# Patient Record
Sex: Male | Born: 1945 | Race: Black or African American | Hispanic: No | Marital: Married | Smoking: Former smoker
Health system: Southern US, Community
[De-identification: ages and names within clinical notes are randomized; demographics above are authoritative.]

## PROBLEM LIST (undated history)

## (undated) DIAGNOSIS — M1990 Unspecified osteoarthritis, unspecified site: Secondary | ICD-10-CM

## (undated) DIAGNOSIS — Z992 Dependence on renal dialysis: Secondary | ICD-10-CM

## (undated) DIAGNOSIS — G629 Polyneuropathy, unspecified: Secondary | ICD-10-CM

## (undated) DIAGNOSIS — H9190 Unspecified hearing loss, unspecified ear: Secondary | ICD-10-CM

## (undated) DIAGNOSIS — E119 Type 2 diabetes mellitus without complications: Secondary | ICD-10-CM

## (undated) DIAGNOSIS — D649 Anemia, unspecified: Secondary | ICD-10-CM

## (undated) DIAGNOSIS — I10 Essential (primary) hypertension: Secondary | ICD-10-CM

## (undated) DIAGNOSIS — K589 Irritable bowel syndrome without diarrhea: Secondary | ICD-10-CM

## (undated) DIAGNOSIS — N189 Chronic kidney disease, unspecified: Secondary | ICD-10-CM

## (undated) DIAGNOSIS — R0600 Dyspnea, unspecified: Secondary | ICD-10-CM

## (undated) DIAGNOSIS — I639 Cerebral infarction, unspecified: Secondary | ICD-10-CM

## (undated) DIAGNOSIS — C679 Malignant neoplasm of bladder, unspecified: Secondary | ICD-10-CM

## (undated) DIAGNOSIS — N186 End stage renal disease: Secondary | ICD-10-CM

## (undated) DIAGNOSIS — K219 Gastro-esophageal reflux disease without esophagitis: Secondary | ICD-10-CM

## (undated) DIAGNOSIS — C8590 Non-Hodgkin lymphoma, unspecified, unspecified site: Secondary | ICD-10-CM

## (undated) HISTORY — DX: Non-Hodgkin lymphoma, unspecified, unspecified site: C85.90

## (undated) HISTORY — DX: Malignant neoplasm of bladder, unspecified: C67.9

## (undated) HISTORY — DX: Chronic kidney disease, unspecified: N18.9

## (undated) HISTORY — DX: Unspecified osteoarthritis, unspecified site: M19.90

## (undated) HISTORY — PX: CIRCUMCISION: SUR203

## (undated) HISTORY — DX: Type 2 diabetes mellitus without complications: E11.9

## (undated) HISTORY — DX: Essential (primary) hypertension: I10

---

## 1997-10-09 ENCOUNTER — Ambulatory Visit (HOSPITAL_COMMUNITY): Admission: RE | Admit: 1997-10-09 | Discharge: 1997-10-09 | Payer: Self-pay | Admitting: Neurosurgery

## 1999-04-04 DIAGNOSIS — I10 Essential (primary) hypertension: Secondary | ICD-10-CM | POA: Insufficient documentation

## 1999-04-04 DIAGNOSIS — E1129 Type 2 diabetes mellitus with other diabetic kidney complication: Secondary | ICD-10-CM | POA: Insufficient documentation

## 1999-04-04 DIAGNOSIS — E1165 Type 2 diabetes mellitus with hyperglycemia: Secondary | ICD-10-CM

## 1999-10-26 DIAGNOSIS — E11319 Type 2 diabetes mellitus with unspecified diabetic retinopathy without macular edema: Secondary | ICD-10-CM

## 2005-03-08 ENCOUNTER — Ambulatory Visit: Payer: Self-pay | Admitting: Unknown Physician Specialty

## 2005-04-03 HISTORY — PX: BACK SURGERY: SHX140

## 2005-07-10 ENCOUNTER — Other Ambulatory Visit: Payer: Self-pay

## 2005-07-10 ENCOUNTER — Inpatient Hospital Stay: Payer: Self-pay | Admitting: Internal Medicine

## 2005-08-14 ENCOUNTER — Ambulatory Visit: Payer: Self-pay | Admitting: Unknown Physician Specialty

## 2005-08-22 ENCOUNTER — Inpatient Hospital Stay: Payer: Self-pay | Admitting: Unknown Physician Specialty

## 2011-12-25 DIAGNOSIS — E785 Hyperlipidemia, unspecified: Secondary | ICD-10-CM | POA: Insufficient documentation

## 2012-01-01 DIAGNOSIS — R809 Proteinuria, unspecified: Secondary | ICD-10-CM | POA: Insufficient documentation

## 2012-05-30 ENCOUNTER — Ambulatory Visit: Payer: Self-pay | Admitting: Vascular Surgery

## 2012-05-30 DIAGNOSIS — I10 Essential (primary) hypertension: Secondary | ICD-10-CM

## 2012-05-30 LAB — BASIC METABOLIC PANEL
Anion Gap: 7 (ref 7–16)
Co2: 22 mmol/L (ref 21–32)
Creatinine: 5.41 mg/dL — ABNORMAL HIGH (ref 0.60–1.30)
EGFR (Non-African Amer.): 10 — ABNORMAL LOW
Glucose: 118 mg/dL — ABNORMAL HIGH (ref 65–99)
Osmolality: 293 (ref 275–301)
Sodium: 138 mmol/L (ref 136–145)

## 2012-05-30 LAB — CBC
HGB: 10.5 g/dL — ABNORMAL LOW (ref 13.0–18.0)
MCH: 32.4 pg (ref 26.0–34.0)
MCHC: 33.4 g/dL (ref 32.0–36.0)
MCV: 97 fL (ref 80–100)
Platelet: 143 10*3/uL — ABNORMAL LOW (ref 150–440)
RBC: 3.25 10*6/uL — ABNORMAL LOW (ref 4.40–5.90)
WBC: 7 10*3/uL (ref 3.8–10.6)

## 2012-06-05 ENCOUNTER — Ambulatory Visit: Payer: Self-pay | Admitting: Vascular Surgery

## 2012-07-18 DIAGNOSIS — D649 Anemia, unspecified: Secondary | ICD-10-CM | POA: Insufficient documentation

## 2012-07-18 DIAGNOSIS — E213 Hyperparathyroidism, unspecified: Secondary | ICD-10-CM | POA: Insufficient documentation

## 2012-07-18 DIAGNOSIS — E872 Acidosis: Secondary | ICD-10-CM | POA: Insufficient documentation

## 2012-12-23 DIAGNOSIS — N186 End stage renal disease: Secondary | ICD-10-CM | POA: Insufficient documentation

## 2012-12-23 DIAGNOSIS — Z992 Dependence on renal dialysis: Secondary | ICD-10-CM

## 2013-01-01 ENCOUNTER — Ambulatory Visit: Payer: Self-pay

## 2013-01-20 ENCOUNTER — Ambulatory Visit: Payer: Self-pay

## 2013-01-20 ENCOUNTER — Ambulatory Visit: Payer: Self-pay | Admitting: Podiatry

## 2013-01-22 ENCOUNTER — Ambulatory Visit: Payer: Self-pay | Admitting: Internal Medicine

## 2013-01-30 ENCOUNTER — Ambulatory Visit: Payer: Self-pay | Admitting: Internal Medicine

## 2013-01-31 LAB — CBC CANCER CENTER
Eosinophil: 3 %
HCT: 34.6 % — ABNORMAL LOW (ref 40.0–52.0)
MCH: 33.1 pg (ref 26.0–34.0)
MCV: 101 fL — ABNORMAL HIGH (ref 80–100)
Platelet: 119 x10 3/mm — ABNORMAL LOW (ref 150–440)
RBC: 3.44 10*6/uL — ABNORMAL LOW (ref 4.40–5.90)
RDW: 15.7 % — ABNORMAL HIGH (ref 11.5–14.5)
Segmented Neutrophils: 38 %
WBC: 6.7 x10 3/mm (ref 3.8–10.6)

## 2013-01-31 LAB — LACTATE DEHYDROGENASE: LDH: 146 U/L (ref 85–241)

## 2013-01-31 LAB — IRON AND TIBC
Iron Bind.Cap.(Total): 265 ug/dL (ref 250–450)
Iron Saturation: 29 %
Unbound Iron-Bind.Cap.: 187 ug/dL

## 2013-01-31 LAB — PROTIME-INR
INR: 0.9
Prothrombin Time: 12.8 secs (ref 11.5–14.7)

## 2013-01-31 LAB — APTT: Activated PTT: 30.5 secs (ref 23.6–35.9)

## 2013-01-31 LAB — RETICULOCYTES
Absolute Retic Count: 0.1212 10*6/uL (ref 0.019–0.186)
Reticulocyte: 3.52 % — ABNORMAL HIGH (ref 0.4–3.1)

## 2013-01-31 LAB — FOLATE: Folic Acid: 9.7 ng/mL (ref 3.1–100.0)

## 2013-02-01 ENCOUNTER — Ambulatory Visit: Payer: Self-pay | Admitting: Internal Medicine

## 2013-02-05 ENCOUNTER — Encounter: Payer: Self-pay | Admitting: Podiatry

## 2013-02-05 ENCOUNTER — Ambulatory Visit (INDEPENDENT_AMBULATORY_CARE_PROVIDER_SITE_OTHER): Payer: Medicare PPO | Admitting: Podiatry

## 2013-02-05 VITALS — BP 137/67 | HR 77 | Resp 16 | Ht 71.0 in | Wt 190.0 lb

## 2013-02-05 DIAGNOSIS — L97509 Non-pressure chronic ulcer of other part of unspecified foot with unspecified severity: Secondary | ICD-10-CM

## 2013-02-05 DIAGNOSIS — L97511 Non-pressure chronic ulcer of other part of right foot limited to breakdown of skin: Secondary | ICD-10-CM

## 2013-02-05 DIAGNOSIS — E1169 Type 2 diabetes mellitus with other specified complication: Secondary | ICD-10-CM

## 2013-02-05 NOTE — Progress Notes (Signed)
Mark [name] presents today for followup of the ulceration is chronic in nature sub-fifth metatarsal head of the left foot. He is also concerned about skin breakdown to the hallux distally on the right foot. He also like a suture of his nails. He has a history of diabetes as well as vascular disease and dropfoot.  Objective: Vital signs are stable he is alert and oriented x3 pulses are palpable bilateral. Superficial ulceration proximally a centimeter in diameter distal aspect of the hallux right. It appears to be a little serrated to the dermis there is mild drainage but appears to be healing quite nicely. There is no signs of infection in this site. Contralateral foot does demonstrate superficial ulceration with reactive hyperkeratosis to the plantar aspect of the substance metatarsal head of the left foot. Upon debridement of this is noted to he did have some drainage some malodor but did not probe deep. I resected all necrotic tissue the bleeding. We also debrided his nails which are thick yellow dystrophic and clinically mycotic.  Assessment: Diabetic with renal failure. Peripheral vascular disease and neuropathy. Ulceration distal aspect hallux right grade 1. Grade 2 ulceration sub-fifth metatarsal head left foot.  Plan: We discussed etiology pathology conservative versus surgical therapies. He would like [place] to refer him to the wound care clinic. We debrided his nails 1 through 5 bilateral is cover service secondary to pain. We debrided all ulcerations to bleeding. He currently he will continue conservative therapies and wound dressing to the fifth metatarsal left foot but should going to heal quite nicely. He will cover the hallux right daily with Bactroban ointment and a dry sterile dressing. I will followup with him in 2 months.

## 2013-02-10 ENCOUNTER — Encounter: Payer: Self-pay | Admitting: Cardiothoracic Surgery

## 2013-02-15 LAB — WOUND AEROBIC CULTURE

## 2013-02-17 ENCOUNTER — Ambulatory Visit: Payer: Self-pay | Admitting: Gastroenterology

## 2013-02-17 ENCOUNTER — Ambulatory Visit: Payer: Self-pay

## 2013-02-17 LAB — BASIC METABOLIC PANEL
BUN: 33 mg/dL — ABNORMAL HIGH (ref 7–18)
Calcium, Total: 9 mg/dL (ref 8.5–10.1)
Co2: 31 mmol/L (ref 21–32)
Creatinine: 5.96 mg/dL — ABNORMAL HIGH (ref 0.60–1.30)
EGFR (Non-African Amer.): 9 — ABNORMAL LOW
Glucose: 156 mg/dL — ABNORMAL HIGH (ref 65–99)
Osmolality: 286 (ref 275–301)
Potassium: 4.3 mmol/L (ref 3.5–5.1)
Sodium: 138 mmol/L (ref 136–145)

## 2013-02-21 LAB — WOUND AEROBIC CULTURE

## 2013-03-03 ENCOUNTER — Ambulatory Visit: Payer: Self-pay | Admitting: Internal Medicine

## 2013-03-03 ENCOUNTER — Encounter: Payer: Self-pay | Admitting: Cardiothoracic Surgery

## 2013-03-05 LAB — CBC CANCER CENTER
HCT: 32 % — ABNORMAL LOW (ref 40.0–52.0)
Lymphocytes: 46 %
MCH: 33.9 pg (ref 26.0–34.0)
MCV: 101 fL — ABNORMAL HIGH (ref 80–100)
RBC: 3.16 10*6/uL — ABNORMAL LOW (ref 4.40–5.90)
RDW: 14.2 % (ref 11.5–14.5)
Segmented Neutrophils: 50 %
WBC: 4.5 x10 3/mm (ref 3.8–10.6)

## 2013-03-12 LAB — CBC CANCER CENTER
Basophil %: 0.6 %
Eosinophil #: 0.1 x10 3/mm (ref 0.0–0.7)
HCT: 32.1 % — ABNORMAL LOW (ref 40.0–52.0)
HGB: 10.6 g/dL — ABNORMAL LOW (ref 13.0–18.0)
Lymphocyte #: 2.8 x10 3/mm (ref 1.0–3.6)
Lymphocyte %: 59.6 %
MCH: 33.1 pg (ref 26.0–34.0)
MCV: 101 fL — ABNORMAL HIGH (ref 80–100)
Monocyte #: 0.1 x10 3/mm — ABNORMAL LOW (ref 0.2–1.0)
Monocyte %: 2.3 %
Platelet: 91 x10 3/mm — ABNORMAL LOW (ref 150–440)
RDW: 13.9 % (ref 11.5–14.5)
WBC: 4.7 x10 3/mm (ref 3.8–10.6)

## 2013-03-24 LAB — CBC CANCER CENTER
Basophil %: 0.3 %
Eosinophil %: 1.4 %
HCT: 31.7 % — ABNORMAL LOW (ref 40.0–52.0)
HGB: 10.4 g/dL — ABNORMAL LOW (ref 13.0–18.0)
Lymphocyte #: 0.7 x10 3/mm — ABNORMAL LOW (ref 1.0–3.6)
MCH: 32.7 pg (ref 26.0–34.0)
MCHC: 32.9 g/dL (ref 32.0–36.0)
MCV: 99 fL (ref 80–100)
Monocyte #: 0.1 x10 3/mm — ABNORMAL LOW (ref 0.2–1.0)
Monocyte %: 1.3 %
Neutrophil #: 7.2 x10 3/mm — ABNORMAL HIGH (ref 1.4–6.5)
RBC: 3.19 10*6/uL — ABNORMAL LOW (ref 4.40–5.90)
RDW: 13.6 % (ref 11.5–14.5)

## 2013-03-31 LAB — CBC CANCER CENTER
Basophil %: 1.2 %
Eosinophil #: 0.1 x10 3/mm (ref 0.0–0.7)
HGB: 9.1 g/dL — ABNORMAL LOW (ref 13.0–18.0)
Lymphocyte %: 20.2 %
MCH: 33 pg (ref 26.0–34.0)
MCV: 100 fL (ref 80–100)
Monocyte #: 0.1 x10 3/mm — ABNORMAL LOW (ref 0.2–1.0)
Neutrophil #: 2.7 x10 3/mm (ref 1.4–6.5)
Platelet: 63 x10 3/mm — ABNORMAL LOW (ref 150–440)
RDW: 13.7 % (ref 11.5–14.5)
WBC: 3.6 x10 3/mm — ABNORMAL LOW (ref 3.8–10.6)

## 2013-04-03 ENCOUNTER — Ambulatory Visit: Payer: Self-pay | Admitting: Internal Medicine

## 2013-04-08 LAB — CBC CANCER CENTER
Basophil #: 0 x10 3/mm (ref 0.0–0.1)
Basophil %: 0.7 %
EOS ABS: 0.2 x10 3/mm (ref 0.0–0.7)
Eosinophil %: 5 %
HCT: 25.7 % — ABNORMAL LOW (ref 40.0–52.0)
HGB: 8.6 g/dL — AB (ref 13.0–18.0)
LYMPHS ABS: 0.8 x10 3/mm — AB (ref 1.0–3.6)
LYMPHS PCT: 20.3 %
MCH: 32.9 pg (ref 26.0–34.0)
MCHC: 33.5 g/dL (ref 32.0–36.0)
MCV: 98 fL (ref 80–100)
MONO ABS: 0.1 x10 3/mm — AB (ref 0.2–1.0)
Monocyte %: 3.3 %
NEUTROS PCT: 70.7 %
Neutrophil #: 2.8 x10 3/mm (ref 1.4–6.5)
PLATELETS: 195 x10 3/mm (ref 150–440)
RBC: 2.62 10*6/uL — ABNORMAL LOW (ref 4.40–5.90)
RDW: 13.5 % (ref 11.5–14.5)
WBC: 3.9 x10 3/mm (ref 3.8–10.6)

## 2013-04-08 LAB — URIC ACID: Uric Acid: 5.8 mg/dL (ref 3.5–7.2)

## 2013-04-08 LAB — HEPATIC FUNCTION PANEL A (ARMC)
ALBUMIN: 3.3 g/dL — AB (ref 3.4–5.0)
ALK PHOS: 74 U/L
ALT: 15 U/L (ref 12–78)
AST: 14 U/L — AB (ref 15–37)
BILIRUBIN DIRECT: 0.1 mg/dL (ref 0.00–0.20)
Bilirubin,Total: 0.3 mg/dL (ref 0.2–1.0)
TOTAL PROTEIN: 6.6 g/dL (ref 6.4–8.2)

## 2013-04-08 LAB — POTASSIUM: POTASSIUM: 4.4 mmol/L (ref 3.5–5.1)

## 2013-04-14 LAB — CBC CANCER CENTER
Basophil #: 0.1 x10 3/mm (ref 0.0–0.1)
Basophil %: 1.8 %
Eosinophil #: 0.2 x10 3/mm (ref 0.0–0.7)
Eosinophil %: 4.2 %
HCT: 25.7 % — ABNORMAL LOW (ref 40.0–52.0)
HGB: 8.7 g/dL — ABNORMAL LOW (ref 13.0–18.0)
Lymphocyte #: 1 x10 3/mm (ref 1.0–3.6)
Lymphocyte %: 26.1 %
MCH: 33 pg (ref 26.0–34.0)
MCHC: 33.8 g/dL (ref 32.0–36.0)
MCV: 97 fL (ref 80–100)
MONOS PCT: 8.5 %
Monocyte #: 0.3 x10 3/mm (ref 0.2–1.0)
Neutrophil #: 2.3 x10 3/mm (ref 1.4–6.5)
Neutrophil %: 59.4 %
PLATELETS: 198 x10 3/mm (ref 150–440)
RBC: 2.63 10*6/uL — ABNORMAL LOW (ref 4.40–5.90)
RDW: 13.8 % (ref 11.5–14.5)
WBC: 3.9 x10 3/mm (ref 3.8–10.6)

## 2013-04-21 ENCOUNTER — Ambulatory Visit: Payer: Medicare PPO | Admitting: Podiatry

## 2013-04-28 LAB — CBC CANCER CENTER
BASOS ABS: 0 x10 3/mm (ref 0.0–0.1)
BASOS PCT: 0.9 %
Eosinophil #: 0.3 x10 3/mm (ref 0.0–0.7)
Eosinophil %: 5.5 %
HCT: 28.4 % — ABNORMAL LOW (ref 40.0–52.0)
HGB: 9.5 g/dL — AB (ref 13.0–18.0)
LYMPHS ABS: 1.4 x10 3/mm (ref 1.0–3.6)
LYMPHS PCT: 30.9 %
MCH: 32.2 pg (ref 26.0–34.0)
MCHC: 33.5 g/dL (ref 32.0–36.0)
MCV: 96 fL (ref 80–100)
Monocyte #: 0.4 x10 3/mm (ref 0.2–1.0)
Monocyte %: 9.4 %
Neutrophil #: 2.5 x10 3/mm (ref 1.4–6.5)
Neutrophil %: 53.3 %
Platelet: 102 x10 3/mm — ABNORMAL LOW (ref 150–440)
RBC: 2.95 10*6/uL — ABNORMAL LOW (ref 4.40–5.90)
RDW: 13.9 % (ref 11.5–14.5)
WBC: 4.6 x10 3/mm (ref 3.8–10.6)

## 2013-05-04 ENCOUNTER — Ambulatory Visit: Payer: Self-pay | Admitting: Internal Medicine

## 2013-05-06 ENCOUNTER — Inpatient Hospital Stay: Payer: Self-pay | Admitting: Internal Medicine

## 2013-05-06 LAB — COMPREHENSIVE METABOLIC PANEL
AST: 36 U/L (ref 15–37)
Albumin: 3.6 g/dL (ref 3.4–5.0)
Alkaline Phosphatase: 198 U/L — ABNORMAL HIGH
Anion Gap: 6 — ABNORMAL LOW (ref 7–16)
BILIRUBIN TOTAL: 0.9 mg/dL (ref 0.2–1.0)
BUN: 19 mg/dL — ABNORMAL HIGH (ref 7–18)
CHLORIDE: 97 mmol/L — AB (ref 98–107)
Calcium, Total: 9.2 mg/dL (ref 8.5–10.1)
Co2: 34 mmol/L — ABNORMAL HIGH (ref 21–32)
Creatinine: 3.14 mg/dL — ABNORMAL HIGH (ref 0.60–1.30)
EGFR (Non-African Amer.): 19 — ABNORMAL LOW
GFR CALC AF AMER: 22 — AB
GLUCOSE: 146 mg/dL — AB (ref 65–99)
Osmolality: 279 (ref 275–301)
Potassium: 4 mmol/L (ref 3.5–5.1)
SGPT (ALT): 27 U/L (ref 12–78)
SODIUM: 137 mmol/L (ref 136–145)
TOTAL PROTEIN: 7.1 g/dL (ref 6.4–8.2)

## 2013-05-06 LAB — CBC
HCT: 28.1 % — ABNORMAL LOW (ref 40.0–52.0)
HGB: 9.2 g/dL — ABNORMAL LOW (ref 13.0–18.0)
MCH: 31.7 pg (ref 26.0–34.0)
MCHC: 32.9 g/dL (ref 32.0–36.0)
MCV: 96 fL (ref 80–100)
PLATELETS: 148 10*3/uL — AB (ref 150–440)
RBC: 2.91 10*6/uL — ABNORMAL LOW (ref 4.40–5.90)
RDW: 14 % (ref 11.5–14.5)
WBC: 28.5 10*3/uL — AB (ref 3.8–10.6)

## 2013-05-06 LAB — TROPONIN I: Troponin-I: 0.04 ng/mL

## 2013-05-07 LAB — BASIC METABOLIC PANEL
Anion Gap: 6 — ABNORMAL LOW (ref 7–16)
BUN: 28 mg/dL — ABNORMAL HIGH (ref 7–18)
CHLORIDE: 97 mmol/L — AB (ref 98–107)
CREATININE: 4.48 mg/dL — AB (ref 0.60–1.30)
Calcium, Total: 8.3 mg/dL — ABNORMAL LOW (ref 8.5–10.1)
Co2: 34 mmol/L — ABNORMAL HIGH (ref 21–32)
GFR CALC AF AMER: 15 — AB
GFR CALC NON AF AMER: 13 — AB
GLUCOSE: 234 mg/dL — AB (ref 65–99)
Osmolality: 287 (ref 275–301)
Potassium: 4.2 mmol/L (ref 3.5–5.1)
Sodium: 137 mmol/L (ref 136–145)

## 2013-05-07 LAB — PHOSPHORUS: PHOSPHORUS: 2.6 mg/dL (ref 2.5–4.9)

## 2013-05-07 LAB — CBC WITH DIFFERENTIAL/PLATELET
BASOS PCT: 0.2 %
Basophil #: 0.1 10*3/uL (ref 0.0–0.1)
EOS ABS: 0.2 10*3/uL (ref 0.0–0.7)
EOS PCT: 0.9 %
HCT: 24.1 % — ABNORMAL LOW (ref 40.0–52.0)
HGB: 7.8 g/dL — ABNORMAL LOW (ref 13.0–18.0)
Lymphocyte #: 0.8 10*3/uL — ABNORMAL LOW (ref 1.0–3.6)
Lymphocyte %: 3.6 %
MCH: 31.4 pg (ref 26.0–34.0)
MCHC: 32.3 g/dL (ref 32.0–36.0)
MCV: 97 fL (ref 80–100)
Monocyte #: 1.7 x10 3/mm — ABNORMAL HIGH (ref 0.2–1.0)
Monocyte %: 7.7 %
NEUTROS ABS: 19.6 10*3/uL — AB (ref 1.4–6.5)
Neutrophil %: 87.6 %
Platelet: 117 10*3/uL — ABNORMAL LOW (ref 150–440)
RBC: 2.48 10*6/uL — ABNORMAL LOW (ref 4.40–5.90)
RDW: 14.2 % (ref 11.5–14.5)
WBC: 22.4 10*3/uL — ABNORMAL HIGH (ref 3.8–10.6)

## 2013-05-08 LAB — CBC WITH DIFFERENTIAL/PLATELET
Basophil #: 0.1 10*3/uL (ref 0.0–0.1)
Basophil %: 0.4 %
Eosinophil #: 0.3 10*3/uL (ref 0.0–0.7)
Eosinophil %: 2.3 %
HCT: 20.4 % — ABNORMAL LOW (ref 40.0–52.0)
HGB: 7 g/dL — ABNORMAL LOW (ref 13.0–18.0)
Lymphocyte #: 0.8 10*3/uL — ABNORMAL LOW (ref 1.0–3.6)
Lymphocyte %: 6.3 %
MCH: 33.3 pg (ref 26.0–34.0)
MCHC: 34.4 g/dL (ref 32.0–36.0)
MCV: 97 fL (ref 80–100)
Monocyte #: 1.2 x10 3/mm — ABNORMAL HIGH (ref 0.2–1.0)
Monocyte %: 9.6 %
Neutrophil #: 10.6 10*3/uL — ABNORMAL HIGH (ref 1.4–6.5)
Neutrophil %: 81.4 %
Platelet: 97 10*3/uL — ABNORMAL LOW (ref 150–440)
RBC: 2.11 10*6/uL — ABNORMAL LOW (ref 4.40–5.90)
RDW: 14.1 % (ref 11.5–14.5)
WBC: 13 10*3/uL — ABNORMAL HIGH (ref 3.8–10.6)

## 2013-05-08 LAB — PHOSPHORUS: Phosphorus: 2.6 mg/dL (ref 2.5–4.9)

## 2013-05-09 LAB — CBC WITH DIFFERENTIAL/PLATELET
Basophil #: 0.1 10*3/uL (ref 0.0–0.1)
Basophil %: 0.5 %
Eosinophil #: 0.4 10*3/uL (ref 0.0–0.7)
Eosinophil %: 2.7 %
HCT: 27.5 % — AB (ref 40.0–52.0)
HGB: 9.6 g/dL — ABNORMAL LOW (ref 13.0–18.0)
Lymphocyte #: 0.6 10*3/uL — ABNORMAL LOW (ref 1.0–3.6)
Lymphocyte %: 4.3 %
MCH: 32.6 pg (ref 26.0–34.0)
MCHC: 34.8 g/dL (ref 32.0–36.0)
MCV: 94 fL (ref 80–100)
MONO ABS: 0.9 x10 3/mm (ref 0.2–1.0)
MONOS PCT: 6.3 %
NEUTROS ABS: 11.8 10*3/uL — AB (ref 1.4–6.5)
Neutrophil %: 86.2 %
PLATELETS: 90 10*3/uL — AB (ref 150–440)
RBC: 2.93 10*6/uL — ABNORMAL LOW (ref 4.40–5.90)
RDW: 15 % — ABNORMAL HIGH (ref 11.5–14.5)
WBC: 13.7 10*3/uL — AB (ref 3.8–10.6)

## 2013-05-11 LAB — CULTURE, BLOOD (SINGLE)

## 2013-05-13 LAB — CBC CANCER CENTER
BASOS PCT: 0.5 %
Basophil #: 0.1 x10 3/mm (ref 0.0–0.1)
EOS PCT: 2.2 %
Eosinophil #: 0.3 x10 3/mm (ref 0.0–0.7)
HCT: 30.5 % — ABNORMAL LOW (ref 40.0–52.0)
HGB: 10.1 g/dL — AB (ref 13.0–18.0)
Lymphocyte #: 0.8 x10 3/mm — ABNORMAL LOW (ref 1.0–3.6)
Lymphocyte %: 6.5 %
MCH: 31.2 pg (ref 26.0–34.0)
MCHC: 33.2 g/dL (ref 32.0–36.0)
MCV: 94 fL (ref 80–100)
MONO ABS: 0.6 x10 3/mm (ref 0.2–1.0)
Monocyte %: 4.6 %
NEUTROS ABS: 10.3 x10 3/mm — AB (ref 1.4–6.5)
Neutrophil %: 86.2 %
Platelet: 159 x10 3/mm (ref 150–440)
RBC: 3.25 10*6/uL — ABNORMAL LOW (ref 4.40–5.90)
RDW: 14.6 % — ABNORMAL HIGH (ref 11.5–14.5)
WBC: 12 x10 3/mm — ABNORMAL HIGH (ref 3.8–10.6)

## 2013-05-21 ENCOUNTER — Ambulatory Visit: Payer: Medicare PPO | Admitting: Podiatry

## 2013-05-23 LAB — HEPATIC FUNCTION PANEL A (ARMC)
ALBUMIN: 3.9 g/dL (ref 3.4–5.0)
Alkaline Phosphatase: 84 U/L
BILIRUBIN DIRECT: 0.1 mg/dL (ref 0.00–0.20)
BILIRUBIN TOTAL: 0.3 mg/dL (ref 0.2–1.0)
SGOT(AST): 25 U/L (ref 15–37)
SGPT (ALT): 43 U/L (ref 12–78)
Total Protein: 6.9 g/dL (ref 6.4–8.2)

## 2013-05-23 LAB — CBC CANCER CENTER
BASOS ABS: 0.1 x10 3/mm (ref 0.0–0.1)
BASOS PCT: 1.1 %
Eosinophil #: 0.4 x10 3/mm (ref 0.0–0.7)
Eosinophil %: 7.3 %
HCT: 35.8 % — ABNORMAL LOW (ref 40.0–52.0)
HGB: 11.7 g/dL — ABNORMAL LOW (ref 13.0–18.0)
Lymphocyte #: 0.9 x10 3/mm — ABNORMAL LOW (ref 1.0–3.6)
Lymphocyte %: 16.7 %
MCH: 30.7 pg (ref 26.0–34.0)
MCHC: 32.7 g/dL (ref 32.0–36.0)
MCV: 94 fL (ref 80–100)
Monocyte #: 0.7 x10 3/mm (ref 0.2–1.0)
Monocyte %: 12.6 %
NEUTROS ABS: 3.6 x10 3/mm (ref 1.4–6.5)
NEUTROS PCT: 62.3 %
Platelet: 135 x10 3/mm — ABNORMAL LOW (ref 150–440)
RBC: 3.81 10*6/uL — ABNORMAL LOW (ref 4.40–5.90)
RDW: 14.5 % (ref 11.5–14.5)
WBC: 5.7 x10 3/mm (ref 3.8–10.6)

## 2013-05-23 LAB — URIC ACID: Uric Acid: 4.7 mg/dL (ref 3.5–7.2)

## 2013-05-23 LAB — POTASSIUM: Potassium: 5 mmol/L (ref 3.5–5.1)

## 2013-05-26 LAB — CBC CANCER CENTER
Basophil #: 0 x10 3/mm (ref 0.0–0.1)
Basophil %: 0.6 %
EOS ABS: 0.5 x10 3/mm (ref 0.0–0.7)
EOS PCT: 10.2 %
HCT: 32.9 % — ABNORMAL LOW (ref 40.0–52.0)
HGB: 10.9 g/dL — ABNORMAL LOW (ref 13.0–18.0)
Lymphocyte #: 0.8 x10 3/mm — ABNORMAL LOW (ref 1.0–3.6)
Lymphocyte %: 15 %
MCH: 30.9 pg (ref 26.0–34.0)
MCHC: 33.1 g/dL (ref 32.0–36.0)
MCV: 93 fL (ref 80–100)
Monocyte #: 0.6 x10 3/mm (ref 0.2–1.0)
Monocyte %: 11.9 %
NEUTROS ABS: 3.3 x10 3/mm (ref 1.4–6.5)
Neutrophil %: 62.3 %
Platelet: 107 x10 3/mm — ABNORMAL LOW (ref 150–440)
RBC: 3.53 10*6/uL — ABNORMAL LOW (ref 4.40–5.90)
RDW: 14.3 % (ref 11.5–14.5)
WBC: 5.3 x10 3/mm (ref 3.8–10.6)

## 2013-06-01 ENCOUNTER — Ambulatory Visit: Payer: Self-pay | Admitting: Internal Medicine

## 2013-06-24 LAB — CBC CANCER CENTER
Basophil #: 0 x10 3/mm (ref 0.0–0.1)
Basophil %: 0.7 %
EOS PCT: 5.8 %
Eosinophil #: 0.2 x10 3/mm (ref 0.0–0.7)
HCT: 31.3 % — ABNORMAL LOW (ref 40.0–52.0)
HGB: 10.5 g/dL — AB (ref 13.0–18.0)
Lymphocyte #: 1.1 x10 3/mm (ref 1.0–3.6)
Lymphocyte %: 32.3 %
MCH: 31.3 pg (ref 26.0–34.0)
MCHC: 33.6 g/dL (ref 32.0–36.0)
MCV: 93 fL (ref 80–100)
MONO ABS: 0.4 x10 3/mm (ref 0.2–1.0)
Monocyte %: 12.5 %
NEUTROS ABS: 1.6 x10 3/mm (ref 1.4–6.5)
Neutrophil %: 48.7 %
Platelet: 118 x10 3/mm — ABNORMAL LOW (ref 150–440)
RBC: 3.36 10*6/uL — AB (ref 4.40–5.90)
RDW: 15.1 % — ABNORMAL HIGH (ref 11.5–14.5)
WBC: 3.3 x10 3/mm — ABNORMAL LOW (ref 3.8–10.6)

## 2013-07-02 ENCOUNTER — Ambulatory Visit: Payer: Self-pay | Admitting: Internal Medicine

## 2013-07-02 ENCOUNTER — Encounter: Payer: Self-pay | Admitting: Podiatry

## 2013-07-02 ENCOUNTER — Ambulatory Visit (INDEPENDENT_AMBULATORY_CARE_PROVIDER_SITE_OTHER): Payer: Medicare PPO | Admitting: Podiatry

## 2013-07-02 VITALS — BP 118/64 | HR 60 | Resp 18

## 2013-07-02 DIAGNOSIS — E1169 Type 2 diabetes mellitus with other specified complication: Secondary | ICD-10-CM

## 2013-07-02 DIAGNOSIS — B351 Tinea unguium: Secondary | ICD-10-CM

## 2013-07-02 NOTE — Progress Notes (Signed)
He presents today with a chief complaint of painful elongated toenails. He denies fever chills nausea vomiting muscle aches and pains.  Objective: Pulses are strongly palpable bilateral. Nails are thick yellow dystrophic with mycotic and painful palpation.  Assessment: Pain in limb secondary to onychomycosis 1 through 5 bilateral.  Plan: Debridement of nails 1 through 5 bilateral covered service secondary to pain.

## 2013-07-21 LAB — CBC CANCER CENTER
BASOS ABS: 0 x10 3/mm (ref 0.0–0.1)
Basophil %: 0.7 %
EOS PCT: 5.4 %
Eosinophil #: 0.3 x10 3/mm (ref 0.0–0.7)
HCT: 31.3 % — ABNORMAL LOW (ref 40.0–52.0)
HGB: 10.5 g/dL — AB (ref 13.0–18.0)
LYMPHS ABS: 1.3 x10 3/mm (ref 1.0–3.6)
LYMPHS PCT: 24.8 %
MCH: 31.8 pg (ref 26.0–34.0)
MCHC: 33.7 g/dL (ref 32.0–36.0)
MCV: 94 fL (ref 80–100)
MONO ABS: 0.6 x10 3/mm (ref 0.2–1.0)
Monocyte %: 12 %
Neutrophil #: 3 x10 3/mm (ref 1.4–6.5)
Neutrophil %: 57.1 %
PLATELETS: 131 x10 3/mm — AB (ref 150–440)
RBC: 3.32 10*6/uL — AB (ref 4.40–5.90)
RDW: 15.6 % — AB (ref 11.5–14.5)
WBC: 5.2 x10 3/mm (ref 3.8–10.6)

## 2013-08-01 ENCOUNTER — Ambulatory Visit: Payer: Self-pay | Admitting: Internal Medicine

## 2013-08-18 DIAGNOSIS — C9590 Leukemia, unspecified not having achieved remission: Secondary | ICD-10-CM | POA: Insufficient documentation

## 2013-08-18 DIAGNOSIS — H359 Unspecified retinal disorder: Secondary | ICD-10-CM | POA: Insufficient documentation

## 2013-08-18 DIAGNOSIS — Z992 Dependence on renal dialysis: Secondary | ICD-10-CM | POA: Insufficient documentation

## 2013-08-18 DIAGNOSIS — R031 Nonspecific low blood-pressure reading: Secondary | ICD-10-CM | POA: Insufficient documentation

## 2013-08-18 DIAGNOSIS — R0602 Shortness of breath: Secondary | ICD-10-CM | POA: Insufficient documentation

## 2013-08-18 LAB — CBC CANCER CENTER
BASOS ABS: 0 x10 3/mm (ref 0.0–0.1)
Basophil %: 0.7 %
EOS ABS: 0.2 x10 3/mm (ref 0.0–0.7)
Eosinophil %: 5.5 %
HCT: 28.8 % — ABNORMAL LOW (ref 40.0–52.0)
HGB: 10.3 g/dL — ABNORMAL LOW (ref 13.0–18.0)
LYMPHS ABS: 1.1 x10 3/mm (ref 1.0–3.6)
LYMPHS PCT: 30.7 %
MCH: 33.1 pg (ref 26.0–34.0)
MCHC: 35.8 g/dL (ref 32.0–36.0)
MCV: 92 fL (ref 80–100)
MONO ABS: 0.5 x10 3/mm (ref 0.2–1.0)
MONOS PCT: 13.1 %
NEUTROS ABS: 1.8 x10 3/mm (ref 1.4–6.5)
NEUTROS PCT: 50 %
PLATELETS: 145 x10 3/mm — AB (ref 150–440)
RBC: 3.11 10*6/uL — ABNORMAL LOW (ref 4.40–5.90)
RDW: 15.3 % — AB (ref 11.5–14.5)
WBC: 3.6 x10 3/mm — ABNORMAL LOW (ref 3.8–10.6)

## 2013-08-20 ENCOUNTER — Ambulatory Visit: Payer: Self-pay | Admitting: Urology

## 2013-08-20 LAB — CBC
HCT: 29.5 % — ABNORMAL LOW (ref 40.0–52.0)
HGB: 10.4 g/dL — AB (ref 13.0–18.0)
MCH: 33.8 pg (ref 26.0–34.0)
MCHC: 35.2 g/dL (ref 32.0–36.0)
MCV: 96 fL (ref 80–100)
Platelet: 162 10*3/uL (ref 150–440)
RBC: 3.08 10*6/uL — ABNORMAL LOW (ref 4.40–5.90)
RDW: 15 % — ABNORMAL HIGH (ref 11.5–14.5)
WBC: 4.5 10*3/uL (ref 3.8–10.6)

## 2013-08-20 LAB — BASIC METABOLIC PANEL
ANION GAP: 9 (ref 7–16)
BUN: 53 mg/dL — ABNORMAL HIGH (ref 7–18)
CALCIUM: 9.5 mg/dL (ref 8.5–10.1)
CO2: 33 mmol/L — AB (ref 21–32)
Chloride: 94 mmol/L — ABNORMAL LOW (ref 98–107)
Creatinine: 5.5 mg/dL — ABNORMAL HIGH (ref 0.60–1.30)
EGFR (African American): 11 — ABNORMAL LOW
GFR CALC NON AF AMER: 10 — AB
GLUCOSE: 321 mg/dL — AB (ref 65–99)
OSMOLALITY: 299 (ref 275–301)
POTASSIUM: 4.4 mmol/L (ref 3.5–5.1)
SODIUM: 136 mmol/L (ref 136–145)

## 2013-08-29 ENCOUNTER — Ambulatory Visit: Payer: Self-pay | Admitting: Urology

## 2013-09-01 ENCOUNTER — Ambulatory Visit: Payer: Self-pay | Admitting: Internal Medicine

## 2013-09-02 LAB — PATHOLOGY REPORT

## 2013-09-16 LAB — CBC CANCER CENTER
BASOS ABS: 0.1 x10 3/mm (ref 0.0–0.1)
Basophil %: 1.1 %
EOS ABS: 0.2 x10 3/mm (ref 0.0–0.7)
Eosinophil %: 4.1 %
HCT: 30.3 % — AB (ref 40.0–52.0)
HGB: 10.6 g/dL — AB (ref 13.0–18.0)
Lymphocyte #: 1.5 x10 3/mm (ref 1.0–3.6)
Lymphocyte %: 28.7 %
MCH: 33.8 pg (ref 26.0–34.0)
MCHC: 35 g/dL (ref 32.0–36.0)
MCV: 97 fL (ref 80–100)
MONOS PCT: 8.8 %
Monocyte #: 0.5 x10 3/mm (ref 0.2–1.0)
NEUTROS ABS: 3 x10 3/mm (ref 1.4–6.5)
Neutrophil %: 57.3 %
Platelet: 147 x10 3/mm — ABNORMAL LOW (ref 150–440)
RBC: 3.14 10*6/uL — ABNORMAL LOW (ref 4.40–5.90)
RDW: 14.4 % (ref 11.5–14.5)
WBC: 5.3 x10 3/mm (ref 3.8–10.6)

## 2013-09-16 LAB — LACTATE DEHYDROGENASE: LDH: 197 U/L (ref 85–241)

## 2013-09-16 LAB — HEPATIC FUNCTION PANEL A (ARMC)
ALBUMIN: 4.1 g/dL (ref 3.4–5.0)
ALT: 44 U/L (ref 12–78)
Alkaline Phosphatase: 98 U/L
BILIRUBIN DIRECT: 0.1 mg/dL (ref 0.00–0.20)
Bilirubin,Total: 0.3 mg/dL (ref 0.2–1.0)
SGOT(AST): 12 U/L — ABNORMAL LOW (ref 15–37)
TOTAL PROTEIN: 7 g/dL (ref 6.4–8.2)

## 2013-09-16 LAB — CREATININE, SERUM
CREATININE: 5.61 mg/dL — AB (ref 0.60–1.30)
EGFR (African American): 11 — ABNORMAL LOW
EGFR (Non-African Amer.): 10 — ABNORMAL LOW

## 2013-10-01 ENCOUNTER — Ambulatory Visit (INDEPENDENT_AMBULATORY_CARE_PROVIDER_SITE_OTHER): Payer: Medicare PPO | Admitting: Podiatry

## 2013-10-01 ENCOUNTER — Ambulatory Visit: Payer: Self-pay | Admitting: Internal Medicine

## 2013-10-01 VITALS — BP 129/62 | HR 59 | Resp 16

## 2013-10-01 DIAGNOSIS — M79676 Pain in unspecified toe(s): Secondary | ICD-10-CM

## 2013-10-01 DIAGNOSIS — L97509 Non-pressure chronic ulcer of other part of unspecified foot with unspecified severity: Secondary | ICD-10-CM

## 2013-10-01 DIAGNOSIS — M79609 Pain in unspecified limb: Secondary | ICD-10-CM

## 2013-10-01 DIAGNOSIS — B351 Tinea unguium: Secondary | ICD-10-CM

## 2013-10-01 DIAGNOSIS — E1169 Type 2 diabetes mellitus with other specified complication: Secondary | ICD-10-CM

## 2013-10-01 MED ORDER — MUPIROCIN 2 % EX OINT: TOPICAL_OINTMENT | CUTANEOUS | Status: DC

## 2013-10-02 NOTE — Progress Notes (Signed)
He presents today for a followup of his ulceration plantar aspect of his fifth metatarsal head of his left foot. Also to have his nails debridement or exquisitely painful he says.  Objective: Vital signs are stable he is alert and oriented x3. His nails are thick yellow dystrophic with mycotic. Ulceration sub-fifth has healed over with hyperkeratosis however was debrided does reveal a superficial ulceration the does not appear to be clinically infected at this point in time.  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 Pain in limb secondary to onychomycosis and ulceration bilateral.  Plan: Debridement of his ulceration to bleeding today and place a dry sterile compressive dressing. He will once again continue his conservative therapies as soaking regimen for his left foot. Nails were debridement 1 through 5 bilateral is cover service I will followup with him in 3 months. Should his foot become more painful or red or swollen he is to notify us immediately.

## 2013-10-08 ENCOUNTER — Telehealth: Payer: Self-pay | Admitting: *Deleted

## 2013-10-08 NOTE — Telephone Encounter (Signed)
[facility] CALLED WANTING LAST MEDICAL RECORD FOR PT. SENT OVER TO [phone]

## 2013-10-15 ENCOUNTER — Inpatient Hospital Stay: Payer: Self-pay | Admitting: Internal Medicine

## 2013-10-15 LAB — BASIC METABOLIC PANEL
Anion Gap: 7 (ref 7–16)
BUN: 15 mg/dL (ref 7–18)
CALCIUM: 9.2 mg/dL (ref 8.5–10.1)
CHLORIDE: 97 mmol/L — AB (ref 98–107)
Co2: 33 mmol/L — ABNORMAL HIGH (ref 21–32)
Creatinine: 3.09 mg/dL — ABNORMAL HIGH (ref 0.60–1.30)
EGFR (Non-African Amer.): 20 — ABNORMAL LOW
GFR CALC AF AMER: 23 — AB
Glucose: 135 mg/dL — ABNORMAL HIGH (ref 65–99)
OSMOLALITY: 277 (ref 275–301)
Potassium: 3.5 mmol/L (ref 3.5–5.1)
SODIUM: 137 mmol/L (ref 136–145)

## 2013-10-15 LAB — CBC WITH DIFFERENTIAL/PLATELET
Basophil #: 0 10*3/uL (ref 0.0–0.1)
Basophil %: 0.9 %
Eosinophil #: 0.3 10*3/uL (ref 0.0–0.7)
Eosinophil %: 5.8 %
HCT: 31.1 % — ABNORMAL LOW (ref 40.0–52.0)
HGB: 10.8 g/dL — ABNORMAL LOW (ref 13.0–18.0)
Lymphocyte #: 1.1 10*3/uL (ref 1.0–3.6)
Lymphocyte %: 25.3 %
MCH: 33.7 pg (ref 26.0–34.0)
MCHC: 34.7 g/dL (ref 32.0–36.0)
MCV: 97 fL (ref 80–100)
Monocyte #: 0.6 x10 3/mm (ref 0.2–1.0)
Monocyte %: 12.9 %
Neutrophil #: 2.5 10*3/uL (ref 1.4–6.5)
Neutrophil %: 55.1 %
Platelet: 188 10*3/uL (ref 150–440)
RBC: 3.21 10*6/uL — AB (ref 4.40–5.90)
RDW: 13.1 % (ref 11.5–14.5)
WBC: 4.5 10*3/uL (ref 3.8–10.6)

## 2013-10-15 LAB — MAGNESIUM: MAGNESIUM: 2.1 mg/dL

## 2013-10-15 LAB — TROPONIN I: Troponin-I: <0.02 ng/mL

## 2013-10-16 ENCOUNTER — Ambulatory Visit: Payer: Self-pay | Admitting: Neurology

## 2013-10-16 DIAGNOSIS — I517 Cardiomegaly: Secondary | ICD-10-CM

## 2013-10-16 LAB — CBC WITH DIFFERENTIAL/PLATELET
BASOS ABS: 0 10*3/uL (ref 0.0–0.1)
Basophil %: 0.5 %
EOS ABS: 0.2 10*3/uL (ref 0.0–0.7)
EOS PCT: 4.6 %
HCT: 27.9 % — ABNORMAL LOW (ref 40.0–52.0)
HGB: 9.5 g/dL — ABNORMAL LOW (ref 13.0–18.0)
Lymphocyte #: 1.1 10*3/uL (ref 1.0–3.6)
Lymphocyte %: 23 %
MCH: 33.7 pg (ref 26.0–34.0)
MCHC: 34.3 g/dL (ref 32.0–36.0)
MCV: 98 fL (ref 80–100)
MONO ABS: 0.7 x10 3/mm (ref 0.2–1.0)
Monocyte %: 14.3 %
Neutrophil #: 2.7 10*3/uL (ref 1.4–6.5)
Neutrophil %: 57.6 %
Platelet: 165 10*3/uL (ref 150–440)
RBC: 2.83 10*6/uL — AB (ref 4.40–5.90)
RDW: 13 % (ref 11.5–14.5)
WBC: 4.7 10*3/uL (ref 3.8–10.6)

## 2013-10-16 LAB — BASIC METABOLIC PANEL
ANION GAP: 6 — AB (ref 7–16)
BUN: 27 mg/dL — AB (ref 7–18)
CREATININE: 4.01 mg/dL — AB (ref 0.60–1.30)
Calcium, Total: 8.5 mg/dL (ref 8.5–10.1)
Chloride: 98 mmol/L (ref 98–107)
Co2: 34 mmol/L — ABNORMAL HIGH (ref 21–32)
GFR CALC AF AMER: 17 — AB
GFR CALC NON AF AMER: 14 — AB
Glucose: 219 mg/dL — ABNORMAL HIGH (ref 65–99)
OSMOLALITY: 287 (ref 275–301)
Potassium: 3.9 mmol/L (ref 3.5–5.1)
Sodium: 138 mmol/L (ref 136–145)

## 2013-10-16 LAB — LIPID PANEL
CHOLESTEROL: 162 mg/dL (ref 0–200)
HDL: 28 mg/dL — AB (ref 40–60)
LDL CHOLESTEROL, CALC: 77 mg/dL (ref 0–100)
TRIGLYCERIDES: 286 mg/dL — AB (ref 0–200)
VLDL Cholesterol, Calc: 57 mg/dL — ABNORMAL HIGH (ref 5–40)

## 2013-10-16 LAB — HEMOGLOBIN A1C: Hemoglobin A1C: 9.3 % — ABNORMAL HIGH (ref 4.2–6.3)

## 2013-10-20 ENCOUNTER — Emergency Department: Payer: Self-pay | Admitting: Emergency Medicine

## 2013-10-20 LAB — URINALYSIS, COMPLETE
BILIRUBIN, UR: NEGATIVE
Blood: NEGATIVE
Ketone: NEGATIVE
Nitrite: NEGATIVE
Ph: 6 (ref 4.5–8.0)
RBC,UR: 3 /HPF (ref 0–5)
Specific Gravity: 1.014 (ref 1.003–1.030)
Squamous Epithelial: 4

## 2013-10-20 LAB — TROPONIN I: Troponin-I: <0.02 ng/mL

## 2013-10-20 LAB — BASIC METABOLIC PANEL
Anion Gap: 9 (ref 7–16)
BUN: 22 mg/dL — ABNORMAL HIGH (ref 7–18)
CALCIUM: 8.8 mg/dL (ref 8.5–10.1)
CO2: 30 mmol/L (ref 21–32)
CREATININE: 3.52 mg/dL — AB (ref 0.60–1.30)
Chloride: 97 mmol/L — ABNORMAL LOW (ref 98–107)
GFR CALC AF AMER: 19 — AB
GFR CALC NON AF AMER: 17 — AB
Glucose: 114 mg/dL — ABNORMAL HIGH (ref 65–99)
Osmolality: 276 (ref 275–301)
Potassium: 4.3 mmol/L (ref 3.5–5.1)
Sodium: 136 mmol/L (ref 136–145)

## 2013-10-20 LAB — CBC WITH DIFFERENTIAL/PLATELET
BASOS ABS: 0 10*3/uL (ref 0.0–0.1)
BASOS PCT: 0.7 %
EOS ABS: 0.3 10*3/uL (ref 0.0–0.7)
EOS PCT: 5.8 %
HCT: 24.8 % — AB (ref 40.0–52.0)
HGB: 8 g/dL — AB (ref 13.0–18.0)
LYMPHS ABS: 1 10*3/uL (ref 1.0–3.6)
LYMPHS PCT: 22.5 %
MCH: 32.5 pg (ref 26.0–34.0)
MCHC: 32.3 g/dL (ref 32.0–36.0)
MCV: 101 fL — ABNORMAL HIGH (ref 80–100)
Monocyte #: 0.6 x10 3/mm (ref 0.2–1.0)
Monocyte %: 13 %
NEUTROS PCT: 58 %
Neutrophil #: 2.5 10*3/uL (ref 1.4–6.5)
Platelet: 145 10*3/uL — ABNORMAL LOW (ref 150–440)
RBC: 2.47 10*6/uL — ABNORMAL LOW (ref 4.40–5.90)
RDW: 12.6 % (ref 11.5–14.5)
WBC: 4.4 10*3/uL (ref 3.8–10.6)

## 2013-10-20 LAB — HEMOGLOBIN: HGB: 9.5 g/dL — ABNORMAL LOW (ref 13.0–18.0)

## 2013-10-22 LAB — URINE CULTURE

## 2013-11-18 ENCOUNTER — Ambulatory Visit: Payer: Self-pay | Admitting: Internal Medicine

## 2013-11-18 LAB — COMPREHENSIVE METABOLIC PANEL
ALK PHOS: 105 U/L
Albumin: 3.7 g/dL (ref 3.4–5.0)
Anion Gap: 10 (ref 7–16)
BUN: 30 mg/dL — ABNORMAL HIGH (ref 7–18)
Bilirubin,Total: 0.3 mg/dL (ref 0.2–1.0)
CALCIUM: 8.6 mg/dL (ref 8.5–10.1)
Chloride: 97 mmol/L — ABNORMAL LOW (ref 98–107)
Co2: 33 mmol/L — ABNORMAL HIGH (ref 21–32)
Creatinine: 5.05 mg/dL — ABNORMAL HIGH (ref 0.60–1.30)
EGFR (African American): 13 — ABNORMAL LOW
GFR CALC NON AF AMER: 11 — AB
Glucose: 265 mg/dL — ABNORMAL HIGH (ref 65–99)
Osmolality: 295 (ref 275–301)
POTASSIUM: 4 mmol/L (ref 3.5–5.1)
SGOT(AST): 15 U/L (ref 15–37)
SGPT (ALT): 25 U/L
Sodium: 140 mmol/L (ref 136–145)
TOTAL PROTEIN: 7 g/dL (ref 6.4–8.2)

## 2013-11-18 LAB — CBC CANCER CENTER
BASOS ABS: 0 x10 3/mm (ref 0.0–0.1)
Basophil %: 0.7 %
EOS ABS: 0.3 x10 3/mm (ref 0.0–0.7)
Eosinophil %: 5.7 %
HCT: 29.3 % — AB (ref 40.0–52.0)
HGB: 9.9 g/dL — AB (ref 13.0–18.0)
Lymphocyte #: 1.2 x10 3/mm (ref 1.0–3.6)
Lymphocyte %: 22.1 %
MCH: 33 pg (ref 26.0–34.0)
MCHC: 33.8 g/dL (ref 32.0–36.0)
MCV: 98 fL (ref 80–100)
Monocyte #: 0.6 x10 3/mm (ref 0.2–1.0)
Monocyte %: 11.4 %
NEUTROS ABS: 3.3 x10 3/mm (ref 1.4–6.5)
Neutrophil %: 60.1 %
Platelet: 190 x10 3/mm (ref 150–440)
RBC: 2.99 10*6/uL — ABNORMAL LOW (ref 4.40–5.90)
RDW: 13.5 % (ref 11.5–14.5)
WBC: 5.5 x10 3/mm (ref 3.8–10.6)

## 2013-11-18 LAB — LACTATE DEHYDROGENASE: LDH: 184 U/L (ref 85–241)

## 2013-12-02 ENCOUNTER — Ambulatory Visit: Payer: Self-pay | Admitting: Internal Medicine

## 2014-01-12 ENCOUNTER — Ambulatory Visit (INDEPENDENT_AMBULATORY_CARE_PROVIDER_SITE_OTHER): Payer: Medicare PPO | Admitting: Podiatry

## 2014-01-12 DIAGNOSIS — B351 Tinea unguium: Secondary | ICD-10-CM

## 2014-01-12 DIAGNOSIS — L97511 Non-pressure chronic ulcer of other part of right foot limited to breakdown of skin: Secondary | ICD-10-CM

## 2014-01-12 NOTE — Progress Notes (Signed)
He presents today with a chief complaint of painful elongated toenails and superficial ulceration sub-fifth metatarsal head of the left foot. He states her in a the ulcers coming back because of been more active on it and it has started to bother me. He denies fever chills nausea vomiting muscle aches and pains. Has recently had his first flush of chemotherapy for his bladder. And he is still fighting leukemia.  Objective: Vital signs are stable he is alert and oriented x3 pulses are palpable to the bilateral foot. Nails are thick yellow dystrophic and mycotic and painful palpation. Ulceration sub-fifth metatarsal head of the left foot appears to be clean reactive hyperkeratosis is present was debrided demonstrates bleeding with no deep probing to bone accessible.  Assessment: Diabetes kidney failure ulceration sub-fifth met head left foot pain in limb secondary to onychomycosis bilateral.  Plan: Debridement of nails and ulceration bilateral. Continue to treat ulceration conservatively should he develops fever chills nausea vomiting or cellulitis of the left foot he'll notify us immediately.

## 2014-03-16 ENCOUNTER — Ambulatory Visit (INDEPENDENT_AMBULATORY_CARE_PROVIDER_SITE_OTHER): Payer: Medicare PPO | Admitting: Podiatry

## 2014-03-16 VITALS — BP 122/53 | HR 71 | Resp 16

## 2014-03-16 DIAGNOSIS — L97511 Non-pressure chronic ulcer of other part of right foot limited to breakdown of skin: Secondary | ICD-10-CM

## 2014-03-16 MED ORDER — AMOXICILLIN-POT CLAVULANATE 875-125 MG PO TABS: 1.0000 | ORAL_TABLET | Freq: Two times a day (BID) | ORAL | Status: DC

## 2014-03-16 NOTE — Progress Notes (Signed)
He presents today for follow-up of ulceration sub-fifth metatarsal head of the left foot. States that he had an more active since his wife has been EL and he has been having to take care of her more frequently.  Objective: Pulses remain palpable left foot. Solitary lesion plantar aspect sub-fifth met head left foot was debrided today measures approximately 2-1/2 cm in total diameter. Does not probe to bone but does demonstrate granulation tissue with all lines of epithelialization. This does smell infectious though it does not probe deep.  Assessment: Diabetic ulceration with bacterial colonization sub-fifth metatarsal head left foot.  Plan: Debrided all necrotic and reactive hyperkeratosis today discussed pathology conservative therapies. I also wrote a prescription for antibiotics as well as dressing changes. I will follow-up with him in 2 weeks.

## 2014-03-30 ENCOUNTER — Ambulatory Visit (INDEPENDENT_AMBULATORY_CARE_PROVIDER_SITE_OTHER): Payer: Medicare PPO | Admitting: Podiatry

## 2014-03-30 VITALS — BP 120/60 | HR 70 | Resp 16

## 2014-03-30 DIAGNOSIS — B351 Tinea unguium: Secondary | ICD-10-CM

## 2014-03-30 DIAGNOSIS — L97511 Non-pressure chronic ulcer of other part of right foot limited to breakdown of skin: Secondary | ICD-10-CM

## 2014-03-30 DIAGNOSIS — M79676 Pain in unspecified toe(s): Secondary | ICD-10-CM

## 2014-03-31 NOTE — Progress Notes (Signed)
He presents today for follow-up of ulceration plantar aspect of his left foot. He is also complaining of painful elongated toenails.  Objective: Vital signs are stable he is alert and oriented 3. Ulcerations of fifth metatarsal head demonstrates much decrease in size and appears to be healing very nicely is no malodor I debrided reactive hyperkeratosis as well as granulation tissue which bled freely. No signs of infection. Toenails are thick yellow dystrophic onychomycotic and painful palpation.  Assessment: Pain in limb secondary to onychomycosis diabetic ulceration sub-fifth metatarsal head left foot healing slowly.  Plan: Continue all conservative therapies for the wound. He will take one more round of antibiotics and continue dressing daily. We debrided nails 1 through 5 bilateral is a covered service secondary to pain.

## 2014-04-13 ENCOUNTER — Ambulatory Visit (INDEPENDENT_AMBULATORY_CARE_PROVIDER_SITE_OTHER): Payer: Medicare PPO | Admitting: Podiatry

## 2014-04-13 VITALS — BP 119/56 | HR 64 | Resp 16

## 2014-04-13 DIAGNOSIS — L97509 Non-pressure chronic ulcer of other part of unspecified foot with unspecified severity: Secondary | ICD-10-CM

## 2014-04-13 DIAGNOSIS — L97521 Non-pressure chronic ulcer of other part of left foot limited to breakdown of skin: Secondary | ICD-10-CM

## 2014-04-13 NOTE — Progress Notes (Signed)
He presents today for follow-up of ulceration sub-fifth met head left foot. He states that my wife says it is doing much better.  Objective: Vital signs are stable he is alert and oriented 3 pulses are palpable left lower extremity. Ulceration sub-fifth met head left foot appears to be healing very nicely. Reactive hyperkeratotic rim is present once debrided today to bleeding does not demonstrate any type of purulence.  Assessment: Well-healed surgical foot left.  Plan: Debridement of the wound today and redressed with a dry sterile compressive dressing encouraged range of motion exercises and dressing changes daily after soaking in warm water and Epsom salts.

## 2014-04-20 ENCOUNTER — Ambulatory Visit: Payer: Medicare PPO | Admitting: Podiatry

## 2014-04-20 ENCOUNTER — Ambulatory Visit: Payer: Medicare PPO

## 2014-05-11 ENCOUNTER — Ambulatory Visit (INDEPENDENT_AMBULATORY_CARE_PROVIDER_SITE_OTHER): Payer: Medicare PPO | Admitting: Podiatry

## 2014-05-11 DIAGNOSIS — L89891 Pressure ulcer of other site, stage 1: Secondary | ICD-10-CM

## 2014-05-11 DIAGNOSIS — L97521 Non-pressure chronic ulcer of other part of left foot limited to breakdown of skin: Secondary | ICD-10-CM

## 2014-05-11 NOTE — Progress Notes (Signed)
He presents today for follow-up of ulceration sub-fifth metatarsal head of the left foot. He denies fever chills nausea vomiting muscle aches and pains and states that he continues to dress this on a regular basis.  Objective: Vital signs are stable he's alert and oriented 3. Pulses remain palpable to the left foot. Reactive hyperkeratosis surrounding an area of central necrosis to the plantar aspect of the fifth metatarsal head of the left foot. This reactive hyperkeratotic tissue was sharply debrided and resected. This left approximately a 5 mm superficial ulceration with granulation tissue and epithelialization sub-fifth metatarsal head left foot. This does not demonstrate any type of infection at this point.  Assessment: Diabetic ulceration left foot.  Plan: Debrided reactive hyperkeratotic tissue today continue conservative therapies and will follow up with him in a proximally 1 month. He will watch for signs and symptoms of worsening infection and will notify me should any of these occur.

## 2014-05-12 ENCOUNTER — Ambulatory Visit: Payer: Self-pay | Admitting: Internal Medicine

## 2014-05-21 ENCOUNTER — Ambulatory Visit: Payer: Self-pay | Admitting: Vascular Surgery

## 2014-06-01 ENCOUNTER — Ambulatory Visit: Payer: Medicare PPO

## 2014-06-01 ENCOUNTER — Ambulatory Visit: Payer: Medicare PPO | Admitting: Podiatry

## 2014-06-02 ENCOUNTER — Ambulatory Visit
Admit: 2014-06-02 | Disposition: A | Discharge status: Home / Self Care | Payer: Self-pay | Attending: Internal Medicine | Admitting: Internal Medicine

## 2014-07-08 ENCOUNTER — Ambulatory Visit (INDEPENDENT_AMBULATORY_CARE_PROVIDER_SITE_OTHER): Payer: Medicare PPO | Admitting: Podiatry

## 2014-07-08 ENCOUNTER — Encounter: Payer: Self-pay | Admitting: Podiatry

## 2014-07-08 VITALS — BP 164/65 | HR 69 | Resp 16

## 2014-07-08 DIAGNOSIS — L97521 Non-pressure chronic ulcer of other part of left foot limited to breakdown of skin: Secondary | ICD-10-CM

## 2014-07-08 DIAGNOSIS — B351 Tinea unguium: Secondary | ICD-10-CM

## 2014-07-08 DIAGNOSIS — L89891 Pressure ulcer of other site, stage 1: Secondary | ICD-10-CM | POA: Diagnosis not present

## 2014-07-08 DIAGNOSIS — M79676 Pain in unspecified toe(s): Secondary | ICD-10-CM | POA: Diagnosis not present

## 2014-07-08 MED ORDER — AMOXICILLIN-POT CLAVULANATE 875-125 MG PO TABS: 1.0000 | ORAL_TABLET | Freq: Two times a day (BID) | ORAL | Status: DC

## 2014-07-08 NOTE — Progress Notes (Signed)
He presents today for follow-up of his ulceration left sub-fifth metatarsal. He denies fever chills nausea vomiting muscle aches or pains or any increase in his blood sugars. He continues hemodialysis.  Objective: Vital signs are stable he is alert and oriented 3 pulses are palpable left foot. Reactive ulcerative lesion plantar aspect left metatarsal head. I debrided all reactive hyperkeratosis to bleeding today there was no probing to bone though there was some surrounding cellulitis and malodor present.  Assessment: Diabetic ulceration sub-fifth metatarsal head of the left foot.  Plan: Debridement of ulcer today redressed with a dry sterile compressive dressing wrote a prescription for Augmentin 875 mg 1 by mouth twice a day and I will follow-up with him in 3 weeks

## 2014-07-24 NOTE — Op Note (Signed)
PATIENT NAME:  Mark [name], Mark [name] MR#:  [id number] DATE OF BIRTH:  07-25-1945  DATE OF OPERATION:  06/05/2012  PREOPERATIVE DIAGNOSES: 1. Chronic kidney disease, nearing dialysis dependence.  2. Diabetes mellitus.  3. Hypertension.   POSTOPERATIVE DIAGNOSES: 1. Chronic kidney disease, nearing dialysis dependence.  2. Diabetes mellitus.  3. Hypertension.   PROCEDURE: Left radiocephalic AV fistula creation.   SURGEON: Dew.   ANESTHESIA: General.   BLOOD LOSS: Approximately 25 mL.   INDICATION FOR PROCEDURE: This is a 68 year old male with chronic kidney disease, nearing dialysis dependence. A fistula was created for permanent dialysis access with the knowledge that this will take at least 6 to 8 weeks for maturation. This was placed preemptively, knowing that not all fistulas will mature and that time is necessary and we are attempting to avoid catheter dialysis if possible. Risks and benefits were discussed. Informed consent was obtained.   DESCRIPTION OF PROCEDURE: The patient was brought to the operative suite, and after an adequate level of general anesthesia was obtained, the left upper extremity was sterilely prepped and draped and a sterile surgical field was created. An incision was created between the visible cephalic vein and the palpable radial artery in the distal forearm. I dissected out the cephalic vein initially and ligated 2 vein branches between silk ties and divided these. I then marked the vein for orientation. I then dissected out the radial artery. This was encircled with vessel loops proximally and distally, and one small lateral radial artery branch was ligated and divided between silk ties. The patient was systemically heparinized with 3000 units of intravenous heparin for systemic anticoagulation. The vein was ligated distally. It was then cut and beveled to an appropriate length to match an anterior wall arteriotomy. It was created with an 11-blade and extended with  Potts scissors. An anastomosis was created with a running 6-0 Prolene suture in the usual fashion. The vessel was flushed and de-aired prior to releasing control. On release, two 6-0 Prolene patch sutures were used for hemostasis. There was a nice, soft thrill within the AV fistula, and the wound was then irrigated. Surgicel was placed. The wound was then closed with a running 3-0 Vicryl and a 4-0 Monocryl. Dermabond was placed as a dressing. The patient was awakened from anesthesia and taken to the recovery room in stable condition, having tolerated the procedure well.     ____________________________ Algernon Huxley, MD jsd:dm D: 06/05/2012 12:50:52 ET T: 06/05/2012 13:17:09 ET JOB#: [id number]  cc: Algernon Huxley, MD, <Dictator> Murlean Iba, MD Munsoor Lilian Kapur, MD Mamie Levers, MD  Algernon Huxley MD ELECTRONICALLY SIGNED 06/06/2012 9:46

## 2014-07-25 NOTE — Consult Note (Signed)
PATIENT NAME:  Mark [name], Mark [name] MR#:  [id number] DATE OF BIRTH:  [birth year]  DATE OF CONSULTATION:  05/07/2013  REFERRING PHYSICIAN:  Dr. Bridgett Larsson CONSULTING PHYSICIAN:  Sandeep R. Ma Hillock, MD  REASON FOR CONSULTATION:  Leukocytosis and anemia.   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  The patient recently received cycle two of chemotherapy on December 26 and 28, then got Neulasta injection on December 29th for prevention of febrile neutropenia.  He also has multifactorial anemia.  The patient has been admitted to the hospital with progressive shortness of breath, possible pneumonia.  He states that he is beginning to feel better today, but is still weak and continues on oxygen.  No fever or chills currently.  Has cough with scanty sputum.  Denies hemoptysis.  No other bleeding issues.  White blood count is high today at 22,400, hemoglobin 7.8, platelets 117, neutrophils 87.6%.   PAST MEDICAL HISTORY AND PAST SURGICAL HISTORY: 1.  Lymphoma as described above.  2.  Diabetes mellitus.  3.  Hypertension.  4.  Chronic kidney disease stage 4, on dialysis.  5.  History of left foot chronic wound infection which is improved now.  6.  Arthritis.   FAMILY HISTORY:  Noncontributory.   SOCIAL HISTORY:  Ex-smoker, quit in 2013.  Occasional alcohol intake.  Denies recreational drug usage.   ALLERGIES:  DAYPRO.   HOME MEDICATIONS: 1.  Reglan 5 mg daily.  2.  Lovastatin 20 mg daily.  3.  Metoprolol 100 mg twice daily.  4.  Lasix 80 mg once a week.  5.  Glimepiride 2 mg daily.  6.  Calcium acetate 3 capsules 3 times daily.   REVIEW OF SYSTEMS: CONSTITUTIONAL:  Weak and tired.  No fevers.  No night sweats.  HEENT:  Currently denies headaches or  dizziness at rest.  No epistaxis, ear or jaw pain.  CARDIAC:  No angina, palpitation, or orthopnea.  LUNGS:  As in HPI.  GASTROINTESTINAL:  Denies nausea, vomiting, or diarrhea.  No blood in stools.  GENITOURINARY:  No dysuria or hematuria.  MUSCULOSKELETAL:  Has chronic arthritis, denies new bone pains.  SKIN:  No new rashes or major bruising.  NEUROLOGIC:  No new focal weakness, seizures or loss of consciousness.  ENDOCRINE:  No polyuria or polydipsia.  Appetite is fair.   PHYSICAL EXAMINATION: GENERAL:  The patient is weak and tired-looking, resting in bed on nasal cannula oxygen, otherwise alert and oriented and converses appropriately.  No icterus.  Pallor present.  VITAL SIGNS:  100.8, 72, 128/63, 20, 95% on mask.  HEENT:  Normocephalic, atraumatic.  Extraocular movements intact.  Sclerae anicteric.  NECK:  Negative for JVD.  No progressive lymphadenopathy in left lower neck, appears smaller on chemotherapy.  CARDIOVASCULAR:  S1, S2, regular.  LUNGS:  Bilateral diminished breath sounds, especially at bases, no rhonchi.  ABDOMEN:  Soft, nontender.  No hepatosplenomegaly clinically.  EXTREMITIES:  Mild edema.  NEUROLOGIC:  Limited examination, cranial nerves seem intact.  Moves all extremities spontaneously.   LABORATORY, DIAGNOSTIC, AND RADIOLOGICAL DATA:  WBC 22,400, hemoglobin 7.8, platelets 117, 87% neutrophils, 3.6% lymphocytes, 7.7% monocytes, creatinine 4.48, calcium 8.3.  LFT unremarkable except alkaline phosphatase of 198.  Chest x-ray February 3rd reports subtle densities in the left mid and lower lung region, ? atypical  infectious process, asymmetric edema or atelectasis.   IMPRESSION AND RECOMMENDATIONS:  A 69 year old gentleman with known history of stage IV lymphoma who received cycle two chemotherapy with Treanda and Rituxan from January 26th to 28th, then received Neulasta injection on January 29th.  He is currently admitted with progressive shortness of breath, however  possibly pneumonia versus other etiology.  Agree with ongoing supportive treatment and broad-spectrum antibiotic coverage, he is on azithromycin and Rocephin, along with Lasix 80 mg daily.  The patient also is on hemodialysis.  CBC shows neutrophilic leukocytosis which could be secondary to recent Neulasta injection effect along with respiratory tract infection.  Anemia is multifactorial including anemia of end-stage renal disease, lymphoma and recent chemotherapy side effect.  We will also request stool Hemoccult to make sure he is not having gastrointestinal blood loss.  Platelet count slightly low, no obvious bleeding symptoms.  Would recommend considering packed red blood cell transfusion if hemoglobin drops to less than 7 or if he develops progressive fatigue, dyspnea or hypoxia.  If the patient is discharged soon, he was advised to keep follow-up appointments at [facility] same as previously scheduled.  We will continue to follow during hospitalization as indicated.   Thank you for the referral, please feel free to contact me for additional questions.    ____________________________ Rhett Bannister Ma Hillock, MD srp:ea D: 05/07/2013 23:39:13 ET T: 05/08/2013 00:21:28 ET JOB#: [id number]  cc: Trevor Iha R. Ma Hillock, MD, <Dictator> Alveta Heimlich MD ELECTRONICALLY SIGNED 05/08/2013 8:41

## 2014-07-25 NOTE — Consult Note (Signed)
PATIENT NAME:  Mark [name], Mark [name] MR#:  [id number] DATE OF BIRTH:  27-Dec-1945  DATE OF CONSULTATION:  10/16/2013  CONSULTING PHYSICIAN:  Leotis Pain, MD  REASON FOR CONSULTATION:  Stroke on imaging.  REASON FOR CONSULTATION:  The patient is a 69 year old gentleman with past medical of end-stage renal disease, diabetes, hypertension, on dialysis Monday, Wednesday, Friday, presents after dialysis with generalized weakness. The patient is status post CAT scan evaluation in the Emergency Department, found to have what appears to be subacute right occipital infarct. Currently, the patient is much improved, states he is close to baseline and is asking to be discharged.   REVIEW OF SYSTEMS:  Currently no fatigue, no fever, slight generalized weakness but much improved, chronic history of blurry vision like diabetic retinopathy. No difficulty in swallowing. No cough. No wheeze, no chest pain. No nausea. No vomiting. No dysuria. No hematuria no polyuria or polydipsia. No anemia or easy bruising. No agitation or depression.   PAST MEDICAL HISTORY: CLL, bladder cancer, diabetes, hypertension, end-stage renal disease, foot drop, osteoarthritis, history of alcohol abuse, and hyperlipidemia.   ALLERGIES: INCLUDE DAYPRO.   PAST SURGICAL HISTORY: Lumbar laminectomy and gum surgery and fistula placement for the dialysis.   FAMILY HISTORY: Positive for diabetes and hypertension.   SOCIAL HISTORY: Recently quit smoking. Currently no EtOH or drug use.   LABORATORY DATA: Work-up has been reviewed.   IMAGING: CAT scan is showing right subacute infarct.   PHYSICAL EXAMINATION:  VITAL SIGNS: Include a temperature of 97.9, pulse 60, respirations 19, blood pressure 154/72. NEUROLOGIC: The patient is alert, awake, oriented to time, location, knows the reason why he is in the hospital. Speech appears to be fluent. No signs of dysarthria or aphasia have been noted. Extraocular movements intact. Pupils 4/2, reactive  bilaterally. Facial sensation intact. Facial motor is intact. Tongue is midline. Uvula elevates symmetrically. Shoulder shrug intact. Motor strength appears to be 5/5 bilateral upper extremities and 4+/5 bilateral lower extremities. Reflexes severely diminished and absent in the lower extremities. Sensation intact to light touch and temperature.   IMPRESSION: A 69 year old male, with hypertension and diabetes, end-stage renal disease on hemodialysis, CLL with newly diagnosed bladder carcinoma presents with generalized weakness and an incidental finding on CAT scan of right occipital subacute stroke. The patient is currently back to baseline.   PLAN: Agree with starting the patient on aspirin daily as he was not on it at home. The patient is likely hypercoagulable due to history of cancer. Currently he has no visual field deficits on examination. Agree with discharge planning and followup with neurology as needed as an outpatient. This case was discussed with the primary team. Thank you. It was a pleasure seeing this patient.    ____________________________ Leotis Pain, MD yz:lt D: 10/16/2013 14:18:28 ET T: 10/16/2013 14:43:32 ET JOB#: [id number]  cc: Leotis Pain, MD, <Dictator> Leotis Pain MD ELECTRONICALLY SIGNED 11/04/2013 21:14

## 2014-07-25 NOTE — Discharge Summary (Signed)
PATIENT NAME:  Mark [name], CIMO MR#:  [id number] DATE OF BIRTH:  [birth year]  DATE OF ADMISSION:  10/15/2013 DATE OF DISCHARGE:  10/16/2013  ADMISSION DIAGNOSIS: Acute cerebrovascular accident.   DISCHARGE DIAGNOSES:  1.  Acute right occipital lobe cerebrovascular accident.  2.  Hypertension.  3.  Diabetes.   CONSULTATIONS: Neurology.   IMAGING: 1.  The patient had a CT of the head which was positive for an acute right-sided occipital lobe infarct.  2.  Echocardiogram showed an EF of 60% to 65% with moderate concentric LVH.  3.  Carotid ultrasound showed no hemodynamically significant stenosis.  LABORATORY DATA:   LDL was 77, VLDL 57, HDL 28, cholesterol 162, triglycerides 286. White blood cells 4.7, hemoglobin 9.5, hematocrit 28, platelets are 165,000. Hemoglobin A1c is 9.3.   HOSPITAL COURSE: This is a 69 year old male who presented with weakness. Incidentally, he had a CT scan which was positive for an acute stroke. For further details, please refer to H and P.  1.  Acute right occipital lobe stroke.This was more then likley an incidental finding as he did not have any neurological issues or new vision changes and as per NEURO consult likely this was subacute/chronic. In nay case he underwent a stroke workup with carotid Dopplers and echocardiogram without evidence of CVA etiology.  He was started on aspirin and continued on a statin. . PT and neurology were consulted. He did not need an MRI, as the CT scan was positive for stroke.  2.  End-stage renal disease on hemodialysis. The patient did receive dialysis, and he will resume his Monday, Wednesday, Friday.  3.  Diabetes. His A1c is very elevated. He is on glimepiride but will need close followup and adjustments to his medications and may need insulin as outpatient. We will refer him to outpatient diabetes.  4.  Hyperlipidemia. LDL is 77, which is nearly at goal.   DISCHARGE MEDICATIONS:  1.  Glimepiride 2 mg Monday, Wednesday and  Friday and b.i.d. Sunday, Tuesday, Thursday, Saturday.  2.  Nystatin triamcinolone b.i.d. p.r.n.  3.  Lovastatin 20 mg at bedtime.  4.  Reglan 5 mg Monday, Wednesday, Friday and once a day on Sunday, Tuesday, Thursday, Saturday. 5.   Metoprolol 100 mg Sunday, Tuesday, Thursday and one in the evening on Monday, Wednesday, Friday; twice a day on Saturday. 6.  Calcium acetate 667, 3 tablets t.i.d. and one with snacks.  7.  Hectorol 1 mcg, 2 tablets t.i.d. with dialysis.  8.  Calcitriol 0.5 mg t.i.d. with dialysis.  9.  Norco 5/325 q. 8 hours p.r.n. pain.  10.  Norvasc 10 mg Monday, Wednesday, Friday.  11.  Hydroxyzine 25 mg q. 12 hours p.r.n.  12.  Aspirin 325 daily.  DISPOSITION: Discharge home with home health with a nurse.  DISCHARGE OXYGEN: None.    DISCHARGE DIET: Low sodium, ADA diet.   DISCHARGE ACTIVITY: As tolerated.  DISCHARGE REFERRAL: Home health nurse and diabetes education.  DISCHARGE FOLLOWUP: The patient will need a followup with his primary care physician, Dr. Brynda Greathouse, in 1 week.   TIME SPENT: 35 minutes.  CONDITION: The patient was stable for discharge.    ____________________________ Arabella Revelle P. Benjie Karvonen, MD spm:sk D: 10/16/2013 12:58:02 ET T: 10/16/2013 13:45:59 ET JOB#: [id number]  cc: Snyder Colavito P. Benjie Karvonen, MD, <Dictator> Mikeal Hawthorne. Brynda Greathouse, MD  Donell Beers Jenavee Laguardia MD ELECTRONICALLY SIGNED 10/16/2013 19:23

## 2014-07-25 NOTE — Discharge Summary (Signed)
PATIENT NAME:  Mark [name], Mark [name] MR#:  [id number] DATE OF BIRTH:  October 17, 1945  DATE OF ADMISSION:  05/06/2013 DATE OF DISCHARGE:  05/09/2013  PRIMARY CARE PHYSICIAN: Dr. Brynda Greathouse.  DISCHARGE DIAGNOSES: 1.  Acute respiratory failure due to likely pneumonia.  2.  Anemia.  3.  Chronic lymphocytic leukemia.   4.  End-stage renal disease.   CONDITION: Stable.   CODE STATUS: Full code.   HOME MEDICATIONS: Please refer to the [facility] physician discharge instruction medication reconciliation list. The patient will be given Levaquin 500 mg every 48 hours for 10 days.   DIET: Low-sodium, low-fat, ADA, renal diet.   ACTIVITY: As tolerated.   FOLLOWUP CARE: Follow up with PCP within 1 to 2 weeks. Follow up with Dr. Ma Hillock, Dr. Holley Raring.   REASON FOR ADMISSION: Shortness of breath.   CONSULTATIONS: Hematology, Dr. Ma Hillock. Nephrology, Dr. Holley Raring.  HOSPITAL COURSE: The patient is a 69 year old Caucasian male with a history of CLL, ESRD, hypertension, diabetes, presented to the ED with shortness of breath and noted fever to 100.4. Chest x-ray showed infiltrate suggestive of possible pneumonia. For detailed history and physical examination, please refer to the admission note dictated by Dr. Dustin Flock. Laboratory data on admission date showed glucose 146, BUN 19, creatinine 3.14. WBC 28.5, hemoglobin 9.2. Chest x-ray showed substernal density in the left mid and lower lung regions.  1.  Acute respiratory failure, likely due to pneumonia: The patient was noted to be hypoxic and has been treated with oxygen by nasal cannula. Oxygen level was increased to 4 to 5 liters. In addition, the patient has been treated with nebulizer. The patient's symptoms have much improved after antibiotic treatment with Zithromax and Rocephin. Since the patient initially had respiratory failure with hypoxia, the patient got a chest CT angio, which did not show PE. We will change to p.o. Levaquin after discharge.  2.  ESRD: The  patient has been treated with hemodialysis during the hospitalization.  3.  For diabetes, the patient has been treated with sliding scale.  4.  Anemia with a history of CLL: The patient's hemoglobin decreased to 7.0 from 9.1. We requested hematology consult from Dr. Ma Hillock. He suggested PRBC transfusion if hemoglobin is less than 7. The patient got 2 units of PRBC transfusion. Hemoglobin increased to 9.6.   The patient has no complaints today. Vital signs are stable. He is clinically stable and will be discharged to home today. The patient's white count decreased from 25 to 13.7. The patient needs to follow up with PCP and check CBC.   I discussed the patient's discharge plan with the patient.   TIME SPENT: About 35 minutes.   ____________________________ Demetrios Loll, MD qc:jcm D: 05/09/2013 16:07:31 ET T: 05/09/2013 19:57:28 ET JOB#: [id number]  cc: Demetrios Loll, MD, <Dictator> Demetrios Loll MD ELECTRONICALLY SIGNED 05/13/2013 10:21

## 2014-07-25 NOTE — H&P (Signed)
PATIENT NAME:  Mark [name], Mark [name] MR#:  [id number] DATE OF BIRTH:  December 27, 1945  DATE OF ADMISSION:  05/06/2013  PRIMARY CARE PROVIDER:  Dr. Brynda Greathouse.    REFERRING PHYSICIAN:  Dr. Thomasene Lot.  CHIEF COMPLAINT: Shortness of breath since last night.    HISTORY OF PRESENT ILLNESS: The patient is a 69 year old white male with history of CLL, history of end-stage renal disease, diabetes, hypertension, arthritis, who states that he was doing okay up until yesterday when last night he started becoming short of breath. He has not had any fevers, but he went to dialysis today and was noted to have a temperature of 100.4. He is not having any wheezing. In the ED, he was noted to have an infiltrate suggestive of possible pneumonia. Therefore, we were asked to admit the patient. He reports that he has some pain on the right side of his chest. He also has been having bilateral leg cramping. The patient also has a history of right footdrop and wears a brace. He otherwise denies any abdominal pain, nausea, vomiting or diarrhea.   PAST MEDICAL HISTORY: Significant for:  1.  Small lymphocytic lymphoma/chronic lymphocytic leukemia.    2.  Diabetes.  3.  Hypertension.  4.  Chronic kidney disease on hemodialysis Tuesday, Thursday, Saturday.  5.  History of left foot intermittent wound infection.  6.  History of left footdrop.  7.  Arthritis.  8.  History of alcohol abuse in the past.  9.  Hyperlipidemia.   ALLERGIES: DAYPRO.   PAST SURGICAL HISTORY: Status post lumbar laminectomy and decompression, gum surgery, toenail removal.     FAMILY HISTORY: Positive for diabetes, hypertension. Brother with prostate cancer. Mother had melanoma to the bone. Cousin with kidney disease.   SOCIAL HISTORY: Used to smoke, quit in 2013, 50 pack-year history. Used to drink, but not anymore.   CURRENT MEDICATIONS: He is on glimepiride 2 mg daily, Lasix 80 mg 1 tab p.o. Sunday, Monday, Wednesday and Friday, lovastatin 20 q.h.s.,  Reglan 5 mg daily, metoprolol tartrate 100 mg 1 tab p.o. b.i.d., nystatin/triamcinolone topically, apply to affected area b.i.d. as needed for irritation.   REVIEW OF SYSTEMS:  CONSTITUTIONAL: Complains of fever, fatigue, weakness, chronic back pain. No weight loss. No weight gain.  EYES: No blurred or double vision. No pain. No redness. No inflammation. History of cataracts and retinopathy.  ENT: No tinnitus. No ear pain. No hearing loss. No seasonal or year-round allergies. No epistaxis. No difficulty swallowing.  RESPIRATORY:  Denies any cough, wheezing, hemoptysis. Complains of dyspnea. No painful respirations.  CARDIOVASCULAR: Denies any chest pain, orthopnea, edema or arrhythmia.  GASTROINTESTINAL: No nausea, vomiting, diarrhea. No abdominal pain. No hematemesis. No melena. No ulcer. No GERD. No IBS.  GENITOURINARY: Denies any dysuria, hematuria, renal calculus or frequency.  ENDOCRINE: Denies any polyuria, nocturia.  HEMATOLOGIC: Has CLL, followed by oncology.  SKIN: No acne. No rash.  MUSCULOSKELETAL: Has chronic back pain.  NEUROLOGIC:  No numbness, CVA, TIA.  Has history of chronic footdrop.   PHYSICAL EXAMINATION: VITAL SIGNS: Temperature 99.8, pulse 74, respirations 20, blood pressure 125/61, O2 85% on room air.  GENERAL: The patient is a well-developed male in no acute distress.  HEENT: Head atraumatic, normocephalic. Pupils equally round, reactive to light and accommodation. There is no conjunctival pallor. No scleral icterus. Nasal exam shows no drainage or ulceration. Oropharynx is clear without any exudate.  NECK: Supple without any JVD.  CARDIOVASCULAR: Regular rate and rhythm. No murmurs, rubs, clicks or gallops.  LUNGS: Clear to auscultation bilaterally without any rales, rhonchi, wheezing.  ABDOMEN: Soft, nontender, nondistended. Positive bowel sounds x 4.  EXTREMITIES: No clubbing, cyanosis or edema. He has a brace on in the left extremity.  SKIN: No rash.  LYMPHATICS:  No lymph nodes palpable.  VASCULAR: Good DP, PT pulses.  PSYCHIATRIC: Not anxious or depressed.  NEUROLOGIC: Awake, alert, oriented x 3. No focal deficits.   LABORATORY DATA: Glucose 146, BUN 19, creatinine 3.14, sodium 137, potassium 4.0, chloride 97. CO2 is 34. Calcium is 9.2. LFTs showed alk phos of 198. Troponin 0.04. WBC 28.5, hemoglobin 9.2, platelet count 148. Chest x-ray shows some substernal density in the left mid and lower lung regions.   ASSESSMENT AND PLAN: The patient is a 69 year old African American male with history of chronic lymphocytic leukemia, end-stage renal disease on hemodialysis, presents with shortness of breath since yesterday, noted to be hypoxic.  1. Acute respiratory failure, likely due to pneumonia. At this time, we will treat him with IV ceftriaxone and IV azithromycin. We will try to obtain sputum cultures, follow his WBC count. The patient also has a history of CLL that causes him to be hypercoagulable. I am going to get a CT of the chest to rule out PE as well.  2.  Diabetes. We are going to place him on sliding scale insulin, continue glipizide.  3.  Hyperlipidemia. Continue lovastatin.  4.  Chronic lymphocytic leukemia. Outpatient followup with oncology. The patient's WBC count last checked was normal, now is 28, likely due to infection. Monitor this closely. If continues to be elevated, we will ask oncology to see.  5.  End-stage renal disease. We will ask nephrology consult for dialysis to be resumed.  6.  Miscellaneous: The patient will be on heparin for DVT prophylaxis.   NOTE: 55 minutes spent.      ____________________________ Lafonda Mosses. Posey Pronto, MD shp:dmm D: 05/06/2013 20:32:22 ET T: 05/06/2013 21:04:43 ET JOB#: [id number]  cc: Daizee Firmin H. Posey Pronto, MD, <Dictator> Alric Seton MD ELECTRONICALLY SIGNED 05/18/2013 13:12

## 2014-07-25 NOTE — H&P (Signed)
PATIENT NAME:  Mark [name], Mark [name] MR#:  [id number] DATE OF BIRTH:  1945-09-03  DATE OF ADMISSION:  10/15/2013  REASON FOR ADMISSION: Generalized weakness.   PRIMARY CARE PHYSICIAN: Mikeal Hawthorne. Brynda Greathouse, MD  REFERRING PHYSICIAN: Verdia Kuba. Paduchowski, MD  CHIEF COMPLAINT:  Weakness.    HISTORY OF PRESENT ILLNESS: This is a very nice 69 year old gentleman with history of end-stage renal disease, diabetes, hypertension, comes today with chief complaint of not feeling very well, feeling really weak all over. The patient has hemodialysis on Monday, Wednesday, Friday now and after dialysis he was just feeling really drained without energy. He was waiting in the waiting room of the dialysis center for his family to pick him up and could not get up of the chair. The patient was brought into the Emergency Department. Upon examination, the patient did not have any significant neurologic deficits but a CT scan of the head was done and it showed a new acute cerebrovascular accident located on the right occipital lobe. The patient was admitted for evaluation and treatment of this condition. Overall, the patient is stable and he is feeling that he is getting a little bit better as time goes by.   REVIEW OF SYSTEMS: A 12-system review of systems is done.  CONSTITUTIONAL: No fever, fatigue. Positive for generalized weakness.  EYES: No blurry vision, double vision.  EARS, NOSE, THROAT: No difficulty swallowing or tinnitus. Actually, the patient is eating a hamburger at the time that I examined.  RESPIRATORY: No cough, wheezing or chronic obstructive pulmonary disease.  CARDIOVASCULAR: No chest pain, orthopnea. No syncope.  GASTROINTESTINAL: No nausea, vomiting, abdominal pain, constipation or diarrhea.  GENITOURINARY: No dysuria or hematuria. The patient makes some urine but not daily.   ENDOCRINE: No polyuria, polydipsia or polyphagia.  HEMATOLOGIC AND LYMPHATIC: No anemia or easy bruising.  SKIN: No rashes or  petechiae.  MUSCULOSKELETAL: No significant neck pain or back pain.  NEUROLOGIC: No numbness or tingling. Positive generalized weakness. No headaches, no vertigo. No previous cerebrovascular accidents  or transient ischemic attacks.  PSYCHIATRIC: No agitation or depression.   PAST MEDICAL HISTORY: 1.  Chronic lymphocytic leukemia.  2.  Newly-diagnosed bladder cancer.  3.  Smoldering lymphocytic lymphoma.  4.  Diabetes.  5.  Hypertension.  6.  End-stage renal disease on hemodialysis Monday, Wednesday, Friday.  7.  Foot drop after back surgery on the right side.  8.  Osteoarthritis.   9.  History of alcohol abuse.  10.  Hyperlipidemia.   ALLERGIES: DAYPRO.   PAST SURGICAL HISTORY:   1.  Lumbar laminectomy.  2.  Gum surgery.  3.  Fistula placement.   FAMILY HISTORY: Positive for diabetes, hypertension. His son had a heart attack. His cousin have heart attacks as well. His mother had melanoma. No cerebrovascular accidents in the family.   SOCIAL HISTORY: The patient used to smoke, quit in 2013. He used to smoke over a pack and a half day. He used to drink heavily and he has not drank in the last 8 months. He is retired and lives with his wife.   CURRENT MEDICATIONS: Include glimepiride 2 mg daily, Lasix 80 mg on nondialysis days, lovastatin 20 mg every night, Reglan 5 mg daily, metoprolol 100 mg twice daily. Hold metoprolol the night before dialysis.   PHYSICAL EXAMINATION: VITAL SIGNS: Blood pressure 145/72, pulse 64, respirations 14, temperature 98.6, oxygen saturation 100% on room air.  GENERAL: The patient is alert, oriented x 3, in no acute distress. No respiratory distress.  Hemodynamically stable.  HEENT: Pupils are equal and reactive. Extraocular movements are intact. Mucosa are moist. Anicteric sclerae. Pink conjunctivae. No oral lesions. No oropharyngeal exudates.  NECK: Supple. No JVD. No thyromegaly. No adenopathy. No carotid bruits.  CARDIOVASCULAR: Regular rate and rhythm.  No murmurs, rubs or gallops are appreciated. No displacement of PMI.  LUNGS: Clear without any wheezing or crepitus. No use of accessory muscles.  ABDOMEN: Soft, nontender, nondistended. No hepatosplenomegaly. No masses. Bowel sounds are positive.  EXTREMITIES: No edema, cyanosis or clubbing. Pulses +2.  NEUROLOGIC: Cranial nerves II through XII intact. Strength is equal all 4 extremities, 5 out of 5.  The patient does not have any significant deviation of the tongue. No significant deviation of the uvula. No facial droop. Face is symmetric. The patient is able to raise both shoulders without problem against opposition. No significant pronator drip. Sensation is equal in all 4 extremities. The patient has a foot drop at the level actually of the right lower extremity. He wears a brace. He has no significant changes in his regular strength.  PSYCHIATRIC: No agitation, depression.  LYMPHATIC: Negative for lymphadenopathy in neck or supraclavicular areas.   LABORATORY, DIAGNOSTIC AND RADIOLOGICAL DATA:  Glucose is 135. Creatinine  3.09, potassium 3.5, chloride is 97, CO2 is 33. Troponin is negative. White blood count is 4.5, hemoglobin is 10, platelet count 188. CT as mentioned above. Chest x-ray no acute abnormalities. EKG no ST depression or elevation, normal sinus rhythm.   ASSESSMENT AND PLAN:  A 69 year old gentleman with history of end-stage renal disease, hypertension, diabetes, comes with a new stroke.  1.  Acute stroke. The patient has an acute right occipital lobe stroke. The patient does not have any significant neurological deficits other than generalized weakness. At this moment, he is stable. He is going to be admitted for evaluation of the cause of the stroke. The patient is going to have an ultrasound of carotid arteries and an echocardiogram. He was started on aspirin 325 mg daily. He was not taking aspirin prior to the stroke. He was taking lovastatin prior. We are going to check his lipid  function tests and upgrade to atorvastatin. Neurology consult to be obtained in the morning. Continue telemetry, neuro checks. Control of diabetes. The patient apparently has had this problem going on since yesterday. He is not a candidate for thrombolysis. At this moment, we are going to allow permissive hypertension. His blood pressure is in the 140s. Restart blood pressure medications after 48 hours or depending on his blood pressure. If it is above 210 or above [id number] diastolic, we can use IV p.r.n. medications, short-acting. Overall, the patient is stable and he is a full code.  2.  Hypertension. As mentioned above, permissive hypertension.  3.  End-stage renal disease. Continue dialysis on Monday, Wednesday, Friday.  4.  Diabetes check a hemoglobin A1c. Continue glimepiride, add insulin sliding scale.  5.  Hyperlipidemia. Change lovastatin for atorvastatin. Check lipid profile.  6.  Gastrointestinal prophylaxis with Pepcid once daily.  7.  Deep vein thrombosis prophylaxis with heparin.   TIME SPENT: I spent about 50 minutes with this patient.     ____________________________ [place] Sink, MD rsg:cs D: 10/15/2013 20:25:23 ET T: 10/15/2013 20:51:26 ET JOB#: [id number]  cc: Tracyton Sink, MD, <Dictator> Maryuri Warnke America Brown MD ELECTRONICALLY SIGNED 10/16/2013 23:14

## 2014-07-25 NOTE — Op Note (Signed)
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  PROCEDURE:  Cystoscopy, bladder biopsy with fulguration of small carcinoma in situ.   SURGEON:  Dr. Collier Flowers.   ANESTHESIA:  General.   PROCEDURE IN DETAIL:  The patient was sterilely prepped and draped, good relaxation from an intubated general anesthesia, the procedure begins.  He has appropriate timeout before I can begin the procedure.  The cystoscopy reveals some large caliber strictures in the proximal urethra.  Otherwise, no problem with traversing urethra.  The prostate is small, almost nonexistent.  The bladder reveals an area of the carcinoma in situ.  They are biopsied with a cold cutting biopsy and one area is sent to pathology for diagnosis.  At the base of this is a area of bleeding that is fulgarated.  There is no bleeding.  He is sent to recovery with an empty bladder and 30 mL of 0.5% Marcaine in the bladder with a B and O suppository.  Rectal exam was negative for any carcinoma in the prostate or rectum.     ____________________________ Janice Coffin. Elnoria Howard, DO rdh:ea D: 08/29/2013 14:56:01 ET T: 08/30/2013 01:18:33 ET JOB#: [id number]  cc: Janice Coffin. Elnoria Howard, DO, <Dictator> RICHARD D HART DO ELECTRONICALLY SIGNED 09/05/2013 13:24

## 2014-07-29 ENCOUNTER — Ambulatory Visit (INDEPENDENT_AMBULATORY_CARE_PROVIDER_SITE_OTHER): Payer: Medicare PPO | Admitting: Podiatry

## 2014-07-29 DIAGNOSIS — L89891 Pressure ulcer of other site, stage 1: Secondary | ICD-10-CM

## 2014-07-29 DIAGNOSIS — L97521 Non-pressure chronic ulcer of other part of left foot limited to breakdown of skin: Secondary | ICD-10-CM

## 2014-07-29 NOTE — Progress Notes (Signed)
He presents today for follow-up of his ulceration fifth metatarsal head left foot plantarly. He denies fever chills nausea vomiting muscle aches and pains. He states that he is going to have the other prescription of antibiotics field. He continues conservative therapies of daily soaking and antibiotic ointment regimen to his fifth metatarsal plantar aspect left foot.  Objective: Vital signs are stable he is alert and oriented 3 pulses are palpable. He has reactive hyperkeratosis surrounding a central 5 mm ulcerative lesion sub-fifth metatarsal head of the left foot with granulation tissue within the ulceration. The ulceration does not probe to bone. I see no signs of current infection.  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  Plan: I encouraged him to finish his antibiotics and continue daily dressing changes. I will follow-up with him in 3-4 weeks

## 2014-08-02 NOTE — Op Note (Signed)
PATIENT NAME:  Mark [name], Mark [name] MR#:  [id number] DATE OF BIRTH:  09/21/1945  DATE OF PROCEDURE:  05/21/2014  PREOPERATIVE DIAGNOSES:  1.  End-stage renal disease.  2.  Poorly functioning left arm arteriovenous fistula.  3.  Hypertension.  POSTOPERATIVE DIAGNOSES: 1.  End-stage renal disease.  2.  Poorly functioning left arm arteriovenous fistula.  3.  Hypertension.  PROCEDURE: 1.  Ultrasound guidance for vascular access to left radiocephalic AV fistula at the antecubital fossa in the retrograde fashion.  2.  Left upper extremity fistulogram and central venogram.  3.  Percutaneous transluminal angioplasty of paraanastomotic stenosis with 6 mm diameter Lutonix drug-coated angioplasty balloon.  4.  Percutaneous transluminal angioplasty of mid forearm cephalic vein for separate distinct stenosis with 8 and 9 mm diameter angioplasty balloons.   SURGEON: Algernon Huxley, M.D.   ANESTHESIA: Local with moderate conscious sedation.   ESTIMATED BLOOD LOSS: 25 mL.  FLUOROSCOPY TIME: Approximately 4 minutes.  CONTRAST USED: 40 mL of contrast were used.   INDICATION FOR PROCEDURE: This is a 69 year old gentleman with end-stage renal disease. His dialysis access has not been running well. They have been pulling clots and having difficulties with access. He is brought in for a fistulogram and a noninvasive study suggested significant stenosis. Risks and benefits were discussed. Informed consent was obtained.   DESCRIPTION OF PROCEDURE: The patient is brought to the vascular suite. The left upper extremity was sterilely prepped and draped and a sterile surgical field was created. The fistula was accessed just below the antecubital fossa in a retrograde fashion in the cephalic vein, under direct ultrasound guidance and a permanent image was recorded. This was done with a micropuncture needle, and micropuncture wire and sheath were then placed. We upsized to a 6 [place] sheath. The patient was given a small  dose of intravenous heparin. Imaging performed through the sheath showed dual outflow in the upper arm and patent central venous circulation. A Kumpe catheter was placed across the arterial and OV anastomosis into the radial artery and imaging was performed. This showed about an 80% stenosis at and just beyond the anastomosis from intimal hyperplasia between the 2 access sites in a separate distinct lesion was about an 85%-90% stenosis of the cephalic vein. At this point I re-placed the Magic torque wire and crossed both lesions. I treated the paraanastomotic stenosis with a 6 mm diameter Lutonix drug-coated angioplasty balloon inflated for 1 minute. The balloon was taken just into the radial artery across the anastomosis and a few centimeters back into the cephalic vein. Completion angiogram following this showed markedly improved flow with about a 15%-20% residual stenosis was remaining. I then ballooned the cephalic vein in the mid upper arm with an 8 mm diameter high-pressure angioplasty balloon and a 9 mm diameter conventional angioplasty balloon. This resulted in significantly improved flow. There was about a 20% residual stenosis which was not flow limiting and I elected to terminate the procedure. The sheath was removed, 4-0 Monocryl pursestring suture was placed. Pressure was held. Sterile dressing was placed. The patient tolerated the procedure well and was taken to the recovery room in stable condition.   ____________________________ Algernon Huxley, MD jsd:mc D: 05/21/2014 10:32:51 ET T: 05/21/2014 10:56:43 ET JOB#: [id number]  cc: Algernon Huxley, MD, <Dictator> Algernon Huxley MD ELECTRONICALLY SIGNED 05/27/2014 16:39

## 2014-08-17 ENCOUNTER — Encounter: Payer: Self-pay | Admitting: Podiatry

## 2014-08-17 ENCOUNTER — Ambulatory Visit (INDEPENDENT_AMBULATORY_CARE_PROVIDER_SITE_OTHER): Payer: Medicare PPO | Admitting: Podiatry

## 2014-08-17 VITALS — BP 147/56 | HR 64 | Resp 16

## 2014-08-17 DIAGNOSIS — L97521 Non-pressure chronic ulcer of other part of left foot limited to breakdown of skin: Secondary | ICD-10-CM

## 2014-08-17 DIAGNOSIS — L89891 Pressure ulcer of other site, stage 1: Secondary | ICD-10-CM | POA: Diagnosis not present

## 2014-08-17 NOTE — Progress Notes (Signed)
He presents today for follow-up ulceration sub-fifth metatarsal head left foot. He states this doing okay I guess. He stopped the use of the antibiotic ointment approximate 1 week ago.  Objective: Vital signs are stable alert and oriented 3. Pulses are palpable left foot. Superficial ulceration sub-fifth met head of the left foot appears to be just that superficial with no granulation tissue. No purulence and no malodor.  Assessment: Chronic diabetic ulceration sub-fifth met head left foot.  Plan: Continue all conservative therapies all follow up with him in 1 month. I debrided all reactive hyperkeratosis and granulation tissue today to bleeding. No signs of infection.

## 2014-09-07 ENCOUNTER — Other Ambulatory Visit: Payer: Self-pay

## 2014-09-07 DIAGNOSIS — C8300 Small cell B-cell lymphoma, unspecified site: Secondary | ICD-10-CM

## 2014-09-07 DIAGNOSIS — C9110 Chronic lymphocytic leukemia of B-cell type not having achieved remission: Secondary | ICD-10-CM

## 2014-09-08 ENCOUNTER — Other Ambulatory Visit: Payer: Medicare PPO

## 2014-09-08 ENCOUNTER — Inpatient Hospital Stay: Payer: Medicare PPO | Attending: Internal Medicine

## 2014-09-08 ENCOUNTER — Inpatient Hospital Stay (HOSPITAL_BASED_OUTPATIENT_CLINIC_OR_DEPARTMENT_OTHER): Payer: Medicare PPO | Admitting: Internal Medicine

## 2014-09-08 ENCOUNTER — Ambulatory Visit: Payer: Medicare PPO | Admitting: Internal Medicine

## 2014-09-08 VITALS — BP 165/74 | HR 53 | Temp 96.2°F | Resp 18 | Ht 71.0 in | Wt 210.3 lb

## 2014-09-08 DIAGNOSIS — M129 Arthropathy, unspecified: Secondary | ICD-10-CM | POA: Insufficient documentation

## 2014-09-08 DIAGNOSIS — Z87891 Personal history of nicotine dependence: Secondary | ICD-10-CM

## 2014-09-08 DIAGNOSIS — N184 Chronic kidney disease, stage 4 (severe): Secondary | ICD-10-CM

## 2014-09-08 DIAGNOSIS — Z79899 Other long term (current) drug therapy: Secondary | ICD-10-CM | POA: Insufficient documentation

## 2014-09-08 DIAGNOSIS — E119 Type 2 diabetes mellitus without complications: Secondary | ICD-10-CM

## 2014-09-08 DIAGNOSIS — Z9221 Personal history of antineoplastic chemotherapy: Secondary | ICD-10-CM | POA: Diagnosis not present

## 2014-09-08 DIAGNOSIS — C9110 Chronic lymphocytic leukemia of B-cell type not having achieved remission: Secondary | ICD-10-CM

## 2014-09-08 DIAGNOSIS — I120 Hypertensive chronic kidney disease with stage 5 chronic kidney disease or end stage renal disease: Secondary | ICD-10-CM | POA: Insufficient documentation

## 2014-09-08 DIAGNOSIS — Z794 Long term (current) use of insulin: Secondary | ICD-10-CM | POA: Diagnosis not present

## 2014-09-08 DIAGNOSIS — D696 Thrombocytopenia, unspecified: Secondary | ICD-10-CM | POA: Diagnosis not present

## 2014-09-08 DIAGNOSIS — I10 Essential (primary) hypertension: Secondary | ICD-10-CM | POA: Insufficient documentation

## 2014-09-08 DIAGNOSIS — C8300 Small cell B-cell lymphoma, unspecified site: Secondary | ICD-10-CM

## 2014-09-08 DIAGNOSIS — D649 Anemia, unspecified: Secondary | ICD-10-CM | POA: Insufficient documentation

## 2014-09-08 DIAGNOSIS — Z992 Dependence on renal dialysis: Secondary | ICD-10-CM | POA: Diagnosis not present

## 2014-09-08 DIAGNOSIS — C8590 Non-Hodgkin lymphoma, unspecified, unspecified site: Secondary | ICD-10-CM

## 2014-09-08 LAB — HEPATIC FUNCTION PANEL
ALBUMIN: 4.5 g/dL (ref 3.5–5.0)
ALT: 27 U/L (ref 17–63)
AST: 22 U/L (ref 15–41)
Alkaline Phosphatase: 87 U/L (ref 38–126)
Bilirubin, Direct: <0.1 mg/dL — ABNORMAL LOW (ref 0.1–0.5)
TOTAL PROTEIN: 6.9 g/dL (ref 6.5–8.1)
Total Bilirubin: 0.4 mg/dL (ref 0.3–1.2)

## 2014-09-08 LAB — CBC WITH DIFFERENTIAL/PLATELET
BASOS PCT: 1 %
Basophils Absolute: 0 10*3/uL (ref 0–0.1)
EOS PCT: 5 %
Eosinophils Absolute: 0.2 10*3/uL (ref 0–0.7)
HCT: 31.4 % — ABNORMAL LOW (ref 40.0–52.0)
Hemoglobin: 10.5 g/dL — ABNORMAL LOW (ref 13.0–18.0)
LYMPHS PCT: 28 %
Lymphs Abs: 1.4 10*3/uL (ref 1.0–3.6)
MCH: 30.8 pg (ref 26.0–34.0)
MCHC: 33.4 g/dL (ref 32.0–36.0)
MCV: 92.4 fL (ref 80.0–100.0)
Monocytes Absolute: 0.5 10*3/uL (ref 0.2–1.0)
Monocytes Relative: 10 %
Neutro Abs: 2.8 10*3/uL (ref 1.4–6.5)
Neutrophils Relative %: 56 %
Platelets: 160 10*3/uL (ref 150–440)
RBC: 3.4 MIL/uL — ABNORMAL LOW (ref 4.40–5.90)
RDW: 14.5 % (ref 11.5–14.5)
WBC: 4.9 10*3/uL (ref 3.8–10.6)

## 2014-09-08 LAB — LACTATE DEHYDROGENASE: LDH: 179 U/L (ref 98–192)

## 2014-09-09 ENCOUNTER — Telehealth: Payer: Self-pay | Admitting: Urology

## 2014-09-09 NOTE — Telephone Encounter (Signed)
I called and spoke w/ Mark [name] to confirm that they received the cardiac clearance that was faxed over Monday, June 6th to stop his ASA 325MG  prior to circumcision. She put in a message to [place] (Dr. Saralyn Pilar nurse) to call me back.

## 2014-09-14 ENCOUNTER — Ambulatory Visit (INDEPENDENT_AMBULATORY_CARE_PROVIDER_SITE_OTHER): Payer: Medicare PPO | Admitting: Podiatry

## 2014-09-14 ENCOUNTER — Encounter: Payer: Self-pay | Admitting: Podiatry

## 2014-09-14 VITALS — BP 143/50 | HR 60 | Resp 16

## 2014-09-14 DIAGNOSIS — L97521 Non-pressure chronic ulcer of other part of left foot limited to breakdown of skin: Secondary | ICD-10-CM

## 2014-09-14 DIAGNOSIS — L89891 Pressure ulcer of other site, stage 1: Secondary | ICD-10-CM

## 2014-09-14 NOTE — Progress Notes (Signed)
Presents today for follow-up of ulceration sub-fifth metatarsal head of his left foot. He denies fever chills nausea vomiting muscle pains. Continues to dress the foot daily. He's has stated that he has stopped soaking the foot because it makes it look worse.  Objective: Pulses are palpable left. Ulceration demonstrates reactive hyperkeratosis surrounding the wound once debrided demonstrate an area of complete skin breakdown of only about 5 mm in diameter. There is no purulence no malodor.  Assessment: Chronic diabetic ulceration sub-fifth metatarsal head of the left foot.  Plan: Debridement of all reactive hyperkeratosis today we'll follow-up with him in 1 month. Continue conservative therapies.

## 2014-09-27 ENCOUNTER — Encounter: Payer: Self-pay | Admitting: Internal Medicine

## 2014-09-27 DIAGNOSIS — C8590 Non-Hodgkin lymphoma, unspecified, unspecified site: Secondary | ICD-10-CM

## 2014-09-27 HISTORY — DX: Non-Hodgkin lymphoma, unspecified, unspecified site: C85.90

## 2014-09-27 NOTE — Progress Notes (Signed)
[facility]  [phone] [phone]     ID: Mark [name]. 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 Bone marrow biopsy on 03/05/13  with mildly hypercellular marrow for age 69-50% with interstitial predominantly small B-lymphocytic infiltrate estimated about 20-30% of marrow cells, mild nonspecific dyserythropoiesis, storage iron present this.  Flow cytometry showed 29% CD5+ clonal B-cell population which is CD45+, CD5+, CD19+, CD20+, CD22+, CD23+, CD38+, HLA-DR+, Slg kappa+, and is CD10-, CD11b-, FMC7-.  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 PET scan on 03/03/13 with extensive hypermetabolic  lymphadenopathy.  (presented with progressive Thrombocytopenia with persistent Anemia. CBC on 01/16/13 showed hemoglobin 10.0, MCV 98, platelets 80, WBC 4890 with 40% neutrophils, 54% lymphocytes, 1.4% monocytes. On Epogen and Venofer at dialysis treatments.  Further workup showed small M-spike of 0.2).    Got Treanda/Rituxan x2 cycles in Dec 2014/Jan 2015. Then on single agent Rituxan q 8 weeks (got #3 dose on 09/16/13).  HISTORY OF PRESENT ILLNESS:  Patient returns for continued oncology followup, he was last seen 6 months ago. He had been on maintenance Rituxan treatment but then was diagnosed with superficial bladder cancer and has been undergoing BCG therapy, therefore Rituxan treatment was held. States that he is doing fairly steady and eating well. Denies new complaints. He denies any blood in the urine, states that he is due for followup cystoscopy in the near future and has been told that he will need more BCG treatments if he still has residual bladder cancer. He last received Rituxan treatment on September 16, 2013. Denies feeling any recurrent lymph node  masses in the neck or other areas on self exam. Denies new bone pains. No fevers or major night sweats. Continues on dialysis.   REVIEW OF SYSTEMS:   ROS As in HPI above. In addition, no fever, chills or sweats. No new headaches or focal weakness.  No new sore throat, cough, shortness of breath, sputum, hemoptysis or chest pain. No dizziness or palpitation. No abdominal pain, constipation, diarrhea, dysuria or hematuria. No new skin rash or bleeding symptoms. No new paresthesias in extremities.   PAST MEDICAL HISTORY: Reviewed. Past Medical History  Diagnosis Date  . Hypertension   . Diabetes mellitus without complication   . Arthritis   . Chronic kidney disease   . 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 As above  FAMILY HISTORY: Reviewed. Diabetes, hypertension.  Brother with prostate cancer.  Mother had melanoma to bone.  Cousin with kidney cancer.  Denies hematological disorders.  ADVANCED DIRECTIVES:  Yes  SOCIAL HISTORY: Reviewed. History  Substance Use Topics  . Smoking status: Former Research scientist (life sciences)  . Smokeless tobacco: Never Used     Comment: quit  . Alcohol Use: No  Ex-smoker, quit 2013, 50-pack-year history.  Occasional alcohol intake.  Denies recreational drug usage.  Limited ambulation due to to medical problems.  Allergies  Allergen Reactions  . Daypro [Oxaprozin] Swelling and Rash    Current Outpatient Prescriptions  Medication Sig Dispense Refill  . amLODipine (NORVASC) 10 MG tablet Take 10 mg by mouth daily.    [name] amoxicillin-clavulanate (AUGMENTIN) 875-125 MG per tablet Take 1 tablet  by mouth 2 (two) times daily. 20 tablet 1  . CALCITRIOL PO Take by mouth.    [name] CALCIUM ACETATE, PHOS BINDER, PO Take by mouth.    . cefUROXime (CEFTIN) 250 MG tablet Take 250 mg by mouth 2 (two) times daily with a meal.    . Doxercalciferol  (HECTOROL PO) Take by mouth.    . furosemide (LASIX) 80 MG tablet Take 80 mg by mouth.    [name] glimepiride (AMARYL) 2 MG tablet Take 2 mg by mouth daily with breakfast.    . hydrOXYzine (VISTARIL) 25 MG capsule Take 25 mg by mouth 3 (three) times daily as needed.    . Insulin Detemir (LEVEMIR [place]) Inject into the skin daily.    [name] lovastatin (MEVACOR) 20 MG tablet Take 20 mg by mouth at bedtime.    . metoCLOPramide (REGLAN) 5 MG tablet Take 5 mg by mouth 4 (four) times daily.    . metoprolol (LOPRESSOR) 100 MG tablet Take 100 mg by mouth 2 (two) times daily.    . mupirocin ointment (BACTROBAN) 2 % Apply to affected area twice daily. 22 g 11  . nystatin-triamcinolone (MYCOLOG II) cream Apply 1 application topically 2 (two) times daily.    . RiTUXimab (RITUXAN IV) Inject into the vein.    . saxagliptin HCl (ONGLYZA) 5 MG TABS tablet Take by mouth daily.     No current facility-administered medications for this visit.    PHYSICAL EXAM: Filed Vitals:   09/08/14 1551  BP:   Pulse:   Temp:   Resp: 18     Body mass index is 29.35 kg/(m^2).    ECOG FS:2 - Symptomatic, <50% confined to bed  GENERAL: Patient is alert and oriented and in no acute distress. There is no icterus. HEENT: EOMs intact. Oral exam negative for thrush or lesions. No cervical lymphadenopathy. CVS: S1S2, regular LUNGS: Bilaterally clear to auscultation, no rhonchi. ABDOMEN: Soft, nontender. No hepatosplenomegaly clinically. EXTREMITIES: No pedal edema. LYMPHATICS: No palpable adenopathy in axillary or inguinal areas.   LAB RESULTS:    Component Value Date/Time   NA 140 11/18/2013 1030   K 4.0 11/18/2013 1030   CL 97* 11/18/2013 1030   CO2 33* 11/18/2013 1030   GLUCOSE 265* 11/18/2013 1030   BUN 30* 11/18/2013 1030   CREATININE 5.05* 11/18/2013 1030   CALCIUM 8.6 11/18/2013 1030   PROT 6.9 09/08/2014 1504   PROT 7.0 11/18/2013 1030   ALBUMIN 4.5 09/08/2014 1504   ALBUMIN 3.7 11/18/2013 1030   AST 22 09/08/2014  1504   AST 15 11/18/2013 1030   ALT 27 09/08/2014 1504   ALT 25 11/18/2013 1030   ALKPHOS 87 09/08/2014 1504   ALKPHOS 105 11/18/2013 1030   BILITOT 0.4 09/08/2014 1504   GFRNONAA 11* 11/18/2013 1030   GFRAA 13* 11/18/2013 1030   Lab Results  Component Value Date   WBC 4.9 09/08/2014   NEUTROABS 2.8 09/08/2014   HGB 10.5* 09/08/2014   HCT 31.4* 09/08/2014   MCV 92.4 09/08/2014   PLT 160 09/08/2014     STUDIES: 05/19/14 - CT scan of the chest/abdomen/pelvis. IMPRESSION: 1. Resolution of mediastinal lymphadenopathy. 2. Mild reticulation at lung bases with  resolution pleural fluid. AbdomenPelvis Impression: 1. No lymphadenopathy. 2. Normal volume spleen. 3. Chronic perinephric stranding involving the kidneys. 4. Stable indeterminate density right renal lesions.  05/19/14 - CT scan of the neck. IMPRESSION:  Reduction in the level 3 lymphadenopathy compared to CT 05/23/2013. No lymphadenopathy remaining.  STAGING: Lymphoma  Staging form: Lymphoid Neoplasms, AJCC 6th Edition     Clinical stage from 09/27/2014: Stage IV - Signed by Leia Alf, MD on 09/27/2014   ASSESSMENT / PLAN:   1. 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  Bone marrow biopsy on 03/05/13 with interstitial predominantly small B-lymphocytic infiltrate estimated about 20-30% of marrow cells. Flow cytometry showed 29% CD5+ clonal B-cell population which is CD45+, CD5+, CD19+, CD20+, CD22+, CD23+, CD38+, HLA-DR+, Slg kappa+, and is CD10-, CD11b-, FMC7-.  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  PET scan on 03/03/13 with extensive hypermetabolic  lymphadenopathy. Patient completed 2 cycles of Treanda/Rituxan on January 28, received Neulasta on January 29 but still got hospitalized with significant illness and pneumonia requiring oxygen. Given his compromised functional status, that he is on hemodialysis and has other medical  problems including diabetes, he was then continued on current regimen with maintenance Rituxan given once every 8 weeks (planned for about 2 years as maintenance treatment) -  have reviewed labs and d/w patient. Last CT scan of the neck, chest, abdomen, and pelvis reported that the lymphadenopathy has resolved and there is no evidence of recurrent lymphoma at this time. 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 Wiill schedule for CBC, LDH, LFT and repeat CT scan on Oct 10, and will see him back on Oct 11. Patient explained that this kind of lymphoma has a high risk of relapse in the future and he may need to resume treatment sometime in the future.  2. Anemia - likely multifactorial given that he has end-stage renal disease on hemodialysis and from chemotherapy side effect. Will continue to monitor and consider transfusion support as indicated. 3. End-stage renal disease - He is on hemodialysis. 4. Thrombocytopenia - Mild. No bleeding issues. Continue to monitor. No intervention needed at this time.  In between visits, patient advised to call or come to ER in case of any acute sickness or new symptoms. He is agreeable to this plan.       Leia Alf, MD   09/27/2014 12:07 PM

## 2014-10-08 ENCOUNTER — Ambulatory Visit (INDEPENDENT_AMBULATORY_CARE_PROVIDER_SITE_OTHER): Payer: Medicare PPO | Admitting: Urology

## 2014-10-08 ENCOUNTER — Encounter: Payer: Self-pay | Admitting: Urology

## 2014-10-08 VITALS — BP 174/75 | HR 56 | Ht 71.0 in | Wt 206.0 lb

## 2014-10-08 DIAGNOSIS — C679 Malignant neoplasm of bladder, unspecified: Secondary | ICD-10-CM

## 2014-10-08 DIAGNOSIS — IMO0001 Reserved for inherently not codable concepts without codable children: Secondary | ICD-10-CM

## 2014-10-08 LAB — URINALYSIS, COMPLETE
BILIRUBIN UA: NEGATIVE
Glucose, UA: NEGATIVE
Ketones, UA: NEGATIVE
LEUKOCYTES UA: NEGATIVE
NITRITE UA: NEGATIVE
RBC UA: NEGATIVE
Specific Gravity, UA: 1.02 (ref 1.005–1.030)
Urobilinogen, Ur: 0.2 mg/dL (ref 0.2–1.0)
pH, UA: 8.5 — ABNORMAL HIGH (ref 5.0–7.5)

## 2014-10-08 LAB — MICROSCOPIC EXAMINATION: Bacteria, UA: NONE SEEN

## 2014-10-08 MED ORDER — LIDOCAINE HCL 2 % EX GEL: 1.0000 "application " | Freq: Once | CUTANEOUS | Status: DC

## 2014-10-08 MED ORDER — CIPROFLOXACIN HCL 500 MG PO TABS: 500.0000 mg | ORAL_TABLET | Freq: Once | ORAL | Status: DC

## 2014-10-08 NOTE — Progress Notes (Signed)
Preoperative diagnosis: Phimosis Postoperative diagnosis: Same  Procedure: Circumcision Surgeon: Collier Flowers Do Anesthesia: Gen. with penile block Specimen: foreskin Complications: none [id number]  Indications: The patient was recently evaluated for phimosis. All risks and benefits of circumcision discussed. Full informed consent obtained. The patient now presents for definitive procedure.  Technique and findings: The patient was brought to the operating room. Successful induction of general anesthesia.  The patient was then prepped and draped in usual manner. Appropriate surgical timeout was performed. A penile block was then performed with10 mL of quarter percent plain Marcaine. Proximal and distal incision sites were marked with a pen, and appropriate circumferential incisions were created. The sleeve of redundant skin was removed with the bovie. Hemostatis was achieved using electrocautery.Quadrant sutures were placed with interrupted 4-0 chromic, with the phrenular suture being a "U" stitch. In between, the same 4-0 chromic was used to re approximate the skin edges with a running simple stitch.  The incision was wrapped with Xeroform gauze, a plain guaze wrap and Coban dressing. The patient was brought to recovery room in stable condition having had no obvious complications or problems. Sponge and needle counts were correct.

## 2014-10-08 NOTE — Progress Notes (Signed)
10/08/2014 4:33 PM   Mark J Bullard Jr. [birth year] [id number]  Referring provider: Franciso Bend, MD [place], [place] [address]  Chief Complaint  Patient presents with  . Other    procedure cystoscopy bladder cancer,  Circumsion    HPI: Patient has had previous bladder tumor seen in treated with EGD. Cystoscopy today reveals no recurrence. Prostate is small and nonobstructing. Cyanotic redundant foreskin is removed without difficulty. See operative report.     PMH: Past Medical History  Diagnosis Date  . Hypertension   . Diabetes mellitus without complication   . Arthritis   . Chronic kidney disease   . Lymphoma 09/27/2014    Surgical History: No past surgical history on file.  Home Medications:    Medication List       This list is accurate as of: 10/08/14  4:33 PM.  Always use your most recent med list.               ALCOHOL PREP PADS EX     amLODipine 10 MG tablet  Commonly known as:  NORVASC     amoxicillin-clavulanate 875-125 MG per tablet  Commonly known as:  AUGMENTIN  Take 1 tablet by mouth 2 (two) times daily.     azithromycin 250 MG tablet  Commonly known as:  ZITHROMAX     calcitRIOL 0.25 MCG capsule  Commonly known as:  ROCALTROL  Take by mouth.     CALCIUM ACETATE (PHOS BINDER) PO  Take by mouth.     Calcium-Vitamin D 600-200 MG-UNIT per tablet  Take by mouth.     cefUROXime 250 MG tablet  Commonly known as:  CEFTIN  Take 250 mg by mouth 2 (two) times daily with a meal.     ENSURE PLUS Liqd  Take by mouth.     furosemide 80 MG tablet  Commonly known as:  LASIX  Take 80 mg by mouth.     Ginseng 100 MG Caps  Take 100 mg by mouth.     glimepiride 4 MG tablet  Commonly known as:  AMARYL     HCA TRIPLE ANTIBIOTIC OINTMENT EX     HECTOROL PO  Take by mouth.     HECTOROL 4 MCG/2ML injection  Generic drug:  doxercalciferol  Inject into the vein.     HYDROcodone-acetaminophen 5-325 MG per tablet  Commonly  known as:  NORCO/VICODIN     hydrOXYzine 25 MG tablet  Commonly known as:  ATARAX/VISTARIL  Take by mouth.     LEVEMIR FLEXTOUCH 100 UNIT/ML Pen  Generic drug:  Insulin Detemir     lovastatin 20 MG tablet  Commonly known as:  MEVACOR  Take 20 mg by mouth at bedtime.     metoCLOPramide 5 MG tablet  Commonly known as:  REGLAN  Take 5 mg by mouth 4 (four) times daily.     metoprolol 100 MG tablet  Commonly known as:  LOPRESSOR  Qam day not on dialysis, Qpm day of dialysis     mupirocin ointment 2 %  Commonly known as:  BACTROBAN  Apply to affected area twice daily.     nystatin 100000 UNITS vaginal tablet  Place vaginally.     nystatin-triamcinolone cream  Commonly known as:  MYCOLOG II  Apply 1 application topically 2 (two) times daily.     nystatin-triamcinolone cream  Commonly known as:  MYCOLOG II  Apply topically.     ONGLYZA 5 MG Tabs tablet  Generic drug:  saxagliptin HCl  Take by mouth daily.     oxybutynin 5 MG tablet  Commonly known as:  DITROPAN  Take 5 mg by mouth.     Pill Box 7 Day Misc     riTUXimab [id number] 10 MG/ML injection  Commonly known as:  RITUXAN  Inject into the vein.     sodium bicarbonate 650 MG tablet  Take 650 mg by mouth.     TRIPLE ANTIBIOTIC PLUS 1 % Oint  Generic drug:  Neomy-Bacit-Polymyx-Pramoxine     ULTICARE SHORT PEN NEEDLES 31G X 8 MM Misc  Generic drug:  Insulin Pen Needle        Allergies:  Allergies  Allergen Reactions  . Daypro [Oxaprozin] Swelling and Rash    Family History: No family history on file.  Social History:  reports that he has quit smoking. He has never used smokeless tobacco. He reports that he does not drink alcohol or use illicit drugs.  ROS: Urological Symptom Review  Patient is experiencing the following symptoms: Trouble starting stream Have to strain to urinate Weak stream   Review of Systems  Gastrointestinal (upper)  : Negative for upper GI symptoms  Gastrointestinal  (lower) : Negative for lower GI symptoms  Constitutional : Fatigue  Skin: Negative for skin symptoms  Eyes: Negative for eye symptoms  Ear/Nose/Throat : Negative for Ear/Nose/Throat symptoms  Hematologic/Lymphatic: Negative for Hematologic/Lymphatic symptoms  Cardiovascular : Negative for cardiovascular symptoms  Respiratory : Negative for respiratory symptoms  Endocrine: Negative for endocrine symptoms  Musculoskeletal: Back pain Joint pain  Neurological: Negative for neurological symptoms  Psychologic: Negative for psychiatric symptoms   Physical Exam: BP 174/75 mmHg  Pulse 56  Ht 5\' 11"  (1.803 m)  Wt 206 lb (93.441 kg)  BMI 28.74 kg/m2  Constitutional:  Alert and oriented, No acute distress. HEENT: [place] AT, moist mucus membranes.  Trachea midline, no masses. Cardiovascular: No clubbing, cyanosis, or edema. Respiratory: Normal respiratory effort, no increased work of breathing. GI: Abdomen is soft, nontender, nondistended, no abdominal masses GU: No CVA tenderness. Uncircumcised penis. Small nonobstructive prostate testicles normal Skin: No rashes, bruises or suspicious lesions. Lymph: No cervical or inguinal adenopathy. Neurologic: Grossly intact, no focal deficits, moving all 4 extremities. Psychiatric: Normal mood and affect.  Laboratory Data: Lab Results  Component Value Date   WBC 4.9 09/08/2014   HGB 10.5* 09/08/2014   HCT 31.4* 09/08/2014   MCV 92.4 09/08/2014   PLT 160 09/08/2014    Lab Results  Component Value Date   CREATININE 5.05* 11/18/2013    No results found for: PSA  No results found for: TESTOSTERONE  No results found for: HGBA1C  Urinalysis No results found for: COLORURINE, APPEARANCEUR, LABSPEC, PHURINE, GLUCOSEU, HGBUR, BILIRUBINUR, KETONESUR, PROTEINUR, UROBILINOGEN, NITRITE, LEUKOCYTESUR  Pertinent Imaging: none Assessment & Plan:  No recurrent bladder tumor. Phimosis and circumcision done for same. Follow-up in 3  months .  1. Malignant neoplasm of urinary bladder, unspecified site  - Urinalysis, Complete   No Follow-up on file.  Collier Flowers, [facility] [address] [place], [place] [address] [phone]

## 2014-10-08 NOTE — Progress Notes (Signed)
    Cystoscopy Procedure Note  Patient identification was confirmed, informed consent was obtained, and patient was prepped using Betadine solution.  Lidocaine jelly was administered per urethral meatus.    Preoperative abx where received prior to procedure.     Pre-Procedure: - Inspection reveals a normal caliber ureteral meatus.  Procedure: The flexible cystoscope was introduced without difficulty - No urethral strictures/lesions are present. - Enlarged prostate - Normal bladder neck - Bilateral ureteral orifices identified - Bladder mucosa  reveals no ulcers, tumors, or lesions - No bladder stones - No trabeculation  Retroflexion shows no recurrent bladder tumors   Post-Procedure: - Patient tolerated the procedure well

## 2014-10-08 NOTE — Addendum Note (Signed)
Addended by: Wilson Singer on: 10/08/2014 05:32 PM   Modules accepted: Orders

## 2014-10-09 ENCOUNTER — Ambulatory Visit: Payer: Self-pay | Admitting: Urology

## 2014-10-12 ENCOUNTER — Encounter: Payer: Self-pay | Admitting: *Deleted

## 2014-10-12 ENCOUNTER — Telehealth: Payer: Self-pay | Admitting: Urology

## 2014-10-12 ENCOUNTER — Ambulatory Visit (INDEPENDENT_AMBULATORY_CARE_PROVIDER_SITE_OTHER): Payer: Medicare HMO | Admitting: Urology

## 2014-10-12 VITALS — BP 159/72 | HR 63 | Ht 71.0 in

## 2014-10-12 DIAGNOSIS — N4889 Other specified disorders of penis: Secondary | ICD-10-CM

## 2014-10-12 DIAGNOSIS — N501 Vascular disorders of male genital organs: Secondary | ICD-10-CM

## 2014-10-12 NOTE — Telephone Encounter (Signed)
Spoke with patient's daughter and she states that patient is still experiencing some pain and bleeding post circumcision last Thursday. I explained that some discomfort and bleeding is normal but his bleeding should be getting less as time goes on. Patient and daughter stated that he is still bleeding as much as the day of, they state is is soaking the front of his depends with blood and he is changing them twice daily. We asked for the patient to come to the office for [place] to check the incision site. Patient is going to dialysis now but will be done this afternoon and could make it to the office between 3:30-4pm. Spoke with Dr. Jeffie Pollock in regards to this patient and he states he will be glad to examine him to check the site.

## 2014-10-12 NOTE — Progress Notes (Signed)
Patient ID: Mark [name]., male   DOB: 04-Jun-1945, 69 y.o.   MRN: [id number]   Mark [name] returns today with bleeding from his circumcision site.   On exam he still has some dried xeroform in place and there is evidence of recent bleeding from the right lateral incision but significant active bleeding was not noted.  He doesn't appear to have a hematoma.  I changed the dressing with fresh xeroform and cling.    I encouraged him to leave the dressing in place for 48hrs and then remove it.   If the bleeding doesn't resolve he will need to return to the OR for evaluation but I don't think he needs that now.

## 2014-10-19 ENCOUNTER — Ambulatory Visit: Payer: Medicare PPO | Admitting: Podiatry

## 2014-10-22 ENCOUNTER — Ambulatory Visit: Payer: Medicare PPO | Admitting: Podiatry

## 2014-10-22 ENCOUNTER — Ambulatory Visit (INDEPENDENT_AMBULATORY_CARE_PROVIDER_SITE_OTHER): Payer: Medicare PPO | Admitting: Podiatry

## 2014-10-22 ENCOUNTER — Ambulatory Visit (INDEPENDENT_AMBULATORY_CARE_PROVIDER_SITE_OTHER): Payer: Medicare PPO

## 2014-10-22 DIAGNOSIS — M79662 Pain in left lower leg: Secondary | ICD-10-CM

## 2014-10-22 DIAGNOSIS — L97421 Non-pressure chronic ulcer of left heel and midfoot limited to breakdown of skin: Secondary | ICD-10-CM | POA: Diagnosis not present

## 2014-10-22 DIAGNOSIS — L89891 Pressure ulcer of other site, stage 1: Secondary | ICD-10-CM | POA: Diagnosis not present

## 2014-10-22 DIAGNOSIS — L97521 Non-pressure chronic ulcer of other part of left foot limited to breakdown of skin: Secondary | ICD-10-CM

## 2014-10-22 NOTE — Progress Notes (Signed)
Patient ID: Mark [name]., male   DOB: 1945/04/15, 69 y.o.   MRN: [id number]  Subjective: 69 year old male presents the office today with his daughter for concerns of left foot wound. He states that he is certainly having more pain to the wound of the last couple of days and from the wound going up his leg. He also states he has a knot in his calf. He states his calf is painful. His daughter is concerned that he may have a blood clot. Denies any swelling or redness of the calf. Denies any swelling or redness from the wound denies any drainage. No other complaints at this time. Denies any systemic complaints as fevers, chills, nausea, vomiting.  Objective: AAO 3, NAD DP/PT pulses palpable, CRT less than 3 seconds Neurological status decreased Left foot has an ulceration sub-metatarsal 5 with a hyperkeratotic periwound. Upon debridement ulceration measures 0.6 x 0.5 cm. There is no probe to bone, undermining, tunneling. There is no surrounding erythema, ascending cellulitis, fluctuation, crepitus, malodor. There is no increase in warmth to the foot. On the posterior aspect of the calf there are 2 knots present. There is mild pain with calf compression. There is no edema or increased warmth. No other areas of tenderness to bilateral lower extremities. No other open lesions or pre-ulcer lesions.  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 The wound was debrided to healthy, bleeding, granular tissue. I does or was applied followed by dry sterile dressing. Continue daily dressing changes. Monitor for any clinical signs or symptoms of infection and directed to call the office immediately should any occur or go to the ER. -Discussed with the patient he does have a venous duplex today to rule out a DVT. We try  contacting the vascular office however we were unsuccessful. The patient as he is existing patient went to the office himself. His daughter states that if they are unable to get the venous duplex in the office today they'll go to the emergency room. -Follow-up 2 weeks or sooner if any problems arise. In the meantime, encouraged to call the office with any questions, concerns, change in symptoms.   Celesta Gentile, DPM

## 2014-10-22 NOTE — Patient Instructions (Signed)
Continue daily dressing changes as discussed.

## 2014-10-28 ENCOUNTER — Ambulatory Visit: Payer: Medicare PPO | Admitting: Podiatry

## 2014-11-03 ENCOUNTER — Ambulatory Visit (INDEPENDENT_AMBULATORY_CARE_PROVIDER_SITE_OTHER): Payer: Medicare PPO | Admitting: Podiatry

## 2014-11-03 ENCOUNTER — Ambulatory Visit (INDEPENDENT_AMBULATORY_CARE_PROVIDER_SITE_OTHER): Payer: Medicare PPO

## 2014-11-03 ENCOUNTER — Telehealth: Payer: Self-pay | Admitting: *Deleted

## 2014-11-03 VITALS — Temp 98.5°F

## 2014-11-03 DIAGNOSIS — M792 Neuralgia and neuritis, unspecified: Secondary | ICD-10-CM

## 2014-11-03 DIAGNOSIS — M25572 Pain in left ankle and joints of left foot: Secondary | ICD-10-CM

## 2014-11-03 DIAGNOSIS — L97521 Non-pressure chronic ulcer of other part of left foot limited to breakdown of skin: Secondary | ICD-10-CM | POA: Diagnosis not present

## 2014-11-03 MED ORDER — GABAPENTIN 100 MG PO CAPS: 100.0000 mg | ORAL_CAPSULE | Freq: Every day | ORAL | Status: DC

## 2014-11-03 MED ORDER — CEPHALEXIN 500 MG PO CAPS: 500.0000 mg | ORAL_CAPSULE | Freq: Three times a day (TID) | ORAL | Status: DC

## 2014-11-03 MED ORDER — GABAPENTIN 100 MG PO CAPS: 100.0000 mg | ORAL_CAPSULE | Freq: Three times a day (TID) | ORAL | Status: DC

## 2014-11-03 NOTE — Telephone Encounter (Signed)
After investigating chart, it was found that patient had ulcer debridement by podiatry last week and that he had the knot and pain at that time. I called her back and asked if she had contacted Annice Needy in podiatry and she said she had not. I asked that she start by contacting their office she said she understood and will call us back if she gets no resolve there

## 2014-11-03 NOTE — Telephone Encounter (Signed)
Discussed with Dr Grayland Ormond and he is in agreement with this plan

## 2014-11-03 NOTE — Telephone Encounter (Signed)
Reports that he had an [place] last week which was negative for DVT and was told to watch for lymph nodes. He called daughter this morning and told her he did not sleep last night due to pain form his foot to his thigh. He has developed 2 knot in the mid portion of his leg and continues to have severe pain. (He gets dialysis M-W-F)

## 2014-11-03 NOTE — Progress Notes (Signed)
Patient ID: Regenia Skeeter., male   DOB: [birth year], 69 y.o.   MRN: [id number]  Subjective: 69 year old male presents the office today with his daughter for concerns of left foot wound and for sharp pains to his feet that go up to his thigh. Since last appointment he went to the emergency room had a venous duplex which was apparently negative for blood clot. He states that the pain has intensified since last appointment he is having more sharp shooting pain up his leg. He states at times it does is coming from the wound although to the ankles well going off to the side. He feels that he gets electric shock-type symptoms. He's a daily dressing changes with mupirocin ointment to the wound daily. Denies any drainage or any redness from around the area. No other complaints at this time. Denies any systemic complaints as fevers, chills, nausea, vomiting.  Objective: AAO 3, NAD DP/PT pulses palpable, CRT less than 3 seconds Neurological status decreased Left foot has an ulceration sub-metatarsal 5 with a hyperkeratotic periwound. Upon debridement ulceration measures 0.5 x 0.5 cm. There is no probe to bone, undermining, tunneling. There is no surrounding erythema, ascending cellulitis, fluctuation, crepitus, malodor. There is no increase in warmth to the foot. There is trace edema overlying the left foot or forefoot. At this appointment, there is no pain with calf compression, swelling, warmth, erythema. No other areas of tenderness to bilateral lower extremities. No other open lesions or pre-ulcer lesions.  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  -Treatment options discussed including all alternatives, risks, and complications -Wound was sharply debrided without complications. The wound was debrided to healthy, bleeding, granular tissue. Iodosorb was applied followed  by dry sterile dressing. Continue daily dressing changes. Monitor for any clinical signs or symptoms of infection and directed to call the office immediately should any occur or go to the ER. -Rx Keflex in case of infection, although clinically he does not have any signs other than the mild swelling. -For what appears to be nerve pain, prescribed gabapentin 100mg  at night. Discussed with him and his daughter how to slowly increase it to 300 mg at nighttime as tolerated. He is unable to tolerate the medication to call the office.  -I modified his insert to help offload the wound more.  -Follow-up 2 weeks or sooner if any problems arise. In the meantime, encouraged to call the office with any questions, concerns, change in symptoms.   Celesta Gentile, DPM

## 2014-11-05 ENCOUNTER — Ambulatory Visit: Payer: Medicare PPO | Admitting: Podiatry

## 2014-11-10 ENCOUNTER — Ambulatory Visit (INDEPENDENT_AMBULATORY_CARE_PROVIDER_SITE_OTHER): Payer: Medicare HMO | Admitting: Urology

## 2014-11-10 ENCOUNTER — Encounter: Payer: Self-pay | Admitting: Urology

## 2014-11-10 VITALS — BP 141/69 | HR 52 | Ht 71.0 in | Wt 209.5 lb

## 2014-11-10 DIAGNOSIS — Z8551 Personal history of malignant neoplasm of bladder: Secondary | ICD-10-CM | POA: Diagnosis not present

## 2014-11-10 NOTE — Progress Notes (Signed)
11/10/2014 4:37 PM   Mark J Eberlein Jr. Apr 10, 1945 [id number]  Referring provider: Franciso Bend, MD [place], [place] [address]  Chief Complaint  Patient presents with  . Routine Post Op    circumcision     HPI: patient postop 3 weeks circumcision. Very pleased with the result. He hasn't any trouble voiding now and can find his glans. Does not void very much course because of renal failure on dialysis. He's had a previous history of bladder cancer free of bladder cancer for the last 2 cystoscopies. We will recheck him again in 2 months. HPI   PMH: Past Medical History  Diagnosis Date  . Hypertension   . Diabetes mellitus without complication   . Arthritis   . Chronic kidney disease   . Lymphoma 09/27/2014    Surgical History: Past Surgical History  Procedure Laterality Date  . Circumcision      Home Medications:    Medication List       This list is accurate as of: 11/10/14  4:37 PM.  Always use your most recent med list.               albuterol 108 (90 BASE) MCG/ACT inhaler  Commonly known as:  PROVENTIL HFA;VENTOLIN HFA  Inhale into the lungs.     ALCOHOL PREP PADS EX     amLODipine 10 MG tablet  Commonly known as:  NORVASC     calcitRIOL 0.25 MCG capsule  Commonly known as:  ROCALTROL  Take by mouth.     CALCIUM ACETATE (PHOS BINDER) PO  Take by mouth.     CALTRATE 600 PLUS-VIT D PO  Take by mouth.     cefUROXime 250 MG tablet  Commonly known as:  CEFTIN  Take 250 mg by mouth 2 (two) times daily with a meal.     cephALEXin 500 MG capsule  Commonly known as:  KEFLEX  Take 1 capsule (500 mg total) by mouth 3 (three) times daily.     ENSURE PLUS Liqd  Take by mouth.     furosemide 80 MG tablet  Commonly known as:  LASIX  Take 80 mg by mouth.     gabapentin 100 MG capsule  Commonly known as:  NEURONTIN  Take 1 capsule (100 mg total) by mouth at bedtime.     Ginseng 100 MG Caps  Take 100 mg by mouth.     glimepiride 4  MG tablet  Commonly known as:  AMARYL     HCA TRIPLE ANTIBIOTIC OINTMENT EX     HECTOROL PO  Take by mouth.     HECTOROL 4 MCG/2ML injection  Generic drug:  doxercalciferol  Inject into the vein.     HYDROcodone-acetaminophen 5-325 MG per tablet  Commonly known as:  NORCO/VICODIN     hydrOXYzine 25 MG tablet  Commonly known as:  ATARAX/VISTARIL  Take by mouth.     LEVEMIR FLEXTOUCH 100 UNIT/ML Pen  Generic drug:  Insulin Detemir     lovastatin 20 MG tablet  Commonly known as:  MEVACOR  Take 20 mg by mouth at bedtime.     metoCLOPramide 5 MG tablet  Commonly known as:  REGLAN  Take 5 mg by mouth 4 (four) times daily.     metoprolol 100 MG tablet  Commonly known as:  LOPRESSOR  Qam day not on dialysis, Qpm day of dialysis     mupirocin ointment 2 %  Commonly known as:  [employer]  to affected area twice daily.     nystatin 100000 UNITS vaginal tablet  Place vaginally.     nystatin-triamcinolone cream  Commonly known as:  MYCOLOG II  Apply 1 application topically 2 (two) times daily.     ONGLYZA 5 MG Tabs tablet  Generic drug:  saxagliptin HCl  Take by mouth daily.     oxybutynin 5 MG tablet  Commonly known as:  DITROPAN  Take 5 mg by mouth.     Pill Box 7 Day Misc     riTUXimab [id number] 10 MG/ML injection  Commonly known as:  RITUXAN  Inject into the vein.     sodium bicarbonate 650 MG tablet  Take 650 mg by mouth.     TRIPLE ANTIBIOTIC PLUS 1 % Oint  Generic drug:  Neomy-Bacit-Polymyx-Pramoxine     ULTICARE SHORT PEN NEEDLES 31G X 8 MM Misc  Generic drug:  Insulin Pen Needle        Allergies:  Allergies  Allergen Reactions  . Daypro [Oxaprozin] Swelling and Rash    Family History: Family History  Problem Relation Age of Onset  . Kidney cancer Neg Hx   . Prostate cancer Neg Hx   . Kidney failure Neg Hx   . Bladder Cancer Neg Hx     Social History:  reports that he has quit smoking. He has never used smokeless tobacco. He reports  that he does not drink alcohol or use illicit drugs.  ROS: UROLOGY Frequent Urination?: No Hard to postpone urination?: No Burning/pain with urination?: No Get up at night to urinate?: No Leakage of urine?: Yes Urine stream starts and stops?: No Trouble starting stream?: No Do you have to strain to urinate?: No Blood in urine?: No Urinary tract infection?: No Sexually transmitted disease?: No Injury to kidneys or bladder?: No Painful intercourse?: No Weak stream?: No Erection problems?: No Penile pain?: No  Gastrointestinal Nausea?: No Vomiting?: No Indigestion/heartburn?: Yes Diarrhea?: No Constipation?: No  Constitutional Fever: No Night sweats?: No Weight loss?: No Fatigue?: Yes  Skin Skin rash/lesions?: No Itching?: Yes  Eyes Blurred vision?: Yes Double vision?: No  Ears/Nose/Throat Sore throat?: No Sinus problems?: No  Hematologic/Lymphatic Swollen glands?: Yes Easy bruising?: Yes  Cardiovascular Leg swelling?: Yes Chest pain?: No  Respiratory Cough?: Yes Shortness of breath?: Yes  Endocrine Excessive thirst?: No  Musculoskeletal Back pain?: No Joint pain?: Yes  Neurological Headaches?: No Dizziness?: Yes  Psychologic Depression?: No Anxiety?: Yes  Physical Exam: BP 141/69 mmHg  Pulse 52  Ht 5\' 11"  (1.803 m)  Wt 209 lb 8 oz (95.029 kg)  BMI 29.23 kg/m2  Constitutional:  Alert and oriented, No acute distress. HEENT: [place] AT, moist mucus membranes.  Trachea midline, no masses. Cardiovascular: No clubbing, cyanosis, or edema. Respiratory: Normal respiratory effort, no increased work of breathing. GI: Abdomen is soft, nontender, nondistended, no abdominal masses GU: No CVA tenderness. Well-healed circumcision Skin: No rashes, bruises or suspicious lesions. Lymph: No cervical or inguinal adenopathy. Neurologic: Grossly intact, no focal deficits, moving all 4 extremities. Psychiatric: Normal mood and affect.  Laboratory Data: Lab  Results  Component Value Date   WBC 4.9 09/08/2014   HGB 10.5* 09/08/2014   HCT 31.4* 09/08/2014   MCV 92.4 09/08/2014   PLT 160 09/08/2014    Lab Results  Component Value Date   CREATININE 5.05* 11/18/2013    No results found for: PSA  No results found for: TESTOSTERONE  No results found for: HGBA1C  Urinalysis    Component  Value Date/Time   COLORURINE Yellow 10/20/2013 1835   APPEARANCEUR Cloudy 10/20/2013 1835   LABSPEC 1.014 10/20/2013 1835   PHURINE 6.0 10/20/2013 1835   GLUCOSEU Negative 10/08/2014 1614   GLUCOSEU 150 mg/dL 10/20/2013 1835   HGBUR Negative 10/20/2013 1835   BILIRUBINUR Negative 10/08/2014 1614   BILIRUBINUR Negative 10/20/2013 1835   KETONESUR Negative 10/20/2013 1835   PROTEINUR 100 mg/dL 10/20/2013 1835   NITRITE Negative 10/08/2014 1614   NITRITE Negative 10/20/2013 1835   LEUKOCYTESUR Negative 10/08/2014 1614   LEUKOCYTESUR 1+ 10/20/2013 1835    Pertinent Imaging: none  Assessment and Plan: follow-up cystoscopy in 2 months  history of bladder cancer     Problem List Items Addressed This Visit    None      No Follow-up on file.  Collier Flowers, [facility] [address], [place], [place] [address] [phone]

## 2014-11-17 ENCOUNTER — Ambulatory Visit (INDEPENDENT_AMBULATORY_CARE_PROVIDER_SITE_OTHER): Payer: Medicare PPO | Admitting: Podiatry

## 2014-11-17 ENCOUNTER — Encounter: Payer: Self-pay | Admitting: Podiatry

## 2014-11-17 VITALS — BP 136/64 | HR 55 | Resp 18

## 2014-11-17 DIAGNOSIS — E1149 Type 2 diabetes mellitus with other diabetic neurological complication: Secondary | ICD-10-CM

## 2014-11-17 DIAGNOSIS — M79676 Pain in unspecified toe(s): Secondary | ICD-10-CM | POA: Diagnosis not present

## 2014-11-17 DIAGNOSIS — B351 Tinea unguium: Secondary | ICD-10-CM

## 2014-11-17 DIAGNOSIS — L97521 Non-pressure chronic ulcer of other part of left foot limited to breakdown of skin: Secondary | ICD-10-CM

## 2014-11-17 NOTE — Progress Notes (Signed)
Patient ID: Mark [name]., male   DOB: [birth year], 69 y.o.   MRN: [id number]  Subjective: 69 year old male presents the office today with his daughter For followup evaluation of a wound to his left foot. He states that he continued in Betadine on the area daily. She states the pain to his feet is greatly improved at this time the gabapentin. He is tolerating 100mg  but it doe make him tired and he does not want to increase the dose. He denies any pus in the wound followed he does get some occasional bloody drainage. Denies any drainage or any redness from around the area. Also states his nails are elongated and caused irritation particularly in shoe gear. No other complaints at this time. Denies any systemic complaints as fevers, chills, nausea, vomiting.  Objective: AAO 3, NAD DP/PT pulses palpable, CRT less than 3 seconds Neurological status decreased Left foot has an ulceration sub-metatarsal 5 with a hyperkeratotic periwound. Upon debridement ulceration measures 0.6 x 0.4 cm.the wound appears to be more superficial. There is no probe to bone, undermining, tunneling. There is no surrounding erythema, ascending cellulitis, fluctuation, crepitus, malodor. There is no increase in warmth to the foot. There is no edema bilaterally.  Nails hypertrophic, dystrophic, brittle, discolored, elongated x10. There is no surrounding erythema or drainage. There is tenderness in nails 1-5 bilaterally. There is no pain with calf compression, swelling, warmth, erythema. No other areas of tenderness to bilateral lower extremities. No other open lesions or pre-ulcer lesions.  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 The wound was debrided to healthy, bleeding, granular tissue. Iodosorb was applied  followed by dry sterile dressing. Continue daily dressing changes. Rx silvadene.  -Continue gabapentin.  -Nails sharply debrided [id number] without complication/bleeding. -Follow-up 2-3 weeks or sooner if any problems arise. In the meantime, encouraged to call the office with any questions, concerns, change in symptoms.   Celesta Gentile, DPM

## 2014-11-18 MED ORDER — SILVER SULFADIAZINE 1 % EX CREA: 1.0000 "application " | TOPICAL_CREAM | Freq: Every day | CUTANEOUS | Status: DC

## 2014-11-18 NOTE — Addendum Note (Signed)
Addended by: Clovis Riley E on: 11/18/2014 01:41 PM   Modules accepted: Orders

## 2014-12-01 ENCOUNTER — Ambulatory Visit: Payer: Medicare PPO | Admitting: Podiatry

## 2014-12-15 ENCOUNTER — Ambulatory Visit (INDEPENDENT_AMBULATORY_CARE_PROVIDER_SITE_OTHER): Payer: Medicare PPO | Admitting: Podiatry

## 2014-12-15 ENCOUNTER — Encounter: Payer: Self-pay | Admitting: Podiatry

## 2014-12-15 VITALS — BP 151/53 | HR 56

## 2014-12-15 DIAGNOSIS — L97521 Non-pressure chronic ulcer of other part of left foot limited to breakdown of skin: Secondary | ICD-10-CM

## 2014-12-15 DIAGNOSIS — L89891 Pressure ulcer of other site, stage 1: Secondary | ICD-10-CM

## 2014-12-15 NOTE — Progress Notes (Signed)
Patient ID: Regenia Skeeter., male   DOB: 08-16-1945, 69 y.o.   MRN: [id number]  Subjective: 69 year old male presents the office today with his daughter for followup evaluation of a wound to his left foot. He states that he continued with Silvadene dressing changes daily. He does get a small amount of bloody drainage intermittently but denies any pus. He denies any pain to the area to the denies any sharp shooting pains. He states the symptoms have resolved. He denies any surrounding redness or red streaks. He has continued on gabapentin at nighttime. No other complaints at this time. Denies any systemic complaints as fevers, chills, nausea, vomiting.  Objective: AAO 3, NAD DP/PT pulses palpable, CRT less than 3 seconds Neurological status decreased Left foot has an ulceration sub-metatarsal 5 with a hyperkeratotic periwound. Upon debridement ulceration measures 0.4 x 0.2 cm.  There is no probe to bone, undermining, tunneling. There is no surrounding erythema, ascending cellulitis, fluctuation, crepitus, malodor. There is no increase in warmth to the foot. There is no edema bilaterally.  There is no pain with calf compression, swelling, warmth, erythema. No other areas of tenderness to bilateral lower extremities.  No other open lesions or pre-ulcer lesions.  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 The wound was debrided to healthy, bleeding, granular tissue. Iodosorb was applied followed by dry sterile dressing. Continue daily dressing changes. Continue with silvadene dressings.  -Monitor for any clinical signs or symptoms of infection and directed to call the office immediately should any occur or go to the ER. -Follow-up in 3 weeks or sooner if any problems arise. In the meantime, encouraged to call the office  with any questions, concerns, change in symptoms.   Celesta Gentile, DPM

## 2014-12-23 ENCOUNTER — Telehealth: Payer: Self-pay | Admitting: *Deleted

## 2014-12-23 NOTE — Telephone Encounter (Signed)
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 Given the location of headache in the right temporal area, please notify PMD to consider neurology evaluation and possibility of temporal arteritis kind of headache. Thanks.

## 2014-12-23 NOTE — Telephone Encounter (Signed)
Spoke with [name] and informed that PMD needs to refer to Neurologist to rule out arteritis She said Dr Brynda Greathouse had mentioned sending him to neurologist and will call his office

## 2014-12-23 NOTE — Telephone Encounter (Signed)
Saw PMD who said to contact Oncology regarding sharp intermittent pains in right temporal area for past 2 weeks. He is scheduled for CT 10/11 of CAP and is inquiring if his head can be scanned too. His PMD had tried treating him for sinusitis, but it did not help the pain

## 2015-01-05 ENCOUNTER — Encounter: Payer: Self-pay | Admitting: Urology

## 2015-01-05 ENCOUNTER — Ambulatory Visit (INDEPENDENT_AMBULATORY_CARE_PROVIDER_SITE_OTHER): Payer: Medicare HMO | Admitting: Urology

## 2015-01-05 VITALS — BP 153/69 | HR 62 | Resp 16 | Ht 71.0 in | Wt 206.2 lb

## 2015-01-05 DIAGNOSIS — Z8551 Personal history of malignant neoplasm of bladder: Secondary | ICD-10-CM

## 2015-01-05 DIAGNOSIS — N521 Erectile dysfunction due to diseases classified elsewhere: Secondary | ICD-10-CM

## 2015-01-05 LAB — MICROSCOPIC EXAMINATION
Bacteria, UA: NONE SEEN
RBC, UA: NONE SEEN /hpf (ref 0–?)

## 2015-01-05 LAB — URINALYSIS, COMPLETE
BILIRUBIN UA: POSITIVE — AB
Glucose, UA: NEGATIVE
Leukocytes, UA: NEGATIVE
Nitrite, UA: NEGATIVE
PH UA: 5.5 (ref 5.0–7.5)
Specific Gravity, UA: 1.015 (ref 1.005–1.030)
UUROB: 0.2 mg/dL (ref 0.2–1.0)

## 2015-01-05 MED ORDER — LIDOCAINE HCL 2 % EX GEL
1.0000 "application " | Freq: Once | CUTANEOUS | Status: AC
  Administered 2015-01-05: 1 via URETHRAL

## 2015-01-05 MED ORDER — CIPROFLOXACIN HCL 500 MG PO TABS
500.0000 mg | ORAL_TABLET | Freq: Once | ORAL | Status: AC
  Administered 2015-01-05: 500 mg via ORAL

## 2015-01-05 NOTE — Progress Notes (Signed)
    Cystoscopy Procedure Note  Patient identification was confirmed, informed consent was obtained, and patient was prepped using Betadine solution.  Lidocaine jelly was administered per urethral meatus.    Preoperative abx where received prior to procedure.     Pre-Procedure: - Inspection reveals a normal caliber ureteral meatus.  Procedure: The flexible cystoscope was introduced without difficulty - No urethral strictures/lesions are present. -  prostate small not obstructive -  bladder neck - Bilateral ureteral orifices identified - Bladder mucosa  reveals no ulcers, tumors, or lesions - No bladder stones - No trabeculation  Retroflexion shows no tumors   Post-Procedure: - Patient tolerated the procedure well

## 2015-01-07 ENCOUNTER — Ambulatory Visit (INDEPENDENT_AMBULATORY_CARE_PROVIDER_SITE_OTHER): Payer: Medicare HMO | Admitting: Podiatry

## 2015-01-07 ENCOUNTER — Encounter: Payer: Self-pay | Admitting: Podiatry

## 2015-01-07 VITALS — BP 149/60 | HR 61 | Resp 18

## 2015-01-07 DIAGNOSIS — M79676 Pain in unspecified toe(s): Secondary | ICD-10-CM

## 2015-01-07 DIAGNOSIS — L97521 Non-pressure chronic ulcer of other part of left foot limited to breakdown of skin: Secondary | ICD-10-CM | POA: Diagnosis not present

## 2015-01-07 DIAGNOSIS — B351 Tinea unguium: Secondary | ICD-10-CM | POA: Diagnosis not present

## 2015-01-07 DIAGNOSIS — E1149 Type 2 diabetes mellitus with other diabetic neurological complication: Secondary | ICD-10-CM

## 2015-01-07 MED ORDER — DOXYCYCLINE HYCLATE 100 MG PO TABS: 100.0000 mg | ORAL_TABLET | Freq: Two times a day (BID) | ORAL | Status: DC

## 2015-01-07 NOTE — Progress Notes (Signed)
Patient ID: Mark [name]., male   DOB: 07/12/1945, 69 y.o.   MRN: [id number]  Subjective: 69 year old male presents the office today with his daughter for followup evaluation of a wound to his left foot. He does that he notices some bloody drainage coming from the wound but denies any pus. He denies any redness or any warmth. He does have that he has had some increase in pain to the wound over the last week. He denies any systemic complaints as fevers, chills, nausea, vomiting. No calf pain, chest pain, shortness of breath.  He has a states his nails are painful, elongated on Ecotrin himself. Denies any swelling redness or drainage.  Objective: AAO 3, NAD DP/PT pulses palpable, CRT less than 3 seconds Neurological status decreased Left foot has an ulceration sub-metatarsal 5 with a hyperkeratotic periwound. Upon debridement ulceration measures 1.2 x 1 cm, which has increased in size since last appointment.  There is no probe to bone, undermining, tunneling. There is a faint amount of erythema around the wound there is an increase in warmth dressing around the wound. There is no ascending cellulitis, fluctuance, crepitus, malodor. There is no drainage or purulence expressed. After debridement the wound base is granular. No other open lesions or pre-ulcerative lesions. There is no pain with calf compression, swelling, warmth, erythema. Nails are hypertrophic, dystrophic, brittle, discolored, elongated 10. There is no surrounding erythema or drainage. There is tenderness to palpation to nails 1-5 bilaterally.  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 The wound was debrided to healthy, bleeding, granular tissue. Iodosorb was applied followed by dry sterile  dressing. Continue daily dressing changes. Continue with silvadene dressings. Prescribed Keflex. Monitor for any clinical signs or symptoms of infection and directed to call the office immediately should any occur or go to the ER. Offloading pads. -Nail sharply debrided 10 without complications/bleeding. -Follow-up in 32weeks or sooner if any problems arise. In the meantime, encouraged to call the office with any questions, concerns, change in symptoms.   Celesta Gentile, DPM

## 2015-01-07 NOTE — Patient Instructions (Signed)
Please have dialysis nurse look at the wound next week due to the mild redness to make sure it is not worsening.  Monitor for any signs/symptoms of infection. Call the office immediately if any occur or go directly to the emergency room. Call with any questions/concerns.

## 2015-01-12 ENCOUNTER — Ambulatory Visit
Admission: RE | Admit: 2015-01-12 | Discharge: 2015-01-12 | Disposition: A | Discharge status: Home / Self Care | Payer: Medicare HMO | Source: Ambulatory Visit | Attending: Internal Medicine | Admitting: Internal Medicine

## 2015-01-12 DIAGNOSIS — I700 Atherosclerosis of aorta: Secondary | ICD-10-CM | POA: Insufficient documentation

## 2015-01-12 DIAGNOSIS — C8590 Non-Hodgkin lymphoma, unspecified, unspecified site: Secondary | ICD-10-CM

## 2015-01-12 DIAGNOSIS — Z8572 Personal history of non-Hodgkin lymphomas: Secondary | ICD-10-CM | POA: Diagnosis present

## 2015-01-12 DIAGNOSIS — N200 Calculus of kidney: Secondary | ICD-10-CM | POA: Diagnosis not present

## 2015-01-12 DIAGNOSIS — Z9221 Personal history of antineoplastic chemotherapy: Secondary | ICD-10-CM | POA: Diagnosis not present

## 2015-01-12 DIAGNOSIS — Z08 Encounter for follow-up examination after completed treatment for malignant neoplasm: Secondary | ICD-10-CM | POA: Insufficient documentation

## 2015-01-12 DIAGNOSIS — R599 Enlarged lymph nodes, unspecified: Secondary | ICD-10-CM | POA: Insufficient documentation

## 2015-01-15 ENCOUNTER — Ambulatory Visit: Payer: Medicare PPO | Admitting: Internal Medicine

## 2015-01-15 ENCOUNTER — Other Ambulatory Visit: Payer: Medicare PPO

## 2015-01-19 ENCOUNTER — Ambulatory Visit (INDEPENDENT_AMBULATORY_CARE_PROVIDER_SITE_OTHER): Payer: Medicare HMO | Admitting: Podiatry

## 2015-01-19 ENCOUNTER — Inpatient Hospital Stay: Payer: Medicare HMO | Attending: Family Medicine

## 2015-01-19 ENCOUNTER — Inpatient Hospital Stay (HOSPITAL_BASED_OUTPATIENT_CLINIC_OR_DEPARTMENT_OTHER): Payer: Medicare HMO | Admitting: Family Medicine

## 2015-01-19 ENCOUNTER — Encounter: Payer: Self-pay | Admitting: Podiatry

## 2015-01-19 VITALS — BP 135/73 | HR 53 | Temp 98.5°F | Wt 207.7 lb

## 2015-01-19 VITALS — BP 140/64 | HR 58 | Resp 18

## 2015-01-19 DIAGNOSIS — N189 Chronic kidney disease, unspecified: Secondary | ICD-10-CM | POA: Insufficient documentation

## 2015-01-19 DIAGNOSIS — C9110 Chronic lymphocytic leukemia of B-cell type not having achieved remission: Secondary | ICD-10-CM | POA: Diagnosis not present

## 2015-01-19 DIAGNOSIS — Z87891 Personal history of nicotine dependence: Secondary | ICD-10-CM | POA: Diagnosis not present

## 2015-01-19 DIAGNOSIS — Z992 Dependence on renal dialysis: Secondary | ICD-10-CM

## 2015-01-19 DIAGNOSIS — Z794 Long term (current) use of insulin: Secondary | ICD-10-CM | POA: Diagnosis not present

## 2015-01-19 DIAGNOSIS — I129 Hypertensive chronic kidney disease with stage 1 through stage 4 chronic kidney disease, or unspecified chronic kidney disease: Secondary | ICD-10-CM | POA: Diagnosis not present

## 2015-01-19 DIAGNOSIS — Z7982 Long term (current) use of aspirin: Secondary | ICD-10-CM | POA: Diagnosis not present

## 2015-01-19 DIAGNOSIS — L97521 Non-pressure chronic ulcer of other part of left foot limited to breakdown of skin: Secondary | ICD-10-CM

## 2015-01-19 DIAGNOSIS — Z79899 Other long term (current) drug therapy: Secondary | ICD-10-CM | POA: Insufficient documentation

## 2015-01-19 DIAGNOSIS — D649 Anemia, unspecified: Secondary | ICD-10-CM | POA: Diagnosis not present

## 2015-01-19 DIAGNOSIS — C8590 Non-Hodgkin lymphoma, unspecified, unspecified site: Secondary | ICD-10-CM

## 2015-01-19 DIAGNOSIS — C679 Malignant neoplasm of bladder, unspecified: Secondary | ICD-10-CM | POA: Diagnosis not present

## 2015-01-19 DIAGNOSIS — E119 Type 2 diabetes mellitus without complications: Secondary | ICD-10-CM | POA: Insufficient documentation

## 2015-01-19 DIAGNOSIS — C9510 Chronic leukemia of unspecified cell type not having achieved remission: Secondary | ICD-10-CM

## 2015-01-19 LAB — CBC WITH DIFFERENTIAL/PLATELET
BASOS PCT: 1 %
Basophils Absolute: 0 10*3/uL (ref 0–0.1)
EOS ABS: 0.3 10*3/uL (ref 0–0.7)
EOS PCT: 5 %
HCT: 37.5 % — ABNORMAL LOW (ref 40.0–52.0)
HEMOGLOBIN: 12.7 g/dL — AB (ref 13.0–18.0)
LYMPHS ABS: 1.5 10*3/uL (ref 1.0–3.6)
Lymphocytes Relative: 26 %
MCH: 31.9 pg (ref 26.0–34.0)
MCHC: 33.8 g/dL (ref 32.0–36.0)
MCV: 94.6 fL (ref 80.0–100.0)
MONO ABS: 0.7 10*3/uL (ref 0.2–1.0)
MONOS PCT: 12 %
NEUTROS PCT: 56 %
Neutro Abs: 3.2 10*3/uL (ref 1.4–6.5)
Platelets: 148 10*3/uL — ABNORMAL LOW (ref 150–440)
RBC: 3.97 MIL/uL — ABNORMAL LOW (ref 4.40–5.90)
RDW: 14.4 % (ref 11.5–14.5)
WBC: 5.6 10*3/uL (ref 3.8–10.6)

## 2015-01-19 LAB — HEPATIC FUNCTION PANEL
ALT: 27 U/L (ref 17–63)
AST: 21 U/L (ref 15–41)
Albumin: 4.8 g/dL (ref 3.5–5.0)
Alkaline Phosphatase: 96 U/L (ref 38–126)
Total Bilirubin: 0.4 mg/dL (ref 0.3–1.2)
Total Protein: 7.9 g/dL (ref 6.5–8.1)

## 2015-01-19 LAB — LACTATE DEHYDROGENASE: LDH: 164 U/L (ref 98–192)

## 2015-01-19 MED ORDER — GABAPENTIN 100 MG PO CAPS: 100.0000 mg | ORAL_CAPSULE | Freq: Every day | ORAL | Status: DC

## 2015-01-19 NOTE — Progress Notes (Signed)
Mark [name]  [phone]  Fax:(336) [place] DOB: 10/02/1945  MR#: [id number]  YTK#:[id number]  Patient Care Team: Franciso Bend, MD as PCP - General (Nephrology)  CHIEF COMPLAINT:  Chief Complaint  Patient presents with  . 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 Bone marrow biopsy on 03/05/13 with mildly hypercellular marrow for age 69-50% with interstitial predominantly small B-lymphocytic infiltrate estimated about 20-30% of marrow cells, mild nonspecific dyserythropoiesis, storage iron present this. Flow cytometry showed 29% CD5+ clonal B-cell population which is CD45+, CD5+, CD19+, CD20+, CD22+, CD23+, CD38+, HLA-DR+, Slg kappa+, and is CD10-, CD11b-, FMC7-. 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 PET scan on 03/03/13 with extensive hypermetabolic lymphadenopathy.  (presented with progressive Thrombocytopenia with persistent Anemia. CBC on 01/16/13 showed hemoglobin 10.0, MCV 98, platelets 80, WBC 4890 with 40% neutrophils, 54% lymphocytes, 1.4% monocytes. On Epogen and Venofer at dialysis treatments. Further workup showed small M-spike of 0.2).   Got Treanda/Rituxan x2 cycles in Dec 2014/Jan 2015. Then on single agent Rituxan q 8 weeks (got #3 dose on 09/16/13).  INTERVAL HISTORY:  Patient is here for continued evaluation Re: stage IV small lymphocytic lymphoma/chronic lymphocytic leukemia. Patient overall feels very well. He denies any fever , night sweats, chills. Patient remains on hemodialysis 3 times per week. Patient was also noted to have a superficial bladder cancer for which he underwent BCG therapy. Patient denies any hematuria. He reports having a CT scan for reevaluation of disease.  REVIEW OF SYSTEMS:   Review of Systems  Constitutional: Negative for fever, chills, weight loss, malaise/fatigue and diaphoresis.  HENT: Negative for congestion, ear  discharge, ear pain, hearing loss, nosebleeds, sore throat and tinnitus.   Eyes: Negative for blurred vision, double vision, photophobia, pain, discharge and redness.  Respiratory: Negative for cough, hemoptysis, sputum production, shortness of breath, wheezing and stridor.   Cardiovascular: Negative for chest pain, palpitations, orthopnea, claudication, leg swelling and PND.  Gastrointestinal: Negative for heartburn, nausea, vomiting, abdominal pain, diarrhea, constipation, blood in stool and melena.  Genitourinary: Negative.   Musculoskeletal: Negative.   Skin: Negative.   Neurological: Negative for dizziness, tingling, focal weakness, seizures, weakness and headaches.  Endo/Heme/Allergies: Does not bruise/bleed easily.  Psychiatric/Behavioral: Negative for depression. The patient is not nervous/anxious and does not have insomnia.     As per HPI. Otherwise, a complete review of systems is negatve.  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 Bone marrow biopsy on 03/05/13 with mildly hypercellular marrow for age 69-50% with interstitial predominantly small B-lymphocytic infiltrate estimated about 20-30% of marrow cells, mild nonspecific dyserythropoiesis, storage iron present this. Flow cytometry showed 29% CD5+ clonal B-cell population which is CD45+, CD5+, CD19+, CD20+, CD22+, CD23+, CD38+, HLA-DR+, Slg kappa+, and is CD10-, CD11b-, FMC7-. 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 PET scan on 03/03/13 with extensive hypermetabolic lymphadenopathy.  (presented with progressive Thrombocytopenia with persistent Anemia. CBC on 01/16/13 showed hemoglobin 10.0, MCV 98, platelets 80, WBC 4890 with 40% neutrophils, 54% lymphocytes, 1.4% monocytes. On Epogen and Venofer at dialysis treatments. Further workup showed small M-spike of 0.2).   Got Treanda/Rituxan x2 cycles in Dec 2014/Jan 2015. Then on  single agent Rituxan q 8 weeks (got #3 dose on 09/16/13).     Lymphoma ([place])   09/27/2014 Initial Diagnosis Lymphoma ([place])    PAST MEDICAL HISTORY: Past Medical History  Diagnosis Date  . Hypertension   . Diabetes mellitus without complication ([place])   . Arthritis   . Chronic kidney disease   . Lymphoma ([place]) 09/27/2014    PAST SURGICAL HISTORY: Past Surgical History  Procedure Laterality Date  . Circumcision      FAMILY HISTORY Family History  Problem Relation Age of Onset  . Kidney cancer Neg Hx   . Prostate cancer Neg Hx   . Kidney failure Neg Hx   . Bladder Cancer Neg Hx     GYNECOLOGIC HISTORY:  No LMP for male patient.     ADVANCED DIRECTIVES:    HEALTH MAINTENANCE: Social History  Substance Use Topics  . Smoking status: Former Research scientist (life sciences)  . Smokeless tobacco: Never Used     Comment: quit  . Alcohol Use: No     Colonoscopy:  PAP:  Bone density:  Lipid panel:  Allergies  Allergen Reactions  . Daypro [Oxaprozin] Swelling and Rash    Current Outpatient Prescriptions  Medication Sig Dispense Refill  . Acetaminophen 500 MG coapsule     . albuterol (PROVENTIL HFA;VENTOLIN HFA) 108 (90 BASE) MCG/ACT inhaler Inhale into the lungs.    [name] amLODipine (NORVASC) 10 MG tablet     . aspirin 325 MG tablet Take 325 mg by mouth daily.    . calcitRIOL (ROCALTROL) 0.25 MCG capsule Take by mouth.    [name] CALCIUM ACETATE, PHOS BINDER, PO Take by mouth.    . Calcium-Vitamin D (CALTRATE 600 PLUS-VIT D PO) Take by mouth.    . doxycycline (VIBRA-TABS) 100 MG tablet Take 1 tablet (100 mg total) by mouth 2 (two) times daily. 20 tablet 0  . ENSURE PLUS (ENSURE PLUS) LIQD Take by mouth.    . fluticasone (FLONASE) 50 MCG/ACT nasal spray     . furosemide (LASIX) 80 MG tablet Take 80 mg by mouth.    . gabapentin (NEURONTIN) 100 MG capsule Take 1 capsule (100 mg total) by mouth at bedtime. 90 capsule 3  . glimepiride (AMARYL) 4 MG tablet     . hydrOXYzine (ATARAX/VISTARIL) 25 MG  tablet Take by mouth.    . Isopropyl Alcohol (ALCOHOL PREP PADS EX)     . LEVEMIR FLEXTOUCH 100 UNIT/ML Pen     . Loratadine 10 MG CAPS     . lovastatin (MEVACOR) 20 MG tablet Take 20 mg by mouth at bedtime.    . metoCLOPramide (REGLAN) 5 MG tablet Take 5 mg by mouth 4 (four) times daily.    . metoprolol (LOPRESSOR) 100 MG tablet Qam day not on dialysis, Qpm day of dialysis    . multivitamin (RENA-VIT) TABS tablet Take 1 tablet by mouth daily.    [name] Neomycin-Bacitracin-Polymyxin (HCA TRIPLE ANTIBIOTIC OINTMENT EX)     . nystatin 100000 UNITS vaginal tablet Place vaginally.    . nystatin-triamcinolone (MYCOLOG II) cream Apply 1 application topically 2 (two) times daily.    . riTUXimab L8921 (RITUXAN) 10 MG/ML injection Inject into the vein.    . silver sulfADIAZINE (SILVADENE) 1 % cream Apply 1 application topically daily. 50 g 1  . ULTICARE SHORT PEN NEEDLES 31G X 8 MM MISC      No current facility-administered medications for this visit.    OBJECTIVE: BP 135/73 mmHg  Pulse 53  Temp(Src) 98.5 F (36.9 C) (Oral)  Wt 207 lb 10.8 oz (94.2 kg)   Body mass index is 28.98 kg/(m^2).    ECOG FS:1 - Symptomatic but completely ambulatory  General: Well-developed, well-nourished, no  acute distress. Eyes: Pink conjunctiva, anicteric sclera. HEENT: Normocephalic, moist mucous membranes, clear oropharnyx. Lungs: Clear to auscultation bilaterally. Heart: Regular rate and rhythm. No rubs, murmurs, or gallops. Abdomen: Soft, nontender, nondistended. No organomegaly noted, normoactive bowel sounds. Musculoskeletal: No edema, cyanosis, or clubbing. Neuro: Alert, answering all questions appropriately. Cranial nerves grossly intact. Skin: No rashes or petechiae noted. Psych: Normal affect.     LAB RESULTS:  Appointment on 01/19/2015  Component Date Value Ref Range Status  . WBC 01/19/2015 5.6  3.8 - 10.6 K/uL Final  . RBC 01/19/2015 3.97* 4.40 - 5.90 MIL/uL Final  . Hemoglobin 01/19/2015 12.7*  13.0 - 18.0 g/dL Final  . HCT 01/19/2015 37.5* 40.0 - 52.0 % Final  . MCV 01/19/2015 94.6  80.0 - 100.0 fL Final  . MCH 01/19/2015 31.9  26.0 - 34.0 pg Final  . MCHC 01/19/2015 33.8  32.0 - 36.0 g/dL Final  . RDW 01/19/2015 14.4  11.5 - 14.5 % Final  . Platelets 01/19/2015 148* 150 - 440 K/uL Final  . Neutrophils Relative % 01/19/2015 56   Final  . Neutro Abs 01/19/2015 3.2  1.4 - 6.5 K/uL Final  . Lymphocytes Relative 01/19/2015 26   Final  . Lymphs Abs 01/19/2015 1.5  1.0 - 3.6 K/uL Final  . Monocytes Relative 01/19/2015 12   Final  . Monocytes Absolute 01/19/2015 0.7  0.2 - 1.0 K/uL Final  . Eosinophils Relative 01/19/2015 5   Final  . Eosinophils Absolute 01/19/2015 0.3  0 - 0.7 K/uL Final  . Basophils Relative 01/19/2015 1   Final  . Basophils Absolute 01/19/2015 0.0  0 - 0.1 K/uL Final  . LDH 01/19/2015 164  98 - 192 U/L Final  . Total Protein 01/19/2015 7.9  6.5 - 8.1 g/dL Final  . Albumin 01/19/2015 4.8  3.5 - 5.0 g/dL Final  . AST 01/19/2015 21  15 - 41 U/L Final  . ALT 01/19/2015 27  17 - 63 U/L Final  . Alkaline Phosphatase 01/19/2015 96  38 - 126 U/L Final  . Total Bilirubin 01/19/2015 0.4  0.3 - 1.2 mg/dL Final  . Bilirubin, Direct 01/19/2015 <0.1* 0.1 - 0.5 mg/dL Final  . Indirect Bilirubin 01/19/2015 NOT CALCULATED  0.3 - 0.9 mg/dL Final    STUDIES: No results found.  ASSESSMENT:  Stage IV SLL/CLL.  PLAN:    1.  SLL/CLL. 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 Bone marrow biopsy on 03/05/13 with interstitial predominantly small B-lymphocytic infiltrate estimated about 20-30% of marrow cells. Flow cytometry showed 29% CD5+ clonal B-cell population which is CD45+, CD5+, CD19+, CD20+, CD22+, CD23+, CD38+, HLA-DR+, Slg kappa+, and is CD10-, CD11b-, FMC7-. 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 PET scan on 03/03/13 with extensive hypermetabolic lymphadenopathy.  Patient completed 2 cycles of Treanda/Rituxan on January 28, received Neulasta on January 29 but still got hospitalized with significant illness and pneumonia requiring oxygen. Given his compromised functional status, that he is on hemodialysis and has other medical problems including diabetes, he was then continued on current regimen with maintenance Rituxan given once every 8 weeks (planned for about 2 years as maintenance treatment) last Rituxan therapy in June 2015. Rituxan was discontinued due to superficial bladder cancer diagnosis and treatment with BCG.    Most recent CT scan was performed on 01/12/2015 and reported minimal enlargement of borderline enlarged low left cervical, left supraclavicular , and mediastinal lymph nodes. Findings reported as nonspecific but as  patient is asymptomatic and WBC count remained stable we will continue to monitor. Results were reviewed with Dr. Grayland Ormond and he is in agreement with scheduling a follow-up CT scan of chest abdomen and pelvis in approximately 4 months for further evaluation.  We'll schedule next follow-up appointment following  CT scan.  Patient expressed understanding and was in agreement with this plan. He also understands that He can call clinic at any time with any questions, concerns, or complaints.   Dr. Oliva Bustard was available for consultation and review of plan of care for this patient.  Lymphoma [facility])   Staging form: Lymphoid Neoplasms, AJCC 6th Edition     Clinical stage from 09/27/2014: Stage IV - Signed by Leia Alf, MD on 09/27/2014   Evlyn Kanner, NP   01/19/2015 5:06 PM

## 2015-01-23 ENCOUNTER — Encounter: Payer: Self-pay | Admitting: Podiatry

## 2015-01-23 NOTE — Progress Notes (Signed)
Patient ID: Regenia Skeeter., male   DOB: 08-14-1945, 69 y.o.   MRN: [id number]  Subjective: 69 year old male presents the office today with his daughter for followup evaluation of a wound to his left foot. He states that he is doing "so-so" he occasionally gets some numbness and tingling/burning pain to his foot for which he's been taking gabapentin. He states that his nephrologist states that he can take up to 300 mg a day. He takes between 200 and 300 mg a day. He finished his course of antibiotics. He denies any drainage or pus coming from the wound and denies any surrounding erythema or red streaks. He denies any systemic complaints as fevers, chills, nausea, vomiting. No calf pain, chest pain, shortness of breath.  Objective: AAO 3, NAD DP/PT pulses palpable, CRT less than 3 seconds Neurological status decreased Left foot has an ulceration sub-metatarsal 5 with a hyperkeratotic periwound. Upon debridement ulceration measures 0.9 x 0.8 cm.  There is no probe to bone, undermining, tunneling. There is no surrounding erythema, ascending cellulitis, fluctuance, crepitus, malodor, drainage/purulence. The wound base is granular after debridement. No other open lesions or pre-ulcerative lesions. There is no pain with calf compression, swelling, warmth, erythema. Nails are hypertrophic, dystrophic, brittle, discolored, elongated 10. There is no surrounding erythema or drainage. There is tenderness to palpation to nails 1-5 bilaterally.  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  Plan:  -Treatment options discussed including all alternatives, risks, and complications -Wound was sharply debrided without complications. The wound was debrided to healthy, bleeding, granular tissue. Iodosorb was applied followed by dry sterile dressing. Continue daily dressing changes. Continue with silvadene dressings for now. I will order  collagen with silver from [facility]. Monitor for any clinical signs or symptoms of infection and directed to call the office immediately should any occur or go to the ER. Offloading pads. -Follow-up in 3 weeks or sooner if any problems arise. In the meantime, encouraged to call the office with any questions, concerns, change in symptoms.   Celesta Gentile, DPM

## 2015-02-04 ENCOUNTER — Ambulatory Visit (INDEPENDENT_AMBULATORY_CARE_PROVIDER_SITE_OTHER): Payer: Medicare HMO | Admitting: Podiatry

## 2015-02-04 ENCOUNTER — Encounter: Payer: Self-pay | Admitting: Podiatry

## 2015-02-04 VITALS — BP 150/50 | HR 66 | Resp 18

## 2015-02-04 DIAGNOSIS — L89891 Pressure ulcer of other site, stage 1: Secondary | ICD-10-CM | POA: Diagnosis not present

## 2015-02-04 DIAGNOSIS — L97521 Non-pressure chronic ulcer of other part of left foot limited to breakdown of skin: Secondary | ICD-10-CM

## 2015-02-04 MED ORDER — HYDROCODONE-ACETAMINOPHEN 5-325 MG PO TABS: 1.0000 | ORAL_TABLET | Freq: Four times a day (QID) | ORAL | Status: DC | PRN

## 2015-02-04 MED ORDER — GABAPENTIN 100 MG PO CAPS: 100.0000 mg | ORAL_CAPSULE | Freq: Three times a day (TID) | ORAL | Status: DC

## 2015-02-04 NOTE — Progress Notes (Signed)
Patient ID: Regenia Skeeter., male   DOB: [birth year], 69 y.o.   MRN: [id number]  Subjective: 69 year old male presents the office today with his daughter for followup evaluation of a wound to his left foot. He states he is doing well on the patient's daughter believe that the areas healing. He has started the collagen and  Silver dressings. There are asking for refill of the gabapentin. He denies any Sunday redness or red streaks. Denies any posture drainage coming from the wound. No other complaints at this time. He denies any systemic complaints as fevers, chills, nausea, vomiting. No calf pain, chest pain, shortness of breath.  Objective: AAO 3, NAD DP/PT pulses palpable, CRT less than 3 seconds Neurological status decreased Left foot has an ulceration sub-metatarsal 5 with a hyperkeratotic periwound. Upon debridement ulceration measures 0.8 x 0.5 cm and is superficial with a granular wound base. There is no probe to bone, undermining, tunneling. There is no surrounding erythema, ascending cellulitis, fluctuance, crepitus, malodor, drainage/purulence.  No other open lesions.  There is no pain with calf compression, swelling, warmth, erythema..  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  Plan:  -Treatment options discussed including all alternatives, risks, and complications -Wound was sharply debrided without complications. The wound was debrided to healthy, bleeding, granular tissue. Iodosorb was applied followed by dry sterile dressing. Continue daily dressing changes.   - He gets intermittent pain to the wound. Prescribed Vicodin. He has had this previously without any problems.Continue with collagen/silver dressing. I also  Border dressing supplies today. -Refilled gabapentin. Max 300 mg daily per his  Nephrologist. - Monitor for any clinical signs or symptoms of infection and directed to call the office  immediately should any occur or go to the ER. Offloading pads. -Follow-up in 3 weeks or sooner if any problems arise. In the meantime, encouraged to call the office with any questions, concerns, change in symptoms.   Celesta Gentile, DPM

## 2015-02-23 ENCOUNTER — Ambulatory Visit (INDEPENDENT_AMBULATORY_CARE_PROVIDER_SITE_OTHER): Payer: Medicare HMO | Admitting: Podiatry

## 2015-02-23 ENCOUNTER — Encounter: Payer: Self-pay | Admitting: Podiatry

## 2015-02-23 VITALS — BP 152/75 | HR 57 | Resp 18

## 2015-02-23 DIAGNOSIS — L89891 Pressure ulcer of other site, stage 1: Secondary | ICD-10-CM

## 2015-02-23 DIAGNOSIS — L97521 Non-pressure chronic ulcer of other part of left foot limited to breakdown of skin: Secondary | ICD-10-CM

## 2015-03-01 ENCOUNTER — Encounter: Payer: Self-pay | Admitting: Podiatry

## 2015-03-01 NOTE — Progress Notes (Signed)
Patient ID: Mark [name]., male   DOB: 1945-10-10, 69 y.o.   MRN: [id number]  Subjective: 69 year old male presents the office today with his daughter for followup evaluation of a wound to his left foot. He states he is continues to do well and he believes the wound is healing. He has continued with the collagen and  Silver dressings. His pain is much improved.He denies any surrounding redness or red streaks. Denies any pus or drainage coming from the wound. No other complaints at this time. He denies any systemic complaints as fevers, chills, nausea, vomiting. No calf pain, chest pain, shortness of breath.  Objective: AAO 3, NAD DP/PT pulses palpable, CRT less than 3 seconds Neurological status decreased Left foot has an ulceration sub-metatarsal 5 with a hyperkeratotic periwound. Upon debridement ulceration measures 0.5 x 0.3 cm and is superficial with a granular wound base. There is no probe to bone, undermining, tunneling. There is no surrounding erythema, ascending cellulitis, fluctuance, crepitus, malodor, drainage/purulence.  No other open lesions.  There is no pain with calf compression, swelling, warmth, erythema..  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  Plan:  -Treatment options discussed including all alternatives, risks, and complications -Wound was sharply debrided without complications. The wound was debrided to healthy, bleeding, granular tissue. Iodosorb was applied followed by dry sterile dressing. Continue daily dressing changes. Continue offloading pads.  -Gabapentin as needed - Monitor for any clinical signs or symptoms of infection and directed to call the office immediately should any occur or go to the ER. Offloading pads. -Follow-up in 3 weeks or sooner if any problems arise. In the meantime, encouraged to call the office with any questions, concerns, change in symptoms.   Celesta Gentile, DPM

## 2015-03-18 ENCOUNTER — Ambulatory Visit (INDEPENDENT_AMBULATORY_CARE_PROVIDER_SITE_OTHER): Payer: Medicare HMO | Admitting: Podiatry

## 2015-03-18 ENCOUNTER — Encounter: Payer: Self-pay | Admitting: Podiatry

## 2015-03-18 VITALS — BP 163/70 | HR 72 | Resp 18

## 2015-03-18 DIAGNOSIS — L89891 Pressure ulcer of other site, stage 1: Secondary | ICD-10-CM

## 2015-03-18 DIAGNOSIS — L97521 Non-pressure chronic ulcer of other part of left foot limited to breakdown of skin: Secondary | ICD-10-CM

## 2015-03-21 ENCOUNTER — Encounter: Payer: Self-pay | Admitting: Podiatry

## 2015-03-21 DIAGNOSIS — L97509 Non-pressure chronic ulcer of other part of unspecified foot with unspecified severity: Secondary | ICD-10-CM | POA: Insufficient documentation

## 2015-03-21 NOTE — Progress Notes (Signed)
Patient ID: Mark Skeeter., male   DOB: Jun 04, 1945, 69 y.o.   MRN: [id number]  Subjective: 69 year old male presents the office today  for followup evaluation of a wound to his left foot.  He definitive the last couple days he has had some smell to the area. He denies any pus, from the wound but does state that he does get some clear bloody drainage at times on the bandage. He has tried to stay off as much as possible but he does continue to walk in a regular shoe.  He has been continuous Silver dressing changes daily.No other complaints at this time. He denies any systemic complaints as fevers, chills, nausea, vomiting. No calf pain, chest pain, shortness of breath.  Objective: AAO 3, NAD DP/PT pulses palpable, CRT less than 3 seconds Neurological status decreased Left foot has an ulceration sub-metatarsal 5 with a hyperkeratotic periwound. Upon debridement ulceration measures 0.6 x 0.5 x 0.2 cm  Which is increased somewhat compared to last appointment. There is also more hyperkeratotic tissue on the periwound. There was some serosanguineous drainage expressed and there is no pus. There is no probe to bone, undermining, tunneling. There is no surrounding erythema, ascending cellulitis, fluctuance, crepitus, malodor.   No edema to the foot. No other open lesions.  There is no pain with calf compression, swelling, warmth, erythema..  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 The wound was debrided to healthy, bleeding, granular tissue. Iodosorb was applied followed by dry sterile dressing. Continue daily dressing changes with silver dressing.  -surgical shoe was dispensed today and offloading pads were made to help take more pressure off the wound. -given the drainage as well as the history malodor. We'll restart  antibiotics in case of early infection. Prescribed doxycycline. -Gabapentin as needed,  He has not had much pain recently. - Monitor for any clinical signs or symptoms of infection and directed to call the office immediately should any occur or go to the ER. Offloading pads. -Follow-up in 2 weeks or sooner if any problems arise. In the meantime, encouraged to call the office with any questions, concerns, change in symptoms.   Celesta Gentile, DPM

## 2015-03-25 ENCOUNTER — Telehealth: Payer: Self-pay | Admitting: *Deleted

## 2015-03-29 NOTE — Telephone Encounter (Signed)
Entered in error

## 2015-04-01 ENCOUNTER — Ambulatory Visit (INDEPENDENT_AMBULATORY_CARE_PROVIDER_SITE_OTHER): Payer: Medicare HMO | Admitting: Podiatry

## 2015-04-01 ENCOUNTER — Encounter: Payer: Self-pay | Admitting: Podiatry

## 2015-04-01 VITALS — BP 151/65 | HR 65 | Resp 18

## 2015-04-01 DIAGNOSIS — E1149 Type 2 diabetes mellitus with other diabetic neurological complication: Secondary | ICD-10-CM

## 2015-04-01 DIAGNOSIS — L97521 Non-pressure chronic ulcer of other part of left foot limited to breakdown of skin: Secondary | ICD-10-CM

## 2015-04-01 DIAGNOSIS — B351 Tinea unguium: Secondary | ICD-10-CM

## 2015-04-01 DIAGNOSIS — L89891 Pressure ulcer of other site, stage 1: Secondary | ICD-10-CM | POA: Diagnosis not present

## 2015-04-01 DIAGNOSIS — M79676 Pain in unspecified toe(s): Secondary | ICD-10-CM

## 2015-04-05 ENCOUNTER — Encounter: Payer: Self-pay | Admitting: Podiatry

## 2015-04-05 NOTE — Progress Notes (Signed)
Patient ID: Mark [name]., male   DOB: 1945-06-15, 70 y.o.   MRN: [id number]  Subjective: 70 year old male presents the office today  for followup evaluation of a wound to his left foot. He states he believes the area is healing nicely there is bit more healing compared to previous appointment. Denies any redness or drainage. He is having no pain to the area. He has a states his nails are elongated causing discomfort and irritation shoe gear and he asked them to be trimmed as he cannot himself. He does continue with collagen silver dressing changes daily to the wound. No other complaints at this time. He denies any systemic complaints as fevers, chills, nausea, vomiting. No calf pain, chest pain, shortness of breath.  Objective: AAO 3, NAD DP/PT pulses palpable, CRT less than 3 seconds Neurological status decreased Left foot has an ulceration sub-metatarsal 5 with a hyperkeratotic periwound. Upon debridement ulceration measures 0.5 x 0.2 x 0.1 cm. Debridement almost has more hyperkeratotic tissue overlying the wound does appear to have improved compared to last appointment. There is no drainage or purulence. No swelling erythema, edema, ascending cellulitis, fluctuance, crepitus, malodor. No other open lesions or pre-ulcerative lesions identified bilaterally. Nails are hypertrophic, dystrophic, brittle, discolored, elongated 10. No surrounding erythema or drainage. Tenderness to nails 1-5 bilaterally.  There is no pain with calf compression, swelling, warmth, erythema..  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 The wound was debrided to healthy, bleeding, granular tissue. Iodosorb was applied followed by dry sterile dressing. Continue daily dressing changes with silver dressing.   -Continue a surgical shoe and offloading pads. -Hold off on further antibiotics at this point. -Gabapentin for neuropathy  -Nails are braided 10 without complication/bleeding - Monitor for any clinical signs or symptoms of infection and directed to call the office immediately should any occur or go to the ER. Offloading pads. -Follow-up in 3 weeks or sooner if any problems arise. In the meantime, encouraged to call the office with any questions, concerns, change in symptoms.   Celesta Gentile, DPM

## 2015-04-22 ENCOUNTER — Ambulatory Visit (INDEPENDENT_AMBULATORY_CARE_PROVIDER_SITE_OTHER): Payer: Medicare HMO | Admitting: Podiatry

## 2015-04-22 ENCOUNTER — Encounter: Payer: Self-pay | Admitting: Podiatry

## 2015-04-22 VITALS — BP 146/67 | HR 61 | Resp 18

## 2015-04-22 DIAGNOSIS — L97521 Non-pressure chronic ulcer of other part of left foot limited to breakdown of skin: Secondary | ICD-10-CM | POA: Diagnosis not present

## 2015-04-22 DIAGNOSIS — L84 Corns and callosities: Secondary | ICD-10-CM

## 2015-04-22 NOTE — Progress Notes (Signed)
Patient ID: Mark [name]., male   DOB: [birth year], 70 y.o.   MRN: [id number]  Subjective: 70 year old male presents the office today  for followup evaluation of a wound to his left foot.he does continue to pad the area daily. He has not noticed any drainage coming from the area and he feels that the wound is healing well. Denies any pain to the wound since last appointment.no other complaints at this time. He denies any systemic complaints as fevers, chills, nausea, vomiting. No calf pain, chest pain, shortness of breath.  Objective: AAO 3, NAD DP/PT pulses palpable, CRT less than 3 seconds Neurological status decreased Left foot has an ulceration sub-metatarsal 5 with a hyperkeratotic periwound. Upon debridementthere is currently no underlying ulceration of the wound appears to be healed. There is no edema, erythema, increase in warmth. No other open lesions or pre-ulcerative lesions are identified bilaterally. There is no pain with calf compression, swelling, warmth, erythema..  Assessment:  Healed ulceration left submetatarsal 5  Plan:  -Treatment options discussed including all alternatives, risks, and complications -hyperkeratotic lesionwas sharply debrided without complications. No underlying ulceration was present at today's appointment. Recommended continue offloading for now. Awaiting diabetic shoe approval. I will have him back in 2 weeks to see [facility] for measurments.  - Monitor for any clinical signs or symptoms of infection and directed to call the office immediately should any occur or go to the ER. Offloading pads. -Follow-up in 4 weeks or sooner if any problems arise. In the meantime, encouraged to call the office with any questions, concerns, change in symptoms.   Celesta Gentile, DPM

## 2015-05-05 ENCOUNTER — Ambulatory Visit (INDEPENDENT_AMBULATORY_CARE_PROVIDER_SITE_OTHER): Payer: Medicare HMO | Admitting: *Deleted

## 2015-05-05 DIAGNOSIS — E1149 Type 2 diabetes mellitus with other diabetic neurological complication: Secondary | ICD-10-CM

## 2015-05-05 DIAGNOSIS — L97521 Non-pressure chronic ulcer of other part of left foot limited to breakdown of skin: Secondary | ICD-10-CM

## 2015-05-07 NOTE — Progress Notes (Signed)
Measured for diabetic shoes and insoles. Patients PCP has not signed off on documentation. Will hold measurements until received.

## 2015-05-13 ENCOUNTER — Ambulatory Visit
Admission: RE | Admit: 2015-05-13 | Discharge: 2015-05-13 | Disposition: A | Discharge status: Home / Self Care | Payer: Medicare HMO | Source: Ambulatory Visit | Attending: Family Medicine | Admitting: Family Medicine

## 2015-05-13 DIAGNOSIS — I700 Atherosclerosis of aorta: Secondary | ICD-10-CM | POA: Diagnosis not present

## 2015-05-13 DIAGNOSIS — R918 Other nonspecific abnormal finding of lung field: Secondary | ICD-10-CM | POA: Insufficient documentation

## 2015-05-13 DIAGNOSIS — C9510 Chronic leukemia of unspecified cell type not having achieved remission: Secondary | ICD-10-CM | POA: Insufficient documentation

## 2015-05-19 ENCOUNTER — Encounter: Payer: Self-pay | Admitting: *Deleted

## 2015-05-20 ENCOUNTER — Encounter: Payer: Self-pay | Admitting: Internal Medicine

## 2015-05-20 ENCOUNTER — Inpatient Hospital Stay: Payer: Medicare HMO | Attending: Internal Medicine | Admitting: Internal Medicine

## 2015-05-20 ENCOUNTER — Inpatient Hospital Stay: Payer: Medicare HMO

## 2015-05-20 VITALS — BP 165/72 | HR 66 | Temp 96.0°F | Resp 22 | Wt 211.0 lb

## 2015-05-20 DIAGNOSIS — Z992 Dependence on renal dialysis: Secondary | ICD-10-CM | POA: Diagnosis not present

## 2015-05-20 DIAGNOSIS — Z8551 Personal history of malignant neoplasm of bladder: Secondary | ICD-10-CM

## 2015-05-20 DIAGNOSIS — C679 Malignant neoplasm of bladder, unspecified: Secondary | ICD-10-CM | POA: Diagnosis not present

## 2015-05-20 DIAGNOSIS — C9110 Chronic lymphocytic leukemia of B-cell type not having achieved remission: Secondary | ICD-10-CM

## 2015-05-20 DIAGNOSIS — Z7982 Long term (current) use of aspirin: Secondary | ICD-10-CM | POA: Insufficient documentation

## 2015-05-20 DIAGNOSIS — I129 Hypertensive chronic kidney disease with stage 1 through stage 4 chronic kidney disease, or unspecified chronic kidney disease: Secondary | ICD-10-CM | POA: Insufficient documentation

## 2015-05-20 DIAGNOSIS — M1990 Unspecified osteoarthritis, unspecified site: Secondary | ICD-10-CM | POA: Diagnosis not present

## 2015-05-20 DIAGNOSIS — N186 End stage renal disease: Secondary | ICD-10-CM | POA: Diagnosis not present

## 2015-05-20 DIAGNOSIS — Z79899 Other long term (current) drug therapy: Secondary | ICD-10-CM

## 2015-05-20 DIAGNOSIS — R918 Other nonspecific abnormal finding of lung field: Secondary | ICD-10-CM

## 2015-05-20 DIAGNOSIS — Z87891 Personal history of nicotine dependence: Secondary | ICD-10-CM | POA: Diagnosis not present

## 2015-05-20 DIAGNOSIS — N189 Chronic kidney disease, unspecified: Secondary | ICD-10-CM | POA: Insufficient documentation

## 2015-05-20 DIAGNOSIS — M542 Cervicalgia: Secondary | ICD-10-CM | POA: Diagnosis not present

## 2015-05-20 LAB — CBC WITH DIFFERENTIAL/PLATELET
Basophils Absolute: 0.1 10*3/uL (ref 0–0.1)
Basophils Relative: 1 %
EOS PCT: 4 %
Eosinophils Absolute: 0.2 10*3/uL (ref 0–0.7)
HEMATOCRIT: 37.1 % — AB (ref 40.0–52.0)
HEMOGLOBIN: 13.1 g/dL (ref 13.0–18.0)
LYMPHS PCT: 33 %
Lymphs Abs: 1.8 10*3/uL (ref 1.0–3.6)
MCH: 32.9 pg (ref 26.0–34.0)
MCHC: 35.4 g/dL (ref 32.0–36.0)
MCV: 93.1 fL (ref 80.0–100.0)
MONO ABS: 0.4 10*3/uL (ref 0.2–1.0)
MONOS PCT: 8 %
NEUTROS ABS: 3.1 10*3/uL (ref 1.4–6.5)
Neutrophils Relative %: 54 %
Platelets: 148 10*3/uL — ABNORMAL LOW (ref 150–440)
RBC: 3.99 MIL/uL — ABNORMAL LOW (ref 4.40–5.90)
RDW: 13 % (ref 11.5–14.5)
WBC: 5.6 10*3/uL (ref 3.8–10.6)

## 2015-05-20 LAB — COMPREHENSIVE METABOLIC PANEL
ALBUMIN: 4.8 g/dL (ref 3.5–5.0)
ALK PHOS: 83 U/L (ref 38–126)
ALT: 21 U/L (ref 17–63)
ANION GAP: 11 (ref 5–15)
AST: 19 U/L (ref 15–41)
BUN: 36 mg/dL — ABNORMAL HIGH (ref 6–20)
CALCIUM: 9.1 mg/dL (ref 8.9–10.3)
CO2: 28 mmol/L (ref 22–32)
Chloride: 95 mmol/L — ABNORMAL LOW (ref 101–111)
Creatinine, Ser: 5.26 mg/dL — ABNORMAL HIGH (ref 0.61–1.24)
GFR calc Af Amer: 12 mL/min — ABNORMAL LOW (ref >60)
GFR calc non Af Amer: 10 mL/min — ABNORMAL LOW (ref >60)
GLUCOSE: 155 mg/dL — AB (ref 65–99)
Potassium: 4 mmol/L (ref 3.5–5.1)
SODIUM: 134 mmol/L — AB (ref 135–145)
Total Bilirubin: 0.6 mg/dL (ref 0.3–1.2)
Total Protein: 7.4 g/dL (ref 6.5–8.1)

## 2015-05-20 LAB — LACTATE DEHYDROGENASE: LDH: 154 U/L (ref 98–192)

## 2015-05-20 NOTE — Progress Notes (Signed)
Patient here for follow up complaining of stiff neck at time seems to be dependent on sleep position.

## 2015-05-20 NOTE — Progress Notes (Signed)
Mark [name] OFFICE PROGRESS NOTE  Patient Care Team: Franciso Bend, MD as PCP - General (Nephrology)   SUMMARY OF ONCOLOGIC HISTORY:  # DEC 2014- SMALL CELL LYMPHOMA/CLL- STAGE IV [BMBx- 03/2013- 20-30% B cell infiltrate; CD 5+/CD 20+]; FISH- Trisomy 12;DEC 2014-Treanda-Rituxan x2 [held sec to prolonged hospital/pneumoani/HD]; Rituxa q8W [last June 2015; Held sec to Superficial Bladder ca]; CT- FEB 2017- STABLE Mediastinal LN/supraclav LN;   # ESRD on HD [Dr.Voora]; DM; Non-healing ulcers in feet/Hx R foot drop- walker ambulation  INTERVAL HISTORY:  This is my first interaction with the patient since I joined the practice September 2016. I reviewed the patient's prior charts/pertinent labs/imaging in detail; findings are summarized above.   A very pleasant 70 year old African-American [name] patient with above history of SLL/CLL currently on surveillance is here for follow-up. Patient denies any unusual weight loss or night sweats or new lumps or bumps. He has mild right neck pain which is not getting any worse.  Given history of diabetes- patient has history of ulcers of his left foot/ is currently in a bandage. 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   REVIEW OF SYSTEMS:  A complete 10 point review of system is done which is negative except mentioned above/history of present illness.   PAST MEDICAL HISTORY :  Past Medical History  Diagnosis Date  . Hypertension   . Diabetes mellitus without complication ([place])   . Arthritis   . Chronic kidney disease   . Lymphoma ([place]) 09/27/2014  . Bladder cancer ([place])     PAST SURGICAL HISTORY :   Past Surgical History  Procedure Laterality Date  . Circumcision    . Back surgery  2007    FAMILY HISTORY :   Family History  Problem Relation Age of Onset  . Kidney cancer Neg Hx   . Prostate cancer Neg Hx   . Kidney failure Neg Hx   . Bladder Cancer Neg Hx     SOCIAL HISTORY:   Social History  Substance Use Topics  .  Smoking status: Former Smoker    Types: Cigarettes    Quit date: 05/19/2013  . Smokeless tobacco: Never Used     Comment: quit  . Alcohol Use: No    ALLERGIES:  is allergic to daypro.  MEDICATIONS:  Current Outpatient Prescriptions  Medication Sig Dispense Refill  . Acetaminophen 500 MG coapsule     . albuterol (PROVENTIL HFA;VENTOLIN HFA) 108 (90 BASE) MCG/ACT inhaler Inhale into the lungs.    [name] amLODipine (NORVASC) 10 MG tablet     . aspirin 325 MG tablet Take 325 mg by mouth daily.    . calcitRIOL (ROCALTROL) 0.25 MCG capsule Take by mouth.    [name] CALCIUM ACETATE, PHOS BINDER, PO Take by mouth.    . Calcium-Vitamin D (CALTRATE 600 PLUS-VIT D PO) Take by mouth.    . ENSURE PLUS (ENSURE PLUS) LIQD Take by mouth.    . fluticasone (FLONASE) 50 MCG/ACT nasal spray     . furosemide (LASIX) 80 MG tablet Take 80 mg by mouth.    . gabapentin (NEURONTIN) 100 MG capsule Take 1 capsule (100 mg total) by mouth at bedtime. 90 capsule 3  . gabapentin (NEURONTIN) 100 MG capsule Take 1 capsule (100 mg total) by mouth 3 (three) times daily. 90 capsule 3  . glimepiride (AMARYL) 4 MG tablet     . Isopropyl Alcohol (ALCOHOL PREP PADS EX)     . LEVEMIR FLEXTOUCH 100 UNIT/ML Pen     .  Loratadine 10 MG CAPS     . lovastatin (MEVACOR) 20 MG tablet Take 20 mg by mouth at bedtime.    . metoCLOPramide (REGLAN) 5 MG tablet Take 5 mg by mouth 4 (four) times daily.    . metoprolol (LOPRESSOR) 100 MG tablet Qam day not on dialysis, Qpm day of dialysis    . multivitamin (RENA-VIT) TABS tablet Take 1 tablet by mouth daily.    [name] Neomycin-Bacitracin-Polymyxin (HCA TRIPLE ANTIBIOTIC OINTMENT EX)     . nystatin 100000 UNITS vaginal tablet Place vaginally.    . riTUXimab [id number] (RITUXAN) 10 MG/ML injection Inject into the vein.    . silver sulfADIAZINE (SILVADENE) 1 % cream Apply 1 application topically daily. 50 g 1  . ULTICARE SHORT PEN NEEDLES 31G X 8 MM MISC     . nystatin-triamcinolone (MYCOLOG II) cream Apply 1  application topically 2 (two) times daily.     No current facility-administered medications for this visit.    PHYSICAL EXAMINATION: ECOG PERFORMANCE STATUS: 3  BP 165/72 mmHg  Pulse 66  Temp(Src) 96 F (35.6 C) (Tympanic)  Resp 22  Wt 210 lb 15.7 oz (95.7 kg)  Filed Weights   05/20/15 1117  Weight: 210 lb 15.7 oz (95.7 kg)    GENERAL: Well-nourished well-developed; Alert, no distress and comfortable.  Accompanied by daughter/son; pt is ina wheel chair. EYES: no pallor or icterus OROPHARYNX: no thrush or ulceration NECK: supple, no masses felt LYMPH:  no palpable lymphadenopathy in the cervical, axillary or inguinal regions LUNGS: clear to auscultation and  No wheeze or crackles HEART/CVS: regular rate & rhythm and no murmurs; No lower extremity edema; L LE- bandaged; R-LE in brace.  ABDOMEN:abdomen soft, non-tender and normal bowel sounds Musculoskeletal:no cyanosis of digits and no clubbing  PSYCH: alert & oriented x 3 with fluent speech NEURO: no focal motor/sensory deficits SKIN:  no rashes or significant lesions  LABORATORY DATA:  I have reviewed the data as listed    Component Value Date/Time   NA 140 11/18/2013 1030   K 4.0 11/18/2013 1030   CL 97* 11/18/2013 1030   CO2 33* 11/18/2013 1030   GLUCOSE 265* 11/18/2013 1030   BUN 30* 11/18/2013 1030   CREATININE 5.05* 11/18/2013 1030   CALCIUM 8.6 11/18/2013 1030   PROT 7.9 01/19/2015 1130   PROT 7.0 11/18/2013 1030   ALBUMIN 4.8 01/19/2015 1130   ALBUMIN 3.7 11/18/2013 1030   AST 21 01/19/2015 1130   AST 15 11/18/2013 1030   ALT 27 01/19/2015 1130   ALT 25 11/18/2013 1030   ALKPHOS 96 01/19/2015 1130   ALKPHOS 105 11/18/2013 1030   BILITOT 0.4 01/19/2015 1130   BILITOT 0.3 11/18/2013 1030   GFRNONAA 11* 11/18/2013 1030   GFRAA 13* 11/18/2013 1030    No results found for: SPEP, UPEP  Lab Results  Component Value Date   WBC 5.6 01/19/2015   NEUTROABS 3.2 01/19/2015   HGB 12.7* 01/19/2015   HCT  37.5* 01/19/2015   MCV 94.6 01/19/2015   PLT 148* 01/19/2015      Chemistry      Component Value Date/Time   NA 140 11/18/2013 1030   K 4.0 11/18/2013 1030   CL 97* 11/18/2013 1030   CO2 33* 11/18/2013 1030   BUN 30* 11/18/2013 1030   CREATININE 5.05* 11/18/2013 1030      Component Value Date/Time   CALCIUM 8.6 11/18/2013 1030   ALKPHOS 96 01/19/2015 1130   ALKPHOS 105 11/18/2013 1030  AST 21 01/19/2015 1130   AST 15 11/18/2013 1030   ALT 27 01/19/2015 1130   ALT 25 11/18/2013 1030   BILITOT 0.4 01/19/2015 1130   BILITOT 0.3 11/18/2013 1030     On: 05/13/2015 11:53  IMPRESSION: 1. No significant change and adenopathy within the chest. 2. Right iliac nodes are slightly increased in size from previous exam. 3. Aortic atherosclerosis. 4. New ground-glass attenuating nodule is identified within the right middle lobe. Initial follow-up by chest CT without contrast is recommended in 3 months to confirm persistence.   ASSESSMENT & PLAN:   # SLL/CLL- stage IV- currently on surveillance given multiple comorbidities. Reviewed the most recent CT scan in February 2017- no evidence of any gross progression; one of the right pelvic lymph node has slightly gone up from 1 cm to 1.3 cm. I reviewed the images myself reviewed with the patient and family. Patient is asymptomatic from his CLL/SLL at this time. I would recommend continued surveillance every 4 months or so. Recommend CBC CMP and LDH today; again in follow-up.  # right middle lobe 7 mm groundglass opacity- recommend follow-up CT scan in 4 months.  # superficial bladder cancer/ plan for repeat cystoscopy this week.  # 25 minutes face-to-face with the patient discussing the above plan of care; more than 50% of time spent on prognosis/ natural history; counseling and coordination.     Orders Placed This Encounter  Procedures  . CT Abdomen Pelvis Wo Contrast    Standing Status: Future     Number of Occurrences:       Standing Expiration Date: 08/18/2016    Order Specific Question:  Reason for Exam (SYMPTOM  OR DIAGNOSIS REQUIRED)    Answer:  SLL/CLL    Order Specific Question:  Preferred imaging location?    Answer:  [facility]  . CT Chest Wo Contrast    Standing Status: Future     Number of Occurrences:      Standing Expiration Date: 07/19/2016    Order Specific Question:  Reason for Exam (SYMPTOM  OR DIAGNOSIS REQUIRED)    Answer:  lymphoma/CLL    Order Specific Question:  Preferred imaging location?    Answer:  [facility]  . CBC with Differential    Standing Status: Future     Number of Occurrences:      Standing Expiration Date: 05/19/2016  . Lactate dehydrogenase    Standing Status: Future     Number of Occurrences:      Standing Expiration Date: 05/19/2016  . Comprehensive metabolic panel    Standing Status: Future     Number of Occurrences:      Standing Expiration Date: 05/19/2016    Order Specific Question:  Has the patient fasted?    Answer:  No       Cammie Sickle, MD 05/20/2015 11:42 AM

## 2015-05-27 ENCOUNTER — Ambulatory Visit (INDEPENDENT_AMBULATORY_CARE_PROVIDER_SITE_OTHER): Payer: Medicare HMO | Admitting: Podiatry

## 2015-05-27 ENCOUNTER — Encounter: Payer: Self-pay | Admitting: Podiatry

## 2015-05-27 VITALS — BP 178/88 | HR 65 | Resp 18

## 2015-05-27 DIAGNOSIS — E1149 Type 2 diabetes mellitus with other diabetic neurological complication: Secondary | ICD-10-CM | POA: Diagnosis not present

## 2015-05-27 DIAGNOSIS — L84 Corns and callosities: Secondary | ICD-10-CM

## 2015-05-27 DIAGNOSIS — B351 Tinea unguium: Secondary | ICD-10-CM | POA: Diagnosis not present

## 2015-05-27 DIAGNOSIS — M79676 Pain in unspecified toe(s): Secondary | ICD-10-CM | POA: Diagnosis not present

## 2015-05-28 ENCOUNTER — Encounter: Payer: Self-pay | Admitting: Podiatry

## 2015-05-28 NOTE — Progress Notes (Signed)
Patient ID: Mark [name]., male   DOB: 07/06/1945, 70 y.o.   MRN: [id number]  Subjective: 70 year old male presents the office today  for followup evaluation of a wound to his left foot. 2 that he feels that the area continues to be healed. He is understanding drainage or pus or any swelling or redness. He is having no pain. His nails are also thick, discolored and elongated and cannot trim himself nerve causing irritation in shoes. He discontinued the surgical shoe in the left side and he is awaiting diabetic shoes. He denies any systemic complaints as fevers, chills, nausea, vomiting. No calf pain, chest pain, shortness of breath.  Objective: AAO 3, NAD DP/PT pulses palpable, CRT less than 3 seconds Neurological status decreased Left foot submetatarsal 5 hyperkeratotic lesion. Upon debridement there is no underlying ulceration, drainage or other signs of infection. There is no swelling erythema, ascending synovitis, fluctuance, crepitus [place] Kidney appears to be healed without any signs of infection. There is no swelling to the feet. The nails are hypertrophic, dystrophic, brittle, discolored, elongated 10. No swelling erythema drainage has tenderness along the nails 10. No other open lesions or pre-ulcer lesions identified bilaterally.  There is no pain with calf compression, swelling, warmth, erythema..  Assessment:  Healed ulceration left submetatarsal 5; symptomatic onychomycosis    Plan:  -Treatment options discussed including all alternatives, risks, and complications -Hyperkeratotic lesion debrided 1 without competitions of bleeding., No signs of ulceration. Continue surgical shoe with offloading pad until we get diabetic shoe. -Nails 10 debris without complications or bleeding - Monitor for any clinical signs or symptoms of infection and directed to call the office immediately should any occur or go to the ER. Offloading pads. -Follow-up to PUDS or sooner if any problems arise. In  the meantime, encouraged to call the office with any questions, concerns, change in symptoms.   Celesta Gentile, DPM

## 2015-07-06 ENCOUNTER — Ambulatory Visit (INDEPENDENT_AMBULATORY_CARE_PROVIDER_SITE_OTHER): Payer: Medicare HMO | Admitting: Urology

## 2015-07-06 ENCOUNTER — Encounter: Payer: Self-pay | Admitting: Urology

## 2015-07-06 VITALS — BP 165/78 | HR 57 | Ht 70.5 in | Wt 211.8 lb

## 2015-07-06 DIAGNOSIS — Z8551 Personal history of malignant neoplasm of bladder: Secondary | ICD-10-CM | POA: Diagnosis not present

## 2015-07-06 DIAGNOSIS — Z992 Dependence on renal dialysis: Secondary | ICD-10-CM | POA: Diagnosis not present

## 2015-07-06 DIAGNOSIS — N186 End stage renal disease: Secondary | ICD-10-CM | POA: Diagnosis not present

## 2015-07-06 DIAGNOSIS — Q6100 Congenital renal cyst, unspecified: Secondary | ICD-10-CM | POA: Diagnosis not present

## 2015-07-06 DIAGNOSIS — N281 Cyst of kidney, acquired: Secondary | ICD-10-CM

## 2015-07-06 MED ORDER — CIPROFLOXACIN HCL 500 MG PO TABS
500.0000 mg | ORAL_TABLET | Freq: Once | ORAL | Status: AC
  Administered 2015-07-06: 500 mg via ORAL

## 2015-07-06 MED ORDER — LIDOCAINE HCL 2 % EX GEL
1.0000 "application " | Freq: Once | CUTANEOUS | Status: AC
  Administered 2015-07-06: 1 via URETHRAL

## 2015-07-06 NOTE — Progress Notes (Signed)
07/06/2015 2:40 PM   Mark J Dress Jr. Aug 19, 1945 [id number]  Referring provider: Franciso Bend, MD [address] CB# [id number] [place], [place] [address]  Chief Complaint  Patient presents with  . Cysto    bladder cancer    HPI:  1 - Bladder Cancer -  2015 - high-grade superficial bladder cancer s/p TURBT and induction BCG, compliant with cysto surveillance:  Recent sumarized course: 01/2015 - cysto NED 07/2015 - cysto NED  2 - End Stage Renal Disease - on IHD via left arm AVF MWF for ESRD. Still makes some urine voids abtou once per day.  3 - Right Non-Complex Renal Cyst - incidental Rt cyst by CT 2017, no complex features or mass effect.  Today "Mark [name]" is seen for cysto surveillance of bladder cancer No interval gross hematuria or dialysis issues.     PMH: Past Medical History  Diagnosis Date  . Hypertension   . Diabetes mellitus without complication ([place])   . Arthritis   . Chronic kidney disease   . Lymphoma ([place]) 09/27/2014  . Bladder cancer [facility])     Surgical History: Past Surgical History  Procedure Laterality Date  . Circumcision    . Back surgery  2007    Home Medications:    Medication List       This list is accurate as of: 07/06/15  2:40 PM.  Always use your most recent med list.               Acetaminophen 500 MG coapsule     albuterol 108 (90 Base) MCG/ACT inhaler  Commonly known as:  PROVENTIL HFA;VENTOLIN HFA  Inhale into the lungs.     ALCOHOL PREP PADS EX     amLODipine 10 MG tablet  Commonly known as:  NORVASC     aspirin 325 MG tablet  Take 325 mg by mouth daily.     calcitRIOL 0.25 MCG capsule  Commonly known as:  ROCALTROL  Take by mouth.     CALCIUM ACETATE (PHOS BINDER) PO  Take by mouth.     CALTRATE 600 PLUS-VIT D PO  Take by mouth.     ENSURE PLUS Liqd  Take by mouth.     fluticasone 50 MCG/ACT nasal spray  Commonly known as:  FLONASE     furosemide 80 MG tablet  Commonly  known as:  LASIX  Take 80 mg by mouth.     gabapentin 100 MG capsule  Commonly known as:  NEURONTIN  Take 1 capsule (100 mg total) by mouth at bedtime.     gabapentin 100 MG capsule  Commonly known as:  NEURONTIN  Take 1 capsule (100 mg total) by mouth 3 (three) times daily.     glimepiride 4 MG tablet  Commonly known as:  AMARYL     HCA TRIPLE ANTIBIOTIC OINTMENT EX     LEVEMIR FLEXTOUCH 100 UNIT/ML Pen  Generic drug:  Insulin Detemir     Loratadine 10 MG Caps     lovastatin 20 MG tablet  Commonly known as:  MEVACOR  Take 20 mg by mouth at bedtime.     metoCLOPramide 5 MG tablet  Commonly known as:  REGLAN  Take 5 mg by mouth 4 (four) times daily.     metoprolol 100 MG tablet  Commonly known as:  LOPRESSOR  Qam day not on dialysis, Qpm day of dialysis     multivitamin Tabs tablet  Take 1 tablet by mouth  daily.     nystatin 100000 units vaginal tablet  Place vaginally.     nystatin-triamcinolone cream  Commonly known as:  MYCOLOG II  Apply 1 application topically 2 (two) times daily.     riTUXimab [id number] 10 MG/ML injection  Commonly known as:  RITUXAN  Inject into the vein.     silver sulfADIAZINE 1 % cream  Commonly known as:  SILVADENE  Apply 1 application topically daily.     ULTICARE SHORT PEN NEEDLES 31G X 8 MM Misc  Generic drug:  Insulin Pen Needle        Allergies:  Allergies  Allergen Reactions  . Daypro [Oxaprozin] Swelling and Rash    Family History: Family History  Problem Relation Age of Onset  . Kidney cancer Neg Hx   . Prostate cancer Neg Hx   . Kidney failure Neg Hx   . Bladder Cancer Neg Hx     Social History:  reports that he quit smoking about 2 years ago. His smoking use included Cigarettes. He has never used smokeless tobacco. He reports that he does not drink alcohol or use illicit drugs.  ROS: UROLOGY Frequent Urination?: No Hard to postpone urination?: No Burning/pain with urination?: No Get up at night to urinate?:  No Leakage of urine?: No Urine stream starts and stops?: No Trouble starting stream?: No Do you have to strain to urinate?: No Blood in urine?: No Urinary tract infection?: No Sexually transmitted disease?: No Injury to kidneys or bladder?: No Painful intercourse?: No Weak stream?: No Erection problems?: No Penile pain?: No  Gastrointestinal Nausea?: No Vomiting?: No Indigestion/heartburn?: No Diarrhea?: No Constipation?: No  Constitutional Fever: No Night sweats?: No Weight loss?: No Fatigue?: No  Skin Skin rash/lesions?: No Itching?: No  Eyes Blurred vision?: Yes Double vision?: No  Ears/Nose/Throat Sore throat?: No Sinus problems?: No  Hematologic/Lymphatic Swollen glands?: No Easy bruising?: No  Cardiovascular Leg swelling?: No Chest pain?: No  Respiratory Cough?: No Shortness of breath?: No  Endocrine Excessive thirst?: No  Musculoskeletal Back pain?: No Joint pain?: No  Neurological Headaches?: No Dizziness?: No  Psychologic Depression?: No Anxiety?: No  Physical Exam: BP 165/78 mmHg  Pulse 57  Ht 5' 10.5" (1.791 m)  Wt 211 lb 12.8 oz (96.072 kg)  BMI 29.95 kg/m2  Constitutional:  Alert and oriented, No acute distress. HEENT: [place] AT, moist mucus membranes.  Trachea midline, no masses. Cardiovascular: No clubbing, cyanosis, or edema. Respiratory: Normal respiratory effort, no increased work of breathing. GI: Abdomen is soft, nontender, nondistended, no abdominal masses GU: No CVA tenderness. Phallus straight, no palpable testes asses.  Skin: No rashes, bruises or suspicious lesions. Lymph: No cervical or inguinal adenopathy. Neurologic: Grossly intact, no focal deficits, moving all 4 extremities. Psychiatric: Normal mood and affect. Left forearm AVF with palpable thrill  Laboratory Data: Lab Results  Component Value Date   WBC 5.6 05/20/2015   HGB 13.1 05/20/2015   HCT 37.1* 05/20/2015   MCV 93.1 05/20/2015   PLT 148*  05/20/2015    Lab Results  Component Value Date   CREATININE 5.26* 05/20/2015    No results found for: PSA  No results found for: TESTOSTERONE  Lab Results  Component Value Date   HGBA1C 9.3* 10/16/2013    Urinalysis    Component Value Date/Time   COLORURINE Yellow 10/20/2013 1835   APPEARANCEUR Clear 01/05/2015 1419   APPEARANCEUR Cloudy 10/20/2013 1835   LABSPEC 1.014 10/20/2013 1835   PHURINE 6.0 10/20/2013 1835   GLUCOSEU  Negative 01/05/2015 1419   GLUCOSEU 150 mg/dL 10/20/2013 1835   HGBUR Negative 10/20/2013 1835   BILIRUBINUR Positive* 01/05/2015 1419   BILIRUBINUR Negative 10/20/2013 1835   KETONESUR Negative 10/20/2013 1835   PROTEINUR 2+* 01/05/2015 1419   PROTEINUR 100 mg/dL 10/20/2013 1835   NITRITE Negative 01/05/2015 1419   NITRITE Negative 10/20/2013 1835   LEUKOCYTESUR Negative 01/05/2015 1419   LEUKOCYTESUR 1+ 10/20/2013 1835       Cystoscopy Procedure Note  Patient identification was confirmed, informed consent was obtained, and patient was prepped using Betadine solution.  Lidocaine jelly was administered per urethral meatus.    Preoperative abx where received prior to procedure.     Pre-Procedure: - Inspection reveals a normal caliber ureteral meatus.  Procedure: The flexible cystoscope was introduced without difficulty - No urethral strictures/lesions are present. - Enlarged prostate  - Normal bladder neck - Bilateral ureteral orifices identified - Bladder mucosa  reveals no ulcers, tumors, or lesions - No bladder stones - No trabeculation  Retroflexion shows no additional findings   Post-Procedure: - Patient tolerated the procedure well   Pertinent Imaging: As per HPI  Assessment & Plan:    1. History of bladder cancer - no recurrence by cysto today. continue Q85mo surveillance until 2019, then q yearly.   2. End-stage renal disease on hemodialysis (HCC) - severe medical renal disease, doing well on IHD.   3. Renal  cyst, right, on-complex - no indicaiton for further evaluation or surveillance, observe.    Return in about 6 months (around 01/05/2016).  Alexis Frock, [facility] [address], [place], [place] [address] [phone]

## 2015-07-07 LAB — URINALYSIS, COMPLETE
Bilirubin, UA: NEGATIVE
Glucose, UA: NEGATIVE
Nitrite, UA: NEGATIVE
PH UA: 5.5 (ref 5.0–7.5)
RBC, UA: NEGATIVE
Specific Gravity, UA: 1.02 (ref 1.005–1.030)
Urobilinogen, Ur: 0.2 mg/dL (ref 0.2–1.0)

## 2015-07-07 LAB — MICROSCOPIC EXAMINATION: BACTERIA UA: NONE SEEN

## 2015-08-04 ENCOUNTER — Ambulatory Visit (INDEPENDENT_AMBULATORY_CARE_PROVIDER_SITE_OTHER): Payer: Medicare HMO | Admitting: Podiatry

## 2015-08-04 DIAGNOSIS — Z87898 Personal history of other specified conditions: Secondary | ICD-10-CM

## 2015-08-04 DIAGNOSIS — E1149 Type 2 diabetes mellitus with other diabetic neurological complication: Secondary | ICD-10-CM | POA: Diagnosis not present

## 2015-08-04 DIAGNOSIS — L84 Corns and callosities: Secondary | ICD-10-CM | POA: Diagnosis not present

## 2015-08-06 ENCOUNTER — Other Ambulatory Visit: Payer: Self-pay | Admitting: Vascular Surgery

## 2015-08-10 NOTE — Progress Notes (Signed)
Dispensed diabetic shoes and 3 pairs of insoles. Instructions were reviewed and a copy was given to the patient. Patient to reappointment for regularly scheduled diabetic foot care visits or if she experiences any trouble with his diabetic shoes. Encouraged to call the office with any questions, concerns, change in symptoms.

## 2015-08-19 ENCOUNTER — Encounter: Payer: Self-pay | Admitting: *Deleted

## 2015-08-19 ENCOUNTER — Ambulatory Visit
Admission: RE | Admit: 2015-08-19 | Discharge: 2015-08-19 | Disposition: A | Discharge status: Home / Self Care | Payer: Medicare HMO | Source: Ambulatory Visit | Attending: Vascular Surgery | Admitting: Vascular Surgery

## 2015-08-19 ENCOUNTER — Encounter
Admission: RE | Disposition: A | Discharge status: Home / Self Care | Payer: Self-pay | Source: Ambulatory Visit | Attending: Vascular Surgery

## 2015-08-19 DIAGNOSIS — L97529 Non-pressure chronic ulcer of other part of left foot with unspecified severity: Secondary | ICD-10-CM | POA: Insufficient documentation

## 2015-08-19 DIAGNOSIS — Z87891 Personal history of nicotine dependence: Secondary | ICD-10-CM | POA: Diagnosis not present

## 2015-08-19 DIAGNOSIS — E1122 Type 2 diabetes mellitus with diabetic chronic kidney disease: Secondary | ICD-10-CM | POA: Diagnosis not present

## 2015-08-19 DIAGNOSIS — Z79899 Other long term (current) drug therapy: Secondary | ICD-10-CM | POA: Insufficient documentation

## 2015-08-19 DIAGNOSIS — C9590 Leukemia, unspecified not having achieved remission: Secondary | ICD-10-CM | POA: Insufficient documentation

## 2015-08-19 DIAGNOSIS — N186 End stage renal disease: Secondary | ICD-10-CM | POA: Insufficient documentation

## 2015-08-19 DIAGNOSIS — I120 Hypertensive chronic kidney disease with stage 5 chronic kidney disease or end stage renal disease: Secondary | ICD-10-CM | POA: Diagnosis not present

## 2015-08-19 DIAGNOSIS — Z8673 Personal history of transient ischemic attack (TIA), and cerebral infarction without residual deficits: Secondary | ICD-10-CM | POA: Insufficient documentation

## 2015-08-19 DIAGNOSIS — Z992 Dependence on renal dialysis: Secondary | ICD-10-CM | POA: Insufficient documentation

## 2015-08-19 DIAGNOSIS — E785 Hyperlipidemia, unspecified: Secondary | ICD-10-CM | POA: Diagnosis not present

## 2015-08-19 DIAGNOSIS — C679 Malignant neoplasm of bladder, unspecified: Secondary | ICD-10-CM | POA: Insufficient documentation

## 2015-08-19 DIAGNOSIS — Z7982 Long term (current) use of aspirin: Secondary | ICD-10-CM | POA: Insufficient documentation

## 2015-08-19 DIAGNOSIS — T82858A Stenosis of vascular prosthetic devices, implants and grafts, initial encounter: Secondary | ICD-10-CM | POA: Insufficient documentation

## 2015-08-19 DIAGNOSIS — Y832 Surgical operation with anastomosis, bypass or graft as the cause of abnormal reaction of the patient, or of later complication, without mention of misadventure at the time of the procedure: Secondary | ICD-10-CM | POA: Diagnosis not present

## 2015-08-19 HISTORY — PX: PERIPHERAL VASCULAR CATHETERIZATION: SHX172C

## 2015-08-19 LAB — POTASSIUM (ARMC VASCULAR LAB ONLY): POTASSIUM (ARMC VASCULAR LAB): 3.7 (ref 3.5–5.1)

## 2015-08-19 SURGERY — A/V SHUNTOGRAM/FISTULAGRAM
Anesthesia: Moderate Sedation | Site: Arm Upper

## 2015-08-19 MED ORDER — LIDOCAINE HCL (PF) 1 % IJ SOLN
INTRAMUSCULAR | Status: AC
  Filled 2015-08-19: qty 5

## 2015-08-19 MED ORDER — HEPARIN SODIUM (PORCINE) 1000 UNIT/ML IJ SOLN
INTRAMUSCULAR | Status: DC | PRN
  Administered 2015-08-19: 3000 [IU] via INTRAVENOUS

## 2015-08-19 MED ORDER — MIDAZOLAM HCL 2 MG/2ML IJ SOLN
INTRAMUSCULAR | Status: DC | PRN
  Administered 2015-08-19: 2 mg via INTRAVENOUS

## 2015-08-19 MED ORDER — DEXTROSE 5 % IV SOLN
INTRAVENOUS | Status: AC
  Filled 2015-08-19 (×12): qty 1.5

## 2015-08-19 MED ORDER — HYDRALAZINE HCL 20 MG/ML IJ SOLN: 5.0000 mg | INTRAMUSCULAR | Status: DC | PRN

## 2015-08-19 MED ORDER — HEPARIN SODIUM (PORCINE) 1000 UNIT/ML IJ SOLN
INTRAMUSCULAR | Status: AC
  Filled 2015-08-19: qty 1

## 2015-08-19 MED ORDER — MIDAZOLAM HCL 5 MG/5ML IJ SOLN
INTRAMUSCULAR | Status: AC
  Filled 2015-08-19: qty 5

## 2015-08-19 MED ORDER — SODIUM CHLORIDE 0.9 % IV SOLN
INTRAVENOUS | Status: DC
  Administered 2015-08-19 (×2): via INTRAVENOUS

## 2015-08-19 MED ORDER — MORPHINE SULFATE (PF) 4 MG/ML IV SOLN: 2.0000 mg | INTRAVENOUS | Status: DC | PRN

## 2015-08-19 MED ORDER — ACETAMINOPHEN 325 MG RE SUPP: 325.0000 mg | RECTAL | Status: DC | PRN

## 2015-08-19 MED ORDER — OXYCODONE-ACETAMINOPHEN 5-325 MG PO TABS: 1.0000 | ORAL_TABLET | ORAL | Status: DC | PRN

## 2015-08-19 MED ORDER — HYDROMORPHONE HCL 1 MG/ML IJ SOLN: 1.0000 mg | Freq: Once | INTRAMUSCULAR | Status: DC

## 2015-08-19 MED ORDER — IOPAMIDOL (ISOVUE-300) INJECTION 61%
INTRAVENOUS | Status: DC | PRN
  Administered 2015-08-19: 35 mL via INTRA_ARTERIAL

## 2015-08-19 MED ORDER — PHENOL 1.4 % MT LIQD: 1.0000 | OROMUCOSAL | Status: DC | PRN

## 2015-08-19 MED ORDER — ACETAMINOPHEN 325 MG PO TABS: 325.0000 mg | ORAL_TABLET | ORAL | Status: DC | PRN

## 2015-08-19 MED ORDER — METOPROLOL TARTRATE 5 MG/5ML IV SOLN: 2.0000 mg | INTRAVENOUS | Status: DC | PRN

## 2015-08-19 MED ORDER — GUAIFENESIN-DM 100-10 MG/5ML PO SYRP: 15.0000 mL | ORAL_SOLUTION | ORAL | Status: DC | PRN

## 2015-08-19 MED ORDER — FENTANYL CITRATE (PF) 100 MCG/2ML IJ SOLN
INTRAMUSCULAR | Status: DC | PRN
  Administered 2015-08-19: 50 ug via INTRAVENOUS

## 2015-08-19 MED ORDER — ONDANSETRON HCL 4 MG/2ML IJ SOLN: 4.0000 mg | Freq: Four times a day (QID) | INTRAMUSCULAR | Status: DC | PRN

## 2015-08-19 MED ORDER — FAMOTIDINE 20 MG PO TABS: 40.0000 mg | ORAL_TABLET | ORAL | Status: DC | PRN

## 2015-08-19 MED ORDER — METHYLPREDNISOLONE SODIUM SUCC 125 MG IJ SOLR: 125.0000 mg | INTRAMUSCULAR | Status: DC | PRN

## 2015-08-19 MED ORDER — LIDOCAINE-EPINEPHRINE (PF) 1 %-1:200000 IJ SOLN
INTRAMUSCULAR | Status: DC | PRN
  Administered 2015-08-19: 10 mL via INTRADERMAL

## 2015-08-19 MED ORDER — LABETALOL HCL 5 MG/ML IV SOLN: 10.0000 mg | INTRAVENOUS | Status: DC | PRN

## 2015-08-19 MED ORDER — DEXTROSE 5 % IV SOLN
1.5000 g | INTRAVENOUS | Status: AC
  Administered 2015-08-19: 1.5 g via INTRAVENOUS

## 2015-08-19 MED ORDER — FENTANYL CITRATE (PF) 100 MCG/2ML IJ SOLN
INTRAMUSCULAR | Status: AC
  Filled 2015-08-19: qty 2

## 2015-08-19 MED ORDER — ALUM & MAG HYDROXIDE-SIMETH 200-200-20 MG/5ML PO SUSP: 15.0000 mL | ORAL | Status: DC | PRN

## 2015-08-19 SURGICAL SUPPLY — 10 items
BALLN DORADO 7X40X80 (BALLOONS) ×4
BALLOON DORADO 7X40X80 (BALLOONS) ×2 IMPLANT
CANNULA 5F STIFF (CANNULA) ×4 IMPLANT
CATH 5F KA2 (CATHETERS) ×4 IMPLANT
DEVICE PRESTO INFLATION (MISCELLANEOUS) ×4 IMPLANT
DRAPE BRACHIAL (DRAPES) ×4 IMPLANT
PACK ANGIOGRAPHY (CUSTOM PROCEDURE TRAY) ×4 IMPLANT
SHEATH BRITE TIP 6FRX5.5 (SHEATH) ×4 IMPLANT
TOWEL OR 17X26 4PK STRL BLUE (TOWEL DISPOSABLE) ×4 IMPLANT
WIRE MAGIC TOR.035 180C (WIRE) ×4 IMPLANT

## 2015-08-19 NOTE — Discharge Instructions (Signed)
Fistulogram, Care After °Refer to this sheet in the next few weeks. These instructions provide you with information on caring for yourself after your procedure. Your health care provider may also give you more specific instructions. Your treatment has been planned according to current medical practices, but problems sometimes occur. Call your health care provider if you have any problems or questions after your procedure. °WHAT TO EXPECT AFTER THE PROCEDURE °After your procedure, it is typical to have the following: °· A small amount of discomfort in the area where the catheters were placed. °· A small amount of bruising around the fistula. °· Sleepiness and fatigue. °HOME CARE INSTRUCTIONS °· Rest at home for the day following your procedure. °· Do not drive or operate heavy machinery while taking pain medicine. °· Take medicines only as directed by your health care provider. °· Do not take baths, swim, or use a hot tub until your health care provider approves. You may shower 24 hours after the procedure or as directed by your health care provider. °· There are many different ways to close and cover an incision, including stitches, skin glue, and adhesive strips. Follow your health care provider's instructions on: °¨ Incision care. °¨ Bandage (dressing) changes and removal. °¨ Incision closure removal. °· Monitor your dialysis fistula carefully. °SEEK MEDICAL CARE IF: °· You have drainage, redness, swelling, or pain at your catheter site. °· You have a fever. °· You have chills. °SEEK IMMEDIATE MEDICAL CARE IF: °· You feel weak. °· You have trouble balancing. °· You have trouble moving your arms or legs. °· You have problems with your speech or vision. °· You can no longer feel a vibration or buzz when you put your fingers over your dialysis fistula. °· The limb that was used for the procedure: °¨ Swells. °¨ Is painful. °¨ Is cold. °¨ Is discolored, such as blue or pale white. °  °This information is not intended  to replace advice given to you by your health care provider. Make sure you discuss any questions you have with your health care provider. °  °Document Released: 08/04/2013 Document Reviewed: 08/04/2013 °Elsevier Interactive Patient Education ©2016 [employer] ° °

## 2015-08-19 NOTE — H&P (Signed)
  [facility] History & Physical Update  The patient was interviewed and re-examined.  The patient's previous History and Physical has been reviewed and is unchanged.  There is no change in the plan of care. We plan to proceed with the scheduled procedure.  Zayan Delvecchio, MD  08/19/2015, 8:04 AM

## 2015-08-19 NOTE — Op Note (Signed)
[facility]    OPERATIVE NOTE   PROCEDURE: 1.   Left radiocephalic arteriovenous fistula cannulation under ultrasound guidance 2.   Left arm fistulagram including central venogram 3.   Percutaneous transluminal angioplasty of mid and distal forearm cephalic vein with 7 mm diameter high pressure angioplasty balloon  PRE-OPERATIVE DIAGNOSIS: 1. ESRD 2. Poorly functional left radiocephalic AVF  POST-OPERATIVE DIAGNOSIS: same as above   SURGEON: Leotis Pain, MD  ANESTHESIA: local with MCS  ESTIMATED BLOOD LOSS: Minimal   FINDING(S): 1. Moderate stenosis in the 60% range on either side of an aneurysmal access site in the mid to distal forearm cephalic vein  SPECIMEN(S):  None  CONTRAST: 35 cc  FLUORO TIME: 1.2 minutes  MODERATE CONSCIOUS SEDATION TIME: Approximately 20 minutes with 2 mg of Versed and 50 mcg of Fentanyl   INDICATIONS: Roczen J [name]. is a 70 y.o. male who presents with malfunctioning  left radiocephalic arteriovenous fistula.  The patient is scheduled for  left arm fistulagram.  The patient is aware the risks include but are not limited to: bleeding, infection, thrombosis of the cannulated access, and possible anaphylactic reaction to the contrast.  The patient is aware of the risks of the procedure and elects to proceed forward.  DESCRIPTION: After full informed written consent was obtained, the patient was brought back to the angiography suite and placed supine upon the angiography table.  The patient was connected to monitoring equipment. Moderate conscious sedation was administered with a face to face encounter with the patient throughout the procedure with my supervision of the RN administering medicines and monitoring the patient's vital signs and mental status throughout from the start of the procedure until the patient was taken to the recovery room. The  left arm was prepped and draped in the standard fashion for a percutaneous  access intervention.  Under ultrasound guidance, the  left radiocephalic arteriovenous fistula was cannulated in a retrograde fashion near the antecubital fossa with a micropuncture needle under direct ultrasound guidance and a permanent image was performed.  The microwire was advanced into the fistula and the needle was exchanged for the a microsheath.  I then upsized to a 6 Fr Sheath and imaging was performed.  Hand injections were completed to image the access including the central venous system. Imaging of the arterial anastomosis and initial portions of the fistula were performed with a Kumpe catheter placed through the retrograde sheath. This demonstrated moderate stenosis in the 60% range on either side of an aneurysmal access site in the mid to distal forearm cephalic vein.  Based on the images, this patient will need intervention to these areas. I then gave the patient 3000 units of intravenous heparin.  I then crossed the stenosis with a Magic Tourqe wire.  Based on the imaging, a 7 mm x 6 cm  high pressure angioplasty balloon was selected.  The balloon was centered around the mid and distal forearm cephalic vein stenosis and inflated to 16 ATM for 1 minute(s).  2 inflations were required to treat both areas and the second inflation was also to 16 atm. On completion imaging, a 25-30 % residual stenosis was present in both locations.     Based on the completion imaging, no further intervention is necessary.  The wire and balloon were removed from the sheath.  A 4-0 Monocryl purse-string suture was sewn around the sheath.  The sheath was removed while tying down the suture.  A sterile bandage was applied to  the puncture site.  COMPLICATIONS: None  CONDITION: Stable   Denorris Reust  08/19/2015 9:00 AM

## 2015-08-19 NOTE — Progress Notes (Signed)
Pt clinically stable post procedure, awake, alert and oriented, wife present, denies complaints, Dr Lucky Cowboy out to speak with patient with  Questions answered, vss.

## 2015-08-20 ENCOUNTER — Encounter: Payer: Self-pay | Admitting: Vascular Surgery

## 2015-08-26 ENCOUNTER — Encounter: Payer: Self-pay | Admitting: Podiatry

## 2015-08-26 ENCOUNTER — Ambulatory Visit (INDEPENDENT_AMBULATORY_CARE_PROVIDER_SITE_OTHER): Payer: Medicare HMO | Admitting: Podiatry

## 2015-08-26 DIAGNOSIS — B351 Tinea unguium: Secondary | ICD-10-CM | POA: Diagnosis not present

## 2015-08-26 DIAGNOSIS — M79676 Pain in unspecified toe(s): Secondary | ICD-10-CM

## 2015-08-26 DIAGNOSIS — E1149 Type 2 diabetes mellitus with other diabetic neurological complication: Secondary | ICD-10-CM | POA: Diagnosis not present

## 2015-08-26 DIAGNOSIS — L84 Corns and callosities: Secondary | ICD-10-CM

## 2015-09-02 NOTE — Progress Notes (Signed)
Patient ID: Mark [name]., male   DOB: 1945/09/09, 70 y.o.   MRN: [id number]  Subjective: 70 y.o. returns the office today for painful, elongated, thickened toenails which hecannot trim himself. Denies any redness or drainage around the nails. Denies any acute changes since last appointment and no new complaints today. Denies any systemic complaints such as fevers, chills, nausea, vomiting.   Objective: AAO 3, NAD DP/PT pulses palpable, CRT less than 3 seconds Protective sensation decreased with Simms Weinstein monofilament Nails hypertrophic, dystrophic, elongated, brittle, discolored 10. There is tenderness overlying the nails 1-5 bilaterally. There is no surrounding erythema or drainage along the nail sites. Left foot second metatarsal 5 hyperkeratotic lesions. Upon debridement no underlying ulceration, drainage or other signs of infection. No open lesions or pre-ulcerative lesions are identified. No other areas of tenderness bilateral lower extremities. No overlying edema, erythema, increased warmth. No pain with calf compression, swelling, warmth, erythema.  Assessment: Patient presents with symptomatic onychomycosis; pre-ulcerative callus  Plan: -Treatment options including alternatives, risks, complications were discussed -Hyperkeratotic lesion was divided 1 without complications or bleeding. -Nails sharply debrided 10 without complication/bleeding. -A modified his insert to take more pressure off the fifth metatarsal head area. -Discussed daily foot inspection. If there are any changes, to call the office immediately.  -Follow-up in 3 months or sooner if any problems are to arise. In the meantime, encouraged to call the office with any questions, concerns, changes symptoms.  Celesta Gentile, DPM

## 2015-09-07 ENCOUNTER — Ambulatory Visit
Admission: RE | Admit: 2015-09-07 | Discharge: 2015-09-07 | Disposition: A | Discharge status: Home / Self Care | Payer: Medicare HMO | Source: Ambulatory Visit | Attending: Internal Medicine | Admitting: Internal Medicine

## 2015-09-07 DIAGNOSIS — M5186 Other intervertebral disc disorders, lumbar region: Secondary | ICD-10-CM | POA: Insufficient documentation

## 2015-09-07 DIAGNOSIS — C9110 Chronic lymphocytic leukemia of B-cell type not having achieved remission: Secondary | ICD-10-CM | POA: Diagnosis present

## 2015-09-07 DIAGNOSIS — Z8551 Personal history of malignant neoplasm of bladder: Secondary | ICD-10-CM | POA: Diagnosis not present

## 2015-09-07 DIAGNOSIS — I700 Atherosclerosis of aorta: Secondary | ICD-10-CM | POA: Insufficient documentation

## 2015-09-07 DIAGNOSIS — N200 Calculus of kidney: Secondary | ICD-10-CM | POA: Diagnosis not present

## 2015-09-07 DIAGNOSIS — R599 Enlarged lymph nodes, unspecified: Secondary | ICD-10-CM | POA: Diagnosis not present

## 2015-09-08 ENCOUNTER — Ambulatory Visit: Payer: Medicare HMO

## 2015-09-09 ENCOUNTER — Inpatient Hospital Stay (HOSPITAL_BASED_OUTPATIENT_CLINIC_OR_DEPARTMENT_OTHER): Payer: Medicare HMO | Admitting: Internal Medicine

## 2015-09-09 ENCOUNTER — Other Ambulatory Visit: Payer: Self-pay

## 2015-09-09 ENCOUNTER — Inpatient Hospital Stay: Payer: Medicare HMO | Attending: Internal Medicine

## 2015-09-09 VITALS — BP 163/70 | HR 55 | Temp 97.0°F | Resp 19 | Wt 207.7 lb

## 2015-09-09 DIAGNOSIS — C9510 Chronic leukemia of unspecified cell type not having achieved remission: Secondary | ICD-10-CM

## 2015-09-09 DIAGNOSIS — I129 Hypertensive chronic kidney disease with stage 1 through stage 4 chronic kidney disease, or unspecified chronic kidney disease: Secondary | ICD-10-CM | POA: Diagnosis not present

## 2015-09-09 DIAGNOSIS — N189 Chronic kidney disease, unspecified: Secondary | ICD-10-CM | POA: Diagnosis not present

## 2015-09-09 DIAGNOSIS — Z7982 Long term (current) use of aspirin: Secondary | ICD-10-CM | POA: Diagnosis not present

## 2015-09-09 DIAGNOSIS — E1122 Type 2 diabetes mellitus with diabetic chronic kidney disease: Secondary | ICD-10-CM | POA: Insufficient documentation

## 2015-09-09 DIAGNOSIS — Z992 Dependence on renal dialysis: Secondary | ICD-10-CM | POA: Insufficient documentation

## 2015-09-09 DIAGNOSIS — Z79899 Other long term (current) drug therapy: Secondary | ICD-10-CM | POA: Diagnosis not present

## 2015-09-09 DIAGNOSIS — C9110 Chronic lymphocytic leukemia of B-cell type not having achieved remission: Secondary | ICD-10-CM

## 2015-09-09 DIAGNOSIS — Z87891 Personal history of nicotine dependence: Secondary | ICD-10-CM | POA: Insufficient documentation

## 2015-09-09 DIAGNOSIS — Z794 Long term (current) use of insulin: Secondary | ICD-10-CM | POA: Diagnosis not present

## 2015-09-09 DIAGNOSIS — M1990 Unspecified osteoarthritis, unspecified site: Secondary | ICD-10-CM | POA: Insufficient documentation

## 2015-09-09 DIAGNOSIS — D631 Anemia in chronic kidney disease: Secondary | ICD-10-CM

## 2015-09-09 DIAGNOSIS — M21371 Foot drop, right foot: Secondary | ICD-10-CM | POA: Insufficient documentation

## 2015-09-09 DIAGNOSIS — Z8551 Personal history of malignant neoplasm of bladder: Secondary | ICD-10-CM | POA: Insufficient documentation

## 2015-09-09 DIAGNOSIS — D696 Thrombocytopenia, unspecified: Secondary | ICD-10-CM | POA: Insufficient documentation

## 2015-09-09 DIAGNOSIS — R911 Solitary pulmonary nodule: Secondary | ICD-10-CM | POA: Diagnosis not present

## 2015-09-09 LAB — COMPREHENSIVE METABOLIC PANEL
ALBUMIN: 4.7 g/dL (ref 3.5–5.0)
ALK PHOS: 70 U/L (ref 38–126)
ALT: 23 U/L (ref 17–63)
ANION GAP: 11 (ref 5–15)
AST: 24 U/L (ref 15–41)
BILIRUBIN TOTAL: 0.6 mg/dL (ref 0.3–1.2)
BUN: 43 mg/dL — AB (ref 6–20)
CHLORIDE: 101 mmol/L (ref 101–111)
CO2: 26 mmol/L (ref 22–32)
CREATININE: 5.99 mg/dL — AB (ref 0.61–1.24)
Calcium: 8.8 mg/dL — ABNORMAL LOW (ref 8.9–10.3)
GFR calc Af Amer: 10 mL/min — ABNORMAL LOW (ref >60)
GFR, EST NON AFRICAN AMERICAN: 9 mL/min — AB (ref >60)
Glucose, Bld: 121 mg/dL — ABNORMAL HIGH (ref 65–99)
Potassium: 3.8 mmol/L (ref 3.5–5.1)
Sodium: 138 mmol/L (ref 135–145)
Total Protein: 7.2 g/dL (ref 6.5–8.1)

## 2015-09-09 LAB — CBC WITH DIFFERENTIAL/PLATELET
BASOS ABS: 0 10*3/uL (ref 0–0.1)
Basophils Relative: 1 %
EOS PCT: 8 %
Eosinophils Absolute: 0.3 10*3/uL (ref 0–0.7)
HEMATOCRIT: 35.7 % — AB (ref 40.0–52.0)
Hemoglobin: 12.3 g/dL — ABNORMAL LOW (ref 13.0–18.0)
LYMPHS ABS: 1.5 10*3/uL (ref 1.0–3.6)
LYMPHS PCT: 34 %
MCH: 32.3 pg (ref 26.0–34.0)
MCHC: 34.5 g/dL (ref 32.0–36.0)
MCV: 93.8 fL (ref 80.0–100.0)
MONO ABS: 0.4 10*3/uL (ref 0.2–1.0)
MONOS PCT: 9 %
NEUTROS ABS: 2.1 10*3/uL (ref 1.4–6.5)
Neutrophils Relative %: 48 %
Platelets: 139 10*3/uL — ABNORMAL LOW (ref 150–440)
RBC: 3.81 MIL/uL — ABNORMAL LOW (ref 4.40–5.90)
RDW: 13.8 % (ref 11.5–14.5)
WBC: 4.3 10*3/uL (ref 3.8–10.6)

## 2015-09-09 LAB — LACTATE DEHYDROGENASE: LDH: 159 U/L (ref 98–192)

## 2015-09-09 NOTE — Progress Notes (Signed)
Follow up visit today for CLL. Denies fever or night sweats. No swollen lymph nodes that he is aware of. Patient is in a wheelchair with foot drop d/t a back surgery and degenerative disc disease. Appetite is good. Stools are frequent and soft. Denies random pain. Does have chronic back pain.

## 2015-09-09 NOTE — Progress Notes (Signed)
[facility] OFFICE PROGRESS NOTE  Patient Care Team: Marden Noble, MD as PCP - General (Internal Medicine)   SUMMARY OF ONCOLOGIC HISTORY:  # DEC 2014- SMALL CELL LYMPHOMA/CLL- STAGE IV [BMBx- 03/2013- 20-30% B cell infiltrate; CD 5+/CD 20+]; FISH- Trisomy 12;DEC 2014-[facility]-Rituxan x2 [held sec to prolonged hospital/pneumoani/HD]; Rituxa q8W [last June 2015; Held sec to Superficial Bladder ca]; CT- JUNE 2017- STABLE Mediastinal LN/supraclav LN/pelvic LN;   # 22mm RLL lung nodule- F/u in 6 M [Hx of smoking quit 2015]  # ESRD on HD [Dr.Voora]; DM; Non-healing ulcers in feet/Hx R foot drop- walker ambulation  INTERVAL HISTORY:  A very pleasant 70 year old African-American male patient with above history of SLL/CLL currently on surveillance is here for follow-up. Denies any new lumps or bumps.  Since the last visit he has been more mobile. He still continues to use walker as needed; wheelchair as needed.  Patient denies any unusual weight loss or night sweats or new lumps or bumps.   His foot ulcers have improved; healed up. He continues to have chronic foot drop on the right side for which he wears a brace.  Denies any shortness of breath or cough.  REVIEW OF SYSTEMS:  A complete 10 point review of system is done which is negative except mentioned above/history of present illness.   PAST MEDICAL HISTORY :  Past Medical History  Diagnosis Date  . Hypertension   . Diabetes mellitus without complication ([place])   . Arthritis   . Chronic kidney disease   . Lymphoma ([place]) 09/27/2014  . Bladder cancer ([place])     PAST SURGICAL HISTORY :   Past Surgical History  Procedure Laterality Date  . Circumcision    . Back surgery  2007  . Peripheral vascular catheterization Left 08/19/2015    Procedure: A/V Shuntogram/Fistulagram;  Surgeon: Algernon Huxley, MD;  Location: [facility];  Service: Cardiovascular;  Laterality: Left;  . Peripheral vascular catheterization N/A  08/19/2015    Procedure: A/V Shunt Intervention;  Surgeon: Algernon Huxley, MD;  Location: [facility];  Service: Cardiovascular;  Laterality: N/A;    FAMILY HISTORY :   Family History  Problem Relation Age of Onset  . Kidney cancer Neg Hx   . Prostate cancer Neg Hx   . Kidney failure Neg Hx   . Bladder Cancer Neg Hx     SOCIAL HISTORY:   Social History  Substance Use Topics  . Smoking status: Former Smoker    Types: Cigarettes    Quit date: 05/19/2013  . Smokeless tobacco: Never Used     Comment: quit  . Alcohol Use: No    ALLERGIES:  is allergic to daypro.  MEDICATIONS:  Current Outpatient Prescriptions  Medication Sig Dispense Refill  . Acetaminophen 500 MG coapsule     . albuterol (PROVENTIL HFA;VENTOLIN HFA) 108 (90 BASE) MCG/ACT inhaler Inhale 2 puffs into the lungs every 4 (four) hours as needed.     [name] amLODipine (NORVASC) 10 MG tablet 10 mg daily.     [name] aspirin 325 MG tablet Take 325 mg by mouth daily.    . calcitRIOL (ROCALTROL) 0.25 MCG capsule Take by mouth daily.     [name] CALCIUM ACETATE, PHOS BINDER, PO Take by mouth daily.     . Calcium-Vitamin D (CALTRATE 600 PLUS-VIT D PO) Take by mouth.    . ENSURE PLUS (ENSURE PLUS) LIQD Take by mouth.    . fluticasone (FLONASE) 50 MCG/ACT nasal spray  Place 1 spray into both nostrils daily.     . furosemide (LASIX) 80 MG tablet Take 80 mg by mouth. 2 tablets in am 1 tablet in PM    . gabapentin (NEURONTIN) 100 MG capsule Take 1 capsule (100 mg total) by mouth 3 (three) times daily. 90 capsule 3  . glimepiride (AMARYL) 4 MG tablet 4 mg. 2 tablets at bedtime    . Isopropyl Alcohol (ALCOHOL PREP PADS EX)     . LEVEMIR FLEXTOUCH 100 UNIT/ML Pen Inject 30 Units into the skin daily at 10 pm.     . lovastatin (MEVACOR) 20 MG tablet Take 20 mg by mouth at bedtime.    . metoCLOPramide (REGLAN) 5 MG tablet Take 5 mg by mouth 2 (two) times daily at 10 AM and 5 PM.     . metoprolol (LOPRESSOR) 100 MG tablet Qam day not on  dialysis, Qpm day of dialysis    . multivitamin (RENA-VIT) TABS tablet Take 1 tablet by mouth daily.    [name] Neomycin-Bacitracin-Polymyxin (HCA TRIPLE ANTIBIOTIC OINTMENT EX) Use as needed    . nystatin-triamcinolone (MYCOLOG II) cream Apply 1 application topically 2 (two) times daily.    [name] ULTICARE SHORT PEN NEEDLES 31G X 8 MM MISC     . riTUXimab [id number] (RITUXAN) 10 MG/ML injection Inject into the vein.    . silver sulfADIAZINE (SILVADENE) 1 % cream Apply 1 application topically daily. (Patient not taking: Reported on 09/09/2015) 50 g 1   No current facility-administered medications for this visit.    PHYSICAL EXAMINATION: ECOG PERFORMANCE STATUS: 3  BP 163/70 mmHg  Pulse 55  Temp(Src) 97 F (36.1 C) (Tympanic)  Resp 19  Wt 207 lb 10.8 oz (94.2 kg)  SpO2 97%  Filed Weights   09/09/15 1112  Weight: 207 lb 10.8 oz (94.2 kg)    GENERAL: Well-nourished well-developed; Alert, no distress and comfortable.  Accompanied by son; pt is ina wheel chair. EYES: no pallor or icterus OROPHARYNX: no thrush or ulceration NECK: supple, no masses felt LYMPH:  no palpable lymphadenopathy in the cervical, axillary or inguinal regions LUNGS: clear to auscultation and  No wheeze or crackles HEART/CVS: regular rate & rhythm and no murmurs; No lower extremity edema;  R-LE in brace.  ABDOMEN:abdomen soft, non-tender and normal bowel sounds Musculoskeletal:no cyanosis of digits and no clubbing  PSYCH: alert & oriented x 3 with fluent speech NEURO: no focal motor/sensory deficits SKIN:  no rashes or significant lesions  LABORATORY DATA:  I have reviewed the data as listed    Component Value Date/Time   NA 138 09/09/2015 1015   NA 140 11/18/2013 1030   K 3.8 09/09/2015 1015   K 4.0 11/18/2013 1030   CL 101 09/09/2015 1015   CL 97* 11/18/2013 1030   CO2 26 09/09/2015 1015   CO2 33* 11/18/2013 1030   GLUCOSE 121* 09/09/2015 1015   GLUCOSE 265* 11/18/2013 1030   BUN 43* 09/09/2015 1015   BUN 30*  11/18/2013 1030   CREATININE 5.99* 09/09/2015 1015   CREATININE 5.05* 11/18/2013 1030   CALCIUM 8.8* 09/09/2015 1015   CALCIUM 8.6 11/18/2013 1030   PROT 7.2 09/09/2015 1015   PROT 7.0 11/18/2013 1030   ALBUMIN 4.7 09/09/2015 1015   ALBUMIN 3.7 11/18/2013 1030   AST 24 09/09/2015 1015   AST 15 11/18/2013 1030   ALT 23 09/09/2015 1015   ALT 25 11/18/2013 1030   ALKPHOS 70 09/09/2015 1015   ALKPHOS 105 11/18/2013 1030  BILITOT 0.6 09/09/2015 1015   BILITOT 0.3 11/18/2013 1030   GFRNONAA 9* 09/09/2015 1015   GFRNONAA 11* 11/18/2013 1030   GFRAA 10* 09/09/2015 1015   GFRAA 13* 11/18/2013 1030    No results found for: SPEP, UPEP  Lab Results  Component Value Date   WBC 4.3 09/09/2015   NEUTROABS 2.1 09/09/2015   HGB 12.3* 09/09/2015   HCT 35.7* 09/09/2015   MCV 93.8 09/09/2015   PLT 139* 09/09/2015      Chemistry      Component Value Date/Time   NA 138 09/09/2015 1015   NA 140 11/18/2013 1030   K 3.8 09/09/2015 1015   K 4.0 11/18/2013 1030   CL 101 09/09/2015 1015   CL 97* 11/18/2013 1030   CO2 26 09/09/2015 1015   CO2 33* 11/18/2013 1030   BUN 43* 09/09/2015 1015   BUN 30* 11/18/2013 1030   CREATININE 5.99* 09/09/2015 1015   CREATININE 5.05* 11/18/2013 1030      Component Value Date/Time   CALCIUM 8.8* 09/09/2015 1015   CALCIUM 8.6 11/18/2013 1030   ALKPHOS 70 09/09/2015 1015   ALKPHOS 105 11/18/2013 1030   AST 24 09/09/2015 1015   AST 15 11/18/2013 1030   ALT 23 09/09/2015 1015   ALT 25 11/18/2013 1030   BILITOT 0.6 09/09/2015 1015   BILITOT 0.3 11/18/2013 1030     ASSESSMENT & PLAN:   # SLL/CLL- stage IV- currently on surveillance given multiple comorbidities. Reviewed the most recent CT scan in June 2017 no evidence of any gross progression; one of the right pelvic lymph node has stable around 1.5-2 cm. I reviewed the images myself reviewed with the patient and family.   Patient is asymptomatic from his CLL/SLL at this time. CBC shows mild anemia  [likely secondary to CKD]; mild thrombocytopenia platelets 129 [C discussion below]. For now recommend surveillance.  # Mild thrombocytopenia platelets 129- reviewed at length with the patient's son that this could be a manifestation of CLL; however this does not merit any treatment at this time. Patient states that his labs checked on a monthly basis at dialysis. I've asked him to monitor the counts closely and his platelets started trending down less than 100 let us know. He agrees.  # right middle lobe 7 mm groundglass opacity- recommend follow-up CT scan in 6 months.  # superficial bladder cancer/ follow-up with urology.   # Follow-up with [place] with CT scan chest noncontrast.   # 25 minutes face-to-face with the patient discussing the above plan of care; more than 50% of time spent on prognosis/ natural history; counseling and coordination.     Orders Placed This Encounter  Procedures  . CT Chest Wo Contrast    Standing Status: Future     Number of Occurrences:      Standing Expiration Date: 11/08/2016    Order Specific Question:  Reason for Exam (SYMPTOM  OR DIAGNOSIS REQUIRED)    Answer:  lung nodule    Order Specific Question:  Preferred imaging location?    Answer:  [facility]  . CBC with Differential/Platelet    Standing Status: Future     Number of Occurrences:      Standing Expiration Date: 10/13/2016  . Comprehensive metabolic panel    Standing Status: Future     Number of Occurrences:      Standing Expiration Date: 10/13/2016  . Lactate dehydrogenase    Standing Status: Future     Number of Occurrences:  Standing Expiration Date: 10/13/2016       Cammie Sickle, MD 09/09/2015 11:34 AM

## 2015-09-10 ENCOUNTER — Other Ambulatory Visit: Payer: Medicare HMO

## 2015-09-10 ENCOUNTER — Ambulatory Visit: Payer: Medicare HMO | Admitting: Internal Medicine

## 2015-11-30 ENCOUNTER — Other Ambulatory Visit: Payer: Self-pay | Admitting: Nephrology

## 2015-11-30 DIAGNOSIS — R109 Unspecified abdominal pain: Secondary | ICD-10-CM

## 2015-12-02 ENCOUNTER — Ambulatory Visit (INDEPENDENT_AMBULATORY_CARE_PROVIDER_SITE_OTHER): Payer: Medicare HMO | Admitting: Podiatry

## 2015-12-02 ENCOUNTER — Encounter: Payer: Self-pay | Admitting: Podiatry

## 2015-12-02 ENCOUNTER — Ambulatory Visit: Payer: Medicare HMO

## 2015-12-02 DIAGNOSIS — L989 Disorder of the skin and subcutaneous tissue, unspecified: Secondary | ICD-10-CM | POA: Diagnosis not present

## 2015-12-02 DIAGNOSIS — E1149 Type 2 diabetes mellitus with other diabetic neurological complication: Secondary | ICD-10-CM

## 2015-12-02 DIAGNOSIS — B351 Tinea unguium: Secondary | ICD-10-CM | POA: Diagnosis not present

## 2015-12-02 DIAGNOSIS — L97511 Non-pressure chronic ulcer of other part of right foot limited to breakdown of skin: Secondary | ICD-10-CM | POA: Diagnosis not present

## 2015-12-02 DIAGNOSIS — L97521 Non-pressure chronic ulcer of other part of left foot limited to breakdown of skin: Secondary | ICD-10-CM

## 2015-12-02 DIAGNOSIS — M79676 Pain in unspecified toe(s): Secondary | ICD-10-CM | POA: Diagnosis not present

## 2015-12-06 NOTE — Progress Notes (Signed)
Patient ID: Mark [name]., male   DOB: 07/26/1945, 70 y.o.   MRN: [id number]  Subjective: 70 y.o. returns the office today for painful, elongated, thickened toenails which hecannot trim himself. Denies any redness or drainage around the nails. Denies any acute changes since last appointment and no new complaints today. Denies any systemic complaints such as fevers, chills, nausea, vomiting.   Objective: AAO 3, NAD DP/PT pulses palpable, CRT less than 3 seconds Protective sensation decreased with Simms Weinstein monofilament Nails hypertrophic, dystrophic, elongated, brittle, discolored 10. There is tenderness overlying the nails 1-5 bilaterally. There is no surrounding erythema or drainage along the nail sites. Left foot sub-metatarsal 5 hyperkeratotic lesions. Upon debridement no underlying ulceration, drainage or other signs of infection. No open lesions or pre-ulcerative lesions are identified. On the plantar aspect of the right hallux is what appears to be a mole however subjective he states his been somewhat bigger. Upon debridement the top layer skin did come off and there is a small superficial underlying wound appear to be also pinpoint type opening a small amount of bloody drainage was expressed. There is no draining or pus otherwise. There is no swelling erythema, edema, ascending synovitis other signs of infection. The scrapings that were removed were sent for biopsy. No other areas of tenderness bilateral lower extremities. No overlying edema, erythema, increased warmth. No pain with calf compression, swelling, warmth, erythema.  Assessment: Patient presents with symptomatic onychomycosis; pre-ulcerative callus; lesion right hallux  Plan: -Treatment options including alternatives, risks, complications were discussed -Hyperkeratotic lesion was divided 1 without complications or bleeding. -Nails sharply debrided 10 without complication/bleeding. -Lesion right hallux was  debrided in the skin was sent for biopsy. Continue antibiotic ointment dressing changes daily. Follow-up as scheduled in the next couple weeks to check this wound or sooner if needed. -Discussed daily foot inspection. If there are any changes, to call the office immediately.  -Follow-up in 3 months for routine care or sooner if any problems are to arise. In the meantime, encouraged to call the office with any questions, concerns, changes symptoms.  Celesta Gentile, DPM

## 2015-12-08 NOTE — Addendum Note (Signed)
Addended by: Cranford Mon R on: 12/08/2015 07:42 AM   Modules accepted: Orders

## 2015-12-09 ENCOUNTER — Ambulatory Visit
Admission: RE | Admit: 2015-12-09 | Discharge: 2015-12-09 | Disposition: A | Discharge status: Home / Self Care | Payer: Medicare HMO | Source: Ambulatory Visit | Attending: Nephrology | Admitting: Nephrology

## 2015-12-09 DIAGNOSIS — R93421 Abnormal radiologic findings on diagnostic imaging of right kidney: Secondary | ICD-10-CM | POA: Diagnosis not present

## 2015-12-09 DIAGNOSIS — N281 Cyst of kidney, acquired: Secondary | ICD-10-CM | POA: Insufficient documentation

## 2015-12-09 DIAGNOSIS — R109 Unspecified abdominal pain: Secondary | ICD-10-CM

## 2015-12-09 DIAGNOSIS — R93422 Abnormal radiologic findings on diagnostic imaging of left kidney: Secondary | ICD-10-CM | POA: Diagnosis not present

## 2015-12-23 ENCOUNTER — Ambulatory Visit (INDEPENDENT_AMBULATORY_CARE_PROVIDER_SITE_OTHER): Payer: Medicare HMO | Admitting: Podiatry

## 2015-12-23 ENCOUNTER — Encounter: Payer: Self-pay | Admitting: Podiatry

## 2015-12-23 DIAGNOSIS — L97511 Non-pressure chronic ulcer of other part of right foot limited to breakdown of skin: Secondary | ICD-10-CM | POA: Diagnosis not present

## 2015-12-23 DIAGNOSIS — E1149 Type 2 diabetes mellitus with other diabetic neurological complication: Secondary | ICD-10-CM | POA: Diagnosis not present

## 2015-12-27 NOTE — Progress Notes (Signed)
Subjective: 70 year old male presents to the also discussed biopsy results. He states that the area on the right hallux has healed. Denies any redness or drainage increased swelling to his feet. His right fifth toe he did scrape the toe to this area has been healing. Denies any systemic complaints such as fevers, chills, nausea, vomiting. No acute changes since last appointment, and no other complaints at this time.   Objective: AAO x3, NAD DP/PT pulses palpable bilaterally, CRT less than 3 seconds Protective sensation decreased with Simms Weinstein monofilament Lesion on the right hallux has healed. There is no edema, erythema. Small superficial branch of the right fifth toe which is healing. No surrounding edema, erythema, drainage or pus or signs of infection. There is no other open lesions or pre-ulcerative lesions identified today.  No pain with calf compression, swelling, warmth, erythema  Assessment: Right foot benign acral melanosis  Plan: -All treatment options discussed with the patient including all alternatives, risks, complications.  -Biopsy results were discussed with the patient. -Recommend antibiotic ointment and a dressing of the right fifth toe daily. If not healed in 2-3 weeks to call the office or sooner if needed. Monitor for infection. -Follow-up as scheduled for routine care or sooner if needed. -Patient encouraged to call the office with any questions, concerns, change in symptoms.   Celesta Gentile, DPM

## 2016-01-06 ENCOUNTER — Ambulatory Visit (INDEPENDENT_AMBULATORY_CARE_PROVIDER_SITE_OTHER): Payer: Medicare HMO | Admitting: Urology

## 2016-01-06 ENCOUNTER — Encounter: Payer: Self-pay | Admitting: Urology

## 2016-01-06 VITALS — BP 162/67 | HR 61 | Ht 70.0 in | Wt 208.7 lb

## 2016-01-06 DIAGNOSIS — N186 End stage renal disease: Secondary | ICD-10-CM

## 2016-01-06 DIAGNOSIS — Z8551 Personal history of malignant neoplasm of bladder: Secondary | ICD-10-CM | POA: Diagnosis not present

## 2016-01-06 DIAGNOSIS — N281 Cyst of kidney, acquired: Secondary | ICD-10-CM | POA: Diagnosis not present

## 2016-01-06 LAB — URINALYSIS, COMPLETE
Bilirubin, UA: NEGATIVE
GLUCOSE, UA: NEGATIVE
Ketones, UA: NEGATIVE
Leukocytes, UA: NEGATIVE
NITRITE UA: NEGATIVE
PH UA: 7 (ref 5.0–7.5)
Specific Gravity, UA: 1.02 (ref 1.005–1.030)
UUROB: 0.2 mg/dL (ref 0.2–1.0)

## 2016-01-06 LAB — MICROSCOPIC EXAMINATION: BACTERIA UA: NONE SEEN

## 2016-01-06 MED ORDER — LIDOCAINE HCL 2 % EX GEL
1.0000 "application " | Freq: Once | CUTANEOUS | Status: AC
  Administered 2016-01-06: 1 via URETHRAL

## 2016-01-06 MED ORDER — CIPROFLOXACIN HCL 500 MG PO TABS
500.0000 mg | ORAL_TABLET | Freq: Once | ORAL | Status: AC
  Administered 2016-01-06: 500 mg via ORAL

## 2016-01-06 NOTE — Progress Notes (Signed)
01/06/2016 3:05 PM   Mark J Surace Jr. 1945-12-06 [id number]  Referring provider: Franciso Bend, MD [facility] [address] [address] [place], [place] [address]  Chief Complaint  Patient presents with  . Cysto    HPI: 1 - Bladder Cancer -  2015 - high-grade superficial bladder cancer s/p TURBT and induction BCG, compliant with cysto surveillance:  Recent sumarized course: 01/2015 - cysto NED 07/2015 - cysto NED  2 - End Stage Renal Disease - on IHD via left arm AVF MWF for ESRD. Still makes some urine voids abtou once per day.  3 - Right Non-Complex Renal Cyst - incidental Rt cyst by CT 2017, no complex features or mass effect.  Today "Mark [name]" is seen for cysto surveillance of bladder cancer No interval gross hematuria or dialysis issues.      PMH: Past Medical History:  Diagnosis Date  . Arthritis   . Bladder cancer ([facility])   . Chronic kidney disease   . Diabetes mellitus without complication ([facility])   . Hypertension   . Lymphoma ([facility]) 09/27/2014    Surgical History: Past Surgical History:  Procedure Laterality Date  . BACK SURGERY  2007  . CIRCUMCISION    . PERIPHERAL VASCULAR CATHETERIZATION Left 08/19/2015   Procedure: A/V Shuntogram/Fistulagram;  Surgeon: Algernon Huxley, MD;  Location: [facility];  Service: Cardiovascular;  Laterality: Left;  . PERIPHERAL VASCULAR CATHETERIZATION N/A 08/19/2015   Procedure: A/V Shunt Intervention;  Surgeon: Algernon Huxley, MD;  Location: [facility];  Service: Cardiovascular;  Laterality: N/A;    Home Medications:    Medication List       Accurate as of 01/06/16  3:05 PM. Always use your most recent med list.          Acetaminophen 500 MG coapsule   albuterol 108 (90 Base) MCG/ACT inhaler Commonly known as:  PROVENTIL HFA;VENTOLIN HFA Inhale 2 puffs into the lungs every 4 (four) hours as needed.   ALCOHOL PREP PADS EX   amLODipine 10 MG tablet Commonly known as:  NORVASC 10 mg  daily.   aspirin 325 MG tablet Take 325 mg by mouth daily.   calcitRIOL 0.25 MCG capsule Commonly known as:  ROCALTROL Take by mouth daily.   CALCIUM ACETATE (PHOS BINDER) PO Take by mouth daily.   CALTRATE 600 PLUS-VIT D PO Take by mouth.   ENSURE PLUS Liqd Take by mouth.   fluticasone 50 MCG/ACT nasal spray Commonly known as:  FLONASE Place 1 spray into both nostrils daily.   furosemide 80 MG tablet Commonly known as:  LASIX Take 80 mg by mouth. 2 tablets in am 1 tablet in PM   gabapentin 100 MG capsule Commonly known as:  NEURONTIN Take 1 capsule (100 mg total) by mouth 3 (three) times daily.   glimepiride 4 MG tablet Commonly known as:  AMARYL 4 mg. 2 tablets at bedtime   HCA TRIPLE ANTIBIOTIC OINTMENT EX Use as needed   LEVEMIR FLEXTOUCH 100 UNIT/ML Pen Generic drug:  Insulin Detemir Inject 30 Units into the skin daily at 10 pm.   lovastatin 20 MG tablet Commonly known as:  MEVACOR Take 20 mg by mouth at bedtime.   metoCLOPramide 5 MG tablet Commonly known as:  REGLAN Take 5 mg by mouth 2 (two) times daily at 10 AM and 5 PM.   metoprolol 100 MG tablet Commonly known as:  LOPRESSOR Qam day not on dialysis, Qpm day of dialysis  multivitamin Tabs tablet Take 1 tablet by mouth daily.   nystatin-triamcinolone cream Commonly known as:  MYCOLOG II Apply 1 application topically 2 (two) times daily.   riTUXimab [id number] 10 MG/ML injection Commonly known as:  RITUXAN Inject into the vein.   silver sulfADIAZINE 1 % cream Commonly known as:  SILVADENE Apply 1 application topically daily.   ULTICARE SHORT PEN NEEDLES 31G X 8 MM Misc Generic drug:  Insulin Pen Needle       Allergies:  Allergies  Allergen Reactions  . Daypro [Oxaprozin] Swelling and Rash    Other reaction(s): Other (See Comments)    Family History: Family History  Problem Relation Age of Onset  . Kidney cancer Neg Hx   . Prostate cancer Neg Hx   . Kidney failure Neg Hx   .  Bladder Cancer Neg Hx     Social History:  reports that he quit smoking about 2 years ago. His smoking use included Cigarettes. He has never used smokeless tobacco. He reports that he does not drink alcohol or use drugs.  ROS:                                        Physical Exam: BP (!) 162/67   Pulse 61   Ht 5\' 10"  (1.778 m)   Wt 208 lb 11.2 oz (94.7 kg)   BMI 29.95 kg/m   Constitutional:  Alert and oriented, No acute distress. HEENT: [place] AT, moist mucus membranes.  Trachea midline, no masses. Cardiovascular: No clubbing, cyanosis, or edema. Respiratory: Normal respiratory effort, no increased work of breathing. GI: Abdomen is soft, nontender, nondistended, no abdominal masses GU: No CVA tenderness.  Skin: No rashes, bruises or suspicious lesions. Lymph: No cervical or inguinal adenopathy. Neurologic: Grossly intact, no focal deficits, moving all 4 extremities. Psychiatric: Normal mood and affect.  Laboratory Data: Lab Results  Component Value Date   WBC 4.3 09/09/2015   HGB 12.3 (L) 09/09/2015   HCT 35.7 (L) 09/09/2015   MCV 93.8 09/09/2015   PLT 139 (L) 09/09/2015    Lab Results  Component Value Date   CREATININE 5.99 (H) 09/09/2015    No results found for: PSA  No results found for: TESTOSTERONE  Lab Results  Component Value Date   HGBA1C 9.3 (H) 10/16/2013    Urinalysis    Component Value Date/Time   COLORURINE Yellow 10/20/2013 1835   APPEARANCEUR Clear 07/06/2015 1407   LABSPEC 1.014 10/20/2013 1835   PHURINE 6.0 10/20/2013 1835   GLUCOSEU Negative 07/06/2015 1407   GLUCOSEU 150 mg/dL 10/20/2013 1835   HGBUR Negative 10/20/2013 1835   BILIRUBINUR Negative 07/06/2015 1407   BILIRUBINUR Negative 10/20/2013 1835   KETONESUR Negative 10/20/2013 1835   PROTEINUR 3+ (A) 07/06/2015 1407   PROTEINUR 100 mg/dL 10/20/2013 1835   NITRITE Negative 07/06/2015 1407   NITRITE Negative 10/20/2013 1835   LEUKOCYTESUR Trace (A) 07/06/2015  1407   LEUKOCYTESUR 1+ 10/20/2013 1835     Cystoscopy Procedure Note  Patient identification was confirmed, informed consent was obtained, and patient was prepped using Betadine solution.  Lidocaine jelly was administered per urethral meatus.    Preoperative abx where received prior to procedure.     Pre-Procedure: - Inspection reveals a normal caliber ureteral meatus.  Procedure: The flexible cystoscope was introduced without difficulty - No urethral strictures/lesions are present. - Enlarged prostate  - Normal bladder neck -  Bilateral ureteral orifices identified - Bladder mucosa  reveals no ulcers, tumors, or lesions - No bladder stones - No trabeculation  Retroflexion shows no intravesical lobe   Post-Procedure: - Patient tolerated the procedure well    Assessment & Plan:    1. History of bladder cancer - no recurrence by cysto today. continue Q58mo surveillance until 2019, then q yearly.   2. End-stage renal disease on hemodialysis (HCC) - severe medical renal disease, doing well on IHD.   3. Renal cyst, right, on-complex - no indicaiton for further evaluation or surveillance, observe.   Return for cystoscopy.  Nickie Retort, MD  [facility] [address], [place], [place] [address] [phone]

## 2016-02-01 ENCOUNTER — Ambulatory Visit (INDEPENDENT_AMBULATORY_CARE_PROVIDER_SITE_OTHER): Payer: Self-pay | Admitting: Vascular Surgery

## 2016-02-01 ENCOUNTER — Encounter (INDEPENDENT_AMBULATORY_CARE_PROVIDER_SITE_OTHER): Payer: Self-pay

## 2016-02-18 ENCOUNTER — Telehealth: Payer: Self-pay | Admitting: *Deleted

## 2016-02-18 MED ORDER — GABAPENTIN 100 MG PO CAPS: 100.0000 mg | ORAL_CAPSULE | Freq: Three times a day (TID) | ORAL | 0 refills | Status: DC

## 2016-02-18 NOTE — Telephone Encounter (Signed)
Received refill request for Gabapentin 100mg  one capsule tid. Dr. Berton Lan refill, pt needs appt prior to future refills. Return fax with note to refill once and pt needs a follow up appt.

## 2016-03-02 ENCOUNTER — Ambulatory Visit (INDEPENDENT_AMBULATORY_CARE_PROVIDER_SITE_OTHER): Payer: Medicare HMO | Admitting: Podiatry

## 2016-03-02 ENCOUNTER — Ambulatory Visit: Payer: Medicare HMO | Admitting: Podiatry

## 2016-03-02 ENCOUNTER — Encounter: Payer: Self-pay | Admitting: Podiatry

## 2016-03-02 DIAGNOSIS — M79609 Pain in unspecified limb: Secondary | ICD-10-CM | POA: Diagnosis not present

## 2016-03-02 DIAGNOSIS — L608 Other nail disorders: Secondary | ICD-10-CM

## 2016-03-02 DIAGNOSIS — B351 Tinea unguium: Secondary | ICD-10-CM

## 2016-03-02 DIAGNOSIS — L603 Nail dystrophy: Secondary | ICD-10-CM

## 2016-03-02 DIAGNOSIS — E0843 Diabetes mellitus due to underlying condition with diabetic autonomic (poly)neuropathy: Secondary | ICD-10-CM

## 2016-03-02 MED ORDER — GABAPENTIN 100 MG PO CAPS: 100.0000 mg | ORAL_CAPSULE | Freq: Three times a day (TID) | ORAL | 0 refills | Status: AC

## 2016-03-03 ENCOUNTER — Ambulatory Visit: Payer: Medicare HMO | Admitting: Podiatry

## 2016-03-05 NOTE — Progress Notes (Signed)
SUBJECTIVE Patient with a history of diabetes mellitus presents to office today complaining of elongated, thickened nails. Pain while ambulating in shoes. Patient is unable to trim their own nails.   Allergies  Allergen Reactions  . Daypro [Oxaprozin] Swelling and Rash    Other reaction(s): Other (See Comments)    OBJECTIVE General Patient is awake, alert, and oriented x 3 and in no acute distress. Derm Skin is dry and supple bilateral. Negative open lesions or macerations. Remaining integument unremarkable. Nails are tender, long, thickened and dystrophic with subungual debris, consistent with onychomycosis, 1-5 bilateral. No signs of infection noted. Vasc  DP and PT pedal pulses palpable bilaterally. Temperature gradient within normal limits.  Neuro Epicritic and protective threshold sensation diminished bilaterally.  Musculoskeletal Exam No symptomatic pedal deformities noted bilateral. Muscular strength within normal limits.  ASSESSMENT 1. Diabetes Mellitus w/ peripheral neuropathy 2. Onychomycosis of nail due to dermatophyte bilateral 3. Pain in foot bilateral  PLAN OF CARE 1. Patient evaluated today. 2. Instructed to maintain good pedal hygiene and foot care. Stressed importance of controlling blood sugar.  3. Mechanical debridement of nails 1-5 bilaterally performed using a nail nipper. Filed with dremel without incident.  4. Return to clinic in 3 mos.    Edrick Kins, DPM

## 2016-03-06 ENCOUNTER — Ambulatory Visit: Payer: Medicare HMO

## 2016-03-07 ENCOUNTER — Ambulatory Visit
Admission: RE | Admit: 2016-03-07 | Discharge: 2016-03-07 | Disposition: A | Discharge status: Home / Self Care | Payer: Medicare HMO | Source: Ambulatory Visit | Attending: Internal Medicine | Admitting: Internal Medicine

## 2016-03-07 DIAGNOSIS — R590 Localized enlarged lymph nodes: Secondary | ICD-10-CM | POA: Insufficient documentation

## 2016-03-07 DIAGNOSIS — N200 Calculus of kidney: Secondary | ICD-10-CM | POA: Diagnosis not present

## 2016-03-07 DIAGNOSIS — I2510 Atherosclerotic heart disease of native coronary artery without angina pectoris: Secondary | ICD-10-CM | POA: Insufficient documentation

## 2016-03-07 DIAGNOSIS — R911 Solitary pulmonary nodule: Secondary | ICD-10-CM | POA: Diagnosis present

## 2016-03-07 DIAGNOSIS — C9110 Chronic lymphocytic leukemia of B-cell type not having achieved remission: Secondary | ICD-10-CM | POA: Diagnosis present

## 2016-03-09 ENCOUNTER — Inpatient Hospital Stay: Payer: Medicare HMO | Attending: Internal Medicine | Admitting: Internal Medicine

## 2016-03-09 ENCOUNTER — Other Ambulatory Visit: Payer: Self-pay

## 2016-03-09 ENCOUNTER — Inpatient Hospital Stay (HOSPITAL_BASED_OUTPATIENT_CLINIC_OR_DEPARTMENT_OTHER): Payer: Medicare HMO

## 2016-03-09 DIAGNOSIS — D631 Anemia in chronic kidney disease: Secondary | ICD-10-CM | POA: Diagnosis not present

## 2016-03-09 DIAGNOSIS — D696 Thrombocytopenia, unspecified: Secondary | ICD-10-CM | POA: Insufficient documentation

## 2016-03-09 DIAGNOSIS — N189 Chronic kidney disease, unspecified: Secondary | ICD-10-CM | POA: Diagnosis not present

## 2016-03-09 DIAGNOSIS — Z79899 Other long term (current) drug therapy: Secondary | ICD-10-CM | POA: Insufficient documentation

## 2016-03-09 DIAGNOSIS — I120 Hypertensive chronic kidney disease with stage 5 chronic kidney disease or end stage renal disease: Secondary | ICD-10-CM | POA: Insufficient documentation

## 2016-03-09 DIAGNOSIS — C9110 Chronic lymphocytic leukemia of B-cell type not having achieved remission: Secondary | ICD-10-CM | POA: Diagnosis not present

## 2016-03-09 DIAGNOSIS — R911 Solitary pulmonary nodule: Secondary | ICD-10-CM | POA: Diagnosis not present

## 2016-03-09 DIAGNOSIS — M1990 Unspecified osteoarthritis, unspecified site: Secondary | ICD-10-CM | POA: Diagnosis not present

## 2016-03-09 DIAGNOSIS — Z794 Long term (current) use of insulin: Secondary | ICD-10-CM | POA: Insufficient documentation

## 2016-03-09 DIAGNOSIS — C8303 Small cell B-cell lymphoma, intra-abdominal lymph nodes: Secondary | ICD-10-CM

## 2016-03-09 DIAGNOSIS — N200 Calculus of kidney: Secondary | ICD-10-CM | POA: Diagnosis not present

## 2016-03-09 DIAGNOSIS — M21371 Foot drop, right foot: Secondary | ICD-10-CM | POA: Insufficient documentation

## 2016-03-09 DIAGNOSIS — I2510 Atherosclerotic heart disease of native coronary artery without angina pectoris: Secondary | ICD-10-CM | POA: Insufficient documentation

## 2016-03-09 DIAGNOSIS — Z992 Dependence on renal dialysis: Secondary | ICD-10-CM | POA: Insufficient documentation

## 2016-03-09 DIAGNOSIS — Z7982 Long term (current) use of aspirin: Secondary | ICD-10-CM | POA: Insufficient documentation

## 2016-03-09 DIAGNOSIS — E1122 Type 2 diabetes mellitus with diabetic chronic kidney disease: Secondary | ICD-10-CM | POA: Insufficient documentation

## 2016-03-09 DIAGNOSIS — Z87891 Personal history of nicotine dependence: Secondary | ICD-10-CM | POA: Insufficient documentation

## 2016-03-09 LAB — CBC WITH DIFFERENTIAL/PLATELET
Basophils Absolute: 0 K/uL (ref 0–0.1)
Basophils Relative: 1 %
Eosinophils Absolute: 0.2 K/uL (ref 0–0.7)
Eosinophils Relative: 4 %
HCT: 37.3 % — ABNORMAL LOW (ref 40.0–52.0)
Hemoglobin: 12.9 g/dL — ABNORMAL LOW (ref 13.0–18.0)
Lymphocytes Relative: 35 %
Lymphs Abs: 1.6 K/uL (ref 1.0–3.6)
MCH: 32.3 pg (ref 26.0–34.0)
MCHC: 34.7 g/dL (ref 32.0–36.0)
MCV: 93 fL (ref 80.0–100.0)
Monocytes Absolute: 0.3 K/uL (ref 0.2–1.0)
Monocytes Relative: 7 %
Neutro Abs: 2.4 K/uL (ref 1.4–6.5)
Neutrophils Relative %: 53 %
Platelets: 116 K/uL — ABNORMAL LOW (ref 150–440)
RBC: 4.01 MIL/uL — ABNORMAL LOW (ref 4.40–5.90)
RDW: 14.7 % — ABNORMAL HIGH (ref 11.5–14.5)
WBC: 4.6 K/uL (ref 3.8–10.6)

## 2016-03-09 LAB — COMPREHENSIVE METABOLIC PANEL WITH GFR
ALT: 28 U/L (ref 17–63)
AST: 22 U/L (ref 15–41)
Albumin: 4.4 g/dL (ref 3.5–5.0)
Alkaline Phosphatase: 57 U/L (ref 38–126)
Anion gap: 11 (ref 5–15)
BUN: 37 mg/dL — ABNORMAL HIGH (ref 6–20)
CO2: 29 mmol/L (ref 22–32)
Calcium: 8.7 mg/dL — ABNORMAL LOW (ref 8.9–10.3)
Chloride: 98 mmol/L — ABNORMAL LOW (ref 101–111)
Creatinine, Ser: 5.65 mg/dL — ABNORMAL HIGH (ref 0.61–1.24)
GFR calc Af Amer: 11 mL/min — ABNORMAL LOW
GFR calc non Af Amer: 9 mL/min — ABNORMAL LOW
Glucose, Bld: 126 mg/dL — ABNORMAL HIGH (ref 65–99)
Potassium: 4 mmol/L (ref 3.5–5.1)
Sodium: 138 mmol/L (ref 135–145)
Total Bilirubin: 0.5 mg/dL (ref 0.3–1.2)
Total Protein: 6.9 g/dL (ref 6.5–8.1)

## 2016-03-09 LAB — LACTATE DEHYDROGENASE: LDH: 148 U/L (ref 98–192)

## 2016-03-09 NOTE — Assessment & Plan Note (Signed)
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 Reviewed the most recent CT scan in [place] 2017-mild progression in mediastinum;  one of the right pelvic lymph node has stable around 1.5-2 cm. I reviewed the images myself reviewed with the patient and family.   # Patient is asymptomatic from his CLL/SLL at this time. Hb-12; platelets- 116. Continue monitoring for now.   # Mild thrombocytopenia platelets 116- reviewed at length with the patient's son that this could be a manifestation of CLL; however this does not merit any treatment at this time. Patient states that his labs checked on a monthly basis at dialysis. Monitor for now.   # superficial bladder cancer/ follow-up with urology.   # Follow up in 6 months/labs/ no scan.    CC: Dr.Vohra; nephrology.

## 2016-03-09 NOTE — Progress Notes (Signed)
Patient here today for follow up. Patient concerned about his platelet count

## 2016-03-09 NOTE — Progress Notes (Signed)
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 Bone marrow biopsy on 03/05/13 with mildly hypercellular marrow for age 70-50% with interstitial predominantly small B-lymphocytic infiltrate estimated about 20-30% of marrow cells, mild nonspecific dyserythropoiesis, storage iron present this. Flow cytometry showed 29% CD5+ clonal B-cell population which is CD45+, CD5+, CD19+, CD20+, CD22+, CD23+, CD38+, HLA-DR+, Slg kappa+, and is CD10-, CD11b-, FMC7-. 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 PET scan on 03/03/13 with extensive hypermetabolic lymphadenopathy.  (presented with progressive Thrombocytopenia with persistent Anemia. CBC on 01/16/13 showed hemoglobin 10.0, MCV 98, platelets 80, WBC 4890 with 40% neutrophils, 54% lymphocytes, 1.4% monocytes. On Epogen and Venofer at dialysis treatments. Further workup showed small M-spike of 0.2).   Got Treanda/Rituxan x2 cycles in Dec 2014/Jan 2015. Then on single agent Rituxan q 8 weeks (got #3 dose on 09/16/13).  # DEC 2014- SMALL CELL LYMPHOMA/CLL- STAGE IV [BMBx- 03/2013- 20-30% B cell infiltrate; CD 5+/CD 20+]; FISH- Trisomy 12;DEC 2014-Treanda-Rituxan x2 [held sec to prolonged hospital/pneumoani/HD]; Rituxa q8W [last June 2015; Held sec to Superficial Bladder ca]; CT- JUNE 2017- STABLE Mediastinal LN/supraclav LN/pelvic LN; CT DEC 2017- mild progression noted ~1.5-2cm mediastinal/Ax LN. Cont surveillance  # 51m RLL lung nodule- F/u in 6 M [Hx of smoking quit 2015]  # ESRD on HD [Dr.Voora]; DM; Non-healing ulcers in feet/Hx R foot drop- walker ambulation     Lymphoma ([place]   09/27/2014 Initial Diagnosis    Lymphoma (HCC)       Small B-cell lymphoma of intra-abdominal lymph nodes (HCC)     INTERVAL  HISTORY:  A very pleasant 70year old African-American male patient with above history of SLL/CLL  With ESRD on HD currently on surveillance is here for follow-up/to review the results of the CT scan. Denies any new lumps or bumps.  Patient denies any unusual weight loss or night sweats or new lumps or bumps.  Denies any weight loss. Denies any hospitalizations or infections.  Denies any shortness of breath or cough.  REVIEW OF SYSTEMS:  A complete 10 point review of system is done which is negative except mentioned above/history of present illness.   PAST MEDICAL HISTORY :  Past Medical History:  Diagnosis Date  . Arthritis   . Bladder cancer ([facility]   . Chronic kidney disease   . Diabetes mellitus without complication ([facility]   . Hypertension   . Lymphoma ([facility] 09/27/2014    PAST SURGICAL HISTORY :   Past Surgical History:  Procedure Laterality Date  . BACK SURGERY  2007  . CIRCUMCISION    . PERIPHERAL VASCULAR CATHETERIZATION Left 08/19/2015   Procedure: A/V Shuntogram/Fistulagram;  Surgeon: JAlgernon Huxley MD;  [facility];  Service: Cardiovascular;  Laterality: Left;  . PERIPHERAL VASCULAR CATHETERIZATION N/A 08/19/2015   Procedure: A/V Shunt Intervention;  Surgeon: JAlgernon Huxley MD;  [facility];  Service: Cardiovascular;  Laterality: N/A;    FAMILY HISTORY :   Family History  Problem Relation Age of Onset  . Kidney cancer Neg Hx   . Prostate cancer Neg Hx   . Kidney failure Neg Hx   . Bladder Cancer Neg Hx     SOCIAL HISTORY:   Social History  Substance Use Topics  . Smoking status: Former Smoker    Types: Cigarettes  Quit date: 05/19/2013  . Smokeless tobacco: Never Used     Comment: quit  . Alcohol use No    ALLERGIES:  is allergic to daypro [oxaprozin].  MEDICATIONS:  Current Outpatient Prescriptions  Medication Sig Dispense Refill  . Acetaminophen 500 MG coapsule Take 500 mg by mouth every 6 (six) hours as needed.     [name]  amLODipine (NORVASC) 10 MG tablet Take 10 mg by mouth daily.     [name] aspirin 325 MG tablet Take 325 mg by mouth daily.    . calcitRIOL (ROCALTROL) 0.25 MCG capsule Take by mouth daily.     [name] CALCIUM ACETATE, PHOS BINDER, PO Take by mouth daily.     . Calcium-Vitamin D (CALTRATE 600 PLUS-VIT D PO) Take by mouth.    . ENSURE PLUS (ENSURE PLUS) LIQD Take by mouth.    . fluticasone (FLONASE) 50 MCG/ACT nasal spray Place 1 spray into both nostrils daily.     . furosemide (LASIX) 80 MG tablet Take 80 mg by mouth. 2 tablets in am 1 tablet in PM    . gabapentin (NEURONTIN) 100 MG capsule Take 1 capsule (100 mg total) by mouth 3 (three) times daily. 90 capsule 0  . glimepiride (AMARYL) 4 MG tablet 4 mg. 2 tablets at bedtime    . Isopropyl Alcohol (ALCOHOL PREP PADS EX)     . LEVEMIR FLEXTOUCH 100 UNIT/ML Pen Inject 30 Units into the skin daily at 10 pm.     . lovastatin (MEVACOR) 20 MG tablet Take 20 mg by mouth at bedtime.    . metoCLOPramide (REGLAN) 5 MG tablet Take 5 mg by mouth 2 (two) times daily at 10 AM and 5 PM.     . metoprolol (LOPRESSOR) 100 MG tablet Qam day not on dialysis, Qpm day of dialysis    . multivitamin (RENA-VIT) TABS tablet Take 1 tablet by mouth daily.    . riTUXimab U8891 (RITUXAN) 10 MG/ML injection Inject into the vein.    [name] ULTICARE SHORT PEN NEEDLES 31G X 8 MM MISC     . albuterol (PROVENTIL HFA;VENTOLIN HFA) 108 (90 BASE) MCG/ACT inhaler Inhale 2 puffs into the lungs every 4 (four) hours as needed.     . nystatin-triamcinolone (MYCOLOG II) cream Apply 1 application topically 2 (two) times daily.    . silver sulfADIAZINE (SILVADENE) 1 % cream Apply 1 application topically daily. (Patient not taking: Reported on 03/09/2016) 50 g 1   No current facility-administered medications for this visit.     PHYSICAL EXAMINATION: ECOG PERFORMANCE STATUS: 3  BP (!) 163/71 (BP Location: Right Arm, Patient Position: Sitting)   Pulse 66   Temp (!) 95.7 F (35.4 C) (Tympanic)   Wt  208 lb (94.3 kg)   BMI 29.84 kg/m   Filed Weights   03/09/16 1057  Weight: 208 lb (94.3 kg)    GENERAL: Well-nourished well-developed; Alert, no distress and comfortable.  Accompanied by son; pt is ina wheel chair. EYES: no pallor or icterus OROPHARYNX: no thrush or ulceration NECK: supple, no masses felt LYMPH:  no palpable lymphadenopathy in the cervical, axillary or inguinal regions LUNGS: clear to auscultation and  No wheeze or crackles HEART/CVS: regular rate & rhythm and no murmurs; No lower extremity edema;  R-LE in brace.  ABDOMEN:abdomen soft, non-tender and normal bowel sounds Musculoskeletal:no cyanosis of digits and no clubbing  PSYCH: alert & oriented x 3 with fluent speech NEURO: no focal motor/sensory deficits SKIN:  no rashes or significant lesions  LABORATORY DATA:  I have reviewed the data as listed    Component Value Date/Time   NA 138 03/09/2016 1017   NA 140 11/18/2013 1030   K 4.0 03/09/2016 1017   K 4.0 11/18/2013 1030   CL 98 (L) 03/09/2016 1017   CL 97 (L) 11/18/2013 1030   CO2 29 03/09/2016 1017   CO2 33 (H) 11/18/2013 1030   GLUCOSE 126 (H) 03/09/2016 1017   GLUCOSE 265 (H) 11/18/2013 1030   BUN 37 (H) 03/09/2016 1017   BUN 30 (H) 11/18/2013 1030   CREATININE 5.65 (H) 03/09/2016 1017   CREATININE 5.05 (H) 11/18/2013 1030   CALCIUM 8.7 (L) 03/09/2016 1017   CALCIUM 8.6 11/18/2013 1030   PROT 6.9 03/09/2016 1017   PROT 7.0 11/18/2013 1030   ALBUMIN 4.4 03/09/2016 1017   ALBUMIN 3.7 11/18/2013 1030   AST 22 03/09/2016 1017   AST 15 11/18/2013 1030   ALT 28 03/09/2016 1017   ALT 25 11/18/2013 1030   ALKPHOS 57 03/09/2016 1017   ALKPHOS 105 11/18/2013 1030   BILITOT 0.5 03/09/2016 1017   BILITOT 0.3 11/18/2013 1030   GFRNONAA 9 (L) 03/09/2016 1017   GFRNONAA 11 (L) 11/18/2013 1030   GFRAA 11 (L) 03/09/2016 1017   GFRAA 13 (L) 11/18/2013 1030    No results found for: SPEP, UPEP  Lab Results  Component Value Date   WBC 4.6 03/09/2016    NEUTROABS 2.4 03/09/2016   HGB 12.9 (L) 03/09/2016   HCT 37.3 (L) 03/09/2016   MCV 93.0 03/09/2016   PLT 116 (L) 03/09/2016      Chemistry      Component Value Date/Time   NA 138 03/09/2016 1017   NA 140 11/18/2013 1030   K 4.0 03/09/2016 1017   K 4.0 11/18/2013 1030   CL 98 (L) 03/09/2016 1017   CL 97 (L) 11/18/2013 1030   CO2 29 03/09/2016 1017   CO2 33 (H) 11/18/2013 1030   BUN 37 (H) 03/09/2016 1017   BUN 30 (H) 11/18/2013 1030   CREATININE 5.65 (H) 03/09/2016 1017   CREATININE 5.05 (H) 11/18/2013 1030      Component Value Date/Time   CALCIUM 8.7 (L) 03/09/2016 1017   CALCIUM 8.6 11/18/2013 1030   ALKPHOS 57 03/09/2016 1017   ALKPHOS 105 11/18/2013 1030   AST 22 03/09/2016 1017   AST 15 11/18/2013 1030   ALT 28 03/09/2016 1017   ALT 25 11/18/2013 1030   BILITOT 0.5 03/09/2016 1017   BILITOT 0.3 11/18/2013 1030     IMPRESSION: Slight progression of mediastinal lymphadenopathy compared to previous studies dating back to 05/13/2015. Stable mild bilateral axillary lymphadenopathy.  Resolution of right middle lobe ground-glass opacity, consistent with resolving inflammatory or infectious process. No active lung disease.  Incidental findings including aortic and coronary artery atherosclerosis and nonobstructive left renal calculus.  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 Reviewed the most recent CT scan in [place] 2017-mild progression in mediastinum;  one of the right pelvic lymph node has stable around 1.5-2 cm. I reviewed the images myself reviewed with the patient and family.   # Patient is asymptomatic from his CLL/SLL at this time. Hb-12; platelets- 116. Continue monitoring for now.   # Mild thrombocytopenia platelets 116- reviewed at length with the patient's son that this could be a manifestation of CLL; however this does not merit any  treatment at  this time. Patient states that his labs checked on a monthly basis at dialysis. Monitor for now.   # superficial bladder cancer/ follow-up with urology.   # Follow up in 6 months/labs/ no scan.    CC: Dr.Vohra; nephrology.        Orders Placed This Encounter  Procedures  . CBC with Differential    Standing Status:   Future    Standing Expiration Date:   03/09/2017  . Comprehensive metabolic panel    Standing Status:   Future    Standing Expiration Date:   03/09/2017       Cammie Sickle, MD 03/09/2016 1:36 PM

## 2016-03-23 ENCOUNTER — Encounter
Admission: RE | Disposition: A | Discharge status: Home / Self Care | Payer: Self-pay | Source: Ambulatory Visit | Attending: Ophthalmology

## 2016-03-23 ENCOUNTER — Ambulatory Visit
Admission: RE | Admit: 2016-03-23 | Discharge: 2016-03-23 | Disposition: A | Discharge status: Home / Self Care | Payer: Medicare HMO | Source: Ambulatory Visit | Attending: Ophthalmology | Admitting: Ophthalmology

## 2016-03-23 ENCOUNTER — Ambulatory Visit: Payer: Medicare HMO | Admitting: Anesthesiology

## 2016-03-23 ENCOUNTER — Encounter: Payer: Self-pay | Admitting: Anesthesiology

## 2016-03-23 DIAGNOSIS — H5703 Miosis: Secondary | ICD-10-CM | POA: Diagnosis not present

## 2016-03-23 DIAGNOSIS — G709 Myoneural disorder, unspecified: Secondary | ICD-10-CM | POA: Diagnosis not present

## 2016-03-23 DIAGNOSIS — M1990 Unspecified osteoarthritis, unspecified site: Secondary | ICD-10-CM | POA: Insufficient documentation

## 2016-03-23 DIAGNOSIS — I10 Essential (primary) hypertension: Secondary | ICD-10-CM | POA: Diagnosis not present

## 2016-03-23 DIAGNOSIS — Z7984 Long term (current) use of oral hypoglycemic drugs: Secondary | ICD-10-CM | POA: Insufficient documentation

## 2016-03-23 DIAGNOSIS — Z87891 Personal history of nicotine dependence: Secondary | ICD-10-CM | POA: Diagnosis not present

## 2016-03-23 DIAGNOSIS — E1136 Type 2 diabetes mellitus with diabetic cataract: Secondary | ICD-10-CM | POA: Insufficient documentation

## 2016-03-23 DIAGNOSIS — Z79899 Other long term (current) drug therapy: Secondary | ICD-10-CM | POA: Insufficient documentation

## 2016-03-23 DIAGNOSIS — D649 Anemia, unspecified: Secondary | ICD-10-CM | POA: Insufficient documentation

## 2016-03-23 HISTORY — PX: CATARACT EXTRACTION W/PHACO: SHX586

## 2016-03-23 LAB — POCT I-STAT 4, (NA,K, GLUC, HGB,HCT)
Glucose, Bld: 166 mg/dL — ABNORMAL HIGH (ref 65–99)
HCT: 37 % — ABNORMAL LOW (ref 39.0–52.0)
HEMOGLOBIN: 12.6 g/dL — AB (ref 13.0–17.0)
Potassium: 4.4 mmol/L (ref 3.5–5.1)
SODIUM: 139 mmol/L (ref 135–145)

## 2016-03-23 LAB — GLUCOSE, CAPILLARY: Glucose-Capillary: 143 mg/dL — ABNORMAL HIGH (ref 65–99)

## 2016-03-23 SURGERY — PHACOEMULSIFICATION, CATARACT, WITH IOL INSERTION
Anesthesia: Monitor Anesthesia Care | Site: Eye | Laterality: Left | Wound class: Clean

## 2016-03-23 MED ORDER — MIDAZOLAM HCL 2 MG/2ML IJ SOLN
INTRAMUSCULAR | Status: AC
  Filled 2016-03-23: qty 2

## 2016-03-23 MED ORDER — NEOMYCIN-POLYMYXIN-DEXAMETH 0.1 % OP OINT
TOPICAL_OINTMENT | OPHTHALMIC | Status: DC | PRN
  Administered 2016-03-23: 1 via OPHTHALMIC

## 2016-03-23 MED ORDER — FENTANYL CITRATE (PF) 100 MCG/2ML IJ SOLN
INTRAMUSCULAR | Status: AC
  Filled 2016-03-23: qty 2

## 2016-03-23 MED ORDER — MOXIFLOXACIN HCL 0.5 % OP SOLN
OPHTHALMIC | Status: AC
  Administered 2016-03-23: 1 [drp] via OPHTHALMIC
  Filled 2016-03-23: qty 3

## 2016-03-23 MED ORDER — NA HYALUR & NA CHOND-NA HYALUR 0.4-0.35 ML IO KIT
PACK | INTRAOCULAR | Status: DC | PRN
  Administered 2016-03-23: .35 mL via INTRAOCULAR

## 2016-03-23 MED ORDER — SODIUM CHLORIDE 0.9 % IV SOLN
INTRAVENOUS | Status: DC
  Administered 2016-03-23: 09:00:00 via INTRAVENOUS

## 2016-03-23 MED ORDER — LIDOCAINE HCL (PF) 4 % IJ SOLN
INTRAOCULAR | Status: DC | PRN
  Administered 2016-03-23: 4 mL via OPHTHALMIC

## 2016-03-23 MED ORDER — MOXIFLOXACIN HCL 0.5 % OP SOLN: 1.0000 [drp] | OPHTHALMIC | Status: AC

## 2016-03-23 MED ORDER — CARBACHOL 0.01 % IO SOLN
INTRAOCULAR | Status: DC | PRN
  Administered 2016-03-23: 0.5 mL via INTRAOCULAR

## 2016-03-23 MED ORDER — FENTANYL CITRATE (PF) 100 MCG/2ML IJ SOLN
INTRAMUSCULAR | Status: DC | PRN
  Administered 2016-03-23: 25 ug via INTRAVENOUS

## 2016-03-23 MED ORDER — ARMC OPHTHALMIC DILATING DROPS
OPHTHALMIC | Status: AC
  Administered 2016-03-23 (×3)
  Filled 2016-03-23: qty 0.4

## 2016-03-23 MED ORDER — MOXIFLOXACIN HCL 0.5 % OP SOLN
1.0000 [drp] | OPHTHALMIC | Status: AC
  Administered 2016-03-23 (×3): 1 [drp] via OPHTHALMIC

## 2016-03-23 MED ORDER — EPINEPHRINE PF 1 MG/ML IJ SOLN
INTRAMUSCULAR | Status: DC | PRN
  Administered 2016-03-23: 09:00:00 via OPHTHALMIC

## 2016-03-23 MED ORDER — MIDAZOLAM HCL 2 MG/2ML IJ SOLN
INTRAMUSCULAR | Status: DC | PRN
  Administered 2016-03-23: 1 mg via INTRAVENOUS

## 2016-03-23 SURGICAL SUPPLY — 22 items
CANNULA ANT/CHMB 27GA (MISCELLANEOUS) ×3 IMPLANT
CUP MEDICINE 2OZ PLAST GRAD ST (MISCELLANEOUS) ×3 IMPLANT
GLOVE BIO SURGEON STRL SZ8 (GLOVE) ×3 IMPLANT
GLOVE BIOGEL M 6.5 STRL (GLOVE) ×3 IMPLANT
GLOVE SURG LX 7.5 STRW (GLOVE) ×2
GLOVE SURG LX STRL 7.5 STRW (GLOVE) ×1 IMPLANT
GOWN STRL REUS W/ TWL LRG LVL3 (GOWN DISPOSABLE) ×2 IMPLANT
GOWN STRL REUS W/TWL LRG LVL3 (GOWN DISPOSABLE) ×4
LENS IOL TECNIS ITEC 19.5 (Intraocular Lens) ×3 IMPLANT
PACK CATARACT (MISCELLANEOUS) ×3 IMPLANT
PACK CATARACT BRASINGTON LX (MISCELLANEOUS) ×3 IMPLANT
PACK EYE AFTER SURG (MISCELLANEOUS) ×3 IMPLANT
RING MALYGIN (MISCELLANEOUS) ×3 IMPLANT
SOL BSS BAG (MISCELLANEOUS) ×3
SOL PREP PVP 2OZ (MISCELLANEOUS) ×3
SOLUTION BSS BAG (MISCELLANEOUS) ×1 IMPLANT
SOLUTION PREP PVP 2OZ (MISCELLANEOUS) ×1 IMPLANT
SYR 3ML LL SCALE MARK (SYRINGE) ×3 IMPLANT
SYR 5ML LL (SYRINGE) ×3 IMPLANT
SYR TB 1ML 27GX1/2 LL (SYRINGE) ×3 IMPLANT
WATER STERILE IRR 250ML POUR (IV SOLUTION) ×3 IMPLANT
WIPE NON LINTING 3.25X3.25 (MISCELLANEOUS) ×3 IMPLANT

## 2016-03-23 NOTE — Anesthesia Preprocedure Evaluation (Signed)
Anesthesia Evaluation  Patient identified by MRN, date of birth, ID band Patient awake    Reviewed: Allergy & Precautions, NPO status , Patient's Chart, lab work & pertinent test results  Airway Mallampati: III       Dental  (+) Edentulous Lower, Edentulous Upper   Pulmonary former smoker,    Pulmonary exam normal        Cardiovascular hypertension, Pt. on medications Normal cardiovascular exam     Neuro/Psych  Neuromuscular disease negative psych ROS   GI/Hepatic   Endo/Other  diabetes, Well Controlled, Type 2, Oral Hypoglycemic Agents  Renal/GU ESRF and DialysisRenal disease  negative genitourinary   Musculoskeletal  (+) Arthritis , Osteoarthritis,    Abdominal Normal abdominal exam  (+)   Peds negative pediatric ROS (+)  Hematology  (+) anemia ,   Anesthesia Other Findings   Reproductive/Obstetrics                             Anesthesia Physical Anesthesia Plan  ASA: III  Anesthesia Plan: MAC   Post-op Pain Management:    Induction: Intravenous  Airway Management Planned: Nasal Cannula  Additional Equipment:   Intra-op Plan:   Post-operative Plan:   Informed Consent: I have reviewed the patients History and Physical, chart, labs and discussed the procedure including the risks, benefits and alternatives for the proposed anesthesia with the patient or authorized representative who has indicated his/her understanding and acceptance.   Dental advisory given  Plan Discussed with: CRNA and Surgeon  Anesthesia Plan Comments:         Anesthesia Quick Evaluation

## 2016-03-23 NOTE — Anesthesia Procedure Notes (Signed)
Procedure Name: MAC Date/Time: 03/23/2016 9:20 AM Performed by: Johnna Acosta Pre-anesthesia Checklist: Patient identified, Emergency Drugs available, Suction available, Patient being monitored and Timeout performed Patient Re-evaluated:Patient Re-evaluated prior to induction

## 2016-03-23 NOTE — Anesthesia Postprocedure Evaluation (Signed)
Anesthesia Post Note  Patient: Mark [name].  Procedure(s) Performed: Procedure(s) (LRB): CATARACT EXTRACTION PHACO AND INTRAOCULAR LENS PLACEMENT (IOC) (Left)  Patient location during evaluation: Short Stay Anesthesia Type: MAC Level of consciousness: awake Pain management: pain level controlled Vital Signs Assessment: post-procedure vital signs reviewed and stable Respiratory status: spontaneous breathing Cardiovascular status: blood pressure returned to baseline Postop Assessment: no headache Anesthetic complications: no     Last Vitals:  Vitals:   03/23/16 0805 03/23/16 0809  BP:  (!) 139/52  Pulse: (!) 56   Resp: 20   Temp: (!) 35.8 C     Last Pain:  Vitals:   03/23/16 0805  TempSrc: Tympanic  PainSc: 0-No pain                 Buckner [place]

## 2016-03-23 NOTE — H&P (Signed)
The History and Physical notes are on paper, have been signed, and are to be scanned. The patient remains stable and unchanged from the H&P.   Previous H&P reviewed, patient examined, and there are no changes.  Mark [name] 03/23/2016 8:37 AM

## 2016-03-23 NOTE — Transfer of Care (Signed)
Immediate Anesthesia Transfer of Care Note  Patient: Mark [name].  Procedure(s) Performed: Procedure(s) with comments: CATARACT EXTRACTION PHACO AND INTRAOCULAR LENS PLACEMENT (IOC) (Left) - [place] 52.6 AP% 14.7 CDE 7.72 Fluid pack lot # [id number]  Patient Location:  120/52 55 hr 16 resp 97.2 @ 0941  Anesthesia Type:MAC  Level of Consciousness: awake  Airway & Oxygen Therapy: Patient Spontanous Breathing  Post-op Assessment: Report given to RN and Post -op Vital signs reviewed and stable  Post vital signs: Reviewed and stable  Last Vitals:  Vitals:   03/23/16 0805 03/23/16 0809  BP:  (!) 139/52  Pulse: (!) 56   Resp: 20   Temp: (!) 35.8 C     Last Pain:  Vitals:   03/23/16 0805  TempSrc: Tympanic  PainSc: 0-No pain         Complications: No apparent anesthesia complications

## 2016-03-23 NOTE — Discharge Instructions (Signed)
Eye Surgery Discharge Instructions  Expect mild scratchy sensation or mild soreness. DO NOT RUB YOUR EYE!  The day of surgery:  Minimal physical activity, but bed rest is not required  No reading, computer work, or close hand work  No bending, lifting, or straining.  May watch TV  For 24 hours:  No driving, legal decisions, or alcoholic beverages  Safety precautions  Eat anything you prefer: It is better to start with liquids, then soup then solid foods.  _____ Eye patch should be worn until postoperative exam tomorrow.  ____ Solar shield eyeglasses should be worn for comfort in the sunlight/patch while sleeping  Resume all regular medications including aspirin or Coumadin if these were discontinued prior to surgery. You may shower, bathe, shave, or wash your hair. Tylenol may be taken for mild discomfort.  Call your doctor if you experience significant pain, nausea, or vomiting, fever > 101 or other signs of infection. [phone] or [phone] Specific instructions:  Follow-up Information    Leandrew Koyanagi, MD Follow up.   Specialty:  Ophthalmology Why:  03/24/16 at 10:00 Contact information: [place] [address] [phone]          Eye Surgery Discharge Instructions  Expect mild scratchy sensation or mild soreness. DO NOT RUB YOUR EYE!  The day of surgery:  Minimal physical activity, but bed rest is not required  No reading, computer work, or close hand work  No bending, lifting, or straining.  May watch TV  For 24 hours:  No driving, legal decisions, or alcoholic beverages  Safety precautions  Eat anything you prefer: It is better to start with liquids, then soup then solid foods.  _____ Eye patch should be worn until postoperative exam tomorrow.  ____ Solar shield eyeglasses should be worn for comfort in the sunlight/patch while sleeping  Resume all regular medications including aspirin or Coumadin if these  were discontinued prior to surgery. You may shower, bathe, shave, or wash your hair. Tylenol may be taken for mild discomfort.  Call your doctor if you experience significant pain, nausea, or vomiting, fever > 101 or other signs of infection. [phone] or [phone] Specific instructions:  Follow-up Information    Leandrew Koyanagi, MD Follow up.   Specialty:  Ophthalmology Why:  03/24/16 at 10:00 Contact information: [address]   [place] [address] [phone]

## 2016-03-23 NOTE — Op Note (Signed)
OPERATIVE NOTE  Mark [name] [id number] 03/23/2016  PREOPERATIVE DIAGNOSIS:   Nuclear sclerotic cataract left eye with miotic pupil      H25.12   POSTOPERATIVE DIAGNOSIS:   Nuclear sclerotic cataract left eye with miotic pupil.     PROCEDURE:  Phacoemulsification with posterior chamber intraocular lens implantation of the left eye which required pupil stretching with the Malyugin pupil expansion device   LENS:   Implant Name Type Inv. Item Serial No. Manufacturer Lot No. LRB No. Used  LENS IOL DIOP 19.5 - [id number] Intraocular Lens LENS IOL DIOP 19.5 [id number] AMO   Left 1        ULTRASOUND TIME: 15 % of 0 minutes, 53 seconds.  CDE 7.7   SURGEON:  Wyonia Hough, MD   ANESTHESIA: Topical with tetracaine drops and 2% Xylocaine jelly, augmented with 1% preservative-free intracameral lidocaine.   COMPLICATIONS:  None.   DESCRIPTION OF PROCEDURE:  The patient was identified in the holding room and transported to the operating room and placed in the supine position under the operating microscope.  The left eye was identified as the operative eye and it was prepped and draped in the usual sterile ophthalmic fashion.   A 1 millimeter clear-corneal paracentesis was made at the 1:30 position.  The anterior chamber was filled with Viscoat viscoelastic.  0.5 ml of preservative-free 1% lidocaine was injected into the anterior chamber.  A 2.4 millimeter keratome was used to make a near-clear corneal incision at the 10:30 position.  A Malyugin pupil expander was then placed through the main incision and into the anterior chamber of the eye.  The edge of the iris was secured on the lip of the pupil expander and it was released, thereby expanding the pupil to approximately 6 millimeters for completion of the cataract surgery.  Additional Viscoat was placed in the anterior chamber.  A cystotome and capsulorrhexis forceps were used to make a curvilinear capsulorrhexis.   Balanced salt  solution was used to hydrodissect and hydrodelineate the lens nucleus.   Phacoemulsification was used in stop and chop fashion to remove the lens, nucleus and epinucleus.  The remaining cortex was aspirated using the irrigation aspiration handpiece.  Additional Provisc was placed into the eye to distend the capsular bag for lens placement.  A lens was then injected into the capsular bag.  The pupil expanding ring was removed using a Kuglen hook and insertion device. The remaining viscoelastic was aspirated from the capsular bag and the anterior chamber.  The anterior chamber was filled with balanced salt solution to inflate to a physiologic pressure.   Wounds were hydrated with balanced salt solution.  The anterior chamber was inflated to a physiologic pressure with balanced salt solution. Vigamox 0.2 ml of a 1mg  per ml solution was injected into the anterior chamber for a dose of 0.2 mg of intracameral antibiotic at the completion of the case. Miostat was placed into the anterior chamber to constrict the pupil.  No wound leaks were noted.  Topical Maxitrol ointment was applied to the eye.  The patient was taken to the recovery room in stable condition without complications of anesthesia or surgery.  [name] 03/23/2016, 9:40 AM

## 2016-04-14 ENCOUNTER — Telehealth: Payer: Self-pay | Admitting: *Deleted

## 2016-04-14 NOTE — Telephone Encounter (Signed)
-----   Message from Clark sent at 04/14/2016  9:10 AM EST ----- Contact: [phone] Patients daughter [name] called and stated the patient has another knot on his neck and wants him to come in and be seen. (Pt has dialysis Mon, Wed, and Fri). Daughter would like a return call asap [phone]. Thanks!

## 2016-04-14 NOTE — Telephone Encounter (Signed)
Could not do Tuesday afternoon due to another appt so he will come Thursday at 830

## 2016-04-20 ENCOUNTER — Inpatient Hospital Stay: Payer: Medicare HMO | Admitting: Internal Medicine

## 2016-04-25 ENCOUNTER — Ambulatory Visit (INDEPENDENT_AMBULATORY_CARE_PROVIDER_SITE_OTHER): Payer: Self-pay | Admitting: Vascular Surgery

## 2016-04-25 ENCOUNTER — Encounter (INDEPENDENT_AMBULATORY_CARE_PROVIDER_SITE_OTHER): Payer: Self-pay

## 2016-05-04 ENCOUNTER — Ambulatory Visit: Payer: Medicare HMO | Admitting: Podiatry

## 2016-05-04 ENCOUNTER — Inpatient Hospital Stay: Payer: Medicare HMO | Attending: Internal Medicine | Admitting: Internal Medicine

## 2016-05-04 VITALS — BP 173/70 | HR 61 | Temp 95.6°F | Wt 209.0 lb

## 2016-05-04 DIAGNOSIS — M21371 Foot drop, right foot: Secondary | ICD-10-CM | POA: Diagnosis not present

## 2016-05-04 DIAGNOSIS — Z794 Long term (current) use of insulin: Secondary | ICD-10-CM | POA: Diagnosis not present

## 2016-05-04 DIAGNOSIS — N186 End stage renal disease: Secondary | ICD-10-CM | POA: Insufficient documentation

## 2016-05-04 DIAGNOSIS — K589 Irritable bowel syndrome without diarrhea: Secondary | ICD-10-CM | POA: Insufficient documentation

## 2016-05-04 DIAGNOSIS — D696 Thrombocytopenia, unspecified: Secondary | ICD-10-CM | POA: Insufficient documentation

## 2016-05-04 DIAGNOSIS — R911 Solitary pulmonary nodule: Secondary | ICD-10-CM | POA: Insufficient documentation

## 2016-05-04 DIAGNOSIS — N200 Calculus of kidney: Secondary | ICD-10-CM | POA: Insufficient documentation

## 2016-05-04 DIAGNOSIS — R599 Enlarged lymph nodes, unspecified: Secondary | ICD-10-CM | POA: Insufficient documentation

## 2016-05-04 DIAGNOSIS — Z79899 Other long term (current) drug therapy: Secondary | ICD-10-CM | POA: Diagnosis not present

## 2016-05-04 DIAGNOSIS — Z7982 Long term (current) use of aspirin: Secondary | ICD-10-CM | POA: Diagnosis not present

## 2016-05-04 DIAGNOSIS — K219 Gastro-esophageal reflux disease without esophagitis: Secondary | ICD-10-CM | POA: Insufficient documentation

## 2016-05-04 DIAGNOSIS — I2510 Atherosclerotic heart disease of native coronary artery without angina pectoris: Secondary | ICD-10-CM | POA: Diagnosis not present

## 2016-05-04 DIAGNOSIS — Z992 Dependence on renal dialysis: Secondary | ICD-10-CM | POA: Insufficient documentation

## 2016-05-04 DIAGNOSIS — Z8673 Personal history of transient ischemic attack (TIA), and cerebral infarction without residual deficits: Secondary | ICD-10-CM | POA: Insufficient documentation

## 2016-05-04 DIAGNOSIS — Z87891 Personal history of nicotine dependence: Secondary | ICD-10-CM | POA: Diagnosis not present

## 2016-05-04 DIAGNOSIS — C8303 Small cell B-cell lymphoma, intra-abdominal lymph nodes: Secondary | ICD-10-CM | POA: Insufficient documentation

## 2016-05-04 DIAGNOSIS — E1122 Type 2 diabetes mellitus with diabetic chronic kidney disease: Secondary | ICD-10-CM | POA: Diagnosis not present

## 2016-05-04 DIAGNOSIS — I120 Hypertensive chronic kidney disease with stage 5 chronic kidney disease or end stage renal disease: Secondary | ICD-10-CM | POA: Diagnosis not present

## 2016-05-04 NOTE — Assessment & Plan Note (Addendum)
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 Worsening left neck LN; concerns for progression in neck. Also, reviewed the most recent CT scan in [place] 2017-mild progression in mediastinum;  one of the right pelvic lymph node has stable around 1.5-2 cm. given the recent worsening thrombocytopenia platelets dropping to 90 [as per patient during dialysis]- I recommend a PET scan for further evaluation in the next few weeks.  # Again had a long discussion the patient and his son regarding the indications for treatment; and also multiple treatment options available. Otherwise patient continues to be asymptomatic from his CLL/SLL at this time. Again await the PET scan this time.   # superficial bladder cancer/ follow-up with urology.   # Follow-up in 2-3 weeks PET scan prior/ labs CBC BMP.  CC: Dr.Voora; nephrology.

## 2016-05-04 NOTE — Progress Notes (Signed)
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 Bone marrow biopsy on 03/05/13 with mildly hypercellular marrow for age 71-50% with interstitial predominantly small B-lymphocytic infiltrate estimated about 20-30% of marrow cells, mild nonspecific dyserythropoiesis, storage iron present this. Flow cytometry showed 29% CD5+ clonal B-cell population which is CD45+, CD5+, CD19+, CD20+, CD22+, CD23+, CD38+, HLA-DR+, Slg kappa+, and is CD10-, CD11b-, FMC7-. 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 PET scan on 03/03/13 with extensive hypermetabolic lymphadenopathy.  (presented with progressive Thrombocytopenia with persistent Anemia. CBC on 01/16/13 showed hemoglobin 10.0, MCV 98, platelets 80, WBC 4890 with 40% neutrophils, 54% lymphocytes, 1.4% monocytes. On Epogen and Venofer at dialysis treatments. Further workup showed small M-spike of 0.2).   Got Treanda/Rituxan x2 cycles in Dec 2014/Jan 2015. Then on single agent Rituxan q 8 weeks (got #3 dose on 09/16/13).  # DEC 2014- SMALL CELL LYMPHOMA/CLL- STAGE IV [BMBx- 03/2013- 20-30% B cell infiltrate; CD 5+/CD 20+]; FISH- Trisomy 12;DEC 2014-Treanda-Rituxan x2 [held sec to prolonged hospital/pneumoani/HD]; Rituxa q8W [last June 2015; Held sec to Superficial Bladder ca]; CT- JUNE 2017- STABLE Mediastinal LN/supraclav LN/pelvic LN; CT DEC 2017- mild progression noted ~1.5-2cm mediastinal/Ax LN. 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 Patient noted to have lumps left-sided and neck the last 1 month or so. Slowly progressing.  Patient denies any sore throat. Denies any difficulty swallowing. Denies any throat pain. Patient had a small ulcer under his tongue that is resolved. Patient denies any unusual weight loss or night sweats. Denies any new lumps or bumps any where else.  Denies any weight loss. Denies any hospitalizations or infections.  Denies any shortness of breath or cough. States that his recent platelets during dialysis was noted to be around 91. No bleeding.  REVIEW OF SYSTEMS:  A complete 10 point review of system is done which is negative except mentioned above/history of present illness.   PAST MEDICAL HISTORY :  Past Medical History:  Diagnosis Date  . Arthritis   . Bladder cancer ([facility]   . Chronic kidney disease   . Diabetes mellitus without complication ([facility]   . Hypertension   . Lymphoma ([facility] 09/27/2014    PAST SURGICAL HISTORY :   Past Surgical History:  Procedure Laterality Date  . BACK SURGERY  2007  . CATARACT EXTRACTION W/PHACO Left 03/23/2016   Procedure: CATARACT EXTRACTION PHACO AND INTRAOCULAR LENS PLACEMENT (IOC);  Surgeon: CLeandrew Koyanagi MD;  Location: [facility] ORS;  Service: Ophthalmology;  Laterality: Left;  [place]52.6 AP% 14.7 CDE 7.72 Fluid pack lot # [id number]  . CIRCUMCISION    . PERIPHERAL VASCULAR CATHETERIZATION Left 08/19/2015   Procedure: A/V Shuntogram/Fistulagram;  Surgeon: JAlgernon Huxley MD;  [facility];  Service: Cardiovascular;  Laterality: Left;  . PERIPHERAL VASCULAR CATHETERIZATION N/A 08/19/2015   Procedure: A/V Shunt Intervention;  Surgeon: JAlgernon Huxley MD;  [facility];  Service: Cardiovascular;  Laterality: N/A;    FAMILY HISTORY :   Family History  Problem Relation Age of Onset  . Kidney cancer Neg Hx   . Prostate cancer Neg Hx   .  Kidney failure Neg Hx   . Bladder Cancer Neg Hx     SOCIAL HISTORY:   Social History  Substance Use Topics  . Smoking status: Former Smoker    Types: Cigarettes    Quit date: 05/19/2013  . Smokeless tobacco: Never Used     Comment: quit  . Alcohol use No    ALLERGIES:  is allergic to daypro [oxaprozin].  MEDICATIONS:  Current Outpatient Prescriptions  Medication Sig Dispense Refill  . Acetaminophen 500 MG coapsule Take 500 mg by mouth every 6 (six) hours as needed.     [name] amLODipine (NORVASC) 10 MG tablet Take 10 mg by mouth daily.     [name] aspirin 325 MG tablet Take 325 mg by mouth daily.    . calcitRIOL (ROCALTROL) 0.25 MCG capsule Take by mouth daily.     [name] CALCIUM ACETATE, PHOS BINDER, PO Take by mouth daily.     [name] ENSURE PLUS (ENSURE PLUS) LIQD Take by mouth.    . fluticasone (FLONASE) 50 MCG/ACT nasal spray Place 1 spray into both nostrils daily.     . furosemide (LASIX) 80 MG tablet Take 80 mg by mouth. 2 tablets in am 1 tablet in PM    . gabapentin (NEURONTIN) 100 MG capsule Take 1 capsule (100 mg total) by mouth 3 (three) times daily. 90 capsule 0  . glimepiride (AMARYL) 4 MG tablet 4 mg. 2 tablets at bedtime    . Isopropyl Alcohol (ALCOHOL PREP PADS EX)     . LEVEMIR FLEXTOUCH 100 UNIT/ML Pen Inject 30 Units into the skin daily at 10 pm.     . lovastatin (MEVACOR) 20 MG tablet Take 20 mg by mouth at bedtime.    . metoCLOPramide (REGLAN) 5 MG tablet Take 5 mg by mouth 2 (two) times daily at 10 AM and 5 PM.     . metoprolol (LOPRESSOR) 100 MG tablet Qam day not on dialysis, Qpm day of dialysis    . multivitamin (RENA-VIT) TABS tablet Take 1 tablet by mouth daily.    . riTUXimab N9892 (RITUXAN) 10 MG/ML injection Inject into the vein.    [name] albuterol (PROVENTIL HFA;VENTOLIN HFA) 108 (90 BASE) MCG/ACT inhaler Inhale 2 puffs into the lungs every 4 (four) hours as needed.     [name] ULTICARE SHORT PEN NEEDLES 31G X 8 MM MISC      No current facility-administered medications for this  visit.     PHYSICAL EXAMINATION: ECOG PERFORMANCE STATUS: 3  BP (!) 173/70 (BP Location: Right Arm, Patient Position: Sitting)   Pulse 61   Temp (!) 95.6 F (35.3 C) (Tympanic)   Wt 209 lb (94.8 kg)   BMI 29.15 kg/m   Filed Weights   05/04/16 0843  Weight: 209 lb (94.8 kg)    GENERAL: Well-nourished well-developed; Alert, no distress and comfortable.  Accompanied by son; pt is ina wheel chair. EYES: no pallor or icterus OROPHARYNX: no thrush or ulceration; poor dentition.  NECK: supple, no masses felt LYMPH:  no palpable lymphadenopathy in the axillary or inguinal regions. 2-3 cm LN felet in neck.  LUNGS: clear to auscultation and  No wheeze or crackles HEART/CVS: regular rate & rhythm and no murmurs; No lower extremity edema;  R-LE in brace.  ABDOMEN:abdomen soft, non-tender and  normal bowel sounds Musculoskeletal:no cyanosis of digits and no clubbing  PSYCH: alert & oriented x 3 with fluent speech NEURO: no focal motor/sensory deficits SKIN:  no rashes or significant lesions  LABORATORY DATA:  I have reviewed the data as listed    Component Value Date/Time   NA 139 03/23/2016 0838   NA 140 11/18/2013 1030   K 4.4 03/23/2016 0838   K 4.0 11/18/2013 1030   CL 98 (L) 03/09/2016 1017   CL 97 (L) 11/18/2013 1030   CO2 29 03/09/2016 1017   CO2 33 (H) 11/18/2013 1030   GLUCOSE 166 (H) 03/23/2016 0838   GLUCOSE 265 (H) 11/18/2013 1030   BUN 37 (H) 03/09/2016 1017   BUN 30 (H) 11/18/2013 1030   CREATININE 5.65 (H) 03/09/2016 1017   CREATININE 5.05 (H) 11/18/2013 1030   CALCIUM 8.7 (L) 03/09/2016 1017   CALCIUM 8.6 11/18/2013 1030   PROT 6.9 03/09/2016 1017   PROT 7.0 11/18/2013 1030   ALBUMIN 4.4 03/09/2016 1017   ALBUMIN 3.7 11/18/2013 1030   AST 22 03/09/2016 1017   AST 15 11/18/2013 1030   ALT 28 03/09/2016 1017   ALT 25 11/18/2013 1030   ALKPHOS 57 03/09/2016 1017   ALKPHOS 105 11/18/2013 1030   BILITOT 0.5 03/09/2016 1017   BILITOT 0.3 11/18/2013 1030    GFRNONAA 9 (L) 03/09/2016 1017   GFRNONAA 11 (L) 11/18/2013 1030   GFRAA 11 (L) 03/09/2016 1017   GFRAA 13 (L) 11/18/2013 1030    No results found for: SPEP, UPEP  Lab Results  Component Value Date   WBC 4.6 03/09/2016   NEUTROABS 2.4 03/09/2016   HGB 12.6 (L) 03/23/2016   HCT 37.0 (L) 03/23/2016   MCV 93.0 03/09/2016   PLT 116 (L) 03/09/2016      Chemistry      Component Value Date/Time   NA 139 03/23/2016 0838   NA 140 11/18/2013 1030   K 4.4 03/23/2016 0838   K 4.0 11/18/2013 1030   CL 98 (L) 03/09/2016 1017   CL 97 (L) 11/18/2013 1030   CO2 29 03/09/2016 1017   CO2 33 (H) 11/18/2013 1030   BUN 37 (H) 03/09/2016 1017   BUN 30 (H) 11/18/2013 1030   CREATININE 5.65 (H) 03/09/2016 1017   CREATININE 5.05 (H) 11/18/2013 1030      Component Value Date/Time   CALCIUM 8.7 (L) 03/09/2016 1017   CALCIUM 8.6 11/18/2013 1030   ALKPHOS 57 03/09/2016 1017   ALKPHOS 105 11/18/2013 1030   AST 22 03/09/2016 1017   AST 15 11/18/2013 1030   ALT 28 03/09/2016 1017   ALT 25 11/18/2013 1030   BILITOT 0.5 03/09/2016 1017   BILITOT 0.3 11/18/2013 1030     IMPRESSION: Slight progression of mediastinal lymphadenopathy compared to previous studies dating back to 05/13/2015. Stable mild bilateral axillary lymphadenopathy.  Resolution of right middle lobe ground-glass opacity, consistent with resolving inflammatory or infectious process. No active lung disease.  Incidental findings including aortic and coronary artery atherosclerosis and nonobstructive left renal calculus.  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 Worsening left neck LN; concerns for progression in neck. Also, reviewed the most recent CT scan in [place] 2017-mild progression in mediastinum;  one of the right pelvic lymph node has stable around 1.5-2 cm. given the recent worsening thrombocytopenia  platelets dropping to 90 [as per patient during dialysis]- I recommend a  PET scan for further evaluation in the next few weeks.  # Again had a long discussion the patient and his son regarding the indications for treatment; and also multiple treatment options available. Otherwise patient continues to be asymptomatic from his CLL/SLL at this time. Again await the PET scan this time.   # superficial bladder cancer/ follow-up with urology.   # Follow-up in 2-3 weeks PET scan prior/ labs CBC BMP.  CC: Dr.Voora; nephrology.   # 25 minutes face-to-face with the patient discussing the above plan of care; more than 50% of time spent on prognosis/ natural history; counseling and coordination.   Orders Placed This Encounter  Procedures  . NM PET Image Restag (PS) Skull Base To Thigh    Standing Status:   Future    Standing Expiration Date:   07/04/2017    Order Specific Question:   Reason for Exam (SYMPTOM  OR DIAGNOSIS REQUIRED)    Answer:   lymphoma    Order Specific Question:   Preferred imaging location?    Answer:   [facility]  . CBC with Differential    Standing Status:   Future    Standing Expiration Date:   05/04/2017  . Comprehensive metabolic panel    Standing Status:   Future    Standing Expiration Date:   05/04/2017  . Lactate dehydrogenase    Standing Status:   Future    Standing Expiration Date:   05/04/2017       Cammie Sickle, MD 05/04/2016 12:17 PM

## 2016-05-04 NOTE — Progress Notes (Signed)
Patient here today for follow up.  Patient c/o "knots" in neck, tender to touch, noticed about 1 month ago

## 2016-05-08 ENCOUNTER — Encounter: Payer: Self-pay | Admitting: Podiatry

## 2016-05-08 ENCOUNTER — Encounter (INDEPENDENT_AMBULATORY_CARE_PROVIDER_SITE_OTHER): Payer: Medicare HMO | Admitting: Podiatry

## 2016-05-09 ENCOUNTER — Encounter: Payer: Self-pay | Admitting: Podiatry

## 2016-05-09 ENCOUNTER — Ambulatory Visit (INDEPENDENT_AMBULATORY_CARE_PROVIDER_SITE_OTHER): Payer: Medicare HMO | Admitting: Podiatry

## 2016-05-09 DIAGNOSIS — E0843 Diabetes mellitus due to underlying condition with diabetic autonomic (poly)neuropathy: Secondary | ICD-10-CM | POA: Diagnosis not present

## 2016-05-09 DIAGNOSIS — L608 Other nail disorders: Secondary | ICD-10-CM

## 2016-05-09 DIAGNOSIS — Q828 Other specified congenital malformations of skin: Secondary | ICD-10-CM | POA: Diagnosis not present

## 2016-05-09 DIAGNOSIS — M79609 Pain in unspecified limb: Secondary | ICD-10-CM | POA: Diagnosis not present

## 2016-05-09 DIAGNOSIS — B351 Tinea unguium: Secondary | ICD-10-CM | POA: Diagnosis not present

## 2016-05-09 DIAGNOSIS — L603 Nail dystrophy: Secondary | ICD-10-CM | POA: Diagnosis not present

## 2016-05-12 NOTE — Progress Notes (Signed)
This encounter was created in error - please disregard.

## 2016-05-15 ENCOUNTER — Encounter: Payer: Self-pay | Admitting: *Deleted

## 2016-05-15 NOTE — Progress Notes (Signed)
   SUBJECTIVE Patient with a history of diabetes mellitus presents to office today complaining of elongated, thickened nails. Pain while ambulating in shoes. Patient is unable to trim their own nails.   OBJECTIVE General Patient is awake, alert, and oriented x 3 and in no acute distress. Derm painful hyperkeratotic callus lesion also noted to the fifth MPJ left foot. Skin is dry and supple bilateral. Negative open lesions or macerations. Remaining integument unremarkable. Nails are tender, long, thickened and dystrophic with subungual debris, consistent with onychomycosis, 1-5 bilateral. No signs of infection noted. Vasc  DP and PT pedal pulses palpable bilaterally. Temperature gradient within normal limits.  Neuro Epicritic and protective threshold sensation diminished bilaterally.  Musculoskeletal Exam No symptomatic pedal deformities noted bilateral. Muscular strength within normal limits.  ASSESSMENT 1. Diabetes Mellitus w/ peripheral neuropathy 2. Onychomycosis of nail due to dermatophyte bilateral 3. Pain in foot bilateral 4. 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 Patient evaluated today. 2. Instructed to maintain good pedal hygiene and foot care. Stressed importance of controlling blood sugar.  3. Mechanical debridement of nails 1-5 bilaterally performed using a nail nipper. Filed with dremel without incident.  4. Excisional debridement of the callus lesion was performed using a chisel blade without incident. 5. Return to clinic in 3 mos.     Edrick Kins, DPM [facility]  Dr. Edrick Kins, [place]                                        [place], [place] [address]                Office [phone]  Fax [phone]

## 2016-05-16 ENCOUNTER — Ambulatory Visit: Payer: Medicare HMO

## 2016-05-16 ENCOUNTER — Ambulatory Visit
Admission: RE | Admit: 2016-05-16 | Discharge: 2016-05-16 | Disposition: A | Discharge status: Home / Self Care | Payer: Medicare HMO | Source: Ambulatory Visit | Attending: Internal Medicine | Admitting: Internal Medicine

## 2016-05-16 DIAGNOSIS — R590 Localized enlarged lymph nodes: Secondary | ICD-10-CM | POA: Insufficient documentation

## 2016-05-16 DIAGNOSIS — C8303 Small cell B-cell lymphoma, intra-abdominal lymph nodes: Secondary | ICD-10-CM | POA: Insufficient documentation

## 2016-05-16 DIAGNOSIS — C9110 Chronic lymphocytic leukemia of B-cell type not having achieved remission: Secondary | ICD-10-CM | POA: Insufficient documentation

## 2016-05-16 LAB — GLUCOSE, CAPILLARY: GLUCOSE-CAPILLARY: 51 mg/dL — AB (ref 65–99)

## 2016-05-16 MED ORDER — FLUDEOXYGLUCOSE F - 18 (FDG) INJECTION
12.7900 | Freq: Once | INTRAVENOUS | Status: AC | PRN
  Administered 2016-05-16: 12.79 via INTRAVENOUS

## 2016-05-18 ENCOUNTER — Inpatient Hospital Stay (HOSPITAL_BASED_OUTPATIENT_CLINIC_OR_DEPARTMENT_OTHER): Payer: Medicare HMO

## 2016-05-18 ENCOUNTER — Inpatient Hospital Stay (HOSPITAL_BASED_OUTPATIENT_CLINIC_OR_DEPARTMENT_OTHER): Payer: Medicare HMO | Admitting: Internal Medicine

## 2016-05-18 VITALS — BP 151/69 | HR 60 | Temp 95.7°F | Wt 209.4 lb

## 2016-05-18 DIAGNOSIS — R911 Solitary pulmonary nodule: Secondary | ICD-10-CM | POA: Diagnosis not present

## 2016-05-18 DIAGNOSIS — C8303 Small cell B-cell lymphoma, intra-abdominal lymph nodes: Secondary | ICD-10-CM | POA: Diagnosis not present

## 2016-05-18 DIAGNOSIS — Z79899 Other long term (current) drug therapy: Secondary | ICD-10-CM

## 2016-05-18 DIAGNOSIS — D696 Thrombocytopenia, unspecified: Secondary | ICD-10-CM

## 2016-05-18 DIAGNOSIS — R599 Enlarged lymph nodes, unspecified: Secondary | ICD-10-CM | POA: Diagnosis not present

## 2016-05-18 LAB — COMPREHENSIVE METABOLIC PANEL
ALBUMIN: 4.2 g/dL (ref 3.5–5.0)
ALT: 24 U/L (ref 17–63)
AST: 21 U/L (ref 15–41)
Alkaline Phosphatase: 58 U/L (ref 38–126)
Anion gap: 9 (ref 5–15)
BILIRUBIN TOTAL: 0.6 mg/dL (ref 0.3–1.2)
BUN: 35 mg/dL — AB (ref 6–20)
CHLORIDE: 98 mmol/L — AB (ref 101–111)
CO2: 29 mmol/L (ref 22–32)
Calcium: 8.6 mg/dL — ABNORMAL LOW (ref 8.9–10.3)
Creatinine, Ser: 6.2 mg/dL — ABNORMAL HIGH (ref 0.61–1.24)
GFR calc Af Amer: 9 mL/min — ABNORMAL LOW (ref >60)
GFR calc non Af Amer: 8 mL/min — ABNORMAL LOW (ref >60)
GLUCOSE: 133 mg/dL — AB (ref 65–99)
Potassium: 4.2 mmol/L (ref 3.5–5.1)
SODIUM: 136 mmol/L (ref 135–145)
TOTAL PROTEIN: 6.9 g/dL (ref 6.5–8.1)

## 2016-05-18 LAB — CBC WITH DIFFERENTIAL/PLATELET
BASOS ABS: 0.1 10*3/uL (ref 0–0.1)
BASOS PCT: 1 %
Eosinophils Absolute: 0.2 10*3/uL (ref 0–0.7)
Eosinophils Relative: 5 %
HEMATOCRIT: 35 % — AB (ref 40.0–52.0)
Hemoglobin: 12.2 g/dL — ABNORMAL LOW (ref 13.0–18.0)
Lymphocytes Relative: 35 %
Lymphs Abs: 1.6 10*3/uL (ref 1.0–3.6)
MCH: 32.8 pg (ref 26.0–34.0)
MCHC: 34.7 g/dL (ref 32.0–36.0)
MCV: 94.4 fL (ref 80.0–100.0)
Monocytes Absolute: 0.2 10*3/uL (ref 0.2–1.0)
Monocytes Relative: 5 %
NEUTROS ABS: 2.5 10*3/uL (ref 1.4–6.5)
NEUTROS PCT: 54 %
Platelets: 116 10*3/uL — ABNORMAL LOW (ref 150–440)
RBC: 3.71 MIL/uL — ABNORMAL LOW (ref 4.40–5.90)
RDW: 13.9 % (ref 11.5–14.5)
WBC: 4.7 10*3/uL (ref 3.8–10.6)

## 2016-05-18 LAB — LACTATE DEHYDROGENASE: LDH: 143 U/L (ref 98–192)

## 2016-05-18 NOTE — Progress Notes (Signed)
Patient here today for follow up.   

## 2016-05-18 NOTE — Progress Notes (Signed)
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 Bone marrow biopsy on 03/05/13 with mildly hypercellular marrow for age 71-50% with interstitial predominantly small B-lymphocytic infiltrate estimated about 20-30% of marrow cells, mild nonspecific dyserythropoiesis, storage iron present this. Flow cytometry showed 29% CD5+ clonal B-cell population which is CD45+, CD5+, CD19+, CD20+, CD22+, CD23+, CD38+, HLA-DR+, Slg kappa+, and is CD10-, CD11b-, FMC7-. 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 PET scan on 03/03/13 with extensive hypermetabolic lymphadenopathy.  (presented with progressive Thrombocytopenia with persistent Anemia. CBC on 01/16/13 showed hemoglobin 10.0, MCV 98, platelets 80, WBC 4890 with 40% neutrophils, 54% lymphocytes, 1.4% monocytes. On Epogen and Venofer at dialysis treatments. Further workup showed small M-spike of 0.2).   Got Treanda/Rituxan x2 cycles in Dec 2014/Jan 2015. Then on single agent Rituxan q 8 weeks (got #3 dose on 09/16/13).  # DEC 2014- SMALL CELL LYMPHOMA/CLL- STAGE IV [BMBx- 03/2013- 20-30% B cell infiltrate; CD 5+/CD 20+]; FISH- Trisomy 12;DEC 2014-Treanda-Rituxan x2 [held sec to prolonged hospital/pneumoani/HD]; Rituxa q8W [last June 2015; Held sec to Superficial Bladder ca]; CT- JUNE 2017- STABLE Mediastinal LN/supraclav LN/pelvic LN; CT DEC 2017- mild progression noted ~1.5-2cm mediastinal/Ax LN. 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 Patient noted to have lumps left-sided and neck the last 1 month or so- is here to review the results of the PET scan.  Denies any worsening neck lymph nodes since the last visit approximately 10 days ago. Patient denies any unusual weight loss or night sweats. Denies any new lumps or bumps any where else.  Denies any weight loss. Denies any hospitalizations or infections.  Denies any shortness of breath or cough. No bleeding.  REVIEW OF SYSTEMS:  A complete 10 point review of system is done which is negative except mentioned above/history of present illness.   PAST MEDICAL HISTORY :  Past Medical History:  Diagnosis Date  . Anemia   . Arthritis   . Bladder cancer ([facility]   . Chronic kidney disease   . Diabetes mellitus without complication ([facility]   . Dialysis patient ([facility]    M,W,F  . Dyspnea    DOE  . GERD (gastroesophageal reflux disease)   . HOH (hard of hearing)   . Hypertension   . IBS (irritable bowel syndrome)   . Lymphoma ([facility] 09/27/2014  . Neuropathy (HCC)    RIGHT LEG  . Stroke ([facility]    TIA    PAST SURGICAL HISTORY :   Past Surgical History:  Procedure Laterality Date  . BACK SURGERY  2007  . CATARACT EXTRACTION W/PHACO Left 03/23/2016   Procedure: CATARACT EXTRACTION PHACO AND INTRAOCULAR LENS PLACEMENT (IOC);  Surgeon: CLeandrew Koyanagi MD;  Location: [facility] ORS;  Service: Ophthalmology;  Laterality: Left;  [place]52.6 AP% 14.7 CDE 7.72 Fluid pack lot # [id number]  . CIRCUMCISION    . PERIPHERAL VASCULAR CATHETERIZATION Left 08/19/2015   Procedure: A/V Shuntogram/Fistulagram;  Surgeon: Algernon Huxley, MD;  Location: [facility] CV LAB;  Service: Cardiovascular;  Laterality: Left;  . PERIPHERAL VASCULAR CATHETERIZATION N/A 08/19/2015   Procedure: A/V Shunt Intervention;  Surgeon: Algernon Huxley, MD;  Location: [facility] LAB;  Service: Cardiovascular;  Laterality: N/A;    FAMILY  HISTORY :   Family History  Problem Relation Age of Onset  . Kidney cancer Neg Hx   . Prostate cancer Neg Hx   . Kidney failure Neg Hx   . Bladder Cancer Neg Hx     SOCIAL HISTORY:   Social History  Substance Use Topics  . Smoking status: Former Smoker    Types: Cigarettes    Quit date: 05/19/2013  . Smokeless tobacco: Never Used     Comment: quit  . Alcohol use No    ALLERGIES:  is allergic to ibuprofen; multivitamin [centrum]; and daypro [oxaprozin].  MEDICATIONS:  Current Outpatient Prescriptions  Medication Sig Dispense Refill  . amLODipine (NORVASC) 10 MG tablet Take 10 mg by mouth daily.     [name] aspirin 325 MG tablet Take 325 mg by mouth daily.    . calcitRIOL (ROCALTROL) 0.25 MCG capsule Take by mouth daily.     [name] CALCIUM ACETATE, PHOS BINDER, PO Take by mouth daily.     [name] ENSURE PLUS (ENSURE PLUS) LIQD Take by mouth 3 (three) times a week. M,W.F    . fluticasone (FLONASE) 50 MCG/ACT nasal spray Place 1 spray into both nostrils daily.     . furosemide (LASIX) 80 MG tablet Take 80 mg by mouth. 2 tablets in am 1 tablet in PM    . gabapentin (NEURONTIN) 100 MG capsule Take 1 capsule (100 mg total) by mouth 3 (three) times daily. (Patient taking differently: Take 100 mg by mouth at bedtime. 1 OR 2 HS) 90 capsule 0  . glimepiride (AMARYL) 4 MG tablet 4 mg. 2 tablets at bedtime    . Isopropyl Alcohol (ALCOHOL PREP PADS EX)     . LEVEMIR FLEXTOUCH 100 UNIT/ML Pen Inject 30 Units into the skin daily at 10 pm.     . lovastatin (MEVACOR) 20 MG tablet Take 20 mg by mouth at bedtime.    . metoCLOPramide (REGLAN) 5 MG tablet Take 5 mg by mouth daily. 1 TAB M,W,F 1TAB BID T,T,S,S    . metoprolol (LOPRESSOR) 100 MG tablet 1 SUNDAY AM    M,W,F HS ONLY   TUES AM ONLY   NONE THURS   BID SAT    . multivitamin (RENA-VIT) TABS tablet Take 1 tablet by mouth daily.    . riTUXimab H4742 (RITUXAN) 10 MG/ML injection Inject into the vein. EVERY 8 WEEKS AT CANCER CENTER    . ULTICARE SHORT PEN  NEEDLES 31G X 8 MM MISC     . albuterol (PROVENTIL HFA;VENTOLIN HFA) 108 (90 BASE) MCG/ACT inhaler Inhale 2 puffs into the lungs every 4 (four) hours as needed.      No current facility-administered medications for this visit.     PHYSICAL EXAMINATION: ECOG PERFORMANCE STATUS: 3  BP (!) 151/69 (BP Location: Right Arm, Patient Position: Sitting)   Pulse 60   Temp (!) 95.7 F (35.4 C) (Tympanic)   Wt 209 lb 6 oz (95 kg)   BMI 29.20 kg/m   Filed Weights   05/18/16 0935  Weight: 209 lb 6 oz (95 kg)    GENERAL: Well-nourished well-developed; Alert, no distress and comfortable.  Accompanied by son; pt is ina wheel chair. EYES:  no pallor or icterus OROPHARYNX: no thrush or ulceration; poor dentition.  NECK: supple, no masses felt LYMPH:  no palpable lymphadenopathy in the axillary or inguinal regions. 2-3 cm LN felet in neck.  LUNGS: clear to auscultation and  No wheeze or crackles HEART/CVS: regular rate & rhythm and no murmurs; No lower extremity edema;  R-LE in brace.  ABDOMEN:abdomen soft, non-tender and normal bowel sounds Musculoskeletal:no cyanosis of digits and no clubbing  PSYCH: alert & oriented x 3 with fluent speech NEURO: no focal motor/sensory deficits SKIN:  no rashes or significant lesions  LABORATORY DATA:  I have reviewed the data as listed    Component Value Date/Time   NA 136 05/18/2016 0850   NA 140 11/18/2013 1030   K 4.2 05/18/2016 0850   K 4.0 11/18/2013 1030   CL 98 (L) 05/18/2016 0850   CL 97 (L) 11/18/2013 1030   CO2 29 05/18/2016 0850   CO2 33 (H) 11/18/2013 1030   GLUCOSE 133 (H) 05/18/2016 0850   GLUCOSE 265 (H) 11/18/2013 1030   BUN 35 (H) 05/18/2016 0850   BUN 30 (H) 11/18/2013 1030   CREATININE 6.20 (H) 05/18/2016 0850   CREATININE 5.05 (H) 11/18/2013 1030   CALCIUM 8.6 (L) 05/18/2016 0850   CALCIUM 8.6 11/18/2013 1030   PROT 6.9 05/18/2016 0850   PROT 7.0 11/18/2013 1030   ALBUMIN 4.2 05/18/2016 0850   ALBUMIN 3.7 11/18/2013 1030    AST 21 05/18/2016 0850   AST 15 11/18/2013 1030   ALT 24 05/18/2016 0850   ALT 25 11/18/2013 1030   ALKPHOS 58 05/18/2016 0850   ALKPHOS 105 11/18/2013 1030   BILITOT 0.6 05/18/2016 0850   BILITOT 0.3 11/18/2013 1030   GFRNONAA 8 (L) 05/18/2016 0850   GFRNONAA 11 (L) 11/18/2013 1030   GFRAA 9 (L) 05/18/2016 0850   GFRAA 13 (L) 11/18/2013 1030    No results found for: SPEP, UPEP  Lab Results  Component Value Date   WBC 4.7 05/18/2016   NEUTROABS 2.5 05/18/2016   HGB 12.2 (L) 05/18/2016   HCT 35.0 (L) 05/18/2016   MCV 94.4 05/18/2016   PLT 116 (L) 05/18/2016      Chemistry      Component Value Date/Time   NA 136 05/18/2016 0850   NA 140 11/18/2013 1030   K 4.2 05/18/2016 0850   K 4.0 11/18/2013 1030   CL 98 (L) 05/18/2016 0850   CL 97 (L) 11/18/2013 1030   CO2 29 05/18/2016 0850   CO2 33 (H) 11/18/2013 1030   BUN 35 (H) 05/18/2016 0850   BUN 30 (H) 11/18/2013 1030   CREATININE 6.20 (H) 05/18/2016 0850   CREATININE 5.05 (H) 11/18/2013 1030      Component Value Date/Time   CALCIUM 8.6 (L) 05/18/2016 0850   CALCIUM 8.6 11/18/2013 1030   ALKPHOS 58 05/18/2016 0850   ALKPHOS 105 11/18/2013 1030   AST 21 05/18/2016 0850   AST 15 11/18/2013 1030   ALT 24 05/18/2016 0850   ALT 25 11/18/2013 1030   BILITOT 0.6 05/18/2016 0850   BILITOT 0.3 11/18/2013 1030     IIMPRESSION: 1. Mild Progression of chronic lymphocytic leukemia. Enlarged hypermetabolic LEFT level 2, level 3, and LEFT supraclavicular nodes. 2. Mild mediastinal lymphadenopathy is similar to most recent CT scan. 3. Interval enlargement mildly hypermetabolic RIGHT iliac lymph nodes increased in size splenic 09/07/2015. 4. No splenomegaly or bone involvement.   Electronically Signed   By: Helane Gunther.D.  On: 05/16/2016 15:17  ASSESSMENT & PLAN:   Small B-cell lymphoma of intra-abdominal lymph nodes ([place]) # SLL/CLL- stage IV- currently on surveillance given multiple comorbidities.  Worsening left neck LN; concerns for progression in neck. PET scan confirms mild progression in neck; pelvis [increase in right pelvic LN]; stable mediastinal LN. However patient continues to be asymptomatic.  # Again had a long discussion the patient and his son regarding the indications for treatment; and also multiple treatment options available. As specifically discussed regarding use of Gazyva IV infusion; discussed the potential infusion reactions as a side effect; along with red infections. We'll check hepatitis panel at next visit. Also discussed regarding use of Ibrutinib- and again discussed the potential side effects of A. fib and also increased risk of bleeding. As patient continues to be asymptomatic from his CLL/SLL at this time plan is to continue surveillance.   # Platelets today 116 hemoglobin 12 /white count normal. Reviewed with pt/son.   #  patient will follow-up with me in 3 months labs.   # I reviewed the blood work- with the patient in detail; also reviewed the imaging independently [as summarized above]; and with the patient in detail.   CC: Dr.Voora; nephrology.   # 25 minutes face-to-face with the patient discussing the above plan of care; more than 50% of time spent on prognosis/ natural history; counseling and coordination.   Orders Placed This Encounter  Procedures  . CBC with Differential/Platelet    Standing Status:   Future    Standing Expiration Date:   11/15/2016  . Basic metabolic panel    Standing Status:   Future    Standing Expiration Date:   11/15/2016  . Lactate dehydrogenase    Standing Status:   Future    Standing Expiration Date:   11/15/2016  . Hepatic function panel    Standing Status:   Future    Standing Expiration Date:   05/18/2017       Cammie Sickle, MD 05/18/2016 11:07 AM

## 2016-05-18 NOTE — Assessment & Plan Note (Addendum)
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 Worsening left neck LN; concerns for progression in neck. PET scan confirms mild progression in neck; pelvis [increase in right pelvic LN]; stable mediastinal LN. However patient continues to be asymptomatic.  # Again had a long discussion the patient and his son regarding the indications for treatment; and also multiple treatment options available. As specifically discussed regarding use of Gazyva IV infusion; discussed the potential infusion reactions as a side effect; along with red infections. We'll check hepatitis panel at next visit. Also discussed regarding use of Ibrutinib- and again discussed the potential side effects of A. fib and also increased risk of bleeding. As patient continues to be asymptomatic from his CLL/SLL at this time plan is to continue surveillance.   # Platelets today 116 hemoglobin 12 /white count normal. Reviewed with pt/son.   #  patient will follow-up with me in 3 months labs.   # I reviewed the blood work- with the patient in detail; also reviewed the imaging independently [as summarized above]; and with the patient in detail.   CC: Dr.Voora; nephrology.

## 2016-05-30 ENCOUNTER — Encounter
Admission: RE | Disposition: A | Discharge status: Home / Self Care | Payer: Self-pay | Source: Ambulatory Visit | Attending: Ophthalmology

## 2016-05-30 ENCOUNTER — Encounter: Payer: Self-pay | Admitting: Anesthesiology

## 2016-05-30 ENCOUNTER — Encounter (INDEPENDENT_AMBULATORY_CARE_PROVIDER_SITE_OTHER): Payer: Self-pay

## 2016-05-30 ENCOUNTER — Ambulatory Visit (INDEPENDENT_AMBULATORY_CARE_PROVIDER_SITE_OTHER): Payer: Self-pay | Admitting: Vascular Surgery

## 2016-05-30 ENCOUNTER — Ambulatory Visit
Admission: RE | Admit: 2016-05-30 | Discharge: 2016-05-30 | Disposition: A | Discharge status: Home / Self Care | Payer: Medicare HMO | Source: Ambulatory Visit | Attending: Ophthalmology | Admitting: Ophthalmology

## 2016-05-30 ENCOUNTER — Encounter: Payer: Self-pay | Admitting: *Deleted

## 2016-05-30 DIAGNOSIS — H2511 Age-related nuclear cataract, right eye: Secondary | ICD-10-CM | POA: Insufficient documentation

## 2016-05-30 HISTORY — DX: Gastro-esophageal reflux disease without esophagitis: K21.9

## 2016-05-30 HISTORY — PX: CATARACT EXTRACTION W/PHACO: SHX586

## 2016-05-30 HISTORY — DX: Dyspnea, unspecified: R06.00

## 2016-05-30 HISTORY — DX: Anemia, unspecified: D64.9

## 2016-05-30 HISTORY — DX: Irritable bowel syndrome, unspecified: K58.9

## 2016-05-30 HISTORY — DX: Cerebral infarction, unspecified: I63.9

## 2016-05-30 HISTORY — DX: Polyneuropathy, unspecified: G62.9

## 2016-05-30 HISTORY — DX: Unspecified hearing loss, unspecified ear: H91.90

## 2016-05-30 LAB — GLUCOSE, CAPILLARY: GLUCOSE-CAPILLARY: 120 mg/dL — AB (ref 65–99)

## 2016-05-30 LAB — POCT I-STAT 4, (NA,K, GLUC, HGB,HCT)
GLUCOSE: 121 mg/dL — AB (ref 65–99)
HEMATOCRIT: 35 % — AB (ref 39.0–52.0)
Hemoglobin: 11.9 g/dL — ABNORMAL LOW (ref 13.0–17.0)
Potassium: 4.4 mmol/L (ref 3.5–5.1)
SODIUM: 137 mmol/L (ref 135–145)

## 2016-05-30 SURGERY — PHACOEMULSIFICATION, CATARACT, WITH IOL INSERTION
Anesthesia: Topical | Site: Eye | Laterality: Right | Wound class: Clean

## 2016-05-30 MED ORDER — MOXIFLOXACIN HCL 0.5 % OP SOLN
1.0000 [drp] | OPHTHALMIC | Status: AC
  Administered 2016-05-30 (×3): 1 [drp] via OPHTHALMIC

## 2016-05-30 MED ORDER — MOXIFLOXACIN HCL 0.5 % OP SOLN
OPHTHALMIC | Status: AC
  Administered 2016-05-30: 1 [drp] via OPHTHALMIC
  Filled 2016-05-30: qty 3

## 2016-05-30 MED ORDER — EPINEPHRINE PF 1 MG/ML IJ SOLN
INTRAMUSCULAR | Status: AC
  Filled 2016-05-30: qty 2

## 2016-05-30 MED ORDER — CARBACHOL 0.01 % IO SOLN
INTRAOCULAR | Status: DC | PRN
  Administered 2016-05-30: .5 mL via INTRAOCULAR

## 2016-05-30 MED ORDER — SODIUM CHLORIDE 0.9 % IV SOLN: INTRAVENOUS | Status: DC

## 2016-05-30 MED ORDER — NA HYALUR & NA CHOND-NA HYALUR 0.55-0.5 ML IO KIT
PACK | INTRAOCULAR | Status: AC
  Filled 2016-05-30: qty 1.05

## 2016-05-30 MED ORDER — LIDOCAINE HCL (PF) 4 % IJ SOLN
INTRAOCULAR | Status: DC | PRN
  Administered 2016-05-30: 2.25 mL via OPHTHALMIC

## 2016-05-30 MED ORDER — MOXIFLOXACIN HCL 0.5 % OP SOLN
OPHTHALMIC | Status: DC | PRN
  Administered 2016-05-30: .2 mL via OPHTHALMIC

## 2016-05-30 MED ORDER — POVIDONE-IODINE 5 % OP SOLN
OPHTHALMIC | Status: DC | PRN
  Administered 2016-05-30: 1 via OPHTHALMIC

## 2016-05-30 MED ORDER — ARMC OPHTHALMIC DILATING DROPS
OPHTHALMIC | Status: AC
  Administered 2016-05-30: 1 via OPHTHALMIC
  Filled 2016-05-30: qty 0.4

## 2016-05-30 MED ORDER — NEOMYCIN-POLYMYXIN-DEXAMETH 0.1 % OP OINT
TOPICAL_OINTMENT | OPHTHALMIC | Status: DC | PRN
  Administered 2016-05-30: 1 via OPHTHALMIC

## 2016-05-30 MED ORDER — EPINEPHRINE PF 1 MG/ML IJ SOLN
INTRAOCULAR | Status: DC | PRN
  Administered 2016-05-30: 1 mL via OPHTHALMIC

## 2016-05-30 MED ORDER — NA HYALUR & NA CHOND-NA HYALUR 0.4-0.35 ML IO KIT
PACK | INTRAOCULAR | Status: DC | PRN
  Administered 2016-05-30: 1 mL via INTRAOCULAR

## 2016-05-30 MED ORDER — ONDANSETRON HCL 4 MG/2ML IJ SOLN: 4.0000 mg | Freq: Once | INTRAMUSCULAR | Status: DC | PRN

## 2016-05-30 MED ORDER — ARMC OPHTHALMIC DILATING DROPS
1.0000 "application " | OPHTHALMIC | Status: AC
  Administered 2016-05-30 (×3): 1 via OPHTHALMIC

## 2016-05-30 MED ORDER — CEFUROXIME OPHTHALMIC INJECTION 1 MG/0.1 ML
INJECTION | OPHTHALMIC | Status: AC
  Filled 2016-05-30: qty 0.1

## 2016-05-30 MED ORDER — FENTANYL CITRATE (PF) 100 MCG/2ML IJ SOLN: 25.0000 ug | INTRAMUSCULAR | Status: DC | PRN

## 2016-05-30 SURGICAL SUPPLY — 21 items
CANNULA ANT/CHMB 27GA (MISCELLANEOUS) ×3 IMPLANT
CUP MEDICINE 2OZ PLAST GRAD ST (MISCELLANEOUS) ×3 IMPLANT
GLOVE BIO SURGEON STRL SZ8 (GLOVE) ×3 IMPLANT
GLOVE BIOGEL M 6.5 STRL (GLOVE) ×3 IMPLANT
GLOVE SURG LX 7.5 STRW (GLOVE) ×2
GLOVE SURG LX STRL 7.5 STRW (GLOVE) ×1 IMPLANT
GOWN STRL REUS W/ TWL LRG LVL3 (GOWN DISPOSABLE) ×2 IMPLANT
GOWN STRL REUS W/TWL LRG LVL3 (GOWN DISPOSABLE) ×4
LENS IOL TECNIS ITEC 19.0 (Intraocular Lens) ×3 IMPLANT
PACK CATARACT (MISCELLANEOUS) ×3 IMPLANT
PACK CATARACT BRASINGTON LX (MISCELLANEOUS) ×3 IMPLANT
PACK EYE AFTER SURG (MISCELLANEOUS) ×3 IMPLANT
SOL BSS BAG (MISCELLANEOUS) ×3
SOL PREP PVP 2OZ (MISCELLANEOUS) ×3
SOLUTION BSS BAG (MISCELLANEOUS) ×1 IMPLANT
SOLUTION PREP PVP 2OZ (MISCELLANEOUS) ×1 IMPLANT
SYR 3ML LL SCALE MARK (SYRINGE) ×3 IMPLANT
SYR 5ML LL (SYRINGE) ×3 IMPLANT
SYR TB 1ML 27GX1/2 LL (SYRINGE) ×3 IMPLANT
WATER STERILE IRR 250ML POUR (IV SOLUTION) ×3 IMPLANT
WIPE NON LINTING 3.25X3.25 (MISCELLANEOUS) ×3 IMPLANT

## 2016-05-30 NOTE — H&P (Signed)
The History and Physical notes are on paper, have been signed, and are to be scanned. The patient remains stable and unchanged from the H&P.   Previous H&P reviewed, patient examined, and there are no changes.  Mark [name] 05/30/2016 8:36 AM

## 2016-05-30 NOTE — Progress Notes (Signed)
Several attempts to start IV by 3 nurses, 1 nurse anesthesia, 1 anesthesiologist. All unsuccessful. Patient to have extra numbing medication in the eye back in the OR by Dr. Wallace Going.

## 2016-05-30 NOTE — Discharge Instructions (Signed)
Eye Surgery Discharge Instructions  Expect mild scratchy sensation or mild soreness. DO NOT RUB YOUR EYE!  The day of surgery:  Minimal physical activity, but bed rest is not required  No reading, computer work, or close hand work  No bending, lifting, or straining.  May watch TV  For 24 hours:  No driving, legal decisions, or alcoholic beverages  Safety precautions  Eat anything you prefer: It is better to start with liquids, then soup then solid foods.  _____ Eye patch should be worn until postoperative exam tomorrow.  ____ Solar shield eyeglasses should be worn for comfort in the sunlight/patch while sleeping  Resume all regular medications including aspirin or Coumadin if these were discontinued prior to surgery. You may shower, bathe, shave, or wash your hair. Tylenol may be taken for mild discomfort.  Call your doctor if you experience significant pain, nausea, or vomiting, fever > 101 or other signs of infection. [phone] or [phone] Specific instructions:  Follow-up Information    BRASINGTON,CHADWICK, MD Follow up on 05/30/2016.   Specialty:  Ophthalmology Why:  1:55 Contact information: [address]   [place] [address] [phone]

## 2016-05-30 NOTE — Op Note (Signed)
OPERATIVE NOTE  Mark [name] [id number] 05/30/2016   PREOPERATIVE DIAGNOSIS:  Nuclear Sclerotic Cataract Right Eye H25.11   POSTOPERATIVE DIAGNOSIS: Nuclear Sclerotic Cataract Right Eye H25.11          PROCEDURE:  Phacoemusification with posterior chamber intraocular lens placement of the right eye   LENS:   Implant Name Type Inv. Item Serial No. Manufacturer Lot No. LRB No. Used  LENS IOL DIOP 19.0 - [id number] Intraocular Lens LENS IOL DIOP 19.0 [id number] AMO   Right 1       ULTRASOUND TIME: 14 %  of 1 minutes 8 seconds, CDE 9.5  SURGEON:  Wyonia Hough, MD   ANESTHESIA:  Topical with tetracaine drops and 2% Xylocaine jelly, augmented with 1% preservative-free intracameral lidocaine.    COMPLICATIONS:  None.   DESCRIPTION OF PROCEDURE:  The patient was identified in the holding room and transported to the operating room and placed in the supine position under the operating microscope. Theright eye was identified as the operative eye and it was prepped and draped in the usual sterile ophthalmic fashion.   A 1 millimeter clear-corneal paracentesis was made at the 12:00 position.  0.5 ml of preservative-free 1% lidocaine was injected into the anterior chamber. The anterior chamber was filled with Viscoat viscoelastic.  A 2.4 millimeter keratome was used to make a near-clear corneal incision at the 9:00 position. A curvilinear capsulorrhexis was made with a cystotome and capsulorrhexis forceps.  Balanced salt solution was used to hydrodissect and hydrodelineate the nucleus.   Phacoemulsification was then used in stop and chop fashion to remove the lens nucleus and epinucleus.  The remaining cortex was then removed using the irrigation and aspiration handpiece. Provisc was then placed into the capsular bag to distend it for lens placement.  A lens was then injected into the capsular bag.  The remaining viscoelastic was aspirated.  Wounds were hydrated with  balanced salt solution.  The anterior chamber was inflated to a physiologic pressure with balanced salt solution. Vigamox 0.2 ml of a 1mg  per ml solution was injected into the anterior chamber for a dose of 0.2 mg of intracameral antibiotic at the completion of the case. Miostat was placed into the anterior chamber to constrict the pupil.  No wound leaks were noted.  Topical Vigamox drops and Maxitrol ointment were applied to the eye.  The patient was taken to the recovery room in stable condition without complications of anesthesia or surgery.  [name] 05/30/2016, 9:19 AM

## 2016-06-02 ENCOUNTER — Encounter: Payer: Self-pay | Admitting: Ophthalmology

## 2016-06-12 ENCOUNTER — Other Ambulatory Visit (INDEPENDENT_AMBULATORY_CARE_PROVIDER_SITE_OTHER): Payer: Self-pay | Admitting: Vascular Surgery

## 2016-06-12 DIAGNOSIS — N186 End stage renal disease: Secondary | ICD-10-CM

## 2016-06-12 DIAGNOSIS — T829XXD Unspecified complication of cardiac and vascular prosthetic device, implant and graft, subsequent encounter: Secondary | ICD-10-CM

## 2016-06-13 ENCOUNTER — Ambulatory Visit (INDEPENDENT_AMBULATORY_CARE_PROVIDER_SITE_OTHER): Payer: Medicare HMO | Admitting: Vascular Surgery

## 2016-06-13 ENCOUNTER — Encounter (INDEPENDENT_AMBULATORY_CARE_PROVIDER_SITE_OTHER): Payer: Self-pay

## 2016-06-13 ENCOUNTER — Ambulatory Visit (INDEPENDENT_AMBULATORY_CARE_PROVIDER_SITE_OTHER): Payer: Medicare HMO

## 2016-06-13 ENCOUNTER — Encounter (INDEPENDENT_AMBULATORY_CARE_PROVIDER_SITE_OTHER): Payer: Self-pay | Admitting: Vascular Surgery

## 2016-06-13 VITALS — BP 125/60 | HR 53 | Resp 16 | Wt 208.0 lb

## 2016-06-13 DIAGNOSIS — E1122 Type 2 diabetes mellitus with diabetic chronic kidney disease: Secondary | ICD-10-CM | POA: Diagnosis not present

## 2016-06-13 DIAGNOSIS — T829XXD Unspecified complication of cardiac and vascular prosthetic device, implant and graft, subsequent encounter: Secondary | ICD-10-CM

## 2016-06-13 DIAGNOSIS — N186 End stage renal disease: Secondary | ICD-10-CM | POA: Diagnosis not present

## 2016-06-13 DIAGNOSIS — Z992 Dependence on renal dialysis: Secondary | ICD-10-CM | POA: Diagnosis not present

## 2016-06-13 DIAGNOSIS — E785 Hyperlipidemia, unspecified: Secondary | ICD-10-CM | POA: Diagnosis not present

## 2016-06-13 DIAGNOSIS — Z794 Long term (current) use of insulin: Secondary | ICD-10-CM | POA: Diagnosis not present

## 2016-06-13 DIAGNOSIS — I10 Essential (primary) hypertension: Secondary | ICD-10-CM

## 2016-06-13 NOTE — Progress Notes (Signed)
MRN : [id number]  Mark [name]. is a 71 y.o. (Oct 27, 1945) male who presents with chief complaint of  Chief Complaint  Patient presents with  . Follow-up  .  History of Present Illness: Patient returns today in follow up of his dialysis access.  This has been working well. They have been doing a nice job at the dialysis access center of rotating his access sites. 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  Current Outpatient Prescriptions  Medication Sig Dispense Refill  . amLODipine (NORVASC) 10 MG tablet Take 10 mg by mouth daily.     [name] aspirin 325 MG tablet Take 325 mg by mouth daily.    . calcitRIOL (ROCALTROL) 0.25 MCG capsule Take 0.25 mcg by mouth daily.     . calcium acetate (PHOSLO) 667 MG capsule Take 1,334-2,001 mg by mouth See admin instructions. 3 caps with meals and 2 caps with snacks    . ENSURE PLUS (ENSURE PLUS) LIQD Take by mouth 3 (three) times a week. M,W.F    . fluticasone (FLONASE) 50 MCG/ACT nasal spray Place 1 spray into both nostrils daily as needed for allergies.     . furosemide (LASIX) 80 MG tablet Take 80-160 mg by mouth See admin instructions. 2 tablets in am 1 tablet in PM    . gabapentin (NEURONTIN) 100 MG capsule Take 1 capsule (100 mg total) by mouth 3 (three) times daily. (Patient taking differently: Take 100-200 mg by mouth at bedtime. 1 OR 2 HS) 90 capsule 0  . glimepiride (AMARYL) 4 MG tablet Take 8 mg by mouth at bedtime. 2 tablets at bedtime    . Isopropyl Alcohol (ALCOHOL PREP PADS EX)     . LEVEMIR FLEXTOUCH 100 UNIT/ML Pen Inject 30 Units into the skin daily at 10 pm.     . lovastatin (MEVACOR) 20 MG tablet Take 20 mg by mouth at bedtime.    . metoCLOPramide (REGLAN) 5 MG tablet Take 5 mg by  mouth See admin instructions. 1 TAB M,W,F 1TAB BID T,T,S,S    . metoprolol tartrate (LOPRESSOR) 25 MG tablet Take 25 mg by mouth See admin instructions. Pt takes Tues morning, Friday Night, and Saturday morning and evening    . multivitamin (RENA-VIT) TABS tablet Take 1 tablet by mouth daily.    . riTUXimab Z3007 (RITUXAN) 10 MG/ML injection Inject into the vein. EVERY 8 WEEKS AT [facility] CENTER    . ULTICARE SHORT PEN NEEDLES 31G X 8 MM MISC      No current facility-administered medications for this visit.     Past Medical History:  Diagnosis Date  . Anemia   . Arthritis   . Bladder cancer ([place])   . Chronic kidney disease   . Diabetes mellitus without complication ([place])   . Dialysis patient [facility])    M,W,F  . Dyspnea    DOE  . GERD (gastroesophageal reflux disease)   . HOH (hard of hearing)   . Hypertension   . IBS (irritable bowel syndrome)   . Lymphoma ([place]) 09/27/2014  . Neuropathy ([facility])    RIGHT LEG  . Stroke [facility])    TIA    Past Surgical History:  Procedure Laterality Date  . BACK SURGERY  2007  . CATARACT EXTRACTION W/PHACO Left 03/23/2016  Procedure: CATARACT EXTRACTION PHACO AND INTRAOCULAR LENS PLACEMENT (IOC);  Surgeon: Leandrew Koyanagi, MD;  Location: [facility] ORS;  Service: Ophthalmology;  Laterality: Left;  [place] 52.6 AP% 14.7 CDE 7.72 Fluid pack lot # [id number]  . CATARACT EXTRACTION W/PHACO Right 05/30/2016   Procedure: CATARACT EXTRACTION PHACO AND INTRAOCULAR LENS PLACEMENT ([place]);  Surgeon: Leandrew Koyanagi, MD;  Location: [facility] ORS;  Service: Ophthalmology;  Laterality: Right;  [place] 01:08 AP% 13.9 CDE 9.53  note: could not get IV in patient, so procedure done without anesthesia personell present, ok per Dr Wallace Going fluid pack lo t# [id number]  . CIRCUMCISION    . PERIPHERAL VASCULAR CATHETERIZATION Left 08/19/2015   Procedure: A/V Shuntogram/Fistulagram;  Surgeon: Algernon Huxley, MD;  Location: [place] [facility] LAB;  Service: Cardiovascular;  Laterality: Left;    . PERIPHERAL VASCULAR CATHETERIZATION N/A 08/19/2015   Procedure: A/V Shunt Intervention;  Surgeon: Algernon Huxley, MD;  Location: [place] [facility] LAB;  Service: Cardiovascular;  Laterality: N/A;    Social History Social History  Substance Use Topics  . Smoking status: Former Smoker    Types: Cigarettes    Quit date: 05/19/2013  . Smokeless tobacco: Never Used     Comment: quit  . Alcohol use No  No IVDU  Family History Family History  Problem Relation Age of Onset  . Kidney cancer Neg Hx   . Prostate cancer Neg Hx   . Kidney failure Neg Hx   . Bladder Cancer Neg Hx     Allergies  Allergen Reactions  . Ibuprofen     DUE TO DIALYSIS  . Multivitamin [Centrum]     DUE TO DIALYSIS  . Daypro [Oxaprozin] Swelling and Rash    Other reaction(s): Other (See Comments)     REVIEW OF SYSTEMS (Negative unless checked)  Constitutional: '[]' Weight loss  '[]' Fever  '[]' Chills Cardiac: '[]' Chest pain   '[]' Chest pressure   '[]' Palpitations   '[]' Shortness of breath when laying flat   '[]' Shortness of breath at rest   '[]' Shortness of breath with exertion. Vascular:  '[]' Pain in legs with walking   '[]' Pain in legs at rest   '[]' Pain in legs when laying flat   '[]' Claudication   '[]' Pain in feet when walking  '[]' Pain in feet at rest  '[]' Pain in feet when laying flat   '[]' History of DVT   '[]' Phlebitis   '[]' Swelling in legs   '[]' Varicose veins   '[]' Non-healing ulcers Pulmonary:   '[]' Uses home oxygen   '[]' Productive cough   '[]' Hemoptysis   '[]' Wheeze  '[]' COPD   '[]' Asthma Neurologic:  '[]' Dizziness  '[]' Blackouts   '[]' Seizures   '[x]' History of stroke   '[]' History of TIA  '[]' Aphasia   '[]' Temporary blindness   '[]' Dysphagia   '[]' Weakness or numbness in arms   '[x]' Weakness or numbness in legs Musculoskeletal:  '[]' Arthritis   '[]' Joint swelling   '[]' Joint pain   '[]' Low back pain Hematologic:  '[]' Easy bruising  '[]' Easy bleeding   '[]' Hypercoagulable state   '[]' Anemic   Gastrointestinal:  '[]' Blood in stool   '[]' Vomiting blood  '[]' Gastroesophageal reflux/heartburn    '[]' Abdominal pain Genitourinary:  '[x]' Chronic kidney disease   '[]' Difficult urination  '[]' Frequent urination  '[]' Burning with urination   '[]' Hematuria Skin:  '[]' Rashes   '[]' Ulcers   '[]' Wounds Psychological:  '[]' History of anxiety   '[]'  History of major depression.  Physical Examination  BP 125/60   Pulse (!) 53   Resp 16   Wt 208 lb (94.3 kg)   BMI 29.01 kg/m  Gen:  WD/WN, NAD Head: [place]/AT, No  temporalis wasting. Ear/Nose/Throat: Hearing grossly intact, nares w/o erythema or drainage, trachea midline Eyes: Conjunctiva clear. Sclera non-icteric Neck: Supple.  No JVD.  Pulmonary:  Good air movement, no use of accessory muscles.  Cardiac: RRR, normal S1, S2 Vascular: good thrill in left radiocephalic AVF.  Moderately aneurysmal but no skin threat Vessel Right Left  Radial Palpable Palpable                                   Gastrointestinal: soft, non-tender/non-distended. No guarding/reflex.  Musculoskeletal: in a wheelchair Neurologic: Sensation grossly intact in extremities.  Symmetrical.  Speech is fluent.  Psychiatric: Judgment intact, Mood & affect appropriate for pt's clinical situation. Dermatologic: No rashes or ulcers noted.  No cellulitis or open wounds. Lymph : No Cervical, Axillary, or Inguinal lymphadenopathy.      Labs Recent Results (from the past 2160 hour(s))  Glucose, capillary     Status: Abnormal   Collection Time: 03/23/16  8:26 AM  Result Value Ref Range   Glucose-Capillary 143 (H) 65 - 99 mg/dL  I-STAT 4, (NA,K, GLUC, HGB,HCT)     Status: Abnormal   Collection Time: 03/23/16  8:38 AM  Result Value Ref Range   Sodium 139 135 - 145 mmol/L   Potassium 4.4 3.5 - 5.1 mmol/L   Glucose, Bld 166 (H) 65 - 99 mg/dL   HCT 37.0 (L) 39.0 - 52.0 %   Hemoglobin 12.6 (L) 13.0 - 17.0 g/dL  Glucose, capillary     Status: Abnormal   Collection Time: 05/16/16 11:20 AM  Result Value Ref Range   Glucose-Capillary 51 (L) 65 - 99 mg/dL  CBC with Differential     Status:  Abnormal   Collection Time: 05/18/16  8:50 AM  Result Value Ref Range   WBC 4.7 3.8 - 10.6 K/uL   RBC 3.71 (L) 4.40 - 5.90 MIL/uL   Hemoglobin 12.2 (L) 13.0 - 18.0 g/dL   HCT 35.0 (L) 40.0 - 52.0 %   MCV 94.4 80.0 - 100.0 fL   MCH 32.8 26.0 - 34.0 pg   MCHC 34.7 32.0 - 36.0 g/dL   RDW 13.9 11.5 - 14.5 %   Platelets 116 (L) 150 - 440 K/uL   Neutrophils Relative % 54 %   Neutro Abs 2.5 1.4 - 6.5 K/uL   Lymphocytes Relative 35 %   Lymphs Abs 1.6 1.0 - 3.6 K/uL   Monocytes Relative 5 %   Monocytes Absolute 0.2 0.2 - 1.0 K/uL   Eosinophils Relative 5 %   Eosinophils Absolute 0.2 0 - 0.7 K/uL   Basophils Relative 1 %   Basophils Absolute 0.1 0 - 0.1 K/uL  Comprehensive metabolic panel     Status: Abnormal   Collection Time: 05/18/16  8:50 AM  Result Value Ref Range   Sodium 136 135 - 145 mmol/L   Potassium 4.2 3.5 - 5.1 mmol/L   Chloride 98 (L) 101 - 111 mmol/L   CO2 29 22 - 32 mmol/L   Glucose, Bld 133 (H) 65 - 99 mg/dL   BUN 35 (H) 6 - 20 mg/dL   Creatinine, Ser 6.20 (H) 0.61 - 1.24 mg/dL   Calcium 8.6 (L) 8.9 - 10.3 mg/dL   Total Protein 6.9 6.5 - 8.1 g/dL   Albumin 4.2 3.5 - 5.0 g/dL   AST 21 15 - 41 U/L   ALT 24 17 - 63 U/L   Alkaline Phosphatase 58  38 - 126 U/L   Total Bilirubin 0.6 0.3 - 1.2 mg/dL   GFR calc non Af Amer 8 (L) >60 mL/min   GFR calc Af Amer 9 (L) >60 mL/min    Comment: (NOTE) The eGFR has been calculated using the CKD EPI equation. This calculation has not been validated in all clinical situations. eGFR's persistently <60 mL/min signify possible Chronic Kidney Disease.    Anion gap 9 5 - 15  Lactate dehydrogenase     Status: None   Collection Time: 05/18/16  8:50 AM  Result Value Ref Range   LDH 143 98 - 192 U/L  Glucose, capillary     Status: Abnormal   Collection Time: 05/30/16  7:50 AM  Result Value Ref Range   Glucose-Capillary 120 (H) 65 - 99 mg/dL  I-STAT 4, (NA,K, GLUC, HGB,HCT)     Status: Abnormal   Collection Time: 05/30/16  8:08 AM    Result Value Ref Range   Sodium 137 135 - 145 mmol/L   Potassium 4.4 3.5 - 5.1 mmol/L   Glucose, Bld 121 (H) 65 - 99 mg/dL   HCT 35.0 (L) 39.0 - 52.0 %   Hemoglobin 11.9 (L) 13.0 - 17.0 g/dL    Radiology Nm Pet Image Restag (ps) Skull Base To Thigh  Result Date: 05/16/2016 CLINICAL DATA:  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 EXAM: NUCLEAR MEDICINE PET SKULL BASE TO THIGH TECHNIQUE: 12.8 mCi F-18 FDG was injected intravenously. Full-ring PET imaging was performed from the skull base to thigh after the radiotracer. CT data was obtained and used for attenuation correction and anatomic localization. FASTING BLOOD GLUCOSE:  Value: 51 mg/dl COMPARISON:  CT 03/07/2016, PET-CT report 2014 FINDINGS: NECK Enlarged LEFT level 2 lymph node on the LEFT measures 15 mm short axis (image 29, series 4 with SUV max equal 4.5. This increased from neck CT 05/19/2014 LEFT supraclavicular node measures 2.6 cm short axis with SUV max equal 4.6 CHEST Pneumomediastinum, paratracheal and prevascular lymph nodes are similar to comparison CT from 03/07/2016. RIGHT lower paratracheal lymph node measures 19 mm short axis unchanged. No suspicious pulmonary nodule ABDOMEN/PELVIS Liver is normal. Spleen is normal volume of increased metabolic activity. Enlarged bony lymph node in the RIGHT common iliac bifurcation measures 18 mm short axis with SUV max equal 4.6. This is new from 09/07/2015 which time the lesion measured 12 mm. Similar lymph node on image 210 Nonobstructing renal calculi. Benign-appearing cysts. Diverticulosis of colon. Normal prostate and SKELETON No focal hypermetabolic activity to suggest skeletal metastasis. IMPRESSION: 1. Mild Progression of chronic lymphocytic leukemia. Enlarged hypermetabolic LEFT level 2, level 3, and LEFT supraclavicular nodes. 2. Mild mediastinal lymphadenopathy is similar to most recent CT scan. 3. Interval enlargement mildly hypermetabolic RIGHT iliac lymph nodes  increased in size splenic 09/07/2015. 4. No splenomegaly or bone involvement. Electronically Signed   By: Suzy Bouchard M.D.   On: 05/16/2016 15:17      Assessment/Plan  Diabetes mellitus ([place]) blood glucose control important in reducing the progression of atherosclerotic disease. Also, involved in wound healing. On appropriate medications.   HLD (hyperlipidemia) lipid control important in reducing the progression of atherosclerotic disease. Continue statin therapy   Essential (primary) hypertension blood pressure control important in reducing the progression of atherosclerotic disease. On appropriate oral medications.   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  This fistula has required multiple previous interventions and remains moderately aneurysmal. There is no skin threat and no need for current interventions. Continue to use this access but rotating the sites of access as much as possible. Return to clinic in 6 months with duplex or sooner if problems develop in the interim.    Leotis Pain, MD  06/13/2016 3:27 PM    This note was created with Dragon medical transcription system.  Any errors from dictation are purely unintentional

## 2016-06-13 NOTE — Assessment & Plan Note (Signed)
blood pressure control important in reducing the progression of atherosclerotic disease. On appropriate oral medications.  

## 2016-06-13 NOTE — Assessment & Plan Note (Signed)
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  This fistula has required multiple previous interventions and remains moderately aneurysmal. There is no skin threat and no need for current interventions. Continue to use this access but rotating the sites of access as much as possible. Return to clinic in 6 months with duplex or sooner if problems develop in the interim.

## 2016-06-13 NOTE — Assessment & Plan Note (Signed)
lipid control important in reducing the progression of atherosclerotic disease. Continue statin therapy  

## 2016-06-13 NOTE — Assessment & Plan Note (Signed)
blood glucose control important in reducing the progression of atherosclerotic disease. Also, involved in wound healing. On appropriate medications.  

## 2016-07-04 ENCOUNTER — Ambulatory Visit (INDEPENDENT_AMBULATORY_CARE_PROVIDER_SITE_OTHER): Payer: Medicare HMO | Admitting: Podiatry

## 2016-07-04 ENCOUNTER — Encounter: Payer: Self-pay | Admitting: Podiatry

## 2016-07-04 DIAGNOSIS — M79609 Pain in unspecified limb: Secondary | ICD-10-CM

## 2016-07-04 DIAGNOSIS — L603 Nail dystrophy: Secondary | ICD-10-CM

## 2016-07-04 DIAGNOSIS — L608 Other nail disorders: Secondary | ICD-10-CM

## 2016-07-04 DIAGNOSIS — B351 Tinea unguium: Secondary | ICD-10-CM

## 2016-07-04 DIAGNOSIS — M79672 Pain in left foot: Secondary | ICD-10-CM

## 2016-07-04 DIAGNOSIS — M79671 Pain in right foot: Secondary | ICD-10-CM

## 2016-07-04 DIAGNOSIS — E0843 Diabetes mellitus due to underlying condition with diabetic autonomic (poly)neuropathy: Secondary | ICD-10-CM

## 2016-07-04 DIAGNOSIS — L851 Acquired keratosis [keratoderma] palmaris et plantaris: Secondary | ICD-10-CM

## 2016-07-04 DIAGNOSIS — L84 Corns and callosities: Secondary | ICD-10-CM

## 2016-07-04 MED ORDER — SILVER SULFADIAZINE 1 % EX CREA: 1.0000 "application " | TOPICAL_CREAM | Freq: Every day | CUTANEOUS | 2 refills | Status: DC

## 2016-07-06 NOTE — Progress Notes (Signed)
   SUBJECTIVE Patient with a history of diabetes mellitus presents to office today complaining of elongated, thickened nails. Pain while ambulating in shoes. Patient is unable to trim their own nails.   OBJECTIVE General Patient is awake, alert, and oriented x 3 and in no acute distress. Derm painful hyperkeratotic callus lesion also noted to the fifth MPJ left foot. Skin is dry and supple bilateral. Negative open lesions or macerations. Remaining integument unremarkable. Nails are tender, long, thickened and dystrophic with subungual debris, consistent with onychomycosis, 1-5 bilateral. No signs of infection noted. Vasc  DP and PT pedal pulses palpable bilaterally. Temperature gradient within normal limits.  Neuro Epicritic and protective threshold sensation diminished bilaterally.  Musculoskeletal Exam No symptomatic pedal deformities noted bilateral. Muscular strength within normal limits.  ASSESSMENT 1. Diabetes Mellitus w/ peripheral neuropathy 2. Onychomycosis of nail due to dermatophyte bilateral 3. Pain in foot bilateral 4. 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 Patient evaluated today. 2. Instructed to maintain good pedal hygiene and foot care. Stressed importance of controlling blood sugar.  3. Mechanical debridement of nails 1-5 bilaterally performed using a nail nipper. Filed with dremel without incident.  4. Excisional debridement of the callus lesion was performed using a chisel blade without incident. 5. Prescription for silver sulfadiazine cream 1%  6. Return to clinic in 3 mos.     Edrick Kins, DPM [facility]  Dr. Edrick Kins, [address]                                        [place], [place] [address]                Office [phone]  Fax [phone]

## 2016-07-11 ENCOUNTER — Other Ambulatory Visit: Payer: Medicare HMO

## 2016-08-01 ENCOUNTER — Ambulatory Visit: Payer: Medicare HMO | Admitting: Urology

## 2016-08-01 VITALS — BP 148/77 | HR 51 | Ht 70.5 in | Wt 208.0 lb

## 2016-08-01 DIAGNOSIS — Z8551 Personal history of malignant neoplasm of bladder: Secondary | ICD-10-CM | POA: Diagnosis not present

## 2016-08-01 DIAGNOSIS — N281 Cyst of kidney, acquired: Secondary | ICD-10-CM

## 2016-08-01 MED ORDER — LIDOCAINE HCL 2 % EX GEL
1.0000 "application " | Freq: Once | CUTANEOUS | Status: AC
  Administered 2016-08-01: 1 via URETHRAL

## 2016-08-01 MED ORDER — CIPROFLOXACIN HCL 500 MG PO TABS
500.0000 mg | ORAL_TABLET | Freq: Once | ORAL | Status: AC
  Administered 2016-08-01: 500 mg via ORAL

## 2016-08-01 NOTE — Addendum Note (Signed)
Addended by: Wilson Singer on: 08/01/2016 04:27 PM   Modules accepted: Orders

## 2016-08-01 NOTE — Progress Notes (Signed)
08/01/2016 10:19 AM   Mark [name]. June 12, 1945 [id number]  Referring provider: Marden Noble, MD [address] [place], [place] [address]  No chief complaint on file.   HPI: 1 - Bladder Cancer -  2015 - high-grade superficial bladder cancer s/p TURBT and induction BCG, compliant with cysto surveillance:  Recent sumarized course: 01/2015 - cysto NED 07/2015 - cysto NED; 01/2016 cysto NED 07/2016 - cysto NED  2 - End Stage Renal Disease - on IHD via left arm AVF MWF for ESRD. Still makes some urine voids about once per day.  3 - Right Non-Complex Renal Cyst - incidental Rt cyst by CT 2017, no complex features or mass effect.  Today "Mark [name]" is seen for cysto surveillance of bladder cancer No interval gross hematuria or dialysis issues.      PMH: Past Medical History:  Diagnosis Date  . Anemia   . Arthritis   . Bladder cancer ([place])   . Chronic kidney disease   . Diabetes mellitus without complication ([place])   . Dialysis patient [facility])    M,W,F  . Dyspnea    DOE  . GERD (gastroesophageal reflux disease)   . HOH (hard of hearing)   . Hypertension   . IBS (irritable bowel syndrome)   . Lymphoma ([place]) 09/27/2014  . Neuropathy    RIGHT LEG  . Stroke [facility])    TIA    Surgical History: Past Surgical History:  Procedure Laterality Date  . BACK SURGERY  2007  . CATARACT EXTRACTION W/PHACO Left 03/23/2016   Procedure: CATARACT EXTRACTION PHACO AND INTRAOCULAR LENS PLACEMENT (IOC);  Surgeon: Leandrew Koyanagi, MD;  Location: [facility] ORS;  Service: Ophthalmology;  Laterality: Left;  [place] 52.6 AP% 14.7 CDE 7.72 Fluid pack lot # [id number]  . CATARACT EXTRACTION W/PHACO Right 05/30/2016   Procedure: CATARACT EXTRACTION PHACO AND INTRAOCULAR LENS PLACEMENT ([place]);  Surgeon: Leandrew Koyanagi, MD;  Location: [facility] ORS;  Service: Ophthalmology;  Laterality: Right;  [place] 01:08 AP% 13.9 CDE 9.53  note: could not get IV in patient, so procedure done without anesthesia  personell present, ok per Dr Wallace Going fluid pack lo t# [id number]  . CIRCUMCISION    . PERIPHERAL VASCULAR CATHETERIZATION Left 08/19/2015   Procedure: A/V Shuntogram/Fistulagram;  Surgeon: Algernon Huxley, MD;  Location: [place] CV LAB;  Service: Cardiovascular;  Laterality: Left;  . PERIPHERAL VASCULAR CATHETERIZATION N/A 08/19/2015   Procedure: A/V Shunt Intervention;  Surgeon: Algernon Huxley, MD;  Location: [place] CV LAB;  Service: Cardiovascular;  Laterality: N/A;    Home Medications:  Allergies as of 08/01/2016      Reactions   Ibuprofen    DUE TO DIALYSIS   Multivitamin [centrum]    DUE TO DIALYSIS   Daypro [oxaprozin] Swelling, Rash   Other reaction(s): Other (See Comments)      Medication List       Accurate as of 08/01/16 10:19 AM. Always use your most recent med list.          ALCOHOL PREP PADS EX   amLODipine 10 MG tablet Commonly known as:  NORVASC Take 10 mg by mouth daily.   aspirin 325 MG tablet Take 325 mg by mouth daily.   calcitRIOL 0.25 MCG capsule Commonly known as:  ROCALTROL Take 0.25 mcg by mouth daily.   calcium acetate 667 MG capsule Commonly known as:  PHOSLO Take 1,334-2,001 mg by mouth See admin instructions. 3 caps with meals and 2 caps with snacks   ENSURE PLUS  Liqd Take by mouth 3 (three) times a week. M,W.F   fluticasone 50 MCG/ACT nasal spray Commonly known as:  FLONASE Place 1 spray into both nostrils daily as needed for allergies.   furosemide 80 MG tablet Commonly known as:  LASIX Take 80-160 mg by mouth See admin instructions. 2 tablets in am 1 tablet in PM   gabapentin 100 MG capsule Commonly known as:  NEURONTIN Take 1 capsule (100 mg total) by mouth 3 (three) times daily.   glimepiride 4 MG tablet Commonly known as:  AMARYL Take 8 mg by mouth at bedtime. 2 tablets at bedtime   LEVEMIR FLEXTOUCH 100 UNIT/ML Pen Generic drug:  Insulin Detemir Inject 30 Units into the skin daily at 10 pm.   lovastatin 20 MG  tablet Commonly known as:  MEVACOR Take 20 mg by mouth at bedtime.   metoCLOPramide 5 MG tablet Commonly known as:  REGLAN Take 5 mg by mouth See admin instructions. 1 TAB M,W,F 1TAB BID T,T,S,S   metoprolol tartrate 25 MG tablet Commonly known as:  LOPRESSOR Take 25 mg by mouth See admin instructions. Pt takes Tues morning, Friday Night, and Saturday morning and evening   multivitamin Tabs tablet Take 1 tablet by mouth daily.   riTUXimab [id number] 10 MG/ML injection Commonly known as:  RITUXAN Inject into the vein. EVERY 8 WEEKS AT CANCER CENTER   silver sulfADIAZINE 1 % cream Commonly known as:  SILVADENE Apply 1 application topically daily.   ULTICARE SHORT PEN NEEDLES 31G X 8 MM Misc Generic drug:  Insulin Pen Needle       Allergies:  Allergies  Allergen Reactions  . Ibuprofen     DUE TO DIALYSIS  . Multivitamin [Centrum]     DUE TO DIALYSIS  . Daypro [Oxaprozin] Swelling and Rash    Other reaction(s): Other (See Comments)    Family History: Family History  Problem Relation Age of Onset  . Kidney cancer Neg Hx   . Prostate cancer Neg Hx   . Kidney failure Neg Hx   . Bladder Cancer Neg Hx     Social History:  reports that he quit smoking about 3 years ago. His smoking use included Cigarettes. He has never used smokeless tobacco. He reports that he does not drink alcohol or use drugs.   Review of Systems  Gastrointestinal (upper)  : Negative for upper GI symptoms  Gastrointestinal (lower) : Negative for lower GI symptoms  Constitutional : Negative for symptoms  Skin: Negative for skin symptoms  Eyes: Negative for eye symptoms  Ear/Nose/Throat : Negative for Ear/Nose/Throat symptoms  Hematologic/Lymphatic: Negative for Hematologic/Lymphatic symptoms  Cardiovascular : Negative for cardiovascular symptoms  Respiratory : Negative for respiratory symptoms  Endocrine: Negative for endocrine symptoms  Musculoskeletal: Negative for  musculoskeletal symptoms  Neurological: Negative for neurological symptoms  Psychologic: Negative for psychiatric symptoms   Physical Exam: There were no vitals taken for this visit.  Constitutional:  Alert and oriented, No acute distress. HEENT: [place] AT, moist mucus membranes.  Trachea midline, no masses. Cardiovascular: No clubbing, cyanosis, or edema. Respiratory: Normal respiratory effort, no increased work of breathing. GI: Abdomen is soft, nontender, nondistended, no abdominal masses GU: No CVA tenderness.  Skin: No rashes, bruises or suspicious lesions. Lymph: No cervical or inguinal adenopathy. Neurologic: Grossly intact, no focal deficits, moving all 4 extremities. Psychiatric: Normal mood and affect. LUE AVR with thrill. No LE edema.   Laboratory Data: Lab Results  Component Value Date   WBC 4.7 05/18/2016  HGB 11.9 (L) 05/30/2016   HCT 35.0 (L) 05/30/2016   MCV 94.4 05/18/2016   PLT 116 (L) 05/18/2016    Lab Results  Component Value Date   CREATININE 6.20 (H) 05/18/2016    No results found for: PSA  No results found for: TESTOSTERONE  Lab Results  Component Value Date   HGBA1C 9.3 (H) 10/16/2013    Urinalysis    Component Value Date/Time   COLORURINE Yellow 10/20/2013 1835   APPEARANCEUR Clear 01/06/2016 1433   LABSPEC 1.014 10/20/2013 1835   PHURINE 6.0 10/20/2013 1835   GLUCOSEU Negative 01/06/2016 1433   GLUCOSEU 150 mg/dL 10/20/2013 1835   HGBUR Negative 10/20/2013 1835   BILIRUBINUR Negative 01/06/2016 1433   BILIRUBINUR Negative 10/20/2013 1835   KETONESUR Negative 10/20/2013 1835   PROTEINUR 3+ (A) 01/06/2016 1433   PROTEINUR 100 mg/dL 10/20/2013 1835   NITRITE Negative 01/06/2016 1433   NITRITE Negative 10/20/2013 1835   LEUKOCYTESUR Negative 01/06/2016 1433   LEUKOCYTESUR 1+ 10/20/2013 1835     Cystoscopy Procedure Note  Patient identification was confirmed, informed consent was obtained, and patient was prepped using Betadine  solution.  Lidocaine jelly was administered per urethral meatus.    Preoperative abx where received prior to procedure.     Pre-Procedure: - Inspection reveals a normal caliber ureteral meatus.  Procedure: The flexible cystoscope was introduced without difficulty - No urethral strictures/lesions are present. - Enlarged prostate  - Normal bladder neck - Bilateral ureteral orifices identified - Bladder mucosa  reveals no ulcers, tumors, or lesions - No bladder stones - No trabeculation  Retroflexion shows no intravesical lobe   Post-Procedure: - Patient tolerated the procedure well    Assessment & Plan:    1. History of bladder cancer - no recurrence by cysto today. continue Q66mo surveillance until 2019, then q yearly.   2. End-stage renal disease on hemodialysis (HCC) - severe medical renal disease, doing well on IHD.   3. Renal cyst, right, on-complex - no indicaiton for further evaluation or surveillance, observe.   RTC 33mos for suv cysto, then transition to Q yearly.   Alexis Frock, [facility] [address], [place], [place] [address] [phone]

## 2016-08-17 ENCOUNTER — Inpatient Hospital Stay: Payer: Medicare HMO | Attending: Internal Medicine

## 2016-08-17 ENCOUNTER — Inpatient Hospital Stay (HOSPITAL_BASED_OUTPATIENT_CLINIC_OR_DEPARTMENT_OTHER): Payer: Medicare HMO | Admitting: Internal Medicine

## 2016-08-17 VITALS — BP 149/63 | HR 62 | Temp 97.2°F | Resp 18 | Ht 70.5 in | Wt 208.0 lb

## 2016-08-17 DIAGNOSIS — K589 Irritable bowel syndrome without diarrhea: Secondary | ICD-10-CM | POA: Diagnosis not present

## 2016-08-17 DIAGNOSIS — Z87891 Personal history of nicotine dependence: Secondary | ICD-10-CM | POA: Insufficient documentation

## 2016-08-17 DIAGNOSIS — M21371 Foot drop, right foot: Secondary | ICD-10-CM | POA: Insufficient documentation

## 2016-08-17 DIAGNOSIS — E1122 Type 2 diabetes mellitus with diabetic chronic kidney disease: Secondary | ICD-10-CM | POA: Diagnosis not present

## 2016-08-17 DIAGNOSIS — Z8673 Personal history of transient ischemic attack (TIA), and cerebral infarction without residual deficits: Secondary | ICD-10-CM | POA: Diagnosis not present

## 2016-08-17 DIAGNOSIS — K219 Gastro-esophageal reflux disease without esophagitis: Secondary | ICD-10-CM | POA: Insufficient documentation

## 2016-08-17 DIAGNOSIS — I2510 Atherosclerotic heart disease of native coronary artery without angina pectoris: Secondary | ICD-10-CM | POA: Diagnosis not present

## 2016-08-17 DIAGNOSIS — Z79899 Other long term (current) drug therapy: Secondary | ICD-10-CM | POA: Diagnosis not present

## 2016-08-17 DIAGNOSIS — N186 End stage renal disease: Secondary | ICD-10-CM | POA: Insufficient documentation

## 2016-08-17 DIAGNOSIS — R911 Solitary pulmonary nodule: Secondary | ICD-10-CM

## 2016-08-17 DIAGNOSIS — D696 Thrombocytopenia, unspecified: Secondary | ICD-10-CM | POA: Diagnosis not present

## 2016-08-17 DIAGNOSIS — Z992 Dependence on renal dialysis: Secondary | ICD-10-CM | POA: Diagnosis not present

## 2016-08-17 DIAGNOSIS — C8303 Small cell B-cell lymphoma, intra-abdominal lymph nodes: Secondary | ICD-10-CM

## 2016-08-17 DIAGNOSIS — R599 Enlarged lymph nodes, unspecified: Secondary | ICD-10-CM | POA: Insufficient documentation

## 2016-08-17 DIAGNOSIS — Z794 Long term (current) use of insulin: Secondary | ICD-10-CM | POA: Diagnosis not present

## 2016-08-17 DIAGNOSIS — R748 Abnormal levels of other serum enzymes: Secondary | ICD-10-CM | POA: Diagnosis not present

## 2016-08-17 DIAGNOSIS — Z7982 Long term (current) use of aspirin: Secondary | ICD-10-CM | POA: Diagnosis not present

## 2016-08-17 DIAGNOSIS — I120 Hypertensive chronic kidney disease with stage 5 chronic kidney disease or end stage renal disease: Secondary | ICD-10-CM | POA: Diagnosis not present

## 2016-08-17 LAB — HEPATIC FUNCTION PANEL
ALT: 21 U/L (ref 17–63)
AST: 22 U/L (ref 15–41)
Albumin: 4.3 g/dL (ref 3.5–5.0)
Alkaline Phosphatase: 68 U/L (ref 38–126)
Bilirubin, Direct: <0.1 mg/dL — ABNORMAL LOW (ref 0.1–0.5)
TOTAL PROTEIN: 7 g/dL (ref 6.5–8.1)
Total Bilirubin: 0.6 mg/dL (ref 0.3–1.2)

## 2016-08-17 LAB — BASIC METABOLIC PANEL
Anion gap: 9 (ref 5–15)
BUN: 26 mg/dL — ABNORMAL HIGH (ref 6–20)
CHLORIDE: 98 mmol/L — AB (ref 101–111)
CO2: 30 mmol/L (ref 22–32)
CREATININE: 5.98 mg/dL — AB (ref 0.61–1.24)
Calcium: 9.1 mg/dL (ref 8.9–10.3)
GFR calc non Af Amer: 8 mL/min — ABNORMAL LOW (ref >60)
GFR, EST AFRICAN AMERICAN: 10 mL/min — AB (ref >60)
Glucose, Bld: 167 mg/dL — ABNORMAL HIGH (ref 65–99)
POTASSIUM: 4.1 mmol/L (ref 3.5–5.1)
SODIUM: 137 mmol/L (ref 135–145)

## 2016-08-17 LAB — CBC WITH DIFFERENTIAL/PLATELET
BASOS PCT: 1 %
Basophils Absolute: 0 10*3/uL (ref 0–0.1)
EOS ABS: 0.2 10*3/uL (ref 0–0.7)
Eosinophils Relative: 5 %
HCT: 34.5 % — ABNORMAL LOW (ref 40.0–52.0)
HEMOGLOBIN: 12.1 g/dL — AB (ref 13.0–18.0)
LYMPHS ABS: 2 10*3/uL (ref 1.0–3.6)
Lymphocytes Relative: 40 %
MCH: 33.1 pg (ref 26.0–34.0)
MCHC: 35.1 g/dL (ref 32.0–36.0)
MCV: 94.4 fL (ref 80.0–100.0)
Monocytes Absolute: 0.2 10*3/uL (ref 0.2–1.0)
Monocytes Relative: 3 %
NEUTROS ABS: 2.6 10*3/uL (ref 1.4–6.5)
Neutrophils Relative %: 51 %
Platelets: 104 10*3/uL — ABNORMAL LOW (ref 150–440)
RBC: 3.66 MIL/uL — AB (ref 4.40–5.90)
RDW: 14.3 % (ref 11.5–14.5)
WBC: 5 10*3/uL (ref 3.8–10.6)

## 2016-08-17 LAB — LACTATE DEHYDROGENASE: LDH: 139 U/L (ref 98–192)

## 2016-08-17 NOTE — Assessment & Plan Note (Addendum)
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 Worsening left neck LN; concerns for progression in neck. FEB 2018- PET scan confirms mild progression in neck; pelvis [increase in right pelvic LN]; stable mediastinal LN. However patient continues to be asymptomatic-except for mild thrombocytopenia [C discussion below].  #Mild Platelets today 104; hemoglobin 12 /white count normal. Reviewed with pt/daughter. No indications for treatment at this time. Given his multiple comorbidities- Rituxan single agent might be a good option especially if his CLL low platelets is the only reason to treat.   #  patient will follow-up with me in 3 months labs.   CC: Dr.Voora; nephrology.

## 2016-08-17 NOTE — Progress Notes (Signed)
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 Bone marrow biopsy on 03/05/13 with mildly hypercellular marrow for age 71-50% with interstitial predominantly small B-lymphocytic infiltrate estimated about 20-30% of marrow cells, mild nonspecific dyserythropoiesis, storage iron present this. Flow cytometry showed 29% CD5+ clonal B-cell population which is CD45+, CD5+, CD19+, CD20+, CD22+, CD23+, CD38+, HLA-DR+, Slg kappa+, and is CD10-, CD11b-, FMC7-. 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 PET scan on 03/03/13 with extensive hypermetabolic lymphadenopathy.  (presented with progressive Thrombocytopenia with persistent Anemia. CBC on 01/16/13 showed hemoglobin 10.0, MCV 98, platelets 80, WBC 4890 with 40% neutrophils, 54% lymphocytes, 1.4% monocytes. On Epogen and Venofer at dialysis treatments. Further workup showed small M-spike of 0.2).   Got Treanda/Rituxan x2 cycles in Dec 2014/Jan 2015. Then on single agent Rituxan q 8 weeks (got #3 dose on 09/16/13).  # DEC 2014- SMALL CELL LYMPHOMA/CLL- STAGE IV [BMBx- 03/2013- 20-30% B cell infiltrate; CD 5+/CD 20+]; FISH- Trisomy 12;DEC 2014-Treanda-Rituxan x2 [held sec to prolonged hospital/pneumoani/HD]; Rituxa q8W [last June 2015; Held sec to Superficial Bladder ca]; CT- JUNE 2017- STABLE Mediastinal LN/supraclav LN/pelvic LN; CT DEC 2017- mild progression noted ~1.5-2cm mediastinal/Ax LN. 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   Patient denies any complications from his dialysis so far. He is tolerating dialysis fairly well. Denies any bleeding episodes. Patient states his platelets have been about 100 while being checked in dialysis on a weekly basis.  Patient denies any unusual weight loss or night sweats. Denies any new lumps or bumps any where else. .  Denies any shortness of breath or cough. No bleeding.  REVIEW OF SYSTEMS:  A complete 10 point review of system is done which is negative except mentioned above/history of present illness.   PAST MEDICAL HISTORY :  Past Medical History:  Diagnosis Date  . Anemia   . Arthritis   . Bladder cancer ([place]   . Chronic kidney disease   . Diabetes mellitus without complication ([place]   . Dialysis patient ([facility]    M,W,F  . Dyspnea    DOE  . GERD (gastroesophageal reflux disease)   . HOH (hard of hearing)   . Hypertension   . IBS (irritable bowel syndrome)   . Lymphoma ([place] 09/27/2014  . Neuropathy    RIGHT LEG  . Stroke ([facility]    TIA    PAST SURGICAL HISTORY :   Past Surgical History:  Procedure Laterality Date  . BACK SURGERY  2007  . CATARACT EXTRACTION W/PHACO Left 03/23/2016   Procedure: CATARACT EXTRACTION PHACO AND INTRAOCULAR LENS PLACEMENT (IOC);  Surgeon: CLeandrew Koyanagi MD;  Location: [facility] ORS;  Service: Ophthalmology;  Laterality: Left;  [place]52.6 AP% 14.7 CDE 7.72 Fluid pack lot # [id number]  . CATARACT EXTRACTION W/PHACO Right 05/30/2016   Procedure: CATARACT EXTRACTION PHACO AND INTRAOCULAR LENS PLACEMENT ([place];  Surgeon: CLeandrew Koyanagi MD;  Location: [facility] ORS;  Service: Ophthalmology;  Laterality: Right;  [place] 01:08 AP% 13.9 CDE 9.53  note: could not get IV in patient, so procedure done without anesthesia personell present, ok per Dr Wallace Going fluid pack lo t# [id number]  . CIRCUMCISION    . PERIPHERAL VASCULAR CATHETERIZATION Left 08/19/2015    Procedure: A/V Shuntogram/Fistulagram;  Surgeon: Algernon Huxley, MD;  Location: [place] CV LAB;  Service: Cardiovascular;  Laterality: Left;  . PERIPHERAL VASCULAR CATHETERIZATION N/A 08/19/2015   Procedure: A/V Shunt Intervention;  Surgeon: Algernon Huxley, MD;  Location: [place] CV LAB;  Service: Cardiovascular;  Laterality: N/A;    FAMILY HISTORY :   Family History  Problem Relation Age of Onset  . Kidney cancer Neg Hx   . Prostate cancer Neg Hx   . Kidney failure Neg Hx   . Bladder Cancer Neg Hx     SOCIAL HISTORY:   Social History  Substance Use Topics  . Smoking status: Former Smoker    Types: Cigarettes    Quit date: 05/19/2013  . Smokeless tobacco: Never Used     Comment: quit  . Alcohol use No    ALLERGIES:  is allergic to ibuprofen; multivitamin [centrum]; and daypro [oxaprozin].  MEDICATIONS:  Current Outpatient Prescriptions  Medication Sig Dispense Refill  . amLODipine (NORVASC) 10 MG tablet Take 10 mg by mouth daily.     [name] aspirin 325 MG tablet Take 325 mg by mouth daily.    . calcitRIOL (ROCALTROL) 0.25 MCG capsule Take 0.25 mcg by mouth daily.     . calcium acetate (PHOSLO) 667 MG capsule Take 1,334-2,001 mg by mouth See admin instructions. 3 caps with meals and 2 caps with snacks    . ENSURE PLUS (ENSURE PLUS) LIQD Take by mouth 3 (three) times a week. M,W.F    . fluticasone (FLONASE) 50 MCG/ACT nasal spray Place 1 spray into both nostrils daily as needed for allergies.     . furosemide (LASIX) 80 MG tablet Take 80-160 mg by mouth See admin instructions. 2 tablets in am 1 tablet in PM    . gabapentin (NEURONTIN) 100 MG capsule Take 1 capsule (100 mg total) by mouth 3 (three) times daily. (Patient taking differently: Take 100-200 mg by mouth at bedtime. 1 OR 2 HS) 90 capsule 0  . glimepiride (AMARYL) 4 MG tablet Take 8 mg by mouth at bedtime. 2 tablets at bedtime    . Isopropyl Alcohol (ALCOHOL PREP PADS EX)     . LEVEMIR FLEXTOUCH 100 UNIT/ML Pen Inject 30  Units into the skin daily at 10 pm.     . lovastatin (MEVACOR) 20 MG tablet Take 20 mg by mouth at bedtime.    . metoCLOPramide (REGLAN) 5 MG tablet Take 5 mg by mouth See admin instructions. 1 TAB M,W,F 1TAB BID T,T,S,S    . metoprolol tartrate (LOPRESSOR) 25 MG tablet Take 25 mg by mouth See admin instructions. Pt takes Tues morning, Friday Night, and Saturday morning and evening    . multivitamin (RENA-VIT) TABS tablet Take 1 tablet by mouth daily.    [name] neomycin-polymyxin b-dexamethasone (MAXITROL) 3.5-10000-0.1 OINT Place 1 application into the left eye daily.    . silver sulfADIAZINE (SILVADENE) 1 % cream Apply 1 application topically daily. 30 g 2  . ULTICARE SHORT PEN NEEDLES 31G X 8 MM MISC      No current facility-administered medications for this visit.     PHYSICAL EXAMINATION: ECOG PERFORMANCE STATUS: 3  BP (!) 149/63 (BP Location: Right  Arm, Patient Position: Sitting)   Pulse 62   Temp 97.2 F (36.2 C) (Tympanic)   Resp 18   Ht 5' 10.5" (1.791 m)   Wt 208 lb (94.3 kg)   BMI 29.42 kg/m   Filed Weights   08/17/16 1050  Weight: 208 lb (94.3 kg)    GENERAL: Well-nourished well-developed; Alert, no distress and comfortable.  Accompanied by daughter; pt is in a wheel chair. EYES: no pallor or icterus OROPHARYNX: no thrush or ulceration; poor dentition.  NECK: supple, no masses felt LYMPH:  no palpable lymphadenopathy in the axillary or inguinal regions. 2-3 cm LN felet in neck.  LUNGS: clear to auscultation and  No wheeze or crackles HEART/CVS: regular rate & rhythm and no murmurs; No lower extremity edema;  R-LE in brace.  ABDOMEN:abdomen soft, non-tender and normal bowel sounds Musculoskeletal:no cyanosis of digits and no clubbing  PSYCH: alert & oriented x 3 with fluent speech NEURO: no focal motor/sensory deficits SKIN:  no rashes or significant lesions  LABORATORY DATA:  I have reviewed the data as listed    Component Value Date/Time   NA 137 08/17/2016  1015   NA 140 11/18/2013 1030   K 4.1 08/17/2016 1015   K 4.0 11/18/2013 1030   CL 98 (L) 08/17/2016 1015   CL 97 (L) 11/18/2013 1030   CO2 30 08/17/2016 1015   CO2 33 (H) 11/18/2013 1030   GLUCOSE 167 (H) 08/17/2016 1015   GLUCOSE 265 (H) 11/18/2013 1030   BUN 26 (H) 08/17/2016 1015   BUN 30 (H) 11/18/2013 1030   CREATININE 5.98 (H) 08/17/2016 1015   CREATININE 5.05 (H) 11/18/2013 1030   CALCIUM 9.1 08/17/2016 1015   CALCIUM 8.6 11/18/2013 1030   PROT 7.0 08/17/2016 1015   PROT 7.0 11/18/2013 1030   ALBUMIN 4.3 08/17/2016 1015   ALBUMIN 3.7 11/18/2013 1030   AST 22 08/17/2016 1015   AST 15 11/18/2013 1030   ALT 21 08/17/2016 1015   ALT 25 11/18/2013 1030   ALKPHOS 68 08/17/2016 1015   ALKPHOS 105 11/18/2013 1030   BILITOT 0.6 08/17/2016 1015   BILITOT 0.3 11/18/2013 1030   GFRNONAA 8 (L) 08/17/2016 1015   GFRNONAA 11 (L) 11/18/2013 1030   GFRAA 10 (L) 08/17/2016 1015   GFRAA 13 (L) 11/18/2013 1030    No results found for: SPEP, UPEP  Lab Results  Component Value Date   WBC 5.0 08/17/2016   NEUTROABS 2.6 08/17/2016   HGB 12.1 (L) 08/17/2016   HCT 34.5 (L) 08/17/2016   MCV 94.4 08/17/2016   PLT 104 (L) 08/17/2016      Chemistry      Component Value Date/Time   NA 137 08/17/2016 1015   NA 140 11/18/2013 1030   K 4.1 08/17/2016 1015   K 4.0 11/18/2013 1030   CL 98 (L) 08/17/2016 1015   CL 97 (L) 11/18/2013 1030   CO2 30 08/17/2016 1015   CO2 33 (H) 11/18/2013 1030   BUN 26 (H) 08/17/2016 1015   BUN 30 (H) 11/18/2013 1030   CREATININE 5.98 (H) 08/17/2016 1015   CREATININE 5.05 (H) 11/18/2013 1030      Component Value Date/Time   CALCIUM 9.1 08/17/2016 1015   CALCIUM 8.6 11/18/2013 1030   ALKPHOS 68 08/17/2016 1015   ALKPHOS 105 11/18/2013 1030   AST 22 08/17/2016 1015   AST 15 11/18/2013 1030   ALT 21 08/17/2016 1015   ALT 25 11/18/2013 1030   BILITOT 0.6 08/17/2016 1015  BILITOT 0.3 11/18/2013 1030     IIMPRESSION: 1. Mild Progression of  chronic lymphocytic leukemia. Enlarged hypermetabolic LEFT level 2, level 3, and LEFT supraclavicular nodes. 2. Mild mediastinal lymphadenopathy is similar to most recent CT scan. 3. Interval enlargement mildly hypermetabolic RIGHT iliac lymph nodes increased in size splenic 09/07/2015. 4. No splenomegaly or bone involvement.   Electronically Signed   By: Suzy Bouchard M.D.   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 Worsening left neck LN; concerns for progression in neck. FEB 2018- PET scan confirms mild progression in neck; pelvis [increase in right pelvic LN]; stable mediastinal LN. However patient continues to be asymptomatic-except for mild thrombocytopenia [C discussion below].  #Mild Platelets today 116 hemoglobin 12 /white count normal. Reviewed with pt/daughter. No indications for treatment at this time. Given his multiple comorbidities- Rituxan single agent might be a good option especially if his CLL low platelets is the only reason to treat.   #  patient will follow-up with me in 3 months labs.   CC: Dr.Voora; nephrology.    Orders Placed This Encounter  Procedures  . Basic metabolic panel    Standing Status:   Future    Standing Expiration Date:   08/17/2017  . Lactate dehydrogenase    Standing Status:   Future    Standing Expiration Date:   08/17/2017       Cammie Sickle, MD 08/20/2016 6:25 PM

## 2016-08-18 LAB — HEPATITIS B SURFACE ANTIGEN: Hepatitis B Surface Ag: NEGATIVE

## 2016-08-18 LAB — HEPATITIS B CORE ANTIBODY, IGM: Hep B C IgM: NEGATIVE

## 2016-09-05 ENCOUNTER — Ambulatory Visit (INDEPENDENT_AMBULATORY_CARE_PROVIDER_SITE_OTHER): Payer: Medicare HMO | Admitting: Podiatry

## 2016-09-05 ENCOUNTER — Encounter: Payer: Self-pay | Admitting: Podiatry

## 2016-09-05 DIAGNOSIS — L84 Corns and callosities: Secondary | ICD-10-CM

## 2016-09-05 DIAGNOSIS — B351 Tinea unguium: Secondary | ICD-10-CM

## 2016-09-05 DIAGNOSIS — E0842 Diabetes mellitus due to underlying condition with diabetic polyneuropathy: Secondary | ICD-10-CM

## 2016-09-05 DIAGNOSIS — M79676 Pain in unspecified toe(s): Secondary | ICD-10-CM | POA: Diagnosis not present

## 2016-09-06 NOTE — Progress Notes (Signed)
   SUBJECTIVE Patient with a history of diabetes mellitus presents to office today complaining of elongated, thickened nails. Pain while ambulating in shoes. Patient is unable to trim their own nails.   OBJECTIVE General Patient is awake, alert, and oriented x 3 and in no acute distress. Derm painful hyperkeratotic callus lesion also noted to the fifth MPJ left foot. Skin is dry and supple bilateral. Negative open lesions or macerations. Remaining integument unremarkable. Nails are tender, long, thickened and dystrophic with subungual debris, consistent with onychomycosis, 1-5 bilateral. No signs of infection noted. Vasc  DP and PT pedal pulses palpable bilaterally. Temperature gradient within normal limits.  Neuro Epicritic and protective threshold sensation diminished bilaterally.  Musculoskeletal Exam No symptomatic pedal deformities noted bilateral. Muscular strength within normal limits.  ASSESSMENT 1. Diabetes Mellitus w/ peripheral neuropathy 2. Onychomycosis of nail due to dermatophyte bilateral 3. Pain in foot bilateral 4. 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 Patient evaluated today. 2. Instructed to maintain good pedal hygiene and foot care. Stressed importance of controlling blood sugar.  3. Mechanical debridement of nails 1-5 bilaterally performed using a nail nipper. Filed with dremel without incident.  4. Excisional debridement of the callus lesion was performed using a chisel blade without incident. 5. Return to clinic in 3 mos.     Edrick Kins, DPM [facility]  Dr. Edrick Kins, [place]                                        [place], [place] [address]                Office [phone]  Fax [phone]

## 2016-09-07 ENCOUNTER — Ambulatory Visit: Payer: Medicare HMO | Admitting: Internal Medicine

## 2016-09-07 ENCOUNTER — Other Ambulatory Visit: Payer: Medicare HMO

## 2016-10-03 ENCOUNTER — Ambulatory Visit: Payer: Medicare HMO | Admitting: Podiatry

## 2016-10-19 ENCOUNTER — Other Ambulatory Visit: Payer: Medicare HMO

## 2016-10-19 ENCOUNTER — Ambulatory Visit: Payer: Medicare HMO | Admitting: Internal Medicine

## 2016-10-20 ENCOUNTER — Emergency Department: Payer: Medicare HMO

## 2016-10-20 ENCOUNTER — Ambulatory Visit: Payer: Medicare HMO | Admitting: Internal Medicine

## 2016-10-20 ENCOUNTER — Other Ambulatory Visit: Payer: Medicare HMO

## 2016-10-20 ENCOUNTER — Inpatient Hospital Stay
Admission: EM | Admit: 2016-10-20 | Discharge: 2016-10-22 | DRG: 871 | Disposition: A | Discharge status: Home Health | Payer: Medicare HMO | Attending: Specialist | Admitting: Specialist

## 2016-10-20 ENCOUNTER — Encounter: Payer: Self-pay | Admitting: Emergency Medicine

## 2016-10-20 DIAGNOSIS — N390 Urinary tract infection, site not specified: Secondary | ICD-10-CM | POA: Diagnosis present

## 2016-10-20 DIAGNOSIS — N3000 Acute cystitis without hematuria: Secondary | ICD-10-CM | POA: Diagnosis present

## 2016-10-20 DIAGNOSIS — E1122 Type 2 diabetes mellitus with diabetic chronic kidney disease: Secondary | ICD-10-CM | POA: Diagnosis present

## 2016-10-20 DIAGNOSIS — Z888 Allergy status to other drugs, medicaments and biological substances status: Secondary | ICD-10-CM | POA: Diagnosis not present

## 2016-10-20 DIAGNOSIS — E1165 Type 2 diabetes mellitus with hyperglycemia: Secondary | ICD-10-CM | POA: Diagnosis present

## 2016-10-20 DIAGNOSIS — D631 Anemia in chronic kidney disease: Secondary | ICD-10-CM | POA: Diagnosis present

## 2016-10-20 DIAGNOSIS — Z886 Allergy status to analgesic agent status: Secondary | ICD-10-CM

## 2016-10-20 DIAGNOSIS — H9190 Unspecified hearing loss, unspecified ear: Secondary | ICD-10-CM | POA: Diagnosis present

## 2016-10-20 DIAGNOSIS — Z794 Long term (current) use of insulin: Secondary | ICD-10-CM

## 2016-10-20 DIAGNOSIS — A419 Sepsis, unspecified organism: Secondary | ICD-10-CM | POA: Diagnosis present

## 2016-10-20 DIAGNOSIS — N186 End stage renal disease: Secondary | ICD-10-CM | POA: Diagnosis present

## 2016-10-20 DIAGNOSIS — Z87891 Personal history of nicotine dependence: Secondary | ICD-10-CM

## 2016-10-20 DIAGNOSIS — Z79899 Other long term (current) drug therapy: Secondary | ICD-10-CM

## 2016-10-20 DIAGNOSIS — E785 Hyperlipidemia, unspecified: Secondary | ICD-10-CM | POA: Diagnosis present

## 2016-10-20 DIAGNOSIS — R7989 Other specified abnormal findings of blood chemistry: Secondary | ICD-10-CM

## 2016-10-20 DIAGNOSIS — Z992 Dependence on renal dialysis: Secondary | ICD-10-CM

## 2016-10-20 DIAGNOSIS — Z7982 Long term (current) use of aspirin: Secondary | ICD-10-CM | POA: Diagnosis not present

## 2016-10-20 DIAGNOSIS — I120 Hypertensive chronic kidney disease with stage 5 chronic kidney disease or end stage renal disease: Secondary | ICD-10-CM | POA: Diagnosis present

## 2016-10-20 DIAGNOSIS — Z7951 Long term (current) use of inhaled steroids: Secondary | ICD-10-CM

## 2016-10-20 DIAGNOSIS — Z8673 Personal history of transient ischemic attack (TIA), and cerebral infarction without residual deficits: Secondary | ICD-10-CM | POA: Diagnosis not present

## 2016-10-20 DIAGNOSIS — N2581 Secondary hyperparathyroidism of renal origin: Secondary | ICD-10-CM | POA: Diagnosis present

## 2016-10-20 DIAGNOSIS — K589 Irritable bowel syndrome without diarrhea: Secondary | ICD-10-CM | POA: Diagnosis present

## 2016-10-20 DIAGNOSIS — N3090 Cystitis, unspecified without hematuria: Secondary | ICD-10-CM

## 2016-10-20 DIAGNOSIS — E872 Acidosis: Secondary | ICD-10-CM | POA: Diagnosis present

## 2016-10-20 DIAGNOSIS — I10 Essential (primary) hypertension: Secondary | ICD-10-CM | POA: Diagnosis present

## 2016-10-20 DIAGNOSIS — Z8551 Personal history of malignant neoplasm of bladder: Secondary | ICD-10-CM

## 2016-10-20 DIAGNOSIS — Z7952 Long term (current) use of systemic steroids: Secondary | ICD-10-CM

## 2016-10-20 DIAGNOSIS — K219 Gastro-esophageal reflux disease without esophagitis: Secondary | ICD-10-CM | POA: Diagnosis present

## 2016-10-20 DIAGNOSIS — Z8572 Personal history of non-Hodgkin lymphomas: Secondary | ICD-10-CM | POA: Diagnosis not present

## 2016-10-20 DIAGNOSIS — IMO0002 Reserved for concepts with insufficient information to code with codable children: Secondary | ICD-10-CM | POA: Diagnosis present

## 2016-10-20 DIAGNOSIS — Z809 Family history of malignant neoplasm, unspecified: Secondary | ICD-10-CM | POA: Diagnosis not present

## 2016-10-20 DIAGNOSIS — R748 Abnormal levels of other serum enzymes: Secondary | ICD-10-CM | POA: Diagnosis present

## 2016-10-20 DIAGNOSIS — R778 Other specified abnormalities of plasma proteins: Secondary | ICD-10-CM

## 2016-10-20 LAB — CBC WITH DIFFERENTIAL/PLATELET
BASOS PCT: 0 %
Basophils Absolute: 0 10*3/uL (ref 0–0.1)
Eosinophils Absolute: 0 10*3/uL (ref 0–0.7)
Eosinophils Relative: 0 %
HEMATOCRIT: 32.5 % — AB (ref 40.0–52.0)
HEMOGLOBIN: 11.3 g/dL — AB (ref 13.0–18.0)
LYMPHS PCT: 19 %
Lymphs Abs: 1.4 10*3/uL (ref 1.0–3.6)
MCH: 33.9 pg (ref 26.0–34.0)
MCHC: 34.7 g/dL (ref 32.0–36.0)
MCV: 97.5 fL (ref 80.0–100.0)
MONOS PCT: 2 %
Monocytes Absolute: 0.2 10*3/uL (ref 0.2–1.0)
NEUTROS ABS: 5.8 10*3/uL (ref 1.4–6.5)
NEUTROS PCT: 79 %
Platelets: 86 10*3/uL — ABNORMAL LOW (ref 150–440)
RBC: 3.33 MIL/uL — ABNORMAL LOW (ref 4.40–5.90)
RDW: 14.3 % (ref 11.5–14.5)
WBC: 7.4 10*3/uL (ref 3.8–10.6)

## 2016-10-20 LAB — LACTIC ACID, PLASMA
LACTIC ACID, VENOUS: 2.2 mmol/L — AB (ref 0.5–1.9)
LACTIC ACID, VENOUS: 1.3 mmol/L (ref 0.5–1.9)

## 2016-10-20 LAB — COMPREHENSIVE METABOLIC PANEL
ALBUMIN: 3.9 g/dL (ref 3.5–5.0)
ALT: 16 U/L — ABNORMAL LOW (ref 17–63)
ANION GAP: 12 (ref 5–15)
AST: 22 U/L (ref 15–41)
Alkaline Phosphatase: 51 U/L (ref 38–126)
BILIRUBIN TOTAL: 0.6 mg/dL (ref 0.3–1.2)
BUN: 51 mg/dL — ABNORMAL HIGH (ref 6–20)
CO2: 26 mmol/L (ref 22–32)
Calcium: 8.6 mg/dL — ABNORMAL LOW (ref 8.9–10.3)
Chloride: 98 mmol/L — ABNORMAL LOW (ref 101–111)
Creatinine, Ser: 8.18 mg/dL — ABNORMAL HIGH (ref 0.61–1.24)
GFR calc Af Amer: 7 mL/min — ABNORMAL LOW (ref >60)
GFR calc non Af Amer: 6 mL/min — ABNORMAL LOW (ref >60)
GLUCOSE: 260 mg/dL — AB (ref 65–99)
POTASSIUM: 4.7 mmol/L (ref 3.5–5.1)
SODIUM: 136 mmol/L (ref 135–145)
TOTAL PROTEIN: 6.6 g/dL (ref 6.5–8.1)

## 2016-10-20 LAB — TROPONIN I
Troponin I: 0.05 ng/mL (ref <0.03)
Troponin I: 0.05 ng/mL (ref <0.03)
Troponin I: 0.04 ng/mL (ref <0.03)

## 2016-10-20 LAB — URINALYSIS, COMPLETE (UACMP) WITH MICROSCOPIC
BILIRUBIN URINE: NEGATIVE
Glucose, UA: NEGATIVE mg/dL
KETONES UR: NEGATIVE mg/dL
Nitrite: NEGATIVE
Protein, ur: 100 mg/dL — AB
SQUAMOUS EPITHELIAL / LPF: NONE SEEN
Specific Gravity, Urine: 1.012 (ref 1.005–1.030)
pH: 5 (ref 5.0–8.0)

## 2016-10-20 LAB — GLUCOSE, CAPILLARY
Glucose-Capillary: 123 mg/dL — ABNORMAL HIGH (ref 65–99)
Glucose-Capillary: 232 mg/dL — ABNORMAL HIGH (ref 65–99)

## 2016-10-20 LAB — BLOOD GAS, VENOUS
ACID-BASE EXCESS: 6.2 mmol/L — AB (ref 0.0–2.0)
Bicarbonate: 31.2 mmol/L — ABNORMAL HIGH (ref 20.0–28.0)
O2 SAT: 70 %
PCO2 VEN: 46 mmHg (ref 44.0–60.0)
PO2 VEN: 35 mmHg (ref 32.0–45.0)
Patient temperature: 37
pH, Ven: 7.44 — ABNORMAL HIGH (ref 7.250–7.430)

## 2016-10-20 LAB — MRSA PCR SCREENING: MRSA by PCR: NEGATIVE

## 2016-10-20 MED ORDER — NEOMYCIN-POLYMYXIN-DEXAMETH 3.5-10000-0.1 OP OINT
1.0000 "application " | TOPICAL_OINTMENT | Freq: Two times a day (BID) | OPHTHALMIC | Status: DC
  Administered 2016-10-20 – 2016-10-22 (×4): 1 via OPHTHALMIC
  Filled 2016-10-20: qty 3.5

## 2016-10-20 MED ORDER — CALCIUM ACETATE (PHOS BINDER) 667 MG PO CAPS
2001.0000 mg | ORAL_CAPSULE | Freq: Three times a day (TID) | ORAL | Status: DC
  Administered 2016-10-20 – 2016-10-22 (×4): 2001 mg via ORAL
  Filled 2016-10-20 (×7): qty 3

## 2016-10-20 MED ORDER — INSULIN DETEMIR 100 UNIT/ML ~~LOC~~ SOLN
15.0000 [IU] | Freq: Every day | SUBCUTANEOUS | Status: DC
  Administered 2016-10-20 – 2016-10-21 (×2): 15 [IU] via SUBCUTANEOUS
  Filled 2016-10-20 (×3): qty 0.15

## 2016-10-20 MED ORDER — METOPROLOL TARTRATE 25 MG PO TABS: 25.0000 mg | ORAL_TABLET | ORAL | Status: DC

## 2016-10-20 MED ORDER — METOPROLOL TARTRATE 25 MG PO TABS
25.0000 mg | ORAL_TABLET | ORAL | Status: DC
  Administered 2016-10-21: 25 mg via ORAL
  Filled 2016-10-20: qty 1

## 2016-10-20 MED ORDER — INSULIN DETEMIR 100 UNIT/ML FLEXPEN: 15.0000 [IU] | PEN_INJECTOR | Freq: Every day | SUBCUTANEOUS | Status: DC

## 2016-10-20 MED ORDER — PRAVASTATIN SODIUM 20 MG PO TABS
20.0000 mg | ORAL_TABLET | Freq: Every day | ORAL | Status: DC
  Administered 2016-10-20 – 2016-10-21 (×2): 20 mg via ORAL
  Filled 2016-10-20 (×2): qty 1

## 2016-10-20 MED ORDER — CALCITRIOL 0.25 MCG PO CAPS
0.2500 ug | ORAL_CAPSULE | ORAL | Status: DC
  Filled 2016-10-20: qty 1

## 2016-10-20 MED ORDER — CALCIUM ACETATE 667 MG PO CAPS: 1334.0000 mg | ORAL_CAPSULE | ORAL | Status: DC

## 2016-10-20 MED ORDER — INSULIN ASPART 100 UNIT/ML ~~LOC~~ SOLN
0.0000 [IU] | Freq: Three times a day (TID) | SUBCUTANEOUS | Status: DC
  Administered 2016-10-20: 1 [IU] via SUBCUTANEOUS
  Filled 2016-10-20: qty 1

## 2016-10-20 MED ORDER — CALCIUM ACETATE (PHOS BINDER) 667 MG PO CAPS
1334.0000 mg | ORAL_CAPSULE | Freq: Two times a day (BID) | ORAL | Status: DC | PRN
  Filled 2016-10-20: qty 2

## 2016-10-20 MED ORDER — VANCOMYCIN HCL IN DEXTROSE 1-5 GM/200ML-% IV SOLN
1000.0000 mg | INTRAVENOUS | Status: DC | PRN
  Filled 2016-10-20: qty 200

## 2016-10-20 MED ORDER — ACETAMINOPHEN 650 MG RE SUPP: 650.0000 mg | Freq: Four times a day (QID) | RECTAL | Status: DC | PRN

## 2016-10-20 MED ORDER — VANCOMYCIN HCL IN DEXTROSE 750-5 MG/150ML-% IV SOLN
750.0000 mg | Freq: Once | INTRAVENOUS | Status: AC
  Administered 2016-10-20: 750 mg via INTRAVENOUS
  Filled 2016-10-20: qty 150

## 2016-10-20 MED ORDER — ONDANSETRON HCL 4 MG/2ML IJ SOLN: 4.0000 mg | Freq: Four times a day (QID) | INTRAMUSCULAR | Status: DC | PRN

## 2016-10-20 MED ORDER — DIFLUPREDNATE 0.05 % OP EMUL: 1.0000 [drp] | Freq: Four times a day (QID) | OPHTHALMIC | Status: DC

## 2016-10-20 MED ORDER — AMLODIPINE BESYLATE 10 MG PO TABS
10.0000 mg | ORAL_TABLET | Freq: Every day | ORAL | Status: DC
  Administered 2016-10-20 – 2016-10-21 (×2): 10 mg via ORAL
  Filled 2016-10-20 (×3): qty 1

## 2016-10-20 MED ORDER — VANCOMYCIN HCL IN DEXTROSE 1-5 GM/200ML-% IV SOLN
1000.0000 mg | Freq: Once | INTRAVENOUS | Status: AC
  Administered 2016-10-20: 1000 mg via INTRAVENOUS
  Filled 2016-10-20: qty 200

## 2016-10-20 MED ORDER — ASPIRIN 325 MG PO TABS
325.0000 mg | ORAL_TABLET | Freq: Every day | ORAL | Status: DC
  Administered 2016-10-20 – 2016-10-21 (×2): 325 mg via ORAL
  Filled 2016-10-20 (×3): qty 1

## 2016-10-20 MED ORDER — ONDANSETRON HCL 4 MG PO TABS: 4.0000 mg | ORAL_TABLET | Freq: Four times a day (QID) | ORAL | Status: DC | PRN

## 2016-10-20 MED ORDER — GABAPENTIN 100 MG PO CAPS
200.0000 mg | ORAL_CAPSULE | Freq: Every day | ORAL | Status: DC
  Administered 2016-10-20 – 2016-10-21 (×2): 200 mg via ORAL
  Filled 2016-10-20 (×2): qty 2

## 2016-10-20 MED ORDER — INSULIN ASPART 100 UNIT/ML ~~LOC~~ SOLN
0.0000 [IU] | Freq: Every day | SUBCUTANEOUS | Status: DC
Start: 2016-10-20 — End: 2016-10-22
  Administered 2016-10-20: 2 [IU] via SUBCUTANEOUS
  Filled 2016-10-20: qty 1

## 2016-10-20 MED ORDER — ACETAMINOPHEN 325 MG PO TABS
650.0000 mg | ORAL_TABLET | Freq: Four times a day (QID) | ORAL | Status: DC | PRN
  Administered 2016-10-20 – 2016-10-21 (×2): 650 mg via ORAL
  Filled 2016-10-20 (×2): qty 2

## 2016-10-20 MED ORDER — HEPARIN SODIUM (PORCINE) 5000 UNIT/ML IJ SOLN
5000.0000 [IU] | Freq: Three times a day (TID) | INTRAMUSCULAR | Status: DC
  Administered 2016-10-20 – 2016-10-22 (×3): 5000 [IU] via SUBCUTANEOUS
  Filled 2016-10-20 (×3): qty 1

## 2016-10-20 MED ORDER — PIPERACILLIN-TAZOBACTAM 3.375 G IVPB 30 MIN
3.3750 g | Freq: Once | INTRAVENOUS | Status: AC
  Administered 2016-10-20: 3.375 g via INTRAVENOUS

## 2016-10-20 MED ORDER — PIPERACILLIN-TAZOBACTAM 3.375 G IVPB
3.3750 g | Freq: Two times a day (BID) | INTRAVENOUS | Status: DC
  Administered 2016-10-20: 3.375 g via INTRAVENOUS
  Filled 2016-10-20 (×2): qty 50

## 2016-10-20 NOTE — ED Provider Notes (Signed)
[facility] Emergency Department Provider Note       Time seen: ----------------------------------------- 11:32 AM on 10/20/2016 -----------------------------------------     I have reviewed the triage vital signs and the nursing notes.   HISTORY   Chief Complaint Dysuria    HPI Mark [name]. is a 71 y.o. male who presents to the ED for fever. Patient was brought from dialysis with a high temperature. Patient reported he showed up to dialysis not feeling well, MAXIMUM TEMPERATURE was noted to be 102.9. He's had some dysuria since yesterday with a foul smell. He did receive some Tylenol prior to arrival. Recently finished antibiotics for left eye cataract surgery which resulted in some type of infection. Patient reports he feels like this has improved. He has a wound on his left foot which is chronic and nondraining. He denies cough, congestion or sore throat. Denies vomiting or diarrhea.   Past Medical History:  Diagnosis Date  . Anemia   . Arthritis   . Bladder cancer ([place])   . Chronic kidney disease   . Diabetes mellitus without complication ([place])   . Dialysis patient [facility])    M,W,F  . Dyspnea    DOE  . GERD (gastroesophageal reflux disease)   . HOH (hard of hearing)   . Hypertension   . IBS (irritable bowel syndrome)   . Lymphoma ([place]) 09/27/2014  . Neuropathy    RIGHT LEG  . Stroke [facility])    TIA    Patient Active Problem List   Diagnosis Date Noted  . 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 Ulcer of foot, chronic ([place]) 03/21/2015  . History of bladder cancer 11/10/2014  . Penile bleeding 10/12/2014  . Dependence on renal dialysis ([place]) 08/18/2013  . Arterial blood pressure decreased 08/18/2013  . Leukemia ([place]) 08/18/2013  . Retina disorder 08/18/2013  . Breath shortness 08/18/2013  . Diabetes mellitus ([place]) 08/18/2013  . End-stage renal disease on  hemodialysis ([place]) 12/23/2012  . Absolute anemia 07/18/2012  . HPTH (hyperparathyroidism) ([place]) 07/18/2012  . Acidosis, metabolic 50/27/7412  . Hyperparathyroidism ([place]) 07/18/2012  . Abnormal presence of protein in urine 01/01/2012  . HLD (hyperlipidemia) 12/25/2011  . Diabetic retinopathy associated with type 2 diabetes mellitus ([place]) 10/26/1999  . Essential (primary) hypertension 04/04/1999  . Diabetes mellitus type 2, uncontrolled ([place]) 04/04/1999  . Type 2 diabetes mellitus with other diabetic kidney complication ([place]) 87/86/7672    Past Surgical History:  Procedure Laterality Date  . BACK SURGERY  2007  . CATARACT EXTRACTION W/PHACO Left 03/23/2016   Procedure: CATARACT EXTRACTION PHACO AND INTRAOCULAR LENS PLACEMENT (IOC);  Surgeon: Leandrew Koyanagi, MD;  Location: [facility] ORS;  Service: Ophthalmology;  Laterality: Left;  [place] 52.6 AP% 14.7 CDE 7.72 Fluid pack lot # [id number]  . CATARACT EXTRACTION W/PHACO Right 05/30/2016   Procedure: CATARACT EXTRACTION PHACO AND INTRAOCULAR LENS PLACEMENT ([place]);  Surgeon: Leandrew Koyanagi, MD;  Location: [facility] ORS;  Service: Ophthalmology;  Laterality: Right;  [place] 01:08 AP% 13.9 CDE 9.53  note: could not get IV in patient, so procedure done without anesthesia personell present, ok per Dr Wallace Going fluid pack lo t# [id number]  . CIRCUMCISION    . PERIPHERAL VASCULAR CATHETERIZATION Left 08/19/2015   Procedure: A/V Shuntogram/Fistulagram;  Surgeon: Algernon Huxley, MD;  Location: [place] CV LAB;  Service: Cardiovascular;  Laterality: Left;  . PERIPHERAL VASCULAR CATHETERIZATION N/A 08/19/2015   Procedure: A/V Shunt Intervention;  Surgeon:  Algernon Huxley, MD;  Location: [place] CV LAB;  Service: Cardiovascular;  Laterality: N/A;    Allergies Ibuprofen; Multivitamin [centrum]; and Daypro [oxaprozin]  Social History Social History  Substance Use Topics  . Smoking status: Former Smoker    Types: Cigarettes    Quit date: 05/19/2013  .  Smokeless tobacco: Never Used     Comment: quit  . Alcohol use No    Review of Systems Constitutional: Positive for fever Eyes: Negative for vision changes. Positive for clear drainage from the left eye ENT:  Negative for congestion, sore throat Cardiovascular: Negative for chest pain. Respiratory: Negative for shortness of breath. Gastrointestinal: Negative for abdominal pain, vomiting and diarrhea. Genitourinary: Positive for dysuria Musculoskeletal: Negative for back pain. Skin: Negative for rash. Neurological: Negative for headaches, focal weakness or numbness.  All systems negative/normal/unremarkable except as stated in the HPI  ____________________________________________   PHYSICAL EXAM:  VITAL SIGNS: ED Triage Vitals  Enc Vitals Group     BP --      Pulse Rate 10/20/16 1131 83     Resp 10/20/16 1131 19     Temp 10/20/16 1131 (!) 102.8 F (39.3 C)     Temp Source 10/20/16 1131 Oral     SpO2 --      Weight 10/20/16 1126 205 lb (93 kg)     Height 10/20/16 1126 5\' 10"  (1.778 m)     Head Circumference --      Peak Flow --      Pain Score --      Pain Loc --      Pain Edu? --      Excl. in [place]? --     Constitutional: Alert and oriented. Well appearing and in no distress. Eyes: Mild conjunctival injection left eye, clear drainage. Normal extraocular movements. ENT   Head: Normocephalic and atraumatic.   Nose: No congestion/rhinnorhea.   Mouth/Throat: Mucous membranes are moist.   Neck: No stridor. Cardiovascular: Normal rate, regular rhythm. No murmurs, rubs, or gallops. Respiratory: Normal respiratory effort without tachypnea nor retractions. Breath sounds are clear and equal bilaterally. No wheezes/rales/rhonchi. Gastrointestinal: Soft and nontender. Normal bowel sounds Musculoskeletal: Nontender with normal range of motion in extremities. No lower extremity tenderness nor edema. There is a chronic appearing corn on the bottom of the left foot. No  wound drainage or erythema Neurologic:  Normal speech and language. No gross focal neurologic deficits are appreciated.  Skin:  Skin is warm, dry and intact. No rash noted. Psychiatric: Mood and affect are normal. Speech and behavior are normal.  ____________________________________________  EKG: Interpreted by me. Sinus rhythm rate 88 bpm, normal PR interval, normal QRS width, normal QT. Possible septal infarct age indeterminate  ____________________________________________  ED COURSE:  Pertinent labs & imaging results that were available during my care of the patient were reviewed by me and considered in my medical decision making (see chart for details). Patient presents for fever with dysuria and likely UTI on dialysis, we will assess with labs and imaging as indicated. We will initiate code sepsis but not give IV fluids due to end-stage renal disease requiring dialysis and he is normotensive. Currently he is due for dialysis.   Procedures ____________________________________________   LABS (pertinent positives/negatives)  Labs Reviewed  LACTIC ACID, PLASMA - Abnormal; Notable for the following:       Result Value   Lactic Acid, Venous 2.2 (*)    All other components within normal limits  COMPREHENSIVE METABOLIC PANEL - Abnormal;  Notable for the following:    Chloride 98 (*)    Glucose, Bld 260 (*)    BUN 51 (*)    Creatinine, Ser 8.18 (*)    Calcium 8.6 (*)    ALT 16 (*)    GFR calc non Af Amer 6 (*)    GFR calc Af Amer 7 (*)    All other components within normal limits  TROPONIN I - Abnormal; Notable for the following:    Troponin I 0.05 (*)    All other components within normal limits  CBC WITH DIFFERENTIAL/PLATELET - Abnormal; Notable for the following:    RBC 3.33 (*)    Hemoglobin 11.3 (*)    HCT 32.5 (*)    Platelets 86 (*)    All other components within normal limits  BLOOD GAS, VENOUS - Abnormal; Notable for the following:    pH, Ven 7.44 (*)    Bicarbonate  31.2 (*)    Acid-Base Excess 6.2 (*)    All other components within normal limits  CULTURE, BLOOD (ROUTINE X 2)  CULTURE, BLOOD (ROUTINE X 2)  URINE CULTURE  LACTIC ACID, PLASMA  URINALYSIS, COMPLETE (UACMP) WITH MICROSCOPIC   CRITICAL CARE Performed by: Earleen Newport   Total critical care time: 30 minutes  Critical care time was exclusive of separately billable procedures and treating other patients.  Critical care was necessary to treat or prevent imminent or life-threatening deterioration.  Critical care was time spent personally by me on the following activities: development of treatment plan with patient and/or surrogate as well as nursing, discussions with consultants, evaluation of patient's response to treatment, examination of patient, obtaining history from patient or surrogate, ordering and performing treatments and interventions, ordering and review of laboratory studies, ordering and review of radiographic studies, pulse oximetry and re-evaluation of patient's condition.  RADIOLOGY  Chest x-ray  IMPRESSION: 1. No acute cardiopulmonary disease. ____________________________________________  FINAL ASSESSMENT AND PLAN  Fever, acute cystitis, end-stage renal disease  Plan: Patient's labs and imaging were dictated above. Patient had presented for fever and dysuria over the last 24 hours. He is due for dialysis this morning. Labs are reassuring however he would benefit from observation, IV antibiotics and inpatient dialysis. I will discuss with the hospitalist for admission.   Earleen Newport, MD   Note: This note was generated in part or whole with voice recognition software. Voice recognition is usually quite accurate but there are transcription errors that can and very often do occur. I apologize for any typographical errors that were not detected and corrected.     Earleen Newport, MD 10/20/16 1410

## 2016-10-20 NOTE — ED Triage Notes (Signed)
Patient presents to ED via [facility] from dialysis. Patient did not receive any dialysis today. They checked his temperature and got 102.9. Patient c/o dysuria since yesterday. EMS gave 975 of tylenol. Patient recently finished antibiotics for an eye procedure he had. Patient A&O x4.

## 2016-10-20 NOTE — Progress Notes (Signed)
Patient admitted to the unit. Patient alert and oriented x 4. Skin assessment and admission completed. Patient was educated about call light system and phone.

## 2016-10-20 NOTE — H&P (Signed)
[place] [facility] [place] NAME: Mark [name]    MR#:  [id number]  DATE OF BIRTH:  1945/06/08  DATE OF ADMISSION:  10/20/2016  PRIMARY CARE PHYSICIAN: Marden Noble, MD   REQUESTING/REFERRING PHYSICIAN: Jimmye Norman, MD  CHIEF COMPLAINT:   Chief Complaint  Patient presents with  . Code Sepsis    HISTORY OF PRESENT ILLNESS:  Mark [name]  is a 71 y.o. male who presents with Dysuria, some confusion, and generalized weakness. Patient states that symptoms began yesterday. He is a dialysis patient, but he still makes a small amount of urine up to 5 times a day. He went to dialysis today, but did not receive dialysis because he had a fever of 102. He was sent to the ED for evaluation. There is workup is consistent with UTI. He meets sepsis criteria with fever and lactic acidosis. Hospitals were called for admission  PAST MEDICAL HISTORY:   Past Medical History:  Diagnosis Date  . Anemia   . Arthritis   . Bladder cancer ([place])   . Chronic kidney disease   . Diabetes mellitus without complication ([place])   . Dialysis patient [facility])    M,W,F  . Dyspnea    DOE  . GERD (gastroesophageal reflux disease)   . HOH (hard of hearing)   . Hypertension   . IBS (irritable bowel syndrome)   . Lymphoma ([place]) 09/27/2014  . Neuropathy    RIGHT LEG  . Stroke [facility])    TIA    PAST SURGICAL HISTORY:   Past Surgical History:  Procedure Laterality Date  . BACK SURGERY  2007  . CATARACT EXTRACTION W/PHACO Left 03/23/2016   Procedure: CATARACT EXTRACTION PHACO AND INTRAOCULAR LENS PLACEMENT (IOC);  Surgeon: Leandrew Koyanagi, MD;  Location: [facility] ORS;  Service: Ophthalmology;  Laterality: Left;  [place] 52.6 AP% 14.7 CDE 7.72 Fluid pack lot # [id number]  . CATARACT EXTRACTION W/PHACO Right 05/30/2016   Procedure: CATARACT EXTRACTION PHACO AND INTRAOCULAR LENS PLACEMENT ([place]);  Surgeon: Leandrew Koyanagi, MD;  Location: [facility] ORS;  Service: Ophthalmology;   Laterality: Right;  [place] 01:08 AP% 13.9 CDE 9.53  note: could not get IV in patient, so procedure done without anesthesia personell present, ok per Dr Wallace Going fluid pack lo t# [id number]  . CIRCUMCISION    . PERIPHERAL VASCULAR CATHETERIZATION Left 08/19/2015   Procedure: A/V Shuntogram/Fistulagram;  Surgeon: Algernon Huxley, MD;  Location: [place] CV LAB;  Service: Cardiovascular;  Laterality: Left;  . PERIPHERAL VASCULAR CATHETERIZATION N/A 08/19/2015   Procedure: A/V Shunt Intervention;  Surgeon: Algernon Huxley, MD;  Location: [place] [place] [employer] CV LAB;  Service: Cardiovascular;  Laterality: N/A;    SOCIAL HISTORY:   Social History  Substance Use Topics  . Smoking status: Former Smoker    Types: Cigarettes    Quit date: 05/19/2013  . Smokeless tobacco: Never Used     Comment: quit  . Alcohol use No    FAMILY HISTORY:   Family History  Problem Relation Age of Onset  . Cancer Mother   . Kidney cancer Neg Hx   . Prostate cancer Neg Hx   . Kidney failure Neg Hx   . Bladder Cancer Neg Hx     DRUG ALLERGIES:   Allergies  Allergen Reactions  . Ibuprofen     DUE TO DIALYSIS  . Multivitamin [Centrum]     DUE TO DIALYSIS  . Daypro [Oxaprozin] Swelling and Rash    Other reaction(s): Other (See Comments)  MEDICATIONS AT HOME:   Prior to Admission medications   Medication Sig Start Date End Date Taking? Authorizing Provider  amLODipine (NORVASC) 10 MG tablet Take 10 mg by mouth daily.  06/02/13  Yes [provider]  aspirin 325 MG tablet Take 325 mg by mouth daily.   Yes [provider]  calcitRIOL (ROCALTROL) 0.25 MCG capsule Take 0.25 mcg by mouth every Monday, Wednesday, and Friday.    Yes [provider]  calcium acetate (PHOSLO) 667 MG capsule Take 1,334-2,001 mg by mouth See admin instructions. 3 caps with meals and 2 caps with snacks   Yes [provider]  Difluprednate (DUREZOL) 0.05 % EMUL Place 1 drop into the left eye 4 (four) times  daily.   Yes [provider]  fluticasone (FLONASE) 50 MCG/ACT nasal spray Place 1 spray into both nostrils daily as needed for allergies.  12/14/14  Yes [provider]  furosemide (LASIX) 80 MG tablet Take 80-160 mg by mouth See admin instructions. 2 tablets in am 1 tablet in PM   Yes [provider]  gabapentin (NEURONTIN) 100 MG capsule Take 1 capsule (100 mg total) by mouth 3 (three) times daily. Patient taking differently: Take 200-300 mg by mouth at bedtime.  03/02/16  Yes Edrick Kins, DPM  glimepiride (AMARYL) 4 MG tablet Take 8 mg by mouth at bedtime.  10/06/14  Yes [provider]  LEVEMIR FLEXTOUCH 100 UNIT/ML Pen Inject 25-30 Units into the skin daily at 10 pm. Depending on what he eats 08/21/14  Yes [provider]  lovastatin (MEVACOR) 20 MG tablet Take 20 mg by mouth at bedtime.   Yes [provider]  metoCLOPramide (REGLAN) 5 MG tablet Take 5 mg by mouth 2 (two) times daily. 1 TAB M,W,F 1TAB BID T,T,S,S   Yes [provider]  metoprolol tartrate (LOPRESSOR) 25 MG tablet Take 25 mg by mouth See admin instructions. Pt takes Tues morning, Friday Night, and Saturday morning and evening   Yes [provider]  multivitamin (RENA-VIT) TABS tablet Take 1 tablet by mouth at bedtime.    Yes [provider]  neomycin-polymyxin b-dexamethasone (MAXITROL) 3.5-10000-0.1 OINT Place 1 application into the left eye 2 (two) times daily.  08/16/16  Yes [provider]  silver sulfADIAZINE (SILVADENE) 1 % cream Apply 1 application topically daily. 07/04/16  Yes Edrick Kins, DPM  ENSURE PLUS (ENSURE PLUS) LIQD Take by mouth 3 (three) times a week. M,W.F 01/26/14   [provider]  prednisoLONE acetate (PRED FORTE) 1 % ophthalmic suspension Place 1 drop into the left eye 4 (four) times daily. 10/03/16   [provider]    REVIEW OF SYSTEMS:  Review of Systems  Constitutional: Positive for fever.  Negative for chills, malaise/fatigue and weight loss.  HENT: Negative for ear pain, hearing loss and tinnitus.   Eyes: Negative for blurred vision, double vision, pain and redness.  Respiratory: Negative for cough, hemoptysis and shortness of breath.   Cardiovascular: Negative for chest pain, palpitations, orthopnea and leg swelling.  Gastrointestinal: Negative for abdominal pain, constipation, diarrhea, nausea and vomiting.  Genitourinary: Positive for dysuria and urgency. Negative for frequency and hematuria.  Musculoskeletal: Negative for back pain, joint pain and neck pain.  Skin:       No acne, rash, or lesions  Neurological: Positive for weakness. Negative for dizziness, tremors and focal weakness.  Endo/Heme/Allergies: Negative for polydipsia. Does not bruise/bleed easily.  Psychiatric/Behavioral: Negative for depression. The patient is not  nervous/anxious and does not have insomnia.      VITAL SIGNS:   Vitals:   10/20/16 1300 10/20/16 1349 10/20/16 1359 10/20/16 1400  BP: (!) 146/63 (!) 138/54  (!) 142/54  Pulse: 70 80  69  Resp: 14 18  17   Temp:   99.9 F (37.7 C)   TempSrc:   Oral   SpO2: 96% 95%  95%  Weight:      Height:       Wt Readings from Last 3 Encounters:  10/20/16 93 kg (205 lb)  08/17/16 94.3 kg (208 lb)  08/01/16 94.3 kg (208 lb)    PHYSICAL EXAMINATION:  Physical Exam  Vitals reviewed. Constitutional: He is oriented to person, place, and time. He appears well-developed and well-nourished. No distress.  HENT:  Head: Normocephalic and atraumatic.  Mouth/Throat: Oropharynx is clear and moist.  Eyes: Pupils are equal, round, and reactive to light. Conjunctivae and EOM are normal. No scleral icterus.  Neck: Normal range of motion. Neck supple. No JVD present. No thyromegaly present.  Cardiovascular: Normal rate, regular rhythm and intact distal pulses.  Exam reveals no gallop and no friction rub.   No murmur heard. Respiratory: Effort normal and breath  sounds normal. No respiratory distress. He has no wheezes. He has no rales.  GI: Soft. Bowel sounds are normal. He exhibits no distension. There is tenderness.  Musculoskeletal: Normal range of motion. He exhibits no edema.  No arthritis, no gout  Lymphadenopathy:    He has no cervical adenopathy.  Neurological: He is alert and oriented to person, place, and time. No cranial nerve deficit.  No dysarthria, no aphasia  Skin: Skin is warm and dry. No rash noted. No erythema.  Psychiatric: He has a normal mood and affect. His behavior is normal. Judgment and thought content normal.    LABORATORY PANEL:   CBC  Recent Labs Lab 10/20/16 1132  WBC 7.4  HGB 11.3*  HCT 32.5*  PLT 86*   ------------------------------------------------------------------------------------------------------------------  Chemistries   Recent Labs Lab 10/20/16 1132  NA 136  K 4.7  CL 98*  CO2 26  GLUCOSE 260*  BUN 51*  CREATININE 8.18*  CALCIUM 8.6*  AST 22  ALT 16*  ALKPHOS 51  BILITOT 0.6   ------------------------------------------------------------------------------------------------------------------  Cardiac Enzymes  Recent Labs Lab 10/20/16 1132  TROPONINI 0.05*   ------------------------------------------------------------------------------------------------------------------  RADIOLOGY:  Dg Chest Port 1 View  Result Date: 10/20/2016 CLINICAL DATA:  Pt sent from dialysis for fever of 102. Did not receive dialysis today. Hx of bladder cancer, diabetes, HTN, stroke. Former smoker. EXAM: PORTABLE CHEST 1 VIEW COMPARISON:  PET-CT, 05/16/2016.  Chest radiograph, 10/15/2013. FINDINGS: Cardiac silhouette is normal in size and configuration. Thickening of the right paratracheal stripe is consistent with the adenopathy noted on the previous PET-CT. No hilar masses or convincing adenopathy. Clear lungs.  No convincing pleural effusion.  No pneumothorax. Skeletal structures are grossly intact.  IMPRESSION: 1. No acute cardiopulmonary disease. Electronically Signed   By: Lajean Manes M.D.   On: 10/20/2016 12:16    EKG:   Orders placed or performed during the hospital encounter of 10/20/16  . ED EKG 12-Lead  . ED EKG 12-Lead    IMPRESSION AND PLAN:  Principal Problem:   Sepsis ([place]) - IV antibiotics started, lactic acid was mildly elevated at 2.2, we will administer gentle IV fluids and recheck frequently until within normal limits, Blood pressure stable, cultures sent from the ED. Active Problems:   UTI (urinary tract infection) -  IV antibiotics and cultures as above   End-stage renal disease on hemodialysis [facility]) - nephrology consult for dialysis support   Essential (primary) hypertension - stable, continue home meds   Diabetes mellitus type 2, uncontrolled ([place]) - sliding scale insulin with corresponding glucose checks  All the records are reviewed and case discussed with ED provider. Management plans discussed with the patient and/or family.  DVT PROPHYLAXIS: SubQ heparin  GI PROPHYLAXIS: PPI  ADMISSION STATUS: Inpatient  CODE STATUS: Full Code Status History    This patient does not have a recorded code status. Please follow your organizational policy for patients in this situation.      TOTAL TIME TAKING CARE OF THIS PATIENT: 45 minutes.   Tamirah George [place] 10/20/2016, 2:43 PM  [facility]  Office  [phone]  CC: Primary care physician; Marden Noble, MD  Note:  This document was prepared using Dragon voice recognition software and may include unintentional dictation errors.

## 2016-10-20 NOTE — Progress Notes (Signed)
Pharmacy Antibiotic Note  Mark [name]. is a 71 y.o. male admitted on 10/20/2016 with sepsis.  Pharmacy has been consulted for vancomycin and piperacillin/tazobactam dosing.  Patient has ESRD on HD.  Plan: Piperacillin/tazobactam 3.375 g IV q12h EI  Vancomycin 1000 mg dose given in ED. Will order another 750 mg IV to be given now for a 1750 mg loading dose. Order vancomycin 1000 mg IV to be given at the end of HD - follow to determine HD schedule.  Goal VT 15-20 mcg/mL Draw vancomycin level prior to 3rd HD session per protocol.  Height: 5\' 10"  (177.8 cm) Weight: 205 lb (93 kg) IBW/kg (Calculated) : 73  Temp (24hrs), Avg:100.9 F (38.3 C), Min:99.9 F (37.7 C), Max:102.8 F (39.3 C)   Recent Labs Lab 10/20/16 1132  WBC 7.4  CREATININE 8.18*  LATICACIDVEN 2.2*    Estimated Creatinine Clearance: 9.5 mL/min (A) (by C-G formula based on SCr of 8.18 mg/dL (H)).    Allergies  Allergen Reactions  . Ibuprofen     DUE TO DIALYSIS  . Multivitamin [Centrum]     DUE TO DIALYSIS  . Daypro [Oxaprozin] Swelling and Rash    Other reaction(s): Other (See Comments)   Antimicrobials this admission: Piperacillin/tazobactam 7/20 >>  vancomycin 7/20 >>   Dose adjustments this admission:  Microbiology results: 7/20 BCx: Sent 7/20 UCx: Sent   Thank you for allowing pharmacy to be a part of this patient's care.  Lenis Noon, PharmD Clinical Pharmacist 10/20/2016 2:04 PM

## 2016-10-20 NOTE — ED Notes (Signed)
Code sepsis called to [facility] 

## 2016-10-20 NOTE — ED Notes (Signed)
Lab called about missing LA result. Said that it would be a few minutes.

## 2016-10-20 NOTE — ED Notes (Signed)
Dr Jimmye Norman notified face to face of troponin 0.05 and lactic acid 2.2 at 1255. No new orders.

## 2016-10-20 NOTE — ED Notes (Signed)
This RN attempted IV access x2. Unsuccessful. 

## 2016-10-21 LAB — URINE CULTURE: Culture: NO GROWTH

## 2016-10-21 LAB — CBC
HEMATOCRIT: 30.5 % — AB (ref 40.0–52.0)
Hemoglobin: 10.7 g/dL — ABNORMAL LOW (ref 13.0–18.0)
MCH: 34.1 pg — AB (ref 26.0–34.0)
MCHC: 35.2 g/dL (ref 32.0–36.0)
MCV: 96.8 fL (ref 80.0–100.0)
PLATELETS: 80 10*3/uL — AB (ref 150–440)
RBC: 3.15 MIL/uL — AB (ref 4.40–5.90)
RDW: 14.2 % (ref 11.5–14.5)
WBC: 6.7 10*3/uL (ref 3.8–10.6)

## 2016-10-21 LAB — GLUCOSE, CAPILLARY
GLUCOSE-CAPILLARY: 147 mg/dL — AB (ref 65–99)
Glucose-Capillary: 89 mg/dL (ref 65–99)
Glucose-Capillary: 110 mg/dL — ABNORMAL HIGH (ref 65–99)

## 2016-10-21 LAB — PHOSPHORUS: PHOSPHORUS: 4.3 mg/dL (ref 2.5–4.6)

## 2016-10-21 LAB — BASIC METABOLIC PANEL
Anion gap: 13 (ref 5–15)
BUN: 60 mg/dL — ABNORMAL HIGH (ref 6–20)
CHLORIDE: 94 mmol/L — AB (ref 101–111)
CO2: 29 mmol/L (ref 22–32)
CREATININE: 8.96 mg/dL — AB (ref 0.61–1.24)
Calcium: 8.5 mg/dL — ABNORMAL LOW (ref 8.9–10.3)
GFR calc non Af Amer: 5 mL/min — ABNORMAL LOW (ref >60)
GFR, EST AFRICAN AMERICAN: 6 mL/min — AB (ref >60)
Glucose, Bld: 144 mg/dL — ABNORMAL HIGH (ref 65–99)
POTASSIUM: 4.2 mmol/L (ref 3.5–5.1)
Sodium: 136 mmol/L (ref 135–145)

## 2016-10-21 MED ORDER — PENTAFLUOROPROP-TETRAFLUOROETH EX AERO
1.0000 "application " | INHALATION_SPRAY | CUTANEOUS | Status: DC | PRN
  Filled 2016-10-21: qty 30

## 2016-10-21 MED ORDER — LIDOCAINE HCL (PF) 1 % IJ SOLN
5.0000 mL | INTRAMUSCULAR | Status: DC | PRN
  Filled 2016-10-21: qty 5

## 2016-10-21 MED ORDER — SODIUM CHLORIDE 0.9 % IV SOLN: 100.0000 mL | INTRAVENOUS | Status: DC | PRN

## 2016-10-21 MED ORDER — HEPARIN SODIUM (PORCINE) 1000 UNIT/ML DIALYSIS
1000.0000 [IU] | INTRAMUSCULAR | Status: DC | PRN
  Filled 2016-10-21: qty 1

## 2016-10-21 MED ORDER — ALTEPLASE 2 MG IJ SOLR
2.0000 mg | Freq: Once | INTRAMUSCULAR | Status: DC | PRN
  Filled 2016-10-21: qty 2

## 2016-10-21 MED ORDER — DEXTROSE 5 % IV SOLN
1.0000 g | Freq: Every day | INTRAVENOUS | Status: DC
  Administered 2016-10-21 (×2): 1 g via INTRAVENOUS
  Filled 2016-10-21 (×3): qty 10

## 2016-10-21 MED ORDER — DEXTROSE 5 % IV SOLN
1.0000 g | Freq: Every day | INTRAVENOUS | Status: DC
  Filled 2016-10-21: qty 10

## 2016-10-21 MED ORDER — FLUTICASONE PROPIONATE 50 MCG/ACT NA SUSP
1.0000 | Freq: Every day | NASAL | Status: DC
  Filled 2016-10-21: qty 16

## 2016-10-21 MED ORDER — LIDOCAINE-PRILOCAINE 2.5-2.5 % EX CREA
1.0000 "application " | TOPICAL_CREAM | CUTANEOUS | Status: DC | PRN
  Filled 2016-10-21: qty 5

## 2016-10-21 NOTE — Progress Notes (Signed)
HD STARTED  

## 2016-10-21 NOTE — Progress Notes (Signed)
Post dialysis assessment 

## 2016-10-21 NOTE — Consult Note (Signed)
[facility] CONSULT NOTE    Date: 10/21/2016                  Patient Name:  Mark [name].  MRN: [id number]  DOB: January 04, 1946  Age / Sex: 71 y.o., male         PCP: Marden Noble, MD                 Service Requesting Consult: Hospitalist                  Reason for Consult: Evaluation management of end-stage renal disease             History of Present Illness: Patient is a 71 y.o. male with a PMHx of End-stage renal disease on hemodialysis MWF followed by [facility] nephrology, anemia chronic kidney disease, history of bladder cancer, diabetes mellitus type 2, GERD, hypertension, irritable bowel syndrome, history of lymphoma, history of TIA, who was admitted to [facility] on 10/20/2016 for evaluation of  dysuria and fever.  Apparently the patient does still make some urine. Urinalysis was felt to be consistent with urinary tract infection. Therefore he was started on broad-spectrum antibiotics. Blood cultures thus far are negative.  When he presented yesterday to the dialysis center he was found to have fever and was sent here subsequently. Therefore he did not receive dialysis treatment yesterday. He has a functional left upper extremity AV fistula.  Patient also has associated anemia of chronic kidney disease with most recent hemoglobin of 10.7. He is also followed by oncology for history of lymphoma.   Medications: Outpatient medications: Prescriptions Prior to Admission  Medication Sig Dispense Refill Last Dose  . amLODipine (NORVASC) 10 MG tablet Take 10 mg by mouth daily.    10/19/2016 at am  . aspirin 325 MG tablet Take 325 mg by mouth daily.   10/19/2016 at am  . calcitRIOL (ROCALTROL) 0.25 MCG capsule Take 0.25 mcg by mouth every Monday, Wednesday, and Friday.    10/20/2016 at am  . calcium acetate (PHOSLO) 667 MG capsule Take 1,334-2,001 mg by mouth See admin instructions. 3 caps with meals and 2 caps with snacks   10/19/2016 at pm  . Difluprednate (DUREZOL)  0.05 % EMUL Place 1 drop into the left eye 4 (four) times daily.   10/19/2016 at qhs   . fluticasone (FLONASE) 50 MCG/ACT nasal spray Place 1 spray into both nostrils daily as needed for allergies.    prn at prn  . furosemide (LASIX) 80 MG tablet Take 80-160 mg by mouth See admin instructions. 2 tablets in am 1 tablet in PM   10/19/2016 at pm  . gabapentin (NEURONTIN) 100 MG capsule Take 1 capsule (100 mg total) by mouth 3 (three) times daily. (Patient taking differently: Take 200-300 mg by mouth at bedtime. ) 90 capsule 0 10/19/2016 at qhs  . glimepiride (AMARYL) 4 MG tablet Take 8 mg by mouth at bedtime.    10/19/2016 at qhs  . LEVEMIR FLEXTOUCH 100 UNIT/ML Pen Inject 25-30 Units into the skin daily at 10 pm. Depending on what he eats   10/19/2016 at qhs   . lovastatin (MEVACOR) 20 MG tablet Take 20 mg by mouth at bedtime.   10/19/2016 at qhs   . metoCLOPramide (REGLAN) 5 MG tablet Take 5 mg by mouth 2 (two) times daily. 1 TAB M,W,F 1TAB BID T,T,S,S   10/19/2016 at qhs   . metoprolol tartrate (LOPRESSOR) 25 MG tablet Take 25  mg by mouth See admin instructions. Pt takes Tues morning, Friday Night, and Saturday morning and evening   10/17/2016 at am  . multivitamin (RENA-VIT) TABS tablet Take 1 tablet by mouth at bedtime.    10/19/2016 at qhs  . neomycin-polymyxin b-dexamethasone (MAXITROL) 3.5-10000-0.1 OINT Place 1 application into the left eye 2 (two) times daily.    10/19/2016 at pm  . silver sulfADIAZINE (SILVADENE) 1 % cream Apply 1 application topically daily. 30 g 2 prn at prn  . ENSURE PLUS (ENSURE PLUS) LIQD Take by mouth 3 (three) times a week. M,W.F   Taking  . prednisoLONE acetate (PRED FORTE) 1 % ophthalmic suspension Place 1 drop into the left eye 4 (four) times daily.   Not Taking at Unknown time    Current medications: Current Facility-Administered Medications  Medication Dose Route Frequency Provider Last Rate Last Dose  . acetaminophen (TYLENOL) tablet 650 mg  650 mg Oral Q6H PRN  Lance Coon, MD   650 mg at 10/20/16 1550   Or  . acetaminophen (TYLENOL) suppository 650 mg  650 mg Rectal Q6H PRN Lance Coon, MD      . amLODipine (NORVASC) tablet 10 mg  10 mg Oral Daily Lance Coon, MD   10 mg at 10/20/16 1756  . aspirin tablet 325 mg  325 mg Oral Daily Lance Coon, MD   325 mg at 10/20/16 1801  . calcitRIOL (ROCALTROL) capsule 0.25 mcg  0.25 mcg Oral Q M,W,F Lance Coon, MD      . calcium acetate (PHOSLO) capsule 1,334 mg  1,334 mg Oral BID BM PRN Lance Coon, MD      . calcium acetate (PHOSLO) capsule 2,001 mg  2,001 mg Oral TID WC Lance Coon, MD   2,001 mg at 10/21/16 0175  . gabapentin (NEURONTIN) capsule 200-300 mg  200-300 mg Oral Corwin Levins, MD   200 mg at 10/20/16 2203  . heparin injection 5,000 Units  5,000 Units Subcutaneous Driscilla Moats, MD   5,000 Units at 10/20/16 2203  . insulin aspart (novoLOG) injection 0-5 Units  0-5 Units Subcutaneous QHS Lance Coon, MD   2 Units at 10/20/16 2203  . insulin aspart (novoLOG) injection 0-9 Units  0-9 Units Subcutaneous TID WC Lance Coon, MD   1 Units at 10/20/16 1752  . insulin detemir (LEVEMIR) injection 15 Units  15 Units Subcutaneous QHS Lance Coon, MD   15 Units at 10/20/16 2211  . metoprolol tartrate (LOPRESSOR) tablet 25 mg  25 mg Oral Once per day on Tue Sat Lance Coon, MD      . metoprolol tartrate (LOPRESSOR) tablet 25 mg  25 mg Oral Once per day on Fri Sat Lance Coon, MD      . neomycin-polymyxin b-dexamethasone (MAXITROL) ophthalmic ointment 1 application  1 application Left Eye BID Lance Coon, MD   1 application at 01/26/84 [phone]  . ondansetron (ZOFRAN) tablet 4 mg  4 mg Oral Q6H PRN Lance Coon, MD       Or  . ondansetron [facility]) injection 4 mg  4 mg Intravenous Q6H PRN Lance Coon, MD      . piperacillin-tazobactam (ZOSYN) IVPB 3.375 g  3.375 g Intravenous Q12H Lenis Noon, [facility]   Stopped at 10/21/16 0203  . pravastatin (PRAVACHOL) tablet 20 mg  20 mg Oral q1800  Lance Coon, MD   20 mg at 10/20/16 1756  . vancomycin (VANCOCIN) IVPB 1000 mg/200 mL premix  1,000 mg Intravenous Q dialysis Lenis Noon, [facility]  Allergies: Allergies  Allergen Reactions  . Ibuprofen     DUE TO DIALYSIS  . Multivitamin [Centrum]     DUE TO DIALYSIS  . Daypro [Oxaprozin] Swelling and Rash    Other reaction(s): Other (See Comments)  . Tape Rash      Past Medical History: Past Medical History:  Diagnosis Date  . Anemia   . Arthritis   . Bladder cancer ([place])   . Chronic kidney disease   . Diabetes mellitus without complication ([place])   . Dialysis patient [facility])    M,W,F  . Dyspnea    DOE  . GERD (gastroesophageal reflux disease)   . HOH (hard of hearing)   . Hypertension   . IBS (irritable bowel syndrome)   . Lymphoma ([place]) 09/27/2014  . Neuropathy    RIGHT LEG  . Stroke [facility])    TIA     Past Surgical History: Past Surgical History:  Procedure Laterality Date  . BACK SURGERY  2007  . CATARACT EXTRACTION W/PHACO Left 03/23/2016   Procedure: CATARACT EXTRACTION PHACO AND INTRAOCULAR LENS PLACEMENT (IOC);  Surgeon: Leandrew Koyanagi, MD;  Location: [facility] ORS;  Service: Ophthalmology;  Laterality: Left;  [place] 52.6 AP% 14.7 CDE 7.72 Fluid pack lot # [id number]  . CATARACT EXTRACTION W/PHACO Right 05/30/2016   Procedure: CATARACT EXTRACTION PHACO AND INTRAOCULAR LENS PLACEMENT ([place]);  Surgeon: Leandrew Koyanagi, MD;  Location: [facility] ORS;  Service: Ophthalmology;  Laterality: Right;  [place] 01:08 AP% 13.9 CDE 9.53  note: could not get IV in patient, so procedure done without anesthesia personell present, ok per Dr Wallace Going fluid pack lo t# [id number]  . CIRCUMCISION    . PERIPHERAL VASCULAR CATHETERIZATION Left 08/19/2015   Procedure: A/V Shuntogram/Fistulagram;  Surgeon: Algernon Huxley, MD;  Location: [place] CV LAB;  Service: Cardiovascular;  Laterality: Left;  . PERIPHERAL VASCULAR CATHETERIZATION N/A 08/19/2015   Procedure: A/V Shunt  Intervention;  Surgeon: Algernon Huxley, MD;  Location: [place] CV LAB;  Service: Cardiovascular;  Laterality: N/A;     Family History: Family History  Problem Relation Age of Onset  . Cancer Mother   . Kidney cancer Neg Hx   . Prostate cancer Neg Hx   . Kidney failure Neg Hx   . Bladder Cancer Neg Hx      Social History: Social History   Social History  . Marital status: Married    Spouse name: N/A  . Number of children: N/A  . Years of education: N/A   Occupational History  . Not on file.   Social History Main Topics  . Smoking status: Former Smoker    Types: Cigarettes    Quit date: 05/19/2013  . Smokeless tobacco: Never Used     Comment: quit  . Alcohol use No  . Drug use: No  . Sexual activity: Not on file   Other Topics Concern  . Not on file   Social History Narrative  . No narrative on file     Review of Systems: Review of Systems  Constitutional: Positive for chills, fever and malaise/fatigue.  HENT: Negative for ear discharge, ear pain, hearing loss and tinnitus.   Eyes: Negative for blurred vision and double vision.  Respiratory: Negative for cough, hemoptysis and sputum production.   Cardiovascular: Negative for chest pain and palpitations.  Gastrointestinal: Negative for abdominal pain, heartburn, nausea and vomiting.  Genitourinary: Positive for dysuria.  Musculoskeletal: Negative for back pain and myalgias.  Skin: Negative for itching and rash.  Neurological: Negative  for dizziness and focal weakness.  Endo/Heme/Allergies: Negative for polydipsia. Does not bruise/bleed easily.  Psychiatric/Behavioral: Negative for depression. The patient is not nervous/anxious.      Vital Signs: Blood pressure (!) 130/56, pulse 74, temperature 98.2 F (36.8 C), temperature source Oral, resp. rate 16, height 5\' 10"  (1.778 m), weight 93.8 kg (206 lb 12.7 oz), SpO2 98 %.  Weight trends: Filed Weights   10/20/16 1126 10/21/16 0440  Weight: 93 kg (205  lb) 93.8 kg (206 lb 12.7 oz)    Physical Exam: General: NAD, sitting up in bed  Head: Normocephalic, atraumatic.  Eyes: Anicteric, EOMI  Nose: Mucous membranes moist, not inflammed, nonerythematous.  Throat: Oropharynx nonerythematous, no exudate appreciated.   Neck: Lymphadenopathy presen  Lungs:  Normal respiratory effort. Clear to auscultation BL without crackles or wheezes.  Heart: RRR. S1 and S2 normal without gallop, murmur, or rubs.  Abdomen:  BS normoactive. Soft, Nondistended, non-tender.  No masses or organomegaly.  Extremities: No pretibial edema.  Neurologic: A&O X3, Motor strength is 5/5 in the all 4 extremities  Skin: No visible rashes, scars.    Lab results: Basic Metabolic Panel:  Recent Labs Lab 10/20/16 1132 10/21/16 0400  NA 136 136  K 4.7 4.2  CL 98* 94*  CO2 26 29  GLUCOSE 260* 144*  BUN 51* 60*  CREATININE 8.18* 8.96*  CALCIUM 8.6* 8.5*    Liver Function Tests:  Recent Labs Lab 10/20/16 1132  AST 22  ALT 16*  ALKPHOS 51  BILITOT 0.6  PROT 6.6  ALBUMIN 3.9   No results for input(s): LIPASE, AMYLASE in the last 168 hours. No results for input(s): AMMONIA in the last 168 hours.  CBC:  Recent Labs Lab 10/20/16 1132 10/21/16 0400  WBC 7.4 6.7  NEUTROABS 5.8  --   HGB 11.3* 10.7*  HCT 32.5* 30.5*  MCV 97.5 96.8  PLT 86* 80*    Cardiac Enzymes:  Recent Labs Lab 10/20/16 1132 10/20/16 1657 10/20/16 2234  TROPONINI 0.05* 0.05* 0.04*    BNP: Invalid input(s): POCBNP  CBG:  Recent Labs Lab 10/20/16 1648 10/20/16 2103 10/21/16 0734  GLUCAP 123* 232* 56    Microbiology: Results for orders placed or performed during the hospital encounter of 10/20/16  Blood Culture (routine x 2)     Status: None (Preliminary result)   Collection Time: 10/20/16 11:39 AM  Result Value Ref Range Status   Specimen Description BLOOD BLOOD RIGHT HAND  Final   Special Requests   Final    BOTTLES DRAWN AEROBIC AND ANAEROBIC Blood Culture  adequate volume   Culture NO GROWTH < 24 HOURS  Final   Report Status PENDING  Incomplete  Blood Culture (routine x 2)     Status: None (Preliminary result)   Collection Time: 10/20/16 12:06 PM  Result Value Ref Range Status   Specimen Description BLOOD BLOOD LEFT WRIST  Final   Special Requests   Final    BOTTLES DRAWN AEROBIC AND ANAEROBIC Blood Culture adequate volume   Culture NO GROWTH < 24 HOURS  Final   Report Status PENDING  Incomplete  MRSA PCR Screening     Status: None   Collection Time: 10/20/16  6:58 PM  Result Value Ref Range Status   MRSA by PCR NEGATIVE NEGATIVE Final    Comment:        The GeneXpert MRSA Assay (FDA approved for NASAL specimens only), is one component of a comprehensive MRSA colonization surveillance program. It is not intended  to diagnose MRSA infection nor to guide or monitor treatment for MRSA infections.     Coagulation Studies: No results for input(s): LABPROT, INR in the last 72 hours.  Urinalysis:  Recent Labs  10/20/16 1132  COLORURINE YELLOW*  LABSPEC 1.012  PHURINE 5.0  GLUCOSEU NEGATIVE  HGBUR MODERATE*  BILIRUBINUR NEGATIVE  KETONESUR NEGATIVE  PROTEINUR 100*  NITRITE NEGATIVE  LEUKOCYTESUR LARGE*      Imaging: Dg Chest Port 1 View  Result Date: 10/20/2016 CLINICAL DATA:  Pt sent from dialysis for fever of 102. Did not receive dialysis today. Hx of bladder cancer, diabetes, HTN, stroke. Former smoker. EXAM: PORTABLE CHEST 1 VIEW COMPARISON:  PET-CT, 05/16/2016.  Chest radiograph, 10/15/2013. FINDINGS: Cardiac silhouette is normal in size and configuration. Thickening of the right paratracheal stripe is consistent with the adenopathy noted on the previous PET-CT. No hilar masses or convincing adenopathy. Clear lungs.  No convincing pleural effusion.  No pneumothorax. Skeletal structures are grossly intact. IMPRESSION: 1. No acute cardiopulmonary disease. Electronically Signed   By: Lajean Manes M.D.   On: 10/20/2016  12:16      Assessment & Plan: Pt is a 71 y.o. male with a PMHx of End-stage renal disease on hemodialysis MWF followed by [facility] nephrology, anemia chronic kidney disease, history of bladder cancer, diabetes mellitus type 2, GERD, hypertension, irritable bowel syndrome, history of lymphoma, history of TIA, who was admitted to [facility] on 10/20/2016 for evaluation of  dysuria and fever.   1. ESRD on HD MWF. Patient did not receive hemodialysis yesterday. As such we will plan for hemodialysis today. Orders have been prepared. Ultrafiltration target 1.5 kg. We will use his left upper extremity AV fistula as access.  2. Fever. Thought to be secondary to urinary tract infection. Urine culture still pending. Blood cultures thus far negative. In addition the patient has underlying lymphoma which could lead to fever. Continue anabolic therapy as per hospitalist at this point in time. Await further culture data.  3.  Anemia of chronic kidney disease. Hemoglobin currently 10.7. Hold off on Epogen for now.  4. Secondary hyperparathyroidism. Patient currently maintained on calcitriol 0.25 g by mouth MWF. Continue this for now. Follow-up serum phosphorus today.   5. Thanks for consultation.

## 2016-10-21 NOTE — Progress Notes (Signed)
PRE DIALYSIS ASSESSMENT 

## 2016-10-21 NOTE — Progress Notes (Signed)
[facility] [place] NAME: Mark [name]    MR#:  [id number]  [place]:  1945/07/02  SUBJECTIVE:   Patient here due to suspected sepsis from urinary tract infection. Afebrile today. Urine cultures are still pending. Patient does complain of some mild dysuria and frequency. Going for hemodialysis today as he missed his session yesterday.  REVIEW OF SYSTEMS:    Review of Systems  Constitutional: Negative for chills and fever.  HENT: Negative for congestion and tinnitus.   Eyes: Negative for blurred vision and double vision.  Respiratory: Negative for cough, shortness of breath and wheezing.   Cardiovascular: Negative for chest pain, orthopnea and PND.  Gastrointestinal: Negative for abdominal pain, diarrhea, nausea and vomiting.  Genitourinary: Negative for dysuria and hematuria.  Neurological: Negative for dizziness, sensory change and focal weakness.  All other systems reviewed and are negative.   Nutrition: Renal/Carb control Tolerating Diet: Yes Tolerating PT: Await Eval.   DRUG ALLERGIES:   Allergies  Allergen Reactions  . Ibuprofen     DUE TO DIALYSIS  . Multivitamin [Centrum]     DUE TO DIALYSIS  . Daypro [Oxaprozin] Swelling and Rash    Other reaction(s): Other (See Comments)  . Tape Rash    VITALS:  Blood pressure (!) 130/56, pulse 74, temperature 98.2 F (36.8 C), temperature source Oral, resp. rate 16, height '5\' 10"'  (1.778 m), weight 93.8 kg (206 lb 12.7 oz), SpO2 98 %.  PHYSICAL EXAMINATION:   Physical Exam  GENERAL:  71 y.o.-year-old obese patient lying in bed in no acute distress.  EYES: Pupils equal, round, reactive to light and accommodation. No scleral icterus. Extraocular muscles intact.  HEENT: Head atraumatic, normocephalic. Oropharynx and nasopharynx clear.  NECK:  Supple, no jugular venous distention. No thyroid enlargement, no tenderness.  LUNGS: Normal breath sounds bilaterally, no wheezing,  rales, rhonchi. No use of accessory muscles of respiration.  CARDIOVASCULAR: S1, S2 normal. No murmurs, rubs, or gallops.  ABDOMEN: Soft, nontender, nondistended. Bowel sounds present. No organomegaly or mass.  EXTREMITIES: No cyanosis, clubbing or edema b/l.    NEUROLOGIC: Cranial nerves II through XII are intact. No focal Motor or sensory deficits b/l.  Globally weak.  PSYCHIATRIC: The patient is alert and oriented x 3.  SKIN: No obvious rash, lesion, or ulcer.   Left upper ext. AV fistula with good bruit and thrill.    LABORATORY PANEL:   CBC  Recent Labs Lab 10/21/16 0400  WBC 6.7  HGB 10.7*  HCT 30.5*  PLT 80*   ------------------------------------------------------------------------------------------------------------------  Chemistries   Recent Labs Lab 10/20/16 1132 10/21/16 0400  NA 136 136  K 4.7 4.2  CL 98* 94*  CO2 26 29  GLUCOSE 260* 144*  BUN 51* 60*  CREATININE 8.18* 8.96*  CALCIUM 8.6* 8.5*  AST 22  --   ALT 16*  --   ALKPHOS 51  --   BILITOT 0.6  --    ------------------------------------------------------------------------------------------------------------------  Cardiac Enzymes  Recent Labs Lab 10/20/16 2234  TROPONINI 0.04*   ------------------------------------------------------------------------------------------------------------------  RADIOLOGY:  Dg Chest Port 1 View  Result Date: 10/20/2016 CLINICAL DATA:  Pt sent from dialysis for fever of 102. Did not receive dialysis today. Hx of bladder cancer, diabetes, HTN, stroke. Former smoker. EXAM: PORTABLE CHEST 1 VIEW COMPARISON:  PET-CT, 05/16/2016.  Chest radiograph, 10/15/2013. FINDINGS: Cardiac silhouette is normal in size and configuration. Thickening of the right paratracheal stripe is consistent with the adenopathy noted on the previous  PET-CT. No hilar masses or convincing adenopathy. Clear lungs.  No convincing pleural effusion.  No pneumothorax. Skeletal structures are grossly  intact. IMPRESSION: 1. No acute cardiopulmonary disease. Electronically Signed   By: Lajean Manes M.D.   On: 10/20/2016 12:16     ASSESSMENT AND PLAN:   71 year old male with past medical history of end-stage renal disease on hemodialysis, history of CVA, lymphoma, IBS, hypertension, diabetes, history of bladder cancer, osteoarthritis who presented to the hospital due to fever and noted to have sepsis secondary to UTI.  1. Sepsis-patient met criteria given his fever, tachycardia and elevated lactic acid. -The source of the sepsis is secondary to UTI. Initially patient received broad-spectrum IV antibiotics with vancomycin, Zosyn. MRSA PCR is negative we'll DC vancomycin. Change Zosyn to IV ceftriaxone for now. Follow cultures.  2. Urinary tract infection-placed on IV ceftriaxone today and will follow urine cultures.  3. End-stage renal disease on hemodialysis-nephrology has been consulted. Patient missed hemodialysis yesterday. He will get dialysis today.   4. Secondary hyperparathyroidism-continue PhosLo  5. Essential hypertension-continue metoprolol, Norvasc.  6. Diabetes type 2 without compensation-continue Levemir, sliding scale insulin.  7. History of previous CVA-continue aspirin.   All the records are reviewed and case discussed with Care Management/Social Worker. Management plans discussed with the patient, family and they are in agreement.  CODE STATUS: Full  DVT Prophylaxis: Heparin subcutaneous  TOTAL TIME TAKING CARE OF THIS PATIENT: 30 minutes.   POSSIBLE D/C IN 1-2 DAYS, DEPENDING ON CLINICAL CONDITION.   Henreitta Leber M.D on 10/21/2016 at 12:08 PM  Between 7am to 6pm - Pager - [phone]  After 6pm go to [email or web address] - Proofreader  [facility]  Office  [phone]  CC: Primary care physician; Marden Noble, MD

## 2016-10-21 NOTE — Progress Notes (Signed)
Hd completed 

## 2016-10-22 LAB — GLUCOSE, CAPILLARY
Glucose-Capillary: 125 mg/dL — ABNORMAL HIGH (ref 65–99)
Glucose-Capillary: 88 mg/dL (ref 65–99)
Glucose-Capillary: 165 mg/dL — ABNORMAL HIGH (ref 65–99)

## 2016-10-22 MED ORDER — CEFUROXIME AXETIL 250 MG PO TABS: 250.0000 mg | ORAL_TABLET | Freq: Two times a day (BID) | ORAL | 0 refills | Status: AC

## 2016-10-22 MED ORDER — DEXTROSE 50 % IV SOLN: 1.0000 | Freq: Once | INTRAVENOUS | Status: DC

## 2016-10-22 NOTE — Discharge Summary (Signed)
[facility] [place] NAME: Lamario Mani    MR#:  [id number]  DATE OF BIRTH:  1945-08-19  DATE OF ADMISSION:  10/20/2016 ADMITTING PHYSICIAN: Lance Coon, MD  DATE OF DISCHARGE: 10/22/2016  PRIMARY CARE PHYSICIAN: Marden Noble, MD    ADMISSION DIAGNOSIS:  End stage renal disease ([place]) [N18.6] Elevated troponin I level [R74.8] Cystitis [N30.90]  DISCHARGE DIAGNOSIS:  Principal Problem:   Sepsis ([place]) Active Problems:   End-stage renal disease on hemodialysis ([place])   Essential (primary) hypertension   HLD (hyperlipidemia)   Diabetes mellitus type 2, uncontrolled ([place])   UTI (urinary tract infection)   SECONDARY DIAGNOSIS:   Past Medical History:  Diagnosis Date  . Anemia   . Arthritis   . Bladder cancer ([place])   . Chronic kidney disease   . Diabetes mellitus without complication ([place])   . Dialysis patient [facility])    M,W,F  . Dyspnea    DOE  . GERD (gastroesophageal reflux disease)   . HOH (hard of hearing)   . Hypertension   . IBS (irritable bowel syndrome)   . Lymphoma ([place]) 09/27/2014  . Neuropathy    RIGHT LEG  . Stroke [facility])    TIA    HOSPITAL COURSE:   71 year old male with past medical history of end-stage renal disease on hemodialysis, history of CVA, lymphoma, IBS, hypertension, diabetes, history of bladder cancer, osteoarthritis who presented to the hospital due to fever and noted to have sepsis secondary to UTI.  1. Sepsis-patient met criteria given his fever, tachycardia and elevated lactic acid. The patient's source of sepsis was UTI. Although his urine, blood cultures have remained negative. -Initially patient was treated with broad-spectrum IV antibiotics with vancomycin, Zosyn. - MRSA PCR was negative and pt. Taken off Vancomycin.  Zosyn changed on IV Ceftriaxone.  Pt. Is clinically stable and now being discharged on Oral Ceftin.    2. Urinary tract infection- Treated with IV Ceftriaxone and now  being discharged on oral Ceftin.  - Urine cultures are (-).   3. End-stage renal disease on hemodialysis-nephrology was consulted as pt. Missed HD on Friday and received dialysis in the hospital on Saturday. He will resume his Monday Wednesday Friday schedule upon discharge.  4. Secondary hyperparathyroidism- he will continue PhosLo  5. Essential hypertension- he will continue metoprolol, Norvasc.  6. Diabetes type 2 without compensation- he will resume his levemir and Glimeperide upon discharge.  - BS remained stable while in hospital.   7. History of previous CVA- he will continue aspirin.  DISCHARGE CONDITIONS:   Stable  CONSULTS OBTAINED:    DRUG ALLERGIES:   Allergies  Allergen Reactions  . Ibuprofen     DUE TO DIALYSIS  . Multivitamin [Centrum]     DUE TO DIALYSIS  . Daypro [Oxaprozin] Swelling and Rash    Other reaction(s): Other (See Comments)  . Tape Rash    DISCHARGE MEDICATIONS:   Allergies as of 10/22/2016      Reactions   Ibuprofen    DUE TO DIALYSIS   Multivitamin [centrum]    DUE TO DIALYSIS   Daypro [oxaprozin] Swelling, Rash   Other reaction(s): Other (See Comments)   Tape Rash      Medication List    TAKE these medications   amLODipine 10 MG tablet Commonly known as:  NORVASC Take 10 mg by mouth daily.   aspirin 325 MG tablet Take 325 mg by mouth daily.   calcitRIOL 0.25 MCG capsule  Commonly known as:  ROCALTROL Take 0.25 mcg by mouth every Monday, Wednesday, and Friday.   calcium acetate 667 MG capsule Commonly known as:  PHOSLO Take 1,334-2,001 mg by mouth See admin instructions. 3 caps with meals and 2 caps with snacks   cefUROXime 250 MG tablet Commonly known as:  CEFTIN Take 1 tablet (250 mg total) by mouth 2 (two) times daily with a meal.   DUREZOL 0.05 % Emul Generic drug:  Difluprednate Place 1 drop into the left eye 4 (four) times daily.   ENSURE PLUS Liqd Take by mouth 3 (three) times a week. M,W.F    fluticasone 50 MCG/ACT nasal spray Commonly known as:  FLONASE Place 1 spray into both nostrils daily as needed for allergies.   furosemide 80 MG tablet Commonly known as:  LASIX Take 80-160 mg by mouth See admin instructions. 2 tablets in am 1 tablet in PM   gabapentin 100 MG capsule Commonly known as:  NEURONTIN Take 1 capsule (100 mg total) by mouth 3 (three) times daily. What changed:  how much to take  when to take this   glimepiride 4 MG tablet Commonly known as:  AMARYL Take 8 mg by mouth at bedtime.   LEVEMIR FLEXTOUCH 100 UNIT/ML Pen Generic drug:  Insulin Detemir Inject 25-30 Units into the skin daily at 10 pm. Depending on what he eats   lovastatin 20 MG tablet Commonly known as:  MEVACOR Take 20 mg by mouth at bedtime.   metoCLOPramide 5 MG tablet Commonly known as:  REGLAN Take 5 mg by mouth 2 (two) times daily. 1 TAB M,W,F 1TAB BID T,T,S,S   metoprolol tartrate 25 MG tablet Commonly known as:  LOPRESSOR Take 25 mg by mouth See admin instructions. Pt takes Tues morning, Friday Night, and Saturday morning and evening   multivitamin Tabs tablet Take 1 tablet by mouth at bedtime.   neomycin-polymyxin b-dexamethasone 3.5-10000-0.1 Oint Commonly known as:  MAXITROL Place 1 application into the left eye 2 (two) times daily.   prednisoLONE acetate 1 % ophthalmic suspension Commonly known as:  PRED FORTE Place 1 drop into the left eye 4 (four) times daily.   silver sulfADIAZINE 1 % cream Commonly known as:  SILVADENE Apply 1 application topically daily.         DISCHARGE INSTRUCTIONS:   DIET:  Cardiac diet and Renal diet  DISCHARGE CONDITION:  Stable  ACTIVITY:  Activity as tolerated  OXYGEN:  Home Oxygen: No.   Oxygen Delivery: room air  DISCHARGE LOCATION:  Home with Home health PT.    If you experience worsening of your admission symptoms, develop shortness of breath, life threatening emergency, suicidal or homicidal thoughts you  must seek medical attention immediately by calling 911 or calling your MD immediately  if symptoms less severe.  You Must read complete instructions/literature along with all the possible adverse reactions/side effects for all the Medicines you take and that have been prescribed to you. Take any new Medicines after you have completely understood and accpet all the possible adverse reactions/side effects.   Please note  You were cared for by a hospitalist during your hospital stay. If you have any questions about your discharge medications or the care you received while you were in the hospital after you are discharged, you can call the unit and asked to speak with the hospitalist on call if the hospitalist that took care of you is not available. Once you are discharged, your primary care physician will handle any  further medical issues. Please note that NO REFILLS for any discharge medications will be authorized once you are discharged, as it is imperative that you return to your primary care physician (or establish a relationship with a primary care physician if you do not have one) for your aftercare needs so that they can reassess your need for medications and monitor your lab values.     Today   Afebrile.  No acute events overnight. Feels better.  Will d/c home on Oral abx with Home Health services.   VITAL SIGNS:  Blood pressure (!) 118/52, pulse 86, temperature 98 F (36.7 C), temperature source Oral, resp. rate 18, height '5\' 10"'  (1.778 m), weight 93.7 kg (206 lb 8 oz), SpO2 98 %.  I/O:   Intake/Output Summary (Last 24 hours) at 10/22/16 1302 Last data filed at 10/22/16 1000  Gross per 24 hour  Intake              840 ml  Output             1710 ml  Net             -870 ml    PHYSICAL EXAMINATION:   GENERAL:  71 y.o.-year-old obese patient lying in bed in no acute distress.  EYES: Pupils equal, round, reactive to light and accommodation. No scleral icterus. Extraocular muscles  intact.  HEENT: Head atraumatic, normocephalic. Oropharynx and nasopharynx clear.  NECK:  Supple, no jugular venous distention. No thyroid enlargement, no tenderness.  LUNGS: Normal breath sounds bilaterally, no wheezing, rales, rhonchi. No use of accessory muscles of respiration.  CARDIOVASCULAR: S1, S2 normal. No murmurs, rubs, or gallops.  ABDOMEN: Soft, nontender, nondistended. Bowel sounds present. No organomegaly or mass.  EXTREMITIES: No cyanosis, clubbing or edema b/l.    NEUROLOGIC: Cranial nerves II through XII are intact. No focal Motor or sensory deficits b/l.  Globally weak.  PSYCHIATRIC: The patient is alert and oriented x 3.  SKIN: No obvious rash, lesion, or ulcer.   Left upper ext. AV fistula with good bruit and thrill.   DATA REVIEW:   CBC  Recent Labs Lab 10/21/16 0400  WBC 6.7  HGB 10.7*  HCT 30.5*  PLT 80*    Chemistries   Recent Labs Lab 10/20/16 1132 10/21/16 0400  NA 136 136  K 4.7 4.2  CL 98* 94*  CO2 26 29  GLUCOSE 260* 144*  BUN 51* 60*  CREATININE 8.18* 8.96*  CALCIUM 8.6* 8.5*  AST 22  --   ALT 16*  --   ALKPHOS 51  --   BILITOT 0.6  --     Cardiac Enzymes  Recent Labs Lab 10/20/16 2234  TROPONINI 0.04*    Microbiology Results  Results for orders placed or performed during the hospital encounter of 10/20/16  Blood Culture (routine x 2)     Status: None (Preliminary result)   Collection Time: 10/20/16 11:39 AM  Result Value Ref Range Status   Specimen Description BLOOD BLOOD RIGHT HAND  Final   Special Requests   Final    BOTTLES DRAWN AEROBIC AND ANAEROBIC Blood Culture adequate volume   Culture NO GROWTH 2 DAYS  Final   Report Status PENDING  Incomplete  Blood Culture (routine x 2)     Status: None (Preliminary result)   Collection Time: 10/20/16 12:06 PM  Result Value Ref Range Status   Specimen Description BLOOD BLOOD LEFT WRIST  Final   Special Requests   Final  BOTTLES DRAWN AEROBIC AND ANAEROBIC Blood Culture  adequate volume   Culture NO GROWTH 2 DAYS  Final   Report Status PENDING  Incomplete  Urine culture     Status: None   Collection Time: 10/20/16  1:10 PM  Result Value Ref Range Status   Specimen Description URINE, RANDOM  Final   Special Requests NONE  Final   Culture   Final    NO GROWTH Performed at [facility], [place] [address], [place], [place] [address]    Report Status 10/21/2016 FINAL  Final  MRSA PCR Screening     Status: None   Collection Time: 10/20/16  6:58 PM  Result Value Ref Range Status   MRSA by PCR NEGATIVE NEGATIVE Final    Comment:        The GeneXpert MRSA Assay (FDA approved for NASAL specimens only), is one component of a comprehensive MRSA colonization surveillance program. It is not intended to diagnose MRSA infection nor to guide or monitor treatment for MRSA infections.     RADIOLOGY:  No results found.    Management plans discussed with the patient, family and they are in agreement.  CODE STATUS:     Code Status Orders        Start     Ordered   10/20/16 1627  Full code  Continuous     10/20/16 1627    Code Status History    Date Active Date Inactive Code Status Order ID Comments User Context   This patient has a current code status but no historical code status.    Advance Directive Documentation     Most Recent Value  Type of Advance Directive  Living will, Healthcare Power of Attorney  Pre-existing out of facility DNR order (yellow form or pink MOST form)  -  "MOST" Form in Place?  -      TOTAL TIME TAKING CARE OF THIS PATIENT: 40 minutes.    Henreitta Leber M.D on 10/22/2016 at 1:02 PM  Between 7am to 6pm - Pager - [phone]  After 6pm go to [email or web address] - Proofreader  [facility]  Office  [phone]  CC: Primary care physician; Marden Noble, MD

## 2016-10-22 NOTE — Progress Notes (Signed)
[facility]  ROUNDING NOTE   Subjective:  Patient completed hemodialysis yesterday.  he tolerated this quite well. Resting comfortably in bed at the moment.   Objective:  Vital signs in last 24 hours:  Temp:  [98 F (36.7 C)-100.2 F (37.9 C)] 98.6 F (37 C) (07/22 1405) Pulse Rate:  [69-86] 76 (07/22 1405) Resp:  [12-19] 18 (07/22 1405) BP: (118-166)/(52-73) 155/55 (07/22 1405) SpO2:  [98 %-99 %] 99 % (07/22 1405) Weight:  [93.5 kg (206 lb 1.6 oz)-95.7 kg (210 lb 15.7 oz)] 93.7 kg (206 lb 8 oz) (07/22 0455)  Weight change: 2.713 kg (5 lb 15.7 oz) Filed Weights   10/21/16 1625 10/21/16 2024 10/22/16 0455  Weight: 95.7 kg (210 lb 15.7 oz) 93.5 kg (206 lb 1.6 oz) 93.7 kg (206 lb 8 oz)    Intake/Output: I/O last 3 completed shifts: In: 52 [P.O.:720; IV Piggyback:170] Out: 1885 [Urine:385; Other:1500]   Intake/Output this shift:  Total I/O In: 600 [P.O.:600] Out: -   Physical Exam: General: No acute distress  Head: Normocephalic, atraumatic. Moist oral mucosal membranes  Eyes: Anicteric  Neck: Supple, trachea midline  Lungs:  Clear to auscultation, normal effort  Heart: S1S2 no rubs  Abdomen:  Soft, nontender, bowel sounds present  Extremities: Trace peripheral edema.  Neurologic: Awake, alert, following commands  Skin: No lesions  Access: LUE AVF    Basic Metabolic Panel:  Recent Labs Lab 10/20/16 1132 10/21/16 0400 10/21/16 1624  NA 136 136  --   K 4.7 4.2  --   CL 98* 94*  --   CO2 26 29  --   GLUCOSE 260* 144*  --   BUN 51* 60*  --   CREATININE 8.18* 8.96*  --   CALCIUM 8.6* 8.5*  --   PHOS  --   --  4.3    Liver Function Tests:  Recent Labs Lab 10/20/16 1132  AST 22  ALT 16*  ALKPHOS 51  BILITOT 0.6  PROT 6.6  ALBUMIN 3.9   No results for input(s): LIPASE, AMYLASE in the last 168 hours. No results for input(s): AMMONIA in the last 168 hours.  CBC:  Recent Labs Lab 10/20/16 1132 10/21/16 0400  WBC 7.4 6.7   NEUTROABS 5.8  --   HGB 11.3* 10.7*  HCT 32.5* 30.5*  MCV 97.5 96.8  PLT 86* 80*    Cardiac Enzymes:  Recent Labs Lab 10/20/16 1132 10/20/16 1657 10/20/16 2234  TROPONINI 0.05* 0.05* 0.04*    BNP: Invalid input(s): POCBNP  CBG:  Recent Labs Lab 10/21/16 1141 10/21/16 2112 10/22/16 0306 10/22/16 0726 10/22/16 1135  GLUCAP 110* 147* 125* 27 165*    Microbiology: Results for orders placed or performed during the hospital encounter of 10/20/16  Blood Culture (routine x 2)     Status: None (Preliminary result)   Collection Time: 10/20/16 11:39 AM  Result Value Ref Range Status   Specimen Description BLOOD BLOOD RIGHT HAND  Final   Special Requests   Final    BOTTLES DRAWN AEROBIC AND ANAEROBIC Blood Culture adequate volume   Culture NO GROWTH 2 DAYS  Final   Report Status PENDING  Incomplete  Blood Culture (routine x 2)     Status: None (Preliminary result)   Collection Time: 10/20/16 12:06 PM  Result Value Ref Range Status   Specimen Description BLOOD BLOOD LEFT WRIST  Final   Special Requests   Final    BOTTLES DRAWN AEROBIC AND ANAEROBIC Blood Culture adequate volume  Culture NO GROWTH 2 DAYS  Final   Report Status PENDING  Incomplete  Urine culture     Status: None   Collection Time: 10/20/16  1:10 PM  Result Value Ref Range Status   Specimen Description URINE, RANDOM  Final   Special Requests NONE  Final   Culture   Final    NO GROWTH Performed at [facility], [place] [address] [place], [place] [address]    Report Status 10/21/2016 FINAL  Final  MRSA PCR Screening     Status: None   Collection Time: 10/20/16  6:58 PM  Result Value Ref Range Status   MRSA by PCR NEGATIVE NEGATIVE Final    Comment:        The GeneXpert MRSA Assay (FDA approved for NASAL specimens only), is one component of a comprehensive MRSA colonization surveillance program. It is not intended to diagnose MRSA infection nor to guide or monitor treatment for MRSA  infections.     Coagulation Studies: No results for input(s): LABPROT, INR in the last 72 hours.  Urinalysis:  Recent Labs  10/20/16 1132  COLORURINE YELLOW*  LABSPEC 1.012  PHURINE 5.0  GLUCOSEU NEGATIVE  HGBUR MODERATE*  BILIRUBINUR NEGATIVE  KETONESUR NEGATIVE  PROTEINUR 100*  NITRITE NEGATIVE  LEUKOCYTESUR LARGE*      Imaging: No results found.   Medications:   . cefTRIAXone (ROCEPHIN) 1 g IVPB Stopped (10/21/16 1925)   . amLODipine  10 mg Oral Daily  . aspirin  325 mg Oral Daily  . calcitRIOL  0.25 mcg Oral Q M,W,F  . calcium acetate  2,001 mg Oral TID WC  . fluticasone  1 spray Each Nare Daily  . gabapentin  200-300 mg Oral QHS  . heparin  5,000 Units Subcutaneous Q8H  . insulin aspart  0-5 Units Subcutaneous QHS  . insulin aspart  0-9 Units Subcutaneous TID WC  . insulin detemir  15 Units Subcutaneous QHS  . metoprolol tartrate  25 mg Oral Once per day on Tue Sat  . metoprolol tartrate  25 mg Oral Once per day on Fri Sat  . neomycin-polymyxin b-dexamethasone  1 application Left Eye BID  . pravastatin  20 mg Oral q1800   acetaminophen **OR** acetaminophen, calcium acetate, ondansetron **OR** ondansetron (ZOFRAN) IV  Assessment/ Plan:  71 y.o. male with a PMHx of End-stage renal disease on hemodialysis MWF followed by [facility] nephrology, anemia chronic kidney disease, history of bladder cancer, diabetes mellitus type 2, GERD, hypertension, irritable bowel syndrome, history of lymphoma, history of TIA, who was admitted to [facility] on 10/20/2016 for evaluation of  dysuria and fever.   1. ESRD on HD MWF. Patient completed hemodialysis yesterday. He will have dialysis again tomorrow as an outpatient.  2. Fever. Blood and urine cultures thus far negative. Antibiotic management as per hospitalist.  3.  Anemia of chronic kidney disease. Continue to monitor CBC as an outpatient.  4. Secondary hyperparathyroidism.  Phosphorus 4.3 and at target.  Continue calcium  acetate.   LOS: 2 Mark [name] 7/22/20182:23 PM

## 2016-10-22 NOTE — Care Management Note (Signed)
Case Management Note  Patient Details  Name: Grayer Sproles. MRN: [id number] Date of Birth: 11-18-1945  Subjective/Objective:   Discussed discharge planning with Mr Danielle Dess. He chose [facility] because he has used them in the past. A referral for HH-PT was called to Harlene Ramus at [facility]. No other needs idenbtified.                  Action/Plan:   Expected Discharge Date:  10/22/16               Expected Discharge Plan:   10/22/16  In-House Referral:     Discharge planning Services     Post Acute Care Choice:   YES Choice offered to:   Patient  DME Arranged:    DME Agency:     HH Arranged:   PT HH Agency:   [facility]  Status of Service:   completed  If discussed at [place] of Stay Meetings, dates discussed:    Additional Comments:  Kimbley Sprague A, RN 10/22/2016, 1:13 PM

## 2016-10-22 NOTE — Evaluation (Signed)
Physical Therapy Evaluation Patient Details Name: Mark [name]. MRN: [id number] DOB: 1945/08/15 Today's Date: 10/22/2016   History of Present Illness  Pt is a 71 y/o M who presented with confusion, generalized weakness.  Pt with UTI and meets sepsis criteria.  Pt's PMH includes blader cancer, dialysis patient, DOE, HOH, neuropathy, stroke, back surgery.    Clinical Impression  Pt admitted with above diagnosis. Pt currently with functional limitations due to the deficits listed below (see PT Problem List). [name] was agreeable to therapy and excited to ambulate.  He reports that he has 24/7 assist/supervision available at d/c.  Provided min guard assist for transfers and ambulation today.  He was able to ambulate 25 ft before onset of LLE fatigue (pt's limiting factor at baseline).  Pt reports this about the same distance as to his bathroom at home.  Pt will benefit from skilled PT to increase their independence and safety with mobility to allow discharge to the venue listed below.      Follow Up Recommendations Home health PT;Supervision for mobility/OOB    Equipment Recommendations  None recommended by PT    Recommendations for Other Services       Precautions / Restrictions Precautions Precautions: Fall Required Braces or Orthoses: Other Brace/Splint Other Brace/Splint: L AFO Restrictions Weight Bearing Restrictions: No      Mobility  Bed Mobility Overal bed mobility: Modified Independent             General bed mobility comments: Increased time and effort but no physical assist or cues needed.  Transfers Overall transfer level: Needs assistance Equipment used: Rolling walker (2 wheeled) Transfers: Sit to/from Stand Sit to Stand: Min guard         General transfer comment: Min guard for safety as pt is slow to stand with mild instability.  Proper hand placement and safe technique without cues required.    Ambulation/Gait Ambulation/Gait assistance:  Min guard Ambulation Distance (Feet): 25 Feet Assistive device: Rolling walker (2 wheeled) Gait Pattern/deviations: Step-to pattern;Step-through pattern;Decreased stride length;Decreased step length - left Gait velocity: decreased Gait velocity interpretation: Below normal speed for age/gender General Gait Details: Min guard for safety as pt reports his LLE fatiguing is typically what limits his ambulatory endurance.  Pt with slower but steady gait.  Distance limited by LLE fatigue.   Stairs            Wheelchair Mobility    Modified Rankin (Stroke Patients Only)       Balance Overall balance assessment: Needs assistance Sitting-balance support: No upper extremity supported;Feet supported Sitting balance-Leahy Scale: Good     Standing balance support: No upper extremity supported;During functional activity Standing balance-Leahy Scale: Fair Standing balance comment: Pt able to stand statically without UE support but does require UE support for dynamic activities                             Pertinent Vitals/Pain Pain Assessment: No/denies pain    Home Living Family/patient expects to be discharged to:: Private residence Living Arrangements: Spouse/significant other Available Help at Discharge: Family;Available 24 hours/day Type of Home: House Home Access: Ramped entrance       Home Equipment: Walker - 2 wheels;Walker - 4 wheels;Wheelchair - manual;Transport chair;Bedside commode;Shower seat;Grab bars - tub/shower;Grab bars - toilet;Hand held shower head;Cane - quad;Cane - single point      Prior Function Level of Independence: Independent with assistive device(s)  Comments: Pt ambulates ~50 ft with rollator.  Otherwise he uses either WC or transport chair.  Has family push him in the [facility] when he does have to use it.  He denies any falls in the past 6 months.  Wife does most of the cooking, cleaning, and she does the driving.  Pt able to shower  and dress independently.     Hand Dominance   Dominant Hand: Right    Extremity/Trunk Assessment   Upper Extremity Assessment Upper Extremity Assessment: Overall WFL for tasks assessed    Lower Extremity Assessment Lower Extremity Assessment: RLE deficits/detail;LLE deficits/detail RLE Deficits / Details: DF 2/5, otherwise strength grossly 3+/5 LLE Deficits / Details: LLE strength grossly 4/5       Communication   Communication: No difficulties  Cognition Arousal/Alertness: Awake/alert Behavior During Therapy: WFL for tasks assessed/performed Overall Cognitive Status: Within Functional Limits for tasks assessed                                        General Comments      Exercises     Assessment/Plan    PT Assessment Patient needs continued PT services  PT Problem List Decreased strength;Decreased activity tolerance;Decreased balance       PT Treatment Interventions DME instruction;Gait training;Therapeutic activities;Functional mobility training;Therapeutic exercise;Balance training;Neuromuscular re-education;Patient/family education;Wheelchair mobility training    PT Goals (Current goals can be found in the Care Plan section)  Acute Rehab PT Goals Patient Stated Goal: to go home PT Goal Formulation: With patient Time For Goal Achievement: 11/05/16 Potential to Achieve Goals: Good    Frequency Min 2X/week   Barriers to discharge        Co-evaluation               AM-PAC PT "6 Clicks" Daily Activity  Outcome Measure Difficulty turning over in bed (including adjusting bedclothes, sheets and blankets)?: None Difficulty moving from lying on back to sitting on the side of the bed? : None Difficulty sitting down on and standing up from a chair with arms (e.g., wheelchair, bedside commode, etc,.)?: A Little Help needed moving to and from a bed to chair (including a wheelchair)?: A Little Help needed walking in hospital room?: A  Little Help needed climbing 3-5 steps with a railing? : A Lot 6 Click Score: 19    End of Session Equipment Utilized During Treatment: Gait belt Activity Tolerance: Patient tolerated treatment well;Patient limited by fatigue Patient left: in chair;with call bell/phone within reach;with chair alarm set Nurse Communication: Mobility status PT Visit Diagnosis: Unsteadiness on feet (R26.81);Other abnormalities of gait and mobility (R26.89);Muscle weakness (generalized) (M62.81)    Time: 0762-2633 PT Time Calculation (min) (ACUTE ONLY): 33 min   Charges:   PT Evaluation $PT Eval Low Complexity: 1 Procedure PT Treatments $Gait Training: 8-22 mins   PT G Codes:        Collie Siad PT, DPT 10/22/2016, 12:58 PM

## 2016-10-22 NOTE — Progress Notes (Signed)
Pt discharged to home with family. Meds and D/C instructions reviewed. Appt with [facility] Monday 11:30am. Verifed with Dr. Holley Raring pre pt and daughter request.

## 2016-10-25 LAB — CULTURE, BLOOD (ROUTINE X 2)
CULTURE: NO GROWTH
Culture: NO GROWTH
SPECIAL REQUESTS: ADEQUATE
SPECIAL REQUESTS: ADEQUATE

## 2016-10-27 LAB — HEMOGLOBIN A1C
Hgb A1c MFr Bld: 7 % — ABNORMAL HIGH (ref 4.8–5.6)
Mean Plasma Glucose: 154 mg/dL

## 2016-10-27 LAB — PARATHYROID HORMONE, INTACT (NO CA): PTH: 93 pg/mL — AB (ref 15–65)

## 2016-11-16 ENCOUNTER — Inpatient Hospital Stay: Payer: Medicare HMO | Attending: Internal Medicine

## 2016-11-16 ENCOUNTER — Inpatient Hospital Stay (HOSPITAL_BASED_OUTPATIENT_CLINIC_OR_DEPARTMENT_OTHER): Payer: Medicare HMO | Admitting: Internal Medicine

## 2016-11-16 VITALS — BP 146/95 | HR 68 | Temp 97.6°F | Resp 22 | Ht 70.0 in | Wt 211.0 lb

## 2016-11-16 DIAGNOSIS — D649 Anemia, unspecified: Secondary | ICD-10-CM | POA: Diagnosis not present

## 2016-11-16 DIAGNOSIS — Z992 Dependence on renal dialysis: Secondary | ICD-10-CM | POA: Insufficient documentation

## 2016-11-16 DIAGNOSIS — C8303 Small cell B-cell lymphoma, intra-abdominal lymph nodes: Secondary | ICD-10-CM

## 2016-11-16 DIAGNOSIS — Z79899 Other long term (current) drug therapy: Secondary | ICD-10-CM | POA: Insufficient documentation

## 2016-11-16 DIAGNOSIS — K589 Irritable bowel syndrome without diarrhea: Secondary | ICD-10-CM | POA: Diagnosis not present

## 2016-11-16 DIAGNOSIS — M21371 Foot drop, right foot: Secondary | ICD-10-CM | POA: Insufficient documentation

## 2016-11-16 DIAGNOSIS — I120 Hypertensive chronic kidney disease with stage 5 chronic kidney disease or end stage renal disease: Secondary | ICD-10-CM | POA: Insufficient documentation

## 2016-11-16 DIAGNOSIS — K219 Gastro-esophageal reflux disease without esophagitis: Secondary | ICD-10-CM | POA: Diagnosis not present

## 2016-11-16 DIAGNOSIS — D696 Thrombocytopenia, unspecified: Secondary | ICD-10-CM | POA: Insufficient documentation

## 2016-11-16 DIAGNOSIS — Z87891 Personal history of nicotine dependence: Secondary | ICD-10-CM | POA: Insufficient documentation

## 2016-11-16 DIAGNOSIS — N186 End stage renal disease: Secondary | ICD-10-CM | POA: Insufficient documentation

## 2016-11-16 DIAGNOSIS — Z8673 Personal history of transient ischemic attack (TIA), and cerebral infarction without residual deficits: Secondary | ICD-10-CM | POA: Diagnosis not present

## 2016-11-16 DIAGNOSIS — Z794 Long term (current) use of insulin: Secondary | ICD-10-CM | POA: Insufficient documentation

## 2016-11-16 DIAGNOSIS — Z7982 Long term (current) use of aspirin: Secondary | ICD-10-CM | POA: Insufficient documentation

## 2016-11-16 LAB — CBC WITH DIFFERENTIAL/PLATELET
Basophils Absolute: 0 K/uL (ref 0–0.1)
Basophils Relative: 1 %
Eosinophils Absolute: 0.2 K/uL (ref 0–0.7)
Eosinophils Relative: 3 %
HCT: 30.3 % — ABNORMAL LOW (ref 40.0–52.0)
Hemoglobin: 10.6 g/dL — ABNORMAL LOW (ref 13.0–18.0)
Lymphocytes Relative: 33 %
Lymphs Abs: 2.1 K/uL (ref 1.0–3.6)
MCH: 33.7 pg (ref 26.0–34.0)
MCHC: 35.1 g/dL (ref 32.0–36.0)
MCV: 95.8 fL (ref 80.0–100.0)
Monocytes Absolute: 0.1 K/uL — ABNORMAL LOW (ref 0.2–1.0)
Monocytes Relative: 2 %
Neutro Abs: 3.9 K/uL (ref 1.4–6.5)
Neutrophils Relative %: 61 %
Platelets: 101 K/uL — ABNORMAL LOW (ref 150–440)
RBC: 3.16 MIL/uL — ABNORMAL LOW (ref 4.40–5.90)
RDW: 14.7 % — ABNORMAL HIGH (ref 11.5–14.5)
WBC: 6.4 K/uL (ref 3.8–10.6)

## 2016-11-16 LAB — BASIC METABOLIC PANEL WITH GFR
Anion gap: 10 (ref 5–15)
BUN: 27 mg/dL — ABNORMAL HIGH (ref 6–20)
CO2: 29 mmol/L (ref 22–32)
Calcium: 8.3 mg/dL — ABNORMAL LOW (ref 8.9–10.3)
Chloride: 95 mmol/L — ABNORMAL LOW (ref 101–111)
Creatinine, Ser: 5.52 mg/dL — ABNORMAL HIGH (ref 0.61–1.24)
GFR calc Af Amer: 11 mL/min — ABNORMAL LOW
GFR calc non Af Amer: 9 mL/min — ABNORMAL LOW
Glucose, Bld: 121 mg/dL — ABNORMAL HIGH (ref 65–99)
Potassium: 4.1 mmol/L (ref 3.5–5.1)
Sodium: 134 mmol/L — ABNORMAL LOW (ref 135–145)

## 2016-11-16 LAB — IRON AND TIBC
Iron: 67 ug/dL (ref 45–182)
Saturation Ratios: 30 % (ref 17.9–39.5)
TIBC: 225 ug/dL — ABNORMAL LOW (ref 250–450)
UIBC: 158 ug/dL

## 2016-11-16 LAB — LACTATE DEHYDROGENASE: LDH: 158 U/L (ref 98–192)

## 2016-11-16 LAB — FERRITIN: FERRITIN: 601 ng/mL — AB (ref 24–336)

## 2016-11-16 MED ORDER — IBRUTINIB 280 MG PO TABS: 1.0000 | ORAL_TABLET | Freq: Every day | ORAL | 4 refills | Status: DC

## 2016-11-16 NOTE — Progress Notes (Signed)
Pt c/o new enlarging lymph nodes-not painful in left neck

## 2016-11-16 NOTE — Assessment & Plan Note (Addendum)
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  FEB 2018- PET scan confirms mild progression in neck; pelvis [increase in right pelvic LN]; stable mediastinal LN. clinically patient noted to have worsening left neck LN; concerns for progression in neck. Patient however continues to be asymptomatic- except for mild to moderate thrombocytopenia/ also worsening anemia.   #  Had a long discussion the patient and his daughter regarding the treatment options including- Rituxan single agent; Gazyva monthly 6; with induction phase. Discussed the potential infusion reactions to both. I had a long discussion regarding use of Iburtinib- and the potential side effects including but not limited to easy bruising; A. Fib; an elevated blood pressure. After the long discussion patient/daughter seem to be interested in moving with treatment. However we'll get a PET scan in the next 2 weeks or so.   #Mild Platelets today 101; hemoglobin 10.6- /white count normal. Platelets are fairly stable; however hemoglobin seems to be dropping.  I suspect the worsening anemia is most likely from his progressive SLL rather than anemia of chronic kidney disease. We'll check iron studies. If normal- Aranesp is reasonable; however I think treating the underlying SLL should improve his hemoglobin.   #Noninvasive Bladder cancer/recent UTIs-currently resolved.  # PET in apx 2 weeks; follow up wit me fe days later; add iron studies/ferreitin to day labs. Start script for Ibrutinib.   CC: Dr.Kollueu.  Addendum: Iron studies are within normal limits; patient is not iron deficient.

## 2016-11-16 NOTE — Progress Notes (Signed)
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 Bone marrow biopsy on 03/05/13 with mildly hypercellular marrow for age 71-50% with interstitial predominantly small B-lymphocytic infiltrate estimated about 20-30% of marrow cells, mild nonspecific dyserythropoiesis, storage iron present this. Flow cytometry showed 29% CD5+ clonal B-cell population which is CD45+, CD5+, CD19+, CD20+, CD22+, CD23+, CD38+, HLA-DR+, Slg kappa+, and is CD10-, CD11b-, FMC7-. 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 PET scan on 03/03/13 with extensive hypermetabolic lymphadenopathy.  (presented with progressive Thrombocytopenia with persistent Anemia. CBC on 01/16/13 showed hemoglobin 10.0, MCV 98, platelets 80, WBC 4890 with 40% neutrophils, 54% lymphocytes, 1.4% monocytes. On Epogen and Venofer at dialysis treatments. Further workup showed small M-spike of 0.2).   Got Treanda/Rituxan x2 cycles in Dec 2014/Jan 2015. Then on single agent Rituxan q 8 weeks (got #3 dose on 09/16/13). --------------------------------------------------------------- ----------------------  # DEC 2014- SMALL CELL LYMPHOMA/CLL- STAGE IV [BMBx- 03/2013- 20-30% B cell infiltrate; CD 5+/CD 20+]; FISH- Trisomy 12;DEC 2014-Treanda-Rituxan x2 [held sec to prolonged hospital/pneumoani/HD]; Rituxa q8W [last June 2015; Held sec to Superficial Bladder ca]; CT- JUNE 2017- STABLE Mediastinal LN/supraclav LN/pelvic LN; CT DEC 2017- mild progression noted ~1.5-2cm mediastinal/Ax LN. 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   Patient has not had any hospitalizations. He currently has moved his care to Dr.Kolluru- for his dialysis needs. He is tolerating dialysis fairly well.   He however noticed to have increasing swelling of his left neck lymph nodes. Denies any pain.  Denies any bleeding episodes. Patient states his platelets have been greater than 100 while being checked in dialysis on a weekly basis.  Patient denies any unusual weight loss or night sweats. Denies any new lumps or bumps any where else. Denies any shortness of breath or cough.   However it had a recent UTI treated with antibiotics. Also noted to have erythema/discharge from his left eye- 4 which is followed up with ophthalmology.  REVIEW OF SYSTEMS:  A complete 10 point review of system is done which is negative except mentioned above/history of present illness.   PAST MEDICAL HISTORY :  Past Medical History:  Diagnosis Date  . Anemia   . Arthritis   . Bladder cancer ([place]   . Chronic kidney disease   . Diabetes mellitus without complication ([place]   . Dialysis patient ([facility]    M,W,F  . Dyspnea    DOE  . GERD (gastroesophageal reflux disease)   . HOH (hard of hearing)   . Hypertension   . IBS (irritable bowel syndrome)   . Lymphoma ([place] 09/27/2014  . Neuropathy    RIGHT LEG  . Stroke ([facility]    TIA    PAST SURGICAL HISTORY :   Past Surgical History:  Procedure Laterality Date  . BACK SURGERY  2007  . CATARACT EXTRACTION W/PHACO Left 03/23/2016   Procedure: CATARACT EXTRACTION PHACO AND INTRAOCULAR LENS PLACEMENT (IOC);  Surgeon: Leandrew Koyanagi, MD;  Location: [facility] ORS;  Service: Ophthalmology;  Laterality: Left;  [place] 52.6 AP% 14.7 CDE 7.72 Fluid pack lot # [id number]  . CATARACT EXTRACTION W/PHACO Right 05/30/2016   Procedure: CATARACT  EXTRACTION PHACO AND INTRAOCULAR LENS PLACEMENT ([place]);  Surgeon: Leandrew Koyanagi, MD;  Location: [facility] ORS;  Service: Ophthalmology;  Laterality: Right;  [place] 01:08 AP% 13.9 CDE 9.53  note: could not get IV in patient, so procedure done without anesthesia personell present, ok per Dr Wallace Going fluid pack lo t# [id number]  . CIRCUMCISION    . PERIPHERAL VASCULAR CATHETERIZATION Left 08/19/2015   Procedure: A/V Shuntogram/Fistulagram;  Surgeon: Algernon Huxley, MD;  Location: [place] CV LAB;  Service: Cardiovascular;  Laterality: Left;  . PERIPHERAL VASCULAR CATHETERIZATION N/A 08/19/2015   Procedure: A/V Shunt Intervention;  Surgeon: Algernon Huxley, MD;  Location: [place] CV LAB;  Service: Cardiovascular;  Laterality: N/A;    FAMILY HISTORY :   Family History  Problem Relation Age of Onset  . Cancer Mother   . Kidney cancer Neg Hx   . Prostate cancer Neg Hx   . Kidney failure Neg Hx   . Bladder Cancer Neg Hx     SOCIAL HISTORY:   Social History  Substance Use Topics  . Smoking status: Former Smoker    Types: Cigarettes    Quit date: 05/19/2013  . Smokeless tobacco: Never Used     Comment: quit  . Alcohol use No    ALLERGIES:  is allergic to ibuprofen; multivitamin [centrum]; daypro [oxaprozin]; and tape.  MEDICATIONS:  Current Outpatient Prescriptions  Medication Sig Dispense Refill  . amLODipine (NORVASC) 10 MG tablet Take 10 mg by mouth daily.     [name] aspirin 325 MG tablet Take 325 mg by mouth daily.    . calcium acetate (PHOSLO) 667 MG capsule Take 1,334-2,001 mg by mouth See admin instructions. 3 caps with meals and 2 caps with snacks    . Difluprednate (DUREZOL) 0.05 % EMUL Place 1 drop into the left eye 4 (four) times daily.    [name] ENSURE PLUS (ENSURE PLUS) LIQD Take by mouth 3 (three) times a week. M,W.F    . fluticasone (FLONASE) 50 MCG/ACT nasal spray Place 1 spray into both nostrils daily as needed for allergies.     . furosemide (LASIX) 80 MG tablet Take 80-160 mg  by mouth See admin instructions. 2 tablets in am 1 tablet in PM    . gabapentin (NEURONTIN) 100 MG capsule Take 1 capsule (100 mg total) by mouth 3 (three) times daily. (Patient taking differently: Take 200-300 mg by mouth 2 (two) times daily. ) 90 capsule 0  . glimepiride (AMARYL) 4 MG tablet Take 8 mg by mouth at bedtime.     [name] LEVEMIR FLEXTOUCH 100 UNIT/ML Pen Inject 25-30 Units into the skin daily at 10 pm. Depending on what he eats    . lovastatin (MEVACOR) 20 MG tablet Take 20 mg by mouth at bedtime.    . metoCLOPramide (REGLAN) 5 MG tablet Take 5 mg by mouth 2 (two) times daily. 1 TAB M,W,F 1TAB BID T,T,S,S    . metoprolol tartrate (LOPRESSOR) 25 MG tablet Take 25 mg by mouth See admin instructions. Pt takes Tues morning, Friday Night, and Saturday morning and evening    . multivitamin (RENA-VIT) TABS tablet Take 1 tablet by mouth at bedtime.     [name] neomycin-polymyxin b-dexamethasone (MAXITROL) 3.5-10000-0.1 OINT Place 1 application into the left eye 2 (two) times  daily.     . prednisoLONE acetate (PRED FORTE) 1 % ophthalmic suspension Place 1 drop into the left eye 4 (four) times daily.    . silver sulfADIAZINE (SILVADENE) 1 % cream Apply 1 application topically daily. 30 g 2  . Ibrutinib 280 MG TABS Take 1 capsule by mouth daily. 30 tablet 4   No current facility-administered medications for this visit.     PHYSICAL EXAMINATION: ECOG PERFORMANCE STATUS: 3  BP (!) 146/95 (Patient Position: Sitting)   Pulse 68   Temp 97.6 F (36.4 C) (Tympanic)   Resp (!) 22   Ht '5\' 10"'  (1.778 m)   Wt 211 lb (95.7 kg)   BMI 30.28 kg/m   Filed Weights   11/16/16 1151  Weight: 211 lb (95.7 kg)    GENERAL: Well-nourished well-developed; Alert, no distress and comfortable.  Accompanied by daughter; pt is in a wheel chair. EYES: no pallor or icterus OROPHARYNX: no thrush or ulceration; poor dentition.  NECK: supple, no masses felt LYMPH:  1-2 cm palpable lymphadenopathy in the bilateral  axillary. 3-4cm LN felet in neck.  LUNGS: clear to auscultation and  No wheeze or crackles HEART/CVS: regular rate & rhythm and no murmurs; No lower extremity edema;  R-LE in brace.  ABDOMEN:abdomen soft, non-tender and normal bowel sounds Musculoskeletal:no cyanosis of digits and no clubbing  PSYCH: alert & oriented x 3 with fluent speech NEURO: no focal motor/sensory deficits SKIN:  no rashes or significant lesions  LABORATORY DATA:  I have reviewed the data as listed    Component Value Date/Time   NA 134 (L) 01/12/2017 1122   NA 140 11/18/2013 1030   K 4.1 01/12/2017 1122   K 4.0 11/18/2013 1030   CL 95 (L) 01/12/2017 1122   CL 97 (L) 11/18/2013 1030   CO2 29 01/12/2017 1122   CO2 33 (H) 11/18/2013 1030   GLUCOSE 121 (H) 01/12/2017 1122   GLUCOSE 265 (H) 11/18/2013 1030   BUN 27 (H) 01/12/2017 1122   BUN 30 (H) 11/18/2013 1030   CREATININE 5.52 (H) 01/12/2017 1122   CREATININE 5.05 (H) 11/18/2013 1030   CALCIUM 8.3 (L) 01/12/2017 1122   CALCIUM 8.6 11/18/2013 1030   PROT 6.6 10/20/2016 1132   PROT 7.0 11/18/2013 1030   ALBUMIN 3.9 10/20/2016 1132   ALBUMIN 3.7 11/18/2013 1030   AST 22 10/20/2016 1132   AST 15 11/18/2013 1030   ALT 16 (L) 10/20/2016 1132   ALT 25 11/18/2013 1030   ALKPHOS 51 10/20/2016 1132   ALKPHOS 105 11/18/2013 1030   BILITOT 0.6 10/20/2016 1132   BILITOT 0.3 11/18/2013 1030   GFRNONAA 9 (L) 01/12/2017 1122   GFRNONAA 11 (L) 11/18/2013 1030   GFRAA 11 (L) 01/12/2017 1122   GFRAA 13 (L) 11/18/2013 1030    No results found for: SPEP, UPEP  Lab Results  Component Value Date   WBC 6.4 01/12/2017   NEUTROABS 3.9 01/12/2017   HGB 10.6 (L) 01/12/2017   HCT 30.3 (L) 01/12/2017   MCV 95.8 01/12/2017   PLT 101 (L) 01/12/2017      Chemistry      Component Value Date/Time   NA 134 (L) 01/12/2017 1122   NA 140 11/18/2013 1030   K 4.1 01/12/2017 1122   K 4.0 11/18/2013 1030   CL 95 (L) 01/12/2017 1122   CL 97 (L) 11/18/2013 1030   CO2 29  01/12/2017 1122   CO2 33 (H) 11/18/2013 1030   BUN 27 (H) 01/12/2017  1122   BUN 30 (H) 11/18/2013 1030   CREATININE 5.52 (H) 07/12/2016 1122   CREATININE 5.05 (H) 11/18/2013 1030      Component Value Date/Time   CALCIUM 8.3 (L) 07/12/2016 1122   CALCIUM 8.6 11/18/2013 1030   ALKPHOS 51 10/20/2016 1132   ALKPHOS 105 11/18/2013 1030   AST 22 10/20/2016 1132   AST 15 11/18/2013 1030   ALT 16 (L) 10/20/2016 1132   ALT 25 11/18/2013 1030   BILITOT 0.6 10/20/2016 1132   BILITOT 0.3 11/18/2013 1030     IIMPRESSION: 1. Mild Progression of chronic lymphocytic leukemia. Enlarged hypermetabolic LEFT level 2, level 3, and LEFT supraclavicular nodes. 2. Mild mediastinal lymphadenopathy is similar to most recent CT scan. 3. Interval enlargement mildly hypermetabolic RIGHT iliac lymph nodes increased in size splenic 09/07/2015. 4. No splenomegaly or bone involvement.   Electronically Signed   By: Suzy Bouchard M.D.   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  FEB 2018- PET scan confirms mild progression in neck; pelvis [increase in right pelvic LN]; stable mediastinal LN. clinically patient noted to have worsening left neck LN; concerns for progression in neck. Patient however continues to be asymptomatic- except for mild to moderate thrombocytopenia/ also worsening anemia.   #  Had a long discussion the patient and his daughter regarding the treatment options including- Rituxan single agent; Gazyva monthly 6; with induction phase. Discussed the potential infusion reactions to both. I had a long discussion regarding use of Iburtinib- and the potential side effects including but not limited to easy bruising; A. Fib; an elevated blood pressure. After the long discussion patient/daughter seem to be interested in moving with treatment. However we'll get a  PET scan in the next 2 weeks or so.   #Mild Platelets today 101; hemoglobin 10.6- /white count normal. Platelets are fairly stable; however hemoglobin seems to be dropping.  I suspect the worsening anemia is most likely from his progressive SLL rather than anemia of chronic kidney disease. We'll check iron studies. If normal- Aranesp is reasonable; however I think treating the underlying SLL should improve his hemoglobin.   #Noninvasive Bladder cancer/recent UTIs-currently resolved.  # PET in apx 2 weeks; follow up wit me fe days later; add iron studies/ferreitin to day labs. Start script for Ibrutinib.   CC: Dr.Kollueu.  Addendum: Iron studies are within normal limits; patient is not iron deficient.    Orders Placed This Encounter  Procedures  . NM PET Image Restag (PS) Skull Base To Thigh    Standing Status:   Future    Standing Expiration Date:   05/20/2017    Order Specific Question:   If indicated for the ordered procedure, I authorize the administration of a radiopharmaceutical per Radiology protocol    Answer:   Yes    Order Specific Question:   Preferred imaging location?    Answer:   [facility]    Order Specific Question:   Radiology Contrast Protocol - do NOT remove file path    Answer:   [email or web address]    Order Specific Question:   Reason for Exam additional comments    Answer:   small cell lymphoma  . Iron and TIBC    Standing Status:   Future    Number of Occurrences:   1    Standing Expiration Date:   05/20/2017  . Ferritin  Standing Status:   Future    Number of Occurrences:   1    Standing Expiration Date:   Jul 19, 202019       Cammie Sickle, MD Jul 19, 202018 10:26 PM

## 2016-11-17 ENCOUNTER — Encounter: Payer: Self-pay | Admitting: *Deleted

## 2016-11-17 NOTE — Progress Notes (Signed)
New rx for Imbruvica faxed to [facility]. Pharmacy at [phone].  Spoke with Vanessa [phone]. Pt unable to come to the oral chemotherapy class due to transportation issues. Daughter not available to bring patient on a Tuesday due to her job. Also patient on dialysis MWF. She states that it would be ideal if her father could have the education on the same day that he sees Dr. Rogue Bussing on 12/07/16. [name] made aware that the pharmacy team would be reaching out to her to further discuss pt's out of pocket cost, potential foundations available for drug cost and pt education. Pt would like [name] to be the main contact person as he wants to make sure that he gets all the information recorded down on his behalf.

## 2016-11-20 ENCOUNTER — Telehealth: Payer: Self-pay | Admitting: Pharmacist

## 2016-11-20 DIAGNOSIS — C8303 Small cell B-cell lymphoma, intra-abdominal lymph nodes: Secondary | ICD-10-CM

## 2016-11-20 MED ORDER — IBRUTINIB 280 MG PO TABS: 1.0000 | ORAL_TABLET | Freq: Every day | ORAL | 4 refills | Status: DC

## 2016-11-20 NOTE — Telephone Encounter (Signed)
Oral Oncology Pharmacist Encounter  Received new prescription for Ibrutinib for the treatment of  in conjunction with SLL/CLL, planned duration until disease progression or unacceptable drug toxicity.  CBC/diff/BMP from 11/16/16 and CMP from 10/20/16 assessed, .  Current medication list in Epic reviewed, one DDI with aspirin identified. Patient should be monitored more bleeding whileon the combination of aspirin and ibrutinib.  Prescription has been e-scribed to the [facility] for benefits analysis and approval.  Oral Oncology Clinic will continue to follow for insurance authorization, copayment issues, initial counseling and start date.  Darl Pikes, PharmD, BCPS Hematology/Oncology Clinical Pharmacist [facility] [phone]  11/20/2016 9:23 AM

## 2016-11-21 NOTE — Telephone Encounter (Signed)
Oral Chemotherapy Pharmacist Encounter  I spoke with patient's daughter Mark [name] for overview of new oral chemotherapy medication: Ibruitinib for the treatment of SLL/CLL, planned duration until disease progression or unacceptable drug toxicity.  Counseled daughter on patient on administration, dosing, side effects, safe handling, and monitoring. Patient will take 280mg  (1 tablet) by mouth daily.  Side effects include but not limited to: rash, N/V/D, constipation, fatigue, bruising/bleeding.   Patient is currently on dialysis and is being started are a lower dose due to his renal dysfunction. Encouraged daughter to make sure her father is hydrating within the limits of the set by his renal provider. Per Mark [name] he currently does not drink that much fluid.   Reviewed with patient importance of keeping a medication schedule and plan for any missed doses.  Mark [name] voiced understanding and appreciation. All questions answered.  The PA for this drug was approved and Melony, patient advocate, will follow up with patient regarding copay and medication delivery.   Patient knows to call the office with questions or concerns. Oral Oncology Clinic will continue to follow.  Thank you,  Nuala Alpha, PharmD, BCPS 11/21/2016  12:09 PM Oral Oncology Clinic [phone]

## 2016-11-21 NOTE — Telephone Encounter (Signed)
Oral Chemotherapy Pharmacist Encounter   Attempted to reach patient's daughter [name]. Per EPIC documentation patient wanted daughter to be the person contacted with information about his new oral medication: Ibrutinib. No answer. Left VM for daughter to call back.   Received PA for Ibrutinib today through CoverMyMeds. Copay information pending.   Thank you,  Darl Pikes, PharmD, BCPS Hematology/Oncology Clinical Pharmacist [facility] [phone]  11/21/2016 10:15 AM

## 2016-11-23 ENCOUNTER — Telehealth: Payer: Self-pay | Admitting: Internal Medicine

## 2016-11-23 NOTE — Telephone Encounter (Addendum)
Oral Oncology Patient Advocate Encounter  Prior Authorization for [name] has been approved.    PA# [id number]  Effective dates: 11/23/2016 through 11/23/2017  Patients co-pay is $2153.01.  Oral Oncology Patient Advocate Encounter  Was successful in securing patient an $ 7600.00 grant from [employer] to provide copayment coverage for his Imbruvica.  This will keep the out of pocket expense at $0.    I have spoken with the patients daughter.    The billing information is as follows and has been shared with [facility].   Member ID: [id number] Group ID: [id number] RxBin: [id number] Dates of Eligibility: 08/25/2016 through 11/22/2017

## 2016-11-30 ENCOUNTER — Encounter
Admission: RE | Admit: 2016-11-30 | Discharge: 2016-11-30 | Disposition: A | Discharge status: Home / Self Care | Payer: Medicare HMO | Source: Ambulatory Visit | Attending: Internal Medicine | Admitting: Internal Medicine

## 2016-11-30 DIAGNOSIS — C8303 Small cell B-cell lymphoma, intra-abdominal lymph nodes: Secondary | ICD-10-CM | POA: Insufficient documentation

## 2016-11-30 LAB — GLUCOSE, CAPILLARY: Glucose-Capillary: 107 mg/dL — ABNORMAL HIGH (ref 65–99)

## 2016-11-30 MED ORDER — FLUDEOXYGLUCOSE F - 18 (FDG) INJECTION
12.5700 | Freq: Once | INTRAVENOUS | Status: AC | PRN
  Administered 2016-11-30: 12.57 via INTRAVENOUS

## 2016-12-07 ENCOUNTER — Inpatient Hospital Stay: Payer: Medicare HMO | Attending: Internal Medicine | Admitting: Internal Medicine

## 2016-12-07 VITALS — Ht 70.0 in | Wt 212.0 lb

## 2016-12-07 DIAGNOSIS — Z7982 Long term (current) use of aspirin: Secondary | ICD-10-CM | POA: Insufficient documentation

## 2016-12-07 DIAGNOSIS — N186 End stage renal disease: Secondary | ICD-10-CM | POA: Insufficient documentation

## 2016-12-07 DIAGNOSIS — D696 Thrombocytopenia, unspecified: Secondary | ICD-10-CM | POA: Insufficient documentation

## 2016-12-07 DIAGNOSIS — I2510 Atherosclerotic heart disease of native coronary artery without angina pectoris: Secondary | ICD-10-CM | POA: Insufficient documentation

## 2016-12-07 DIAGNOSIS — E1122 Type 2 diabetes mellitus with diabetic chronic kidney disease: Secondary | ICD-10-CM | POA: Insufficient documentation

## 2016-12-07 DIAGNOSIS — B9620 Unspecified Escherichia coli [E. coli] as the cause of diseases classified elsewhere: Secondary | ICD-10-CM | POA: Diagnosis not present

## 2016-12-07 DIAGNOSIS — N281 Cyst of kidney, acquired: Secondary | ICD-10-CM | POA: Insufficient documentation

## 2016-12-07 DIAGNOSIS — D649 Anemia, unspecified: Secondary | ICD-10-CM | POA: Diagnosis not present

## 2016-12-07 DIAGNOSIS — K219 Gastro-esophageal reflux disease without esophagitis: Secondary | ICD-10-CM | POA: Diagnosis not present

## 2016-12-07 DIAGNOSIS — Z79899 Other long term (current) drug therapy: Secondary | ICD-10-CM | POA: Insufficient documentation

## 2016-12-07 DIAGNOSIS — M21371 Foot drop, right foot: Secondary | ICD-10-CM | POA: Insufficient documentation

## 2016-12-07 DIAGNOSIS — Z8673 Personal history of transient ischemic attack (TIA), and cerebral infarction without residual deficits: Secondary | ICD-10-CM | POA: Insufficient documentation

## 2016-12-07 DIAGNOSIS — Z87891 Personal history of nicotine dependence: Secondary | ICD-10-CM | POA: Diagnosis not present

## 2016-12-07 DIAGNOSIS — K589 Irritable bowel syndrome without diarrhea: Secondary | ICD-10-CM | POA: Diagnosis not present

## 2016-12-07 DIAGNOSIS — C8303 Small cell B-cell lymphoma, intra-abdominal lymph nodes: Secondary | ICD-10-CM | POA: Insufficient documentation

## 2016-12-07 DIAGNOSIS — Z794 Long term (current) use of insulin: Secondary | ICD-10-CM | POA: Diagnosis not present

## 2016-12-07 DIAGNOSIS — I120 Hypertensive chronic kidney disease with stage 5 chronic kidney disease or end stage renal disease: Secondary | ICD-10-CM | POA: Diagnosis not present

## 2016-12-07 DIAGNOSIS — Z992 Dependence on renal dialysis: Secondary | ICD-10-CM | POA: Diagnosis not present

## 2016-12-07 NOTE — Assessment & Plan Note (Addendum)
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  SEP 2018- PET scan Progression in neck bilaterally;  pelvis [increase in right pelvic LN]; stable mediastinal LN. Patient noted to have worsening lymphadenopathy on imaging/clinically; worsening anemia/thrombocytopenia.  # Plan start treatment with ibrutinib 280 mg/once a day [given end-stage renal disease/multiple comorbidities]; however given the infectious issues [C discussion below]; recommend starting ibrutinib only after the infectious issues are resolved. Understands treatments are palliative. Discussed the potential side effects including but not limited to easy bruising; cardiac arrhythmias; rare infections.   # ? E.coli infection/bacteremia/sepsis-currently on IV antibiotics with nephrology. Discussed with Dr.Kolluru.   # Left eye keratitis- on topical anti-viral. As per ophthalmology.  # follow up in 3 weeks. Discussed with Dr. Juleen China.   CC: Dr.Kolluru; Dr.Brasington, [place] -opthalm  # I reviewed the blood work- with the patient in detail; also reviewed the imaging independently [as summarized above]; and with the patient in detail.

## 2016-12-07 NOTE — Progress Notes (Signed)
Pt currently being tx for ecoli - tx at dialysis unit with IV ceftazidime 1000 mg - 3 week times 2 weeks. Pt states that he has been having bleeding from his fistula s/p dialysis. He has applied reinforcement dressing and some pressure to control bleeding. Patient received heparin in his dialysis line today. He also received epogen 4800 units today.

## 2016-12-07 NOTE — Progress Notes (Signed)
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 Bone marrow biopsy on 03/05/13 with mildly hypercellular marrow for age 71-50% with interstitial predominantly small B-lymphocytic infiltrate estimated about 20-30% of marrow cells, mild nonspecific dyserythropoiesis, storage iron present this. Flow cytometry showed 29% CD5+ clonal B-cell population which is CD45+, CD5+, CD19+, CD20+, CD22+, CD23+, CD38+, HLA-DR+, Slg kappa+, and is CD10-, CD11b-, FMC7-. 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 PET scan on 03/03/13 with extensive hypermetabolic lymphadenopathy.  (presented with progressive Thrombocytopenia with persistent Anemia. CBC on 01/16/13 showed hemoglobin 10.0, MCV 98, platelets 80, WBC 4890 with 40% neutrophils, 54% lymphocytes, 1.4% monocytes. On Epogen and Venofer at dialysis treatments. Further workup showed small M-spike of 0.2).   Got Treanda/Rituxan x2 cycles in Dec 2014/Jan 2015. Then on single agent Rituxan q 8 weeks (got #3 dose on 09/16/13). --------------------------------------------------------------- ----------------------  # DEC 2014- SMALL CELL LYMPHOMA/CLL- STAGE IV [BMBx- 03/2013- 20-30% B cell infiltrate; CD 5+/CD 20+]; FISH- Trisomy 12;DEC 2014-Treanda-Rituxan x2 [held sec to prolonged hospital/pneumoani/HD]; Rituxa q8W [last June 2015; Held sec to Superficial Bladder ca]; CT- JUNE 2017- STABLE Mediastinal LN/supraclav LN/pelvic LN; CT DEC 2017- mild progression noted ~1.5-2cm mediastinal/Ax LN. Cont surveillance  # SEP 2018- PROGRESSION on PET scan-   # 36m RLL lung nodule- F/u in 6 M [Hx of smoking quit 2015]  # ESRD on HD [Dr.Voora/Dr.Kolluru]; DM; Non-healing ulcers in feet/Hx R foot drop- walker ambulation  #Noninvasive Bladder  cancer/recent UTIs-currently resolved.     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   The interim patient has been diagnosed with left eye keratitis- followed by ophthalmology. Patient is currently on topical antiviral.   Patient also has been diagnosed with Escherichia coli infection the blood/blood cultures- currently on IV antibiotics as per nephrology. He currently denies any fevers.  He however noticed to have increasing swelling of his left neck lymph nodes. Denies any pain.  Denies any bleeding episodes. Patient states his platelets have been greater than 100 while being checked in dialysis on a weekly basis.  Patient denies any unusual weight loss or night sweats. Denies any new lumps or bumps any where else. Denies any shortness of breath or cough.    REVIEW OF SYSTEMS:  A complete 10 point review of system is done which is negative except mentioned above/history of present illness.   PAST MEDICAL HISTORY :  Past Medical History:  Diagnosis Date  . Anemia   . Arthritis   . Bladder cancer ([place]   . Chronic kidney disease   . Diabetes mellitus without complication ([facility]   . Dialysis patient ([facility]    M,W,F  . Dyspnea    DOE  . GERD (gastroesophageal reflux disease)   . HOH (hard of hearing)   . Hypertension   . IBS (irritable bowel syndrome)   . Lymphoma ([place] 09/27/2014  . Neuropathy    RIGHT LEG  . Stroke ([facility]    TIA    PAST SURGICAL HISTORY :   Past Surgical History:  Procedure Laterality Date  . BACK SURGERY  2007  .  CATARACT EXTRACTION W/PHACO Left 03/23/2016   Procedure: CATARACT EXTRACTION PHACO AND INTRAOCULAR LENS PLACEMENT (IOC);  Surgeon: Leandrew Koyanagi, MD;  Location: [facility] ORS;  Service: Ophthalmology;  Laterality:  Left;  [place] 52.6 AP% 14.7 CDE 7.72 Fluid pack lot # [id number]  . CATARACT EXTRACTION W/PHACO Right 05/30/2016   Procedure: CATARACT EXTRACTION PHACO AND INTRAOCULAR LENS PLACEMENT ([place]);  Surgeon: Leandrew Koyanagi, MD;  Location: [facility] ORS;  Service: Ophthalmology;  Laterality: Right;  [place] 01:08 AP% 13.9 CDE 9.53  note: could not get IV in patient, so procedure done without anesthesia personell present, ok per Dr Wallace Going fluid pack lo t# [id number]  . CIRCUMCISION    . PERIPHERAL VASCULAR CATHETERIZATION Left 08/19/2015   Procedure: A/V Shuntogram/Fistulagram;  Surgeon: Algernon Huxley, MD;  Location: [facility] CV LAB;  Service: Cardiovascular;  Laterality: Left;  . PERIPHERAL VASCULAR CATHETERIZATION N/A 08/19/2015   Procedure: A/V Shunt Intervention;  Surgeon: Algernon Huxley, MD;  Location: [place] CV LAB;  Service: Cardiovascular;  Laterality: N/A;    FAMILY HISTORY :   Family History  Problem Relation Age of Onset  . Cancer Mother   . Kidney cancer Neg Hx   . Prostate cancer Neg Hx   . Kidney failure Neg Hx   . Bladder Cancer Neg Hx     SOCIAL HISTORY:   Social History  Substance Use Topics  . Smoking status: Former Smoker    Types: Cigarettes    Quit date: 05/19/2013  . Smokeless tobacco: Never Used     Comment: quit  . Alcohol use No    ALLERGIES:  is allergic to ibuprofen; multivitamin [centrum]; daypro [oxaprozin]; and tape.  MEDICATIONS:  Current Outpatient Prescriptions  Medication Sig Dispense Refill  . amLODipine (NORVASC) 10 MG tablet Take 10 mg by mouth daily.     [name] aspirin 325 MG tablet Take 325 mg by mouth daily.    . calcium acetate (PHOSLO) 667 MG capsule Take 1,334-2,001 mg by mouth See admin instructions. 3 caps with meals and 2 caps with snacks    . CEFTAZIDIME IV Inject into the vein. Three times a week x 2 weeks at dialysis    . Difluprednate (DUREZOL) 0.05 % EMUL Place 1 drop into the left eye 4 (four) times daily.    [name] ENSURE PLUS (ENSURE PLUS)  LIQD Take by mouth 3 (three) times a week. M,W.F    . Epoetin Alfa (EPOGEN IJ) Inject 4,800 Units as directed every 21 ( twenty-one) days.    . fluticasone (FLONASE) 50 MCG/ACT nasal spray Place 1 spray into both nostrils daily as needed for allergies.     . furosemide (LASIX) 80 MG tablet Take 80-160 mg by mouth See admin instructions. 2 tablets in am 1 tablet in PM    . gabapentin (NEURONTIN) 100 MG capsule Take 1 capsule (100 mg total) by mouth 3 (three) times daily. (Patient taking differently: Take 200-300 mg by mouth 2 (two) times daily. ) 90 capsule 0  . glimepiride (AMARYL) 4 MG tablet Take 8 mg by mouth at bedtime.     [name] LEVEMIR FLEXTOUCH 100 UNIT/ML Pen Inject 25-30 Units into the skin daily at 10 pm. Depending on what he eats    . methylphenidate (RITALIN) 5 MG tablet Take 1 tablet by mouth daily.    . metoCLOPramide (REGLAN) 5 MG tablet Take 5 mg by mouth 2 (two) times daily. 1 TAB M,W,F 1TAB BID T,T,S,S    . metoprolol tartrate (LOPRESSOR) 25 MG tablet Take  25 mg by mouth See admin instructions. Pt takes Tues morning, Friday Night, and Saturday morning and evening    . multivitamin (RENA-VIT) TABS tablet Take 1 tablet by mouth at bedtime.     [name] neomycin-polymyxin b-dexamethasone (MAXITROL) 3.5-10000-0.1 OINT Place 1 application into the left eye 2 (two) times daily.     . pravastatin (PRAVACHOL) 20 MG tablet Take 20 mg by mouth daily.    . silver sulfADIAZINE (SILVADENE) 1 % cream Apply 1 application topically daily. 30 g 2  . ZIRGAN 0.15 % GEL Place 1 application into the left eye 3 (three) times daily as needed.    . Ibrutinib 280 MG TABS Take 1 capsule by mouth daily. (Patient not taking: Reported on 12/07/2016) 30 tablet 4  . prednisoLONE acetate (PRED FORTE) 1 % ophthalmic suspension Place 1 drop into the left eye 4 (four) times daily.     No current facility-administered medications for this visit.     PHYSICAL EXAMINATION: ECOG PERFORMANCE STATUS: 3  Ht '5\' 10"'  (1.778 m)    Wt 212 lb (96.2 kg)   BMI 30.42 kg/m   Filed Weights   12/07/16 1130  Weight: 212 lb (96.2 kg)    GENERAL: Well-nourished well-developed; Alert, no distress and comfortable.  Accompanied by daughter; pt is in a wheel chair. EYES: no pallor or icterus OROPHARYNX: no thrush or ulceration; poor dentition.  NECK: supple, no masses felt LYMPH:  1-2 cm palpable lymphadenopathy in the bilateral axillary. 3-4cm LN felet in neck.  LUNGS: clear to auscultation and  No wheeze or crackles HEART/CVS: regular rate & rhythm and no murmurs; No lower extremity edema;  R-LE in brace.  ABDOMEN:abdomen soft, non-tender and normal bowel sounds Musculoskeletal:no cyanosis of digits and no clubbing  PSYCH: alert & oriented x 3 with fluent speech NEURO: no focal motor/sensory deficits SKIN:  no rashes or significant lesions  LABORATORY DATA:  I have reviewed the data as listed    Component Value Date/Time   NA 134 (L) 05-Oct-202018 1122   NA 140 11/18/2013 1030   K 4.1 05-Oct-202018 1122   K 4.0 11/18/2013 1030   CL 95 (L) 05-Oct-202018 1122   CL 97 (L) 11/18/2013 1030   CO2 29 05-Oct-202018 1122   CO2 33 (H) 11/18/2013 1030   GLUCOSE 121 (H) 05-Oct-202018 1122   GLUCOSE 265 (H) 11/18/2013 1030   BUN 27 (H) 05-Oct-202018 1122   BUN 30 (H) 11/18/2013 1030   CREATININE 5.52 (H) 05-Oct-202018 1122   CREATININE 5.05 (H) 11/18/2013 1030   CALCIUM 8.3 (L) 05-Oct-202018 1122   CALCIUM 8.6 11/18/2013 1030   PROT 6.6 10/20/2016 1132   PROT 7.0 11/18/2013 1030   ALBUMIN 3.9 10/20/2016 1132   ALBUMIN 3.7 11/18/2013 1030   AST 22 10/20/2016 1132   AST 15 11/18/2013 1030   ALT 16 (L) 10/20/2016 1132   ALT 25 11/18/2013 1030   ALKPHOS 51 10/20/2016 1132   ALKPHOS 105 11/18/2013 1030   BILITOT 0.6 10/20/2016 1132   BILITOT 0.3 11/18/2013 1030   GFRNONAA 9 (L) 05-Oct-202018 1122   GFRNONAA 11 (L) 11/18/2013 1030   GFRAA 11 (L) 05-Oct-202018 1122   GFRAA 13 (L) 11/18/2013 1030    No results found for: SPEP, UPEP  Lab Results   Component Value Date   WBC 6.4 05-Oct-202018   NEUTROABS 3.9 05-Oct-202018   HGB 10.6 (L) 05-Oct-202018   HCT 30.3 (L) 05-Oct-202018   MCV 95.8 05-Oct-202018   PLT 101 (L) 05-Oct-202018  Chemistry      Component Value Date/Time   NA 134 (L) 02-Feb-202018 1122   NA 140 11/18/2013 1030   K 4.1 02-Feb-202018 1122   K 4.0 11/18/2013 1030   CL 95 (L) 02-Feb-202018 1122   CL 97 (L) 11/18/2013 1030   CO2 29 02-Feb-202018 1122   CO2 33 (H) 11/18/2013 1030   BUN 27 (H) 02-Feb-202018 1122   BUN 30 (H) 11/18/2013 1030   CREATININE 5.52 (H) 02-Feb-202018 1122   CREATININE 5.05 (H) 11/18/2013 1030      Component Value Date/Time   CALCIUM 8.3 (L) 02-Feb-202018 1122   CALCIUM 8.6 11/18/2013 1030   ALKPHOS 51 10/20/2016 1132   ALKPHOS 105 11/18/2013 1030   AST 22 10/20/2016 1132   AST 15 11/18/2013 1030   ALT 16 (L) 10/20/2016 1132   ALT 25 11/18/2013 1030   BILITOT 0.6 10/20/2016 1132   BILITOT 0.3 11/18/2013 1030     IMPRESSION: 1. Further enlargement of pathologic adenopathy in the neck, chest, abdomen, and pelvis, but with similar Deauville 4 level of metabolic activity compared to the prior exam, compatible with active lymphoma. No bony involvement or splenomegaly identified. 2. 6 by 4 mm left upper lobe pulmonary nodule appears to be new and does not have hypermetabolic activity currently, surveillance suggested. 3. Several findings of indeterminate significance include some bandlike probable atelectasis or scarring medially in the right lower lobe with faintly accentuated activity, as well as some focal subcutaneous activity in stranding in the right lower quadrant. 4. Other imaging findings of potential clinical significance: Aortic Atherosclerosis (ICD10-I70.0). Right renal cysts. Coronary atherosclerosis.   Electronically Signed   By: Van Clines M.D.   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  SEP 2018- PET scan Progression in neck bilaterally;  pelvis [increase in right pelvic LN]; stable mediastinal LN. Patient noted to have worsening lymphadenopathy on imaging/clinically; worsening anemia/thrombocytopenia.  # Plan start treatment with ibrutinib 280 mg/once a day [given end-stage renal disease/multiple comorbidities]; however given the infectious issues [C discussion below]; recommend starting ibrutinib only after the infectious issues are resolved.  # ? E.coli infection/bacteremia/sepsis-currently on IV antibiotics with nephrology. Discussed with Dr.Kolluru.   # Left eye keratitis- on topical anti-viral. As per ophthalmology.  # follow up in 3 weeks.   CC: Dr.Kolluru; Dr.Brasington, [place] -opthalm   Orders Placed This Encounter  Procedures  . CBC with Differential/Platelet    Standing Status:   Future    Standing Expiration Date:   12/07/2017  . Lactate dehydrogenase    Standing Status:   Future    Standing Expiration Date:   12/07/2017  . IgG, IgA, IgM    Standing Status:   Future    Standing Expiration Date:   12/07/2017       Cammie Sickle, MD 12/17/2016 7:52 PM

## 2016-12-12 ENCOUNTER — Encounter: Payer: Self-pay | Admitting: Podiatry

## 2016-12-12 ENCOUNTER — Ambulatory Visit (INDEPENDENT_AMBULATORY_CARE_PROVIDER_SITE_OTHER): Payer: Medicare HMO | Admitting: Podiatry

## 2016-12-12 DIAGNOSIS — E0842 Diabetes mellitus due to underlying condition with diabetic polyneuropathy: Secondary | ICD-10-CM | POA: Diagnosis not present

## 2016-12-12 DIAGNOSIS — B351 Tinea unguium: Secondary | ICD-10-CM

## 2016-12-12 DIAGNOSIS — L84 Corns and callosities: Secondary | ICD-10-CM | POA: Diagnosis not present

## 2016-12-12 DIAGNOSIS — M79676 Pain in unspecified toe(s): Secondary | ICD-10-CM | POA: Diagnosis not present

## 2016-12-15 NOTE — Progress Notes (Signed)
   SUBJECTIVE Patient with a history of diabetes mellitus presents to office today complaining of elongated, thickened nails. Pain while ambulating in shoes. Patient is unable to trim their own nails.   OBJECTIVE General Patient is awake, alert, and oriented x 3 and in no acute distress. Derm painful hyperkeratotic callus lesion also noted to the fifth MPJ left foot. Skin is dry and supple bilateral. Negative open lesions or macerations. Remaining integument unremarkable. Nails are tender, long, thickened and dystrophic with subungual debris, consistent with onychomycosis, 1-5 bilateral. No signs of infection noted. Vasc  DP and PT pedal pulses palpable bilaterally. Temperature gradient within normal limits.  Neuro Epicritic and protective threshold sensation diminished bilaterally.  Musculoskeletal Exam No symptomatic pedal deformities noted bilateral. Muscular strength within normal limits.  ASSESSMENT 1. Diabetes Mellitus w/ peripheral neuropathy 2. Onychomycosis of nail due to dermatophyte bilateral 3. Pain in foot bilateral 4. 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 Patient evaluated today. 2. Instructed to maintain good pedal hygiene and foot care. Stressed importance of controlling blood sugar.  3. Mechanical debridement of nails 1-5 bilaterally performed using a nail nipper. Filed with dremel without incident.  4. Excisional debridement of the callus lesion was performed using a chisel blade without incident. 5. Diabetic insole modified to offload 5th MPJ of left foot. 6. Return to clinic in 3 mos.     Edrick Kins, DPM [facility]  Dr. Edrick Kins, [address]                                        [place],  [address]                Office [phone]  Fax [phone]

## 2016-12-26 ENCOUNTER — Encounter (INDEPENDENT_AMBULATORY_CARE_PROVIDER_SITE_OTHER): Payer: Self-pay | Admitting: Vascular Surgery

## 2016-12-26 ENCOUNTER — Ambulatory Visit (INDEPENDENT_AMBULATORY_CARE_PROVIDER_SITE_OTHER): Payer: Medicare HMO

## 2016-12-26 ENCOUNTER — Ambulatory Visit (INDEPENDENT_AMBULATORY_CARE_PROVIDER_SITE_OTHER): Payer: Medicare HMO | Admitting: Vascular Surgery

## 2016-12-26 VITALS — BP 140/66 | HR 56 | Resp 16 | Wt 208.0 lb

## 2016-12-26 DIAGNOSIS — I10 Essential (primary) hypertension: Secondary | ICD-10-CM

## 2016-12-26 DIAGNOSIS — E1129 Type 2 diabetes mellitus with other diabetic kidney complication: Secondary | ICD-10-CM | POA: Diagnosis not present

## 2016-12-26 DIAGNOSIS — Z992 Dependence on renal dialysis: Secondary | ICD-10-CM

## 2016-12-26 DIAGNOSIS — N186 End stage renal disease: Secondary | ICD-10-CM

## 2016-12-26 DIAGNOSIS — E785 Hyperlipidemia, unspecified: Secondary | ICD-10-CM | POA: Diagnosis not present

## 2016-12-26 NOTE — Assessment & Plan Note (Signed)
blood glucose control important in reducing the progression of atherosclerotic disease. Also, involved in wound healing. On appropriate medications.  

## 2016-12-26 NOTE — Progress Notes (Signed)
MRN : [id number]  Mark [name]. is a 71 y.o. (10-05-1945) male who presents with chief complaint of  Chief Complaint  Patient presents with  . Follow-up    64mh HDA  .  History of Present Illness: Patient returns today in follow up of his dialysis access.  He has some prolonged bleeding but otherwise the access is working well.   His duplex today demonstrates a widely patent left radiocephalic AV fistula some aneurysmal degeneration and a moderate-sized branch in the mid forearm  Current Outpatient Prescriptions  Medication Sig Dispense Refill  . amLODipine (NORVASC) 10 MG tablet Take 10 mg by mouth daily.     .Marland Kitchenaspirin 325 MG tablet Take 325 mg by mouth daily.    . calcium acetate (PHOSLO) 667 MG capsule Take 1,334-2,001 mg by mouth See admin instructions. 3 caps with meals and 2 caps with snacks    . CEFTAZIDIME IV Inject into the vein. Three times a week x 2 weeks at dialysis    . Difluprednate (DUREZOL) 0.05 % EMUL Place 1 drop into the left eye 4 (four) times daily.    .Marland KitchenENSURE PLUS (ENSURE PLUS) LIQD Take by mouth 3 (three) times a week. M,W.F    . Epoetin Alfa (EPOGEN IJ) Inject 4,800 Units as directed every 21 ( twenty-one) days.    . fluticasone (FLONASE) 50 MCG/ACT nasal spray Place 1 spray into both nostrils daily as needed for allergies.     . furosemide (LASIX) 80 MG tablet Take 80-160 mg by mouth See admin instructions. 2 tablets in am 1 tablet in PM    . gabapentin (NEURONTIN) 100 MG capsule Take 1 capsule (100 mg total) by mouth 3 (three) times daily. (Patient taking differently: Take 200-300 mg by mouth 2 (two) times daily. ) 90 capsule 0  . glimepiride (AMARYL) 4 MG tablet Take 8 mg by mouth at bedtime.     . Ibrutinib 280 MG TABS Take 1 capsule by mouth daily. 30 tablet 4  . LEVEMIR FLEXTOUCH 100 UNIT/ML Pen Inject 25-30 Units into the skin daily at 10 pm. Depending on what he eats    . methylphenidate (RITALIN) 5 MG tablet Take 1 tablet by mouth daily.    .  metoCLOPramide (REGLAN) 5 MG tablet Take 5 mg by mouth 2 (two) times daily. 1 TAB M,W,F 1TAB BID T,T,S,S    . metoprolol tartrate (LOPRESSOR) 25 MG tablet Take 25 mg by mouth See admin instructions. Pt takes Tues morning, Friday Night, and Saturday morning and evening    . multivitamin (RENA-VIT) TABS tablet Take 1 tablet by mouth at bedtime.     .Marland Kitchenneomycin-polymyxin b-dexamethasone (MAXITROL) 3.5-10000-0.1 OINT Place 1 application into the left eye 2 (two) times daily.     . pravastatin (PRAVACHOL) 20 MG tablet Take 20 mg by mouth daily.    . prednisoLONE acetate (PRED FORTE) 1 % ophthalmic suspension Place 1 drop into the left eye 4 (four) times daily.    . silver sulfADIAZINE (SILVADENE) 1 % cream Apply 1 application topically daily. 30 g 2  . ZIRGAN 0.15 % GEL Place 1 application into the left eye 3 (three) times daily as needed.     No current facility-administered medications for this visit.     Past Medical History:  Diagnosis Date  . Anemia   . Arthritis   . Bladder cancer ([place]   . Chronic kidney disease   . Diabetes mellitus without complication ([place]   .  Dialysis patient [facility])    M,W,F  . Dyspnea    DOE  . GERD (gastroesophageal reflux disease)   . HOH (hard of hearing)   . Hypertension   . IBS (irritable bowel syndrome)   . Lymphoma ([place]) 09/27/2014  . Neuropathy    RIGHT LEG  . Stroke [facility])    TIA    Past Surgical History:  Procedure Laterality Date  . BACK SURGERY  2007  . CATARACT EXTRACTION W/PHACO Left 03/23/2016   Procedure: CATARACT EXTRACTION PHACO AND INTRAOCULAR LENS PLACEMENT (IOC);  Surgeon: Leandrew Koyanagi, MD;  Location: [facility] ORS;  Service: Ophthalmology;  Laterality: Left;  [place] 52.6 AP% 14.7 CDE 7.72 Fluid pack lot # [id number]  . CATARACT EXTRACTION W/PHACO Right 05/30/2016   Procedure: CATARACT EXTRACTION PHACO AND INTRAOCULAR LENS PLACEMENT ([place]);  Surgeon: Leandrew Koyanagi, MD;  Location: [facility] ORS;  Service: Ophthalmology;  Laterality:  Right;  [place] 01:08 AP% 13.9 CDE 9.53  note: could not get IV in patient, so procedure done without anesthesia personell present, ok per Dr Wallace Going fluid pack lo t# [id number]  . CIRCUMCISION    . PERIPHERAL VASCULAR CATHETERIZATION Left 08/19/2015   Procedure: A/V Shuntogram/Fistulagram;  Surgeon: Algernon Huxley, MD;  Location: [place] [facility];  Service: Cardiovascular;  Laterality: Left;  . PERIPHERAL VASCULAR CATHETERIZATION N/A 08/19/2015   Procedure: A/V Shunt Intervention;  Surgeon: Algernon Huxley, MD;  Location: [facility];  Service: Cardiovascular;  Laterality: N/A;    Social History       Social History  Substance Use Topics  . Smoking status: Former Smoker    Types: Cigarettes    Quit date: 05/19/2013  . Smokeless tobacco: Never Used     Comment: quit  . Alcohol use No  No IVDU  Family History      Family History  Problem Relation Age of Onset  . Kidney cancer Neg Hx   . Prostate cancer Neg Hx   . Kidney failure Neg Hx   . Bladder Cancer Neg Hx          Allergies  Allergen Reactions  . Ibuprofen     DUE TO DIALYSIS  . Multivitamin [Centrum]     DUE TO DIALYSIS  . Daypro [Oxaprozin] Swelling and Rash    Other reaction(s): Other (See Comments)     REVIEW OF SYSTEMS (Negative unless checked)  Constitutional: _0 Weight loss  _1 Fever  _2 Chills Cardiac: _3 Chest pain   _4 Chest pressure   _5 Palpitations   _6 Shortness of breath when laying flat   _7 Shortness of breath at rest   _8 Shortness of breath with exertion. Vascular:  _9 Pain in legs with walking   _10 Pain in legs at rest   _11 Pain in legs when laying flat   _12 Claudication   _13 Pain in feet when walking  _14 Pain in feet at rest  _15 Pain in feet when laying flat   _16 History of DVT   _17 Phlebitis   _18 Swelling in legs   _19 Varicose veins   _20 Non-healing ulcers Pulmonary:   _21 Uses home oxygen   _22 Productive cough   _23 Hemoptysis   _24 Wheeze  _25 COPD   _26 Asthma Neurologic:  _27 Dizziness   _28 Blackouts   _29 Seizures   _30 History of stroke   _31 History of TIA  _32 Aphasia   _33 Temporary blindness   _34 Dysphagia   _35 Weakness or numbness in arms   _36 Weakness or numbness in legs Musculoskeletal:  _37 Arthritis   _38 Joint swelling   _39 Joint pain   _40 Low back pain Hematologic:  _41 Easy bruising  _42 Easy bleeding   _43   Hypercoagulable state   _0 Anemic   Gastrointestinal:  _1 Blood in stool   _2 Vomiting blood  _3 Gastroesophageal reflux/heartburn   _4 Abdominal pain Genitourinary:  _5 Chronic kidney disease   _6 Difficult urination  _7 Frequent urination  _8 Burning with urination   _9 Hematuria Skin:  _10 Rashes   _11 Ulcers   _12 Wounds Psychological:  _13 History of anxiety   _14  History of major depression.   Physical Examination  BP 140/66   Pulse (!) 56   Resp 16   Wt 208 lb (94.3 kg)   BMI 29.84 kg/m  Gen:  WD/WN, NAD Head: [place]/AT, No temporalis wasting. Ear/Nose/Throat: Hearing grossly intact, nares w/o erythema or drainage, trachea midline Eyes: Conjunctiva clear. Sclera non-icteric Neck: Supple.  No JVD.  Pulmonary:  Good air movement, no use of accessory muscles.  Cardiac: RRR, normal S1, S2 Vascular: good thrill in left forearm AVF Vessel Right Left  Radial Palpable Palpable                                    Musculoskeletal: M/S 5/5 throughout.  No deformity or atrophy. Uses a wheelchair Neurologic: Sensation grossly intact in extremities.  Symmetrical.  Speech is fluent.  Psychiatric: Judgment and insight are fair Dermatologic: No rashes or ulcers noted.  No cellulitis or open wounds.       Labs Recent Results (from the past 2160 hour(s))  Lactic acid, plasma     Status: Abnormal   Collection Time: 10/20/16 11:32 AM  Result Value Ref Range   Lactic Acid, Venous 2.2 (HH) 0.5 - 1.9 mmol/L    Comment: CRITICAL RESULT CALLED TO, READ BACK BY AND VERIFIED WITH  BILL SMITH AT 1253 10/20/16 SDR   Comprehensive metabolic panel     Status: Abnormal   Collection Time: 10/20/16  11:32 AM  Result Value Ref Range   Sodium 136 135 - 145 mmol/L   Potassium 4.7 3.5 - 5.1 mmol/L   Chloride 98 (L) 101 - 111 mmol/L   CO2 26 22 - 32 mmol/L   Glucose, Bld 260 (H) 65 - 99 mg/dL   BUN 51 (H) 6 - 20 mg/dL   Creatinine, Ser 8.18 (H) 0.61 - 1.24 mg/dL   Calcium 8.6 (L) 8.9 - 10.3 mg/dL   Total Protein 6.6 6.5 - 8.1 g/dL   Albumin 3.9 3.5 - 5.0 g/dL   AST 22 15 - 41 U/L   ALT 16 (L) 17 - 63 U/L   Alkaline Phosphatase 51 38 - 126 U/L   Total Bilirubin 0.6 0.3 - 1.2 mg/dL   GFR calc non Af Amer 6 (L) >60 mL/min   GFR calc Af Amer 7 (L) >60 mL/min    Comment: (NOTE) The eGFR has been calculated using the CKD EPI equation. This calculation has not been validated in all clinical situations. eGFR's persistently <60 mL/min signify possible Chronic Kidney Disease.    Anion gap 12 5 - 15  Troponin I     Status: Abnormal   Collection Time: 10/20/16 11:32 AM  Result Value Ref Range   Troponin I 0.05 (HH) <0.03 ng/mL    Comment: CRITICAL RESULT CALLED TO, READ BACK BY AND VERIFIED WITH  BILL SMITH AT 1253 10/20/16 SDR   CBC WITH DIFFERENTIAL     Status: Abnormal   Collection Time: 10/20/16 11:32 AM  Result Value Ref Range   WBC 7.4 3.8 - 10.6 K/uL   RBC 3.33 (L) 4.40 - 5.90 MIL/uL  Hemoglobin 11.3 (L) 13.0 - 18.0 g/dL   HCT 32.5 (L) 40.0 - 52.0 %   MCV 97.5 80.0 - 100.0 fL   MCH 33.9 26.0 - 34.0 pg   MCHC 34.7 32.0 - 36.0 g/dL   RDW 14.3 11.5 - 14.5 %   Platelets 86 (L) 150 - 440 K/uL   Neutrophils Relative % 79 %   Neutro Abs 5.8 1.4 - 6.5 K/uL   Lymphocytes Relative 19 %   Lymphs Abs 1.4 1.0 - 3.6 K/uL   Monocytes Relative 2 %   Monocytes Absolute 0.2 0.2 - 1.0 K/uL   Eosinophils Relative 0 %   Eosinophils Absolute 0.0 0 - 0.7 K/uL   Basophils Relative 0 %   Basophils Absolute 0.0 0 - 0.1 K/uL  Blood gas, venous ([facility], [facility], [facility])     Status: Abnormal   Collection Time: 10/20/16 11:32 AM  Result Value Ref Range   pH, Ven 7.44 (H) 7.250 - 7.430   pCO2, Ven 46 44.0  - 60.0 mmHg   pO2, Ven 35.0 32.0 - 45.0 mmHg   Bicarbonate 31.2 (H) 20.0 - 28.0 mmol/L   Acid-Base Excess 6.2 (H) 0.0 - 2.0 mmol/L   O2 Saturation 70.0 %   Patient temperature 37.0    Collection site VEIN    Sample type VENOUS   Urinalysis, Complete w Microscopic     Status: Abnormal   Collection Time: 10/20/16 11:32 AM  Result Value Ref Range   Color, Urine YELLOW (A) YELLOW   APPearance HAZY (A) CLEAR   Specific Gravity, Urine 1.012 1.005 - 1.030   pH 5.0 5.0 - 8.0   Glucose, UA NEGATIVE NEGATIVE mg/dL   Hgb urine dipstick MODERATE (A) NEGATIVE   Bilirubin Urine NEGATIVE NEGATIVE   Ketones, ur NEGATIVE NEGATIVE mg/dL   Protein, ur 100 (A) NEGATIVE mg/dL   Nitrite NEGATIVE NEGATIVE   Leukocytes, UA LARGE (A) NEGATIVE   RBC / HPF 6-30 0 - 5 RBC/hpf   WBC, UA TOO NUMEROUS TO COUNT 0 - 5 WBC/hpf   Bacteria, UA MANY (A) NONE SEEN   Squamous Epithelial / LPF NONE SEEN NONE SEEN   WBC Clumps PRESENT    Mucus PRESENT   Blood Culture (routine x 2)     Status: None   Collection Time: 10/20/16 11:39 AM  Result Value Ref Range   Specimen Description BLOOD BLOOD RIGHT HAND    Special Requests      BOTTLES DRAWN AEROBIC AND ANAEROBIC Blood Culture adequate volume   Culture NO GROWTH 5 DAYS    Report Status 10/25/2016 FINAL   Blood Culture (routine x 2)     Status: None   Collection Time: 10/20/16 12:06 PM  Result Value Ref Range   Specimen Description BLOOD BLOOD LEFT WRIST    Special Requests      BOTTLES DRAWN AEROBIC AND ANAEROBIC Blood Culture adequate volume   Culture NO GROWTH 5 DAYS    Report Status 10/25/2016 FINAL   Urine culture     Status: None   Collection Time: 10/20/16  1:10 PM  Result Value Ref Range   Specimen Description URINE, RANDOM    Special Requests NONE    Culture      NO GROWTH Performed at [facility], [address], [place], [place] [address]    Report Status 10/21/2016 FINAL   Lactic acid, plasma     Status: None   Collection Time:  10/20/16  2:40 PM  Result Value  Ref Range   Lactic Acid, Venous 1.3 0.5 - 1.9 mmol/L  Glucose, capillary     Status: Abnormal   Collection Time: 10/20/16  4:48 PM  Result Value Ref Range   Glucose-Capillary 123 (H) 65 - 99 mg/dL  Troponin I     Status: Abnormal   Collection Time: 10/20/16  4:57 PM  Result Value Ref Range   Troponin I 0.05 (HH) <0.03 ng/mL    Comment: CRITICAL VALUE NOTED. VALUE IS CONSISTENT WITH PREVIOUSLY REPORTED/CALLED VALUE [facility]  MRSA PCR Screening     Status: None   Collection Time: 10/20/16  6:58 PM  Result Value Ref Range   MRSA by PCR NEGATIVE NEGATIVE    Comment:        The GeneXpert MRSA Assay (FDA approved for NASAL specimens only), is one component of a comprehensive MRSA colonization surveillance program. It is not intended to diagnose MRSA infection nor to guide or monitor treatment for MRSA infections.   Glucose, capillary     Status: Abnormal   Collection Time: 10/20/16  9:03 PM  Result Value Ref Range   Glucose-Capillary 232 (H) 65 - 99 mg/dL   Comment 1 Notify RN   Troponin I     Status: Abnormal   Collection Time: 10/20/16 10:34 PM  Result Value Ref Range   Troponin I 0.04 (HH) <0.03 ng/mL    Comment: CRITICAL VALUE NOTED. VALUE IS CONSISTENT WITH PREVIOUSLY REPORTED/CALLED VALUE RWW   Basic metabolic panel     Status: Abnormal   Collection Time: 10/21/16  4:00 AM  Result Value Ref Range   Sodium 136 135 - 145 mmol/L   Potassium 4.2 3.5 - 5.1 mmol/L   Chloride 94 (L) 101 - 111 mmol/L   CO2 29 22 - 32 mmol/L   Glucose, Bld 144 (H) 65 - 99 mg/dL   BUN 60 (H) 6 - 20 mg/dL   Creatinine, Ser 8.96 (H) 0.61 - 1.24 mg/dL   Calcium 8.5 (L) 8.9 - 10.3 mg/dL   GFR calc non Af Amer 5 (L) >60 mL/min   GFR calc Af Amer 6 (L) >60 mL/min    Comment: (NOTE) The eGFR has been calculated using the CKD EPI equation. This calculation has not been validated in all clinical situations. eGFR's persistently <60 mL/min signify possible Chronic  Kidney Disease.    Anion gap 13 5 - 15  CBC     Status: Abnormal   Collection Time: 10/21/16  4:00 AM  Result Value Ref Range   WBC 6.7 3.8 - 10.6 K/uL   RBC 3.15 (L) 4.40 - 5.90 MIL/uL   Hemoglobin 10.7 (L) 13.0 - 18.0 g/dL   HCT 30.5 (L) 40.0 - 52.0 %   MCV 96.8 80.0 - 100.0 fL   MCH 34.1 (H) 26.0 - 34.0 pg   MCHC 35.2 32.0 - 36.0 g/dL   RDW 14.2 11.5 - 14.5 %   Platelets 80 (L) 150 - 440 K/uL  Hemoglobin A1c     Status: Abnormal   Collection Time: 10/21/16  4:00 AM  Result Value Ref Range   Hgb A1c MFr Bld 7.0 (H) 4.8 - 5.6 %    Comment: (NOTE)         Pre-diabetes: 5.7 - 6.4         Diabetes: >6.4         Glycemic control for adults with diabetes: <7.0    Mean Plasma Glucose 154 mg/dL    Comment: (NOTE) A duplicate report has  been generated due to demographic updates. Performed At: [facility] [place], [place] [id number] Lindon Romp MD KQ:[id number]   Glucose, capillary     Status: None   Collection Time: 10/21/16  7:34 AM  Result Value Ref Range   Glucose-Capillary 89 65 - 99 mg/dL   Comment 1 Notify RN   Glucose, capillary     Status: Abnormal   Collection Time: 10/21/16 11:41 AM  Result Value Ref Range   Glucose-Capillary 110 (H) 65 - 99 mg/dL  Phosphorus     Status: None   Collection Time: 10/21/16  4:24 PM  Result Value Ref Range   Phosphorus 4.3 2.5 - 4.6 mg/dL  Parathyroid hormone, intact (no Ca)     Status: Abnormal   Collection Time: 10/21/16  4:24 PM  Result Value Ref Range   PTH 93 (H) 15 - 65 pg/mL    Comment: (NOTE) A duplicate report has been generated due to demographic updates. Performed At: [facility] [place], [place] [id number] Lindon Romp MD [id number]   Glucose, capillary     Status: Abnormal   Collection Time: 10/21/16  9:12 PM  Result Value Ref Range   Glucose-Capillary 147 (H) 65 - 99 mg/dL  Glucose, capillary     Status: Abnormal   Collection Time: 10/22/16  3:06 AM   Result Value Ref Range   Glucose-Capillary 125 (H) 65 - 99 mg/dL  Glucose, capillary     Status: None   Collection Time: 10/22/16  7:26 AM  Result Value Ref Range   Glucose-Capillary 88 65 - 99 mg/dL   Comment 1 Notify RN   Glucose, capillary     Status: Abnormal   Collection Time: 10/22/16 11:35 AM  Result Value Ref Range   Glucose-Capillary 165 (H) 65 - 99 mg/dL   Comment 1 Notify RN   CBC with Differential     Status: Abnormal   Collection Time: 11/16/16 11:22 AM  Result Value Ref Range   WBC 6.4 3.8 - 10.6 K/uL   RBC 3.16 (L) 4.40 - 5.90 MIL/uL   Hemoglobin 10.6 (L) 13.0 - 18.0 g/dL   HCT 30.3 (L) 40.0 - 52.0 %   MCV 95.8 80.0 - 100.0 fL   MCH 33.7 26.0 - 34.0 pg   MCHC 35.1 32.0 - 36.0 g/dL   RDW 14.7 (H) 11.5 - 14.5 %   Platelets 101 (L) 150 - 440 K/uL   Neutrophils Relative % 61 %   Neutro Abs 3.9 1.4 - 6.5 K/uL   Lymphocytes Relative 33 %   Lymphs Abs 2.1 1.0 - 3.6 K/uL   Monocytes Relative 2 %   Monocytes Absolute 0.1 (L) 0.2 - 1.0 K/uL   Eosinophils Relative 3 %   Eosinophils Absolute 0.2 0 - 0.7 K/uL   Basophils Relative 1 %   Basophils Absolute 0.0 0 - 0.1 K/uL  Basic metabolic panel     Status: Abnormal   Collection Time: 11/16/16 11:22 AM  Result Value Ref Range   Sodium 134 (L) 135 - 145 mmol/L   Potassium 4.1 3.5 - 5.1 mmol/L   Chloride 95 (L) 101 - 111 mmol/L   CO2 29 22 - 32 mmol/L   Glucose, Bld 121 (H) 65 - 99 mg/dL   BUN 27 (H) 6 - 20 mg/dL   Creatinine, Ser 5.52 (H) 0.61 - 1.24 mg/dL   Calcium 8.3 (L) 8.9 - 10.3 mg/dL   GFR calc non Af Amer 9 (  L) >60 mL/min   GFR calc Af Amer 11 (L) >60 mL/min    Comment: (NOTE) The eGFR has been calculated using the CKD EPI equation. This calculation has not been validated in all clinical situations. eGFR's persistently <60 mL/min signify possible Chronic Kidney Disease.    Anion gap 10 5 - 15  Lactate dehydrogenase     Status: None   Collection Time: 11/16/16 11:22 AM  Result Value Ref Range   LDH 158  98 - 192 U/L  Iron and TIBC     Status: Abnormal   Collection Time: 11/16/16 11:23 AM  Result Value Ref Range   Iron 67 45 - 182 ug/dL   TIBC 225 (L) 250 - 450 ug/dL   Saturation Ratios 30 17.9 - 39.5 %   UIBC 158 ug/dL  Ferritin     Status: Abnormal   Collection Time: 11/16/16 11:23 AM  Result Value Ref Range   Ferritin 601 (H) 24 - 336 ng/mL  Glucose, capillary     Status: Abnormal   Collection Time: 11/30/16 10:20 AM  Result Value Ref Range   Glucose-Capillary 107 (H) 65 - 99 mg/dL    Radiology Nm Pet Image Restag (ps) Skull Base To Thigh  Result Date: 11/30/2016 CLINICAL DATA:  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 EXAM: NUCLEAR MEDICINE PET SKULL BASE TO THIGH TECHNIQUE: 12.6 mCi F-18 FDG was injected intravenously. Full-ring PET imaging was performed from the skull base to thigh after the radiotracer. CT data was obtained and used for attenuation correction and anatomic localization. FASTING BLOOD GLUCOSE:  Value: 107 mg/dl COMPARISON:  Multiple exams, including 05/16/2016 FINDINGS: NECK Again observed are enlarged and hypermetabolic level IIa, IIb, level V, level III, left level IV, and left deep parotid lymph nodes, with nodal disease and hypermetabolic activity somewhat greater on the left than the right. An index left level IIb lymph node on image 42/3 measures 2.3 cm in length (formerly 1.5 cm) and with maximum SUV 4.0 (formerly 4.5). A left level IIa lymph node on image 50/ 3 measures 2.6 cm in short axis (formerly 2.1 cm) with maximum SUV 4.9 (formerly the 4.2). CHEST Pathologic and hypermetabolic bilateral axillary, left greater than right subpectoral, paratracheal, prevascular, AP window, subcarinal, and bilateral hilar lymph nodes are present. An index right paratracheal node measures 2.6 cm in short axis (previously 1.8 cm by my measurement) with maximum SUV 4.4 (formerly 4.4). The general trend is of nodal enlargement but  with similar metabolic activity compared to prior. Coronary, aortic arch, and branch vessel atherosclerotic vascular disease. Mild cardiomegaly. New 6 by 4 mm left upper lobe pulmonary nodule on image 79/3 without perceptible hypermetabolic activity. Bandlike subpleural density in the right lower lobe is again observed with maximum SUV 3.4, formerly 4.8. Mild atelectasis along the hemidiaphragms. Background mediastinal blood pool activity 3.1. ABDOMEN/PELVIS Pathologically enlarged porta hepatis, peripancreatic, mesenteric, periaortic, bilateral common iliac, and right external iliac adenopathy is again noted. An index right common iliac node measures 2.8 cm in short axis on image 217/3 (formerly 1.5 cm) with maximum SUV 4.4 (formerly 4.2). Other lymph nodes are similarly enlarged compared to prior but of similar metabolic activity. Background liver activity level SUV 3.7. Bilateral perirenal stranding. Right renal cysts. Suspected vascular calcifications in the left renal hilum. Aortoiliac atherosclerotic vascular disease. There is subcutaneous stranding along the right lower quadrant similar to the prior exam, maximum SUV 3.0, formerly 4.2, significance uncertain. SKELETON No focal hypermetabolic activity to suggest  skeletal metastasis. IMPRESSION: 1. Further enlargement of pathologic adenopathy in the neck, chest, abdomen, and pelvis, but with similar [place] 4 level of metabolic activity compared to the prior exam, compatible with active lymphoma. No bony involvement or splenomegaly identified. 2. 6 by 4 mm left upper lobe pulmonary nodule appears to be new and does not have hypermetabolic activity currently, surveillance suggested. 3. Several findings of indeterminate significance include some bandlike probable atelectasis or scarring medially in the right lower lobe with faintly accentuated activity, as well as some focal subcutaneous activity in stranding in the right lower quadrant. 4. Other imaging  findings of potential clinical significance: Aortic Atherosclerosis (ICD10-I70.0). Right renal cysts. Coronary atherosclerosis. Electronically Signed   By: Van Clines M.D.   On: 11/30/2016 14:42      Assessment/Plan Diabetes mellitus ([place]) blood glucose control important in reducing the progression of atherosclerotic disease. Also, involved in wound healing. On appropriate medications.   HLD (hyperlipidemia) lipid control important in reducing the progression of atherosclerotic disease. Continue statin therapy   Essential (primary) hypertension blood pressure control important in reducing the progression of atherosclerotic disease. On appropriate oral medications.   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  This fistula has required multiple previous interventions and remains moderately aneurysmal. There is no skin threat and no need for current interventions. Continue to use this access but rotating the sites of access as much as possible. Return to clinic in 6 months with duplex or sooner if problems develop in the interim.    Leotis Pain, MD  12/26/2016 4:01 PM    This note was created with Dragon medical transcription system.  Any errors from dictation are purely unintentional

## 2016-12-26 NOTE — Patient Instructions (Signed)
Vascular Access for Hemodialysis A vascular access is a connection between two blood vessels that allows blood to be easily removed from the body and returned to the body during hemodialysis. Hemodialysis is a procedure in which a machine outside of the body filters the blood. There are three types of vascular accesses:  Arteriovenous fistula. This is a connection between an artery and a vein (usually in the arm) that is made by sewing them together. Blood in the artery flows directly into the vein, causing it to get larger over time. This makes it easier for the vein to be used for hemodialysis. An arteriovenous fistula takes 1-6 months to develop after surgery.  Arteriovenous graft. This is a connection between an artery and a vein in the arm that is made with a tube. An arteriovenous graft can be used within 2-3 weeks of surgery.  Venous catheter. This is a thin, flexible tube that is placed in a large vein (usually in the neck, chest, or groin). A venous catheter for hemodialysis contains two tubes that come out of the skin. A venous catheter can be used right away. It is usually used as a temporary access if you need hemodialysis before a fistula or graft has developed. It may also be used as a permanent access if a fistula or graft cannot be created.  Which type of access is best for me? The type of access that is best for you depends on the size and strength of your veins. A fistula is usually the preferred type of access. It can last several years and is less likely than the other types of accesses to become infected or to cause blood clots within a blood vessel (thrombosis). However, a fistula is not an option for everyone. If your veins are not the right size, a graft may be used instead. Grafts require you to have strong veins. If your veins are not strong enough for a graft, a catheter may be used. Catheters are more likely than fistulas and grafts to become infected or to have  thrombosis. Sometimes, only one type of access is an option. Your health care provider will help you determine which type of access is best for you. How is a vascular access used? The way the access is used depends on the type of access:  If the access is a fistula or graft, two needles are inserted through the skin into the access before each hemodialysis session. Blood leaves the body through one of the needles and travels through a tube to the hemodialysis machine (dialyzer). It then flows through another tube and returns to the body through the second needle.  If the access is a catheter, one tube is connected directly to the tube that leads to the dialyzer and the other is connected to a tube that leads away from the dialyzer. Blood leaves the body through one tube and returns to the body through the other.  What kind of problems can occur with vascular accesses?  Blood clots within a blood vessel (thrombosis). Thrombosis can lead to a narrowing of a blood vessel or tube (stenosis). If thrombosis occurs frequently, another access site may be created as a backup.  Infection. These problems are most likely to occur with a venous catheter and least likely to occur with an arteriovenous fistula. How do I care for my vascular access? Wear a medical alert bracelet. This tells health care providers that you are a dialysis patient in the case of an emergency and   allows them to care for your veins appropriately. If you have a graft or fistula:  A "bruit" is a noise that is heard with a stethoscope and a "thrill" is a vibration felt over the graft or fistula. The presence of the bruit and thrill indicates that the access is working. You will be taught to feel for the thrill each day. If this is not felt, the access may be clotted. Call your health care provider.  You may use the arm where your vascular access is located freely after the site heals. Keep the following in mind: ? Avoid pressure on the  arm. ? Avoid lifting heavy objects with the arm. ? Avoid sleeping on the arm. ? Avoid wearing tight-sleeved shirts or jewelry around the graft or fistula.  Do not allow blood pressure monitoring or needle punctures on the side where the graft or fistula is located.  With permission from your health care provider, you may do exercises to help with blood flow through a fistula. These exercises involve squeezing a rubber ball or other soft objects as instructed.  Contact a health care provider if:  Chills develop.  You have an oral temperature above 102 F (38.9 C).  Swelling around the graft or fistula gets worse.  New pain develops.  Pus or other fluid (drainage) is seen at the vascular access site.  Skin redness or red streaking is seen on the skin around, above, or below the vascular access. Get help right away if:  Pain, numbness, or an unusual pale skin color develops in the hand on the side of your fistula.  Dizziness or weakness develops that you have not had before.  The vascular access has bleeding that cannot be easily controlled. This information is not intended to replace advice given to you by your health care provider. Make sure you discuss any questions you have with your health care provider. Document Released: 06/10/2002 Document Revised: 08/26/2015 Document Reviewed: 08/06/2012 Elsevier Interactive Patient Education  2017 [employer]  

## 2016-12-28 ENCOUNTER — Inpatient Hospital Stay: Payer: Medicare HMO

## 2016-12-28 ENCOUNTER — Inpatient Hospital Stay (HOSPITAL_BASED_OUTPATIENT_CLINIC_OR_DEPARTMENT_OTHER): Payer: Medicare HMO | Admitting: Internal Medicine

## 2016-12-28 VITALS — BP 154/73 | HR 76 | Temp 96.2°F | Resp 20 | Ht 70.0 in | Wt 208.0 lb

## 2016-12-28 DIAGNOSIS — N186 End stage renal disease: Secondary | ICD-10-CM | POA: Diagnosis not present

## 2016-12-28 DIAGNOSIS — Z87891 Personal history of nicotine dependence: Secondary | ICD-10-CM | POA: Diagnosis not present

## 2016-12-28 DIAGNOSIS — B9620 Unspecified Escherichia coli [E. coli] as the cause of diseases classified elsewhere: Secondary | ICD-10-CM

## 2016-12-28 DIAGNOSIS — D696 Thrombocytopenia, unspecified: Secondary | ICD-10-CM

## 2016-12-28 DIAGNOSIS — I120 Hypertensive chronic kidney disease with stage 5 chronic kidney disease or end stage renal disease: Secondary | ICD-10-CM

## 2016-12-28 DIAGNOSIS — Z79899 Other long term (current) drug therapy: Secondary | ICD-10-CM | POA: Diagnosis not present

## 2016-12-28 DIAGNOSIS — C8303 Small cell B-cell lymphoma, intra-abdominal lymph nodes: Secondary | ICD-10-CM

## 2016-12-28 DIAGNOSIS — Z992 Dependence on renal dialysis: Secondary | ICD-10-CM | POA: Diagnosis not present

## 2016-12-28 DIAGNOSIS — D649 Anemia, unspecified: Secondary | ICD-10-CM

## 2016-12-28 LAB — CBC WITH DIFFERENTIAL/PLATELET
BASOS PCT: 1 %
Basophils Absolute: 0 10*3/uL (ref 0–0.1)
EOS ABS: 0.2 10*3/uL (ref 0–0.7)
EOS PCT: 5 %
HCT: 27.5 % — ABNORMAL LOW (ref 40.0–52.0)
Hemoglobin: 9.8 g/dL — ABNORMAL LOW (ref 13.0–18.0)
LYMPHS ABS: 1.6 10*3/uL (ref 1.0–3.6)
Lymphocytes Relative: 40 %
MCH: 34.2 pg — AB (ref 26.0–34.0)
MCHC: 35.8 g/dL (ref 32.0–36.0)
MCV: 95.5 fL (ref 80.0–100.0)
MONOS PCT: 2 %
Monocytes Absolute: 0.1 10*3/uL — ABNORMAL LOW (ref 0.2–1.0)
NEUTROS PCT: 52 %
Neutro Abs: 2.2 10*3/uL (ref 1.4–6.5)
PLATELETS: 120 10*3/uL — AB (ref 150–440)
RBC: 2.88 MIL/uL — ABNORMAL LOW (ref 4.40–5.90)
RDW: 15.3 % — ABNORMAL HIGH (ref 11.5–14.5)
WBC: 4.1 10*3/uL (ref 3.8–10.6)

## 2016-12-28 LAB — LACTATE DEHYDROGENASE: LDH: 159 U/L (ref 98–192)

## 2016-12-28 MED ORDER — ACYCLOVIR 400 MG PO TABS: ORAL_TABLET | ORAL | 3 refills | Status: DC

## 2016-12-28 NOTE — Progress Notes (Signed)
Pt reports decrease in appetite. He reports that his plt count was 130 at his last dialysis blood draw. He continues to have bleeding from his dialysis shunt, he was evlauated by Dr. Lucky Cowboy yesterday- who will continue to monitor this. No intervention at this time per patient. He reports that he has not started the imbruvica; although he has received the shipment. Pt wanted to clarify when to start the new medication. He also wanted Dr. 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 He states that he was evaluated by Dr. Wallace Going on 9/30 for his ongoing eye problems.

## 2016-12-28 NOTE — Progress Notes (Signed)
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 Bone marrow biopsy on 03/05/13 with mildly hypercellular marrow for age 71-50% with interstitial predominantly small B-lymphocytic infiltrate estimated about 20-30% of marrow cells, mild nonspecific dyserythropoiesis, storage iron present this. Flow cytometry showed 29% CD5+ clonal B-cell population which is CD45+, CD5+, CD19+, CD20+, CD22+, CD23+, CD38+, HLA-DR+, Slg kappa+, and is CD10-, CD11b-, FMC7-. 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 PET scan on 03/03/13 with extensive hypermetabolic lymphadenopathy.  (presented with progressive Thrombocytopenia with persistent Anemia. CBC on 01/16/13 showed hemoglobin 10.0, MCV 98, platelets 80, WBC 4890 with 40% neutrophils, 54% lymphocytes, 1.4% monocytes. On Epogen and Venofer at dialysis treatments. Further workup showed small M-spike of 0.2).   Got Treanda/Rituxan x2 cycles in Dec 2014/Jan 2015. Then on single agent Rituxan q 8 weeks (got #3 dose on 09/16/13). --------------------------------------------------------------- ----------------------  # DEC 2014- SMALL CELL LYMPHOMA/CLL- STAGE IV [BMBx- 03/2013- 20-30% B cell infiltrate; CD 5+/CD 20+]; FISH- Trisomy 12;DEC 2014-Treanda-Rituxan x2 [held sec to prolonged hospital/pneumoani/HD]; Rituxa q8W [last June 2015; Held sec to Superficial Bladder ca]; CT- JUNE 2017- STABLE Mediastinal LN/supraclav LN/pelvic LN; CT DEC 2017- mild progression noted ~1.5-2cm mediastinal/Ax LN. Cont surveillance  # SEP 2018- PROGRESSION on PET scan-  # SEP 27th 2018- Ibrutinib 280 mg/day [after HD];   # 7mm RLL lung nodule- F/u in 6 M [Hx of smoking quit 2015]  # ESRD on HD [Dr.Voora/Dr.Kolluru]; DM; Non-healing ulcers in feet/Hx R foot  drop- walker ambulation  #Noninvasive Bladder cancer/recent UTIs-currently resolved.     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  I reviewed the recent ophthalmology note regarding  left eye keratitis-Patient is currently on topical antiviral. Patient also status post antibiotics IV for Escherichia coli blood stream infection-as per nephrology. Discussed with Dr. Kolluru.  He currently denies any fevers. He however noticed to have increasing swelling of his left neck lymph nodes. Denies any pain.  Denies any bleeding episodes. He does complain of poor appetite.  Patient denies any unusual weight loss or night sweats. Denies any new lumps or bumps any where else. Denies any shortness of breath or cough.    REVIEW OF SYSTEMS:  A complete 10 point review of system is done which is negative except mentioned above/history of present illness.   PAST MEDICAL HISTORY :  Past Medical History:  Diagnosis Date  . Anemia   . Arthritis   . Bladder cancer (HCC)   . Chronic kidney disease   . Diabetes mellitus without complication (HCC)   . Dialysis patient (HCC)    M,W,F  . Dyspnea    DOE  . GERD (gastroesophageal reflux disease)   . HOH (hard of hearing)   . Hypertension   . IBS (irritable bowel syndrome)   . Lymphoma (HCC) 09/27/2014  . Neuropathy    RIGHT LEG  . Stroke (HCC)    TIA    PAST SURGICAL HISTORY :   Past Surgical History:  Procedure Laterality Date  . BACK SURGERY  2007  . CATARACT EXTRACTION W/PHACO Left 03/23/2016   Procedure: CATARACT EXTRACTION PHACO AND INTRAOCULAR LENS PLACEMENT (  IOC);  Surgeon: Chadwick Brasington, MD;  Location: [facility] ORS;  Service: Ophthalmology;  Laterality: Left;  US 52.6 AP% 14.7 CDE 7.72 Fluid pack lot # [id number]  . CATARACT EXTRACTION  W/PHACO Right 05/30/2016   Procedure: CATARACT EXTRACTION PHACO AND INTRAOCULAR LENS PLACEMENT (IOC);  Surgeon: Chadwick Brasington, MD;  Location: [facility] ORS;  Service: Ophthalmology;  Laterality: Right;  US 01:08 AP% 13.9 CDE 9.53  note: could not get IV in patient, so procedure done without anesthesia personell present, ok per Dr Brasington fluid pack lo t# [id number]  . CIRCUMCISION    . PERIPHERAL VASCULAR CATHETERIZATION Left 08/19/2015   Procedure: A/V Shuntogram/Fistulagram;  Surgeon: Jason S Dew, MD;  Location: [facility] INVASIVE CV LAB;  Service: Cardiovascular;  Laterality: Left;  . PERIPHERAL VASCULAR CATHETERIZATION N/A 08/19/2015   Procedure: A/V Shunt Intervention;  Surgeon: Jason S Dew, MD;  Location: [facility] INVASIVE CV LAB;  Service: Cardiovascular;  Laterality: N/A;    FAMILY HISTORY :   Family History  Problem Relation Age of Onset  . Cancer Mother   . Kidney cancer Neg Hx   . Prostate cancer Neg Hx   . Kidney failure Neg Hx   . Bladder Cancer Neg Hx     SOCIAL HISTORY:   Social History  Substance Use Topics  . Smoking status: Former Smoker    Types: Cigarettes    Quit date: 05/19/2013  . Smokeless tobacco: Never Used     Comment: quit  . Alcohol use No    ALLERGIES:  is allergic to ibuprofen; multivitamin [centrum]; daypro [oxaprozin]; and tape.  MEDICATIONS:  Current Outpatient Prescriptions  Medication Sig Dispense Refill  . amLODipine (NORVASC) 10 MG tablet Take 10 mg by mouth daily.     . aspirin 325 MG tablet Take 325 mg by mouth daily.    . CALCITRIOL PO Take 1 tablet by mouth daily.    . calcium acetate (PHOSLO) 667 MG capsule Take 1,334-2,001 mg by mouth See admin instructions. 3 caps with meals and 2 caps with snacks    . Difluprednate (DUREZOL) 0.05 % EMUL Place 1 drop into the left eye 4 (four) times daily.    . ENSURE PLUS (ENSURE PLUS) LIQD Take by mouth 3 (three) times a week. M,W.F    . ferrous sulfate 325 (65 FE) MG EC tablet Take 325 mg by mouth  daily with breakfast.    . fluticasone (FLONASE) 50 MCG/ACT nasal spray Place 1 spray into both nostrils daily as needed for allergies.     . furosemide (LASIX) 80 MG tablet Take 80-160 mg by mouth See admin instructions. 2 tablets in am- on sat, Sunday, Tuesday and Thursday 1 tablet in PM- on Sundays -Saturdays    . gabapentin (NEURONTIN) 100 MG capsule Take 1 capsule (100 mg total) by mouth 3 (three) times daily. (Patient taking differently: Take 200-300 mg by mouth 2 (two) times daily. ) 90 capsule 0  . glimepiride (AMARYL) 4 MG tablet Take 8 mg by mouth at bedtime.     . LEVEMIR FLEXTOUCH 100 UNIT/ML Pen Inject 25-30 Units into the skin daily at 10 pm. Depending on what he eats    . lovastatin (MEVACOR) 20 MG tablet Take 1 tablet by mouth daily.    . methylphenidate (RITALIN) 5 MG tablet Take 1 tablet by mouth daily.    . metoCLOPramide (REGLAN) 5 MG tablet Take 5 mg by mouth 2 (two) times daily. 1 TAB M,W,F 1TAB BID T,T,S,S    . metoprolol tartrate (LOPRESSOR)   100 MG tablet Take 25 mg by mouth See admin instructions. Pt takes Tues morning, Friday Night, and Saturday morning and evening    . multivitamin (RENA-VIT) TABS tablet Take 1 tablet by mouth at bedtime.     [name] neomycin-polymyxin b-dexamethasone (MAXITROL) 3.5-10000-0.1 OINT Place 1 application into the left eye 2 (two) times daily.     [name] nystatin cream (MYCOSTATIN) Apply 1 application topically 2 (two) times daily.    . prednisoLONE acetate (PRED FORTE) 1 % ophthalmic suspension Place 1 drop into the left eye 4 (four) times daily.    [name] ZIRGAN 0.15 % GEL Place 1 application into the left eye 3 (three) times daily as needed.    [name] acyclovir (ZOVIRAX) 400 MG tablet One pill a day [to prevent shingles]; Take it after dialysis on the days of dialyisis 30 tablet 3  . Epoetin Alfa (EPOGEN IJ) Inject 4,800 Units as directed every 21 ( twenty-one) days.    . Ibrutinib 280 MG TABS Take 1 capsule by mouth daily. (Patient not taking: Reported on  12/28/2016) 30 tablet 4  . silver sulfADIAZINE (SILVADENE) 1 % cream Apply 1 application topically daily. (Patient not taking: Reported on 12/28/2016) 30 g 2   No current facility-administered medications for this visit.     PHYSICAL EXAMINATION: ECOG PERFORMANCE STATUS: 3  BP (!) 154/73 (BP Location: Right Arm, Patient Position: Sitting)   Pulse 76   Temp (!) 96.2 F (35.7 C) (Tympanic)   Resp 20   Ht 5' 10" (1.778 m)   Wt 208 lb (94.3 kg)   BMI 29.84 kg/m   Filed Weights   12/28/16 1031  Weight: 208 lb (94.3 kg)    GENERAL: Well-nourished well-developed; Alert, no distress and comfortable.  Accompanied by Family.; pt is in a wheel chair. EYES: no pallor or icterus; positive for injection of the left eye OROPHARYNX: no thrush or ulceration; poor dentition.  NECK: supple, no masses felt LYMPH:  1-2 cm palpable lymphadenopathy in the bilateral axillary. 3-4cm LN felet in neck.  LUNGS: clear to auscultation and  No wheeze or crackles HEART/CVS: regular rate & rhythm and no murmurs; No lower extremity edema;  R-LE in brace.  ABDOMEN:abdomen soft, non-tender and normal bowel sounds Musculoskeletal:no cyanosis of digits and no clubbing  PSYCH: alert & oriented x 3 with fluent speech NEURO: no focal motor/sensory deficits SKIN:  no rashes or significant lesions  LABORATORY DATA:  I have reviewed the data as listed    Component Value Date/Time   NA 134 (L) May 06, 202018 1122   NA 140 11/18/2013 1030   K 4.1 May 06, 202018 1122   K 4.0 11/18/2013 1030   CL 95 (L) May 06, 202018 1122   CL 97 (L) 11/18/2013 1030   CO2 29 May 06, 202018 1122   CO2 33 (H) 11/18/2013 1030   GLUCOSE 121 (H) May 06, 202018 1122   GLUCOSE 265 (H) 11/18/2013 1030   BUN 27 (H) May 06, 202018 1122   BUN 30 (H) 11/18/2013 1030   CREATININE 5.52 (H) May 06, 202018 1122   CREATININE 5.05 (H) 11/18/2013 1030   CALCIUM 8.3 (L) May 06, 202018 1122   CALCIUM 8.6 11/18/2013 1030   PROT 6.6 10/20/2016 1132   PROT 7.0 11/18/2013 1030    ALBUMIN 3.9 10/20/2016 1132   ALBUMIN 3.7 11/18/2013 1030   AST 22 10/20/2016 1132   AST 15 11/18/2013 1030   ALT 16 (L) 10/20/2016 1132   ALT 25 11/18/2013 1030   ALKPHOS 51 10/20/2016 1132   ALKPHOS 105 11/18/2013 1030  BILITOT 0.6 10/20/2016 1132   BILITOT 0.3 11/18/2013 1030   GFRNONAA 9 (L) 10-16-202018 1122   GFRNONAA 11 (L) 11/18/2013 1030   GFRAA 11 (L) 10-16-202018 1122   GFRAA 13 (L) 11/18/2013 1030    No results found for: SPEP, UPEP  Lab Results  Component Value Date   WBC 4.1 12/28/2016   NEUTROABS 2.2 12/28/2016   HGB 9.8 (L) 12/28/2016   HCT 27.5 (L) 12/28/2016   MCV 95.5 12/28/2016   PLT 120 (L) 12/28/2016      Chemistry      Component Value Date/Time   NA 134 (L) 10-16-202018 1122   NA 140 11/18/2013 1030   K 4.1 10-16-202018 1122   K 4.0 11/18/2013 1030   CL 95 (L) 10-16-202018 1122   CL 97 (L) 11/18/2013 1030   CO2 29 10-16-202018 1122   CO2 33 (H) 11/18/2013 1030   BUN 27 (H) 10-16-202018 1122   BUN 30 (H) 11/18/2013 1030   CREATININE 5.52 (H) 10-16-202018 1122   CREATININE 5.05 (H) 11/18/2013 1030      Component Value Date/Time   CALCIUM 8.3 (L) 10-16-202018 1122   CALCIUM 8.6 11/18/2013 1030   ALKPHOS 51 10/20/2016 1132   ALKPHOS 105 11/18/2013 1030   AST 22 10/20/2016 1132   AST 15 11/18/2013 1030   ALT 16 (L) 10/20/2016 1132   ALT 25 11/18/2013 1030   BILITOT 0.6 10/20/2016 1132   BILITOT 0.3 11/18/2013 1030     IMPRESSION: 1. Further enlargement of pathologic adenopathy in the neck, chest, abdomen, and pelvis, but with similar Deauville 4 level of metabolic activity compared to the prior exam, compatible with active lymphoma. No bony involvement or splenomegaly identified. 2. 6 by 4 mm left upper lobe pulmonary nodule appears to be new and does not have hypermetabolic activity currently, surveillance suggested. 3. Several findings of indeterminate significance include some bandlike probable atelectasis or scarring medially in the right lower  lobe with faintly accentuated activity, as well as some focal subcutaneous activity in stranding in the right lower quadrant. 4. Other imaging findings of potential clinical significance: Aortic Atherosclerosis (ICD10-I70.0). Right renal cysts. Coronary atherosclerosis.   Electronically Signed   By: Van Clines M.D.   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  SEP 2018- PET scan Progression in neck bilaterally;  pelvis [increase in right pelvic LN]; stable mediastinal LN. Patient noted to have worsening lymphadenopathy on imaging/clinically; worsening anemia/thrombocytopenia.  # START treatment with ibrutinib 280 mg/once a day [given end-stage renal disease/multiple comorbidities]; however given the infectious issues [C discussion below]. Discussed the potential side effects including but not limited to easy bruising; cardiac arrhythmias; rare infections.   # Left eye keratitis- on topical anti-viral. As per ophthalmology;   # ? E.coli infection/bacteremia/sepsis-s/p IV antibiotics with nephrology. Currently resolved. Discussed with Dr.Kolluru.    # Anemia hemoglobin 9.8 multifactorial; end-stage renal disease/CLL- okay to proceed with Neupogen as needed.   # Infectious prophylaxis- add acyclovir 400 mg/day [after HD]; new prescription given.  # follow up in 2 weeks. Discussed with Dr. Juleen China.   Orders Placed This Encounter  Procedures  . CBC with Differential/Platelet    Standing Status:   Future    Standing Expiration Date:   12/28/2017       Cammie Sickle, MD 12/28/2016 3:13 PM

## 2016-12-28 NOTE — Assessment & Plan Note (Addendum)
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  SEP 2018- PET scan Progression in neck bilaterally;  pelvis [increase in right pelvic LN]; stable mediastinal LN. Patient noted to have worsening lymphadenopathy on imaging/clinically; worsening anemia/thrombocytopenia.  # START treatment with ibrutinib 280 mg/once a day [given end-stage renal disease/multiple comorbidities]; however given the infectious issues [C discussion below]. Discussed the potential side effects including but not limited to easy bruising; cardiac arrhythmias; rare infections.   # Left eye keratitis- on topical anti-viral. As per ophthalmology;   # ? E.coli infection/bacteremia/sepsis-s/p IV antibiotics with nephrology. Currently resolved. Discussed with Dr.Kolluru.    # Anemia hemoglobin 9.8 multifactorial; end-stage renal disease/CLL- okay to proceed with Neupogen as needed.   # Infectious prophylaxis- add acyclovir 400 mg/day [after HD]; new prescription given.  # follow up in 2 weeks. Discussed with Dr. Juleen China.

## 2016-12-29 LAB — IGG, IGA, IGM
IGA: 57 mg/dL — AB (ref 61–437)
IgG (Immunoglobin G), Serum: 537 mg/dL — ABNORMAL LOW (ref 700–1600)
IgM (Immunoglobulin M), Srm: 27 mg/dL (ref 15–143)

## 2017-01-03 ENCOUNTER — Telehealth: Payer: Self-pay | Admitting: *Deleted

## 2017-01-03 NOTE — Telephone Encounter (Signed)
Lab appointment added for 830, but had to send high priority ms to scheduler to add patient double book at [facility]

## 2017-01-03 NOTE — Telephone Encounter (Signed)
Wife called to report that he has developed a rash and bruising around his fistula and bleeding hemorrhoids since starting his new chemotherapy. Please advise

## 2017-01-03 NOTE — Telephone Encounter (Signed)
Labs at 830 am and dbl book md at 9am (cbc, ptt/ptt)

## 2017-01-03 NOTE — Telephone Encounter (Signed)
I spoke with wife and she will let daughter know to bring him at 1

## 2017-01-04 ENCOUNTER — Other Ambulatory Visit: Payer: Self-pay | Admitting: *Deleted

## 2017-01-04 ENCOUNTER — Inpatient Hospital Stay: Payer: Medicare HMO | Attending: Internal Medicine

## 2017-01-04 ENCOUNTER — Inpatient Hospital Stay (HOSPITAL_BASED_OUTPATIENT_CLINIC_OR_DEPARTMENT_OTHER): Payer: Medicare HMO | Admitting: Internal Medicine

## 2017-01-04 DIAGNOSIS — Z992 Dependence on renal dialysis: Secondary | ICD-10-CM | POA: Insufficient documentation

## 2017-01-04 DIAGNOSIS — K219 Gastro-esophageal reflux disease without esophagitis: Secondary | ICD-10-CM | POA: Insufficient documentation

## 2017-01-04 DIAGNOSIS — H169 Unspecified keratitis: Secondary | ICD-10-CM | POA: Diagnosis not present

## 2017-01-04 DIAGNOSIS — N281 Cyst of kidney, acquired: Secondary | ICD-10-CM | POA: Insufficient documentation

## 2017-01-04 DIAGNOSIS — C8303 Small cell B-cell lymphoma, intra-abdominal lymph nodes: Secondary | ICD-10-CM

## 2017-01-04 DIAGNOSIS — C8308 Small cell B-cell lymphoma, lymph nodes of multiple sites: Secondary | ICD-10-CM

## 2017-01-04 DIAGNOSIS — T148XXA Other injury of unspecified body region, initial encounter: Secondary | ICD-10-CM

## 2017-01-04 DIAGNOSIS — I2510 Atherosclerotic heart disease of native coronary artery without angina pectoris: Secondary | ICD-10-CM | POA: Insufficient documentation

## 2017-01-04 DIAGNOSIS — Z79899 Other long term (current) drug therapy: Secondary | ICD-10-CM | POA: Diagnosis not present

## 2017-01-04 DIAGNOSIS — Z794 Long term (current) use of insulin: Secondary | ICD-10-CM | POA: Diagnosis not present

## 2017-01-04 DIAGNOSIS — M21371 Foot drop, right foot: Secondary | ICD-10-CM | POA: Diagnosis not present

## 2017-01-04 DIAGNOSIS — N186 End stage renal disease: Secondary | ICD-10-CM | POA: Insufficient documentation

## 2017-01-04 DIAGNOSIS — K589 Irritable bowel syndrome without diarrhea: Secondary | ICD-10-CM | POA: Insufficient documentation

## 2017-01-04 DIAGNOSIS — E1122 Type 2 diabetes mellitus with diabetic chronic kidney disease: Secondary | ICD-10-CM | POA: Insufficient documentation

## 2017-01-04 DIAGNOSIS — D696 Thrombocytopenia, unspecified: Secondary | ICD-10-CM | POA: Insufficient documentation

## 2017-01-04 DIAGNOSIS — Z87891 Personal history of nicotine dependence: Secondary | ICD-10-CM | POA: Insufficient documentation

## 2017-01-04 DIAGNOSIS — I120 Hypertensive chronic kidney disease with stage 5 chronic kidney disease or end stage renal disease: Secondary | ICD-10-CM | POA: Insufficient documentation

## 2017-01-04 DIAGNOSIS — Z7982 Long term (current) use of aspirin: Secondary | ICD-10-CM | POA: Diagnosis not present

## 2017-01-04 DIAGNOSIS — Z8673 Personal history of transient ischemic attack (TIA), and cerebral infarction without residual deficits: Secondary | ICD-10-CM | POA: Insufficient documentation

## 2017-01-04 LAB — CBC WITH DIFFERENTIAL/PLATELET
Basophils Absolute: 0 10*3/uL (ref 0–0.1)
Basophils Relative: 1 %
Eosinophils Absolute: 0.3 10*3/uL (ref 0–0.7)
Eosinophils Relative: 4 %
HEMATOCRIT: 27.9 % — AB (ref 40.0–52.0)
HEMOGLOBIN: 9.9 g/dL — AB (ref 13.0–18.0)
Lymphocytes Relative: 54 %
Lymphs Abs: 3.1 10*3/uL (ref 1.0–3.6)
MCH: 34.2 pg — ABNORMAL HIGH (ref 26.0–34.0)
MCHC: 35.3 g/dL (ref 32.0–36.0)
MCV: 96.7 fL (ref 80.0–100.0)
MONOS PCT: 2 %
Monocytes Absolute: 0.1 10*3/uL — ABNORMAL LOW (ref 0.2–1.0)
NEUTROS ABS: 2.3 10*3/uL (ref 1.4–6.5)
NEUTROS PCT: 39 %
Platelets: 111 10*3/uL — ABNORMAL LOW (ref 150–440)
RBC: 2.89 MIL/uL — ABNORMAL LOW (ref 4.40–5.90)
RDW: 14.9 % — ABNORMAL HIGH (ref 11.5–14.5)
WBC: 5.8 10*3/uL (ref 3.8–10.6)

## 2017-01-04 NOTE — Addendum Note (Signed)
Addended by: Sandria Bales B on: 01/04/2017 11:04 AM   Modules accepted: Orders

## 2017-01-04 NOTE — Progress Notes (Signed)
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 Bone marrow biopsy on 03/05/13 with mildly hypercellular marrow for age 71-50% with interstitial predominantly small B-lymphocytic infiltrate estimated about 20-30% of marrow cells, mild nonspecific dyserythropoiesis, storage iron present this. Flow cytometry showed 29% CD5+ clonal B-cell population which is CD45+, CD5+, CD19+, CD20+, CD22+, CD23+, CD38+, HLA-DR+, Slg kappa+, and is CD10-, CD11b-, FMC7-. 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 PET scan on 03/03/13 with extensive hypermetabolic lymphadenopathy.  (presented with progressive Thrombocytopenia with persistent Anemia. CBC on 01/16/13 showed hemoglobin 10.0, MCV 98, platelets 80, WBC 4890 with 40% neutrophils, 54% lymphocytes, 1.4% monocytes. On Epogen and Venofer at dialysis treatments. Further workup showed small M-spike of 0.2).   Got Treanda/Rituxan x2 cycles in Dec 2014/Jan 2015. Then on single agent Rituxan q 8 weeks (got #3 dose on 09/16/13). --------------------------------------------------------------- ----------------------  # DEC 2014- SMALL CELL LYMPHOMA/CLL- STAGE IV [BMBx- 03/2013- 71-50% with interstitial CD 5+/CD 20+]; FISH- Trisomy 12;DEC 2014-Treanda-Rituxan x2 [held sec to prolonged hospital/pneumoani/HD]; Rituxa q8W [last June 2015; Held sec to Superficial Bladder ca]; CT- JUNE 2017- STABLE Mediastinal LN/supraclav LN/pelvic LN; CT DEC 2017- mild progression noted ~1.5-2cm mediastinal/Ax LN. Cont surveillance  # SEP 2018- PROGRESSION on PET scan-  # SEP 27th 2018- Ibrutinib 280 mg/day [after HD];   # 96m RLL lung nodule- F/u in 6 M [Hx of smoking quit 2015]  # ESRD on HD [Dr.Voora/Dr.Kolluru]; DM; Non-healing ulcers in feet/Hx R foot  drop- walker ambulation  #Noninvasive Bladder cancer/recent UTIs-currently resolved.     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  Patient noted to have a bruise around the dialysis fistula 3 days ago. This morning is improved. No skin rash otherwise no itch. History of hemorrhoids- noted to have small blood with wiping; none in stools.  Notes to have improvement of appetite. No nausea no vomiting. No diarrhea. Denies any significant improvement of his neck lymph nodes. Patient denies any unusual weight loss or night sweats. Denies any new lumps or bumps any where else. Denies any shortness of breath or cough.  No fevers or chills. States his left eye seems to be getting better.   REVIEW OF SYSTEMS:  A complete 10 point review of system is done which is negative except mentioned above/history of present illness.   PAST MEDICAL HISTORY :  Past Medical History:  Diagnosis Date  . Anemia   . Arthritis   . Bladder cancer ([facility]   . Chronic kidney disease   . Diabetes mellitus without complication ([facility]   . Dialysis patient ([facility]    M,W,F  . Dyspnea    DOE  . GERD (gastroesophageal reflux disease)   . HOH (hard of hearing)   . Hypertension   . IBS (irritable bowel syndrome)   . Lymphoma ([facility] 09/27/2014  . Neuropathy    RIGHT LEG  . Stroke ([facility]    TIA    PAST SURGICAL HISTORY :   Past Surgical History:  Procedure Laterality Date  . BACK SURGERY  2007  . CATARACT EXTRACTION W/PHACO Left 03/23/2016   Procedure: CATARACT EXTRACTION PHACO AND INTRAOCULAR LENS PLACEMENT (IOC);  Surgeon: Leandrew Koyanagi, MD;  Location: [facility] ORS;  Service: Ophthalmology;  Laterality:  Left;  [place] 52.6 AP% 14.7 CDE 7.72 Fluid pack lot # [id number]  . CATARACT EXTRACTION W/PHACO Right 05/30/2016   Procedure: CATARACT EXTRACTION PHACO AND INTRAOCULAR LENS PLACEMENT ([place]);  Surgeon: Leandrew Koyanagi, MD;  Location: [facility] ORS;  Service: Ophthalmology;  Laterality: Right;  [place] 01:08 AP% 13.9 CDE 9.53  note: could not get IV in patient, so procedure done without anesthesia personell present, ok per Dr Wallace Going fluid pack lo t# [id number]  . CIRCUMCISION    . PERIPHERAL VASCULAR CATHETERIZATION Left 08/19/2015   Procedure: A/V Shuntogram/Fistulagram;  Surgeon: Algernon Huxley, MD;  Location: [facility];  Service: Cardiovascular;  Laterality: Left;  . PERIPHERAL VASCULAR CATHETERIZATION N/A 08/19/2015   Procedure: A/V Shunt Intervention;  Surgeon: Algernon Huxley, MD;  Location: [place];  Service: Cardiovascular;  Laterality: N/A;    FAMILY HISTORY :   Family History  Problem Relation Age of Onset  . Cancer Mother   . Kidney cancer Neg Hx   . Prostate cancer Neg Hx   . Kidney failure Neg Hx   . Bladder Cancer Neg Hx     SOCIAL HISTORY:   Social History  Substance Use Topics  . Smoking status: Former Smoker    Types: Cigarettes    Quit date: 05/19/2013  . Smokeless tobacco: Never Used     Comment: quit  . Alcohol use No    ALLERGIES:  is allergic to ibuprofen; multivitamin [centrum]; daypro [oxaprozin]; and tape.  MEDICATIONS:  Current Outpatient Prescriptions  Medication Sig Dispense Refill  . acyclovir (ZOVIRAX) 400 MG tablet One pill a day [to prevent shingles]; Take it after dialysis on the days of dialyisis 30 tablet 3  . amLODipine (NORVASC) 10 MG tablet Take 10 mg by mouth daily.     [name] aspirin 325 MG tablet Take 325 mg by mouth daily.    [name] CALCITRIOL PO Take 1 tablet by mouth daily.    . calcium acetate (PHOSLO) 667 MG capsule Take 1,334-2,001 mg by mouth See admin instructions. 3 caps with meals and 2 caps with snacks    . Difluprednate  (DUREZOL) 0.05 % EMUL Place 1 drop into the left eye 4 (four) times daily.    [name] ENSURE PLUS (ENSURE PLUS) LIQD Take by mouth 3 (three) times a week. M,W.F    . ferrous sulfate 325 (65 FE) MG EC tablet Take 325 mg by mouth daily with breakfast.    . fluticasone (FLONASE) 50 MCG/ACT nasal spray Place 1 spray into both nostrils daily as needed for allergies.     . furosemide (LASIX) 80 MG tablet Take 80-160 mg by mouth See admin instructions. 2 tablets in am- on sat, Sunday, Tuesday and Thursday 1 tablet in PM- on Sundays -Saturdays    . gabapentin (NEURONTIN) 100 MG capsule Take 1 capsule (100 mg total) by mouth 3 (three) times daily. (Patient taking differently: Take 200-300 mg by mouth 2 (two) times daily. ) 90 capsule 0  . glimepiride (AMARYL) 4 MG tablet Take 8 mg by mouth at bedtime.     . Ibrutinib 280 MG TABS Take 1 capsule by mouth daily. 30 tablet 4  . LEVEMIR FLEXTOUCH 100 UNIT/ML Pen Inject 25-30 Units into the skin daily at 10 pm. Depending on what he eats    .  lovastatin (MEVACOR) 20 MG tablet Take 1 tablet by mouth daily.    . methylphenidate (RITALIN) 5 MG tablet Take 1 tablet by mouth daily.    . metoCLOPramide (REGLAN) 5 MG tablet Take 5 mg by mouth 2 (two) times daily. 1 TAB M,W,F 1TAB BID T,T,S,S    . metoprolol tartrate (LOPRESSOR) 100 MG tablet Take 25 mg by mouth See admin instructions. Pt takes Tues morning, Friday Night, and Saturday morning and evening    . multivitamin (RENA-VIT) TABS tablet Take 1 tablet by mouth at bedtime.     [name] neomycin-polymyxin b-dexamethasone (MAXITROL) 3.5-10000-0.1 OINT Place 1 application into the left eye 2 (two) times daily.     [name] nystatin cream (MYCOSTATIN) Apply 1 application topically 2 (two) times daily.    . silver sulfADIAZINE (SILVADENE) 1 % cream Apply 1 application topically daily. 30 g 2  . ZIRGAN 0.15 % GEL Place 1 application into the left eye 3 (three) times daily as needed.    [name] Epoetin Alfa (EPOGEN IJ) Inject 4,800 Units as  directed every 21 ( twenty-one) days.    . prednisoLONE acetate (PRED FORTE) 1 % ophthalmic suspension Place 1 drop into the left eye 4 (four) times daily.     No current facility-administered medications for this visit.     PHYSICAL EXAMINATION: ECOG PERFORMANCE STATUS: 3  BP (!) 165/73 (BP Location: Right Arm, Patient Position: Sitting)   Pulse (!) 59   Temp (!) 97.4 F (36.3 C) (Tympanic)   Resp 16   Wt 208 lb (94.3 kg)   BMI 29.84 kg/m   Filed Weights   01/04/17 0937  Weight: 208 lb (94.3 kg)    GENERAL: Well-nourished well-developed; Alert, no distress and comfortable.  Accompanied by Family.; pt is in a wheel chair. EYES: no pallor or icterus; positive for injection of the left eye OROPHARYNX: no thrush or ulceration; poor dentition.  NECK: supple, no masses felt LYMPH:  1-2 cm palpable lymphadenopathy in the bilateral axillary. 3-4cm LN felet in neck.  LUNGS: clear to auscultation and  No wheeze or crackles HEART/CVS: regular rate & rhythm and no murmurs; No lower extremity edema;  R-LE in brace.  ABDOMEN:abdomen soft, non-tender and normal bowel sounds Musculoskeletal:no cyanosis of digits and no clubbing  PSYCH: alert & oriented x 3 with fluent speech NEURO: no focal motor/sensory deficits SKIN:  Left arm fistula noted; bruises noted around it. Improving.  LABORATORY DATA:  I have reviewed the data as listed    Component Value Date/Time   NA 134 (L) 07/25/202018 1122   NA 140 11/18/2013 1030   K 4.1 07/25/202018 1122   K 4.0 11/18/2013 1030   CL 95 (L) 07/25/202018 1122   CL 97 (L) 11/18/2013 1030   CO2 29 07/25/202018 1122   CO2 33 (H) 11/18/2013 1030   GLUCOSE 121 (H) 07/25/202018 1122   GLUCOSE 265 (H) 11/18/2013 1030   BUN 27 (H) 07/25/202018 1122   BUN 30 (H) 11/18/2013 1030   CREATININE 5.52 (H) 07/25/202018 1122   CREATININE 5.05 (H) 11/18/2013 1030   CALCIUM 8.3 (L) 07/25/202018 1122   CALCIUM 8.6 11/18/2013 1030   PROT 6.6 10/20/2016 1132   PROT 7.0  11/18/2013 1030   ALBUMIN 3.9 10/20/2016 1132   ALBUMIN 3.7 11/18/2013 1030   AST 22 10/20/2016 1132   AST 15 11/18/2013 1030   ALT 16 (L) 10/20/2016 1132   ALT 25 11/18/2013 1030   ALKPHOS 51 10/20/2016 1132   ALKPHOS 105 11/18/2013  1030   BILITOT 0.6 10/20/2016 1132   BILITOT 0.3 11/18/2013 1030   GFRNONAA 9 (L) 08-Jan-202018 1122   GFRNONAA 11 (L) 11/18/2013 1030   GFRAA 11 (L) 08-Jan-202018 1122   GFRAA 13 (L) 11/18/2013 1030    No results found for: SPEP, UPEP  Lab Results  Component Value Date   WBC 5.8 01/04/2017   NEUTROABS 2.3 01/04/2017   HGB 9.9 (L) 01/04/2017   HCT 27.9 (L) 01/04/2017   MCV 96.7 01/04/2017   PLT 111 (L) 01/04/2017      Chemistry      Component Value Date/Time   NA 134 (L) 08-Jan-202018 1122   NA 140 11/18/2013 1030   K 4.1 08-Jan-202018 1122   K 4.0 11/18/2013 1030   CL 95 (L) 08-Jan-202018 1122   CL 97 (L) 11/18/2013 1030   CO2 29 08-Jan-202018 1122   CO2 33 (H) 11/18/2013 1030   BUN 27 (H) 08-Jan-202018 1122   BUN 30 (H) 11/18/2013 1030   CREATININE 5.52 (H) 08-Jan-202018 1122   CREATININE 5.05 (H) 11/18/2013 1030      Component Value Date/Time   CALCIUM 8.3 (L) 08-Jan-202018 1122   CALCIUM 8.6 11/18/2013 1030   ALKPHOS 51 10/20/2016 1132   ALKPHOS 105 11/18/2013 1030   AST 22 10/20/2016 1132   AST 15 11/18/2013 1030   ALT 16 (L) 10/20/2016 1132   ALT 25 11/18/2013 1030   BILITOT 0.6 10/20/2016 1132   BILITOT 0.3 11/18/2013 1030     IMPRESSION: 1. Further enlargement of pathologic adenopathy in the neck, chest, abdomen, and pelvis, but with similar [place] 4 level of metabolic activity compared to the prior exam, compatible with active lymphoma. No bony involvement or splenomegaly identified. 2. 6 by 4 mm left upper lobe pulmonary nodule appears to be new and does not have hypermetabolic activity currently, surveillance suggested. 3. Several findings of indeterminate significance include some bandlike probable atelectasis or scarring medially  in the right lower lobe with faintly accentuated activity, as well as some focal subcutaneous activity in stranding in the right lower quadrant. 4. Other imaging findings of potential clinical significance: Aortic Atherosclerosis (ICD10-I70.0). Right renal cysts. Coronary atherosclerosis.   Electronically Signed   By: Van Clines M.D.   On: 11/30/2016 14:42  ASSESSMENT & PLAN:   Small B-cell lymphoma of intra-abdominal lymph nodes ([place]) # SLL/CLL- stage IV  SEP 2018- PET scan Progression in neck bilaterally;  pelvis [increase in right pelvic LN]; stable mediastinal LN. Patient noted to have worsening lymphadenopathy on imaging/clinically; worsening anemia/thrombocytopenia.  # Currently on ibrutinib 280 mg a day [since September 27]-? Clinical response [ patient notes to have improvement of energy /appetite]. However noted to have easy bruising [see discussion below]  # Left hand bruise- multifactorial end-stage renal disease/aspirin 325 mg a day/ ibrutinib. Recommend   decrease the dose of asprin to 81 mg/day [patient had not had any recent cardioembolic events].   # Left eye keratitis- on topical anti-viral. As per ophthalmology; On acyclovir- improving.  # Anemia hemoglobin 9.8 multifactorial; end-stage renal disease/CLL- okay to proceed with Epogen as needed.   # Infectious prophylaxis- on acyclovir 400 mg/day [after HD]; tolerating well;  helping his keratitis.  # follow up as planned.   Cc; Dr.Kolluru.    No orders of the defined types were placed in this encounter.      Cammie Sickle, MD 01/04/2017 9:58 AM

## 2017-01-04 NOTE — Assessment & Plan Note (Addendum)
#   SLL/CLL- stage IV  SEP 2018- PET scan Progression in neck bilaterally;  pelvis [increase in right pelvic LN]; stable mediastinal LN. Patient noted to have worsening lymphadenopathy on imaging/clinically; worsening anemia/thrombocytopenia.  # Currently on ibrutinib 280 mg a day [since September 27]-? Clinical response [ patient notes to have improvement of energy /appetite]. However noted to have easy bruising [see discussion below]  # Left hand bruise- multifactorial end-stage renal disease/aspirin 325 mg a day/ ibrutinib. Recommend   decrease the dose of asprin to 81 mg/day [patient had not had any recent cardioembolic events].   # Left eye keratitis- on topical anti-viral. As per ophthalmology; On acyclovir- improving.  # Anemia hemoglobin 9.8 multifactorial; end-stage renal disease/CLL- okay to proceed with Epogen as needed.   # Infectious prophylaxis- on acyclovir 400 mg/day [after HD]; tolerating well;  helping his keratitis.  # follow up as planned.   Cc; Dr.Kolluru.

## 2017-01-11 ENCOUNTER — Inpatient Hospital Stay: Payer: Medicare HMO

## 2017-01-11 ENCOUNTER — Inpatient Hospital Stay: Payer: Medicare HMO | Admitting: Internal Medicine

## 2017-01-11 NOTE — Assessment & Plan Note (Deleted)
#   SLL/CLL- stage IV  SEP 2018- PET scan Progression in neck bilaterally;  pelvis [increase in right pelvic LN]; stable mediastinal LN. Patient noted to have worsening lymphadenopathy on imaging/clinically; worsening anemia/thrombocytopenia.  # Currently on ibrutinib 280 mg a day [since September 27]-? Clinical response [ patient notes to have improvement of energy /appetite]. However noted to have easy bruising [see discussion below]  # Left hand bruise- multifactorial end-stage renal disease/aspirin 325 mg a day/ ibrutinib. Recommend   decrease the dose of asprin to 81 mg/day [patient had not had any recent cardioembolic events].   # Left eye keratitis- on topical anti-viral. As per ophthalmology; On acyclovir- improving.  # Anemia hemoglobin 9.8 multifactorial; end-stage renal disease/CLL- okay to proceed with Epogen as needed.   # Infectious prophylaxis- on acyclovir 400 mg/day [after HD]; tolerating well;  helping his keratitis.  # follow up as planned.   Cc; Dr.Kolluru.

## 2017-01-11 NOTE — Progress Notes (Deleted)
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 Bone marrow biopsy on 03/05/13 with mildly hypercellular marrow for age 71-50% with interstitial predominantly small B-lymphocytic infiltrate estimated about 20-30% of marrow cells, mild nonspecific dyserythropoiesis, storage iron present this. Flow cytometry showed 29% CD5+ clonal B-cell population which is CD45+, CD5+, CD19+, CD20+, CD22+, CD23+, CD38+, HLA-DR+, Slg kappa+, and is CD10-, CD11b-, FMC7-. 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 PET scan on 03/03/13 with extensive hypermetabolic lymphadenopathy.  (presented with progressive Thrombocytopenia with persistent Anemia. CBC on 01/16/13 showed hemoglobin 10.0, MCV 98, platelets 80, WBC 4890 with 40% neutrophils, 54% lymphocytes, 1.4% monocytes. On Epogen and Venofer at dialysis treatments. Further workup showed small M-spike of 0.2).   Got Treanda/Rituxan x2 cycles in Dec 2014/Jan 2015. Then on single agent Rituxan q 8 weeks (got #3 dose on 09/16/13). --------------------------------------------------------------- ----------------------  # DEC 2014- SMALL CELL LYMPHOMA/CLL- STAGE IV [BMBx- 03/2013- 20-30% B cell infiltrate; CD 5+/CD 20+]; FISH- Trisomy 12;DEC 2014-Treanda-Rituxan x2 [held sec to prolonged hospital/pneumoani/HD]; Rituxa q8W [last June 2015; Held sec to Superficial Bladder ca]; CT- JUNE 2017- STABLE Mediastinal LN/supraclav LN/pelvic LN; CT DEC 2017- mild progression noted ~1.5-2cm mediastinal/Ax LN. Cont surveillance  # SEP 2018- PROGRESSION on PET scan-  # SEP 27th 2018- Ibrutinib 280 mg/day [after HD];   # 96m RLL lung nodule- F/u in 6 M [Hx of smoking quit 2015]  # ESRD on HD [Dr.Voora/Dr.Kolluru]; DM; Non-healing ulcers in feet/Hx R foot  drop- walker ambulation  #Noninvasive Bladder cancer/recent UTIs-currently resolved.     Small B-cell lymphoma of intra-abdominal lymph nodes (HCC)     INTERVAL HISTORY:  A very pleasant 71year old African-American male patient with above history of SLL/CLL  With ESRD on HD; Started on ibrutinib approximately 1 week ago [September 27th] 280 milligrams a day is here to discuss- easy bruising/bleeding from the dialysis fistula Site/bruise.  Patient noted to have a bruise around the dialysis fistula 3 days ago. This morning is improved. No skin rash otherwise no itch. History of hemorrhoids- noted to have small blood with wiping; none in stools.  Notes to have improvement of appetite. No nausea no vomiting. No diarrhea. Denies any significant improvement of his neck lymph nodes. Patient denies any unusual weight loss or night sweats. Denies any new lumps or bumps any where else. Denies any shortness of breath or cough.  No fevers or chills. States his left eye seems to be getting better.   REVIEW OF SYSTEMS:  A complete 10 point review of system is done which is negative except mentioned above/history of present illness.   PAST MEDICAL HISTORY :  Past Medical History:  Diagnosis Date  . Anemia   . Arthritis   . Bladder cancer ([facility]   . Chronic kidney disease   . Diabetes mellitus without complication ([facility]   . Dialysis patient ([facility]    M,W,F  . Dyspnea    DOE  . GERD (gastroesophageal reflux disease)   . HOH (hard of hearing)   . Hypertension   . IBS (irritable bowel syndrome)   . Lymphoma ([facility] 09/27/2014  . Neuropathy    RIGHT LEG  . Stroke ([facility]    TIA    PAST SURGICAL HISTORY :   Past Surgical History:  Procedure Laterality Date  . BACK SURGERY  2007  . CATARACT EXTRACTION W/PHACO Left 03/23/2016   Procedure: CATARACT EXTRACTION PHACO AND INTRAOCULAR LENS PLACEMENT (IOC);  Surgeon: Leandrew Koyanagi, MD;  Location: [facility] ORS;  Service: Ophthalmology;  Laterality:  Left;  [place] 52.6 AP% 14.7 CDE 7.72 Fluid pack lot # [id number]  . CATARACT EXTRACTION W/PHACO Right 05/30/2016   Procedure: CATARACT EXTRACTION PHACO AND INTRAOCULAR LENS PLACEMENT ([place]);  Surgeon: Leandrew Koyanagi, MD;  Location: [facility] ORS;  Service: Ophthalmology;  Laterality: Right;  [place] 01:08 AP% 13.9 CDE 9.53  note: could not get IV in patient, so procedure done without anesthesia personell present, ok per Dr Wallace Going fluid pack lo t# [id number]  . CIRCUMCISION    . PERIPHERAL VASCULAR CATHETERIZATION Left 08/19/2015   Procedure: A/V Shuntogram/Fistulagram;  Surgeon: Algernon Huxley, MD;  Location: [place] CV LAB;  Service: Cardiovascular;  Laterality: Left;  . PERIPHERAL VASCULAR CATHETERIZATION N/A 08/19/2015   Procedure: A/V Shunt Intervention;  Surgeon: Algernon Huxley, MD;  Location: [place] CV LAB;  Service: Cardiovascular;  Laterality: N/A;    FAMILY HISTORY :   Family History  Problem Relation Age of Onset  . Cancer Mother   . Kidney cancer Neg Hx   . Prostate cancer Neg Hx   . Kidney failure Neg Hx   . Bladder Cancer Neg Hx     SOCIAL HISTORY:   Social History  Substance Use Topics  . Smoking status: Former Smoker    Types: Cigarettes    Quit date: 05/19/2013  . Smokeless tobacco: Never Used     Comment: quit  . Alcohol use No    ALLERGIES:  is allergic to ibuprofen; multivitamin [centrum]; daypro [oxaprozin]; and tape.  MEDICATIONS:  Current Outpatient Prescriptions  Medication Sig Dispense Refill  . acyclovir (ZOVIRAX) 400 MG tablet One pill a day [to prevent shingles]; Take it after dialysis on the days of dialyisis 30 tablet 3  . amLODipine (NORVASC) 10 MG tablet Take 10 mg by mouth daily.     [name] aspirin 325 MG tablet Take 325 mg by mouth daily.    [name] CALCITRIOL PO Take 1 tablet by mouth daily.    . calcium acetate (PHOSLO) 667 MG capsule Take 1,334-2,001 mg by mouth See admin instructions. 3 caps with meals and 2 caps with snacks    . Difluprednate  (DUREZOL) 0.05 % EMUL Place 1 drop into the left eye 4 (four) times daily.    [name] ENSURE PLUS (ENSURE PLUS) LIQD Take by mouth 3 (three) times a week. M,W.F    . Epoetin Alfa (EPOGEN IJ) Inject 4,800 Units as directed every 21 ( twenty-one) days.    . ferrous sulfate 325 (65 FE) MG EC tablet Take 325 mg by mouth daily with breakfast.    . fluticasone (FLONASE) 50 MCG/ACT nasal spray Place 1 spray into both nostrils daily as needed for allergies.     . furosemide (LASIX) 80 MG tablet Take 80-160 mg by mouth See admin instructions. 2 tablets in am- on sat, Sunday, Tuesday and Thursday 1 tablet in PM- on Sundays -Saturdays    . gabapentin (NEURONTIN) 100 MG capsule Take 1 capsule (100 mg total) by mouth 3 (three) times daily. (Patient taking differently: Take 200-300 mg by mouth 2 (two) times daily. ) 90 capsule 0  . glimepiride (AMARYL) 4 MG tablet Take 8 mg by mouth at bedtime.     . Ibrutinib 280 MG TABS Take 1 capsule by mouth daily. 30 tablet 4  . LEVEMIR FLEXTOUCH 100 UNIT/ML  Pen Inject 25-30 Units into the skin daily at 10 pm. Depending on what he eats    . lovastatin (MEVACOR) 20 MG tablet Take 1 tablet by mouth daily.    . methylphenidate (RITALIN) 5 MG tablet Take 1 tablet by mouth daily.    . metoCLOPramide (REGLAN) 5 MG tablet Take 5 mg by mouth 2 (two) times daily. 1 TAB M,W,F 1TAB BID T,T,S,S    . metoprolol tartrate (LOPRESSOR) 100 MG tablet Take 25 mg by mouth See admin instructions. Pt takes Tues morning, Friday Night, and Saturday morning and evening    . multivitamin (RENA-VIT) TABS tablet Take 1 tablet by mouth at bedtime.     [name] neomycin-polymyxin b-dexamethasone (MAXITROL) 3.5-10000-0.1 OINT Place 1 application into the left eye 2 (two) times daily.     [name] nystatin cream (MYCOSTATIN) Apply 1 application topically 2 (two) times daily.    . prednisoLONE acetate (PRED FORTE) 1 % ophthalmic suspension Place 1 drop into the left eye 4 (four) times daily.    . silver sulfADIAZINE  (SILVADENE) 1 % cream Apply 1 application topically daily. 30 g 2  . ZIRGAN 0.15 % GEL Place 1 application into the left eye 3 (three) times daily as needed.     No current facility-administered medications for this visit.     PHYSICAL EXAMINATION: ECOG PERFORMANCE STATUS: 3  There were no vitals taken for this visit.  There were no vitals filed for this visit.  GENERAL: Well-nourished well-developed; Alert, no distress and comfortable.  Accompanied by Family.; pt is in a wheel chair. EYES: no pallor or icterus; positive for injection of the left eye OROPHARYNX: no thrush or ulceration; poor dentition.  NECK: supple, no masses felt LYMPH:  1-2 cm palpable lymphadenopathy in the bilateral axillary. 3-4cm LN felet in neck.  LUNGS: clear to auscultation and  No wheeze or crackles HEART/CVS: regular rate & rhythm and no murmurs; No lower extremity edema;  R-LE in brace.  ABDOMEN:abdomen soft, non-tender and normal bowel sounds Musculoskeletal:no cyanosis of digits and no clubbing  PSYCH: alert & oriented x 3 with fluent speech NEURO: no focal motor/sensory deficits SKIN:  Left arm fistula noted; bruises noted around it. Improving.  LABORATORY DATA:  I have reviewed the data as listed    Component Value Date/Time   NA 134 (L) 2020-09-616 1122   NA 140 11/18/2013 1030   K 4.1 2020-09-616 1122   K 4.0 11/18/2013 1030   CL 95 (L) 2020-09-616 1122   CL 97 (L) 11/18/2013 1030   CO2 29 2020-09-616 1122   CO2 33 (H) 11/18/2013 1030   GLUCOSE 121 (H) 2020-09-616 1122   GLUCOSE 265 (H) 11/18/2013 1030   BUN 27 (H) 2020-09-616 1122   BUN 30 (H) 11/18/2013 1030   CREATININE 5.52 (H) 2020-09-616 1122   CREATININE 5.05 (H) 11/18/2013 1030   CALCIUM 8.3 (L) 2020-09-616 1122   CALCIUM 8.6 11/18/2013 1030   PROT 6.6 10/20/2016 1132   PROT 7.0 11/18/2013 1030   ALBUMIN 3.9 10/20/2016 1132   ALBUMIN 3.7 11/18/2013 1030   AST 22 10/20/2016 1132   AST 15 11/18/2013 1030   ALT 16 (L) 10/20/2016  1132   ALT 25 11/18/2013 1030   ALKPHOS 51 10/20/2016 1132   ALKPHOS 105 11/18/2013 1030   BILITOT 0.6 10/20/2016 1132   BILITOT 0.3 11/18/2013 1030   GFRNONAA 9 (L) 2020-09-616 1122   GFRNONAA 11 (L) 11/18/2013 1030   GFRAA 11 (L) 2020-09-616 1122   GFRAA  13 (L) 11/18/2013 1030    No results found for: SPEP, UPEP  Lab Results  Component Value Date   WBC 5.8 01/04/2017   NEUTROABS 2.3 01/04/2017   HGB 9.9 (L) 01/04/2017   HCT 27.9 (L) 01/04/2017   MCV 96.7 01/04/2017   PLT 111 (L) 01/04/2017      Chemistry      Component Value Date/Time   NA 134 (L) 2020-06-2316 1122   NA 140 11/18/2013 1030   K 4.1 2020-06-2316 1122   K 4.0 11/18/2013 1030   CL 95 (L) 2020-06-2316 1122   CL 97 (L) 11/18/2013 1030   CO2 29 2020-06-2316 1122   CO2 33 (H) 11/18/2013 1030   BUN 27 (H) 2020-06-2316 1122   BUN 30 (H) 11/18/2013 1030   CREATININE 5.52 (H) 2020-06-2316 1122   CREATININE 5.05 (H) 11/18/2013 1030      Component Value Date/Time   CALCIUM 8.3 (L) 2020-06-2316 1122   CALCIUM 8.6 11/18/2013 1030   ALKPHOS 51 10/20/2016 1132   ALKPHOS 105 11/18/2013 1030   AST 22 10/20/2016 1132   AST 15 11/18/2013 1030   ALT 16 (L) 10/20/2016 1132   ALT 25 11/18/2013 1030   BILITOT 0.6 10/20/2016 1132   BILITOT 0.3 11/18/2013 1030     IMPRESSION: 1. Further enlargement of pathologic adenopathy in the neck, chest, abdomen, and pelvis, but with similar De[place] 4 level of metabolic activity compared to the prior exam, compatible with active lymphoma. No bony involvement or splenomegaly identified. 2. 6 by 4 mm left upper lobe pulmonary nodule appears to be new and does not have hypermetabolic activity currently, surveillance suggested. 3. Several findings of indeterminate significance include some bandlike probable atelectasis or scarring medially in the right lower lobe with faintly accentuated activity, as well as some focal subcutaneous activity in stranding in the right lower quadrant. 4. Other  imaging findings of potential clinical significance: Aortic Atherosclerosis (ICD10-I70.0). Right renal cysts. Coronary atherosclerosis.   Electronically Signed   By: Van Clines M.D.   On: 11/30/2016 14:42  ASSESSMENT & PLAN:   No problem-specific Assessment & Plan notes found for this encounter.   No orders of the defined types were placed in this encounter.      Cammie Sickle, MD 01/11/2017 8:04 AM

## 2017-01-15 ENCOUNTER — Observation Stay: Payer: Medicare HMO

## 2017-01-15 ENCOUNTER — Emergency Department: Payer: Medicare HMO

## 2017-01-15 ENCOUNTER — Inpatient Hospital Stay
Admission: EM | Admit: 2017-01-15 | Discharge: 2017-01-18 | DRG: 640 | Disposition: A | Discharge status: Home Health | Payer: Medicare HMO | Attending: Internal Medicine | Admitting: Internal Medicine

## 2017-01-15 ENCOUNTER — Other Ambulatory Visit: Payer: Self-pay

## 2017-01-15 ENCOUNTER — Encounter: Payer: Self-pay | Admitting: Internal Medicine

## 2017-01-15 DIAGNOSIS — Z8673 Personal history of transient ischemic attack (TIA), and cerebral infarction without residual deficits: Secondary | ICD-10-CM

## 2017-01-15 DIAGNOSIS — R0602 Shortness of breath: Secondary | ICD-10-CM | POA: Diagnosis not present

## 2017-01-15 DIAGNOSIS — E8770 Fluid overload, unspecified: Secondary | ICD-10-CM | POA: Diagnosis not present

## 2017-01-15 DIAGNOSIS — Z7951 Long term (current) use of inhaled steroids: Secondary | ICD-10-CM

## 2017-01-15 DIAGNOSIS — D631 Anemia in chronic kidney disease: Secondary | ICD-10-CM | POA: Diagnosis present

## 2017-01-15 DIAGNOSIS — Z9841 Cataract extraction status, right eye: Secondary | ICD-10-CM

## 2017-01-15 DIAGNOSIS — Z809 Family history of malignant neoplasm, unspecified: Secondary | ICD-10-CM

## 2017-01-15 DIAGNOSIS — M1990 Unspecified osteoarthritis, unspecified site: Secondary | ICD-10-CM | POA: Diagnosis present

## 2017-01-15 DIAGNOSIS — E11649 Type 2 diabetes mellitus with hypoglycemia without coma: Secondary | ICD-10-CM | POA: Diagnosis present

## 2017-01-15 DIAGNOSIS — Z8551 Personal history of malignant neoplasm of bladder: Secondary | ICD-10-CM

## 2017-01-15 DIAGNOSIS — E1140 Type 2 diabetes mellitus with diabetic neuropathy, unspecified: Secondary | ICD-10-CM | POA: Diagnosis present

## 2017-01-15 DIAGNOSIS — K589 Irritable bowel syndrome without diarrhea: Secondary | ICD-10-CM | POA: Diagnosis present

## 2017-01-15 DIAGNOSIS — Z888 Allergy status to other drugs, medicaments and biological substances status: Secondary | ICD-10-CM

## 2017-01-15 DIAGNOSIS — J209 Acute bronchitis, unspecified: Secondary | ICD-10-CM | POA: Diagnosis not present

## 2017-01-15 DIAGNOSIS — J440 Chronic obstructive pulmonary disease with acute lower respiratory infection: Secondary | ICD-10-CM | POA: Diagnosis not present

## 2017-01-15 DIAGNOSIS — Z9842 Cataract extraction status, left eye: Secondary | ICD-10-CM

## 2017-01-15 DIAGNOSIS — K219 Gastro-esophageal reflux disease without esophagitis: Secondary | ICD-10-CM | POA: Diagnosis present

## 2017-01-15 DIAGNOSIS — Z7982 Long term (current) use of aspirin: Secondary | ICD-10-CM

## 2017-01-15 DIAGNOSIS — J441 Chronic obstructive pulmonary disease with (acute) exacerbation: Secondary | ICD-10-CM | POA: Diagnosis not present

## 2017-01-15 DIAGNOSIS — H9190 Unspecified hearing loss, unspecified ear: Secondary | ICD-10-CM | POA: Diagnosis present

## 2017-01-15 DIAGNOSIS — N2581 Secondary hyperparathyroidism of renal origin: Secondary | ICD-10-CM | POA: Diagnosis present

## 2017-01-15 DIAGNOSIS — J9601 Acute respiratory failure with hypoxia: Secondary | ICD-10-CM | POA: Diagnosis not present

## 2017-01-15 DIAGNOSIS — Z961 Presence of intraocular lens: Secondary | ICD-10-CM | POA: Diagnosis present

## 2017-01-15 DIAGNOSIS — Z87891 Personal history of nicotine dependence: Secondary | ICD-10-CM

## 2017-01-15 DIAGNOSIS — T380X5A Adverse effect of glucocorticoids and synthetic analogues, initial encounter: Secondary | ICD-10-CM | POA: Diagnosis present

## 2017-01-15 DIAGNOSIS — J810 Acute pulmonary edema: Secondary | ICD-10-CM

## 2017-01-15 DIAGNOSIS — Z794 Long term (current) use of insulin: Secondary | ICD-10-CM

## 2017-01-15 DIAGNOSIS — R600 Localized edema: Secondary | ICD-10-CM

## 2017-01-15 DIAGNOSIS — C9110 Chronic lymphocytic leukemia of B-cell type not having achieved remission: Secondary | ICD-10-CM | POA: Diagnosis present

## 2017-01-15 DIAGNOSIS — E1122 Type 2 diabetes mellitus with diabetic chronic kidney disease: Secondary | ICD-10-CM | POA: Diagnosis present

## 2017-01-15 DIAGNOSIS — Z992 Dependence on renal dialysis: Secondary | ICD-10-CM

## 2017-01-15 DIAGNOSIS — N186 End stage renal disease: Secondary | ICD-10-CM | POA: Diagnosis present

## 2017-01-15 DIAGNOSIS — Z91048 Other nonmedicinal substance allergy status: Secondary | ICD-10-CM

## 2017-01-15 LAB — RENAL FUNCTION PANEL
Albumin: 3.7 g/dL (ref 3.5–5.0)
Anion gap: 12 (ref 5–15)
BUN: 53 mg/dL — AB (ref 6–20)
CHLORIDE: 95 mmol/L — AB (ref 101–111)
CO2: 25 mmol/L (ref 22–32)
CREATININE: 7.8 mg/dL — AB (ref 0.61–1.24)
Calcium: 8.3 mg/dL — ABNORMAL LOW (ref 8.9–10.3)
GFR calc non Af Amer: 6 mL/min — ABNORMAL LOW (ref >60)
GFR, EST AFRICAN AMERICAN: 7 mL/min — AB (ref >60)
Glucose, Bld: 210 mg/dL — ABNORMAL HIGH (ref 65–99)
POTASSIUM: 4.3 mmol/L (ref 3.5–5.1)
Phosphorus: 4.8 mg/dL — ABNORMAL HIGH (ref 2.5–4.6)
Sodium: 132 mmol/L — ABNORMAL LOW (ref 135–145)

## 2017-01-15 LAB — TROPONIN I: Troponin I: <0.03 ng/mL (ref <0.03)

## 2017-01-15 LAB — CBC WITH DIFFERENTIAL/PLATELET
BASOS PCT: 1 %
Basophils Absolute: 0 10*3/uL (ref 0–0.1)
EOS PCT: 4 %
Eosinophils Absolute: 0.2 10*3/uL (ref 0–0.7)
HEMATOCRIT: 26.7 % — AB (ref 40.0–52.0)
Hemoglobin: 9.2 g/dL — ABNORMAL LOW (ref 13.0–18.0)
LYMPHS PCT: 48 %
Lymphs Abs: 2.9 10*3/uL (ref 1.0–3.6)
MCH: 34.4 pg — ABNORMAL HIGH (ref 26.0–34.0)
MCHC: 34.3 g/dL (ref 32.0–36.0)
MCV: 100.2 fL — AB (ref 80.0–100.0)
MONO ABS: 0.1 10*3/uL — AB (ref 0.2–1.0)
MONOS PCT: 1 %
NEUTROS ABS: 2.7 10*3/uL (ref 1.4–6.5)
Neutrophils Relative %: 46 %
PLATELETS: 148 10*3/uL — AB (ref 150–440)
RBC: 2.66 MIL/uL — ABNORMAL LOW (ref 4.40–5.90)
RDW: 16.1 % — AB (ref 11.5–14.5)
WBC: 5.9 10*3/uL (ref 3.8–10.6)

## 2017-01-15 LAB — BASIC METABOLIC PANEL
ANION GAP: 14 (ref 5–15)
BUN: 46 mg/dL — AB (ref 6–20)
CALCIUM: 8.5 mg/dL — AB (ref 8.9–10.3)
CO2: 23 mmol/L (ref 22–32)
Chloride: 94 mmol/L — ABNORMAL LOW (ref 101–111)
Creatinine, Ser: 7 mg/dL — ABNORMAL HIGH (ref 0.61–1.24)
GFR calc Af Amer: 8 mL/min — ABNORMAL LOW (ref >60)
GFR, EST NON AFRICAN AMERICAN: 7 mL/min — AB (ref >60)
GLUCOSE: 284 mg/dL — AB (ref 65–99)
Potassium: 4.6 mmol/L (ref 3.5–5.1)
Sodium: 131 mmol/L — ABNORMAL LOW (ref 135–145)

## 2017-01-15 LAB — CBC
HEMATOCRIT: 30.1 % — AB (ref 40.0–52.0)
Hemoglobin: 10.4 g/dL — ABNORMAL LOW (ref 13.0–18.0)
MCH: 34.5 pg — AB (ref 26.0–34.0)
MCHC: 34.7 g/dL (ref 32.0–36.0)
MCV: 99.3 fL (ref 80.0–100.0)
PLATELETS: 99 10*3/uL — AB (ref 150–440)
RBC: 3.03 MIL/uL — ABNORMAL LOW (ref 4.40–5.90)
RDW: 15.8 % — AB (ref 11.5–14.5)
WBC: 3.9 10*3/uL (ref 3.8–10.6)

## 2017-01-15 LAB — GLUCOSE, CAPILLARY
GLUCOSE-CAPILLARY: 169 mg/dL — AB (ref 65–99)
Glucose-Capillary: 142 mg/dL — ABNORMAL HIGH (ref 65–99)

## 2017-01-15 LAB — HEMOGLOBIN A1C
Hgb A1c MFr Bld: 5.9 % — ABNORMAL HIGH (ref 4.8–5.6)
MEAN PLASMA GLUCOSE: 122.63 mg/dL

## 2017-01-15 LAB — MRSA PCR SCREENING: MRSA by PCR: NEGATIVE

## 2017-01-15 MED ORDER — METOCLOPRAMIDE HCL 5 MG PO TABS: 5.0000 mg | ORAL_TABLET | Freq: Two times a day (BID) | ORAL | Status: DC

## 2017-01-15 MED ORDER — ASPIRIN 325 MG PO TABS
325.0000 mg | ORAL_TABLET | Freq: Every day | ORAL | Status: DC
  Administered 2017-01-15 – 2017-01-18 (×3): 325 mg via ORAL
  Filled 2017-01-15 (×4): qty 1

## 2017-01-15 MED ORDER — IBRUTINIB 280 MG PO TABS
1.0000 | ORAL_TABLET | Freq: Every day | ORAL | Status: DC
  Administered 2017-01-16 – 2017-01-17 (×2): 1 via ORAL
  Filled 2017-01-15 (×2): qty 1

## 2017-01-15 MED ORDER — SENNOSIDES-DOCUSATE SODIUM 8.6-50 MG PO TABS
1.0000 | ORAL_TABLET | Freq: Every evening | ORAL | Status: DC | PRN
  Administered 2017-01-15: 1 via ORAL
  Filled 2017-01-15: qty 1

## 2017-01-15 MED ORDER — SODIUM CHLORIDE 0.9% FLUSH
3.0000 mL | Freq: Two times a day (BID) | INTRAVENOUS | Status: DC
  Administered 2017-01-15 – 2017-01-18 (×5): 3 mL via INTRAVENOUS

## 2017-01-15 MED ORDER — PREDNISOLONE ACETATE 1 % OP SUSP
1.0000 [drp] | Freq: Four times a day (QID) | OPHTHALMIC | Status: DC
  Filled 2017-01-15: qty 1

## 2017-01-15 MED ORDER — FLUTICASONE PROPIONATE 50 MCG/ACT NA SUSP
1.0000 | Freq: Every day | NASAL | Status: DC | PRN
  Administered 2017-01-15 – 2017-01-18 (×3): 1 via NASAL
  Filled 2017-01-15: qty 16

## 2017-01-15 MED ORDER — NYSTATIN 100000 UNIT/GM EX CREA
1.0000 "application " | TOPICAL_CREAM | Freq: Two times a day (BID) | CUTANEOUS | Status: DC
  Administered 2017-01-15 – 2017-01-18 (×5): 1 via TOPICAL
  Filled 2017-01-15: qty 15

## 2017-01-15 MED ORDER — DIFLUPREDNATE 0.05 % OP EMUL
1.0000 [drp] | Freq: Two times a day (BID) | OPHTHALMIC | Status: DC
  Administered 2017-01-15 – 2017-01-18 (×5): 1 [drp] via OPHTHALMIC
  Filled 2017-01-15: qty 5

## 2017-01-15 MED ORDER — ACETAMINOPHEN 325 MG PO TABS
650.0000 mg | ORAL_TABLET | Freq: Four times a day (QID) | ORAL | Status: DC | PRN
  Administered 2017-01-15: 650 mg via ORAL

## 2017-01-15 MED ORDER — HEPARIN SODIUM (PORCINE) 5000 UNIT/ML IJ SOLN
5000.0000 [IU] | Freq: Three times a day (TID) | INTRAMUSCULAR | Status: DC
  Administered 2017-01-15 – 2017-01-16 (×5): 5000 [IU] via SUBCUTANEOUS
  Filled 2017-01-15 (×5): qty 1

## 2017-01-15 MED ORDER — AMLODIPINE BESYLATE 10 MG PO TABS
10.0000 mg | ORAL_TABLET | Freq: Every day | ORAL | Status: DC
  Administered 2017-01-16 – 2017-01-18 (×3): 10 mg via ORAL
  Filled 2017-01-15 (×3): qty 1

## 2017-01-15 MED ORDER — FUROSEMIDE 10 MG/ML IJ SOLN
40.0000 mg | Freq: Once | INTRAMUSCULAR | Status: AC
  Administered 2017-01-15: 40 mg via INTRAVENOUS
  Filled 2017-01-15: qty 4

## 2017-01-15 MED ORDER — METOCLOPRAMIDE HCL 5 MG PO TABS: 5.0000 mg | ORAL_TABLET | ORAL | Status: DC

## 2017-01-15 MED ORDER — CALCIUM ACETATE (PHOS BINDER) 667 MG PO CAPS
2001.0000 mg | ORAL_CAPSULE | Freq: Three times a day (TID) | ORAL | Status: DC
  Administered 2017-01-15 – 2017-01-18 (×7): 2001 mg via ORAL
  Filled 2017-01-15 (×8): qty 3

## 2017-01-15 MED ORDER — CALCITRIOL 0.25 MCG PO CAPS
0.2500 ug | ORAL_CAPSULE | Freq: Every day | ORAL | Status: DC
  Administered 2017-01-15 – 2017-01-18 (×3): 0.25 ug via ORAL
  Filled 2017-01-15 (×4): qty 1

## 2017-01-15 MED ORDER — CALCIUM ACETATE 667 MG PO CAPS: 1334.0000 mg | ORAL_CAPSULE | ORAL | Status: DC

## 2017-01-15 MED ORDER — CALCIUM ACETATE (PHOS BINDER) 667 MG PO CAPS
1334.0000 mg | ORAL_CAPSULE | Freq: Two times a day (BID) | ORAL | Status: DC
  Administered 2017-01-15 – 2017-01-18 (×5): 1334 mg via ORAL
  Filled 2017-01-15 (×5): qty 2

## 2017-01-15 MED ORDER — METOCLOPRAMIDE HCL 5 MG PO TABS
5.0000 mg | ORAL_TABLET | ORAL | Status: DC
  Administered 2017-01-16 (×2): 5 mg via ORAL
  Filled 2017-01-15 (×2): qty 1

## 2017-01-15 MED ORDER — NEOMYCIN-POLYMYXIN-DEXAMETH 3.5-10000-0.1 OP OINT
1.0000 "application " | TOPICAL_OINTMENT | Freq: Two times a day (BID) | OPHTHALMIC | Status: DC
  Administered 2017-01-15: 1 via OPHTHALMIC
  Filled 2017-01-15: qty 3.5

## 2017-01-15 MED ORDER — CALCIUM ACETATE (PHOS BINDER) 667 MG PO CAPS: 1334.0000 mg | ORAL_CAPSULE | Freq: Two times a day (BID) | ORAL | Status: DC

## 2017-01-15 MED ORDER — INSULIN ASPART 100 UNIT/ML ~~LOC~~ SOLN
0.0000 [IU] | Freq: Three times a day (TID) | SUBCUTANEOUS | Status: DC
  Administered 2017-01-15: 1 [IU] via SUBCUTANEOUS
  Administered 2017-01-16: 2 [IU] via SUBCUTANEOUS
  Administered 2017-01-16: 9 [IU] via SUBCUTANEOUS
  Administered 2017-01-17: 7 [IU] via SUBCUTANEOUS
  Filled 2017-01-15 (×5): qty 1

## 2017-01-15 MED ORDER — ONDANSETRON HCL 4 MG PO TABS: 4.0000 mg | ORAL_TABLET | Freq: Four times a day (QID) | ORAL | Status: DC | PRN

## 2017-01-15 MED ORDER — METHYLPHENIDATE HCL 5 MG PO TABS
5.0000 mg | ORAL_TABLET | Freq: Every day | ORAL | Status: DC
  Administered 2017-01-17 – 2017-01-18 (×2): 5 mg via ORAL
  Filled 2017-01-15 (×3): qty 1

## 2017-01-15 MED ORDER — METOPROLOL TARTRATE 25 MG PO TABS: 25.0000 mg | ORAL_TABLET | ORAL | Status: DC

## 2017-01-15 MED ORDER — PRAVASTATIN SODIUM 20 MG PO TABS
10.0000 mg | ORAL_TABLET | Freq: Every day | ORAL | Status: DC
  Administered 2017-01-15 – 2017-01-17 (×3): 10 mg via ORAL
  Filled 2017-01-15 (×3): qty 1

## 2017-01-15 MED ORDER — GANCICLOVIR 0.15 % OP GEL
1.0000 "application " | Freq: Three times a day (TID) | OPHTHALMIC | Status: DC
  Administered 2017-01-15: 1 [drp] via OPHTHALMIC
  Filled 2017-01-15: qty 5

## 2017-01-15 MED ORDER — INSULIN DETEMIR 100 UNIT/ML ~~LOC~~ SOLN
20.0000 [IU] | Freq: Every day | SUBCUTANEOUS | Status: DC
  Administered 2017-01-15: 20 [IU] via SUBCUTANEOUS
  Filled 2017-01-15 (×2): qty 0.2

## 2017-01-15 MED ORDER — ACYCLOVIR 400 MG PO TABS
400.0000 mg | ORAL_TABLET | Freq: Every morning | ORAL | Status: DC
  Administered 2017-01-17: 400 mg via ORAL
  Filled 2017-01-15 (×4): qty 1

## 2017-01-15 MED ORDER — ONDANSETRON HCL 4 MG/2ML IJ SOLN: 4.0000 mg | Freq: Four times a day (QID) | INTRAMUSCULAR | Status: DC | PRN

## 2017-01-15 MED ORDER — METOPROLOL TARTRATE 25 MG PO TABS
25.0000 mg | ORAL_TABLET | ORAL | Status: DC
  Administered 2017-01-16: 25 mg via ORAL
  Filled 2017-01-15 (×3): qty 1

## 2017-01-15 MED ORDER — SODIUM CHLORIDE 0.9 % IV SOLN: 250.0000 mL | INTRAVENOUS | Status: DC | PRN

## 2017-01-15 MED ORDER — GABAPENTIN 100 MG PO CAPS
100.0000 mg | ORAL_CAPSULE | Freq: Three times a day (TID) | ORAL | Status: DC
  Administered 2017-01-15 – 2017-01-18 (×9): 100 mg via ORAL
  Filled 2017-01-15 (×9): qty 1

## 2017-01-15 MED ORDER — SODIUM CHLORIDE 0.9% FLUSH: 3.0000 mL | INTRAVENOUS | Status: DC | PRN

## 2017-01-15 MED ORDER — CALCIUM ACETATE (PHOS BINDER) 667 MG PO CAPS
2001.0000 mg | ORAL_CAPSULE | Freq: Three times a day (TID) | ORAL | Status: DC
  Administered 2017-01-15: 2001 mg via ORAL
  Filled 2017-01-15: qty 3

## 2017-01-15 MED ORDER — METOCLOPRAMIDE HCL 5 MG PO TABS
5.0000 mg | ORAL_TABLET | ORAL | Status: DC
  Filled 2017-01-15: qty 1

## 2017-01-15 MED ORDER — DIFLUPREDNATE 0.05 % OP EMUL: 1.0000 [drp] | Freq: Four times a day (QID) | OPHTHALMIC | Status: DC

## 2017-01-15 MED ORDER — PREDNISOLONE ACETATE 1 % OP SUSP
1.0000 [drp] | Freq: Four times a day (QID) | OPHTHALMIC | Status: DC
  Administered 2017-01-15: 1 [drp] via OPHTHALMIC
  Filled 2017-01-15: qty 1

## 2017-01-15 MED ORDER — GLIMEPIRIDE 4 MG PO TABS
8.0000 mg | ORAL_TABLET | Freq: Every day | ORAL | Status: DC
  Filled 2017-01-15: qty 2

## 2017-01-15 MED ORDER — RENA-VITE PO TABS
1.0000 | ORAL_TABLET | Freq: Every day | ORAL | Status: DC
  Administered 2017-01-16: 1 via ORAL
  Filled 2017-01-15 (×4): qty 1

## 2017-01-15 MED ORDER — SILVER SULFADIAZINE 1 % EX CREA
1.0000 "application " | TOPICAL_CREAM | Freq: Every day | CUTANEOUS | Status: DC
  Administered 2017-01-15 – 2017-01-18 (×3): 1 via TOPICAL
  Filled 2017-01-15: qty 85

## 2017-01-15 MED ORDER — INSULIN DETEMIR 100 UNIT/ML ~~LOC~~ SOLN
25.0000 [IU] | Freq: Every day | SUBCUTANEOUS | Status: DC
  Filled 2017-01-15: qty 0.3

## 2017-01-15 MED ORDER — FERROUS SULFATE 325 (65 FE) MG PO TABS
325.0000 mg | ORAL_TABLET | Freq: Every day | ORAL | Status: DC
  Administered 2017-01-15 – 2017-01-18 (×4): 325 mg via ORAL
  Filled 2017-01-15 (×4): qty 1

## 2017-01-15 MED ORDER — ACETAMINOPHEN 650 MG RE SUPP: 650.0000 mg | Freq: Four times a day (QID) | RECTAL | Status: DC | PRN

## 2017-01-15 MED ORDER — METOCLOPRAMIDE HCL 5 MG PO TABS
5.0000 mg | ORAL_TABLET | ORAL | Status: DC
  Administered 2017-01-15: 5 mg via ORAL
  Filled 2017-01-15: qty 1

## 2017-01-15 MED ORDER — IBRUTINIB 280 MG PO TABS
1.0000 | ORAL_TABLET | Freq: Every day | ORAL | Status: DC
  Administered 2017-01-15: 1 via ORAL
  Filled 2017-01-15: qty 1

## 2017-01-15 MED ORDER — GANCICLOVIR 0.15 % OP GEL
1.0000 "application " | Freq: Every day | OPHTHALMIC | Status: DC
  Administered 2017-01-16 – 2017-01-17 (×2): 1 [drp] via OPHTHALMIC

## 2017-01-15 NOTE — Progress Notes (Signed)
Hd tX STARTED

## 2017-01-15 NOTE — ED Triage Notes (Signed)
Patient to rm 13 via EMS from home for sudden onset of shortness of breath.  EMS reports initial room air sats were 87%.  Reports wheezing on right side.  Patient received duonebs x 3 by EMS.  VS:  Hr 65: bp 190 74; end tidal 35-36 and cbg 259.

## 2017-01-15 NOTE — Progress Notes (Signed)
[facility]  ROUNDING NOTE   Subjective:   Mark [name]. admitted to [facility] on 01/15/2017 for sob   Seen and examined on hemodialysis. Tolerating treatment well. UF goal of 2.5 liters.   Did have some cramping.   Objective:  Vital signs in last 24 hours:  Temp:  [98.1 F (36.7 C)-98.2 F (36.8 C)] 98.1 F (36.7 C) (10/15 1024) Pulse Rate:  [56-78] 70 (10/15 1330) Resp:  [11-28] 12 (10/15 1330) BP: (151-197)/(49-68) 155/65 (10/15 1330) SpO2:  [87 %-99 %] 88 % (10/15 1330) Weight:  [94.9 kg (209 lb 3.5 oz)-94.9 kg (209 lb 4.8 oz)] 94.9 kg (209 lb 3.5 oz) (10/15 1024)  Weight change:  Filed Weights   01/15/17 0435 01/15/17 1024  Weight: 94.9 kg (209 lb 4.8 oz) 94.9 kg (209 lb 3.5 oz)    Intake/Output: No intake/output data recorded.   Intake/Output this shift:  Total I/O In: 240 [P.O.:240] Out: -   Physical Exam: General: NAD, laying in bed  Head: Normocephalic, atraumatic. Moist oral mucosal membranes  Eyes: Anicteric, PERRL  Neck: Supple, trachea midline  Lungs:  wheezing  Heart: Regular rate and rhythm  Abdomen:  Soft, nontender,   Extremities:  no peripheral edema.  Neurologic: Nonfocal, moving all four extremities  Skin: No lesions  Access: Left AVF    Basic Metabolic Panel:  Recent Labs Lab 01/15/17 0045 01/15/17 1000  NA 131* 132*  K 4.6 4.3  CL 94* 95*  CO2 23 25  GLUCOSE 284* 210*  BUN 46* 53*  CREATININE 7.00* 7.80*  CALCIUM 8.5* 8.3*  PHOS  --  4.8*    Liver Function Tests:  Recent Labs Lab 01/15/17 1000  ALBUMIN 3.7   No results for input(s): LIPASE, AMYLASE in the last 168 hours. No results for input(s): AMMONIA in the last 168 hours.  CBC:  Recent Labs Lab 01/15/17 0045 01/15/17 1000  WBC 5.9 3.9  NEUTROABS 2.7  --   HGB 9.2* 10.4*  HCT 26.7* 30.1*  MCV 100.2* 99.3  PLT 148* 99*    Cardiac Enzymes:  Recent Labs Lab 01/15/17 0045  TROPONINI <0.03    BNP: Invalid input(s):  POCBNP  CBG: No results for input(s): GLUCAP in the last 168 hours.  Microbiology: Results for orders placed or performed during the hospital encounter of 01/15/17  MRSA PCR Screening     Status: None   Collection Time: 01/15/17  4:42 AM  Result Value Ref Range Status   MRSA by PCR NEGATIVE NEGATIVE Final    Comment:        The GeneXpert MRSA Assay (FDA approved for NASAL specimens only), is one component of a comprehensive MRSA colonization surveillance program. It is not intended to diagnose MRSA infection nor to guide or monitor treatment for MRSA infections.     Coagulation Studies: No results for input(s): LABPROT, INR in the last 72 hours.  Urinalysis: No results for input(s): COLORURINE, LABSPEC, PHURINE, GLUCOSEU, HGBUR, BILIRUBINUR, KETONESUR, PROTEINUR, UROBILINOGEN, NITRITE, LEUKOCYTESUR in the last 72 hours.  Invalid input(s): APPERANCEUR    Imaging: Dg Chest 2 View  Result Date: 01/15/2017 CLINICAL DATA:  Acute onset shortness of breath. EXAM: CHEST  2 VIEW COMPARISON:  Chest 10/20/2016.  CT chest 11/30/2016. FINDINGS: Shallow inspiration. Cardiac enlargement. Prominent right and left paratracheal stripe likely corresponding to known mediastinal lymphadenopathy. There is new perihilar infiltration, greater on the left. This may be due to edema or pneumonia. Blunting of the costophrenic angles suggesting small pleural  effusions. Small amount of fluid in the fissures. No pneumothorax. Calcification of the aorta. IMPRESSION: 1. Prominent paratracheal soft tissue corresponding to known mediastinal lymphadenopathy. 2. Mild cardiac enlargement. 3. Perihilar infiltrates, greater on the left, with small bilateral pleural effusions. Changes may represent edema or pneumonia. 4. Aortic atherosclerosis. Electronically Signed   By: Lucienne Capers M.D.   On: 01/15/2017 01:16     Medications:   . sodium chloride     . amLODipine  10 mg Oral Daily  . aspirin  325 mg Oral  Daily  . calcitRIOL  0.25 mcg Oral Daily  . calcium acetate  1,334 mg Oral BID BM   And  . calcium acetate  2,001 mg Oral TID WC  . Difluprednate  1 drop Left Eye QID  . ferrous sulfate  325 mg Oral Q breakfast  . gabapentin  100 mg Oral TID  . Ganciclovir  1 application Left Eye TID  . glimepiride  8 mg Oral QHS  . heparin  5,000 Units Subcutaneous Q8H  . Ibrutinib  1 capsule Oral Daily  . insulin detemir  25-30 Units Subcutaneous Q2200  . methylphenidate  5 mg Oral Daily  . [START ON 01/16/2017] metoCLOPramide  5 mg Oral 2 times per day on Sun Tue Thu Sat   And  . [START ON 01/17/2017] metoCLOPramide  5 mg Oral Q M,W,F  . [START ON 01/20/2017] metoprolol tartrate  25 mg Oral Q Sat   And  . [START ON 01/20/2017] metoprolol tartrate  25 mg Oral Q Sat  . [START ON 01/19/2017] metoprolol tartrate  25 mg Oral Q Fri   And  . [START ON 01/16/2017] metoprolol tartrate  25 mg Oral Q Tue  . multivitamin  1 tablet Oral QHS  . neomycin-polymyxin b-dexamethasone  1 application Left Eye BID  . nystatin cream  1 application Topical BID  . pravastatin  10 mg Oral q1800  . prednisoLONE acetate  1 drop Left Eye QID  . silver sulfADIAZINE  1 application Topical Daily  . sodium chloride flush  3 mL Intravenous Q12H   sodium chloride, acetaminophen **OR** acetaminophen, fluticasone, ondansetron **OR** ondansetron (ZOFRAN) IV, senna-docusate, sodium chloride flush  Assessment/ Plan:  [name]. is a 71 y.o. black male with end stage renal disease on hemodialysis, CLL, diabetes mellitus type II, history of bladder cancer, TIA  [facility] MWF [facility] AVF  1. End Stage Renal Disease: with concern for volume overload but with cramping when try to pull extra UF.  EDW 94Kg  - Seen and examined on hemodialysis. Continue MWF schedule.   2. Hypertension: blood pressure elevated.  - amlodipine and metoprolol.   3. Anemia of chronic kidney disease: with history of CLL on  ibrutinib - EPO with treatment.   4. Secondary Hyperparathyroidism: with hyperphosphatemia: PTh 422, calcium 8.1 and phosphorus 5.9 - PO calcitriol - calcium acetate with meals.    LOS: 0 Khallid Pasillas 10/15/20181:36 PM

## 2017-01-15 NOTE — Progress Notes (Signed)
PRE HD   

## 2017-01-15 NOTE — Progress Notes (Signed)
Inpatient Diabetes Program Recommendations  AACE/ADA: New Consensus Statement on Inpatient Glycemic Control (2015)  Target Ranges:  Prepandial:   less than 140 mg/dL      Peak postprandial:   less than 180 mg/dL (1-2 hours)      Critically ill patients:  140 - 180 mg/dL   Results for KENNETT, SYMES (MRN [id number]) as of 01/15/2017 09:13  Ref. Range 01/15/2017 00:45  Glucose Latest Ref Range: 65 - 99 mg/dL 284 (H)   Admit with: SOB  History: DM  Home DM Meds: Levemir 25-30 units QHS       Amaryl 8 mg QHS  Current Insulin Orders: Levemir 25-30 units QHS      Amaryl 8 mg QHS      MD- Please consider the following in-hospital insulin adjustments:  1. Stop Amaryl for now  2. Start Novolog Moderate Correction Scale/ SSI (0-15 units) TID AC + HS  3. Change Levemir orders to 20 units QHS to start (0.2 units/kg dosing)      --Will follow patient during hospitalization--  Wyn Quaker RN, MSN, CDE Diabetes Coordinator Inpatient Glycemic Control Team Team Pager: [phone] (8a-5p)

## 2017-01-15 NOTE — ED Provider Notes (Signed)
[facility] Emergency Department Provider Note   ____________________________________________   I have reviewed the triage vital signs and the nursing notes.   HISTORY  Chief Complaint Shortness of Breath   History limited by: Not Limited   HPI Mark [name]. is a 71 y.o. male who presents to the emergency department today because of shortness of breath.  DURATION:roughly 1 hour TIMING: started suddenly SEVERITY: moderate CONTEXT: patient was at his home when it started. Has history of dialysis. Had full treatment 2 days ago as scheduled.  MODIFYING FACTORS: worse with lying flat. Better with ambulation. ASSOCIATED SYMPTOMS: denies chest pain. Had non bloody cough. Denies any swelling. Denies any fevers.  Per medical record review patient has a history of dialysis.   Past Medical History:  Diagnosis Date  . Anemia   . Arthritis   . Bladder cancer ([place])   . Chronic kidney disease   . Diabetes mellitus without complication ([place])   . Dialysis patient [facility])    M,W,F  . Dyspnea    DOE  . GERD (gastroesophageal reflux disease)   . HOH (hard of hearing)   . Hypertension   . IBS (irritable bowel syndrome)   . Lymphoma ([place]) 09/27/2014  . Neuropathy    RIGHT LEG  . Stroke [facility])    TIA    Patient Active Problem List   Diagnosis Date Noted  . UTI (urinary tract infection) 10/20/2016  . Sepsis ([place]) 10/20/2016  . 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 Ulcer of foot, chronic ([place]) 03/21/2015  . History of bladder cancer 11/10/2014  . Penile bleeding 10/12/2014  . Dependence on renal dialysis ([place]) 08/18/2013  . Arterial blood pressure decreased 08/18/2013  . Leukemia ([place]) 08/18/2013  . Retina disorder 08/18/2013  . Breath shortness 08/18/2013  . End-stage renal disease on hemodialysis ([place]) 12/23/2012  . Absolute anemia 07/18/2012  . HPTH  (hyperparathyroidism) ([place]) 07/18/2012  . Acidosis, metabolic 64/33/2951  . Hyperparathyroidism ([place]) 07/18/2012  . Abnormal presence of protein in urine 01/01/2012  . HLD (hyperlipidemia) 12/25/2011  . Diabetic retinopathy associated with type 2 diabetes mellitus ([place]) 10/26/1999  . Essential (primary) hypertension 04/04/1999  . Diabetes mellitus type 2, uncontrolled ([place]) 04/04/1999  . Type 2 diabetes mellitus with other diabetic kidney complication ([place]) 88/41/6606    Past Surgical History:  Procedure Laterality Date  . BACK SURGERY  2007  . CATARACT EXTRACTION W/PHACO Left 03/23/2016   Procedure: CATARACT EXTRACTION PHACO AND INTRAOCULAR LENS PLACEMENT (IOC);  Surgeon: Leandrew Koyanagi, MD;  Location: [facility] ORS;  Service: Ophthalmology;  Laterality: Left;  [place] 52.6 AP% 14.7 CDE 7.72 Fluid pack lot # [id number]  . CATARACT EXTRACTION W/PHACO Right 05/30/2016   Procedure: CATARACT EXTRACTION PHACO AND INTRAOCULAR LENS PLACEMENT ([place]);  Surgeon: Leandrew Koyanagi, MD;  Location: [facility] ORS;  Service: Ophthalmology;  Laterality: Right;  [place] 01:08 AP% 13.9 CDE 9.53  note: could not get IV in patient, so procedure done without anesthesia personell present, ok per Dr Wallace Going fluid pack lo t# [id number]  . CIRCUMCISION    . PERIPHERAL VASCULAR CATHETERIZATION Left 08/19/2015   Procedure: A/V Shuntogram/Fistulagram;  Surgeon: Algernon Huxley, MD;  Location: [place] CV LAB;  Service: Cardiovascular;  Laterality: Left;  . PERIPHERAL VASCULAR CATHETERIZATION N/A 08/19/2015   Procedure: A/V Shunt Intervention;  Surgeon: Algernon Huxley, MD;  Location: [place] CV LAB;  Service: Cardiovascular;  Laterality: N/A;    Prior  to Admission medications   Medication Sig Start Date End Date Taking? Authorizing Provider  acyclovir (ZOVIRAX) 400 MG tablet One pill a day [to prevent shingles]; Take it after dialysis on the days of dialyisis 12/28/16   Cammie Sickle, MD  amLODipine (NORVASC) 10 MG  tablet Take 10 mg by mouth daily.  06/02/13   [provider]  aspirin 325 MG tablet Take 325 mg by mouth daily.    [provider]  CALCITRIOL PO Take 1 tablet by mouth daily.    [provider]  calcium acetate (PHOSLO) 667 MG capsule Take 1,334-2,001 mg by mouth See admin instructions. 3 caps with meals and 2 caps with snacks    [provider]  Difluprednate (DUREZOL) 0.05 % EMUL Place 1 drop into the left eye 4 (four) times daily.    [provider]  ENSURE PLUS (ENSURE PLUS) LIQD Take by mouth 3 (three) times a week. M,W.F 01/26/14   [provider]  Epoetin Alfa (EPOGEN IJ) Inject 4,800 Units as directed every 21 ( twenty-one) days.    [provider]  ferrous sulfate 325 (65 FE) MG EC tablet Take 325 mg by mouth daily with breakfast.    [provider]  fluticasone (FLONASE) 50 MCG/ACT nasal spray Place 1 spray into both nostrils daily as needed for allergies.  12/14/14   [provider]  furosemide (LASIX) 80 MG tablet Take 80-160 mg by mouth See admin instructions. 2 tablets in am- on sat, Sunday, Tuesday and Thursday 1 tablet in PM- on Sundays -Saturdays    [provider]  gabapentin (NEURONTIN) 100 MG capsule Take 1 capsule (100 mg total) by mouth 3 (three) times daily. Patient taking differently: Take 200-300 mg by mouth 2 (two) times daily.  03/02/16   Edrick Kins, DPM  glimepiride (AMARYL) 4 MG tablet Take 8 mg by mouth at bedtime.  10/06/14   [provider]  Ibrutinib 280 MG TABS Take 1 capsule by mouth daily. 11/20/16   Cammie Sickle, MD  LEVEMIR FLEXTOUCH 100 UNIT/ML Pen Inject 25-30 Units into the skin daily at 10 pm. Depending on what he eats 08/21/14   [provider]  lovastatin (MEVACOR) 20 MG tablet Take 1 tablet by mouth daily. 12/25/16   [provider]  methylphenidate (RITALIN) 5 MG tablet Take 1 tablet by mouth daily. 11/27/16   [provider]  metoCLOPramide (REGLAN) 5 MG tablet Take 5 mg by mouth 2 (two) times daily. 1 TAB M,W,F 1TAB BID T,T,S,S    [provider]  metoprolol tartrate (LOPRESSOR) 100 MG tablet Take 25 mg by mouth See admin instructions. Pt takes Tues morning, Friday Night, and Saturday morning and evening    [provider]  multivitamin (RENA-VIT) TABS tablet Take 1 tablet by mouth at bedtime.     [provider]  neomycin-polymyxin b-dexamethasone (MAXITROL) 3.5-10000-0.1 OINT Place 1 application into the left eye 2 (two) times daily.  08/16/16   [provider]  nystatin cream (MYCOSTATIN) Apply 1 application topically 2 (two) times daily.    [provider]  prednisoLONE acetate (PRED FORTE) 1 % ophthalmic suspension Place 1 drop into the left eye 4 (four) times daily. 10/03/16   [provider]  silver sulfADIAZINE (SILVADENE) 1 % cream Apply 1 application topically daily. 07/04/16   Edrick Kins, DPM  ZIRGAN 0.15 % GEL Place 1 application into the left eye 3 (three) times daily as needed. 12/05/16  [provider]    Allergies Ibuprofen; Multivitamin [centrum]; Daypro [oxaprozin]; and Tape  Family History  Problem Relation Age of Onset  . Cancer Mother   . Kidney cancer Neg Hx   . Prostate cancer Neg Hx   . Kidney failure Neg Hx   . Bladder Cancer Neg Hx     Social History Social History  Substance Use Topics  . Smoking status: Former Smoker    Types: Cigarettes    Quit date: 05/19/2013  . Smokeless tobacco: Never Used     Comment: quit  . Alcohol use No    Review of Systems Constitutional: No fever/chills Eyes: No visual changes. ENT: No sore throat. Cardiovascular: Denies chest pain. Respiratory: Positive for shortness of breath. Gastrointestinal: No abdominal pain.  No nausea, no vomiting.  No diarrhea.   Genitourinary: Negative for dysuria. Musculoskeletal: Negative for back pain. Skin: Negative for rash. Neurological:  Negative for headaches, focal weakness or numbness.  ____________________________________________   PHYSICAL EXAM:  VITAL SIGNS: ED Triage Vitals [01/15/17 0042]  Enc Vitals Group     BP (!) 197/65     Pulse Rate 68     Resp (!) 28     Temp      Temp src      SpO2 (!) 88 %   Constitutional: Alert and oriented. Well appearing and in no distress. Eyes: Conjunctivae are normal.  ENT   Head: Normocephalic and atraumatic.   Nose: No congestion/rhinnorhea.   Mouth/Throat: Mucous membranes are moist.   Neck: No stridor. Hematological/Lymphatic/Immunilogical: No cervical lymphadenopathy. Cardiovascular: Normal rate, regular rhythm.  No murmurs, rubs, or gallops. Dialysis fistula noted in left forearm.  Respiratory: Normal respiratory effort without tachypnea nor retractions. Breath sounds are clear and equal bilaterally. No wheezes/rales/rhonchi. Gastrointestinal: Soft and non tender. No rebound. No guarding.  Genitourinary: Deferred Musculoskeletal: Normal range of motion in all extremities. No lower extremity edema. Neurologic:  Normal speech and language. No gross focal neurologic deficits are appreciated.  Skin:  Skin is warm, dry and intact. No rash noted. Psychiatric: Mood and affect are normal. Speech and behavior are normal. Patient exhibits appropriate insight and judgment.  ____________________________________________    LABS (pertinent positives/negatives)  Trop <0.03 CBC hgb 9.2 BMP na 131, glu 284, cr 7.0 ____________________________________________   EKG  I, Nance Pear, attending physician, personally viewed and interpreted this EKG  EKG Time: 0038 Rate: 79 Rhythm: normal sinus rhythm Axis: normal Intervals: qtc 466 QRS: narrow, q waves v1 ST changes: no st elevation, st depression v3 Impression: abnormal ekg   ____________________________________________    RADIOLOGY  CXR Bilateral  infiltrates  ____________________________________________   PROCEDURES  Procedures  ____________________________________________   INITIAL IMPRESSION / ASSESSMENT AND PLAN / ED COURSE  Pertinent labs & imaging results that were available during my care of the patient were reviewed by me and considered in my medical decision making (see chart for details).  Patient presents with SOB. ddx would include pneumonia, pneumothorax, pulmonary edema, cardiac disease, anemia amongst other etiologies. CXR did show bilateral infiltrates. At this point I think most likely represents edema. No other signs of infection, patient is afebrile, no leukocytosis. Given that he still makes some urine he was given lasix. Will plan on admission given hypoxia.   Discussed with patient and family results of testing.  ____________________________________________   FINAL CLINICAL IMPRESSION(S) / ED DIAGNOSES  Final diagnoses:  Shortness of breath  Acute pulmonary edema ([place])     Note: This dictation was prepared  with Sales executive. Any transcriptional errors that result from this process are unintentional     Nance Pear, MD 01/15/17 [phone]

## 2017-01-15 NOTE — H&P (Signed)
[facility] NAME: Mark [name]    MR#:  [id number]  DATE OF BIRTH:  31-Dec-1945  DATE OF ADMISSION:  01/15/2017  PRIMARY CARE PHYSICIAN: Marden Noble, MD   REQUESTING/REFERRING PHYSICIAN:   CHIEF COMPLAINT:   Chief Complaint  Patient presents with  . Shortness of Breath    HISTORY OF PRESENT ILLNESS: Mark [name]  is a 71 y.o. male with a known history of End-stage renal disease on dialysis, diabetes mellitus type 2, arthritis, chronic anemia, GERD, hypertension, lymphoma, neuropathy presented to the emergency room with increased shortness of breath. Patient O2 sats on room air were 87%. Patient does not use any home oxygen. Patient gets dialyzed on Monday venison Friday. He did not miss any dialysis. Patient has noticed increased swelling in the legs from fluid retention for the last couple of days. Chest x-ray revealed interstitial edema with fluid overload. Hospitalist service was consulted. Patient still makes urine and IV Lasix was given in the emergency room for diuresis.  PAST MEDICAL HISTORY:   Past Medical History:  Diagnosis Date  . Anemia   . Arthritis   . Bladder cancer ([place])   . Chronic kidney disease   . Diabetes mellitus without complication ([place])   . Dialysis patient [facility])    M,W,F  . Dyspnea    DOE  . GERD (gastroesophageal reflux disease)   . HOH (hard of hearing)   . Hypertension   . IBS (irritable bowel syndrome)   . Lymphoma ([place]) 09/27/2014  . Neuropathy    RIGHT LEG  . Stroke [facility])    TIA    PAST SURGICAL HISTORY: Past Surgical History:  Procedure Laterality Date  . BACK SURGERY  2007  . CATARACT EXTRACTION W/PHACO Left 03/23/2016   Procedure: CATARACT EXTRACTION PHACO AND INTRAOCULAR LENS PLACEMENT (IOC);  Surgeon: Leandrew Koyanagi, MD;  Location: [facility] ORS;  Service: Ophthalmology;  Laterality: Left;  [place] 52.6 AP% 14.7 CDE 7.72 Fluid pack lot # [id number]  . CATARACT EXTRACTION  W/PHACO Right 05/30/2016   Procedure: CATARACT EXTRACTION PHACO AND INTRAOCULAR LENS PLACEMENT ([place]);  Surgeon: Leandrew Koyanagi, MD;  Location: [facility] ORS;  Service: Ophthalmology;  Laterality: Right;  [place] 01:08 AP% 13.9 CDE 9.53  note: could not get IV in patient, so procedure done without anesthesia personell present, ok per Dr Wallace Going fluid pack lo t# [id number]  . CIRCUMCISION    . PERIPHERAL VASCULAR CATHETERIZATION Left 08/19/2015   Procedure: A/V Shuntogram/Fistulagram;  Surgeon: Algernon Huxley, MD;  Location: [place] CV LAB;  Service: Cardiovascular;  Laterality: Left;  . PERIPHERAL VASCULAR CATHETERIZATION N/A 08/19/2015   Procedure: A/V Shunt Intervention;  Surgeon: Algernon Huxley, MD;  Location: [place] CV LAB;  Service: Cardiovascular;  Laterality: N/A;    SOCIAL HISTORY:  Social History  Substance Use Topics  . Smoking status: Former Smoker    Types: Cigarettes    Quit date: 05/19/2013  . Smokeless tobacco: Never Used     Comment: quit  . Alcohol use No    FAMILY HISTORY:  Family History  Problem Relation Age of Onset  . Cancer Mother   . Kidney cancer Neg Hx   . Prostate cancer Neg Hx   . Kidney failure Neg Hx   . Bladder Cancer Neg Hx     DRUG ALLERGIES:  Allergies  Allergen Reactions  . Ibuprofen     DUE TO DIALYSIS  . Multivitamin [Centrum]     DUE TO  DIALYSIS  . Daypro [Oxaprozin] Swelling and Rash    Other reaction(s): Other (See Comments)  . Tape Rash    REVIEW OF SYSTEMS:   CONSTITUTIONAL: No fever, fatigue or weakness.  EYES: No blurred or double vision.  EARS, NOSE, AND THROAT: No tinnitus or ear pain.  RESPIRATORY: No cough, has shortness of breath,  Scattered wheezing  No hemoptysis.  CARDIOVASCULAR: No chest pain, orthopnea,  Has edema.  GASTROINTESTINAL: No nausea, vomiting, diarrhea or abdominal pain.  GENITOURINARY: No dysuria, hematuria.  ENDOCRINE: No polyuria, nocturia,  HEMATOLOGY: No anemia, easy bruising or  bleeding SKIN: No rash or lesion. MUSCULOSKELETAL: No joint pain or arthritis.   NEUROLOGIC: No tingling, numbness, weakness.  PSYCHIATRY: No anxiety or depression.   MEDICATIONS AT HOME:  Prior to Admission medications   Medication Sig Start Date End Date Taking? Authorizing Provider  acyclovir (ZOVIRAX) 400 MG tablet One pill a day [to prevent shingles]; Take it after dialysis on the days of dialyisis 12/28/16  Yes Cammie Sickle, MD  amLODipine (NORVASC) 10 MG tablet Take 10 mg by mouth daily.  06/02/13  Yes [provider]  aspirin 81 MG EC tablet Take 81 mg by mouth daily.    Yes [provider]  CALCITRIOL PO Take 1 tablet by mouth daily.   Yes [provider]  calcium acetate (PHOSLO) 667 MG capsule Take 1,334-2,001 mg by mouth See admin instructions. 3 caps with meals and 2 caps with snacks   Yes [provider]  Difluprednate (DUREZOL) 0.05 % EMUL Place 1 drop into the left eye 4 (four) times daily.   Yes [provider]  ENSURE PLUS (ENSURE PLUS) LIQD Take by mouth 3 (three) times a week. M,W.F 01/26/14  Yes [provider]  Epoetin Alfa (EPOGEN IJ) Inject 4,800 Units as directed every 21 ( twenty-one) days.   Yes [provider]  ferrous sulfate 325 (65 FE) MG EC tablet Take 325 mg by mouth daily with breakfast.   Yes [provider]  fluticasone (FLONASE) 50 MCG/ACT nasal spray Place 1 spray into both nostrils daily as needed for allergies.  12/14/14  Yes [provider]  furosemide (LASIX) 80 MG tablet Take 80-160 mg by mouth See admin instructions. 2 tablets in am- on sat, Sunday, Tuesday and Thursday 1 tablet in PM- on Sundays -Saturdays   Yes [provider]  gabapentin (NEURONTIN) 100 MG capsule Take 1 capsule (100 mg total) by mouth 3 (three) times daily. Patient taking differently: Take 200-300 mg by mouth 2 (two) times daily.  03/02/16  Yes Edrick Kins, DPM  glimepiride (AMARYL)  4 MG tablet Take 8 mg by mouth at bedtime.  10/06/14  Yes [provider]  Ibrutinib 280 MG TABS Take 1 capsule by mouth daily. 11/20/16  Yes Cammie Sickle, MD  LEVEMIR FLEXTOUCH 100 UNIT/ML Pen Inject 25-30 Units into the skin daily at 10 pm. Depending on what he eats 08/21/14  Yes [provider]  lovastatin (MEVACOR) 20 MG tablet Take 1 tablet by mouth daily. 12/25/16  Yes [provider]  methylphenidate (RITALIN) 5 MG tablet Take 1 tablet by mouth daily. 11/27/16  Yes [provider]  metoCLOPramide (REGLAN) 5 MG tablet Take 5 mg by mouth 2 (two) times daily. 1 TAB M,W,F 1TAB BID T,T,S,S   Yes [provider]  metoprolol tartrate (LOPRESSOR) 100 MG tablet Take 25 mg by mouth See admin instructions. Pt takes Tues morning, Friday Night, and Saturday morning  and evening   Yes [provider]  multivitamin (RENA-VIT) TABS tablet Take 1 tablet by mouth at bedtime.    Yes [provider]  neomycin-polymyxin b-dexamethasone (MAXITROL) 3.5-10000-0.1 OINT Place 1 application into the left eye 2 (two) times daily.  08/16/16  Yes [provider]  nystatin cream (MYCOSTATIN) Apply 1 application topically 2 (two) times daily.   Yes [provider]  silver sulfADIAZINE (SILVADENE) 1 % cream Apply 1 application topically daily. 07/04/16  Yes Edrick Kins, DPM  ZIRGAN 0.15 % GEL Place 1 application into the left eye 3 (three) times daily as needed. 12/05/16  Yes [provider]      PHYSICAL EXAMINATION:   VITAL SIGNS: Blood pressure (!) 173/59, pulse 64, resp. rate (!) 22, SpO2 90 %.  GENERAL:  71 y.o.-year-old patient lying in the bed with no acute distress.  EYES: Pupils equal, round, reactive to light and accommodation. No scleral icterus. Extraocular muscles intact.  HEENT: Head atraumatic, normocephalic. Oropharynx and nasopharynx clear.  NECK:  Supple, no jugular venous distention. No thyroid enlargement, no  tenderness.  LUNGS: Decreased breath sounds bilaterally, bibasilar crepitations heard. No use of accessory muscles of respiration.  CARDIOVASCULAR: S1, S2 normal. No murmurs, rubs, or gallops.  ABDOMEN: Soft, nontender, nondistended. Bowel sounds present. No organomegaly or mass.  EXTREMITIES: Has pedal edema,  No cyanosis, or clubbing.  NEUROLOGIC: Cranial nerves II through XII are intact. Muscle strength 5/5 in all extremities. Sensation intact. Gait not checked.  PSYCHIATRIC: The patient is alert and oriented x 3.  SKIN: No obvious rash, lesion, or ulcer.   LABORATORY PANEL:   CBC  Recent Labs Lab 01/15/17 0045  WBC 5.9  HGB 9.2*  HCT 26.7*  PLT 148*  MCV 100.2*  MCH 34.4*  MCHC 34.3  RDW 16.1*  LYMPHSABS 2.9  MONOABS 0.1*  EOSABS 0.2  BASOSABS 0.0   ------------------------------------------------------------------------------------------------------------------  Chemistries   Recent Labs Lab 01/15/17 0045  NA 131*  K 4.6  CL 94*  CO2 23  GLUCOSE 284*  BUN 46*  CREATININE 7.00*  CALCIUM 8.5*   ------------------------------------------------------------------------------------------------------------------ estimated creatinine clearance is 11.2 mL/min (A) (by C-G formula based on SCr of 7 mg/dL (H)). ------------------------------------------------------------------------------------------------------------------ No results for input(s): TSH, T4TOTAL, T3FREE, THYROIDAB in the last 72 hours.  Invalid input(s): FREET3   Coagulation profile No results for input(s): INR, PROTIME in the last 168 hours. ------------------------------------------------------------------------------------------------------------------- No results for input(s): DDIMER in the last 72 hours. -------------------------------------------------------------------------------------------------------------------  Cardiac Enzymes  Recent Labs Lab 01/15/17 0045  TROPONINI <0.03    ------------------------------------------------------------------------------------------------------------------ Invalid input(s): POCBNP  ---------------------------------------------------------------------------------------------------------------  Urinalysis    Component Value Date/Time   COLORURINE YELLOW (A) 10/20/2016 1132   APPEARANCEUR HAZY (A) 10/20/2016 1132   APPEARANCEUR Clear 01/06/2016 1433   LABSPEC 1.012 10/20/2016 1132   LABSPEC 1.014 10/20/2013 1835   PHURINE 5.0 10/20/2016 1132   GLUCOSEU NEGATIVE 10/20/2016 1132   GLUCOSEU 150 mg/dL 10/20/2013 1835   HGBUR MODERATE (A) 10/20/2016 1132   BILIRUBINUR NEGATIVE 10/20/2016 1132   BILIRUBINUR Negative 01/06/2016 1433   BILIRUBINUR Negative 10/20/2013 1835   KETONESUR NEGATIVE 10/20/2016 1132   PROTEINUR 100 (A) 10/20/2016 1132   NITRITE NEGATIVE 10/20/2016 1132   LEUKOCYTESUR LARGE (A) 10/20/2016 1132   LEUKOCYTESUR Negative 01/06/2016 1433   LEUKOCYTESUR 1+ 10/20/2013 1835     RADIOLOGY: Dg Chest 2 View  Result Date: 01/15/2017 CLINICAL DATA:  Acute onset shortness of breath. EXAM: CHEST  2 VIEW COMPARISON:  Chest 10/20/2016.  CT  chest 11/30/2016. FINDINGS: Shallow inspiration. Cardiac enlargement. Prominent right and left paratracheal stripe likely corresponding to known mediastinal lymphadenopathy. There is new perihilar infiltration, greater on the left. This may be due to edema or pneumonia. Blunting of the costophrenic angles suggesting small pleural effusions. Small amount of fluid in the fissures. No pneumothorax. Calcification of the aorta. IMPRESSION: 1. Prominent paratracheal soft tissue corresponding to known mediastinal lymphadenopathy. 2. Mild cardiac enlargement. 3. Perihilar infiltrates, greater on the left, with small bilateral pleural effusions. Changes may represent edema or pneumonia. 4. Aortic atherosclerosis. Electronically Signed   By: Lucienne Capers M.D.   On: 01/15/2017 01:16     EKG: Orders placed or performed during the hospital encounter of 01/15/17  . ED EKG  . ED EKG    IMPRESSION AND PLAN: 71 year old male patient with history of end-stage renal is on dialysis, hypertension, diabetes mellitus, GERD, lymphoma, arthritis, neuropathy presented to the emergency room with shortness of breath and swelling of the legs.  Admitting diagnosis 1. Fluid overload 2. End-stage renal disease on dialysis 3. Diabetes mellitus type 2 4. Hypertension Treatment plan Admit patient to medical floor Nephrology consultation for dialysis IV Lasix for diuresis DVT prophylaxis subcutaneous heparin Medical management for hypertension and diabetes  All the records are reviewed and case discussed with ED provider. Management plans discussed with the patient, family and they are in agreement.  CODE STATUS:FULL CODE Code Status History    Date Active Date Inactive Code Status Order ID Comments User Context   10/20/2016  4:27 PM 10/22/2016  6:17 PM Full Code [id number]  Lance Coon, MD Inpatient    Advance Directive Documentation     Most Recent Value  Type of Advance Directive  Healthcare Power of Attorney  Pre-existing out of facility DNR order (yellow form or pink MOST form)  -  "MOST" Form in Place?  -       TOTAL TIME TAKING CARE OF THIS PATIENT: 50 minutes.    Saundra Shelling M.D on 01/15/2017 at 3:45 AM  Between 7am to 6pm - Pager - [phone]  After 6pm go to [email or web address] - password [email or web address]  Office  [phone]  CC: Primary care physician; Marden Noble, MD

## 2017-01-15 NOTE — Progress Notes (Signed)
Pre HD  

## 2017-01-15 NOTE — Care Management Obs Status (Signed)
[facility] NOTIFICATION   Patient Details  Name: Mark [name]. MRN: [id number] Date of Birth: [birth year]   Medicare Observation Status Notification Given:  No (off the floor for [place])    Beverly Sessions, RN 01/15/2017, 5:18 PM

## 2017-01-16 LAB — GLUCOSE, CAPILLARY
GLUCOSE-CAPILLARY: 158 mg/dL — AB (ref 65–99)
GLUCOSE-CAPILLARY: 363 mg/dL — AB (ref 65–99)
GLUCOSE-CAPILLARY: 394 mg/dL — AB (ref 65–99)
Glucose-Capillary: 36 mg/dL — CL (ref 65–99)
Glucose-Capillary: 174 mg/dL — ABNORMAL HIGH (ref 65–99)

## 2017-01-16 MED ORDER — IPRATROPIUM-ALBUTEROL 0.5-2.5 (3) MG/3ML IN SOLN
3.0000 mL | Freq: Four times a day (QID) | RESPIRATORY_TRACT | Status: DC
  Administered 2017-01-16 – 2017-01-17 (×3): 3 mL via RESPIRATORY_TRACT
  Filled 2017-01-16 (×3): qty 3

## 2017-01-16 MED ORDER — LACTULOSE 10 GM/15ML PO SOLN
30.0000 g | Freq: Four times a day (QID) | ORAL | Status: DC
  Administered 2017-01-16: 30 g via ORAL
  Filled 2017-01-16: qty 60

## 2017-01-16 MED ORDER — INSULIN DETEMIR 100 UNIT/ML ~~LOC~~ SOLN
14.0000 [IU] | Freq: Every day | SUBCUTANEOUS | Status: DC
  Administered 2017-01-16 – 2017-01-17 (×2): 14 [IU] via SUBCUTANEOUS
  Filled 2017-01-16 (×2): qty 0.14

## 2017-01-16 MED ORDER — METHYLPREDNISOLONE SODIUM SUCC 125 MG IJ SOLR
60.0000 mg | INTRAMUSCULAR | Status: DC
  Administered 2017-01-16 – 2017-01-17 (×2): 60 mg via INTRAVENOUS
  Filled 2017-01-16 (×3): qty 2

## 2017-01-16 MED ORDER — DEXTROSE 50 % IV SOLN
50.0000 mL | Freq: Once | INTRAVENOUS | Status: AC
  Administered 2017-01-16: 50 mL via INTRAVENOUS
  Filled 2017-01-16: qty 50

## 2017-01-16 MED ORDER — ACYCLOVIR 200 MG PO CAPS
400.0000 mg | ORAL_CAPSULE | Freq: Once | ORAL | Status: AC
  Administered 2017-01-16: 400 mg via ORAL
  Filled 2017-01-16: qty 2

## 2017-01-16 NOTE — Evaluation (Signed)
Physical Therapy Evaluation Patient Details Name: Mark [name]. MRN: [id number] DOB: 01-Sep-1945 Today's Date: 01/16/2017   History of Present Illness  Pt admitted for fluid overload. History includes HD (MWF), GERD, DM, and HTN. Pt currently with complaints of SOB symptoms.   Clinical Impression  Pt is a pleasant 71 year old male who was admitted for fluid overload. Pt performs transfers with min assist, and ambulation with cga. All mobility performed with RW. Pt currently on 2L O2 acutely, however does not have for home use.  Performed limited in room ambulation on room air with sats decreasing to 77% and SOB symptoms; limiting further mobility at this time. Takes increased time for recovery; back on 2L of O2. Pt demonstrates deficits with strength/mobility/balance/endurance. Would benefit from skilled PT to address above deficits and promote optimal return to PLOF. Recommend transition to [place] upon discharge from acute hospitalization. Will need assistance at home if endurance continues to be a problem.       Follow Up Recommendations Home health PT;Supervision/Assistance - 24 hour    Equipment Recommendations  None recommended by PT    Recommendations for Other Services       Precautions / Restrictions Precautions Precautions: Fall Restrictions Weight Bearing Restrictions: No      Mobility  Bed Mobility               General bed mobility comments: pt received seated on BSC, bed mobility not performed.  Transfers Overall transfer level: Needs assistance Equipment used: Rolling walker (2 wheeled) Transfers: Sit to/from Stand Sit to Stand: Min assist         General transfer comment: sit<>Stand from low surface on BSC, needed min assist for performance. 2nd attempt from bed height with only cga required. Once standing, able to stand with upright posture.  Ambulation/Gait Ambulation/Gait assistance: Min guard Ambulation Distance (Feet): 20 Feet Assistive  device: Rolling walker (2 wheeled) Gait Pattern/deviations: Step-to pattern     General Gait Details: slow step to gait pattern with visable fatigue secondary to SOB symptoms. All mobility performed on room air with sats decreasing to 77%, taking increased time for sats to improve to 90%. RN in room during mobility.  REapplied 2L of O2 once seated. Also applied AFO prior to ambulation. Further ambulation deferred this date.  Stairs            Wheelchair Mobility    Modified Rankin (Stroke Patients Only)       Balance Overall balance assessment: Needs assistance Sitting-balance support: Feet supported;Bilateral upper extremity supported Sitting balance-Leahy Scale: Good     Standing balance support: Bilateral upper extremity supported Standing balance-Leahy Scale: Fair Standing balance comment: slight posterior leaning, needs cues for safety including to keep hands on RW as he reaches for furniture                             Pertinent Vitals/Pain Pain Assessment: No/denies pain    Home Living Family/patient expects to be discharged to:: Private residence Living Arrangements: Spouse/significant other Available Help at Discharge: Family;Available 24 hours/day Type of Home: House Home Access: Ramped entrance     Home Layout: One level Home Equipment: Walker - 2 wheels;Walker - 4 wheels;Wheelchair - manual;Transport chair;Bedside commode;Shower seat;Grab bars - tub/shower;Grab bars - toilet;Hand held shower head;Cane - quad;Cane - single point      Prior Function Level of Independence: Independent with assistive device(s)  Comments: Pt ambulates ~50 ft with rollator.  Otherwise he uses either WC or transport chair.  Has family push him in the [facility] when he does have to use it.  He denies any falls in the past 6 months.  Wife does most of the cooking, cleaning, and she does the driving.  Pt able to shower and dress independently. Per patient, he has been  working with [place] and working in increasing ambulation distance.     Hand Dominance   Dominant Hand: Right    Extremity/Trunk Assessment   Upper Extremity Assessment Upper Extremity Assessment: Overall WFL for tasks assessed    Lower Extremity Assessment Lower Extremity Assessment: Generalized weakness (B LE grossly 4/5)       Communication   Communication: No difficulties  Cognition Arousal/Alertness: Awake/alert Behavior During Therapy: WFL for tasks assessed/performed Overall Cognitive Status: Within Functional Limits for tasks assessed                                        General Comments      Exercises Other Exercises Other Exercises: performed sit<>Stand and performed hygiene while on BSC. Has difficulty standing from lower surface.   Assessment/Plan    PT Assessment Patient needs continued PT services  PT Problem List Decreased strength;Decreased activity tolerance;Decreased balance;Decreased mobility;Decreased safety awareness       PT Treatment Interventions DME instruction;Gait training;Therapeutic exercise;Balance training    PT Goals (Current goals can be found in the Care Plan section)  Acute Rehab PT Goals Patient Stated Goal: to get stronger PT Goal Formulation: With patient Time For Goal Achievement: 01/30/17 Potential to Achieve Goals: Good    Frequency Min 2X/week   Barriers to discharge        Co-evaluation               AM-PAC PT "6 Clicks" Daily Activity  Outcome Measure Difficulty turning over in bed (including adjusting bedclothes, sheets and blankets)?: None Difficulty moving from lying on back to sitting on the side of the bed? : None Difficulty sitting down on and standing up from a chair with arms (e.g., wheelchair, bedside commode, etc,.)?: Unable Help needed moving to and from a bed to chair (including a wheelchair)?: A Little Help needed walking in hospital room?: A Little Help needed climbing 3-5  steps with a railing? : A Little 6 Click Score: 18    End of Session Equipment Utilized During Treatment: Gait belt;Oxygen Activity Tolerance: Treatment limited secondary to medical complications (Comment) Patient left: in chair;with chair alarm set;with nursing/sitter in room Nurse Communication: Mobility status PT Visit Diagnosis: Muscle weakness (generalized) (M62.81);Unsteadiness on feet (R26.81);Difficulty in walking, not elsewhere classified (R26.2)    Time: 9357-0177 PT Time Calculation (min) (ACUTE ONLY): 25 min   Charges:   PT Evaluation $PT Eval Moderate Complexity: 1 Mod PT Treatments $Therapeutic Activity: 8-22 mins   PT G Codes:   PT G-Codes **NOT FOR INPATIENT CLASS** Functional Assessment Tool Used: AM-PAC 6 Clicks Basic Mobility Functional Limitation: Mobility: Walking and moving around Mobility: Walking and Moving Around Current Status (L3903): At least 40 percent but less than 60 percent impaired, limited or restricted Mobility: Walking and Moving Around Goal Status [phone]): At least 20 percent but less than 40 percent impaired, limited or restricted    Greggory Stallion, PT, DPT [phone]   Mark [name] 01/16/2017, 3:36 PM

## 2017-01-16 NOTE — Progress Notes (Signed)
[facility]  ROUNDING NOTE   Subjective:   Hemodialysis treatment yesterday. Tolerated treatment well. UF of 2 liters  Objective:  Vital signs in last 24 hours:  Temp:  [98 F (36.7 C)-98.8 F (37.1 C)] 98 F (36.7 C) (10/16 1128) Pulse Rate:  [63-80] 63 (10/16 1128) Resp:  [12-25] 18 (10/16 1128) BP: (147-171)/(51-67) 154/55 (10/16 1128) SpO2:  [87 %-96 %] 92 % (10/16 1128) Weight:  [93.9 kg (207 lb)] 93.9 kg (207 lb) (10/16 0527)  Weight change: -0.038 kg (-1.3 oz) Filed Weights   01/15/17 0435 01/15/17 1024 01/16/17 0527  Weight: 94.9 kg (209 lb 4.8 oz) 94.9 kg (209 lb 3.5 oz) 93.9 kg (207 lb)    Intake/Output: I/O last 3 completed shifts: In: 360 [P.O.:360] Out: 0    Intake/Output this shift:  Total I/O In: 360 [P.O.:360] Out: 150 [Urine:150]  Physical Exam: General: NAD, laying in bed  Head: Normocephalic, atraumatic. Moist oral mucosal membranes  Eyes: Anicteric, PERRL  Neck: Supple, trachea midline  Lungs:  Wheezing, 1.5 L [place]  Heart: Regular rate and rhythm  Abdomen:  Soft, nontender,   Extremities:  no peripheral edema.  Neurologic: Nonfocal, moving all four extremities  Skin: No lesions  Access: Left AVF    Basic Metabolic Panel:  Recent Labs Lab 01/15/17 0045 01/15/17 1000  NA 131* 132*  K 4.6 4.3  CL 94* 95*  CO2 23 25  GLUCOSE 284* 210*  BUN 46* 53*  CREATININE 7.00* 7.80*  CALCIUM 8.5* 8.3*  PHOS  --  4.8*    Liver Function Tests:  Recent Labs Lab 01/15/17 1000  ALBUMIN 3.7   No results for input(s): LIPASE, AMYLASE in the last 168 hours. No results for input(s): AMMONIA in the last 168 hours.  CBC:  Recent Labs Lab 01/15/17 0045 01/15/17 1000  WBC 5.9 3.9  NEUTROABS 2.7  --   HGB 9.2* 10.4*  HCT 26.7* 30.1*  MCV 100.2* 99.3  PLT 148* 99*    Cardiac Enzymes:  Recent Labs Lab 01/15/17 0045  TROPONINI <0.03    BNP: Invalid input(s): POCBNP  CBG:  Recent Labs Lab 01/15/17 1700  01/15/17 2223 01/16/17 0808 01/16/17 0821 01/16/17 1128  GLUCAP 142* 169* 88* 158* 174*    Microbiology: Results for orders placed or performed during the hospital encounter of 01/15/17  MRSA PCR Screening     Status: None   Collection Time: 01/15/17  4:42 AM  Result Value Ref Range Status   MRSA by PCR NEGATIVE NEGATIVE Final    Comment:        The GeneXpert MRSA Assay (FDA approved for NASAL specimens only), is one component of a comprehensive MRSA colonization surveillance program. It is not intended to diagnose MRSA infection nor to guide or monitor treatment for MRSA infections.     Coagulation Studies: No results for input(s): LABPROT, INR in the last 72 hours.  Urinalysis: No results for input(s): COLORURINE, LABSPEC, PHURINE, GLUCOSEU, HGBUR, BILIRUBINUR, KETONESUR, PROTEINUR, UROBILINOGEN, NITRITE, LEUKOCYTESUR in the last 72 hours.  Invalid input(s): APPERANCEUR    Imaging: Dg Chest 2 View  Result Date: 01/15/2017 CLINICAL DATA:  Acute onset shortness of breath. EXAM: CHEST  2 VIEW COMPARISON:  Chest 10/20/2016.  CT chest 11/30/2016. FINDINGS: Shallow inspiration. Cardiac enlargement. Prominent right and left paratracheal stripe likely corresponding to known mediastinal lymphadenopathy. There is new perihilar infiltration, greater on the left. This may be due to edema or pneumonia. Blunting of the costophrenic angles suggesting small pleural effusions.  Small amount of fluid in the fissures. No pneumothorax. Calcification of the aorta. IMPRESSION: 1. Prominent paratracheal soft tissue corresponding to known mediastinal lymphadenopathy. 2. Mild cardiac enlargement. 3. Perihilar infiltrates, greater on the left, with small bilateral pleural effusions. Changes may represent edema or pneumonia. 4. Aortic atherosclerosis. Electronically Signed   By: Lucienne Capers M.D.   On: 01/15/2017 01:16   US Venous Img Lower Unilateral Left  Result Date: 01/15/2017 CLINICAL  DATA:  Left lower extremity pain and edema for the past 3 weeks. Shortness of breath. History of bladder cancer and lymphoma. Evaluate for DVT. EXAM: LEFT LOWER EXTREMITY VENOUS DOPPLER ULTRASOUND TECHNIQUE: Gray-scale sonography with graded compression, as well as color Doppler and duplex ultrasound were performed to evaluate the lower extremity deep venous systems from the level of the common femoral vein and including the common femoral, femoral, profunda femoral, popliteal and calf veins including the posterior tibial, peroneal and gastrocnemius veins when visible. The superficial great saphenous vein was also interrogated. Spectral Doppler was utilized to evaluate flow at rest and with distal augmentation maneuvers in the common femoral, femoral and popliteal veins. COMPARISON:  None. FINDINGS: Contralateral Common Femoral Vein: Respiratory phasicity is normal and symmetric with the symptomatic side. No evidence of thrombus. Normal compressibility. Common Femoral Vein: No evidence of thrombus. Normal compressibility, respiratory phasicity and response to augmentation. Saphenofemoral Junction: No evidence of thrombus. Normal compressibility and flow on color Doppler imaging. Profunda Femoral Vein: No evidence of thrombus. Normal compressibility and flow on color Doppler imaging. Femoral Vein: No evidence of thrombus. Normal compressibility, respiratory phasicity and response to augmentation. Popliteal Vein: No evidence of thrombus. Normal compressibility, respiratory phasicity and response to augmentation. Calf Veins: No evidence of thrombus. Normal compressibility and flow on color Doppler imaging. Superficial Great Saphenous Vein: No evidence of thrombus. Normal compressibility. Venous Reflux:  None. Other Findings: There is a minimal amount of subcutaneous edema at the level of the left lower leg. IMPRESSION: No evidence of DVT within the left lower extremity. Electronically Signed   By: Sandi Mariscal M.D.    On: 01/15/2017 16:53     Medications:   . sodium chloride     . acyclovir  400 mg Oral q morning - 10a  . amLODipine  10 mg Oral Daily  . aspirin  325 mg Oral Daily  . calcitRIOL  0.25 mcg Oral Daily  . calcium acetate  1,334 mg Oral BID BM   And  . calcium acetate  2,001 mg Oral TID WC  . Difluprednate  1 drop Left Eye BID  . ferrous sulfate  325 mg Oral Q breakfast  . gabapentin  100 mg Oral TID  . Ganciclovir  1 application Left Eye QHS  . heparin  5,000 Units Subcutaneous Q8H  . Ibrutinib  1 capsule Oral QPC supper  . insulin aspart  0-9 Units Subcutaneous TID WC  . insulin detemir  14 Units Subcutaneous Q2200  . ipratropium-albuterol  3 mL Nebulization Q6H  . methylphenidate  5 mg Oral Daily  . methylPREDNISolone (SOLU-MEDROL) injection  60 mg Intravenous Q24H  . metoCLOPramide  5 mg Oral 2 times per day on Sun Tue Thu Sat   And  . [START ON 01/17/2017] metoCLOPramide  5 mg Oral Q M,W,F  . [START ON 01/20/2017] metoprolol tartrate  25 mg Oral Q Sat   And  . [START ON 01/20/2017] metoprolol tartrate  25 mg Oral Q Sat  . [START ON 01/19/2017] metoprolol tartrate  25 mg Oral  Q Fri   And  . metoprolol tartrate  25 mg Oral Q Tue  . multivitamin  1 tablet Oral QHS  . nystatin cream  1 application Topical BID  . pravastatin  10 mg Oral q1800  . silver sulfADIAZINE  1 application Topical Daily  . sodium chloride flush  3 mL Intravenous Q12H   sodium chloride, acetaminophen **OR** acetaminophen, fluticasone, ondansetron **OR** ondansetron (ZOFRAN) IV, senna-docusate, sodium chloride flush  Assessment/ Plan:  [name]. is a 71 y.o. black male with end stage renal disease on hemodialysis, CLL, diabetes mellitus type II, history of bladder cancer, TIA  [facility] MWF [facility] L AVF  1. End Stage Renal Disease:   EDW 94Kg  Continue MWF schedule.   2. Hypertension: blood pressure elevated.  - amlodipine and metoprolol.   3. Anemia of chronic kidney  disease: with history of CLL on ibrutinib - EPO with treatment.   4. Secondary Hyperparathyroidism: with hyperphosphatemia: PTh 422, calcium 8.1 and phosphorus 5.9 - PO calcitriol - calcium acetate with meals.    LOS: 0 Dymond Spreen 10/16/201812:12 PM

## 2017-01-16 NOTE — Progress Notes (Signed)
[facility] NAME: Mark [name]    MR#:  [id number]  DATE OF BIRTH:  Aug 05, 1945  SUBJECTIVE:  CHIEF COMPLAINT:   Chief Complaint  Patient presents with  . Shortness of Breath   Continues to have shortness of breath. On 3 L oxygen. Feels weak. Blood sugars at 36.  REVIEW OF SYSTEMS:    Review of Systems  Constitutional: Positive for malaise/fatigue. Negative for chills and fever.  HENT: Negative for sore throat.   Eyes: Negative for blurred vision, double vision and pain.  Respiratory: Positive for cough, shortness of breath and wheezing. Negative for hemoptysis.   Cardiovascular: Negative for chest pain, palpitations, orthopnea and leg swelling.  Gastrointestinal: Negative for abdominal pain, constipation, diarrhea, heartburn, nausea and vomiting.  Genitourinary: Negative for dysuria and hematuria.  Musculoskeletal: Negative for back pain and joint pain.  Skin: Negative for rash.  Neurological: Positive for weakness. Negative for sensory change, speech change, focal weakness and headaches.  Endo/Heme/Allergies: Does not bruise/bleed easily.  Psychiatric/Behavioral: Negative for depression. The patient is not nervous/anxious.     DRUG ALLERGIES:   Allergies  Allergen Reactions  . Ibuprofen     DUE TO DIALYSIS  . Multivitamin [Centrum]     DUE TO DIALYSIS  . Daypro [Oxaprozin] Swelling and Rash    Other reaction(s): Other (See Comments)  . Tape Rash    VITALS:  Blood pressure (!) 154/55, pulse 63, temperature 98 F (36.7 C), temperature source Oral, resp. rate 18, weight 93.9 kg (207 lb), SpO2 92 %.  PHYSICAL EXAMINATION:   Physical Exam  GENERAL:  71 y.o.-year-old patient lying in the bed with no acute distress.  EYES: Pupils equal, round, reactive to light and accommodation. No scleral icterus. Extraocular muscles intact.  HEENT: Head atraumatic, normocephalic. Oropharynx and nasopharynx clear.  NECK:  Supple,  no jugular venous distention. No thyroid enlargement, no tenderness.  LUNGS: decreased air entry. Bilateral wheezing. CARDIOVASCULAR: S1, S2 normal. No murmurs, rubs, or gallops.  ABDOMEN: Soft, nontender, nondistended. Bowel sounds present. No organomegaly or mass.  EXTREMITIES: No cyanosis, clubbing or edema b/l.    NEUROLOGIC: Cranial nerves II through XII are intact. No focal Motor or sensory deficits b/l.   PSYCHIATRIC: The patient is alert and oriented x 3.  SKIN: No obvious rash, lesion, or ulcer.   LABORATORY PANEL:   CBC  Recent Labs Lab 01/15/17 1000  WBC 3.9  HGB 10.4*  HCT 30.1*  PLT 99*   ------------------------------------------------------------------------------------------------------------------ Chemistries   Recent Labs Lab 01/15/17 1000  NA 132*  K 4.3  CL 95*  CO2 25  GLUCOSE 210*  BUN 53*  CREATININE 7.80*  CALCIUM 8.3*   ------------------------------------------------------------------------------------------------------------------  Cardiac Enzymes  Recent Labs Lab 01/15/17 0045  TROPONINI <0.03   ------------------------------------------------------------------------------------------------------------------  RADIOLOGY:  Dg Chest 2 View  Result Date: 01/15/2017 CLINICAL DATA:  Acute onset shortness of breath. EXAM: CHEST  2 VIEW COMPARISON:  Chest 10/20/2016.  CT chest 11/30/2016. FINDINGS: Shallow inspiration. Cardiac enlargement. Prominent right and left paratracheal stripe likely corresponding to known mediastinal lymphadenopathy. There is new perihilar infiltration, greater on the left. This may be due to edema or pneumonia. Blunting of the costophrenic angles suggesting small pleural effusions. Small amount of fluid in the fissures. No pneumothorax. Calcification of the aorta. IMPRESSION: 1. Prominent paratracheal soft tissue corresponding to known mediastinal lymphadenopathy. 2. Mild cardiac enlargement. 3. Perihilar infiltrates,  greater on the left, with small bilateral pleural effusions. Changes may represent  edema or pneumonia. 4. Aortic atherosclerosis. Electronically Signed   By: Lucienne Capers M.D.   On: 01/15/2017 01:16   US Venous Img Lower Unilateral Left  Result Date: 01/15/2017 CLINICAL DATA:  Left lower extremity pain and edema for the past 3 weeks. Shortness of breath. History of bladder cancer and lymphoma. Evaluate for DVT. EXAM: LEFT LOWER EXTREMITY VENOUS DOPPLER ULTRASOUND TECHNIQUE: Gray-scale sonography with graded compression, as well as color Doppler and duplex ultrasound were performed to evaluate the lower extremity deep venous systems from the level of the common femoral vein and including the common femoral, femoral, profunda femoral, popliteal and calf veins including the posterior tibial, peroneal and gastrocnemius veins when visible. The superficial great saphenous vein was also interrogated. Spectral Doppler was utilized to evaluate flow at rest and with distal augmentation maneuvers in the common femoral, femoral and popliteal veins. COMPARISON:  None. FINDINGS: Contralateral Common Femoral Vein: Respiratory phasicity is normal and symmetric with the symptomatic side. No evidence of thrombus. Normal compressibility. Common Femoral Vein: No evidence of thrombus. Normal compressibility, respiratory phasicity and response to augmentation. Saphenofemoral Junction: No evidence of thrombus. Normal compressibility and flow on color Doppler imaging. Profunda Femoral Vein: No evidence of thrombus. Normal compressibility and flow on color Doppler imaging. Femoral Vein: No evidence of thrombus. Normal compressibility, respiratory phasicity and response to augmentation. Popliteal Vein: No evidence of thrombus. Normal compressibility, respiratory phasicity and response to augmentation. Calf Veins: No evidence of thrombus. Normal compressibility and flow on color Doppler imaging. Superficial Great Saphenous Vein: No  evidence of thrombus. Normal compressibility. Venous Reflux:  None. Other Findings: There is a minimal amount of subcutaneous edema at the level of the left lower leg. IMPRESSION: No evidence of DVT within the left lower extremity. Electronically Signed   By: Sandi Mariscal M.D.   On: 01/15/2017 16:53     ASSESSMENT AND PLAN:   * uncontrolled diabetes mellitus with hypoglycemia Stat dose of D50 given. We'll reduce dose of Lantus. Now that we are starting IV steroids his blood sugars may be uncontrolled.  * Acute hypoxic respiratory failure due to pulmonary edema secondary to end-stage renal disease Patient had hemodialysis yesterday. Will need further hemodialysis. Appreciate nephrology input. Fluid restriction  * Acute bronchitis. Start IV Solu-Medrol and nebulizers.  * DVT prophylaxis with heparin  All the records are reviewed and case discussed with Care Management/Social Workerr. Management plans discussed with the patient, family and they are in agreement.  CODE STATUS: FULL CODE  DVT Prophylaxis: SCDs  TOTAL CC TIME TAKING CARE OF THIS PATIENT: 35 minutes.   POSSIBLE D/C IN 1-2 DAYS, DEPENDING ON CLINICAL CONDITION.  Hillary Bow R M.D on 01/16/2017 at 12:10 PM  Between 7am to 6pm - Pager - [phone]  After 6pm go to [email or web address] - password [email or web address] [facility]  Office  [phone]  CC: Primary care physician; Marden Noble, MD  Note: This dictation was prepared with Dragon dictation along with smaller phrase technology. Any transcriptional errors that result from this process are unintentional.

## 2017-01-16 NOTE — Care Management Obs Status (Signed)
[facility] NOTIFICATION   Patient Details  Name: Mark [name]. MRN: [id number] Date of Birth: 01/19/1946   Medicare Observation Status Notification Given:  Yes    Beverly Sessions, RN 01/16/2017, 3:51 PM

## 2017-01-16 NOTE — Care Management (Addendum)
Patient admitted from home with uncontrolled DM.  Patient lives at home with wife.  PCP [facility]. [facility].  PT has assessed and recommends home health PT.  Patient is open with [facility] for PT.  Sarah with [facility] aware of admission.  Patient would benefit from RN at discharge as well.  Patient utilizes family members, and wife for transportation. Has walker, cane, and shower chair in the home.  Patient currently requiring acute O2. Requested qualifying O2 sats.  [facility] HD liaison notified of admission.  RNCM following for discharge planning.

## 2017-01-16 NOTE — Progress Notes (Signed)
Inpatient Diabetes Program Recommendations  AACE/ADA: New Consensus Statement on Inpatient Glycemic Control (2015)  Target Ranges:  Prepandial:   less than 140 mg/dL      Peak postprandial:   less than 180 mg/dL (1-2 hours)      Critically ill patients:  140 - 180 mg/dL   Lab Results  Component Value Date   GLUCAP 158 (H) 01/16/2017   HGBA1C 5.9 (H) 01/15/2017    Review of Glycemic Control  Results for [name], Mark [name] (MRN [id number]) as of 01/16/2017 09:13  Ref. Range 11/30/2016 10:20 01/15/2017 17:00 01/15/2017 22:23 01/16/2017 08:08 01/16/2017 08:21  Glucose-Capillary Latest Ref Range: 65 - 99 mg/dL 107 (H) 142 (H) 169 (H) 36 (LL) 158 (H)    History: Type 2  Home DM Meds: Levemir 25-30 units QHS                             Amaryl 8 mg QHS  Current Insulin Orders: Levemir 20 units QHS                                       Novolog 0-9 units tid   * Solu-medrol 60mg  q24hours  Hypoglycemia this am, likely as a result of dialysis yesterday and Amaryl on board on admission.    Please consider not re-ordering Amaryl at discharge since it is eliminated via the kidney and has a high potential for hypoglycemia.  Gentry Fitz, RN, BA, MHA, CDE Diabetes Coordinator Inpatient Diabetes Program  [phone] (Team Pager) [phone] ([place]) 01/16/2017 9:20 AM

## 2017-01-17 ENCOUNTER — Encounter: Payer: Self-pay | Admitting: Internal Medicine

## 2017-01-17 DIAGNOSIS — J9601 Acute respiratory failure with hypoxia: Secondary | ICD-10-CM | POA: Diagnosis not present

## 2017-01-17 DIAGNOSIS — Z7951 Long term (current) use of inhaled steroids: Secondary | ICD-10-CM | POA: Diagnosis not present

## 2017-01-17 DIAGNOSIS — Z9841 Cataract extraction status, right eye: Secondary | ICD-10-CM | POA: Diagnosis not present

## 2017-01-17 DIAGNOSIS — Z809 Family history of malignant neoplasm, unspecified: Secondary | ICD-10-CM | POA: Diagnosis not present

## 2017-01-17 DIAGNOSIS — E1122 Type 2 diabetes mellitus with diabetic chronic kidney disease: Secondary | ICD-10-CM | POA: Diagnosis present

## 2017-01-17 DIAGNOSIS — Z992 Dependence on renal dialysis: Secondary | ICD-10-CM | POA: Diagnosis not present

## 2017-01-17 DIAGNOSIS — E8770 Fluid overload, unspecified: Secondary | ICD-10-CM | POA: Diagnosis present

## 2017-01-17 DIAGNOSIS — Z87891 Personal history of nicotine dependence: Secondary | ICD-10-CM | POA: Diagnosis not present

## 2017-01-17 DIAGNOSIS — N186 End stage renal disease: Secondary | ICD-10-CM | POA: Diagnosis present

## 2017-01-17 DIAGNOSIS — Z9842 Cataract extraction status, left eye: Secondary | ICD-10-CM | POA: Diagnosis not present

## 2017-01-17 DIAGNOSIS — E1140 Type 2 diabetes mellitus with diabetic neuropathy, unspecified: Secondary | ICD-10-CM | POA: Diagnosis present

## 2017-01-17 DIAGNOSIS — Z8551 Personal history of malignant neoplasm of bladder: Secondary | ICD-10-CM | POA: Diagnosis not present

## 2017-01-17 DIAGNOSIS — J441 Chronic obstructive pulmonary disease with (acute) exacerbation: Secondary | ICD-10-CM | POA: Diagnosis not present

## 2017-01-17 DIAGNOSIS — K589 Irritable bowel syndrome without diarrhea: Secondary | ICD-10-CM | POA: Diagnosis present

## 2017-01-17 DIAGNOSIS — K219 Gastro-esophageal reflux disease without esophagitis: Secondary | ICD-10-CM | POA: Diagnosis present

## 2017-01-17 DIAGNOSIS — J440 Chronic obstructive pulmonary disease with acute lower respiratory infection: Secondary | ICD-10-CM | POA: Diagnosis not present

## 2017-01-17 DIAGNOSIS — H9190 Unspecified hearing loss, unspecified ear: Secondary | ICD-10-CM | POA: Diagnosis present

## 2017-01-17 DIAGNOSIS — R0602 Shortness of breath: Secondary | ICD-10-CM | POA: Diagnosis present

## 2017-01-17 DIAGNOSIS — Z7982 Long term (current) use of aspirin: Secondary | ICD-10-CM | POA: Diagnosis not present

## 2017-01-17 DIAGNOSIS — E11649 Type 2 diabetes mellitus with hypoglycemia without coma: Secondary | ICD-10-CM | POA: Diagnosis present

## 2017-01-17 DIAGNOSIS — N2581 Secondary hyperparathyroidism of renal origin: Secondary | ICD-10-CM | POA: Diagnosis present

## 2017-01-17 DIAGNOSIS — Z8673 Personal history of transient ischemic attack (TIA), and cerebral infarction without residual deficits: Secondary | ICD-10-CM | POA: Diagnosis not present

## 2017-01-17 DIAGNOSIS — Z794 Long term (current) use of insulin: Secondary | ICD-10-CM | POA: Diagnosis not present

## 2017-01-17 DIAGNOSIS — Z961 Presence of intraocular lens: Secondary | ICD-10-CM | POA: Diagnosis present

## 2017-01-17 DIAGNOSIS — C9110 Chronic lymphocytic leukemia of B-cell type not having achieved remission: Secondary | ICD-10-CM | POA: Diagnosis present

## 2017-01-17 LAB — GLUCOSE, CAPILLARY
GLUCOSE-CAPILLARY: 116 mg/dL — AB (ref 65–99)
GLUCOSE-CAPILLARY: 328 mg/dL — AB (ref 65–99)
Glucose-Capillary: 216 mg/dL — ABNORMAL HIGH (ref 65–99)
Glucose-Capillary: 466 mg/dL — ABNORMAL HIGH (ref 65–99)
Glucose-Capillary: 478 mg/dL — ABNORMAL HIGH (ref 65–99)

## 2017-01-17 LAB — RENAL FUNCTION PANEL
ALBUMIN: 4.2 g/dL (ref 3.5–5.0)
ANION GAP: 14 (ref 5–15)
BUN: 62 mg/dL — ABNORMAL HIGH (ref 6–20)
CO2: 26 mmol/L (ref 22–32)
Calcium: 8.9 mg/dL (ref 8.9–10.3)
Chloride: 91 mmol/L — ABNORMAL LOW (ref 101–111)
Creatinine, Ser: 7.01 mg/dL — ABNORMAL HIGH (ref 0.61–1.24)
GFR calc Af Amer: 8 mL/min — ABNORMAL LOW (ref >60)
GFR calc non Af Amer: 7 mL/min — ABNORMAL LOW (ref >60)
Glucose, Bld: 228 mg/dL — ABNORMAL HIGH (ref 65–99)
PHOSPHORUS: 3.7 mg/dL (ref 2.5–4.6)
Potassium: 4.9 mmol/L (ref 3.5–5.1)
SODIUM: 131 mmol/L — AB (ref 135–145)

## 2017-01-17 LAB — CBC
HEMATOCRIT: 23.6 % — AB (ref 40.0–52.0)
HEMOGLOBIN: 8.1 g/dL — AB (ref 13.0–18.0)
MCH: 34.2 pg — ABNORMAL HIGH (ref 26.0–34.0)
MCHC: 34.2 g/dL (ref 32.0–36.0)
MCV: 100.1 fL — ABNORMAL HIGH (ref 80.0–100.0)
Platelets: 136 10*3/uL — ABNORMAL LOW (ref 150–440)
RBC: 2.36 MIL/uL — ABNORMAL LOW (ref 4.40–5.90)
RDW: 16.5 % — ABNORMAL HIGH (ref 11.5–14.5)
WBC: 5.6 10*3/uL (ref 3.8–10.6)

## 2017-01-17 MED ORDER — IPRATROPIUM-ALBUTEROL 0.5-2.5 (3) MG/3ML IN SOLN
3.0000 mL | Freq: Four times a day (QID) | RESPIRATORY_TRACT | Status: DC | PRN
  Administered 2017-01-17: 3 mL via RESPIRATORY_TRACT
  Filled 2017-01-17: qty 3

## 2017-01-17 MED ORDER — METOCLOPRAMIDE HCL 5 MG PO TABS
5.0000 mg | ORAL_TABLET | Freq: Three times a day (TID) | ORAL | Status: DC
  Administered 2017-01-18 (×2): 5 mg via ORAL
  Filled 2017-01-17 (×2): qty 1

## 2017-01-17 MED ORDER — ALBUTEROL SULFATE HFA 108 (90 BASE) MCG/ACT IN AERS: 2.0000 | INHALATION_SPRAY | Freq: Four times a day (QID) | RESPIRATORY_TRACT | 2 refills | Status: DC | PRN

## 2017-01-17 MED ORDER — EPOETIN ALFA 10000 UNIT/ML IJ SOLN
10000.0000 [IU] | INTRAMUSCULAR | Status: DC
  Administered 2017-01-17: 10000 [IU] via INTRAVENOUS

## 2017-01-17 MED ORDER — INSULIN REGULAR HUMAN 100 UNIT/ML IJ SOLN
5.0000 [IU] | Freq: Once | INTRAMUSCULAR | Status: AC
  Administered 2017-01-17: 5 [IU] via INTRAVENOUS
  Filled 2017-01-17: qty 0.05

## 2017-01-17 MED ORDER — INSULIN ASPART 100 UNIT/ML ~~LOC~~ SOLN
0.0000 [IU] | Freq: Three times a day (TID) | SUBCUTANEOUS | Status: DC
  Administered 2017-01-18: 8 [IU] via SUBCUTANEOUS
  Administered 2017-01-18: 5 [IU] via SUBCUTANEOUS
  Filled 2017-01-17 (×2): qty 1

## 2017-01-17 MED ORDER — INSULIN REGULAR HUMAN 100 UNIT/ML IJ SOLN
5.0000 [IU] | Freq: Once | INTRAMUSCULAR | Status: AC
  Administered 2017-01-18: 5 [IU] via INTRAVENOUS
  Filled 2017-01-17: qty 0.05

## 2017-01-17 MED ORDER — PREDNISONE 10 MG (21) PO TBPK
ORAL_TABLET | ORAL | 0 refills | Status: DC
Start: 2017-01-17 — End: 2017-01-18

## 2017-01-17 NOTE — Progress Notes (Signed)
Pre HD  

## 2017-01-17 NOTE — Progress Notes (Signed)
Post HD  

## 2017-01-17 NOTE — Progress Notes (Signed)
He likely has COPD with smoking history. Will need home O2 at discharge and pulmonary function test as outpatient.

## 2017-01-17 NOTE — Care Management (Signed)
Per RN patient may need home supplemental O2 arranged. Referral made to [facility]. RNCM will follow. [phone]

## 2017-01-17 NOTE — Progress Notes (Signed)
[facility]  ROUNDING NOTE   Subjective:   Seen and examined on hemodialysis. Tolerating treatment well. UF of 2.5 liters.   Continues to have shortness of breath.     HEMODIALYSIS FLOWSHEET:  Blood Flow Rate (mL/min): 400 mL/min Arterial Pressure (mmHg): -160 mmHg Venous Pressure (mmHg): 130 mmHg Transmembrane Pressure (mmHg): 70 mmHg Ultrafiltration Rate (mL/min): 1130 mL/min Dialysate Flow Rate (mL/min): 600 ml/min Conductivity: Machine : 14.1 Conductivity: Machine : 14.1 Dialysis Fluid Bolus: Normal Saline Bolus Amount (mL): 250 mL    Objective:  Vital signs in last 24 hours:  Temp:  [97.8 F (36.6 C)-98 F (36.7 C)] 98 F (36.7 C) (10/17 0930) Pulse Rate:  [62-88] 62 (10/17 1045) Resp:  [12-23] 12 (10/17 1045) BP: (145-177)/(55-83) 145/61 (10/17 1045) SpO2:  [92 %-100 %] 100 % (10/17 1045) Weight:  [95.3 kg (210 lb 3.2 oz)-97.9 kg (215 lb 13.3 oz)] 97.9 kg (215 lb 13.3 oz) (10/17 0930)  Weight change: 0.446 kg (15.7 oz) Filed Weights   01/16/17 0527 01/17/17 0500 01/17/17 0930  Weight: 93.9 kg (207 lb) 95.3 kg (210 lb 3.2 oz) 97.9 kg (215 lb 13.3 oz)    Intake/Output: I/O last 3 completed shifts: In: 960 [P.O.:960] Out: 325 [Urine:325]   Intake/Output this shift:  No intake/output data recorded.  Physical Exam: General: NAD, laying in bed  Head: Normocephalic, atraumatic. Moist oral mucosal membranes  Eyes: Anicteric, PERRL  Neck: Supple, trachea midline  Lungs:  Wheezing, 1.5 L [place]  Heart: Regular rate and rhythm  Abdomen:  Soft, nontender,   Extremities:  no peripheral edema.  Neurologic: Nonfocal, moving all four extremities  Skin: No lesions  Access: Left AVF    Basic Metabolic Panel:  Recent Labs Lab 01/15/17 0045 01/15/17 1000 01/17/17 0747  NA 131* 132* 131*  K 4.6 4.3 4.9  CL 94* 95* 91*  CO2 23 25 26   GLUCOSE 284* 210* 228*  BUN 46* 53* 62*  CREATININE 7.00* 7.80* 7.01*  CALCIUM 8.5* 8.3* 8.9  PHOS  --  4.8*  3.7    Liver Function Tests:  Recent Labs Lab 01/15/17 1000 01/17/17 0747  ALBUMIN 3.7 4.2   No results for input(s): LIPASE, AMYLASE in the last 168 hours. No results for input(s): AMMONIA in the last 168 hours.  CBC:  Recent Labs Lab 01/15/17 0045 01/15/17 1000 01/17/17 0747  WBC 5.9 3.9 5.6  NEUTROABS 2.7  --   --   HGB 9.2* 10.4* 8.1*  HCT 26.7* 30.1* 23.6*  MCV 100.2* 99.3 100.1*  PLT 148* 99* 136*    Cardiac Enzymes:  Recent Labs Lab 01/15/17 0045  TROPONINI <0.03    BNP: Invalid input(s): POCBNP  CBG:  Recent Labs Lab 01/16/17 0821 01/16/17 1128 01/16/17 1751 01/16/17 2044 01/17/17 0739  GLUCAP 158* 174* 363* 394* 216*    Microbiology: Results for orders placed or performed during the hospital encounter of 01/15/17  MRSA PCR Screening     Status: None   Collection Time: 01/15/17  4:42 AM  Result Value Ref Range Status   MRSA by PCR NEGATIVE NEGATIVE Final    Comment:        The GeneXpert MRSA Assay (FDA approved for NASAL specimens only), is one component of a comprehensive MRSA colonization surveillance program. It is not intended to diagnose MRSA infection nor to guide or monitor treatment for MRSA infections.     Coagulation Studies: No results for input(s): LABPROT, INR in the last 72 hours.  Urinalysis: No results  for input(s): COLORURINE, LABSPEC, [place], GLUCOSEU, HGBUR, BILIRUBINUR, KETONESUR, PROTEINUR, UROBILINOGEN, NITRITE, LEUKOCYTESUR in the last 72 hours.  Invalid input(s): APPERANCEUR    Imaging: US Venous Img Lower Unilateral Left  Result Date: 01/15/2017 CLINICAL DATA:  Left lower extremity pain and edema for the past 3 weeks. Shortness of breath. History of bladder cancer and lymphoma. Evaluate for DVT. EXAM: LEFT LOWER EXTREMITY VENOUS DOPPLER ULTRASOUND TECHNIQUE: Gray-scale sonography with graded compression, as well as color Doppler and duplex ultrasound were performed to evaluate the lower extremity  deep venous systems from the level of the common femoral vein and including the common femoral, femoral, profunda femoral, popliteal and calf veins including the posterior tibial, peroneal and gastrocnemius veins when visible. The superficial great saphenous vein was also interrogated. Spectral Doppler was utilized to evaluate flow at rest and with distal augmentation maneuvers in the common femoral, femoral and popliteal veins. COMPARISON:  None. FINDINGS: Contralateral Common Femoral Vein: Respiratory phasicity is normal and symmetric with the symptomatic side. No evidence of thrombus. Normal compressibility. Common Femoral Vein: No evidence of thrombus. Normal compressibility, respiratory phasicity and response to augmentation. Saphenofemoral Junction: No evidence of thrombus. Normal compressibility and flow on color Doppler imaging. Profunda Femoral Vein: No evidence of thrombus. Normal compressibility and flow on color Doppler imaging. Femoral Vein: No evidence of thrombus. Normal compressibility, respiratory phasicity and response to augmentation. Popliteal Vein: No evidence of thrombus. Normal compressibility, respiratory phasicity and response to augmentation. Calf Veins: No evidence of thrombus. Normal compressibility and flow on color Doppler imaging. Superficial Great Saphenous Vein: No evidence of thrombus. Normal compressibility. Venous Reflux:  None. Other Findings: There is a minimal amount of subcutaneous edema at the level of the left lower leg. IMPRESSION: No evidence of DVT within the left lower extremity. Electronically Signed   By: Sandi Mariscal M.D.   On: 01/15/2017 16:53     Medications:   . sodium chloride     . acyclovir  400 mg Oral q morning - 10a  . amLODipine  10 mg Oral Daily  . aspirin  325 mg Oral Daily  . calcitRIOL  0.25 mcg Oral Daily  . calcium acetate  1,334 mg Oral BID BM   And  . calcium acetate  2,001 mg Oral TID WC  . Difluprednate  1 drop Left Eye BID  .  ferrous sulfate  325 mg Oral Q breakfast  . gabapentin  100 mg Oral TID  . Ganciclovir  1 application Left Eye QHS  . Ibrutinib  1 capsule Oral QPC supper  . insulin aspart  0-9 Units Subcutaneous TID WC  . insulin detemir  14 Units Subcutaneous Q2200  . methylphenidate  5 mg Oral Daily  . methylPREDNISolone (SOLU-MEDROL) injection  60 mg Intravenous Q24H  . metoCLOPramide  5 mg Oral 2 times per day on Sun Tue Thu Sat   And  . metoCLOPramide  5 mg Oral Q M,W,F  . [START ON 01/20/2017] metoprolol tartrate  25 mg Oral Q Sat   And  . [START ON 01/20/2017] metoprolol tartrate  25 mg Oral Q Sat  . [START ON 01/19/2017] metoprolol tartrate  25 mg Oral Q Fri   And  . metoprolol tartrate  25 mg Oral Q Tue  . multivitamin  1 tablet Oral QHS  . nystatin cream  1 application Topical BID  . pravastatin  10 mg Oral q1800  . silver sulfADIAZINE  1 application Topical Daily  . sodium chloride flush  3 mL Intravenous  Q12H   sodium chloride, acetaminophen **OR** acetaminophen, fluticasone, ipratropium-albuterol, ondansetron **OR** ondansetron (ZOFRAN) IV, senna-docusate, sodium chloride flush  Assessment/ Plan:  [name]. is a 70 y.o. black male with end stage renal disease on hemodialysis, CLL, diabetes mellitus type II, history of bladder cancer, TIA  [facility] MWF [facility] AVF  1. End Stage Renal Disease:  Seen and examined hemodialysis EDW 94Kg  Continue MWF schedule.   2. Hypertension: blood pressure elevated.  - amlodipine and metoprolol.   3. Anemia of chronic kidney disease: with history of CLL on ibrutinib. Hemoglobin 8.2 - EPO with treatment.   4. Secondary Hyperparathyroidism: with hyperphosphatemia: PTH 422, calcium 8.1 and phosphorus 5.9 - PO calcitriol - calcium acetate with meals.    LOS: 0 Abelardo Seidner 10/17/201810:57 AM

## 2017-01-17 NOTE — Progress Notes (Signed)
Qualified patient for home 02.  Patient is very deconditioned.  He was unable to walk more than 6 feet before he had to sit down and rest.  Had maximum 2 person assist with walker to even walk those 6 feet.  Spoke to Dr. Darvin Neighbours.  Will reconsult PT to evaluate for possible STR.

## 2017-01-17 NOTE — Progress Notes (Signed)
F/u bp

## 2017-01-17 NOTE — Care Management (Signed)
Per Dr. Darvin Neighbours he requested patient's status change to inpatient with [facility] 01/16/17. Patient is now inpatient. I do not see a review on 01/16/17.

## 2017-01-17 NOTE — Progress Notes (Signed)
Tx initiated

## 2017-01-17 NOTE — Progress Notes (Signed)
[place] [facility] [place] NAME: Mark [name]    MR#:  [id number]  DATE OF BIRTH:  [birth year]  SUBJECTIVE:  CHIEF COMPLAINT:   Chief Complaint  Patient presents with  . Shortness of Breath   SOB slowly improving. Still on O2.  REVIEW OF SYSTEMS:    Review of Systems  Constitutional: Positive for malaise/fatigue. Negative for chills and fever.  HENT: Negative for sore throat.   Eyes: Negative for blurred vision, double vision and pain.  Respiratory: Positive for cough, shortness of breath and wheezing. Negative for hemoptysis.   Cardiovascular: Negative for chest pain, palpitations, orthopnea and leg swelling.  Gastrointestinal: Negative for abdominal pain, constipation, diarrhea, heartburn, nausea and vomiting.  Genitourinary: Negative for dysuria and hematuria.  Musculoskeletal: Negative for back pain and joint pain.  Skin: Negative for rash.  Neurological: Positive for weakness. Negative for sensory change, speech change, focal weakness and headaches.  Endo/Heme/Allergies: Does not bruise/bleed easily.  Psychiatric/Behavioral: Negative for depression. The patient is not nervous/anxious.     DRUG ALLERGIES:   Allergies  Allergen Reactions  . Ibuprofen     DUE TO DIALYSIS  . Multivitamin [Centrum]     DUE TO DIALYSIS  . Daypro [Oxaprozin] Swelling and Rash    Other reaction(s): Other (See Comments)  . Tape Rash    VITALS:  Blood pressure (!) 167/51, pulse 88, temperature 98 F (36.7 C), temperature source Oral, resp. rate 16, height 5\' 10"  (1.778 m), weight 92.3 kg (203 lb 7.8 oz), SpO2 93 %.  PHYSICAL EXAMINATION:   Physical Exam  GENERAL:  71 y.o.-year-old patient lying in the bed with no acute distress.  EYES: Pupils equal, round, reactive to light and accommodation. No scleral icterus. Extraocular muscles intact.  HEENT: Head atraumatic, normocephalic. Oropharynx and nasopharynx clear.  NECK:  Supple, no jugular venous  distention. No thyroid enlargement, no tenderness.  LUNGS: decreased air entry. Bilateral wheezing. CARDIOVASCULAR: S1, S2 normal. No murmurs, rubs, or gallops.  ABDOMEN: Soft, nontender, nondistended. Bowel sounds present. No organomegaly or mass.  EXTREMITIES: No cyanosis, clubbing or edema b/l.    NEUROLOGIC: Cranial nerves II through XII are intact. No focal Motor or sensory deficits b/l.   PSYCHIATRIC: The patient is alert and oriented x 3.  SKIN: No obvious rash, lesion, or ulcer.   LABORATORY PANEL:   CBC  Recent Labs Lab 01/17/17 0747  WBC 5.6  HGB 8.1*  HCT 23.6*  PLT 136*   ------------------------------------------------------------------------------------------------------------------ Chemistries   Recent Labs Lab 01/17/17 0747  NA 131*  K 4.9  CL 91*  CO2 26  GLUCOSE 228*  BUN 62*  CREATININE 7.01*  CALCIUM 8.9   ------------------------------------------------------------------------------------------------------------------  Cardiac Enzymes  Recent Labs Lab 01/15/17 0045  TROPONINI <0.03   ------------------------------------------------------------------------------------------------------------------  RADIOLOGY:  No results found.   ASSESSMENT AND PLAN:   * uncontrolled diabetes mellitus with hypoglycemia BS improved. High today due to steroids  * Acute hypoxic respiratory failure due to pulmonary edema secondary to end-stage renal disease Patient had hemodialysis today Appreciate nephrology input. Fluid restriction  * Acute bronchitis. Started IV Solu-Medrol and nebulizers.  * DVT prophylaxis with heparin  All the records are reviewed and case discussed with Care Management/Social Workerr. Management plans discussed with the patient, family and they are in agreement.  CODE STATUS: FULL CODE  DVT Prophylaxis: SCDs  TOTAL CC TIME TAKING CARE OF THIS PATIENT: 35 minutes.   Worked with PT. Weak. Needs SNF  Alexis Mizuno, Artist R  M.D  on 01/17/2017 at 7:35 PM  Between 7am to 6pm - Pager - [phone]  After 6pm go to [email or web address] - password [email or web address] [facility]  Office  [phone]  CC: Primary care physician; Marden Noble, MD  Note: This dictation was prepared with Dragon dictation along with smaller phrase technology. Any transcriptional errors that result from this process are unintentional.

## 2017-01-17 NOTE — Progress Notes (Signed)
Physical Therapy Treatment Patient Details Name: Mark [name]. MRN: [id number] DOB: [birth year] Today's Date: 01/17/2017    History of Present Illness Pt admitted for fluid overload. History includes HD (MWF), GERD, DM, and HTN. Pt currently with complaints of SOB symptoms.     PT Comments    Pt presents with deficits in strength, transfers, mobility, gait, balance, and activity tolerance.  Pt required min A with transfers from elevated EOB with transfers effortful and with min instability upon initial stand.  Pt reported feeling as if LEs could buckle in standing so +2 assistance utilized during this session.  Pt able to amb 1 x 15-20' and 1 x 8' with RW and close CGA with w/c follow for safety. Very slow cadence with gait with flexed trunk posture and step-to gait pattern that progressed to beginning reciprocal pattern but with decreased LLE step length.  SpO2 on 2LO2/min decreased to 86-87% after amb but increased back to baseline of low 90s after sitting 30-45 sec.  Education provided regarding breathing technique and energy conservation.  Pt will benefit from PT services in a SNF setting upon discharge to safely address above deficits for decreased caregiver assistance and eventual return to PLOF.        Follow Up Recommendations  SNF     Equipment Recommendations  None recommended by PT    Recommendations for Other Services       Precautions / Restrictions Precautions Precautions: Fall Required Braces or Orthoses: Other Brace/Splint Other Brace/Splint: Pt wears AFO on RLE Restrictions Weight Bearing Restrictions: No    Mobility  Bed Mobility               General bed mobility comments: NT, received in sitting  Transfers Overall transfer level: Needs assistance Equipment used: Rolling walker (2 wheeled) Transfers: Sit to/from Stand Sit to Stand: Min assist         General transfer comment: Pt with min instability and difficulty standing from elevated  EOB with min A required to stand and to prevent LOB.  Ambulation/Gait Ambulation/Gait assistance: Min guard;+2 physical assistance Ambulation Distance (Feet): 20 Feet Assistive device: Rolling walker (2 wheeled) Gait Pattern/deviations: Step-to pattern;Decreased step length - left;Trunk flexed   Gait velocity interpretation: at or above normal speed for age/gender General Gait Details: Very slow cadence with gait with flexed trunk posture and step-to gait pattern that progressed to beginning reciprocal pattern but with decreased LLE step length.  SpO2 on 2LO2/min decreased to 86-87% after amb but increased back to baseline of low 90s after sitting 30-45 sec with cues for breathing technique.  W/c follow for safety with pt reporting feeling like legs could buckle.     Stairs            Wheelchair Mobility    Modified Rankin (Stroke Patients Only)       Balance Overall balance assessment: Needs assistance Sitting-balance support: Feet supported;Bilateral upper extremity supported Sitting balance-Leahy Scale: Good     Standing balance support: Bilateral upper extremity supported Standing balance-Leahy Scale: Fair                              Cognition Arousal/Alertness: Awake/alert Behavior During Therapy: WFL for tasks assessed/performed Overall Cognitive Status: Within Functional Limits for tasks assessed  Exercises Total Joint Exercises Long Arc Quad: AROM;Both;10 reps;15 reps Knee Flexion: AROM;Both;10 reps;15 reps Marching in Standing: AROM;Both;5 reps Other Exercises Other Exercises: B seated hip flex x 10    General Comments        Pertinent Vitals/Pain Pain Assessment: No/denies pain    Home Living                      Prior Function            PT Goals (current goals can now be found in the care plan section) Progress towards PT goals: Not progressing toward goals -  comment (Limited by fatigue, weakness, poor activity tolerance)    Frequency    Min 2X/week      PT Plan Discharge plan needs to be updated    Co-evaluation              AM-PAC PT "6 Clicks" Daily Activity  Outcome Measure                   End of Session Equipment Utilized During Treatment: Gait belt;Oxygen (RLE AFO) Activity Tolerance: Patient limited by fatigue Patient left: in chair;with chair alarm set;with family/visitor present;with call bell/phone within reach Nurse Communication: Mobility status PT Visit Diagnosis: Muscle weakness (generalized) (M62.81);Unsteadiness on feet (R26.81);Difficulty in walking, not elsewhere classified (R26.2)     Time: 7353-2992 PT Time Calculation (min) (ACUTE ONLY): 32 min  Charges:  $Gait Training: 8-22 mins $Therapeutic Activity: 8-22 mins                    G Codes:       DRoyetta Asal PT, DPT 01/17/17, 5:01 PM

## 2017-01-17 NOTE — Progress Notes (Signed)
Physical therapist will recommend STR for patient.  Dr. Darvin Neighbours paged to give update and discharge cancelled for today.

## 2017-01-17 NOTE — Progress Notes (Signed)
SATURATION QUALIFICATIONS: (This note is used to comply with regulatory documentation for home oxygen)  Patient Saturations on Room Air at Rest = 88%  Patient Saturations on Room Air while Ambulating = 84%  Patient Saturations on 2L Liters of oxygen while Ambulating = 93%  Please briefly explain why patient needs home oxygen: COPD

## 2017-01-17 NOTE — Progress Notes (Signed)
TX complete

## 2017-01-17 NOTE — Discharge Instructions (Signed)
Resume diet and activity as before ° ° °

## 2017-01-17 NOTE — Progress Notes (Signed)
Post HD assessment  

## 2017-01-18 LAB — GLUCOSE, CAPILLARY
GLUCOSE-CAPILLARY: 261 mg/dL — AB (ref 65–99)
GLUCOSE-CAPILLARY: 339 mg/dL — AB (ref 65–99)
GLUCOSE-CAPILLARY: 442 mg/dL — AB (ref 65–99)
Glucose-Capillary: 212 mg/dL — ABNORMAL HIGH (ref 65–99)
Glucose-Capillary: 256 mg/dL — ABNORMAL HIGH (ref 65–99)

## 2017-01-18 MED ORDER — GABAPENTIN 100 MG PO CAPS: 100.0000 mg | ORAL_CAPSULE | Freq: Two times a day (BID) | ORAL | Status: DC

## 2017-01-18 MED ORDER — HYDRALAZINE HCL 25 MG PO TABS: 25.0000 mg | ORAL_TABLET | Freq: Three times a day (TID) | ORAL | Status: DC

## 2017-01-18 MED ORDER — INSULIN DETEMIR 100 UNIT/ML ~~LOC~~ SOLN
25.0000 [IU] | Freq: Every day | SUBCUTANEOUS | Status: DC
  Filled 2017-01-18: qty 0.25

## 2017-01-18 MED ORDER — HYDRALAZINE HCL 25 MG PO TABS: 25.0000 mg | ORAL_TABLET | Freq: Three times a day (TID) | ORAL | 0 refills | Status: DC

## 2017-01-18 MED ORDER — ACYCLOVIR 200 MG PO CAPS
400.0000 mg | ORAL_CAPSULE | Freq: Every day | ORAL | Status: DC
  Filled 2017-01-18: qty 2

## 2017-01-18 MED ORDER — ALBUTEROL SULFATE HFA 108 (90 BASE) MCG/ACT IN AERS: 2.0000 | INHALATION_SPRAY | RESPIRATORY_TRACT | 2 refills | Status: DC | PRN

## 2017-01-18 MED ORDER — IPRATROPIUM-ALBUTEROL 0.5-2.5 (3) MG/3ML IN SOLN: 3.0000 mL | RESPIRATORY_TRACT | Status: DC | PRN

## 2017-01-18 MED ORDER — ASPIRIN EC 81 MG PO TBEC: 81.0000 mg | DELAYED_RELEASE_TABLET | Freq: Every day | ORAL | Status: DC

## 2017-01-18 MED ORDER — HYDRALAZINE HCL 25 MG PO TABS
25.0000 mg | ORAL_TABLET | Freq: Three times a day (TID) | ORAL | Status: DC
  Administered 2017-01-18: 25 mg via ORAL
  Filled 2017-01-18: qty 1

## 2017-01-18 NOTE — Progress Notes (Signed)
Pt and family instructed on discharge instructions and medications and verbalized understanding, pt asked about oral steroid for discharge, Dr. Darvin Neighbours called to clarify since med not listed on pts discharge med rec, Per Dr. Darvin Neighbours, pt not to be discharged on oral steroid since pt no longer wheezing anymore and per Dr. Darvin Neighbours pts new prescriptions (albuterol and hydralazine) e-scribed to pharmacy listed in chart, which is [facility] in [place]. Pt informed of where prescriptions sent to and verbalized understanding. Pts home medications retrieved from pharmacy and given directly to pt to include two eye medications (an eyedrop solution and  An eye ointment) and pts cancer medication. Pt signed off on receiving medications. IV catheter discontinued, tip intact, telemetry monitor removed, pt awaiting daughter for pickup for discharge. Harlene Ramus, RN

## 2017-01-18 NOTE — Progress Notes (Signed)
SATURATION QUALIFICATIONS: (This note is used to comply with regulatory documentation for home oxygen)  Patient Saturations on Room Air at Rest =91%  Patient Saturations on Room Air while Ambulating =86 %  Patient Saturations on 2 Liters of oxygen while Ambulating = 96%  Please briefly explain why patient needs home oxygen: Pt needs oxygen to maintain o2 sats.

## 2017-01-18 NOTE — Clinical Social Work Note (Signed)
CSW received referral for SNF.  Case discussed with case manager and plan is to discharge home with home health.  CSW to sign off please re-consult if social work needs arise.  Sherlock Nancarrow R. Takeysha Bonk, MSW, LCSWA [phone]  

## 2017-01-18 NOTE — Progress Notes (Addendum)
01/17/2017  2115: Patient blood glucose of 466 was reported to Dr. Aletha Halim. Additional 5units of regular insulin PIV was administered along with 14 units of Levemir. Patient' blood glucose follow up of 478 was called to Dr. Jannifer Franklin, another 5 units of regular insulin IV was administered per order.  01/18/2017 0442: Patient BG now 442, no new order received.   01/18/2017 0430: Dr. Marcille Blanco informed of patient's blood glucose, no new order received.

## 2017-01-18 NOTE — Progress Notes (Signed)
Physical Therapy Treatment Patient Details Name: Mark [name]. MRN: [id number] DOB: [birth year] Today's Date: 01/18/2017    History of Present Illness Pt admitted for fluid overload. History includes HD (MWF), GERD, DM, and HTN. Pt currently with complaints of SOB symptoms.     PT Comments    BP checked prior to session 171/63.  Nsg requested qualifying O2 sats.  O2 on 2 lpm 99%.  O2 removed and sats decreased to 91% P 89.  Pt was able to stand and ambulate 40' x 2 with heavy reliqnce on walker with min guard/min a x 1 with wheelchair follow for safety.  O2 sats decreased to 86% P 122.  Which qualifies him for home O2.  Pt became agitated at end of session when discussing discharge plan.  While pt did ambulate better this session with increased distance, he admits to continued feeling of weakness which he attributes to dialysis yesterday.  Stated he lives with wife but she is unable to provide physical assistance to him.  He continues to require +1 assist for safety but is resistant to rehab stating he will receive therapy at home.  Stated wife and daughter want him to go to SNF but he refuses to discuss during session.  States he will be fine at home. Did not discuss further with pt as agitation was increasing "See, you got my blood pressure up there again."   Follow Up Recommendations  SNF     Equipment Recommendations  Rolling walker with 5" wheels    Recommendations for Other Services       Precautions / Restrictions Precautions Precautions: Fall Required Braces or Orthoses: Other Brace/Splint Other Brace/Splint: Pt wears AFO on RLE Restrictions Weight Bearing Restrictions: No    Mobility  Bed Mobility               General bed mobility comments: NT, received in sitting  Transfers Overall transfer level: Needs assistance Equipment used: Rolling walker (2 wheeled) Transfers: Sit to/from Stand Sit to Stand: Min assist             Ambulation/Gait Ambulation/Gait assistance: Min assist;Min guard Ambulation Distance (Feet): 40 Feet Assistive device: Rolling walker (2 wheeled) Gait Pattern/deviations: Step-through pattern   Gait velocity interpretation: <1.8 ft/sec, indicative of risk for recurrent falls General Gait Details: 40' x 2 with heavy reliance on RW   Stairs            Wheelchair Mobility    Modified Rankin (Stroke Patients Only)       Balance Overall balance assessment: Needs assistance Sitting-balance support: Feet supported;Bilateral upper extremity supported Sitting balance-Leahy Scale: Good     Standing balance support: Bilateral upper extremity supported Standing balance-Leahy Scale: Fair                              Cognition Arousal/Alertness: Awake/alert Behavior During Therapy: WFL for tasks assessed/performed Overall Cognitive Status: Within Functional Limits for tasks assessed                                        Exercises      General Comments        Pertinent Vitals/Pain Pain Assessment: No/denies pain    Home Living  Prior Function            PT Goals (current goals can now be found in the care plan section) Progress towards PT goals: Progressing toward goals    Frequency    Min 2X/week      PT Plan Current plan remains appropriate    Co-evaluation              AM-PAC PT "6 Clicks" Daily Activity  Outcome Measure  Difficulty turning over in bed (including adjusting bedclothes, sheets and blankets)?: None Difficulty moving from lying on back to sitting on the side of the bed? : None Difficulty sitting down on and standing up from a chair with arms (e.g., wheelchair, bedside commode, etc,.)?: Unable Help needed moving to and from a bed to chair (including a wheelchair)?: A Little Help needed walking in hospital room?: A Little Help needed climbing 3-5 steps with a railing? : A  Little 6 Click Score: 18    End of Session Equipment Utilized During Treatment: Gait belt Activity Tolerance: Patient limited by fatigue Patient left: in chair;with chair alarm set;with call bell/phone within reach Nurse Communication: Other (comment)       Time: 3419-6222 PT Time Calculation (min) (ACUTE ONLY): 23 min  Charges:  $Gait Training: 23-37 mins                    G Codes:       Chesley Noon, PTA 01/18/17, 11:52 AM

## 2017-01-18 NOTE — Progress Notes (Signed)
[facility]  ROUNDING NOTE   Subjective:   Hemodialysis treatment yesterday. Tolerated treatment well. UF of 3.5 liters.   Patient worked with physical therapy yesterday who recommended however patient wants to be reevaluated as he says he is stronger today.   Objective:  Vital signs in last 24 hours:  Temp:  [98.2 F (36.8 C)-98.3 F (36.8 C)] 98.2 F (36.8 C) (10/18 0418) Pulse Rate:  [71-107] 107 (10/18 0912) Resp:  [13-20] 18 (10/18 0912) BP: (145-195)/(51-107) 195/70 (10/18 0912) SpO2:  [89 %-100 %] 96 % (10/18 0912) Weight:  [92.3 kg (203 lb 7.8 oz)-93.2 kg (205 lb 6.4 oz)] 93.2 kg (205 lb 6.4 oz) (10/18 0418)  Weight change: 2.554 kg (5 lb 10.1 oz) Filed Weights   01/17/17 0930 01/17/17 1315 01/18/17 0418  Weight: 97.9 kg (215 lb 13.3 oz) 92.3 kg (203 lb 7.8 oz) 93.2 kg (205 lb 6.4 oz)    Intake/Output: I/O last 3 completed shifts: In: -  Out: 375 [Urine:375]   Intake/Output this shift:  Total I/O In: -  Out: 50 [Urine:50]  Physical Exam: General: NAD, laying in bed  Head: Normocephalic, atraumatic. Moist oral mucosal membranes  Eyes: Anicteric, PERRL  Neck: Supple, trachea midline  Lungs:  Scant wheezing.   Heart: Regular rate and rhythm  Abdomen:  Soft, nontender,   Extremities:  no peripheral edema.  Neurologic: Nonfocal, moving all four extremities  Skin: No lesions  Access: Left AVF    Basic Metabolic Panel:  Recent Labs Lab 01/15/17 0045 01/15/17 1000 01/17/17 0747  NA 131* 132* 131*  K 4.6 4.3 4.9  CL 94* 95* 91*  CO2 23 25 26   GLUCOSE 284* 210* 228*  BUN 46* 53* 62*  CREATININE 7.00* 7.80* 7.01*  CALCIUM 8.5* 8.3* 8.9  PHOS  --  4.8* 3.7    Liver Function Tests:  Recent Labs Lab 01/15/17 1000 01/17/17 0747  ALBUMIN 3.7 4.2   No results for input(s): LIPASE, AMYLASE in the last 168 hours. No results for input(s): AMMONIA in the last 168 hours.  CBC:  Recent Labs Lab 01/15/17 0045 01/15/17 1000  01/17/17 0747  WBC 5.9 3.9 5.6  NEUTROABS 2.7  --   --   HGB 9.2* 10.4* 8.1*  HCT 26.7* 30.1* 23.6*  MCV 100.2* 99.3 100.1*  PLT 148* 99* 136*    Cardiac Enzymes:  Recent Labs Lab 01/15/17 0045  TROPONINI <0.03    BNP: Invalid input(s): POCBNP  CBG:  Recent Labs Lab 01/17/17 2103 01/17/17 2244 01/18/17 0014 01/18/17 0416 01/18/17 0746  GLUCAP 466* 478* 35* 23* 212*    Microbiology: Results for orders placed or performed during the hospital encounter of 01/15/17  MRSA PCR Screening     Status: None   Collection Time: 01/15/17  4:42 AM  Result Value Ref Range Status   MRSA by PCR NEGATIVE NEGATIVE Final    Comment:        The GeneXpert MRSA Assay (FDA approved for NASAL specimens only), is one component of a comprehensive MRSA colonization surveillance program. It is not intended to diagnose MRSA infection nor to guide or monitor treatment for MRSA infections.     Coagulation Studies: No results for input(s): LABPROT, INR in the last 72 hours.  Urinalysis: No results for input(s): COLORURINE, LABSPEC, PHURINE, GLUCOSEU, HGBUR, BILIRUBINUR, KETONESUR, PROTEINUR, UROBILINOGEN, NITRITE, LEUKOCYTESUR in the last 72 hours.  Invalid input(s): APPERANCEUR    Imaging: No results found.   Medications:   . sodium chloride     .  acyclovir  400 mg Oral q morning - 10a  . amLODipine  10 mg Oral Daily  . aspirin  325 mg Oral Daily  . calcitRIOL  0.25 mcg Oral Daily  . calcium acetate  1,334 mg Oral BID BM   And  . calcium acetate  2,001 mg Oral TID WC  . Difluprednate  1 drop Left Eye BID  . epoetin (EPOGEN/PROCRIT) injection  10,000 Units Intravenous Q M,W,F-HD  . ferrous sulfate  325 mg Oral Q breakfast  . gabapentin  100 mg Oral TID  . Ganciclovir  1 application Left Eye QHS  . Ibrutinib  1 capsule Oral QPC supper  . insulin aspart  0-15 Units Subcutaneous TID AC & HS  . insulin detemir  25 Units Subcutaneous Q2200  . methylphenidate  5 mg Oral  Daily  . metoCLOPramide  5 mg Oral TID AC  . [START ON 01/20/2017] metoprolol tartrate  25 mg Oral Q Sat   And  . [START ON 01/20/2017] metoprolol tartrate  25 mg Oral Q Sat  . [START ON 01/19/2017] metoprolol tartrate  25 mg Oral Q Fri   And  . metoprolol tartrate  25 mg Oral Q Tue  . multivitamin  1 tablet Oral QHS  . nystatin cream  1 application Topical BID  . pravastatin  10 mg Oral q1800  . silver sulfADIAZINE  1 application Topical Daily  . sodium chloride flush  3 mL Intravenous Q12H   sodium chloride, acetaminophen **OR** acetaminophen, fluticasone, ipratropium-albuterol, ondansetron **OR** ondansetron (ZOFRAN) IV, senna-docusate, sodium chloride flush  Assessment/ Plan:  [name]. is a 71 y.o. black male with end stage renal disease on hemodialysis, CLL, diabetes mellitus type II, history of bladder cancer, TIA  [facility] MWF [facility] AVF  1. End Stage Renal Disease:   EDW 94Kg  Continue MWF schedule.   2. Hypertension: blood pressure elevated.  - amlodipine and metoprolol. May need a third agent.   3. Anemia of chronic kidney disease: with history of CLL on ibrutinib. Hemoglobin 8.1 - EPO with treatment. Cleared with hematology.   4. Secondary Hyperparathyroidism: with hyperphosphatemia: PTH 422, calcium 8.1 and phosphorus 5.9 - discontinue calcitriol - calcium acetate with meals.    LOS: 1 Mark [name] 10/18/201811:01 AM

## 2017-01-18 NOTE — Discharge Summary (Addendum)
[facility] [place] NAME: Mark [name]    MR#:  [id number]  DATE OF BIRTH:  [birth year]  DATE OF ADMISSION:  01/15/2017 ADMITTING PHYSICIAN: Saundra Shelling, MD  DATE OF DISCHARGE: No discharge date for patient encounter.  PRIMARY CARE PHYSICIAN: Marden Noble, MD   ADMISSION DIAGNOSIS:  sob  DISCHARGE DIAGNOSIS:  Active Problems:   Fluid overload   SECONDARY DIAGNOSIS:   Past Medical History:  Diagnosis Date  . Anemia   . Arthritis   . Bladder cancer ([place])   . Chronic kidney disease   . Diabetes mellitus without complication ([place])   . Dialysis patient [facility])    M,W,F  . Dyspnea    DOE  . GERD (gastroesophageal reflux disease)   . HOH (hard of hearing)   . Hypertension   . IBS (irritable bowel syndrome)   . Lymphoma ([place]) 09/27/2014  . Neuropathy    RIGHT LEG  . Stroke [facility])    TIA     ADMITTING HISTORY  HISTORY OF PRESENT ILLNESS: Mark [name]  is a 71 y.o. male with a known history of End-stage renal disease on dialysis, diabetes mellitus type 2, arthritis, chronic anemia, GERD, hypertension, lymphoma, neuropathy presented to the emergency room with increased shortness of breath. Patient O2 sats on room air were 87%. Patient does not use any home oxygen. Patient gets dialyzed on Monday venison Friday. He did not miss any dialysis. Patient has noticed increased swelling in the legs from fluid retention for the last couple of days. Chest x-ray revealed interstitial edema with fluid overload. Hospitalist service was consulted. Patient still makes urine and IV Lasix was given in the emergency room for diuresis.  HOSPITAL COURSE:    * Uncontrolled diabetes mellitus with hypoglycemia BS improved Continue insulin from home and accucheks ABH.  * Acute hypoxic respiratory failure due to pulmonary edema secondary to end-stage renal disease Patient had hemodialysis yesterday Appreciate nephrology input. Fluid  restriction  * Acute bronchitis with  COPD exacerbation. Started IV Solu-Medrol and nebulizers. Wheezing has stopped Stopped steroids Albuterol inhaler at discharge Needs Outpatient PFTs with pulmonary for copd  Stable for discharge to SNF  CONSULTS OBTAINED:  Treatment Team:  Lavonia Dana, MD  DRUG ALLERGIES:   Allergies  Allergen Reactions  . Ibuprofen     DUE TO DIALYSIS  . Multivitamin [Centrum]     DUE TO DIALYSIS  . Daypro [Oxaprozin] Swelling and Rash    Other reaction(s): Other (See Comments)  . Tape Rash    DISCHARGE MEDICATIONS:   Current Discharge Medication List    START taking these medications   Details  albuterol (PROVENTIL HFA;VENTOLIN HFA) 108 (90 Base) MCG/ACT inhaler Inhale 2 puffs into the lungs every 6 (six) hours as needed for wheezing or shortness of breath. Qty: 1 Inhaler, Refills: 2    hydrALAZINE (APRESOLINE) 25 MG tablet Take 1 tablet (25 mg total) by mouth every 8 (eight) hours.      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     aspirin 81 MG EC tablet Take 81 mg by mouth daily.     CALCITRIOL PO Take 1  tablet by mouth daily.   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 3 caps with meals and 2 caps with snacks    Difluprednate (DUREZOL) 0.05 % EMUL Place 1 drop into the left eye 4 (four) times daily.    ENSURE PLUS (ENSURE PLUS) LIQD Take by mouth 3 (three) times a week. M,W.F    Epoetin Alfa (EPOGEN IJ) Inject 4,800 Units as directed every 21 ( twenty-one) days.   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     furosemide (LASIX) 80 MG tablet Take 80-160 mg by mouth See admin instructions. 2 tablets in am- on sat, Sunday, Tuesday and Thursday 1 tablet in PM- on Sundays -Saturdays    gabapentin (NEURONTIN) 100 MG capsule Take 1 capsule (100 mg total) by mouth 3 (three) times daily. Qty: 90 capsule, Refills: 0    glimepiride (AMARYL) 4 MG tablet Take 8 mg by mouth at bedtime.     Ibrutinib 280 MG TABS Take 1 capsule by mouth daily. 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 Depending on what he eats    lovastatin (MEVACOR) 20 MG tablet Take 1 tablet by mouth daily.   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 Patient takes 1 tab with each meal.    metoprolol tartrate (LOPRESSOR) 100 MG tablet Take 25 mg by mouth See admin instructions. Pt takes Tues morning, Friday Night, and Saturday morning and evening    multivitamin (RENA-VIT) TABS tablet Take 1 tablet by mouth at bedtime.     neomycin-polymyxin b-dexamethasone (MAXITROL) 3.5-10000-0.1 OINT Place 1 application into the left eye 2 (two) times daily.     nystatin cream (MYCOSTATIN) Apply 1 application topically 2 (two) times daily.   Associated Diagnoses: Small B-cell  lymphoma of intra-abdominal lymph nodes (HCC)    silver sulfADIAZINE (SILVADENE) 1 % cream Apply 1 application topically daily. Qty: 30 g, Refills: 2    ZIRGAN 0.15 % GEL Place 1 application into the left eye 3 (three) times daily as needed.   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 rate 18, height 5\' 10"  (1.778 m), weight 93.2 kg (205 lb 6.4 oz), SpO2 98 %.  I/O:   Intake/Output Summary (Last 24 hours) at 01/18/17 1328 Last data filed at 01/18/17 1017  Gross per 24 hour  Intake                0 ml  Output  250 ml  Net             -250 ml    PHYSICAL EXAMINATION:  Physical Exam  GENERAL:  71 y.o.-year-old patient lying in the bed with no acute distress.  LUNGS: Normal breath sounds bilaterally, no wheezing, rales,rhonchi or crepitation. No use of accessory muscles of respiration.  CARDIOVASCULAR: S1, S2 normal. No murmurs, rubs, or gallops.  ABDOMEN: Soft, non-tender, non-distended. Bowel sounds present. No organomegaly or mass.  NEUROLOGIC: Moves all 4 extremities. PSYCHIATRIC: The patient is alert and oriented x 3.  SKIN: No obvious rash, lesion, or ulcer.   DATA REVIEW:   CBC  Recent Labs Lab 01/17/17 0747  WBC 5.6  HGB 8.1*  HCT 23.6*  PLT 136*    Chemistries   Recent Labs Lab 01/17/17 0747  NA 131*  K 4.9  CL 91*  CO2 26  GLUCOSE 228*  BUN 62*  CREATININE 7.01*  CALCIUM 8.9    Cardiac Enzymes  Recent Labs Lab 01/15/17 0045  TROPONINI <0.03    Microbiology Results  Results for orders placed or performed during the hospital encounter of 01/15/17  MRSA PCR Screening     Status: None   Collection Time: 01/15/17  4:42 AM  Result Value Ref Range Status   MRSA by PCR NEGATIVE NEGATIVE Final    Comment:        The GeneXpert MRSA Assay (FDA approved for NASAL  specimens only), is one component of a comprehensive MRSA colonization surveillance program. It is not intended to diagnose MRSA infection nor to guide or monitor treatment for MRSA infections.     RADIOLOGY:  No results found.  Follow up with PCP in 1 week.  Management plans discussed with the patient, family and they are in agreement.  CODE STATUS:     Code Status Orders        Start     Ordered   01/15/17 0424  Full code  Continuous     01/15/17 0423    Code Status History    Date Active Date Inactive Code Status Order ID Comments User Context   10/20/2016  4:27 PM 10/22/2016  6:17 PM Full Code [id number]  Lance Coon, MD Inpatient    Advance Directive Documentation     Most Recent Value  Type of Advance Directive  Living will  Pre-existing out of facility DNR order (yellow form or pink MOST form)  -  "MOST" Form in Place?  -      TOTAL TIME TAKING CARE OF THIS PATIENT ON DAY OF DISCHARGE: more than 30 minutes.   Hillary Bow R M.D on 01/18/2017 at 1:28 PM  Between 7am to 6pm - Pager - [phone]  After 6pm go to [email or web address] - password [email or web address] [facility]  Office  [phone]  CC: Primary care physician; Marden Noble, MD  Note: This dictation was prepared with Dragon dictation along with smaller phrase technology. Any transcriptional errors that result from this process are unintentional.

## 2017-01-18 NOTE — Progress Notes (Signed)
[facility] at [place] NAME: Mark [name]    MR#:  [id number]  DATE OF BIRTH:  1945-07-12  SUBJECTIVE:  CHIEF COMPLAINT:   Chief Complaint  Patient presents with  . Shortness of Breath   SOB is improving  REVIEW OF SYSTEMS:    Review of Systems  Constitutional: Positive for malaise/fatigue. Negative for chills and fever.  HENT: Negative for sore throat.   Eyes: Negative for blurred vision, double vision and pain.  Respiratory: Positive for cough, shortness of breath and wheezing. Negative for hemoptysis.   Cardiovascular: Negative for chest pain, palpitations, orthopnea and leg swelling.  Gastrointestinal: Negative for abdominal pain, constipation, diarrhea, heartburn, nausea and vomiting.  Genitourinary: Negative for dysuria and hematuria.  Musculoskeletal: Negative for back pain and joint pain.  Skin: Negative for rash.  Neurological: Positive for weakness. Negative for sensory change, speech change, focal weakness and headaches.  Endo/Heme/Allergies: Does not bruise/bleed easily.  Psychiatric/Behavioral: Negative for depression. The patient is not nervous/anxious.     DRUG ALLERGIES:   Allergies  Allergen Reactions  . Ibuprofen     DUE TO DIALYSIS  . Multivitamin [Centrum]     DUE TO DIALYSIS  . Daypro [Oxaprozin] Swelling and Rash    Other reaction(s): Other (See Comments)  . Tape Rash    VITALS:  Blood pressure (!) 178/61, pulse 85, temperature 98.4 F (36.9 C), temperature source Oral, resp. rate 18, height 5\' 10"  (1.778 m), weight 93.2 kg (205 lb 6.4 oz), SpO2 98 %.  PHYSICAL EXAMINATION:   Physical Exam  GENERAL:  71 y.o.-year-old patient lying in the bed with no acute distress.  EYES: Pupils equal, round, reactive to light and accommodation. No scleral icterus. Extraocular muscles intact.  HEENT: Head atraumatic, normocephalic. Oropharynx and nasopharynx clear.  NECK:  Supple, no jugular venous distention. No  thyroid enlargement, no tenderness.  LUNGS: CTA b/l CARDIOVASCULAR: S1, S2 normal. No murmurs, rubs, or gallops.  ABDOMEN: Soft, nontender, nondistended. Bowel sounds present. No organomegaly or mass.  EXTREMITIES: No cyanosis, clubbing or edema b/l.    NEUROLOGIC: Cranial nerves II through XII are intact. No focal Motor or sensory deficits b/l.   PSYCHIATRIC: The patient is alert and oriented x 3.  SKIN: No obvious rash, lesion, or ulcer.   LABORATORY PANEL:   CBC  Recent Labs Lab 01/17/17 0747  WBC 5.6  HGB 8.1*  HCT 23.6*  PLT 136*   ------------------------------------------------------------------------------------------------------------------ Chemistries   Recent Labs Lab 01/17/17 0747  NA 131*  K 4.9  CL 91*  CO2 26  GLUCOSE 228*  BUN 62*  CREATININE 7.01*  CALCIUM 8.9   ------------------------------------------------------------------------------------------------------------------  Cardiac Enzymes  Recent Labs Lab 01/15/17 0045  TROPONINI <0.03   ------------------------------------------------------------------------------------------------------------------  RADIOLOGY:  No results found.   ASSESSMENT AND PLAN:   * uncontrolled diabetes mellitus with hypoglycemia BS high to steroids today  * Acute hypoxic respiratory failure due to pulmonary edema secondary to end-stage renal disease Patient had hemodialysis yesterday Appreciate nephrology input. Fluid restriction  * Acute bronchitis. Started IV Solu-Medrol and nebulizers. Wheezing has stopped Stop steroids Albuterol inhaler at discharge  * DVT prophylaxis with heparin  All the records are reviewed and case discussed with Care Management/Social Workerr. Management plans discussed with the patient, family and they are in agreement.  CODE STATUS: FULL CODE  DVT Prophylaxis: SCDs  TOTAL TIME TAKING CARE OF THIS PATIENT: 35 minutes.   Worked with PT. Weak. Needs SNF  Mark [name],  Artist  R M.D on 01/18/2017 at 1:26 PM  Between 7am to 6pm - Pager - [phone]  After 6pm go to [email or web address] - password [email or web address]  Office  [phone]  CC: Primary care physician; Marden Noble, MD  Note: This dictation was prepared with Dragon dictation along with smaller phrase technology. Any transcriptional errors that result from this process are unintentional.

## 2017-01-18 NOTE — Care Management (Signed)
Patient to discharge home today.  Patient declined SNF and will return home with home health.  [facility] home health Notified.  Patient has qualifies for home o2.  Jason with [facility] to deliver portable tank prior to discharge.  RNCM signing off.

## 2017-01-18 NOTE — Care Management (Signed)
Patient initiated on nebulizer during admission.  Patient still requiring O2 at discharge.  Will discharge with albuterol inhaler.

## 2017-01-22 ENCOUNTER — Telehealth: Payer: Self-pay | Admitting: Internal Medicine

## 2017-01-22 ENCOUNTER — Telehealth: Payer: Self-pay | Admitting: *Deleted

## 2017-01-22 NOTE — Telephone Encounter (Signed)
Feeling weak and tired and does not feel himself asking to be seen today or tomorrow. Appointment accepted tomorrow at 9 AM to see Lorretta Harp, NP

## 2017-01-22 NOTE — Telephone Encounter (Signed)
Please make an appt to see me in nov 1st/thursday at 1:30; labs-cbc. Thx

## 2017-01-22 NOTE — Telephone Encounter (Signed)
Colette, see message below. Thanks!

## 2017-01-23 ENCOUNTER — Other Ambulatory Visit: Payer: Self-pay | Admitting: Oncology

## 2017-01-23 ENCOUNTER — Inpatient Hospital Stay: Payer: Medicare HMO

## 2017-01-23 ENCOUNTER — Inpatient Hospital Stay (HOSPITAL_BASED_OUTPATIENT_CLINIC_OR_DEPARTMENT_OTHER): Payer: Medicare HMO | Admitting: Oncology

## 2017-01-23 ENCOUNTER — Telehealth: Payer: Self-pay | Admitting: *Deleted

## 2017-01-23 VITALS — BP 156/64 | HR 67 | Temp 97.1°F | Resp 20 | Wt 206.0 lb

## 2017-01-23 DIAGNOSIS — Z8673 Personal history of transient ischemic attack (TIA), and cerebral infarction without residual deficits: Secondary | ICD-10-CM | POA: Diagnosis not present

## 2017-01-23 DIAGNOSIS — Z79899 Other long term (current) drug therapy: Secondary | ICD-10-CM

## 2017-01-23 DIAGNOSIS — N281 Cyst of kidney, acquired: Secondary | ICD-10-CM | POA: Diagnosis not present

## 2017-01-23 DIAGNOSIS — N186 End stage renal disease: Secondary | ICD-10-CM

## 2017-01-23 DIAGNOSIS — K219 Gastro-esophageal reflux disease without esophagitis: Secondary | ICD-10-CM | POA: Diagnosis not present

## 2017-01-23 DIAGNOSIS — Z7982 Long term (current) use of aspirin: Secondary | ICD-10-CM | POA: Diagnosis not present

## 2017-01-23 DIAGNOSIS — Z87891 Personal history of nicotine dependence: Secondary | ICD-10-CM | POA: Diagnosis not present

## 2017-01-23 DIAGNOSIS — C8303 Small cell B-cell lymphoma, intra-abdominal lymph nodes: Secondary | ICD-10-CM

## 2017-01-23 DIAGNOSIS — D696 Thrombocytopenia, unspecified: Secondary | ICD-10-CM | POA: Diagnosis not present

## 2017-01-23 DIAGNOSIS — K589 Irritable bowel syndrome without diarrhea: Secondary | ICD-10-CM | POA: Diagnosis not present

## 2017-01-23 DIAGNOSIS — C8308 Small cell B-cell lymphoma, lymph nodes of multiple sites: Secondary | ICD-10-CM | POA: Diagnosis not present

## 2017-01-23 DIAGNOSIS — Z992 Dependence on renal dialysis: Secondary | ICD-10-CM | POA: Diagnosis not present

## 2017-01-23 DIAGNOSIS — I120 Hypertensive chronic kidney disease with stage 5 chronic kidney disease or end stage renal disease: Secondary | ICD-10-CM | POA: Diagnosis not present

## 2017-01-23 DIAGNOSIS — D631 Anemia in chronic kidney disease: Secondary | ICD-10-CM

## 2017-01-23 DIAGNOSIS — R5383 Other fatigue: Secondary | ICD-10-CM

## 2017-01-23 DIAGNOSIS — I2510 Atherosclerotic heart disease of native coronary artery without angina pectoris: Secondary | ICD-10-CM | POA: Diagnosis not present

## 2017-01-23 DIAGNOSIS — E1122 Type 2 diabetes mellitus with diabetic chronic kidney disease: Secondary | ICD-10-CM | POA: Diagnosis not present

## 2017-01-23 DIAGNOSIS — M21371 Foot drop, right foot: Secondary | ICD-10-CM | POA: Diagnosis not present

## 2017-01-23 DIAGNOSIS — H169 Unspecified keratitis: Secondary | ICD-10-CM | POA: Diagnosis not present

## 2017-01-23 DIAGNOSIS — Z794 Long term (current) use of insulin: Secondary | ICD-10-CM | POA: Diagnosis not present

## 2017-01-23 DIAGNOSIS — R630 Anorexia: Secondary | ICD-10-CM

## 2017-01-23 LAB — CBC WITH DIFFERENTIAL/PLATELET
BASOS ABS: 0 10*3/uL (ref 0–0.1)
Basophils Relative: 0 %
Eosinophils Absolute: 0.2 10*3/uL (ref 0–0.7)
Eosinophils Relative: 5 %
HEMATOCRIT: 21.3 % — AB (ref 40.0–52.0)
HEMOGLOBIN: 7.3 g/dL — AB (ref 13.0–18.0)
LYMPHS ABS: 1.2 10*3/uL (ref 1.0–3.6)
LYMPHS PCT: 28 %
MCH: 34.9 pg — ABNORMAL HIGH (ref 26.0–34.0)
MCHC: 34.2 g/dL (ref 32.0–36.0)
MCV: 102.1 fL — AB (ref 80.0–100.0)
Monocytes Absolute: 0.1 10*3/uL — ABNORMAL LOW (ref 0.2–1.0)
Monocytes Relative: 1 %
NEUTROS ABS: 2.7 10*3/uL (ref 1.4–6.5)
Neutrophils Relative %: 66 %
PLATELETS: 126 10*3/uL — AB (ref 150–440)
RBC: 2.09 MIL/uL — AB (ref 4.40–5.90)
RDW: 16.8 % — ABNORMAL HIGH (ref 11.5–14.5)
WBC: 4.2 10*3/uL (ref 3.8–10.6)

## 2017-01-23 LAB — COMPREHENSIVE METABOLIC PANEL
ALT: 25 U/L (ref 17–63)
AST: 18 U/L (ref 15–41)
Albumin: 3.6 g/dL (ref 3.5–5.0)
Alkaline Phosphatase: 59 U/L (ref 38–126)
Anion gap: 15 (ref 5–15)
BILIRUBIN TOTAL: 0.7 mg/dL (ref 0.3–1.2)
BUN: 47 mg/dL — AB (ref 6–20)
CHLORIDE: 95 mmol/L — AB (ref 101–111)
CO2: 24 mmol/L (ref 22–32)
CREATININE: 5.8 mg/dL — AB (ref 0.61–1.24)
Calcium: 7.9 mg/dL — ABNORMAL LOW (ref 8.9–10.3)
GFR calc Af Amer: 10 mL/min — ABNORMAL LOW (ref >60)
GFR, EST NON AFRICAN AMERICAN: 9 mL/min — AB (ref >60)
Glucose, Bld: 121 mg/dL — ABNORMAL HIGH (ref 65–99)
Potassium: 3.9 mmol/L (ref 3.5–5.1)
Sodium: 134 mmol/L — ABNORMAL LOW (ref 135–145)
Total Protein: 6.3 g/dL — ABNORMAL LOW (ref 6.5–8.1)

## 2017-01-23 NOTE — Telephone Encounter (Signed)
Wants to update on condition, doing a little better, talking and resting. Was able to eat breakfast. Asking about [place] and humidity for his O2

## 2017-01-23 NOTE — Progress Notes (Addendum)
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 Bone marrow biopsy on 03/05/13 with mildly hypercellular marrow for age 19-50% with interstitial predominantly small B-lymphocytic infiltrate estimated about 20-30% of marrow cells, mild nonspecific dyserythropoiesis, storage iron present this. Flow cytometry showed 29% CD5+ clonal B-cell population which is CD45+, CD5+, CD19+, CD20+, CD22+, CD23+, CD38+, HLA-DR+, Slg kappa+, and is CD10-, CD11b-, FMC7-. 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 PET scan on 03/03/13 with extensive hypermetabolic lymphadenopathy.  (presented with progressive Thrombocytopenia with persistent Anemia. CBC on 01/16/13 showed hemoglobin 10.0, MCV 98, platelets 80, WBC 4890 with 40% neutrophils, 54% lymphocytes, 1.4% monocytes. On Epogen and Venofer at dialysis treatments. Further workup showed small M-spike of 0.2).   Got Treanda/Rituxan x2 cycles in Dec 2014/Jan 2015. Then on single agent Rituxan q 8 weeks (got #3 dose on 09/16/13). --------------------------------------------------------------- ----------------------  # DEC 2014- SMALL CELL LYMPHOMA/CLL- STAGE IV [BMBx- 03/2013- 20-30% B cell infiltrate; CD 5+/CD 20+]; FISH- Trisomy 12;DEC 2014-Treanda-Rituxan x2 [held sec to prolonged hospital/pneumoani/HD]; Rituxa q8W [last June 2015; Held sec to Superficial Bladder ca]; CT- JUNE 2017- STABLE Mediastinal LN/supraclav LN/pelvic LN; CT DEC  2017- mild progression noted ~1.5-2cm mediastinal/Ax LN. Cont surveillance  # SEP 2018- PROGRESSION on PET scan-  # SEP 27th 2018- Ibrutinib 280 mg/day [after HD];   # 73m RLL lung nodule- F/u in 6 M [Hx of smoking quit 2015]  # ESRD on HD [Dr.Voora/Dr.Kolluru]; DM; Non-healing ulcers in feet/Hx R foot drop- walker ambulation  #Noninvasive Bladder cancer/recent UTIs-currently resolved.  Interval history- Patient was last seen in Heme/Onc by Dr. BRogue Bussingon 01/04/2017. He presented with a complaint of easy bruising bleeding from his dialysis fistula site. He noticed a bruise approximately 3 days prior. He noted mild improvement on day of visit. He is currently on ibrutinib 280 mg a day since September 27. Patient noted to have improvement of energy /appetite, however noted to have easy bruising. It was recommended to decrease his aspirin 325 mg per day to 81 mg per day. Recent imaging showed progression in neck bilaterally, pelvis [increase in right pelvic LN]; stable mediastinal LN. Patient noted to have worsening lymphadenopathy on imaging/clinically; worsening anemia/thrombocytopenia..   On 01/15/2017 patient was admitted to the hospital with a chief complaint of shortness of breath. On admission his oxygen saturations were 87%. Chest x-ray showed bilateral infiltrates. He continues to receive dialysis Monday, Wednesday and Friday for End Stage Renal Disease. He had noted an increase in leg swelling over the past few days. Chest x-ray was consistent with pulmonary edema. He was given IV Lasix. He was seen inpatient by his nephrologist Dr. KJuleen China They attempted to pull extra fluid via dialysis but patient began to cramp. He continued to feel weak for several days and required 3 L of oxygen. He had several hypoglycemic events. Was also treated for acute bronchitis and started on IV Solu-Medrol and nebulizers. Throughout his stay he continued to complain of shortness of breath although he was  being dialyzed. He qualified for home O2. He was very deconditioned and was  unable to walk more than 6 feet before he had to rest He was discharged home with home health.   Today he presents for severe fatigue, joint stiffness, easy brusing, occasional nose bleeds and lack of appetite. He noticed the symptoms began with the initiation of ibrutinib.  He noticed the fatigue was worse after being discharged from the hospital due to being bed bound for 3 days. He has attempted to "move around more" at home. He also complains of lack of appetite. He states he has no desire to eat anything. He has been forcing fluids and foods as he can trolerate.  He states that a case manageer offered him a bed at a skilled nursing facility for physical therapy but he refused because " he knows how people are treated at rest homes". He notes joint stiffness in his arms and leg that is unrelieved with Tylenol. Additionally, he complains of easy bruising. This started with initiation of his treatment. He has bruising on his arms, chest and legs. His daughters states she thinks the bruising is getting better. He admits to an occasional nosebleed which started with the initiation of oxygen. He denies any additional concerns.   ECOG FS:1 - Symptomatic but completely ambulatory  Review of systems- Review of Systems  Constitutional: Positive for malaise/fatigue. Negative for chills, fever and weight loss.  HENT: Negative.   Eyes: Positive for pain and redness.  Respiratory: Negative for cough, sputum production, shortness of breath and wheezing.   Cardiovascular: Positive for leg swelling. Negative for chest pain and palpitations.  Gastrointestinal: Negative.   Genitourinary: Negative.   Musculoskeletal: Positive for joint pain and myalgias.  Skin: Negative.   Neurological: Positive for weakness.  Endo/Heme/Allergies: Bruises/bleeds easily.  Psychiatric/Behavioral: Negative.      Current treatment- ibrutinib 280 mg a day  since September 27.  Allergies  Allergen Reactions  . Ibuprofen     DUE TO DIALYSIS  . Multivitamin [Centrum]     DUE TO DIALYSIS  . Daypro [Oxaprozin] Swelling and Rash    Other reaction(s): Other (See Comments)  . Tape Rash     Past Medical History:  Diagnosis Date  . Anemia   . Arthritis   . Bladder cancer ([place])   . Chronic kidney disease   . Diabetes mellitus without complication ([place])   . Dialysis patient [facility])    M,W,F  . Dyspnea    DOE  . GERD (gastroesophageal reflux disease)   . HOH (hard of hearing)   . Hypertension   . IBS (irritable bowel syndrome)   . Lymphoma ([place]) 09/27/2014  . Neuropathy    RIGHT LEG  . Stroke [facility])    TIA     Past Surgical History:  Procedure Laterality Date  . BACK SURGERY  2007  . CATARACT EXTRACTION W/PHACO Left 03/23/2016   Procedure: CATARACT EXTRACTION PHACO AND INTRAOCULAR LENS PLACEMENT (IOC);  Surgeon: Leandrew Koyanagi, MD;  Location: [facility] ORS;  Service: Ophthalmology;  Laterality: Left;  [place] 52.6 AP% 14.7 CDE 7.72 Fluid pack lot # [id number]  . CATARACT EXTRACTION W/PHACO Right 05/30/2016   Procedure: CATARACT EXTRACTION PHACO AND INTRAOCULAR LENS PLACEMENT ([place]);  Surgeon: Leandrew Koyanagi, MD;  Location: [facility] ORS;  Service: Ophthalmology;  Laterality: Right;  [place] 01:08 AP% 13.9 CDE 9.53  note: could not get IV in patient, so procedure done without anesthesia personell present, ok per Dr Wallace Going fluid pack lo t# [id number]  . CIRCUMCISION    . PERIPHERAL VASCULAR CATHETERIZATION Left 08/19/2015   Procedure:  A/V Shuntogram/Fistulagram;  Surgeon: Algernon Huxley, MD;  Location: [place] [facility];  Service: Cardiovascular;  Laterality: Left;  . PERIPHERAL VASCULAR CATHETERIZATION N/A 08/19/2015   Procedure: A/V Shunt Intervention;  Surgeon: Algernon Huxley, MD;  Location: [place] [facility];  Service: Cardiovascular;  Laterality: N/A;    Social History   Social History  . Marital status: Married    Spouse name: N/A    . Number of children: N/A  . Years of education: N/A   Occupational History  . retired    Social History Main Topics  . Smoking status: Former Smoker    Types: Cigarettes    Quit date: 05/19/2013  . Smokeless tobacco: Never Used     Comment: quit  . Alcohol use No  . Drug use: No  . Sexual activity: Not on file   Other Topics Concern  . Not on file   Social History Narrative  . No narrative on file    Family History  Problem Relation Age of Onset  . Cancer Mother   . Kidney cancer Neg Hx   . Prostate cancer Neg Hx   . Kidney failure Neg Hx   . Bladder Cancer Neg Hx      Current Outpatient Prescriptions:  .  acyclovir (ZOVIRAX) 400 MG tablet, One pill a day [to prevent shingles]; Take it after dialysis on the days of dialyisis, Disp: 30 tablet, Rfl: 3 .  albuterol (PROVENTIL HFA;VENTOLIN HFA) 108 (90 Base) MCG/ACT inhaler, Inhale 2 puffs into the lungs every 4 (four) hours as needed for wheezing or shortness of breath., Disp: 1 Inhaler, Rfl: 2 .  amLODipine (NORVASC) 10 MG tablet, Take 10 mg by mouth daily. , Disp: , Rfl:  .  aspirin 81 MG EC tablet, Take 81 mg by mouth daily. , Disp: , Rfl:  .  CALCITRIOL PO, Take 1 tablet by mouth daily., Disp: , Rfl:  .  calcium acetate (PHOSLO) 667 MG capsule, Take 1,334-2,001 mg by mouth See admin instructions. 3 caps with meals and 2 caps with snacks, Disp: , Rfl:  .  Difluprednate (DUREZOL) 0.05 % EMUL, Place 1 drop into the left eye 4 (four) times daily., Disp: , Rfl:  .  ENSURE PLUS (ENSURE PLUS) LIQD, Take by mouth 3 (three) times a week. M,W.F, Disp: , Rfl:  .  Epoetin Alfa (EPOGEN IJ), Inject 4,800 Units as directed every 21 ( twenty-one) days., Disp: , Rfl:  .  ferrous sulfate 325 (65 FE) MG EC tablet, Take 325 mg by mouth daily with breakfast., Disp: , Rfl:  .  fluticasone (FLONASE) 50 MCG/ACT nasal spray, Place 1 spray into both nostrils daily as needed for allergies. , Disp: , Rfl:  .  furosemide (LASIX) 80 MG tablet, Take  80-160 mg by mouth See admin instructions. 2 tablets in am- on sat, Sunday, Tuesday and Thursday 1 tablet in PM- on Sundays -Saturdays, Disp: , Rfl:  .  gabapentin (NEURONTIN) 100 MG capsule, Take 1 capsule (100 mg total) by mouth 3 (three) times daily. (Patient taking differently: Take 200-300 mg by mouth 2 (two) times daily. ), Disp: 90 capsule, Rfl: 0 .  glimepiride (AMARYL) 4 MG tablet, Take 8 mg by mouth at bedtime. , Disp: , Rfl:  .  hydrALAZINE (APRESOLINE) 25 MG tablet, Take 1 tablet (25 mg total) by mouth 3 (three) times daily., Disp: 90 tablet, Rfl: 0 .  Ibrutinib 280 MG TABS, Take 1 capsule by mouth daily., Disp: 30 tablet, Rfl:  4 .  LEVEMIR FLEXTOUCH 100 UNIT/ML Pen, Inject 25-30 Units into the skin daily at 10 pm. Depending on what he eats, Disp: , Rfl:  .  lovastatin (MEVACOR) 20 MG tablet, Take 1 tablet by mouth daily., Disp: , Rfl:  .  methylphenidate (RITALIN) 5 MG tablet, Take 1 tablet by mouth daily., Disp: , Rfl:  .  metoCLOPramide (REGLAN) 5 MG tablet, Take 5 mg by mouth 3 (three) times daily before meals. Patient takes 1 tab with each meal., Disp: , Rfl:  .  metoprolol tartrate (LOPRESSOR) 100 MG tablet, Take 25 mg by mouth See admin instructions. Pt takes Tues morning, Friday Night, and Saturday morning and evening, Disp: , Rfl:  .  multivitamin (RENA-VIT) TABS tablet, Take 1 tablet by mouth at bedtime. , Disp: , Rfl:  .  neomycin-polymyxin b-dexamethasone (MAXITROL) 3.5-10000-0.1 OINT, Place 1 application into the left eye 2 (two) times daily. , Disp: , Rfl:  .  nystatin cream (MYCOSTATIN), Apply 1 application topically 2 (two) times daily., Disp: , Rfl:  .  silver sulfADIAZINE (SILVADENE) 1 % cream, Apply 1 application topically daily., Disp: 30 g, Rfl: 2 .  ZIRGAN 0.15 % GEL, Place 1 application into the left eye 3 (three) times daily as needed., Disp: , Rfl:   Physical exam:  Vitals:   01/23/17 0938  BP: (!) 156/64  Pulse: 67  Resp: 20  Temp: (!) 97.1 F (36.2 C)    TempSrc: Tympanic  Weight: 206 lb (93.4 kg)   Physical Exam  Constitutional: He is oriented to person, place, and time and well-developed, well-nourished, and in no distress.  HENT:  Head: Normocephalic and atraumatic.  Eyes: Pupils are equal, round, and reactive to light. Right conjunctiva has no hemorrhage. Left conjunctiva has a hemorrhage.  Neck: Normal range of motion. Neck supple.  Cardiovascular: Normal rate, regular rhythm and normal heart sounds.   Pulmonary/Chest: Effort normal and breath sounds normal.  Abdominal: Soft. Bowel sounds are normal.  Musculoskeletal: Normal range of motion. He exhibits edema.       Left ankle: He exhibits swelling.  Neurological: He is alert and oriented to person, place, and time.  Skin: Skin is warm and dry.     CMP Latest Ref Rng & Units 01/23/2017  Glucose 65 - 99 mg/dL 121(H)  BUN 6 - 20 mg/dL 47(H)  Creatinine 0.61 - 1.24 mg/dL 5.80(H)  Sodium 135 - 145 mmol/L 134(L)  Potassium 3.5 - 5.1 mmol/L 3.9  Chloride 101 - 111 mmol/L 95(L)  CO2 22 - 32 mmol/L 24  Calcium 8.9 - 10.3 mg/dL 7.9(L)  Total Protein 6.5 - 8.1 g/dL 6.3(L)  Total Bilirubin 0.3 - 1.2 mg/dL 0.7  Alkaline Phos 38 - 126 U/L 59  AST 15 - 41 U/L 18  ALT 17 - 63 U/L 25   CBC Latest Ref Rng & Units 01/23/2017  WBC 3.8 - 10.6 K/uL 4.2  Hemoglobin 13.0 - 18.0 g/dL 7.3(L)  Hematocrit 40.0 - 52.0 % 21.3(L)  Platelets 150 - 440 K/uL 126(L)    No images are attached to the encounter.  Dg Chest 2 View  Result Date: 01/15/2017 CLINICAL DATA:  Acute onset shortness of breath. EXAM: CHEST  2 VIEW COMPARISON:  Chest 10/20/2016.  CT chest 11/30/2016. FINDINGS: Shallow inspiration. Cardiac enlargement. Prominent right and left paratracheal stripe likely corresponding to known mediastinal lymphadenopathy. There is new perihilar infiltration, greater on the left. This may be due to edema or pneumonia. Blunting of the costophrenic angles suggesting  small pleural effusions. Small  amount of fluid in the fissures. No pneumothorax. Calcification of the aorta. IMPRESSION: 1. Prominent paratracheal soft tissue corresponding to known mediastinal lymphadenopathy. 2. Mild cardiac enlargement. 3. Perihilar infiltrates, greater on the left, with small bilateral pleural effusions. Changes may represent edema or pneumonia. 4. Aortic atherosclerosis. Electronically Signed   By: Lucienne Capers M.D.   On: 01/15/2017 01:16   US Venous Img Lower Unilateral Left  Result Date: 01/15/2017 CLINICAL DATA:  Left lower extremity pain and edema for the past 3 weeks. Shortness of breath. History of bladder cancer and lymphoma. Evaluate for DVT. EXAM: LEFT LOWER EXTREMITY VENOUS DOPPLER ULTRASOUND TECHNIQUE: Gray-scale sonography with graded compression, as well as color Doppler and duplex ultrasound were performed to evaluate the lower extremity deep venous systems from the level of the common femoral vein and including the common femoral, femoral, profunda femoral, popliteal and calf veins including the posterior tibial, peroneal and gastrocnemius veins when visible. The superficial great saphenous vein was also interrogated. Spectral Doppler was utilized to evaluate flow at rest and with distal augmentation maneuvers in the common femoral, femoral and popliteal veins. COMPARISON:  None. FINDINGS: Contralateral Common Femoral Vein: Respiratory phasicity is normal and symmetric with the symptomatic side. No evidence of thrombus. Normal compressibility. Common Femoral Vein: No evidence of thrombus. Normal compressibility, respiratory phasicity and response to augmentation. Saphenofemoral Junction: No evidence of thrombus. Normal compressibility and flow on color Doppler imaging. Profunda Femoral Vein: No evidence of thrombus. Normal compressibility and flow on color Doppler imaging. Femoral Vein: No evidence of thrombus. Normal compressibility, respiratory phasicity and response to augmentation. Popliteal Vein:  No evidence of thrombus. Normal compressibility, respiratory phasicity and response to augmentation. Calf Veins: No evidence of thrombus. Normal compressibility and flow on color Doppler imaging. Superficial Great Saphenous Vein: No evidence of thrombus. Normal compressibility. Venous Reflux:  None. Other Findings: There is a minimal amount of subcutaneous edema at the level of the left lower leg. IMPRESSION: No evidence of DVT within the left lower extremity. Electronically Signed   By: Sandi Mariscal M.D.   On: 01/15/2017 16:53     Assessment and plan- Patient is a 71 y.o. male for fatigue, weakness, joint stiffness and lack of appetite. He is afebrile. Vital signs are stable. Lung sounds clear. Normal heart rate and rhythm. Mild left lower extremity edema. He is in a wheelchair. He is wearing oxygen on 3 L continuous.   1. Anemia: Hemoglobin 7.3 today.  Spoke with Dr. Rogue Bussing and will give 1 unit packed red blood cells on Thursday. Patient agreed to transfusion but states if he feels better by Thursday he may cancel his appointment and wait to see how his labs are next week given we are stopping his Ibrutinib for now. Encouraged him to keep his appointment.  2. Thrombocytopenia: PLT 126. This is actually better then it has been. Continue to monitor. 3. End Stage Renal Disease: The patient was seen yesterday for dialysis and also received Procrit and Epogen. Cr 5.80. BUN 47. Followed closely by Dr. Myrtie Cruise. Receives Dialysis M, W, F.  4. Fatigue/Weakness/Lack of appetite: Given when the symptoms began this could be related to the initiation of  Ibrutinib. Consulted with Dr. Rogue Bussing and agreed to have him hold his ibrutinib until he returns next week for labs and assessment.  5. Oxygen/[facility]: Brahmanday's team is coordinating this for the patient. The patient has requested a humidifier for dry and bloody nose.  6. RTC on Thursday  for labs and 1 unit PRBC. RTC next week for labs and  assessment by Dr. Rogue Bussing.     Visit Diagnosis 1. Small B-cell lymphoma of intra-abdominal lymph nodes ([place])   2. Anemia due to chronic kidney disease, on chronic dialysis ([place])   3. Thrombocytopenia ([place])   4. Fatigue, unspecified type   5. Lack of appetite     Patient expressed understanding and was in agreement with this plan. He also understands that He can call clinic at any time with any questions, concerns, or complaints.    Marisue Humble [facility] Pager- [phone] 01/24/2017 1:33 PM

## 2017-01-23 NOTE — Progress Notes (Signed)
Patient here today as acute add on for symptom management today. Patient reports he is feeling fatigued, reports dizziness and decreased appetite. Patient states he has felt much worse since beginning new medication Ibrutinib.

## 2017-01-24 ENCOUNTER — Encounter: Payer: Self-pay | Admitting: *Deleted

## 2017-01-24 ENCOUNTER — Other Ambulatory Visit: Payer: Self-pay | Admitting: *Deleted

## 2017-01-24 ENCOUNTER — Telehealth: Payer: Self-pay | Admitting: *Deleted

## 2017-01-24 NOTE — Telephone Encounter (Signed)
[place] and ordered O2 refills and added humidity to patient's oxygen per patient's request. Patient's daughter [name] was also notified.  dhs

## 2017-01-24 NOTE — Telephone Encounter (Signed)
Spoke with patients daughter [name]) and she states her dad is eating and feels much better after his visit today. She also wanted an update on [place] and the status of his humidifier. I explained that it may take a few days but I have asked Brooke on Dr. Aletha Halim team to look into this for me.

## 2017-01-24 NOTE — Telephone Encounter (Signed)
See below

## 2017-01-25 ENCOUNTER — Inpatient Hospital Stay: Payer: Medicare HMO

## 2017-01-25 DIAGNOSIS — C8308 Small cell B-cell lymphoma, lymph nodes of multiple sites: Secondary | ICD-10-CM | POA: Diagnosis not present

## 2017-01-25 DIAGNOSIS — C8303 Small cell B-cell lymphoma, intra-abdominal lymph nodes: Secondary | ICD-10-CM

## 2017-01-25 LAB — BPAM RBC
Blood Product Expiration Date: 201811082359
Unit Type and Rh: 7300

## 2017-01-25 LAB — TYPE AND SCREEN
ABO/RH(D): B POS
Antibody Screen: NEGATIVE
Unit division: 0

## 2017-01-25 LAB — IRON AND TIBC
IRON: 29 ug/dL — AB (ref 45–182)
Saturation Ratios: 14 % — ABNORMAL LOW (ref 17.9–39.5)
TIBC: 213 ug/dL — AB (ref 250–450)
UIBC: 184 ug/dL

## 2017-01-25 LAB — ABO/RH: ABO/RH(D): B POS

## 2017-01-25 LAB — PREPARE RBC (CROSSMATCH)

## 2017-01-25 MED ORDER — ACETAMINOPHEN 325 MG PO TABS
650.0000 mg | ORAL_TABLET | Freq: Once | ORAL | Status: AC
  Administered 2017-01-25: 650 mg via ORAL
  Filled 2017-01-25: qty 2

## 2017-01-25 MED ORDER — SODIUM CHLORIDE 0.9 % IV SOLN
INTRAVENOUS | Status: DC
  Administered 2017-01-25: 11:00:00 via INTRAVENOUS
  Filled 2017-01-25: qty 1000

## 2017-01-25 MED ORDER — DIPHENHYDRAMINE HCL 50 MG/ML IJ SOLN
25.0000 mg | Freq: Once | INTRAMUSCULAR | Status: AC
  Administered 2017-01-25: 25 mg via INTRAVENOUS
  Filled 2017-01-25: qty 1

## 2017-01-25 NOTE — Progress Notes (Signed)
Had viable IV on the patient, IV occluded, four additional attempts were made and unable to get additional viable IV on patient.  Notified Dr. Rogue Bussing who instructed to discharge patient and he will follow up with patient's MD/Dialysis for possible blood transfusion.  Blood Bank notified.

## 2017-01-29 ENCOUNTER — Telehealth: Payer: Self-pay | Admitting: Internal Medicine

## 2017-01-29 NOTE — Telephone Encounter (Signed)
Robin, for tomorrow-10/30- please check if pt can come sooner in AM after 9 or at 11:30 because I have to go to [place] again in the afternoon as I am on call.

## 2017-01-30 ENCOUNTER — Inpatient Hospital Stay: Payer: Medicare HMO | Admitting: Internal Medicine

## 2017-01-30 ENCOUNTER — Other Ambulatory Visit: Payer: Medicare HMO

## 2017-02-01 ENCOUNTER — Other Ambulatory Visit: Payer: Medicare HMO

## 2017-02-01 ENCOUNTER — Ambulatory Visit: Payer: Medicare HMO | Admitting: Internal Medicine

## 2017-02-01 ENCOUNTER — Other Ambulatory Visit: Payer: Self-pay | Admitting: *Deleted

## 2017-02-01 ENCOUNTER — Inpatient Hospital Stay (HOSPITAL_BASED_OUTPATIENT_CLINIC_OR_DEPARTMENT_OTHER): Payer: Medicare HMO | Admitting: Internal Medicine

## 2017-02-01 ENCOUNTER — Inpatient Hospital Stay: Payer: Medicare HMO | Attending: Internal Medicine

## 2017-02-01 DIAGNOSIS — E1122 Type 2 diabetes mellitus with diabetic chronic kidney disease: Secondary | ICD-10-CM

## 2017-02-01 DIAGNOSIS — M21371 Foot drop, right foot: Secondary | ICD-10-CM | POA: Insufficient documentation

## 2017-02-01 DIAGNOSIS — Z9221 Personal history of antineoplastic chemotherapy: Secondary | ICD-10-CM

## 2017-02-01 DIAGNOSIS — D649 Anemia, unspecified: Secondary | ICD-10-CM

## 2017-02-01 DIAGNOSIS — Z87891 Personal history of nicotine dependence: Secondary | ICD-10-CM

## 2017-02-01 DIAGNOSIS — K219 Gastro-esophageal reflux disease without esophagitis: Secondary | ICD-10-CM | POA: Insufficient documentation

## 2017-02-01 DIAGNOSIS — C8303 Small cell B-cell lymphoma, intra-abdominal lymph nodes: Secondary | ICD-10-CM

## 2017-02-01 DIAGNOSIS — C9110 Chronic lymphocytic leukemia of B-cell type not having achieved remission: Secondary | ICD-10-CM | POA: Diagnosis not present

## 2017-02-01 DIAGNOSIS — Z79899 Other long term (current) drug therapy: Secondary | ICD-10-CM | POA: Diagnosis not present

## 2017-02-01 DIAGNOSIS — R5383 Other fatigue: Secondary | ICD-10-CM | POA: Diagnosis not present

## 2017-02-01 DIAGNOSIS — Z8673 Personal history of transient ischemic attack (TIA), and cerebral infarction without residual deficits: Secondary | ICD-10-CM

## 2017-02-01 DIAGNOSIS — I120 Hypertensive chronic kidney disease with stage 5 chronic kidney disease or end stage renal disease: Secondary | ICD-10-CM | POA: Diagnosis not present

## 2017-02-01 DIAGNOSIS — Z7982 Long term (current) use of aspirin: Secondary | ICD-10-CM | POA: Diagnosis not present

## 2017-02-01 DIAGNOSIS — R531 Weakness: Secondary | ICD-10-CM

## 2017-02-01 DIAGNOSIS — D696 Thrombocytopenia, unspecified: Secondary | ICD-10-CM | POA: Diagnosis not present

## 2017-02-01 DIAGNOSIS — K589 Irritable bowel syndrome without diarrhea: Secondary | ICD-10-CM

## 2017-02-01 DIAGNOSIS — Z9981 Dependence on supplemental oxygen: Secondary | ICD-10-CM | POA: Insufficient documentation

## 2017-02-01 DIAGNOSIS — R911 Solitary pulmonary nodule: Secondary | ICD-10-CM | POA: Insufficient documentation

## 2017-02-01 DIAGNOSIS — Z794 Long term (current) use of insulin: Secondary | ICD-10-CM

## 2017-02-01 DIAGNOSIS — C8308 Small cell B-cell lymphoma, lymph nodes of multiple sites: Secondary | ICD-10-CM

## 2017-02-01 DIAGNOSIS — Z992 Dependence on renal dialysis: Secondary | ICD-10-CM

## 2017-02-01 DIAGNOSIS — Z886 Allergy status to analgesic agent status: Secondary | ICD-10-CM

## 2017-02-01 DIAGNOSIS — N186 End stage renal disease: Secondary | ICD-10-CM

## 2017-02-01 DIAGNOSIS — R0609 Other forms of dyspnea: Secondary | ICD-10-CM | POA: Diagnosis not present

## 2017-02-01 DIAGNOSIS — H169 Unspecified keratitis: Secondary | ICD-10-CM

## 2017-02-01 DIAGNOSIS — E1140 Type 2 diabetes mellitus with diabetic neuropathy, unspecified: Secondary | ICD-10-CM

## 2017-02-01 DIAGNOSIS — Z809 Family history of malignant neoplasm, unspecified: Secondary | ICD-10-CM

## 2017-02-01 DIAGNOSIS — Z8551 Personal history of malignant neoplasm of bladder: Secondary | ICD-10-CM | POA: Insufficient documentation

## 2017-02-01 LAB — CBC WITH DIFFERENTIAL/PLATELET
BASOS PCT: 1 %
Basophils Absolute: 0 10*3/uL (ref 0–0.1)
EOS ABS: 0.2 10*3/uL (ref 0–0.7)
EOS PCT: 3 %
HCT: 20.2 % — ABNORMAL LOW (ref 40.0–52.0)
Hemoglobin: 6.7 g/dL — ABNORMAL LOW (ref 13.0–18.0)
LYMPHS ABS: 1.2 10*3/uL (ref 1.0–3.6)
Lymphocytes Relative: 18 %
MCH: 33.2 pg (ref 26.0–34.0)
MCHC: 33.2 g/dL (ref 32.0–36.0)
MCV: 100.1 fL — ABNORMAL HIGH (ref 80.0–100.0)
MONOS PCT: 2 %
Monocytes Absolute: 0.1 10*3/uL — ABNORMAL LOW (ref 0.2–1.0)
NEUTROS PCT: 76 %
Neutro Abs: 5.1 10*3/uL (ref 1.4–6.5)
PLATELETS: 131 10*3/uL — AB (ref 150–440)
RBC: 2.01 MIL/uL — ABNORMAL LOW (ref 4.40–5.90)
RDW: 16.3 % — ABNORMAL HIGH (ref 11.5–14.5)
WBC: 6.6 10*3/uL (ref 3.8–10.6)

## 2017-02-01 LAB — PROTIME-INR
INR: 1.1
Prothrombin Time: 14.1 seconds (ref 11.4–15.2)

## 2017-02-01 LAB — SAMPLE TO BLOOD BANK

## 2017-02-01 LAB — APTT: aPTT: 38 seconds — ABNORMAL HIGH (ref 24–36)

## 2017-02-01 NOTE — Assessment & Plan Note (Addendum)
#   SLL/CLL- stage IV  SEP 2018- PET scan progression in neck bilaterally;  pelvis [increase in right pelvic LN]; stable mediastinal LN. Patient noted to have worsening lymphadenopathy on imaging/clinically; worsening anemia/thrombocytopenia.  # Currently on ibrutinib 280 mg a day [since September 27]- LN appear to have responded well with significant reduction in size. Currently on hold d/t easy bruising and symptomatic anemia.  # Bruising- multifactorial etiology likely including ESRD. Bruising has  With decreasing aspirin and holding ibrutinib. As patient has not had any recent cardioembolic events, recommend patient continue aspirin at 81 mg dosing.   # Left eye keratitis- on topical anti-viral. Patient to follow back up with ophthalmology today. Improving  # Anemia hemoglobin 6.7 multifactorial; end-stage renal disease/CLL- unable to transfuse patient due to difficult IV access. Previously, LDH 159. Dr. Rogue Bussing discussed case with Dr. Juleen China. Proceed with Epogen and iron infusions as needed. Will do labwork with dialysis. Dr. Rogue Bussing will send lab orders but anticipate weekly CBC, Cmet, LDH, Haptoglobin, B12.   # follow up in 4 weeks  Cc; Dr.Kolluru.

## 2017-02-01 NOTE — Progress Notes (Signed)
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 Bone marrow biopsy on 03/05/13 with mildly hypercellular marrow for age 71-50% with interstitial predominantly small B-lymphocytic infiltrate estimated about 20-30% of marrow cells, mild nonspecific dyserythropoiesis, storage iron present this. Flow cytometry showed 29% CD5+ clonal B-cell population which is CD45+, CD5+, CD19+, CD20+, CD22+, CD23+, CD38+, HLA-Mark+, Slg kappa+, and is CD10-, CD11b-, FMC7-. 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 PET scan on 03/03/13 with extensive hypermetabolic lymphadenopathy.  (presented with progressive Thrombocytopenia with persistent Anemia. CBC on 01/16/13 showed hemoglobin 10.0, MCV 98, platelets 80, WBC 4890 with 40% neutrophils, 54% lymphocytes, 1.4% monocytes. On Epogen and Venofer at dialysis treatments. Further workup showed small M-spike of 0.2).   Got Treanda/Rituxan x2 cycles in Dec 2014/Jan 2015. Then on single agent Rituxan q 8 weeks (got #3 dose on 09/16/13). --------------------------------------------------------------- ----------------------  # DEC 2014- SMALL CELL LYMPHOMA/CLL- STAGE IV [BMBx- 03/2013- 20-30% B cell infiltrate; CD 5+/CD 20+]; FISH- Trisomy 12;DEC 2014-Treanda-Rituxan x2 [held sec to prolonged hospital/pneumoani/HD]; Rituxa q8W [last June 2015; Held sec to Superficial Bladder ca]; CT- JUNE 2017- STABLE Mediastinal LN/supraclav LN/pelvic LN; CT DEC 2017- mild progression noted ~1.5-2cm mediastinal/Ax LN. Cont surveillance  # SEP 2018- PROGRESSION on PET scan-  # SEP 27th 2018- Ibrutinib 280 mg/day [after HD];   # 3m RLL lung nodule- F/u in 6 M [Hx of smoking quit 2015]  # ESRD on HD [Mark [name]/Mark [name]]; DM; Non-healing ulcers in feet/Hx R foot  drop- walker ambulation  #Noninvasive Bladder cancer/recent UTIs-currently resolved.     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   In interim, patient was seen by symptom management clinic for complaints of fatigue and weakness. Patient had been taking Ibrutinib, which was stopped on 01/23/2017 due to side effects. Previously, patient had reported easy bruising and bleeding from his fistula site. He has chronically suffered from low energy and poor appetite. Patient had hospital admission 01/15/2017 for shortness of breath, pulmonary edema, bronchitis, new home oxygen usage. Since starting home oxygen he had some mild epistaxis and humidity was added. Patient's hemoglobin previously was 7.3, unable to transfuse 1 unit due to lack of IV access. Dialysis was not able to transfuse him either. Previously he did have some iron deficiency.   Today, he reports that the mass on his left neck has improved significantly. He feels his breathing has improved but continues to have sob when exerting himself. He saw Mark. KJuleen Chinaearlier this week and reports that he is to receive EPO three times a week. He reports the bruising on his left leg has improved. Ultrasound of the leg was negative for DVT. Previously patient of aspirin was dropped from 325 mg to 81 mg due to concerns of abnormal bruising. He reports that his eye is about the same and he will see the eye doctor today.  He continues to deny any blood in his stools, nausea, vomiting. Denies any unusual weight loss or night sweats, or new lumps or bumps. Denies shortness of breath or chest pain. No fever or chills.  REVIEW OF SYSTEMS:  A complete 10 point review of system is done which  is negative except mentioned above/history of present illness.   PAST MEDICAL HISTORY :  Past  Medical History:  Diagnosis Date  . Anemia   . Arthritis   . Bladder cancer ([place])   . Chronic kidney disease   . Diabetes mellitus without complication ([place])   . Dialysis patient [facility])    M,W,F  . Dyspnea    DOE  . GERD (gastroesophageal reflux disease)   . HOH (hard of hearing)   . Hypertension   . IBS (irritable bowel syndrome)   . Lymphoma ([place]) 09/27/2014  . Neuropathy    RIGHT LEG  . Stroke [facility])    TIA    PAST SURGICAL HISTORY :   Past Surgical History:  Procedure Laterality Date  . BACK SURGERY  2007  . CATARACT EXTRACTION W/PHACO Left 03/23/2016   Procedure: CATARACT EXTRACTION PHACO AND INTRAOCULAR LENS PLACEMENT (IOC);  Surgeon: Mark Koyanagi, MD;  Location: [facility] ORS;  Service: Ophthalmology;  Laterality: Left;  [place] 52.6 AP% 14.7 CDE 7.72 Fluid pack lot # [id number]  . CATARACT EXTRACTION W/PHACO Right 05/30/2016   Procedure: CATARACT EXTRACTION PHACO AND INTRAOCULAR LENS PLACEMENT (Mark [name]);  Surgeon: Mark Koyanagi, MD;  Location: [facility] ORS;  Service: Ophthalmology;  Laterality: Right;  [place] 01:08 AP% 13.9 CDE 9.53  note: could not get IV in patient, so procedure done without anesthesia personell present, ok per Mark [name] fluid pack lo t# [id number]  . CIRCUMCISION    . PERIPHERAL VASCULAR CATHETERIZATION Left 08/19/2015   Procedure: A/V Shuntogram/Fistulagram;  Surgeon: Mark Huxley, MD;  Location: [place] CV LAB;  Service: Cardiovascular;  Laterality: Left;  . PERIPHERAL VASCULAR CATHETERIZATION N/A 08/19/2015   Procedure: A/V Shunt Intervention;  Surgeon: Mark Huxley, MD;  Location: [place] CV LAB;  Service: Cardiovascular;  Laterality: N/A;    FAMILY HISTORY :   Family History  Problem Relation Age of Onset  . Cancer Mother   . Kidney cancer Neg Hx   . Prostate cancer Neg Hx   . Kidney failure Neg Hx   . Bladder Cancer Neg Hx     SOCIAL HISTORY:   Social History  Substance Use Topics  . Smoking status: Former Smoker    Types:  Cigarettes    Quit date: 05/19/2013  . Smokeless tobacco: Never Used     Comment: quit  . Alcohol use No    ALLERGIES:  is allergic to ibuprofen; multivitamin [centrum]; daypro [oxaprozin]; and tape.  MEDICATIONS:  Current Outpatient Prescriptions  Medication Sig Dispense Refill  . acyclovir (ZOVIRAX) 400 MG tablet One pill a day [to prevent shingles]; Take it after dialysis on the days of dialyisis 30 tablet 3  . albuterol (PROVENTIL HFA;VENTOLIN HFA) 108 (90 Base) MCG/ACT inhaler Inhale 2 puffs into the lungs every 4 (four) hours as needed for wheezing or shortness of breath. 1 Inhaler 2  . amLODipine (NORVASC) 10 MG tablet Take 10 mg by mouth daily.     [name] aspirin 81 MG EC tablet Take 81 mg by mouth daily.     . calcium acetate (PHOSLO) 667 MG capsule Take 1,334-2,001 mg by mouth See admin instructions. 3 caps with meals and 2 caps with snacks    . ENSURE PLUS (ENSURE PLUS) LIQD Take by mouth 3 (three) times a week. M,W.F    . Epoetin Alfa (EPOGEN IJ) Inject 4,800 Units as directed every 21 ( twenty-one) days.    . ferrous sulfate 325 (65 FE) MG EC tablet Take 325 mg by mouth  daily with breakfast.    . fluticasone (FLONASE) 50 MCG/ACT nasal spray Place 1 spray into both nostrils daily as needed for allergies.     . furosemide (LASIX) 80 MG tablet Take 80-160 mg by mouth See admin instructions. 2 tablets in am- on sat, Sunday, Tuesday and Thursday 1 tablet in PM- on Sundays -Saturdays    . gabapentin (NEURONTIN) 100 MG capsule Take 1 capsule (100 mg total) by mouth 3 (three) times daily. (Patient taking differently: Take 200-300 mg by mouth 2 (two) times daily. ) 90 capsule 0  . glimepiride (AMARYL) 4 MG tablet Take 8 mg by mouth at bedtime.     . hydrALAZINE (APRESOLINE) 25 MG tablet Take 1 tablet (25 mg total) by mouth 3 (three) times daily. 90 tablet 0  . LEVEMIR FLEXTOUCH 100 UNIT/ML Pen Inject 25-30 Units into the skin daily at 10 pm. Depending on what he eats    . lovastatin  (MEVACOR) 20 MG tablet Take 1 tablet by mouth daily.    . methylphenidate (RITALIN) 5 MG tablet Take 1 tablet by mouth daily.    . metoCLOPramide (REGLAN) 5 MG tablet Take 5 mg by mouth 3 (three) times daily before meals. Patient takes 1 tab with each meal.    . metoprolol tartrate (LOPRESSOR) 100 MG tablet Take 25 mg by mouth See admin instructions. Pt takes Tues morning, Friday Night, and Saturday morning and evening    . multivitamin (RENA-VIT) TABS tablet Take 1 tablet by mouth at bedtime.     [name] neomycin-polymyxin b-dexamethasone (MAXITROL) 3.5-10000-0.1 OINT Place 1 application into the left eye 2 (two) times daily.     [name] nystatin cream (MYCOSTATIN) Apply 1 application topically 2 (two) times daily.    . silver sulfADIAZINE (SILVADENE) 1 % cream Apply 1 application topically daily. 30 g 2  . ZIRGAN 0.15 % GEL Place 1 application into the left eye 3 (three) times daily as needed.    [name] CALCITRIOL PO Take 1 tablet by mouth daily.    . Difluprednate (DUREZOL) 0.05 % EMUL Place 1 drop into the left eye 4 (four) times daily.    . Ibrutinib 280 MG TABS Take 1 capsule by mouth daily. (Patient not taking: Reported on 02/01/2017) 30 tablet 4   No current facility-administered medications for this visit.     PHYSICAL EXAMINATION: ECOG PERFORMANCE STATUS: 3  BP (!) 156/70 (BP Location: Right Arm, Patient Position: Sitting)   Pulse 78   Temp 97.8 F (36.6 C) (Tympanic)   Resp (!) 22   Ht 5' 10" (1.778 m)   Wt 211 lb (95.7 kg)   SpO2 94% Comment: 2L oxygen  BMI 30.28 kg/m   Filed Weights   02/01/17 1153  Weight: 211 lb (95.7 kg)    GENERAL: Well-nourished well-developed; Alert, no distress and comfortable. Accompanied by Family EYES: no pallor or icterus; positive for injection of the left eye OROPHARYNX: no thrush or ulceration; poor dentition.  NECK: supple, no masses felt LYMPH:  Bilateral axillary lymph nodes no longer Palpable. Approximately 1 cm LN felt in left neck.  LUNGS: clear  to auscultation and  No wheeze or crackles HEART/CVS: regular rate & rhythm and no murmurs; No lower extremity edema;  R-LE in brace. Minimal redness in left lower extremity. ABDOMEN:abdomen soft, non-tender and normal bowel sounds Musculoskeletal:no cyanosis of digits and no clubbing  PSYCH: alert & oriented x 3 with fluent speech NEURO: no focal motor/sensory deficits SKIN:  Left arm fistula noted bruising  nearly resolved  LABORATORY DATA:  I have reviewed the data as listed    Component Value Date/Time   NA 134 (L) 01/23/2017 0920   NA 140 11/18/2013 1030   K 3.9 01/23/2017 0920   K 4.0 11/18/2013 1030   CL 95 (L) 01/23/2017 0920   CL 97 (L) 11/18/2013 1030   CO2 24 01/23/2017 0920   CO2 33 (H) 11/18/2013 1030   GLUCOSE 121 (H) 01/23/2017 0920   GLUCOSE 265 (H) 11/18/2013 1030   BUN 47 (H) 01/23/2017 0920   BUN 30 (H) 11/18/2013 1030   CREATININE 5.80 (H) 01/23/2017 0920   CREATININE 5.05 (H) 11/18/2013 1030   CALCIUM 7.9 (L) 01/23/2017 0920   CALCIUM 8.6 11/18/2013 1030   PROT 6.3 (L) 01/23/2017 0920   PROT 7.0 11/18/2013 1030   ALBUMIN 3.6 01/23/2017 0920   ALBUMIN 3.7 11/18/2013 1030   AST 18 01/23/2017 0920   AST 15 11/18/2013 1030   ALT 25 01/23/2017 0920   ALT 25 11/18/2013 1030   ALKPHOS 59 01/23/2017 0920   ALKPHOS 105 11/18/2013 1030   BILITOT 0.7 01/23/2017 0920   BILITOT 0.3 11/18/2013 1030   GFRNONAA 9 (L) 01/23/2017 0920   GFRNONAA 11 (L) 11/18/2013 1030   GFRAA 10 (L) 01/23/2017 0920   GFRAA 13 (L) 11/18/2013 1030    No results found for: SPEP, UPEP  Lab Results  Component Value Date   WBC 6.6 02/01/2017   NEUTROABS 5.1 02/01/2017   HGB 6.7 (L) 02/01/2017   HCT 20.2 (L) 02/01/2017   MCV 100.1 (H) 02/01/2017   PLT 131 (L) 02/01/2017      Chemistry      Component Value Date/Time   NA 134 (L) 01/23/2017 0920   NA 140 11/18/2013 1030   K 3.9 01/23/2017 0920   K 4.0 11/18/2013 1030   CL 95 (L) 01/23/2017 0920   CL 97 (L) 11/18/2013 1030    CO2 24 01/23/2017 0920   CO2 33 (H) 11/18/2013 1030   BUN 47 (H) 01/23/2017 0920   BUN 30 (H) 11/18/2013 1030   CREATININE 5.80 (H) 01/23/2017 0920   CREATININE 5.05 (H) 11/18/2013 1030      Component Value Date/Time   CALCIUM 7.9 (L) 01/23/2017 0920   CALCIUM 8.6 11/18/2013 1030   ALKPHOS 59 01/23/2017 0920   ALKPHOS 105 11/18/2013 1030   AST 18 01/23/2017 0920   AST 15 11/18/2013 1030   ALT 25 01/23/2017 0920   ALT 25 11/18/2013 1030   BILITOT 0.7 01/23/2017 0920   BILITOT 0.3 11/18/2013 1030     IMPRESSION: 1. Further enlargement of pathologic adenopathy in the neck, chest, abdomen, and pelvis, but with similar [place] 4 level of metabolic activity compared to the prior exam, compatible with active lymphoma. No bony involvement or splenomegaly identified. 2. 6 by 4 mm left upper lobe pulmonary nodule appears to be new and does not have hypermetabolic activity currently, surveillance suggested. 3. Several findings of indeterminate significance include some bandlike probable atelectasis or scarring medially in the right lower lobe with faintly accentuated activity, as well as some focal subcutaneous activity in stranding in the right lower quadrant. 4. Other imaging findings of potential clinical significance: Aortic Atherosclerosis (ICD10-I70.0). Right renal cysts. Coronary atherosclerosis.   Electronically Signed   By: Van Clines M.D.   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 Patient noted to have worsening lymphadenopathy on imaging/clinically; worsening anemia/thrombocytopenia.  # Currently on ibrutinib 280 mg a day [since September 27]- LN appear to have responded well with significant reduction in size. Currently on hold d/t easy bruising and symptomatic  anemia.  # Bruising- multifactorial etiology likely including ESRD. Bruising has  With decreasing aspirin and holding ibrutinib. As patient has not had any recent cardioembolic events, recommend patient continue aspirin at 81 mg dosing.   # Left eye keratitis- on topical anti-viral. Patient to follow back up with ophthalmology today. Improving  # Anemia hemoglobin 6.7 multifactorial; end-stage renal disease/CLL- unable to transfuse patient due to difficult IV access. Previously, LDH 159. Mark. Rogue Bussing discussed case with Mark. Juleen China. Proceed with Epogen and iron infusions as needed. Will do labwork with dialysis. Mark. Rogue Bussing will send lab orders but anticipate weekly CBC, Cmet, LDH, Haptoglobin, B12.   # follow up in 4 weeks  Cc; Mark [name].      No orders of the defined types were placed in this encounter.      Verlon Au, NP 02/01/2017 4:33 PM

## 2017-02-06 ENCOUNTER — Other Ambulatory Visit: Payer: Medicare HMO

## 2017-02-08 ENCOUNTER — Ambulatory Visit: Payer: Medicare HMO | Admitting: Urology

## 2017-02-08 ENCOUNTER — Encounter: Payer: Self-pay | Admitting: Urology

## 2017-02-08 VITALS — BP 154/71 | HR 82 | Ht 70.0 in | Wt 206.0 lb

## 2017-02-08 DIAGNOSIS — Z8551 Personal history of malignant neoplasm of bladder: Secondary | ICD-10-CM | POA: Diagnosis not present

## 2017-02-08 MED ORDER — CIPROFLOXACIN HCL 500 MG PO TABS
500.0000 mg | ORAL_TABLET | Freq: Once | ORAL | Status: AC
  Administered 2017-02-08: 500 mg via ORAL

## 2017-02-08 MED ORDER — LIDOCAINE HCL 2 % EX GEL
1.0000 "application " | Freq: Once | CUTANEOUS | Status: AC
  Administered 2017-02-08: 1 via URETHRAL

## 2017-02-08 NOTE — Progress Notes (Signed)
   02/08/17  CC:  Chief Complaint  Patient presents with  . Cysto    HPI: 1 - Bladder Cancer-  2015 - high-grade superficial bladder cancer s/p TURBT and induction BCG, compliant with cysto surveillance:  Recent sumarized course: 01/2015 - cysto NED 07/2015 - cysto NED; 01/2016 cysto NED 08/2016 - cysto NED  2 - End Stage Renal Disease- on IHD via left arm AVF MWF for ESRD. Still makes some urine voids about once per day.  3 - Right Non-Complex Renal Cyst- incidental Rt cyst by CT 2017, no complex features or mass effect.  Today "Mark [name]" is seen for cysto surveillance of bladder cancer No interval gross hematuria or dialysis issues.       Blood pressure (!) 154/71, pulse 82, height 5\' 10"  (1.778 m), weight 206 lb (93.4 kg). NED. A&Ox3.   No respiratory distress   Abd soft, NT, ND Normal phallus with bilateral descended testicles  Cystoscopy Procedure Note  Patient identification was confirmed, informed consent was obtained, and patient was prepped using Betadine solution.  Lidocaine jelly was administered per urethral meatus.    Preoperative abx where received prior to procedure.     Pre-Procedure: - Inspection reveals a normal caliber ureteral meatus.  Procedure: The flexible cystoscope was introduced without difficulty - No urethral strictures/lesions are present. - Enlarged prostate  - Normal bladder neck - Bilateral ureteral orifices identified - Bladder mucosa  reveals no ulcers, tumors, or lesions - No bladder stones - No trabeculation  Retroflexion shows no intravesical lobe   Post-Procedure: - Patient tolerated the procedure well  Assessment/ Plan:  1. History of bladder cancer- no recurrence by cysto today. Can increase interval to qyearly at this time  2. End-stage renal disease on hemodialysis [facility])- severe medical renal disease, doing well on IHD.   3. Renal cyst, right, on-complex- no indicaiton for further evaluation or  surveillance, observe.

## 2017-02-12 ENCOUNTER — Telehealth: Payer: Self-pay | Admitting: *Deleted

## 2017-02-12 ENCOUNTER — Telehealth: Payer: Self-pay | Admitting: Pharmacist

## 2017-02-12 NOTE — Telephone Encounter (Signed)
md reviewed labs

## 2017-02-12 NOTE — Telephone Encounter (Signed)
Oral Chemotherapy Pharmacist Encounter   Attempted to reach patient for follow up on oral medication: Ibrutinib, which is currently on hold. No answer. Left VM for patient to call back.   Darl Pikes, PharmD, BCPS Hematology/Oncology Clinical Pharmacist [facility] [phone]  02/12/2017 10:45 AM

## 2017-02-12 NOTE — Telephone Encounter (Addendum)
Nelda Severe from [facility] called and said that Dr Aleene Davidson faxed over patients lab results.

## 2017-02-15 ENCOUNTER — Telehealth: Payer: Self-pay | Admitting: *Deleted

## 2017-02-15 NOTE — Telephone Encounter (Signed)
Phone call returned. Spoke with daughter 1600. Explained that I previously spoke to patient per md order. md reviewed labs-hgb 6.7 at last lab draw. Explained to daughter and pt that md would like pt to have possible 2 units of blood and dialysis inpt tomorrow. Dr. Rogue Bussing spoke with oncall hospitalist today who agreed that it was appropriate to admit pt to hp services tomorrow. Pt normally has dialysis on Fridays. I personally spoke to patient. He states that he "felt greater today than any other previous day. I do not believe I needed to be transfused." This is why he requested me to personally reach out to his daughter to further discuss. He also has transportation concerns and daughter may or may not be able to bring him to his apts for HP admission any sooner than 11am.  Per daughter, she personally contacted the dialysis unit. She states that the hgb was not 6.7 and that the hgb was 7.6. She states that the 6.7 was the previous lab draw results. Given pt is asymptomatic at this time, daughter and pt would like to hold off on transfusion and proceed with dialysis only. I also personally called the patient's dialysis nurse Dee at [phone]. She states that the patient's hgb was 7.6. Pt also receiving epogen 2000 units sq at dialysis tomorrow. I inquired if pt's hgb would be rechecked next week. She states that the current order is hgb every other week, but she can ask the dialysis provider to run the hgb weekly if Dr. Rogue Bussing is requesting this. I gave her our dept fax number to fax results to our office. Dr. B made aware of the above conversations via phone call. He acknowledge pt's request of not being transfused. Will proceed with dialysis only/epo at this time a dialysis unit.  Pt and pt's daughter instructed that if pt becomes symptomatic or there is a significant drop in hgb, then he may need to proceed with a blood transfusion in the near future.

## 2017-02-15 NOTE — Telephone Encounter (Signed)
Oral Chemotherapy Pharmacist Encounter  Follow-Up Form  Spoke with patient's daughter [name] yesterday to follow up regarding patient's oral chemotherapy medication: Ibrutinib, which is currently on hold.  [name] stated that her father is still oxygen but his fatigue has improved while on this treatment break.  Original Start date of oral chemotherapy: 12/2016  Other Issues: N/A  [name] knows to call the office with questions or concerns. Oral Oncology Clinic will continue to follow.  Darl Pikes, PharmD, BCPS Hematology/Oncology Clinical Pharmacist [facility] [phone]  02/15/2017 10:04 AM

## 2017-02-15 NOTE — Telephone Encounter (Signed)
Daughter called asking for Nira Conn regarding wanting her father to go to [facility] tomorrow for blood transfusion. She thinks we got old results because she called Dialysis and was told his Hgb is 7.6. Asking that Nira Conn return her call

## 2017-03-01 ENCOUNTER — Inpatient Hospital Stay: Payer: Medicare HMO | Admitting: Internal Medicine

## 2017-03-01 VITALS — BP 184/85 | HR 84 | Temp 97.6°F | Resp 20 | Ht 70.0 in | Wt 207.8 lb

## 2017-03-01 DIAGNOSIS — H169 Unspecified keratitis: Secondary | ICD-10-CM | POA: Diagnosis not present

## 2017-03-01 DIAGNOSIS — Z79899 Other long term (current) drug therapy: Secondary | ICD-10-CM

## 2017-03-01 DIAGNOSIS — C8308 Small cell B-cell lymphoma, lymph nodes of multiple sites: Secondary | ICD-10-CM | POA: Diagnosis not present

## 2017-03-01 DIAGNOSIS — D696 Thrombocytopenia, unspecified: Secondary | ICD-10-CM

## 2017-03-01 DIAGNOSIS — R911 Solitary pulmonary nodule: Secondary | ICD-10-CM

## 2017-03-01 DIAGNOSIS — Z9221 Personal history of antineoplastic chemotherapy: Secondary | ICD-10-CM

## 2017-03-01 DIAGNOSIS — C8303 Small cell B-cell lymphoma, intra-abdominal lymph nodes: Secondary | ICD-10-CM

## 2017-03-01 DIAGNOSIS — R5383 Other fatigue: Secondary | ICD-10-CM | POA: Diagnosis not present

## 2017-03-01 DIAGNOSIS — D649 Anemia, unspecified: Secondary | ICD-10-CM

## 2017-03-01 DIAGNOSIS — R531 Weakness: Secondary | ICD-10-CM | POA: Diagnosis not present

## 2017-03-01 NOTE — Assessment & Plan Note (Addendum)
#   SLL/CLL- stage IV  SEP 2018- PET scan progression in neck bilaterally;  pelvis [increase in right pelvic LN]; stable mediastinal LN. Patient noted to have worsening lymphadenopathy on imaging/clinically; worsening anemia/thrombocytopenia.  #Recommend restarting ibrutinib 280 mg [post dialysis-Monday Wednesday Friday; and on Sunday].  However if the anemia recurs-recommend switching to [facility].  Discussed the potential infusion reactions.  #If patient's anemia worsens-then I would attribute anemia to ibrutinib.  Recommend monitoring CBC weekly basis with nephrology.  #Anemia-acute on chronic; nadir 6.6 [?  Secondary to ibrutinib-no evidence of hemolysis]; more recently 8.4-improving.  On Procrit and iron as per nephrology.  # Left eye keratitis-continue acyclovir.  # follow up in 3 weeks/labs thru nephro. Start ibrutinib 4 days/week [post dialysis; Sunday]; monitor cbc weekly.   Cc; Dr.Kolluru.

## 2017-03-01 NOTE — Patient Instructions (Signed)
#  Take ibrutinib 1 pill after your dialysis; and also on Sundays   # call us to inform if hemoglobin is dropping again.   GB

## 2017-03-01 NOTE — Progress Notes (Signed)
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 Bone marrow biopsy on 03/05/13 with mildly hypercellular marrow for age 71-50% with interstitial predominantly small B-lymphocytic infiltrate estimated about 20-30% of marrow cells, mild nonspecific dyserythropoiesis, storage iron present this. Flow cytometry showed 29% CD5+ clonal B-cell population which is CD45+, CD5+, CD19+, CD20+, CD22+, CD23+, CD38+, HLA-DR+, Slg kappa+, and is CD10-, CD11b-, FMC7-. 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 PET scan on 03/03/13 with extensive hypermetabolic lymphadenopathy.  (presented with progressive Thrombocytopenia with persistent Anemia. CBC on 01/16/13 showed hemoglobin 10.0, MCV 98, platelets 80, WBC 4890 with 40% neutrophils, 54% lymphocytes, 1.4% monocytes. On Epogen and Venofer at dialysis treatments. Further workup showed small M-spike of 0.2).   Got Treanda/Rituxan x2 cycles in Dec 2014/Jan 2015. Then on single agent Rituxan q 8 weeks (got #3 dose on 09/16/13). --------------------------------------------------------------- ----------------------  # DEC 2014- SMALL CELL LYMPHOMA/CLL- STAGE IV [BMBx- 03/2013- 20-30% B cell infiltrate; CD 5+/CD 20+]; FISH- Trisomy 12;DEC 2014-Treanda-Rituxan x2 [held sec to prolonged hospital/pneumoani/HD]; Rituxa q8W [last June 2015; Held sec to Superficial Bladder ca]; CT- JUNE 2017- STABLE Mediastinal LN/supraclav LN/pelvic LN; CT DEC 2017- mild progression noted ~1.5-2cm mediastinal/Ax LN. Cont surveillance  # SEP 2018- PROGRESSION on PET scan-  # SEP 27th 2018- Ibrutinib 280 mg/day [after HD];   # 12m RLL lung nodule- F/u in 6 M [Hx of smoking quit 2015]  # ESRD on HD [Dr.Voora/Dr.Kolluru]; DM; Non-healing ulcers in feet/Hx R foot  drop- walker ambulation  #Noninvasive Bladder cancer/recent UTIs-currently resolved.     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   Patient ibrutinib has been on hold for the last 2 weeks because of severe anemia hemoglobin 6.6.  Patient did not need any blood transfusion.  Labs no evidence of hemolysis. Most recent labs through nephrology 8.4 hemoglobin.   However he noticed to have increasing lumps under the left side of the neck.  Concerned about progression. He continues to deny any blood in his stools, nausea, vomiting. Denies any unusual weight loss or night sweats.No fever or chills.  [facility] "given bad experience"  REVIEW OF SYSTEMS:  A complete 10 point review of system is done which is negative except mentioned above/history of present illness.   PAST MEDICAL HISTORY :  Past Medical History:  Diagnosis Date  . Anemia   . Arthritis   . Bladder cancer ([place]   . Chronic kidney disease   . Diabetes mellitus without complication ([facility]   . [facility] ([facility]    M,W,F  . Dyspnea    DOE  . GERD (gastroesophageal reflux disease)   . HOH (hard of hearing)   . Hypertension   . IBS (irritable bowel syndrome)   . [place]oma ([facility] 09/[place]/2014  . Neuropathy    RIGHT LEG  . Stroke ([facility]    TIA    PAST SURGICAL HISTORY :   Past Surgical History:  Procedure Laterality Date  . BACK SURGERY  2007  . CATARACT EXTRACTION W/PHACO Left 03/23/2016   Procedure: CATARACT EXTRACTION PHACO AND INTRAOCULAR LENS PLACEMENT (IOC);  Surgeon: CNila Nephew  Brasington, MD;  Location: [facility] ORS;  Service: Ophthalmology;  Laterality: Left;  [place] 52.6 AP% 14.7 CDE 7.72 Fluid pack lot # [id number]  . CATARACT EXTRACTION W/PHACO Right 05/30/2016   Procedure: CATARACT  EXTRACTION PHACO AND INTRAOCULAR LENS PLACEMENT ([place]);  Surgeon: Leandrew Koyanagi, MD;  Location: [facility] ORS;  Service: Ophthalmology;  Laterality: Right;  [place] 01:08 AP% 13.9 CDE 9.53  note: could not get IV in patient, so procedure done without anesthesia personell present, ok per Dr Wallace Going fluid pack lo t# [id number]  . CIRCUMCISION    . PERIPHERAL VASCULAR CATHETERIZATION Left 08/19/2015   Procedure: A/V Shuntogram/Fistulagram;  Surgeon: Algernon Huxley, MD;  Location: [place] [facility];  Service: Cardiovascular;  Laterality: Left;  . PERIPHERAL VASCULAR CATHETERIZATION N/A 08/19/2015   Procedure: A/V Shunt Intervention;  Surgeon: Algernon Huxley, MD;  Location: [place] [facility];  Service: Cardiovascular;  Laterality: N/A;    FAMILY HISTORY :   Family History  Problem Relation Age of Onset  . Cancer Mother   . Kidney cancer Neg Hx   . Prostate cancer Neg Hx   . Kidney failure Neg Hx   . Bladder Cancer Neg Hx     SOCIAL HISTORY:   Social History   Tobacco Use  . Smoking status: Former Smoker    Types: Cigarettes    Last attempt to quit: 05/19/2013    Years since quitting: 3.7  . Smokeless tobacco: Never Used  . Tobacco comment: quit  Substance Use Topics  . Alcohol use: No    Alcohol/week: 0.0 oz  . Drug use: No    ALLERGIES:  is allergic to ibuprofen; multivitamin [centrum]; daypro [oxaprozin]; and tape.  MEDICATIONS:  Current Outpatient Medications  Medication Sig Dispense Refill  . amLODipine (NORVASC) 10 MG tablet Take 10 mg by mouth daily.     [name] aspirin 81 MG EC tablet Take 81 mg by mouth daily.     . calcium acetate (PHOSLO) 667 MG capsule Take 1,334-2,001 mg by mouth See admin instructions. 3 caps with meals and 2 caps with snacks    . Difluprednate (DUREZOL) 0.05 % EMUL Place 1 drop into the left eye 4 (four) times daily.    [name] ENSURE PLUS (ENSURE PLUS) LIQD Take by mouth 3 (three) times a week. M,W.F    . Epoetin Alfa (EPOGEN IJ) Inject 4,800 Units as directed  every 21 ( twenty-one) days.    . fluticasone (FLONASE) 50 MCG/ACT nasal spray Place 1 spray into both nostrils daily as needed for allergies.     . furosemide (LASIX) 80 MG tablet Take 80-160 mg by mouth See admin instructions. 2 tablets in am- on sat, Sunday, Tuesday and Thursday 1 tablet in PM- on Sundays -Saturdays    . gabapentin (NEURONTIN) 100 MG capsule Take 1 capsule (100 mg total) by mouth 3 (three) times daily. (Patient taking differently: Take 200-300 mg by mouth 2 (two) times daily. ) 90 capsule 0  . glimepiride (AMARYL) 4 MG tablet Take 8 mg by mouth at bedtime.     [name] LEVEMIR FLEXTOUCH 100 UNIT/ML Pen Inject 25-30 Units into the skin daily at 10 pm. Depending on what he eats    . lovastatin (MEVACOR) 20 MG tablet Take 1 tablet by mouth daily.    . metoCLOPramide (REGLAN) 5 MG tablet Take 5 mg by mouth 3 (three) times daily before meals. Patient takes 1 tab with each meal.    . metoprolol tartrate (LOPRESSOR) 100 MG tablet Take 25  mg by mouth See admin instructions. Pt takes Tues morning, Friday Night, and Saturday morning and evening    . multivitamin (RENA-VIT) TABS tablet Take 1 tablet by mouth at bedtime.     [name] albuterol (PROVENTIL HFA;VENTOLIN HFA) 108 (90 Base) MCG/ACT inhaler Inhale 2 puffs into the lungs every 4 (four) hours as needed for wheezing or shortness of breath. (Patient not taking: Reported on 03/01/2017) 1 Inhaler 2  . Ibrutinib 280 MG TABS Take 1 capsule by mouth daily. (Patient not taking: Reported on 02/01/2017) 30 tablet 4  . nystatin cream (MYCOSTATIN) Apply 1 application topically 2 (two) times daily.    . silver sulfADIAZINE (SILVADENE) 1 % cream Apply 1 application topically daily. (Patient not taking: Reported on 03/01/2017) 30 g 2   No current facility-administered medications for this visit.     PHYSICAL EXAMINATION: ECOG PERFORMANCE STATUS: 3  BP (!) 184/85 (BP Location: Right Arm, Patient Position: Sitting)   Pulse 84   Temp 97.6 F (36.4 C)    Resp 20   Ht '5\' 10"'  (1.778 m)   Wt 207 lb 12.8 oz (94.3 kg)   SpO2 96%   BMI 29.82 kg/m   Filed Weights   03/01/17 1038  Weight: 207 lb 12.8 oz (94.3 kg)    GENERAL: Well-nourished well-developed; Alert, no distress and comfortable. Accompanied by Family EYES: no pallor or icterus; positive for injection of the left eye OROPHARYNX: no thrush or ulceration; poor dentition.  NECK: supple, no masses felt LYMPH:  Bilateral axillary lymph nodes no longer Palpable. Approximately 3-4 cm cm LN felt in left neck.  LUNGS: clear to auscultation and  No wheeze or crackles HEART/CVS: regular rate & rhythm and no murmurs; No lower extremity edema;  R-LE in brace. Minimal redness in left lower extremity. ABDOMEN:abdomen soft, non-tender and normal bowel sounds Musculoskeletal:no cyanosis of digits and no clubbing  PSYCH: alert & oriented x 3 with fluent speech NEURO: no focal motor/sensory deficits SKIN:  Left arm fistula noted bruising nearly resolved  LABORATORY DATA:  I have reviewed the data as listed    Component Value Date/Time   NA 134 (L) 01/23/2017 0920   NA 140 11/18/2013 1030   K 3.9 01/23/2017 0920   K 4.0 11/18/2013 1030   CL 95 (L) 01/23/2017 0920   CL 97 (L) 11/18/2013 1030   CO2 24 01/23/2017 0920   CO2 33 (H) 11/18/2013 1030   GLUCOSE 121 (H) 01/23/2017 0920   GLUCOSE 265 (H) 11/18/2013 1030   BUN 47 (H) 01/23/2017 0920   BUN 30 (H) 11/18/2013 1030   CREATININE 5.80 (H) 01/23/2017 0920   CREATININE 5.05 (H) 11/18/2013 1030   CALCIUM 7.9 (L) 01/23/2017 0920   CALCIUM 8.6 11/18/2013 1030   PROT 6.3 (L) 01/23/2017 0920   PROT 7.0 11/18/2013 1030   ALBUMIN 3.6 01/23/2017 0920   ALBUMIN 3.7 11/18/2013 1030   AST 18 01/23/2017 0920   AST 15 11/18/2013 1030   ALT 25 01/23/2017 0920   ALT 25 11/18/2013 1030   ALKPHOS 59 01/23/2017 0920   ALKPHOS 105 11/18/2013 1030   BILITOT 0.7 01/23/2017 0920   BILITOT 0.3 11/18/2013 1030   GFRNONAA 9 (L) 01/23/2017 0920    GFRNONAA 11 (L) 11/18/2013 1030   GFRAA 10 (L) 01/23/2017 0920   GFRAA 13 (L) 11/18/2013 1030    No results found for: SPEP, UPEP  Lab Results  Component Value Date   WBC 6.6 02/01/2017   NEUTROABS 5.1 02/01/2017  HGB 6.7 (L) 02/01/2017   HCT 20.2 (L) 02/01/2017   MCV 100.1 (H) 02/01/2017   PLT 131 (L) 02/01/2017      Chemistry      Component Value Date/Time   NA 134 (L) 01/23/2017 0920   NA 140 11/18/2013 1030   K 3.9 01/23/2017 0920   K 4.0 11/18/2013 1030   CL 95 (L) 01/23/2017 0920   CL 97 (L) 11/18/2013 1030   CO2 24 01/23/2017 0920   CO2 33 (H) 11/18/2013 1030   BUN 47 (H) 01/23/2017 0920   BUN 30 (H) 11/18/2013 1030   CREATININE 5.80 (H) 01/23/2017 0920   CREATININE 5.05 (H) 11/18/2013 1030      Component Value Date/Time   CALCIUM 7.9 (L) 01/23/2017 0920   CALCIUM 8.6 11/18/2013 1030   ALKPHOS 59 01/23/2017 0920   ALKPHOS 105 11/18/2013 1030   AST 18 01/23/2017 0920   AST 15 11/18/2013 1030   ALT 25 01/23/2017 0920   ALT 25 11/18/2013 1030   BILITOT 0.7 01/23/2017 0920   BILITOT 0.3 11/18/2013 1030     IMPRESSION: 1. Further enlargement of pathologic adenopathy in the neck, chest, abdomen, and pelvis, but with similar [place] 4 level of metabolic activity compared to the prior exam, compatible with active lymphoma. No bony involvement or splenomegaly identified. 2. 6 by 4 mm left upper lobe pulmonary nodule appears to be new and does not have hypermetabolic activity currently, surveillance suggested. 3. Several findings of indeterminate significance include some bandlike probable atelectasis or scarring medially in the right lower lobe with faintly accentuated activity, as well as some focal subcutaneous activity in stranding in the right lower quadrant. 4. Other imaging findings of potential clinical significance: Aortic Atherosclerosis (ICD10-I70.0). Right renal cysts. Coronary atherosclerosis.   Electronically Signed   By: Van Clines M.D.   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 Patient noted to have worsening lymphadenopathy on imaging/clinically; worsening anemia/thrombocytopenia.  #Recommend restarting ibrutinib 280 mg [post dialysis-Monday Wednesday Friday; and on Sunday].  However if the anemia recurs-recommend switching to [facility].  Discussed the potential infusion reactions.  #If patient's anemia worsens-then I would attribute anemia to ibrutinib.  Recommend monitoring CBC weekly basis with nephrology.  #Anemia-acute on chronic; nadir 6.6 [?  Secondary to ibrutinib-no evidence of hemolysis]; more recently 8.4-improving.  On Procrit and iron as per nephrology.  # Left eye keratitis-continue acyclovir.  # follow up in 3 weeks/labs thru nephro. Start ibrutinib 4 days/week [post dialysis; Sunday]; monitor cbc weekly.   Cc; Dr.Kolluru.     No orders of the defined types were placed in this encounter.      Cammie Sickle, MD 03/01/2017 5:50 PM

## 2017-03-01 NOTE — Progress Notes (Signed)
F/u for lymphoma. Patient last hgb was 8.4 (drawn at dialysis) on 02/26/17.

## 2017-03-08 ENCOUNTER — Telehealth: Payer: Self-pay | Admitting: Pharmacist

## 2017-03-08 NOTE — Telephone Encounter (Signed)
Oral Chemotherapy Pharmacist Encounter  Received message that Mr. Newbrough's daughter [name] called. Returned [employer] call and she told me her father was having some issues with his ibrutinib.  He has taken 2 doses of his Ibrutinib (Friday 11/30 and Sunday 12/2) and his fatigue has returned. He has been very sleepy, tired, and SOB. She also mentioned he had blurred vision but he just restarted his acyclovir which helps treat his eye keratitis.  Spoke with Dr. Rogue Bussing, he would like for Mr. Saylor to stop his ibrutinib for now. He will discuss with patient next steps. Called [name] and let her know to hold the ibrutinib for now. She stated her understanding.   Darl Pikes, PharmD, BCPS Hematology/Oncology Clinical Pharmacist [facility] [phone]  03/08/2017 2:40 PM

## 2017-03-13 ENCOUNTER — Ambulatory Visit: Payer: Medicare HMO | Admitting: Podiatry

## 2017-03-20 ENCOUNTER — Ambulatory Visit: Payer: Medicare HMO | Admitting: Podiatry

## 2017-03-22 ENCOUNTER — Inpatient Hospital Stay: Payer: Medicare HMO | Attending: Internal Medicine | Admitting: Internal Medicine

## 2017-03-22 VITALS — BP 170/74 | HR 80 | Temp 97.1°F | Resp 16 | Wt 206.0 lb

## 2017-03-22 DIAGNOSIS — K219 Gastro-esophageal reflux disease without esophagitis: Secondary | ICD-10-CM | POA: Diagnosis not present

## 2017-03-22 DIAGNOSIS — R21 Rash and other nonspecific skin eruption: Secondary | ICD-10-CM | POA: Insufficient documentation

## 2017-03-22 DIAGNOSIS — C8303 Small cell B-cell lymphoma, intra-abdominal lymph nodes: Secondary | ICD-10-CM | POA: Diagnosis present

## 2017-03-22 DIAGNOSIS — R0902 Hypoxemia: Secondary | ICD-10-CM | POA: Diagnosis not present

## 2017-03-22 DIAGNOSIS — D649 Anemia, unspecified: Secondary | ICD-10-CM | POA: Insufficient documentation

## 2017-03-22 DIAGNOSIS — C679 Malignant neoplasm of bladder, unspecified: Secondary | ICD-10-CM | POA: Diagnosis not present

## 2017-03-22 DIAGNOSIS — Z79899 Other long term (current) drug therapy: Secondary | ICD-10-CM | POA: Diagnosis not present

## 2017-03-22 DIAGNOSIS — R590 Localized enlarged lymph nodes: Secondary | ICD-10-CM | POA: Diagnosis not present

## 2017-03-22 DIAGNOSIS — E1122 Type 2 diabetes mellitus with diabetic chronic kidney disease: Secondary | ICD-10-CM | POA: Insufficient documentation

## 2017-03-22 DIAGNOSIS — Z794 Long term (current) use of insulin: Secondary | ICD-10-CM | POA: Diagnosis not present

## 2017-03-22 DIAGNOSIS — Z7982 Long term (current) use of aspirin: Secondary | ICD-10-CM | POA: Insufficient documentation

## 2017-03-22 DIAGNOSIS — R911 Solitary pulmonary nodule: Secondary | ICD-10-CM | POA: Insufficient documentation

## 2017-03-22 DIAGNOSIS — I120 Hypertensive chronic kidney disease with stage 5 chronic kidney disease or end stage renal disease: Secondary | ICD-10-CM | POA: Insufficient documentation

## 2017-03-22 DIAGNOSIS — K589 Irritable bowel syndrome without diarrhea: Secondary | ICD-10-CM | POA: Insufficient documentation

## 2017-03-22 DIAGNOSIS — Z992 Dependence on renal dialysis: Secondary | ICD-10-CM | POA: Diagnosis not present

## 2017-03-22 DIAGNOSIS — I2510 Atherosclerotic heart disease of native coronary artery without angina pectoris: Secondary | ICD-10-CM | POA: Diagnosis not present

## 2017-03-22 DIAGNOSIS — N186 End stage renal disease: Secondary | ICD-10-CM | POA: Diagnosis not present

## 2017-03-22 DIAGNOSIS — Z8673 Personal history of transient ischemic attack (TIA), and cerebral infarction without residual deficits: Secondary | ICD-10-CM | POA: Insufficient documentation

## 2017-03-22 DIAGNOSIS — Z87891 Personal history of nicotine dependence: Secondary | ICD-10-CM | POA: Diagnosis not present

## 2017-03-22 DIAGNOSIS — M21371 Foot drop, right foot: Secondary | ICD-10-CM | POA: Diagnosis not present

## 2017-03-22 DIAGNOSIS — N281 Cyst of kidney, acquired: Secondary | ICD-10-CM | POA: Insufficient documentation

## 2017-03-22 NOTE — Progress Notes (Signed)
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 Bone marrow biopsy on 03/05/13 with mildly hypercellular marrow for age 71-50% with interstitial predominantly small B-lymphocytic infiltrate estimated about 20-30% of marrow cells, mild nonspecific dyserythropoiesis, storage iron present this. Flow cytometry showed 29% CD5+ clonal B-cell population which is CD45+, CD5+, CD19+, CD20+, CD22+, CD23+, CD38+, HLA-DR+, Slg kappa+, and is CD10-, CD11b-, FMC7-. 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 PET scan on 03/03/13 with extensive hypermetabolic lymphadenopathy.  (presented with progressive Thrombocytopenia with persistent Anemia. CBC on 01/16/13 showed hemoglobin 10.0, MCV 98, platelets 80, WBC 4890 with 40% neutrophils, 54% lymphocytes, 1.4% monocytes. On Epogen and Venofer at dialysis treatments. Further workup showed small M-spike of 0.2).   Got Treanda/Rituxan x2 cycles in Dec 2014/Jan 2015. Then on single agent Rituxan q 8 weeks (got #3 dose on 09/16/13). --------------------------------------------------------------- ----------------------  # DEC 2014- SMALL CELL LYMPHOMA/CLL- STAGE IV [BMBx- 03/2013- 20-30% B cell infiltrate; CD 5+/CD 20+]; FISH- Trisomy 12;DEC 2014-Treanda-Rituxan x2 [held sec to prolonged hospital/pneumoani/HD]; Rituxa q8W [last June 2015; Held sec to Superficial Bladder ca]; CT- JUNE 2017- STABLE Mediastinal LN/supraclav LN/pelvic LN; CT DEC 2017- mild progression noted ~1.5-2cm mediastinal/Ax LN. Cont surveillance  # SEP 2018- PROGRESSION on PET scan-  # SEP 27th 2018- Ibrutinib 280 mg/day [after HD];   # 7mm RLL lung nodule- F/u in 6 M [Hx of smoking quit 2015]  # ESRD on HD [Dr.Voora/Dr.Kolluru]; DM; Non-healing ulcers in feet/Hx R foot  drop- walker ambulation  #Noninvasive Bladder cancer/recent UTIs-currently resolved.     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   Patient's ibrutinib has been held for the last 4 weeks because of severe anemia hemoglobin 6.7.  However as per patient most recent hemoglobin has been around 9.7.   However, he noticed to have increasing lumps under the left side of the neck-however no pain noted. He continues to deny any blood in his stools, nausea, vomiting. Denies any unusual weight loss or night sweats. No fever or chills.  Complains of skin rash on his left calf.  REVIEW OF SYSTEMS:  A complete 10 point review of system is done which is negative except mentioned above/history of present illness.   PAST MEDICAL HISTORY :  Past Medical History:  Diagnosis Date  . Anemia   . Arthritis   . Bladder cancer (HCC)   . Chronic kidney disease   . Diabetes mellitus without complication (HCC)   . Dialysis patient (HCC)    M,W,F  . Dyspnea    DOE  . GERD (gastroesophageal reflux disease)   . HOH (hard of hearing)   . Hypertension   . IBS (irritable bowel syndrome)   . Lymphoma (HCC) 09/27/2014  . Neuropathy    RIGHT LEG  . Stroke (HCC)    TIA    PAST SURGICAL HISTORY :   Past Surgical History:  Procedure Laterality Date  . BACK SURGERY  2007  . CATARACT EXTRACTION W/PHACO Left 03/23/2016   Procedure: CATARACT EXTRACTION PHACO AND INTRAOCULAR LENS PLACEMENT (IOC);  Surgeon: Chadwick Brasington, MD;  Location: [facility] ORS;  Service: Ophthalmology;  Laterality: Left;    [place] 52.6 AP% 14.7 CDE 7.72 Fluid pack lot # [id number]  . CATARACT EXTRACTION W/PHACO Right 05/30/2016   Procedure: CATARACT EXTRACTION PHACO AND INTRAOCULAR LENS PLACEMENT ([place]);  Surgeon: Leandrew Koyanagi, MD;   Location: [facility] ORS;  Service: Ophthalmology;  Laterality: Right;  [place] 01:08 AP% 13.9 CDE 9.53  note: could not get IV in patient, so procedure done without anesthesia personell present, ok per Dr Wallace Going fluid pack lo t# [id number]  . CIRCUMCISION    . PERIPHERAL VASCULAR CATHETERIZATION Left 08/19/2015   Procedure: A/V Shuntogram/Fistulagram;  Surgeon: Algernon Huxley, MD;  Location: [place] [facility];  Service: Cardiovascular;  Laterality: Left;  . PERIPHERAL VASCULAR CATHETERIZATION N/A 08/19/2015   Procedure: A/V Shunt Intervention;  Surgeon: Algernon Huxley, MD;  Location: [place] [facility];  Service: Cardiovascular;  Laterality: N/A;    FAMILY HISTORY :   Family History  Problem Relation Age of Onset  . Cancer Mother   . Kidney cancer Neg Hx   . Prostate cancer Neg Hx   . Kidney failure Neg Hx   . Bladder Cancer Neg Hx     SOCIAL HISTORY:   Social History   Tobacco Use  . Smoking status: Former Smoker    Types: Cigarettes    Last attempt to quit: 05/19/2013    Years since quitting: 3.8  . Smokeless tobacco: Never Used  . Tobacco comment: quit  Substance Use Topics  . Alcohol use: No    Alcohol/week: 0.0 oz  . Drug use: No    ALLERGIES:  is allergic to ibuprofen; multivitamin [centrum]; daypro [oxaprozin]; and tape.  MEDICATIONS:  Current Outpatient Medications  Medication Sig Dispense Refill  . albuterol (PROVENTIL HFA;VENTOLIN HFA) 108 (90 Base) MCG/ACT inhaler Inhale 2 puffs into the lungs every 4 (four) hours as needed for wheezing or shortness of breath. 1 Inhaler 2  . amLODipine (NORVASC) 10 MG tablet Take 10 mg by mouth daily.     [name] aspirin 81 MG EC tablet Take 81 mg by mouth daily.     . calcium acetate (PHOSLO) 667 MG capsule Take 1,334-2,001 mg by mouth See admin instructions. 3 caps with meals and 2 caps with snacks    . Difluprednate (DUREZOL) 0.05 % EMUL Place 1 drop into the left eye 4 (four) times daily.    [name] ENSURE PLUS (ENSURE PLUS) LIQD Take by mouth  3 (three) times a week. M,W.F    . Epoetin Alfa (EPOGEN IJ) Inject 4,800 Units as directed every 21 ( twenty-one) days.    . fluticasone (FLONASE) 50 MCG/ACT nasal spray Place 1 spray into both nostrils daily as needed for allergies.     . furosemide (LASIX) 80 MG tablet Take 80-160 mg by mouth See admin instructions. 2 tablets in am- on sat, Sunday, Tuesday and Thursday 1 tablet in PM- on Sundays -Saturdays    . gabapentin (NEURONTIN) 100 MG capsule Take 1 capsule (100 mg total) by mouth 3 (three) times daily. (Patient taking differently: Take 200-300 mg by mouth 2 (two) times daily. ) 90 capsule 0  . glimepiride (AMARYL) 4 MG tablet Take 8 mg by mouth at bedtime.     . Ibrutinib 280 MG TABS Take 1 capsule by mouth daily. 30 tablet 4  . LEVEMIR FLEXTOUCH 100 UNIT/ML Pen Inject 25-30 Units into the skin daily at 10 pm. Depending on what he eats    . lovastatin (MEVACOR) 20 MG tablet Take 1 tablet by mouth daily.    . metoCLOPramide (  REGLAN) 5 MG tablet Take 5 mg by mouth 3 (three) times daily before meals. Patient takes 1 tab with each meal.    . metoprolol tartrate (LOPRESSOR) 100 MG tablet Take 25 mg by mouth See admin instructions. Pt takes Tues morning, Friday Night, and Saturday morning and evening    . multivitamin (RENA-VIT) TABS tablet Take 1 tablet by mouth at bedtime.     . nystatin cream (MYCOSTATIN) Apply 1 application topically 2 (two) times daily.    . silver sulfADIAZINE (SILVADENE) 1 % cream Apply 1 application topically daily. 30 g 2   No current facility-administered medications for this visit.     PHYSICAL EXAMINATION: ECOG PERFORMANCE STATUS: 3  BP (!) 170/74 (BP Location: Left Arm, Patient Position: Sitting)   Pulse 80   Temp (!) 97.1 F (36.2 C) (Tympanic)   Resp 16   Wt 206 lb (93.4 kg)   BMI 29.56 kg/m   Filed Weights   03/22/17 1023  Weight: 206 lb (93.4 kg)    GENERAL: Well-nourished well-developed; Alert, no distress and comfortable. Accompanied by  Family.  He is in a wheelchair. EYES: no pallor or icterus; positive for injection of the left eye OROPHARYNX: no thrush or ulceration; poor dentition.  NECK: supple, no masses felt LYMPH:  Bilateral axillary lymph nodes no longer Palpable. Approximately 3-4 cm cm LN felt in left neck.  LUNGS: clear to auscultation and  No wheeze or crackles HEART/CVS: regular rate & rhythm and no murmurs; No lower extremity edema;  R-LE in brace. Minimal redness in left lower extremity. ABDOMEN:abdomen soft, non-tender and normal bowel sounds Musculoskeletal:no cyanosis of digits and no clubbing  PSYCH: alert & oriented x 3 with fluent speech NEURO: no focal motor/sensory deficits SKIN: Mild skin rash noted-macular left calf.  LABORATORY DATA:  I have reviewed the data as listed    Component Value Date/Time   NA 134 (L) 01/23/2017 0920   NA 140 11/18/2013 1030   K 3.9 01/23/2017 0920   K 4.0 11/18/2013 1030   CL 95 (L) 01/23/2017 0920   CL 97 (L) 11/18/2013 1030   CO2 24 01/23/2017 0920   CO2 33 (H) 11/18/2013 1030   GLUCOSE 121 (H) 01/23/2017 0920   GLUCOSE 265 (H) 11/18/2013 1030   BUN 47 (H) 01/23/2017 0920   BUN 30 (H) 11/18/2013 1030   CREATININE 5.80 (H) 01/23/2017 0920   CREATININE 5.05 (H) 11/18/2013 1030   CALCIUM 7.9 (L) 01/23/2017 0920   CALCIUM 8.6 11/18/2013 1030   PROT 6.3 (L) 01/23/2017 0920   PROT 7.0 11/18/2013 1030   ALBUMIN 3.6 01/23/2017 0920   ALBUMIN 3.7 11/18/2013 1030   AST 18 01/23/2017 0920   AST 15 11/18/2013 1030   ALT 25 01/23/2017 0920   ALT 25 11/18/2013 1030   ALKPHOS 59 01/23/2017 0920   ALKPHOS 105 11/18/2013 1030   BILITOT 0.7 01/23/2017 0920   BILITOT 0.3 11/18/2013 1030   GFRNONAA 9 (L) 01/23/2017 0920   GFRNONAA 11 (L) 11/18/2013 1030   GFRAA 10 (L) 01/23/2017 0920   GFRAA 13 (L) 11/18/2013 1030    No results found for: SPEP, UPEP  Lab Results  Component Value Date   WBC 6.6 02/01/2017   NEUTROABS 5.1 02/01/2017   HGB 6.7 (L) 02/01/2017    HCT 20.2 (L) 02/01/2017   MCV 100.1 (H) 02/01/2017   PLT 131 (L) 02/01/2017      Chemistry      Component Value Date/Time   NA 134 (  L) 01/23/2017 0920   NA 140 11/18/2013 1030   K 3.9 01/23/2017 0920   K 4.0 11/18/2013 1030   CL 95 (L) 01/23/2017 0920   CL 97 (L) 11/18/2013 1030   CO2 24 01/23/2017 0920   CO2 33 (H) 11/18/2013 1030   BUN 47 (H) 01/23/2017 0920   BUN 30 (H) 11/18/2013 1030   CREATININE 5.80 (H) 01/23/2017 0920   CREATININE 5.05 (H) 11/18/2013 1030      Component Value Date/Time   CALCIUM 7.9 (L) 01/23/2017 0920   CALCIUM 8.6 11/18/2013 1030   ALKPHOS 59 01/23/2017 0920   ALKPHOS 105 11/18/2013 1030   AST 18 01/23/2017 0920   AST 15 11/18/2013 1030   ALT 25 01/23/2017 0920   ALT 25 11/18/2013 1030   BILITOT 0.7 01/23/2017 0920   BILITOT 0.3 11/18/2013 1030     IMPRESSION: 1. Further enlargement of pathologic adenopathy in the neck, chest, abdomen, and pelvis, but with similar Deauville 4 level of metabolic activity compared to the prior exam, compatible with active lymphoma. No bony involvement or splenomegaly identified. 2. 6 by 4 mm left upper lobe pulmonary nodule appears to be new and does not have hypermetabolic activity currently, surveillance suggested. 3. Several findings of indeterminate significance include some bandlike probable atelectasis or scarring medially in the right lower lobe with faintly accentuated activity, as well as some focal subcutaneous activity in stranding in the right lower quadrant. 4. Other imaging findings of potential clinical significance: Aortic Atherosclerosis (ICD10-I70.0). Right renal cysts. Coronary atherosclerosis.   Electronically Signed   By: Van Clines M.D.   On: 11/30/2016 14:42  ASSESSMENT & PLAN:   Small B-cell lymphoma of intra-abdominal lymph nodes ([place]) # SLL/CLL- stage IV  SEP 2018- PET scan progression in neck bilaterally;  pelvis [increase in right pelvic LN]; stable mediastinal  LN. poor tolerance to ibrutinib-easy bruising/severe anemia [hemoglobin 6.7].  #Since stopping ibrutinib-improvement of anemia/easy bruising.  Recommend discontinuation at this time.  # Long discussion regarding-other treatment options including single agent Rituxan; Gazyva. However, patient concerned about "pneumonias"/infections.  He is reluctant with any systemic therapy.  Discussed regarding radiation to the involved areas-like left neck if symptomatic.   #Anemia-acute on chronic; nadir 6.6 [?  Secondary to ibrutinib-no evidence of hemolysis]; more recently 9.4-improving.  On Procrit and iron as per nephrology.  # Left eye keratitis-on acyclovir?  Question rash.  Recommend stopping/defer to ophthalmology.  #Hypoxia -needing oxygen; family request portable oxygen.  # skin rash-question allergies.  Recommend topical hydrocortisone.  #Follow-up in 2 months; [labs through nephrology-poor IV access];   Cc; Dr.Kolluru.     No orders of the defined types were placed in this encounter.      Cammie Sickle, MD 03/22/2017 6:58 PM

## 2017-03-22 NOTE — Assessment & Plan Note (Addendum)
#   SLL/CLL- stage IV  SEP 2018- PET scan progression in neck bilaterally;  pelvis [increase in right pelvic LN]; stable mediastinal LN. poor tolerance to ibrutinib-easy bruising/severe anemia [hemoglobin 6.7].  #Since stopping ibrutinib-improvement of anemia/easy bruising.  Recommend discontinuation at this time.  # Long discussion regarding-other treatment options including single agent Rituxan; Gazyva. However, patient concerned about "pneumonias"/infections.  He is reluctant with any systemic therapy.  Discussed regarding radiation to the involved areas-like left neck if symptomatic.   #Anemia-acute on chronic; nadir 6.6 [?  Secondary to ibrutinib-no evidence of hemolysis]; more recently 9.4-improving.  On Procrit and iron as per nephrology.  # Left eye keratitis-on acyclovir?  Question rash.  Recommend stopping/defer to ophthalmology.  #Hypoxia -needing oxygen; family request portable oxygen.  # skin rash-question allergies.  Recommend topical hydrocortisone.  #Follow-up in 2 months; [labs through nephrology-poor IV access];   Cc; Dr.Kolluru.

## 2017-04-09 ENCOUNTER — Telehealth: Payer: Self-pay | Admitting: *Deleted

## 2017-04-09 MED ORDER — AZITHROMYCIN 250 MG PO TABS: ORAL_TABLET | ORAL | 0 refills | Status: DC

## 2017-04-09 NOTE — Telephone Encounter (Signed)
Ok to send zpak to pt's pharmacy v/o Dr. Rogue Bussing

## 2017-04-09 NOTE — Telephone Encounter (Addendum)
Temp 101 this morning, he has been around sick people all weekend has nasal congestion (pressure in head), coughing, feels weak, Asking for Zpak stating that it usually gets rid of it. Please advise

## 2017-04-09 NOTE — Telephone Encounter (Signed)
[name] informed of prescription sent to pharmacy

## 2017-04-10 ENCOUNTER — Encounter: Payer: Self-pay | Admitting: Podiatry

## 2017-04-10 ENCOUNTER — Ambulatory Visit: Payer: Medicare HMO | Admitting: Podiatry

## 2017-04-10 DIAGNOSIS — E0842 Diabetes mellitus due to underlying condition with diabetic polyneuropathy: Secondary | ICD-10-CM

## 2017-04-10 DIAGNOSIS — M79676 Pain in unspecified toe(s): Secondary | ICD-10-CM | POA: Diagnosis not present

## 2017-04-10 DIAGNOSIS — B351 Tinea unguium: Secondary | ICD-10-CM | POA: Diagnosis not present

## 2017-04-10 DIAGNOSIS — L989 Disorder of the skin and subcutaneous tissue, unspecified: Secondary | ICD-10-CM | POA: Diagnosis not present

## 2017-04-10 MED ORDER — SILVER SULFADIAZINE 1 % EX CREA: 1.0000 "application " | TOPICAL_CREAM | Freq: Every day | CUTANEOUS | 2 refills | Status: DC

## 2017-04-12 ENCOUNTER — Other Ambulatory Visit: Payer: Self-pay | Admitting: Podiatry

## 2017-04-12 NOTE — Progress Notes (Signed)
    Subjective: Patient is a 72 y.o. male presenting to the office today with a chief complaint of a painful callus lesion to the sub-fifth MPJ of the left foot that has been present for several months.  Patient also complains of elongated, thickened nails that cause pain while ambulating in shoes. Patient is unable to trim their own nails. Patient presents today for further treatment and evaluation.  Past Medical History:  Diagnosis Date  . Anemia   . Arthritis   . Bladder cancer ([place])   . Chronic kidney disease   . Diabetes mellitus without complication ([place])   . Dialysis patient [facility])    M,W,F  . Dyspnea    DOE  . GERD (gastroesophageal reflux disease)   . HOH (hard of hearing)   . Hypertension   . IBS (irritable bowel syndrome)   . Lymphoma ([place]) 09/27/2014  . Neuropathy    RIGHT LEG  . Stroke [facility])    TIA    Objective:  Physical Exam General: Alert and oriented x3 in no acute distress  Dermatology: Hyperkeratotic lesion present on the sub-fifth MPJ of the left foot. Pain on palpation with a central nucleated core noted. Skin is warm, dry and supple bilateral lower extremities. Negative for open lesions or macerations. Nails are tender, long, thickened and dystrophic with subungual debris, consistent with onychomycosis, 1-5 bilateral. No signs of infection noted.  Vascular: Palpable pedal pulses bilaterally. No edema or erythema noted. Capillary refill within normal limits.  Neurological: Epicritic and protective threshold grossly intact bilaterally.   Musculoskeletal Exam: Pain on palpation at the keratotic lesion noted. Range of motion within normal limits bilateral. Muscle strength 5/5 in all groups bilateral.  Assessment: 1. Onychodystrophic nails 1-5 bilateral with hyperkeratosis of nails.  2. Onychomycosis of nail due to dermatophyte bilateral 3. Porokeratosis to the sub-fifth MPJ of the left foot   Plan of Care:  #1 Patient evaluated. #2 Excisional  debridement of keratoic lesion using a chisel blade was performed without incident.  #3 Dressed with light dressing. #4 Mechanical debridement of nails 1-5 bilaterally performed using a nail nipper. Filed with dremel without incident.  #5 Refill prescription for Silvadene cream provided to patient.  #6 Patient is to return to the clinic in 3 months.   Edrick Kins, DPM [facility]  Dr. Edrick Kins, [address]                                        [place], [place] [address]                Office [phone]  Fax [phone]

## 2017-05-18 ENCOUNTER — Telehealth: Payer: Self-pay | Admitting: *Deleted

## 2017-05-18 NOTE — Telephone Encounter (Signed)
Patient daughterVanessa called wanting to Cancel his 05/24/17 appt She stated that he has been in [facility] and needed to  Reschedule he's appt to 06/07/17 with Dr.B

## 2017-05-24 ENCOUNTER — Ambulatory Visit: Payer: Medicare HMO | Admitting: Internal Medicine

## 2017-06-06 ENCOUNTER — Telehealth: Payer: Self-pay | Admitting: *Deleted

## 2017-06-06 NOTE — Telephone Encounter (Signed)
Patient's daughter called to verify the time of her father's appt on 06/07/17 She stated that someone called her and changed the time of her father's appt But didn't address who they were. She was made aware of the time change. And also stated that she had some concerns about how her father's appts  were being scheduled. And that she would address those concerns on 06/07/17

## 2017-06-07 ENCOUNTER — Inpatient Hospital Stay: Payer: Medicare HMO | Attending: Internal Medicine | Admitting: Internal Medicine

## 2017-06-07 ENCOUNTER — Encounter: Payer: Self-pay | Admitting: Internal Medicine

## 2017-06-07 ENCOUNTER — Other Ambulatory Visit: Payer: Self-pay

## 2017-06-07 VITALS — BP 160/73 | HR 66 | Temp 97.9°F | Resp 20 | Ht 70.0 in | Wt 201.1 lb

## 2017-06-07 DIAGNOSIS — N186 End stage renal disease: Secondary | ICD-10-CM | POA: Diagnosis not present

## 2017-06-07 DIAGNOSIS — Z992 Dependence on renal dialysis: Secondary | ICD-10-CM | POA: Insufficient documentation

## 2017-06-07 DIAGNOSIS — C8303 Small cell B-cell lymphoma, intra-abdominal lymph nodes: Secondary | ICD-10-CM | POA: Diagnosis present

## 2017-06-07 NOTE — Assessment & Plan Note (Addendum)
#   SLL/CLL- stage IV  SEP 2018- PET scan progression in neck bilaterally;  pelvis [increase in right pelvic LN]; stable mediastinal LN.  # poor tolerance to ibrutinib-easy bruising/severe anemia [hemoglobin 6.7]. Most recent hemoglobin at dialysis is ~10/patient also on Procrit/through nephrology platelets stable.   #Pleural effusion-status post thoracentesis/? 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  Question cause of patient's effusion.  However patient has multiple other causes of pleural effusion.  Patient awaiting evaluation with lymphoma/leukemia Dr. at [facility] end of the month.  I agree keeping the appointment at [facility].  Patient reluctant with systemic therapies/given the risk of infections.  # Left eye keratitis-stable/improved.  # Hypoxia -needing oxygen;currently improved.   #Follow-up in 5 weeks; [labs through nephrology-poor IV access];   Cc; Dr.Kolluru.   Addendum: from dialysis: Labs dated May 28, 2017 hemoglobin 11.1 albumin 4.0 calcium 8.1 potassium 4.3 glucose 256 hemoglobinA1c- 6.0; no platelet count or white count available.

## 2017-06-07 NOTE — Progress Notes (Signed)
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 Bone marrow biopsy on 03/05/13 with mildly hypercellular marrow for age 72-50% with interstitial predominantly small B-lymphocytic infiltrate estimated about 20-30% of marrow cells, mild nonspecific dyserythropoiesis, storage iron present this. Flow cytometry showed 29% CD5+ clonal B-cell population which is CD45+, CD5+, CD19+, CD20+, CD22+, CD23+, CD38+, HLA-DR+, Slg kappa+, and is CD10-, CD11b-, FMC7-. 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 PET scan on 03/03/13 with extensive hypermetabolic lymphadenopathy.  (presented with progressive Thrombocytopenia with persistent Anemia. CBC on 01/16/13 showed hemoglobin 10.0, MCV 98, platelets 80, WBC 4890 with 40% neutrophils, 54% lymphocytes, 1.4% monocytes. On Epogen and Venofer at dialysis treatments. Further workup showed small M-spike of 0.2).   Got Treanda/Rituxan x2 cycles in Dec 2014/Jan 2015. Then on single agent Rituxan q 8 weeks (got #3 dose on 09/16/13). --------------------------------------------------------------- ----------------------  # DEC 2014- SMALL CELL LYMPHOMA/CLL- STAGE IV [BMBx- 03/2013- 20-30% B cell infiltrate; CD 5+/CD 20+]; FISH- Trisomy 12;DEC 2014-Treanda-Rituxan x2 [held sec to prolonged hospital/pneumoani/HD]; Rituxa q8W [last June 2015; Held sec to Superficial Bladder ca]; CT- JUNE 2017- STABLE Mediastinal LN/supraclav LN/pelvic LN; CT DEC 2017- mild progression noted ~1.5-2cm mediastinal/Ax LN. Cont surveillance  # SEP 2018- PROGRESSION on PET scan-  # SEP 27th 2018- Ibrutinib 280 mg/day [after HD]; stopped Jan 2019- sec to severe anemia 6.7/easy bruising; Hb 9-10 off ibrutinib  # 28m RLL lung nodule- F/u in 6 M [Hx of smoking quit  2015]  # ESRD on HD [Dr.Voora/Dr.Kolluru]; DM; Non-healing ulcers in feet/Hx R foot drop- walker ambulation  #Noninvasive Bladder cancer/recent UTIs-currently resolved.     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   [facility].  During dialysis patient became acutely dyspneic/saturations to 40s; unconscious needing brief chest compressions-return to baseline mental status/normal oxygen saturations. Noted to have right-sided pleural effusion for which he had thoracentesis.  Thoracentesis fluid showed-evidence of CLL.  [facility].  Patient states his left neck lump is fairly stable/this is neither getting any better or worse.  This is not extremely bothersome to him; however not very comfortable either.  He continues to deny any blood in his stools, nausea, vomiting. Denies any unusual weight loss or night sweats. No fever or chills.   REVIEW OF SYSTEMS:  A complete 10 point review of system is done which is negative except mentioned above/history of present illness.   PAST MEDICAL HISTORY :  Past Medical History:  Diagnosis Date  . Anemia   . Arthritis   . [place]   . Chronic kidney disease   . Diabetes mellitus without complication ([place]   . Dialysis patient ([facility]    M,W,F  . Dyspnea    DOE  . GERD (gastroesophageal reflux disease)   . HOH (hard of hearing)   . Hypertension   . IBS (irritable bowel syndrome)   . [place]  . Neuropathy    RIGHT LEG  . Stroke ([facility]    TIA    PAST  SURGICAL HISTORY :   Past Surgical History:  Procedure Laterality Date  . BACK SURGERY  2007  . CATARACT EXTRACTION  W/PHACO Left 03/23/2016   Procedure: CATARACT EXTRACTION PHACO AND INTRAOCULAR LENS PLACEMENT (IOC);  Surgeon: Leandrew Koyanagi, MD;  Location: [facility] ORS;  Service: Ophthalmology;  Laterality: Left;  [place] 52.6 AP% 14.7 CDE 7.72 Fluid pack lot # [id number]  . CATARACT EXTRACTION W/PHACO Right 05/30/2016   Procedure: CATARACT EXTRACTION PHACO AND INTRAOCULAR LENS PLACEMENT ([place]);  Surgeon: Leandrew Koyanagi, MD;  Location: [facility] ORS;  Service: Ophthalmology;  Laterality: Right;  [place] 01:08 AP% 13.9 CDE 9.53  note: could not get IV in patient, so procedure done without anesthesia personell present, ok per Dr Wallace Going fluid pack lo t# [id number]  . CIRCUMCISION    . PERIPHERAL VASCULAR CATHETERIZATION Left 08/19/2015   Procedure: A/V Shuntogram/Fistulagram;  Surgeon: Algernon Huxley, MD;  Location: [place] [facility];  Service: Cardiovascular;  Laterality: Left;  . PERIPHERAL VASCULAR CATHETERIZATION N/A 08/19/2015   Procedure: A/V Shunt Intervention;  Surgeon: Algernon Huxley, MD;  Location: [place] [facility] LAB;  Service: Cardiovascular;  Laterality: N/A;    FAMILY HISTORY :   Family History  Problem Relation Age of Onset  . Cancer Mother   . Kidney cancer Neg Hx   . Prostate cancer Neg Hx   . Kidney failure Neg Hx   . Bladder Cancer Neg Hx     SOCIAL HISTORY:   Social History   Tobacco Use  . Smoking status: Former Smoker    Types: Cigarettes    Last attempt to quit: 05/19/2013    Years since quitting: 4.0  . Smokeless tobacco: Never Used  . Tobacco comment: quit  Substance Use Topics  . Alcohol use: No    Alcohol/week: 0.0 oz  . Drug use: No    ALLERGIES:  is allergic to ibuprofen; multivitamin [centrum]; daypro [oxaprozin]; and tape.  MEDICATIONS:  Current Outpatient Medications  Medication Sig Dispense Refill  . amLODipine (NORVASC) 10 MG tablet Take 10 mg by mouth daily.     [name] aspirin 81 MG EC tablet Take 81 mg by mouth daily.     . calcium acetate (PHOSLO) 667 MG capsule  Take 1,334-2,001 mg by mouth See admin instructions. 3 caps with meals and 2 caps with snacks    . Difluprednate (DUREZOL) 0.05 % EMUL Place 1 drop into the left eye 4 (four) times daily.    [name] ENSURE PLUS (ENSURE PLUS) LIQD Take by mouth 3 (three) times a week. M,W.F    . fluticasone (FLONASE) 50 MCG/ACT nasal spray Place 1 spray into both nostrils daily as needed for allergies.     . furosemide (LASIX) 80 MG tablet Take 80-160 mg by mouth See admin instructions. 2 tablets in am- on sat, Sunday, Tuesday and Thursday 1 tablet in PM- on Sundays -Saturdays    . gabapentin (NEURONTIN) 100 MG capsule Take 1 capsule (100 mg total) by mouth 3 (three) times daily. (Patient taking differently: Take 200-300 mg by mouth 2 (two) times daily. ) 90 capsule 0  . LEVEMIR FLEXTOUCH 100 UNIT/ML Pen Inject 25-30 Units into the skin daily at 10 pm. Depending on what he eats    . lovastatin (MEVACOR) 20 MG tablet Take 1 tablet by mouth daily.    . metoCLOPramide (REGLAN) 5 MG tablet Take 5 mg by mouth 3 (three) times daily before meals. Patient takes 1 tab with each meal.    . metoprolol tartrate (LOPRESSOR) 100  MG tablet Take 25 mg by mouth See admin instructions. Pt takes Tues morning, Friday Night, and Saturday morning and evening    . multivitamin (RENA-VIT) TABS tablet Take 1 tablet by mouth at bedtime.     [name] nystatin cream (MYCOSTATIN) Apply 1 application topically 2 (two) times daily.    . silver sulfADIAZINE (SILVADENE) 1 % cream APPLY 1 APPLICATION TOPICALY DAILY 50 g 1  . albuterol (PROVENTIL HFA;VENTOLIN HFA) 108 (90 Base) MCG/ACT inhaler Inhale 2 puffs into the lungs every 4 (four) hours as needed for wheezing or shortness of breath. (Patient not taking: Reported on 06/07/2017) 1 Inhaler 2  . glimepiride (AMARYL) 4 MG tablet Take 8 mg by mouth at bedtime. Prn glucose levels     No current facility-administered medications for this visit.     PHYSICAL EXAMINATION: ECOG PERFORMANCE STATUS: 3  BP (!) 160/73  (BP Location: Right Arm, Patient Position: Sitting)   Pulse 66   Temp 97.9 F (36.6 C) (Tympanic)   Resp 20   Ht '5\' 10"'  (1.778 m)   Wt 201 lb 1.6 oz (91.2 kg)   BMI 28.85 kg/m   Filed Weights   06/07/17 1154  Weight: 201 lb 1.6 oz (91.2 kg)    GENERAL: Well-nourished well-developed; Alert, no distress and comfortable. Accompanied by Family.  He is in a wheelchair. EYES: no pallor or icterus; positive for injection of the left eye OROPHARYNX: no thrush or ulceration; poor dentition.  NECK: supple, no masses felt LYMPH:  Bilateral axillary lymph nodes no longer Palpable. Approximately 3-4 cm cm LN felt in left neck.  LUNGS: clear to auscultation and  No wheeze or crackles HEART/CVS: regular rate & rhythm and no murmurs; No lower extremity edema;  R-LE in brace.  ABDOMEN:abdomen soft, non-tender and normal bowel sounds Musculoskeletal:no cyanosis of digits and no clubbing  PSYCH: alert & oriented x 3 with fluent speech NEURO: no focal motor/sensory deficits SKIN: Mild skin rash noted-macular left calf.  LABORATORY DATA:  I have reviewed the data as listed    Component Value Date/Time   NA 134 (L) 01/23/2017 0920   NA 140 11/18/2013 1030   K 3.9 01/23/2017 0920   K 4.0 11/18/2013 1030   CL 95 (L) 01/23/2017 0920   CL 97 (L) 11/18/2013 1030   CO2 24 01/23/2017 0920   CO2 33 (H) 11/18/2013 1030   GLUCOSE 121 (H) 01/23/2017 0920   GLUCOSE 265 (H) 11/18/2013 1030   BUN 47 (H) 01/23/2017 0920   BUN 30 (H) 11/18/2013 1030   CREATININE 5.80 (H) 01/23/2017 0920   CREATININE 5.05 (H) 11/18/2013 1030   CALCIUM 7.9 (L) 01/23/2017 0920   CALCIUM 8.6 11/18/2013 1030   PROT 6.3 (L) 01/23/2017 0920   PROT 7.0 11/18/2013 1030   ALBUMIN 3.6 01/23/2017 0920   ALBUMIN 3.7 11/18/2013 1030   AST 18 01/23/2017 0920   AST 15 11/18/2013 1030   ALT 25 01/23/2017 0920   ALT 25 11/18/2013 1030   ALKPHOS 59 01/23/2017 0920   ALKPHOS 105 11/18/2013 1030   BILITOT 0.7 01/23/2017 0920    BILITOT 0.3 11/18/2013 1030   GFRNONAA 9 (L) 01/23/2017 0920   GFRNONAA 11 (L) 11/18/2013 1030   GFRAA 10 (L) 01/23/2017 0920   GFRAA 13 (L) 11/18/2013 1030    No results found for: SPEP, UPEP  Lab Results  Component Value Date   WBC 6.6 02/01/2017   NEUTROABS 5.1 02/01/2017   HGB 6.7 (L) 02/01/2017   HCT 20.2 (  L) 02/01/2017   MCV 100.1 (H) 02/01/2017   PLT 131 (L) 02/01/2017      Chemistry      Component Value Date/Time   NA 134 (L) 01/23/2017 0920   NA 140 11/18/2013 1030   K 3.9 01/23/2017 0920   K 4.0 11/18/2013 1030   CL 95 (L) 01/23/2017 0920   CL 97 (L) 11/18/2013 1030   CO2 24 01/23/2017 0920   CO2 33 (H) 11/18/2013 1030   BUN 47 (H) 01/23/2017 0920   BUN 30 (H) 11/18/2013 1030   CREATININE 5.80 (H) 01/23/2017 0920   CREATININE 5.05 (H) 11/18/2013 1030      Component Value Date/Time   CALCIUM 7.9 (L) 01/23/2017 0920   CALCIUM 8.6 11/18/2013 1030   ALKPHOS 59 01/23/2017 0920   ALKPHOS 105 11/18/2013 1030   AST 18 01/23/2017 0920   AST 15 11/18/2013 1030   ALT 25 01/23/2017 0920   ALT 25 11/18/2013 1030   BILITOT 0.7 01/23/2017 0920   BILITOT 0.3 11/18/2013 1030     IMPRESSION: 1. Further enlargement of pathologic adenopathy in the neck, chest, abdomen, and pelvis, but with similar Deauville 4 level of metabolic activity compared to the prior exam, compatible with active lymphoma. No bony involvement or splenomegaly identified. 2. 6 by 4 mm left upper lobe pulmonary nodule appears to be new and does not have hypermetabolic activity currently, surveillance suggested. 3. Several findings of indeterminate significance include some bandlike probable atelectasis or scarring medially in the right lower lobe with faintly accentuated activity, as well as some focal subcutaneous activity in stranding in the right lower quadrant. 4. Other imaging findings of potential clinical significance: Aortic Atherosclerosis (ICD10-I70.0). Right renal cysts.  Coronary atherosclerosis.   Electronically Signed   By: Van Clines M.D.   On: 11/30/2016 14:42  ASSESSMENT & PLAN:   Small B-cell lymphoma of intra-abdominal lymph nodes ([place]) # SLL/CLL- stage IV  SEP 2018- PET scan progression in neck bilaterally;  pelvis [increase in right pelvic LN]; stable mediastinal LN.  # poor tolerance to ibrutinib-easy bruising/severe anemia [hemoglobin 6.7]. Most recent hemoglobin at dialysis is ~10/patient also on Procrit/through nephrology platelets stable.   #Pleural effusion-status post thoracentesis/? 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  Question cause of patient's effusion.  However patient has multiple other causes of pleural effusion.  Patient awaiting evaluation with lymphoma/leukemia Dr. at [facility] end of the month.  I agree keeping the appointment at [facility].  Patient reluctant with systemic therapies/given the risk of infections.  # Left eye keratitis-stable/improved.  # Hypoxia -needing oxygen;currently improved.   #Follow-up in 5 weeks; [labs through nephrology-poor IV access];   Cc; Dr.Kolluru.     No orders of the defined types were placed in this encounter.      Cammie Sickle, MD 06/19/2017 4:47 PM

## 2017-06-26 ENCOUNTER — Telehealth: Payer: Self-pay | Admitting: Internal Medicine

## 2017-06-26 ENCOUNTER — Ambulatory Visit (INDEPENDENT_AMBULATORY_CARE_PROVIDER_SITE_OTHER): Payer: Medicare HMO | Admitting: Vascular Surgery

## 2017-06-26 ENCOUNTER — Encounter (INDEPENDENT_AMBULATORY_CARE_PROVIDER_SITE_OTHER): Payer: Medicare HMO

## 2017-06-26 NOTE — Telephone Encounter (Signed)
Spoke to Dr.Coombs; recommends re-starting Ibrutinib at 280 mg/day ONLY on the days of dialysis [ after hemoadialysis]. Ask pt to keep his appt with me as planned.   Given prior history of severe anemia on ibrutinib- recommend monitoring   Spoke  to Dr.Kolluru re: checking labs weekly at Hemodialysis. [weekly cbc/ldh]; and HAPTOGLOBIN if Hb dropping.

## 2017-06-26 NOTE — Telephone Encounter (Signed)
Left message for Dr. Prince Solian to call me back. Dr.B

## 2017-06-27 ENCOUNTER — Telehealth: Payer: Self-pay | Admitting: Internal Medicine

## 2017-06-27 NOTE — Telephone Encounter (Signed)
Spoke with patient - instructions for ibrutinib were discussed. Patient states that he was "already aware of the plan of care before this phone call."

## 2017-06-27 NOTE — Telephone Encounter (Signed)
Oral Oncology Patient Advocate Encounter  Received notification from [facility] that prior authorization for Imbruvica is required.  PA submitted on CoverMyMeds REF# [id number] Status is pending  Oral Oncology Clinic will continue to follow.   [place] Patient Advocate [phone] 06/27/2017 10:02 AM

## 2017-06-29 NOTE — Telephone Encounter (Signed)
Oral Chemotherapy Pharmacist Encounter   I spoke with [name], the patient's daughter, and she said Mark [name] has a full supply of his Imbruvica (ibrutinib) at home. With [name] new administration instruction, Take MWF following dialysis. This current supply should last the patient about 2 months. I asked [name] to let us know when her father has about 1 week left of medication so we can get him set up with the pharmacy again.    Darl Pikes, PharmD, BCPS Hematology/Oncology Clinical Pharmacist [facility] [phone]  06/29/2017 8:41 AM

## 2017-07-02 ENCOUNTER — Telehealth: Payer: Self-pay | Admitting: Pharmacist

## 2017-07-02 NOTE — Telephone Encounter (Signed)
Oral Chemotherapy Pharmacist Encounter   Called patient to review medication calendar I am going to place in the mail for him. No answer LVM for patient to call me back.   Darl Pikes, PharmD, BCPS Hematology/Oncology Clinical Pharmacist [facility] [phone]  07/02/2017 9:40 AM

## 2017-07-10 ENCOUNTER — Ambulatory Visit: Payer: Medicare HMO | Admitting: Podiatry

## 2017-07-10 ENCOUNTER — Encounter: Payer: Self-pay | Admitting: Podiatry

## 2017-07-10 DIAGNOSIS — B351 Tinea unguium: Secondary | ICD-10-CM | POA: Diagnosis not present

## 2017-07-10 DIAGNOSIS — E0842 Diabetes mellitus due to underlying condition with diabetic polyneuropathy: Secondary | ICD-10-CM | POA: Diagnosis not present

## 2017-07-10 DIAGNOSIS — M79676 Pain in unspecified toe(s): Secondary | ICD-10-CM | POA: Diagnosis not present

## 2017-07-10 DIAGNOSIS — L84 Corns and callosities: Secondary | ICD-10-CM

## 2017-07-11 NOTE — Progress Notes (Signed)
    Subjective: Patient is a 72 y.o. male with PMHx of DM presenting to the office today with a chief complaint of a painful callus lesion to the sub-fifth MPJ of the left foot that has been present for several months. Walking and bearing weight increase the pain.   Patient also complains of elongated, thickened nails that cause pain while ambulating in shoes. He is unable to trim his own nails. Patient presents today for further treatment and evaluation.  Past Medical History:  Diagnosis Date  . Anemia   . Arthritis   . Bladder cancer ([place])   . Chronic kidney disease   . Diabetes mellitus without complication ([place])   . Dialysis patient [facility])    M,W,F  . Dyspnea    DOE  . GERD (gastroesophageal reflux disease)   . HOH (hard of hearing)   . Hypertension   . IBS (irritable bowel syndrome)   . Lymphoma ([place]) 09/27/2014  . Neuropathy    RIGHT LEG  . Stroke [facility])    TIA    Objective:  Physical Exam General: Alert and oriented x3 in no acute distress  Dermatology: Hyperkeratotic lesion present on the sub-fifth MPJ of the left foot. Pain on palpation with a central nucleated core noted. Skin is warm, dry and supple bilateral lower extremities. Negative for open lesions or macerations. Nails are tender, long, thickened and dystrophic with subungual debris, consistent with onychomycosis, 1-5 bilateral. No signs of infection noted.  Vascular: Palpable pedal pulses bilaterally. No edema or erythema noted. Capillary refill within normal limits.  Neurological: Epicritic and protective threshold diminished bilaterally.   Musculoskeletal Exam: Pain on palpation at the keratotic lesion noted. Range of motion within normal limits bilateral. Muscle strength 5/5 in all groups bilateral.  Assessment: 1. Onychodystrophic nails 1-5 bilateral with hyperkeratosis of nails.  2. Onychomycosis of nail due to dermatophyte bilateral 3. Pre-ulcerative callus lesion noted to the sub-fifth MPJ of the left  foot    Plan of Care:  1. Patient evaluated. 2. Excisional debridement of keratoic lesion using a chisel blade was performed without incident.  3. Dressed with light dressing. 4. Mechanical debridement of nails 1-5 bilaterally performed using a nail nipper. Filed with dremel without incident.  5. Prescription provided for DM shoes and insoles to take to [facility].  6. Patient is to return to the clinic in 3 months.   Edrick Kins, DPM [facility]  Dr. Edrick Kins, [address]                                        [place], [place] [address]                Office [phone]  Fax [phone]

## 2017-07-12 ENCOUNTER — Inpatient Hospital Stay: Payer: Medicare HMO | Admitting: Internal Medicine

## 2017-07-17 ENCOUNTER — Encounter (INDEPENDENT_AMBULATORY_CARE_PROVIDER_SITE_OTHER): Payer: Self-pay | Admitting: Vascular Surgery

## 2017-07-17 ENCOUNTER — Ambulatory Visit (INDEPENDENT_AMBULATORY_CARE_PROVIDER_SITE_OTHER): Payer: Medicare HMO

## 2017-07-17 ENCOUNTER — Ambulatory Visit (INDEPENDENT_AMBULATORY_CARE_PROVIDER_SITE_OTHER): Payer: Medicare HMO | Admitting: Vascular Surgery

## 2017-07-17 VITALS — BP 148/66 | HR 55 | Resp 16 | Ht 70.0 in | Wt 191.8 lb

## 2017-07-17 DIAGNOSIS — Z992 Dependence on renal dialysis: Secondary | ICD-10-CM | POA: Diagnosis not present

## 2017-07-17 DIAGNOSIS — E1129 Type 2 diabetes mellitus with other diabetic kidney complication: Secondary | ICD-10-CM

## 2017-07-17 DIAGNOSIS — N186 End stage renal disease: Secondary | ICD-10-CM

## 2017-07-17 DIAGNOSIS — I10 Essential (primary) hypertension: Secondary | ICD-10-CM | POA: Diagnosis not present

## 2017-07-17 NOTE — Assessment & Plan Note (Signed)
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 We will stretch out his follow-up and see him back in about 9 months.  I will be happy to see him sooner if any problems develop in the interim.

## 2017-07-17 NOTE — Progress Notes (Signed)
MRN : [id number]  Mark [name] [name]. is a 72 y.o. ([birth year]) male who presents with chief complaint of  Chief Complaint  Patient presents with  . Follow-up    29mo HDA  .  History of Present Illness: Patient returns today in follow up of his dialysis access.  It is working reasonably well.  He does report some occasional prolonged bleeding.  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  Current Outpatient Medications  Medication Sig Dispense Refill  . amLODipine (NORVASC) 10 MG tablet Take 10 mg by mouth daily.     [name] aspirin 81 MG EC tablet Take 81 mg by mouth daily.     . calcium acetate (PHOSLO) 667 MG capsule Take 1,334-2,001 mg by mouth See admin instructions. 3 caps with meals and 2 caps with snacks    . Difluprednate (DUREZOL) 0.05 % EMUL Place 1 drop into the left eye 4 (four) times daily.    [name] ENSURE PLUS (ENSURE PLUS) LIQD Take by mouth 3 (three) times a week. M,W.F    . fluticasone (FLONASE) 50 MCG/ACT nasal spray Place 1 spray into both nostrils daily as needed for allergies.     . furosemide (LASIX) 80 MG tablet Take 80-160 mg by mouth See admin instructions. 2 tablets in am- on sat, Sunday, Tuesday and Thursday 1 tablet in PM- on Sundays -Saturdays    . gabapentin (NEURONTIN) 100 MG capsule Take 1 capsule (100 mg total) by mouth 3 (three) times daily. (Patient taking differently: Take 200-300 mg by mouth 2 (two) times daily. ) 90 capsule 0  . glimepiride (AMARYL) 4 MG tablet Take 8 mg by mouth at bedtime. Prn glucose levels    . LEVEMIR FLEXTOUCH 100 UNIT/ML Pen Inject 25-30 Units into the skin daily at 10 pm. Depending on what he eats    . lovastatin (MEVACOR) 20 MG tablet Take 1 tablet by mouth daily.    . metoCLOPramide (REGLAN) 5 MG tablet Take 5 mg by mouth 3 (three) times daily before meals. Patient takes 1 tab with each meal.    . metoprolol tartrate (LOPRESSOR) 100 MG tablet  Take 25 mg by mouth See admin instructions. Pt takes Tues morning, Friday Night, and Saturday morning and evening    . multivitamin (RENA-VIT) TABS tablet Take 1 tablet by mouth at bedtime.     [name] nystatin cream (MYCOSTATIN) Apply 1 application topically 2 (two) times daily.    . silver sulfADIAZINE (SILVADENE) 1 % cream APPLY 1 APPLICATION TOPICALY DAILY 50 g 1  . albuterol (PROVENTIL HFA;VENTOLIN HFA) 108 (90 Base) MCG/ACT inhaler Inhale 2 puffs into the lungs every 4 (four) hours as needed for wheezing or shortness of breath. (Patient not taking: Reported on 06/07/2017) 1 Inhaler 2   No current facility-administered medications for this visit.     Past Medical History:  Diagnosis Date  . Anemia   . Arthritis   . Bladder cancer ([place])   . Chronic kidney disease   . Diabetes mellitus without complication ([place])   . Dialysis patient [facility])    M,W,F  . Dyspnea    DOE  . GERD (gastroesophageal reflux disease)   . HOH (hard of hearing)   . Hypertension   . IBS (irritable bowel syndrome)   . Lymphoma ([place]) 09/27/2014  . Neuropathy    RIGHT LEG  . Stroke [facility])    TIA    Past  Surgical History:  Procedure Laterality Date  . BACK SURGERY  2007  . CATARACT EXTRACTION W/PHACO Left 03/23/2016   Procedure: CATARACT EXTRACTION PHACO AND INTRAOCULAR LENS PLACEMENT (IOC);  Surgeon: Leandrew Koyanagi, MD;  Location: [facility] ORS;  Service: Ophthalmology;  Laterality: Left;  [place] 52.6 AP% 14.7 CDE 7.72 Fluid pack lot # [id number]  . CATARACT EXTRACTION W/PHACO Right 05/30/2016   Procedure: CATARACT EXTRACTION PHACO AND INTRAOCULAR LENS PLACEMENT ([place]);  Surgeon: Leandrew Koyanagi, MD;  Location: [facility] ORS;  Service: Ophthalmology;  Laterality: Right;  [place] 01:08 AP% 13.9 CDE 9.53  note: could not get IV in patient, so procedure done without anesthesia personell present, ok per Dr Wallace Going fluid pack lo t# [id number]  . CIRCUMCISION    . PERIPHERAL VASCULAR CATHETERIZATION Left 08/19/2015   Procedure:  A/V Shuntogram/Fistulagram;  Surgeon: Algernon Huxley, MD;  Location: [place] CV LAB;  Service: Cardiovascular;  Laterality: Left;  . PERIPHERAL VASCULAR CATHETERIZATION N/A 08/19/2015   Procedure: A/V Shunt Intervention;  Surgeon: Algernon Huxley, MD;  Location: [place] CV LAB;  Service: Cardiovascular;  Laterality: N/A;    Social History       Social History  Substance Use Topics  . Smoking status: Former Smoker    Types: Cigarettes    Quit date: 05/19/2013  . Smokeless tobacco: Never Used     Comment: quit  . Alcohol use No  No IVDU  Family History      Family History  Problem Relation Age of Onset  . Kidney cancer Neg Hx   . Prostate cancer Neg Hx   . Kidney failure Neg Hx   . Bladder Cancer Neg Hx          Allergies  Allergen Reactions  . Ibuprofen     DUE TO DIALYSIS  . Multivitamin [Centrum]     DUE TO DIALYSIS  . Daypro [Oxaprozin] Swelling and Rash    Other reaction(s): Other (See Comments)     REVIEW OF SYSTEMS(Negative unless checked)  Constitutional: [] Weight loss[] Fever[] Chills Cardiac:[] Chest pain[] Chest pressure[] Palpitations [] Shortness of breath when laying flat [] Shortness of breath at rest [] Shortness of breath with exertion. Vascular: [] Pain in legs with walking[] Pain in legsat rest[] Pain in legs when laying flat [] Claudication [] Pain in feet when walking [] Pain in feet at rest [] Pain in feet when laying flat [] History of DVT [] Phlebitis [] Swelling in legs [] Varicose veins [] Non-healing ulcers Pulmonary: [] Uses home oxygen [] Productive cough[] Hemoptysis [] Wheeze [] COPD [] Asthma Neurologic: [] Dizziness [] Blackouts [] Seizures [x] History of stroke [] History of TIA[] Aphasia [] Temporary blindness[] Dysphagia [] Weaknessor numbness in arms [x] Weakness or numbnessin legs Musculoskeletal: [] Arthritis [] Joint swelling [] Joint pain [] Low  back pain Hematologic:[] Easy bruising[] Easy bleeding [] Hypercoagulable state [] Anemic  Gastrointestinal:[] Blood in stool[] Vomiting blood[] Gastroesophageal reflux/heartburn[] Abdominal pain Genitourinary: [x] Chronic kidney disease [] Difficulturination [] Frequenturination [] Burning with urination[] Hematuria Skin: [] Rashes [] Ulcers [] Wounds Psychological: [] History of anxiety[] History of major depression.   Physical Examination  BP (!) 148/66 (BP Location: Right Arm)   Pulse (!) 55   Resp 16   Ht 5\' 10"  (1.778 m)   Wt 87 kg (191 lb 12.8 oz)   BMI 27.52 kg/m  Gen:  WD/WN, NAD Head: [place]/AT, No temporalis wasting. Ear/Nose/Throat: Hearing grossly intact, nares w/o erythema or drainage Eyes: Conjunctiva clear. Sclera non-icteric Neck: Supple.  Trachea midline Pulmonary:  Good air movement, no use of accessory muscles.  Cardiac: RRR, no JVD Vascular: Good thrill in left forearm radiocephalic AV fistula with moderate aneurysmal degeneration which is stable Vessel Right Left  Radial Palpable Palpable  Musculoskeletal: M/S 5/5 throughout.  Uses a wheelchair Neurologic: Sensation grossly intact in extremities.  Symmetrical.  Speech is fluent.  Psychiatric: Judgment intact, Mood & affect appropriate for pt's clinical situation. Dermatologic: No rashes or ulcers noted.  No cellulitis or open wounds.       Labs No results found for this or any previous visit (from the past 2160 hour(s)).  Radiology No results found.  Assessment/Plan Diabetes mellitus ([place]) blood glucose control important in reducing the progression of atherosclerotic disease. Also, involved in wound healing. On appropriate medications.   HLD (hyperlipidemia) lipid control important in reducing the progression of atherosclerotic disease. Continue statin therapy   Essential (primary) hypertension blood pressure control important  in reducing the progression of atherosclerotic disease. On appropriate oral medications.   End-stage renal disease on hemodialysis ([place]) His duplex shows a competing branch with aneurysmal changes of his left radiocephalic AV fistula but no focal stenosis was identified. We will stretch out his follow-up and see him back in about 9 months.  I will be happy to see him sooner if any problems develop in the interim.    Leotis Pain, MD  07/17/2017 2:18 PM    This note was created with Dragon medical transcription system.  Any errors from dictation are purely unintentional

## 2017-08-06 ENCOUNTER — Telehealth: Payer: Self-pay | Admitting: *Deleted

## 2017-08-06 NOTE — Telephone Encounter (Signed)
Contacted patient. Left vm for patient/pt's family to return cancer center's phone call. Patient's daughter cnl the last apt in c.c.tr on 4/11 and did not r/s.  Left msg for pt- to contact cancer center to r/s pt's apt.  If interested in f/u- per md- set f/u apt in 3 week (no labs)

## 2017-08-23 ENCOUNTER — Inpatient Hospital Stay: Payer: Medicare HMO | Attending: Internal Medicine | Admitting: Internal Medicine

## 2017-08-23 DIAGNOSIS — C8303 Small cell B-cell lymphoma, intra-abdominal lymph nodes: Secondary | ICD-10-CM

## 2017-08-23 DIAGNOSIS — N186 End stage renal disease: Secondary | ICD-10-CM | POA: Diagnosis not present

## 2017-08-23 DIAGNOSIS — Z79899 Other long term (current) drug therapy: Secondary | ICD-10-CM | POA: Diagnosis not present

## 2017-08-23 DIAGNOSIS — Z87891 Personal history of nicotine dependence: Secondary | ICD-10-CM | POA: Diagnosis not present

## 2017-08-23 DIAGNOSIS — R5383 Other fatigue: Secondary | ICD-10-CM | POA: Diagnosis not present

## 2017-08-23 DIAGNOSIS — H16102 Unspecified superficial keratitis, left eye: Secondary | ICD-10-CM | POA: Insufficient documentation

## 2017-08-23 DIAGNOSIS — Z992 Dependence on renal dialysis: Secondary | ICD-10-CM | POA: Diagnosis not present

## 2017-08-23 NOTE — Assessment & Plan Note (Addendum)
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  Clinical response noted with improvement of the neck adenopathy.  Patient tolerating well.  Hemoglobin 11 white count normal platelets 106. Continue current therapy.   #End-stage renal disease on hemodialysis; tolerating well.  Stable.  # Left eye keratitis-stable/improved.  #Age-appropriate screening: PSA blood draw [with Dr.Kolluru]; check/okay to hold off screening colonoscopy given multiple medical problems/patient reluctance.   # Follow up in 2 months/labs with HD.  Cc; Dr.Kolluru.

## 2017-08-23 NOTE — Progress Notes (Signed)
[place] OFFICE PROGRESS NOTE  Patient Care Team: Albina Billet, MD as PCP - General (Internal Medicine)  Cancer Staging No matching staging information was found for the patient.   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 Bone marrow biopsy on 03/05/13 with mildly hypercellular marrow for age 72-50% with interstitial predominantly small B-lymphocytic infiltrate estimated about 20-30% of marrow cells, mild nonspecific dyserythropoiesis, storage iron present this. Flow cytometry showed 29% CD5+ clonal B-cell population which is CD45+, CD5+, CD19+, CD20+, CD22+, CD23+, CD38+, HLA-DR+, Slg kappa+, and is CD10-, CD11b-, FMC7-. 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 PET scan on 03/03/13 with extensive hypermetabolic lymphadenopathy.  (presented with progressive Thrombocytopenia with persistent Anemia. CBC on 01/16/13 showed hemoglobin 10.0, MCV 98, platelets 80, WBC 4890 with 40% neutrophils, 54% lymphocytes, 1.4% monocytes. On Epogen and Venofer at dialysis treatments. Further workup showed small M-spike of 0.2).   Got Treanda/Rituxan x2 cycles in Dec 2014/Jan 2015. Then on single agent Rituxan q 8 weeks (got #3 dose on 09/16/13). --------------------------------------------------------------- ----------------------  # DEC 2014- SMALL CELL LYMPHOMA/CLL- STAGE IV [BMBx- 03/2013- 20-30% B cell infiltrate; CD 5+/CD 20+]; FISH- Trisomy 12;DEC 2014-Treanda-Rituxan x2 [held sec to prolonged hospital/pneumoani/HD]; Rituxa q8W [last June 2015; Held sec to Superficial Bladder ca]; CT- JUNE 2017- STABLE Mediastinal LN/supraclav LN/pelvic LN; CT DEC 2017- mild progression noted ~1.5-2cm mediastinal/Ax LN. Cont surveillance  # SEP 2018- PROGRESSION on PET scan-  # SEP 27th 2018- Ibrutinib 280 mg/day [after HD]; stopped Jan 2019- sec to severe anemia 6.7/easy bruising; Hb 9-10 off ibrutinib; II opinion at  [facility]/Dr.Coombs  # 72m RLL lung nodule- F/u in 6 M [Hx of smoking quit 2015]  # ESRD on HD [Dr.Voora/Dr.Kolluru]; DM; Non-healing ulcers in feet/Hx R foot drop- walker ambulation  #Noninvasive Bladder cancer/recent UTIs-currently resolved. 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  male pleasant patient above history of CLL/SLL currently on ibrutinib 280 mg 3 times a day postdialysis is here for follow-up.  Patient has been doing well.  Complains of mild to moderate fatigue.  Otherwise is any fever chills or shortness of breath cough.  His lymph nodes in the neck are improving.  Review of Systems  Constitutional: Positive for malaise/fatigue. Negative for chills, diaphoresis, fever and weight loss.  HENT: Negative for nosebleeds and sore throat.   Eyes: Negative for double vision.  Respiratory: Positive for shortness of breath. Negative for cough, hemoptysis, sputum production and wheezing.   Cardiovascular: Negative for chest pain, palpitations, orthopnea and leg swelling.  Gastrointestinal: Negative for abdominal pain, blood in stool, constipation, diarrhea, heartburn, melena, nausea and vomiting.  Genitourinary: Negative for dysuria, frequency and urgency.  Musculoskeletal: Negative for back pain and joint pain.  Skin: Negative.  Negative for itching and rash.  Neurological: Negative for dizziness, tingling, focal weakness, weakness and headaches.  Endo/Heme/Allergies: Does not bruise/bleed easily.  Psychiatric/Behavioral: Negative for depression. The patient is not nervous/anxious and does not have insomnia.       PAST MEDICAL HISTORY :  Past Medical History:  Diagnosis Date  . Anemia   .  Arthritis   . Bladder cancer ([place]   . Chronic kidney disease   .  Diabetes mellitus without complication ([place])   . Dialysis patient [facility])    M,W,F  . Dyspnea    DOE  . GERD (gastroesophageal reflux disease)   . HOH (hard of hearing)   . Hypertension   . IBS (irritable bowel syndrome)   . Lymphoma ([place]) 09/27/2014  . Neuropathy    RIGHT LEG  . Stroke [facility])    TIA    PAST SURGICAL HISTORY :   Past Surgical History:  Procedure Laterality Date  . BACK SURGERY  2007  . CATARACT EXTRACTION W/PHACO Left 03/23/2016   Procedure: CATARACT EXTRACTION PHACO AND INTRAOCULAR LENS PLACEMENT (IOC);  Surgeon: Leandrew Koyanagi, MD;  Location: [facility] ORS;  Service: Ophthalmology;  Laterality: Left;  [place] 52.6 AP% 14.7 CDE 7.72 Fluid pack lot # [id number]  . CATARACT EXTRACTION W/PHACO Right 05/30/2016   Procedure: CATARACT EXTRACTION PHACO AND INTRAOCULAR LENS PLACEMENT ([place]);  Surgeon: Leandrew Koyanagi, MD;  Location: [facility] ORS;  Service: Ophthalmology;  Laterality: Right;  [place] 01:08 AP% 13.9 CDE 9.53  note: could not get IV in patient, so procedure done without anesthesia personell present, ok per Dr Wallace Going fluid pack lo t# [id number]  . CIRCUMCISION    . PERIPHERAL VASCULAR CATHETERIZATION Left 08/19/2015   Procedure: A/V Shuntogram/Fistulagram;  Surgeon: Algernon Huxley, MD;  Location: [place] CV LAB;  Service: Cardiovascular;  Laterality: Left;  . PERIPHERAL VASCULAR CATHETERIZATION N/A 08/19/2015   Procedure: A/V Shunt Intervention;  Surgeon: Algernon Huxley, MD;  Location: [place] CV LAB;  Service: Cardiovascular;  Laterality: N/A;    FAMILY HISTORY :   Family History  Problem Relation Age of Onset  . Cancer Mother   . Kidney cancer Neg Hx   . Prostate cancer Neg Hx   . Kidney failure Neg Hx   . Bladder Cancer Neg Hx     SOCIAL HISTORY:   Social History   Tobacco Use  . Smoking status: Former Smoker    Types: Cigarettes    Last attempt to quit: 05/19/2013    Years since  quitting: 4.2  . Smokeless tobacco: Never Used  . Tobacco comment: quit  Substance Use Topics  . Alcohol use: No    Alcohol/week: 0.0 oz  . Drug use: No    ALLERGIES:  is allergic to ibuprofen; multivitamin [centrum]; daypro [oxaprozin]; and tape.  MEDICATIONS:  Current Outpatient Medications  Medication Sig Dispense Refill  . amLODipine (NORVASC) 10 MG tablet Take 10 mg by mouth daily.     [name] aspirin 81 MG EC tablet Take 81 mg by mouth daily.     [name] ENSURE PLUS (ENSURE PLUS) LIQD Take by mouth 3 (three) times a week. M,W.F    . furosemide (LASIX) 80 MG tablet Take 80-160 mg by mouth See admin instructions. 2 tablets in am- on sat, Sunday, Tuesday and Thursday 1 tablet in PM- on Sundays -Saturdays    . gabapentin (NEURONTIN) 100 MG capsule Take 1 capsule (100 mg total) by mouth 3 (three) times daily. (Patient taking differently: Take 200-300 mg by mouth 2 (two) times daily. ) 90 capsule 0  . glimepiride (AMARYL) 4 MG tablet Take 8 mg by mouth at bedtime. Prn glucose levels    . LEVEMIR FLEXTOUCH 100 UNIT/ML Pen Inject 25-30 Units into the skin daily at 10 pm. Depending on what he eats    . lovastatin (MEVACOR) 20 MG tablet Take 1 tablet by mouth daily.    . multivitamin (RENA-VIT) TABS tablet Take 1 tablet by mouth at  bedtime.     [name] albuterol (PROVENTIL HFA;VENTOLIN HFA) 108 (90 Base) MCG/ACT inhaler Inhale 2 puffs into the lungs every 4 (four) hours as needed for wheezing or shortness of breath. (Patient not taking: Reported on 06/07/2017) 1 Inhaler 2  . calcium acetate (PHOSLO) 667 MG capsule Take 1,334-2,001 mg by mouth See admin instructions. 3 caps with meals and 2 caps with snacks    . Difluprednate (DUREZOL) 0.05 % EMUL Place 1 drop into the left eye 4 (four) times daily.    . fluticasone (FLONASE) 50 MCG/ACT nasal spray Place 1 spray into both nostrils daily as needed for allergies.     [name] metoCLOPramide (REGLAN) 5 MG tablet Take 5 mg by mouth 3 (three) times daily before meals.  Patient takes 1 tab with each meal.    . metoprolol tartrate (LOPRESSOR) 100 MG tablet Take 25 mg by mouth See admin instructions. Pt takes Tues morning, Friday Night, and Saturday morning and evening    . nystatin cream (MYCOSTATIN) Apply 1 application topically 2 (two) times daily.    . silver sulfADIAZINE (SILVADENE) 1 % cream APPLY 1 APPLICATION TOPICALY DAILY (Patient not taking: Reported on 08/23/2017) 50 g 1   No current facility-administered medications for this visit.     PHYSICAL EXAMINATION: ECOG PERFORMANCE STATUS: 2 - Symptomatic, <50% confined to bed  BP 131/67 (BP Location: Right Arm, Patient Position: Sitting)   Pulse 63   Temp (!) 97.5 F (36.4 C) (Tympanic)   Resp 18   Ht '5\' 10"'  (1.778 m)   Wt 197 lb 1.6 oz (89.4 kg)   SpO2 98%   BMI 28.28 kg/m   Filed Weights   08/23/17 1048  Weight: 197 lb 1.6 oz (89.4 kg)    GENERAL: Well-nourished well-developed; Alert, no distress and comfortable. /Accompanied by family. He is in a wheel chair.  EYES: no pallor or icterus OROPHARYNX: no thrush or ulceration; NECK: supple; improvement of left neck adenopathy.  Currently 1 to 2 cm lymph node felt. LYMPH:  no palpable lymphadenopathy in the axillary or inguinal regions LUNGS: Decreased breath sounds auscultation bilaterally. No wheeze or crackles HEART/CVS: regular rate & rhythm and no murmurs; No lower extremity edema ABDOMEN:abdomen soft, non-tender and normal bowel sounds. No hepatomegaly or splenomegaly.  Musculoskeletal:no cyanosis of digits and no clubbing  PSYCH: alert & oriented x 3 with fluent speech NEURO: no focal motor/sensory deficits SKIN:  no rashes or significant lesions    LABORATORY DATA:  I have reviewed the data as listed    Component Value Date/Time   NA 134 (L) 01/23/2017 0920   NA 140 11/18/2013 1030   K 3.9 01/23/2017 0920   K 4.0 11/18/2013 1030   CL 95 (L) 01/23/2017 0920   CL 97 (L) 11/18/2013 1030   CO2 24 01/23/2017 0920   CO2 33 (H)  11/18/2013 1030   GLUCOSE 121 (H) 01/23/2017 0920   GLUCOSE 265 (H) 11/18/2013 1030   BUN 47 (H) 01/23/2017 0920   BUN 30 (H) 11/18/2013 1030   CREATININE 5.80 (H) 01/23/2017 0920   CREATININE 5.05 (H) 11/18/2013 1030   CALCIUM 7.9 (L) 01/23/2017 0920   CALCIUM 8.6 11/18/2013 1030   PROT 6.3 (L) 01/23/2017 0920   PROT 7.0 11/18/2013 1030   ALBUMIN 3.6 01/23/2017 0920   ALBUMIN 3.7 11/18/2013 1030   AST 18 01/23/2017 0920   AST 15 11/18/2013 1030   ALT 25 01/23/2017 0920   ALT 25 11/18/2013 1030   ALKPHOS 59 01/23/2017  0920   ALKPHOS 105 11/18/2013 1030   BILITOT 0.7 01/23/2017 0920   BILITOT 0.3 11/18/2013 1030   GFRNONAA 9 (L) 01/23/2017 0920   GFRNONAA 11 (L) 11/18/2013 1030   GFRAA 10 (L) 01/23/2017 0920   GFRAA 13 (L) 11/18/2013 1030    No results found for: SPEP, UPEP  Lab Results  Component Value Date   WBC 6.6 02/01/2017   NEUTROABS 5.1 02/01/2017   HGB 6.7 (L) 02/01/2017   HCT 20.2 (L) 02/01/2017   MCV 100.1 (H) 02/01/2017   PLT 131 (L) 02/01/2017      Chemistry      Component Value Date/Time   NA 134 (L) 01/23/2017 0920   NA 140 11/18/2013 1030   K 3.9 01/23/2017 0920   K 4.0 11/18/2013 1030   CL 95 (L) 01/23/2017 0920   CL 97 (L) 11/18/2013 1030   CO2 24 01/23/2017 0920   CO2 33 (H) 11/18/2013 1030   BUN 47 (H) 01/23/2017 0920   BUN 30 (H) 11/18/2013 1030   CREATININE 5.80 (H) 01/23/2017 0920   CREATININE 5.05 (H) 11/18/2013 1030      Component Value Date/Time   CALCIUM 7.9 (L) 01/23/2017 0920   CALCIUM 8.6 11/18/2013 1030   ALKPHOS 59 01/23/2017 0920   ALKPHOS 105 11/18/2013 1030   AST 18 01/23/2017 0920   AST 15 11/18/2013 1030   ALT 25 01/23/2017 0920   ALT 25 11/18/2013 1030   BILITOT 0.7 01/23/2017 0920   BILITOT 0.3 11/18/2013 1030       RADIOGRAPHIC STUDIES: I have personally reviewed the radiological images as listed and agreed with the findings in the report. No results found.   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  Clinical response noted with improvement of the neck adenopathy.  Patient tolerating well.  Hemoglobin 11 white count normal platelets 106.  #End-stage renal disease on hemodialysis; tolerating well.  Stable.  # Left eye keratitis-stable/improved.  #Age-appropriate screening: PSA blood draw [with Dr.Kolluru]; check/okay to hold off screening colonoscopy given multiple medical problems/patient reluctance.   # Follow up in 2 months/labs with HD.  Cc; Dr.Kolluru.    No orders of the defined types were placed in this encounter.  All questions were answered. The patient knows to call the clinic with any problems, questions or concerns.      Cammie Sickle, MD 08/28/2017 9:04 PM

## 2017-08-23 NOTE — Progress Notes (Signed)
No new changes noted today 

## 2017-09-13 ENCOUNTER — Telehealth: Payer: Self-pay | Admitting: *Deleted

## 2017-09-13 NOTE — Telephone Encounter (Signed)
Patient reports that he has a blister inside his jowl which is not painful nor does it cause problems eating. He states he is on chemotherapy. He reports he has had this current lesion for 2 days and that he has had others in the previous weeks. His dialysis nurse is concerned that his oral chemotherapy is causing these blisters. 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  Clinical response noted with improvement of the neck adenopathy.  Patient tolerating well.  Hemoglobin 11 white count normal platelets 106.  #End-stage renal disease on hemodialysis; tolerating well.  Stable.  # Left eye keratitis-stable/improved.  #Age-appropriate screening: PSA blood draw [with Dr.Kolluru]; check/okay to hold off screening colonoscopy given multiple medical problems/patient reluctance.   # Follow up in 2 months/labs with HD.  Cc; Dr.Kolluru.    No orders of the defined types were placed in this encounter.  All questions were answered. The patient knows to call the clinic with any problems, questions or concerns.      Cammie Sickle, MD 08/28/2017 9:04 PM         Assessment & Plan Note by Cammie Sickle, MD at 08/23/2017 11:19 AM

## 2017-09-13 NOTE — Telephone Encounter (Signed)
Per VO Dr Janese Banks, if area is entire area, he needs to be seen, if small, he can use Biotene. Per patient the blister is the size of a quarter and already gotten better than it was Sunday

## 2017-10-08 ENCOUNTER — Other Ambulatory Visit: Payer: Self-pay | Admitting: Pharmacist

## 2017-10-08 DIAGNOSIS — C9110 Chronic lymphocytic leukemia of B-cell type not having achieved remission: Secondary | ICD-10-CM

## 2017-10-08 MED ORDER — IBRUTINIB 140 MG PO CAPS: 280.0000 mg | ORAL_CAPSULE | ORAL | 3 refills | Status: DC

## 2017-10-09 ENCOUNTER — Ambulatory Visit: Payer: Medicare HMO | Admitting: Podiatry

## 2017-10-09 ENCOUNTER — Encounter: Payer: Self-pay | Admitting: Podiatry

## 2017-10-09 DIAGNOSIS — E0842 Diabetes mellitus due to underlying condition with diabetic polyneuropathy: Secondary | ICD-10-CM

## 2017-10-09 DIAGNOSIS — M79676 Pain in unspecified toe(s): Secondary | ICD-10-CM | POA: Diagnosis not present

## 2017-10-09 DIAGNOSIS — B351 Tinea unguium: Secondary | ICD-10-CM

## 2017-10-10 ENCOUNTER — Telehealth: Payer: Self-pay | Admitting: Pharmacy Technician

## 2017-10-10 NOTE — Telephone Encounter (Signed)
Oral Oncology Patient Advocate Encounter  Was successful in securing patient an $49 grant from Patient [facility]) to provide copayment coverage for Imbruvica.  This will keep the out of pocket expense at $0.    I have spoken with the patient.    The billing information is as follows and has been shared with [place].   Member ID: [id number] Group ID: [id number] RxBin: [id number] Dates of Eligibility: 08/08/17 through 08/08/18  [place] Patient [place] Phone [phone] Fax [phone]

## 2017-10-11 NOTE — Progress Notes (Signed)
   SUBJECTIVE Patient with a history of diabetes mellitus presents to office today complaining of elongated, thickened nails that cause pain while ambulating in shoes. He is unable to trim his own nails. Patient is here for further evaluation and treatment.   Past Medical History:  Diagnosis Date  . Anemia   . Arthritis   . Bladder cancer ([place])   . Chronic kidney disease   . Diabetes mellitus without complication ([place])   . Dialysis patient [facility])    M,W,F  . Dyspnea    DOE  . GERD (gastroesophageal reflux disease)   . HOH (hard of hearing)   . Hypertension   . IBS (irritable bowel syndrome)   . Lymphoma ([place]) 09/27/2014  . Neuropathy    RIGHT LEG  . Stroke [facility])    TIA    OBJECTIVE General Patient is awake, alert, and oriented x 3 and in no acute distress. Derm Skin is dry and supple bilateral. Negative open lesions or macerations. Remaining integument unremarkable. Nails are tender, long, thickened and dystrophic with subungual debris, consistent with onychomycosis, 1-5 bilateral. No signs of infection noted. Vasc  DP and PT pedal pulses palpable bilaterally. Temperature gradient within normal limits.  Neuro Epicritic and protective threshold sensation diminished bilaterally.  Musculoskeletal Exam No symptomatic pedal deformities noted bilateral. Muscular strength within normal limits.  ASSESSMENT 1. Diabetes Mellitus w/ peripheral neuropathy 2. Onychomycosis of nail due to dermatophyte bilateral 3. Pain in foot bilateral  PLAN OF CARE 1. Patient evaluated today. 2. Instructed to maintain good pedal hygiene and foot care. Stressed importance of controlling blood sugar.  3. Mechanical debridement of nails 1-5 bilaterally performed using a nail nipper. Filed with dremel without incident.  4. Return to clinic in 3 mos.     Edrick Kins, DPM [facility]  Dr. Edrick Kins, [address]                                        [place],  [place] [address]                Office [phone]  Fax [phone]

## 2017-10-25 ENCOUNTER — Telehealth: Payer: Self-pay | Admitting: Internal Medicine

## 2017-10-25 ENCOUNTER — Inpatient Hospital Stay: Payer: Medicare HMO | Attending: Internal Medicine | Admitting: Internal Medicine

## 2017-10-25 ENCOUNTER — Encounter: Payer: Self-pay | Admitting: Internal Medicine

## 2017-10-25 VITALS — BP 148/66 | HR 61 | Temp 96.6°F | Resp 16 | Wt 201.6 lb

## 2017-10-25 DIAGNOSIS — N186 End stage renal disease: Secondary | ICD-10-CM | POA: Diagnosis not present

## 2017-10-25 DIAGNOSIS — Z992 Dependence on renal dialysis: Secondary | ICD-10-CM | POA: Insufficient documentation

## 2017-10-25 DIAGNOSIS — Z8551 Personal history of malignant neoplasm of bladder: Secondary | ICD-10-CM | POA: Diagnosis not present

## 2017-10-25 DIAGNOSIS — D649 Anemia, unspecified: Secondary | ICD-10-CM

## 2017-10-25 DIAGNOSIS — C8303 Small cell B-cell lymphoma, intra-abdominal lymph nodes: Secondary | ICD-10-CM | POA: Diagnosis present

## 2017-10-25 DIAGNOSIS — I120 Hypertensive chronic kidney disease with stage 5 chronic kidney disease or end stage renal disease: Secondary | ICD-10-CM | POA: Diagnosis not present

## 2017-10-25 DIAGNOSIS — Z87891 Personal history of nicotine dependence: Secondary | ICD-10-CM | POA: Diagnosis not present

## 2017-10-25 DIAGNOSIS — E1122 Type 2 diabetes mellitus with diabetic chronic kidney disease: Secondary | ICD-10-CM | POA: Insufficient documentation

## 2017-10-25 NOTE — Telephone Encounter (Signed)
Ebony Hail please check with the patient's daughter-regarding questions about coverage/assistance with ibrutinib.  Thank you

## 2017-10-25 NOTE — Assessment & Plan Note (Addendum)
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  Stable.  #Continue ibrutinib at the current dose.  Most recent hemoglobin 10.5 [3 hemodialysis]; normal white count/platelets.  # Hb- 10-11; Iron sat- 17%; started on PO iron 1 week.  Continue the same.  Hold off Aranesp IV iron at this time.  # Agitation-new;  defer to PCP.   #End-stage renal disease on hemodialysis; tolerating well. STABLE.   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   # Follow up in 2 months/labs with HD.  Cc; Dr.Kolluru/ [facility].

## 2017-10-25 NOTE — Telephone Encounter (Signed)
I called and spoke with patients daughter, Mark [name]. The patient was afraid that he would be in the Medicare "donut hole" and wouldn't have the money to pay for his medication.  I explained to her that her father was approved for a grant that is covering his copay for [place]. She understands the grant will help him pay for the high copay.  [place] [facility] Phone [phone] Fax [phone] 10/25/2017 3:44 PM

## 2017-10-25 NOTE — Progress Notes (Signed)
Mark [name] OFFICE PROGRESS NOTE  Patient Care Team: Albina Billet, MD as PCP - General (Internal Medicine)  Cancer Staging No matching staging information was found for the patient.   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 Bone marrow biopsy on 03/05/13 with mildly hypercellular marrow for age 72-50% with interstitial predominantly small B-lymphocytic infiltrate estimated about 20-30% of marrow cells, mild nonspecific dyserythropoiesis, storage iron present this. Flow cytometry showed 29% CD5+ clonal B-cell population which is CD45+, CD5+, CD19+, CD20+, CD22+, CD23+, CD38+, HLA-DR+, Slg kappa+, and is CD10-, CD11b-, FMC7-. 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 PET scan on 03/03/13 with extensive hypermetabolic lymphadenopathy.  (presented with progressive Thrombocytopenia with persistent Anemia. CBC on 01/16/13 showed hemoglobin 10.0, MCV 98, platelets 80, WBC 4890 with 40% neutrophils, 54% lymphocytes, 1.4% monocytes. On Epogen and Venofer at dialysis treatments. Further workup showed small M-spike of 0.2).   Got Treanda/Rituxan x2 cycles in Dec 2014/Jan 2015. Then on single agent Rituxan q 8 weeks (got #3 dose on 09/16/13). --------------------------------------------------------------- ----------------------  # DEC 2014- SMALL CELL LYMPHOMA/CLL- STAGE IV [BMBx- 03/2013- 20-30% B cell infiltrate; CD 5+/CD 20+]; FISH- Trisomy 12;DEC 2014-Treanda-Rituxan x2 [held sec to prolonged hospital/pneumoani/HD]; Rituxa q8W [last June 2015; Held sec to Superficial Bladder ca]; CT- JUNE 2017- STABLE Mediastinal LN/supraclav LN/pelvic LN; CT DEC 2017- mild progression noted ~1.5-2cm mediastinal/Ax LN. Cont surveillance  # SEP 2018- PROGRESSION on PET scan-  # SEP 27th 2018- Ibrutinib 280 mg/day [after HD]; stopped Jan 2019- sec to severe anemia 6.7/easy bruising; Hb 9-10 off ibrutinib; II opinion at  [facility]/Dr.Coombs  # 81m RLL lung nodule- F/u in 6 M [Hx of smoking quit 2015]  # ESRD on HD [Dr.Voora/Dr.Kolluru]; DM; Non-healing ulcers in feet/Hx R foot drop- walker ambulation  #Noninvasive Bladder cancer/recent UTIs-currently resolved. 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 72y.o.  male pleasant patient above history of CLL/SLL currently on ibrutinib is here for follow-up.  Patient denies any new lumps or bumps.  Denies any easy bruising.  Denies any nausea vomiting.  No blood in stools or black or stools.   As per family patient has been more agitated; short tempered.   Review of Systems  Constitutional: Negative for chills, diaphoresis, fever, malaise/fatigue and weight loss.  HENT: Negative for nosebleeds and sore throat.   Eyes: Negative for double vision.  Respiratory: Negative for cough, hemoptysis, sputum production, shortness of breath and wheezing.   Cardiovascular: Negative for chest pain, palpitations, orthopnea and leg swelling.  Gastrointestinal: Negative for abdominal pain, blood in stool, constipation, diarrhea, heartburn, melena, nausea and vomiting.  Genitourinary: Negative for dysuria, frequency and urgency.  Musculoskeletal: Negative for back pain and joint pain.  Skin: Negative.  Negative for itching and rash.  Neurological: Negative for dizziness, tingling, focal weakness, weakness and headaches.  Endo/Heme/Allergies: Does not bruise/bleed easily.  Psychiatric/Behavioral: Negative for depression. The patient does not have insomnia.        Agitation      PAST MEDICAL HISTORY :  Past Medical History:  Diagnosis Date  . Anemia   . Arthritis   . Bladder cancer ([place]   .  Chronic kidney disease   .  Diabetes mellitus without complication ([place])   . Dialysis patient [facility])    M,W,F  . Dyspnea    DOE  . GERD (gastroesophageal reflux disease)   . HOH (hard of hearing)   . Hypertension   . IBS (irritable bowel syndrome)   . Lymphoma ([place]) 09/27/2014  . Neuropathy    RIGHT LEG  . Stroke [facility])    TIA    PAST SURGICAL HISTORY :   Past Surgical History:  Procedure Laterality Date  . BACK SURGERY  2007  . CATARACT EXTRACTION W/PHACO Left 03/23/2016   Procedure: CATARACT EXTRACTION PHACO AND INTRAOCULAR LENS PLACEMENT (IOC);  Surgeon: Leandrew Koyanagi, MD;  Location: [facility] ORS;  Service: Ophthalmology;  Laterality: Left;  [place] 52.6 AP% 14.7 CDE 7.72 Fluid pack lot # [id number]  . CATARACT EXTRACTION W/PHACO Right 05/30/2016   Procedure: CATARACT EXTRACTION PHACO AND INTRAOCULAR LENS PLACEMENT ([place]);  Surgeon: Leandrew Koyanagi, MD;  Location: [facility] ORS;  Service: Ophthalmology;  Laterality: Right;  [place] 01:08 AP% 13.9 CDE 9.53  note: could not get IV in patient, so procedure done without anesthesia personell present, ok per Dr Wallace Going fluid pack lo t# [id number]  . CIRCUMCISION    . PERIPHERAL VASCULAR CATHETERIZATION Left 08/19/2015   Procedure: A/V Shuntogram/Fistulagram;  Surgeon: Algernon Huxley, MD;  Location: [place] [facility];  Service: Cardiovascular;  Laterality: Left;  . PERIPHERAL VASCULAR CATHETERIZATION N/A 08/19/2015   Procedure: A/V Shunt Intervention;  Surgeon: Algernon Huxley, MD;  Location: [place] [facility];  Service: Cardiovascular;  Laterality: N/A;    FAMILY HISTORY :   Family History  Problem Relation Age of Onset  . Cancer Mother   . Kidney cancer Neg Hx   . Prostate cancer Neg Hx   . Kidney failure Neg Hx   . Bladder Cancer Neg Hx     SOCIAL HISTORY:   Social History   Tobacco Use  . Smoking status: Former Smoker    Types: Cigarettes    Last attempt to quit: 05/19/2013    Years since quitting: 4.4  . Smokeless tobacco:  Never Used  . Tobacco comment: quit  Substance Use Topics  . Alcohol use: No    Alcohol/week: 0.0 oz  . Drug use: No    ALLERGIES:  is allergic to ibuprofen; multivitamin [centrum]; daypro [oxaprozin]; and tape.  MEDICATIONS:  Current Outpatient Medications  Medication Sig Dispense Refill  . amLODipine (NORVASC) 10 MG tablet Take 10 mg by mouth daily.     [name] aspirin 81 MG EC tablet Take 81 mg by mouth daily.     . calcium acetate (PHOSLO) 667 MG capsule Take 1,334-2,001 mg by mouth See admin instructions. 3 caps with meals and 2 caps with snacks    . Difluprednate (DUREZOL) 0.05 % EMUL Place 1 drop into the left eye 4 (four) times daily.    [name] ENSURE PLUS (ENSURE PLUS) LIQD Take by mouth 3 (three) times a week. M,W.F    . ferric citrate (AURYXIA) 1 GM 210 MG(Fe) tablet Take 420 mg by mouth 3 (three) times daily with meals.    . fluticasone (FLONASE) 50 MCG/ACT nasal spray Place 1 spray into both nostrils daily as needed for allergies.     . furosemide (LASIX) 80 MG tablet Take 80-160 mg by mouth See admin instructions. 2 tablets in am- on sat, Sunday, Tuesday and Thursday 1 tablet in PM- on Sundays -Saturdays    . gabapentin (NEURONTIN) 100 MG capsule Take 1 capsule (100 mg total)  by mouth 3 (three) times daily. (Patient taking differently: Take 200-300 mg by mouth 2 (two) times daily. ) 90 capsule 0  . glimepiride (AMARYL) 4 MG tablet Take 8 mg by mouth at bedtime. Prn glucose levels    . ibrutinib (IMBRUVICA) 140 MG capsul Take 2 capsules (280 mg total) by mouth 3 (three) times a week. Take following dialysis. 24 capsule 3  . LEVEMIR FLEXTOUCH 100 UNIT/ML Pen Inject 25-30 Units into the skin daily at 10 pm. Depending on what he eats    . lovastatin (MEVACOR) 20 MG tablet Take 1 tablet by mouth daily.    . metoCLOPramide (REGLAN) 5 MG tablet Take 5 mg by mouth 3 (three) times daily before meals. Patient takes 1 tab with each meal.    . metoprolol tartrate (LOPRESSOR) 100 MG tablet Take 25  mg by mouth See admin instructions. Pt takes Tues morning, Friday Night, and Saturday morning and evening    . multivitamin (RENA-VIT) TABS tablet Take 1 tablet by mouth at bedtime.     [name] nystatin cream (MYCOSTATIN) Apply 1 application topically 2 (two) times daily.    . silver sulfADIAZINE (SILVADENE) 1 % cream APPLY 1 APPLICATION TOPICALY DAILY 50 g 1   No current facility-administered medications for this visit.     PHYSICAL EXAMINATION: ECOG PERFORMANCE STATUS: 1 - Symptomatic but completely ambulatory  BP (!) 148/66 (BP Location: Left Arm, Patient Position: Sitting)   Pulse 61   Temp (!) 96.6 F (35.9 C) (Tympanic)   Resp 16   Wt 201 lb 9.6 oz (91.4 kg)   BMI 28.93 kg/m   Filed Weights   10/25/17 1017 10/25/17 1021  Weight: 201 lb 9.6 oz (91.4 kg) 201 lb 9.6 oz (91.4 kg)    GENERAL: Well-nourished well-developed; Alert, no distress and comfortable. accompanied by family.  EYES: no pallor or icterus OROPHARYNX: no thrush or ulceration; NECK: supple; no lymph nodes felt. LYMPH:  no palpable lymphadenopathy in the axillary or inguinal regions LUNGS: Decreased breath sounds auscultation bilaterally. No wheeze or crackles HEART/CVS: regular rate & rhythm and no murmurs; No lower extremity edema ABDOMEN:abdomen soft, non-tender and normal bowel sounds. No hepatomegaly or splenomegaly.  Musculoskeletal:no cyanosis of digits and no clubbing  PSYCH: alert & oriented x 3 with fluent speech NEURO: no focal motor/sensory deficits SKIN:  no rashes or significant lesions    LABORATORY DATA:  I have reviewed the data as listed    Component Value Date/Time   NA 134 (L) 01/23/2017 0920   NA 140 11/18/2013 1030   K 3.9 01/23/2017 0920   K 4.0 11/18/2013 1030   CL 95 (L) 01/23/2017 0920   CL 97 (L) 11/18/2013 1030   CO2 24 01/23/2017 0920   CO2 33 (H) 11/18/2013 1030   GLUCOSE 121 (H) 01/23/2017 0920   GLUCOSE 265 (H) 11/18/2013 1030   BUN 47 (H) 01/23/2017 0920   BUN 30 (H)  11/18/2013 1030   CREATININE 5.80 (H) 01/23/2017 0920   CREATININE 5.05 (H) 11/18/2013 1030   CALCIUM 7.9 (L) 01/23/2017 0920   CALCIUM 8.6 11/18/2013 1030   PROT 6.3 (L) 01/23/2017 0920   PROT 7.0 11/18/2013 1030   ALBUMIN 3.6 01/23/2017 0920   ALBUMIN 3.7 11/18/2013 1030   AST 18 01/23/2017 0920   AST 15 11/18/2013 1030   ALT 25 01/23/2017 0920   ALT 25 11/18/2013 1030   ALKPHOS 59 01/23/2017 0920   ALKPHOS 105 11/18/2013 1030   BILITOT 0.7 01/23/2017 0920  BILITOT 0.3 11/18/2013 1030   GFRNONAA 9 (L) 01/23/2017 0920   GFRNONAA 11 (L) 11/18/2013 1030   GFRAA 10 (L) 01/23/2017 0920   GFRAA 13 (L) 11/18/2013 1030    No results found for: SPEP, UPEP  Lab Results  Component Value Date   WBC 6.6 02/01/2017   NEUTROABS 5.1 02/01/2017   HGB 6.7 (L) 02/01/2017   HCT 20.2 (L) 02/01/2017   MCV 100.1 (H) 02/01/2017   PLT 131 (L) 02/01/2017      Chemistry      Component Value Date/Time   NA 134 (L) 01/23/2017 0920   NA 140 11/18/2013 1030   K 3.9 01/23/2017 0920   K 4.0 11/18/2013 1030   CL 95 (L) 01/23/2017 0920   CL 97 (L) 11/18/2013 1030   CO2 24 01/23/2017 0920   CO2 33 (H) 11/18/2013 1030   BUN 47 (H) 01/23/2017 0920   BUN 30 (H) 11/18/2013 1030   CREATININE 5.80 (H) 01/23/2017 0920   CREATININE 5.05 (H) 11/18/2013 1030      Component Value Date/Time   CALCIUM 7.9 (L) 01/23/2017 0920   CALCIUM 8.6 11/18/2013 1030   ALKPHOS 59 01/23/2017 0920   ALKPHOS 105 11/18/2013 1030   AST 18 01/23/2017 0920   AST 15 11/18/2013 1030   ALT 25 01/23/2017 0920   ALT 25 11/18/2013 1030   BILITOT 0.7 01/23/2017 0920   BILITOT 0.3 11/18/2013 1030       RADIOGRAPHIC STUDIES: I have personally reviewed the radiological images as listed and agreed with the findings in the report. No results found.   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   Stable.  #Continue ibrutinib at the current dose.  Most recent hemoglobin 10.5 [3 hemodialysis]; normal white count/platelets.  # Hb- 10-11; Iron sat- 17%; started on PO iron 1 week.  Continue the same.  Hold off Aranesp IV iron at this time.  # Agitation-new;  defer to PCP.   #End-stage renal disease on hemodialysis; tolerating well. STABLE.   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   # Follow up in 2 months/labs with HD.  Cc; Dr.Kolluru/ [facility] Busing.    No orders of the defined types were placed in this encounter.  All questions were answered. The patient knows to call the clinic with any problems, questions or concerns.      Cammie Sickle, MD 10/25/2017 2:09 PM

## 2017-10-26 NOTE — Telephone Encounter (Signed)
Thank you :)

## 2017-10-31 ENCOUNTER — Ambulatory Visit: Payer: Medicare HMO | Admitting: Orthotics

## 2017-10-31 DIAGNOSIS — L851 Acquired keratosis [keratoderma] palmaris et plantaris: Secondary | ICD-10-CM

## 2017-10-31 DIAGNOSIS — L84 Corns and callosities: Secondary | ICD-10-CM

## 2017-10-31 DIAGNOSIS — E0842 Diabetes mellitus due to underlying condition with diabetic polyneuropathy: Secondary | ICD-10-CM

## 2017-10-31 NOTE — Progress Notes (Signed)
Patient seen and measured for DBS per Dr. Amalia Hailey.Marland KitchenMarland KitchenPaiint wearing AFO brace RT and measures 11xw on [place].  Chose ortho 520

## 2017-11-12 ENCOUNTER — Telehealth: Payer: Self-pay | Admitting: Internal Medicine

## 2017-11-12 ENCOUNTER — Telehealth: Payer: Self-pay | Admitting: Pharmacist

## 2017-11-12 NOTE — Telephone Encounter (Signed)
Family called pt doing poorly- night sweats/ increasing O2- informed Dr. Juleen China re: drawing labs in HD.

## 2017-11-12 NOTE — Telephone Encounter (Signed)
Oral Chemotherapy Pharmacist Encounter   Received a call from [facility] [name]'s daughter asking if I thought some of the recent issues her father was experiencing were due to his switching from tablets to capsule. I told Mark [name] that his total dose was the same so her father should not be experiencing any issues related to this formulation change.  She reported that for the since last week her father has been experiencing night sweats, an increase in oxygen usage at night, and fatigue. Spoke with Dr. Rogue Bussing and he would like to see that patient's labs collected at his dialysis appt today.  Called [facility] (tele: [phone]) spoke with Mark [name] and asked that they fax over the results from today's lab work. She told me it would take a couple days for the results to come back but they would be faxed over when they returned. Provided them with office fax number (fax: [phone]).   Mark [name], PharmD, BCPS, [facility] Hematology/Oncology Clinical Pharmacist [facility] [phone]  11/12/2017 3:02 PM

## 2017-11-19 ENCOUNTER — Telehealth: Payer: Self-pay | Admitting: Internal Medicine

## 2017-11-19 NOTE — Telephone Encounter (Signed)
FYI- Spoke to pt's daughter, [name]- Hb-9.2; LDH- 197; wbc/platelets- Normal; pt on antibiotics as per nephology. Bcx- pending. Okay to HOLD ibrutinib while feeling poorly/being treated for infection.

## 2017-12-10 MED ORDER — DEXTROSE 10 % IV SOLN
25.00 | INTRAVENOUS | Status: DC
Start: ? — End: 2017-12-10

## 2017-12-10 MED ORDER — METOCLOPRAMIDE HCL 10 MG PO TABS
5.00 | ORAL_TABLET | ORAL | Status: DC
Start: 2017-12-11 — End: 2017-12-10

## 2017-12-10 MED ORDER — EPOETIN ALFA-EPBX 4000 UNIT/ML IJ SOLN
8000.00 | INTRAMUSCULAR | Status: DC
Start: 2017-12-10 — End: 2017-12-10

## 2017-12-10 MED ORDER — LIDOCAINE HCL 2 % IJ SOLN
10.00 | INTRAMUSCULAR | Status: DC
Start: 2017-12-10 — End: 2017-12-10

## 2017-12-10 MED ORDER — INSULIN GLARGINE 100 UNIT/ML ~~LOC~~ SOLN
9.00 | SUBCUTANEOUS | Status: DC
Start: 2017-12-13 — End: 2017-12-10

## 2017-12-10 MED ORDER — LOPERAMIDE HCL 2 MG PO CAPS
4.00 | ORAL_CAPSULE | ORAL | Status: DC
Start: ? — End: 2017-12-10

## 2017-12-10 MED ORDER — INSULIN LISPRO 100 UNIT/ML ~~LOC~~ SOLN
3.00 | SUBCUTANEOUS | Status: DC
Start: 2017-12-13 — End: 2017-12-10

## 2017-12-10 MED ORDER — DIFLUPREDNATE 0.05 % OP EMUL
1.00 | OPHTHALMIC | Status: DC
Start: 2017-12-13 — End: 2017-12-10

## 2017-12-10 MED ORDER — CARBOXYMETHYLCELLULOSE SODIUM 0.25 % OP SOLN
1.00 | OPHTHALMIC | Status: DC
Start: ? — End: 2017-12-10

## 2017-12-10 MED ORDER — B COMPLEX PO TABS
1.00 | ORAL_TABLET | ORAL | Status: DC
Start: 2017-12-14 — End: 2017-12-10

## 2017-12-10 MED ORDER — HEPARIN SODIUM (PORCINE) 5000 UNIT/ML IJ SOLN
5000.00 | INTRAMUSCULAR | Status: DC
Start: 2017-12-13 — End: 2017-12-10

## 2017-12-10 MED ORDER — POLYETHYLENE GLYCOL 3350 17 G PO PACK
17.00 | PACK | ORAL | Status: DC
Start: 2017-12-14 — End: 2017-12-10

## 2017-12-10 MED ORDER — LORAZEPAM 2 MG/ML IJ SOLN
1.00 | INTRAMUSCULAR | Status: DC
Start: 2017-12-10 — End: 2017-12-10

## 2017-12-10 MED ORDER — SALINE NASAL SPRAY 0.65 % NA SOLN
2.00 | NASAL | Status: DC
Start: ? — End: 2017-12-10

## 2017-12-10 MED ORDER — SODIUM CHLORIDE 0.9 % IV SOLN
INTRAVENOUS | Status: DC
Start: ? — End: 2017-12-10

## 2017-12-10 MED ORDER — ALBUTEROL SULFATE (2.5 MG/3ML) 0.083% IN NEBU
2.50 | INHALATION_SOLUTION | RESPIRATORY_TRACT | Status: DC
Start: ? — End: 2017-12-10

## 2017-12-10 MED ORDER — ASPIRIN 81 MG PO CHEW
81.00 | CHEWABLE_TABLET | ORAL | Status: DC
Start: 2017-12-14 — End: 2017-12-10

## 2017-12-10 MED ORDER — PRAVASTATIN SODIUM 20 MG PO TABS
20.00 | ORAL_TABLET | ORAL | Status: DC
Start: 2017-12-13 — End: 2017-12-10

## 2017-12-10 MED ORDER — LOPERAMIDE HCL 2 MG PO CAPS
2.00 | ORAL_CAPSULE | ORAL | Status: DC
Start: ? — End: 2017-12-10

## 2017-12-10 MED ORDER — AMLODIPINE BESYLATE 10 MG PO TABS
10.00 | ORAL_TABLET | ORAL | Status: DC
Start: 2017-12-14 — End: 2017-12-10

## 2017-12-10 MED ORDER — CALCIUM ACETATE (PHOS BINDER) 667 MG PO CAPS
667.00 | ORAL_CAPSULE | ORAL | Status: DC
Start: 2017-12-13 — End: 2017-12-10

## 2017-12-10 MED ORDER — ACETAMINOPHEN 325 MG PO TABS
650.00 | ORAL_TABLET | ORAL | Status: DC
Start: ? — End: 2017-12-10

## 2017-12-10 MED ORDER — INSULIN LISPRO 100 UNIT/ML ~~LOC~~ SOLN
1.00 | SUBCUTANEOUS | Status: DC
Start: 2017-12-13 — End: 2017-12-10

## 2017-12-11 MED ORDER — GENERIC EXTERNAL MEDICATION
1.00 | Status: DC
Start: 2017-12-11 — End: 2017-12-11

## 2017-12-12 MED ORDER — METOCLOPRAMIDE HCL 10 MG PO TABS
5.00 | ORAL_TABLET | ORAL | Status: DC
Start: 2017-12-13 — End: 2017-12-12

## 2017-12-13 ENCOUNTER — Telehealth: Payer: Self-pay | Admitting: *Deleted

## 2017-12-13 NOTE — Telephone Encounter (Signed)
Apt has been scheduled.

## 2017-12-13 NOTE — Telephone Encounter (Signed)
Opened in error

## 2017-12-13 NOTE — Telephone Encounter (Signed)
Spoke to [facility] Dr. Collene Mares; patient noted to have breast mass on PET scan; done at [facility].  Recommend follow-up with [place] on 9/20; 8:45.   Please schedule.  No labs.

## 2017-12-15 ENCOUNTER — Inpatient Hospital Stay: Payer: Medicare HMO

## 2017-12-15 ENCOUNTER — Inpatient Hospital Stay
Admission: EM | Admit: 2017-12-15 | Discharge: 2017-12-23 | DRG: 871 | Disposition: A | Discharge status: Home Health | Payer: Medicare HMO | Attending: Internal Medicine | Admitting: Internal Medicine

## 2017-12-15 ENCOUNTER — Other Ambulatory Visit: Payer: Self-pay

## 2017-12-15 ENCOUNTER — Encounter: Payer: Self-pay | Admitting: Emergency Medicine

## 2017-12-15 DIAGNOSIS — E785 Hyperlipidemia, unspecified: Secondary | ICD-10-CM | POA: Diagnosis present

## 2017-12-15 DIAGNOSIS — N63 Unspecified lump in unspecified breast: Secondary | ICD-10-CM | POA: Diagnosis not present

## 2017-12-15 DIAGNOSIS — I132 Hypertensive heart and chronic kidney disease with heart failure and with stage 5 chronic kidney disease, or end stage renal disease: Secondary | ICD-10-CM | POA: Diagnosis present

## 2017-12-15 DIAGNOSIS — Z8051 Family history of malignant neoplasm of kidney: Secondary | ICD-10-CM | POA: Diagnosis not present

## 2017-12-15 DIAGNOSIS — J9620 Acute and chronic respiratory failure, unspecified whether with hypoxia or hypercapnia: Secondary | ICD-10-CM | POA: Diagnosis not present

## 2017-12-15 DIAGNOSIS — M1990 Unspecified osteoarthritis, unspecified site: Secondary | ICD-10-CM | POA: Diagnosis present

## 2017-12-15 DIAGNOSIS — Z87440 Personal history of urinary (tract) infections: Secondary | ICD-10-CM

## 2017-12-15 DIAGNOSIS — Z515 Encounter for palliative care: Secondary | ICD-10-CM | POA: Diagnosis not present

## 2017-12-15 DIAGNOSIS — D631 Anemia in chronic kidney disease: Secondary | ICD-10-CM | POA: Diagnosis present

## 2017-12-15 DIAGNOSIS — M21379 Foot drop, unspecified foot: Secondary | ICD-10-CM | POA: Diagnosis present

## 2017-12-15 DIAGNOSIS — Z888 Allergy status to other drugs, medicaments and biological substances status: Secondary | ICD-10-CM

## 2017-12-15 DIAGNOSIS — R05 Cough: Secondary | ICD-10-CM

## 2017-12-15 DIAGNOSIS — Z9981 Dependence on supplemental oxygen: Secondary | ICD-10-CM | POA: Diagnosis not present

## 2017-12-15 DIAGNOSIS — K589 Irritable bowel syndrome without diarrhea: Secondary | ICD-10-CM | POA: Diagnosis present

## 2017-12-15 DIAGNOSIS — E119 Type 2 diabetes mellitus without complications: Secondary | ICD-10-CM | POA: Diagnosis not present

## 2017-12-15 DIAGNOSIS — Z8052 Family history of malignant neoplasm of bladder: Secondary | ICD-10-CM | POA: Diagnosis not present

## 2017-12-15 DIAGNOSIS — Z79899 Other long term (current) drug therapy: Secondary | ICD-10-CM

## 2017-12-15 DIAGNOSIS — D649 Anemia, unspecified: Secondary | ICD-10-CM

## 2017-12-15 DIAGNOSIS — N6310 Unspecified lump in the right breast, unspecified quadrant: Secondary | ICD-10-CM | POA: Diagnosis present

## 2017-12-15 DIAGNOSIS — Z7951 Long term (current) use of inhaled steroids: Secondary | ICD-10-CM

## 2017-12-15 DIAGNOSIS — Z9841 Cataract extraction status, right eye: Secondary | ICD-10-CM

## 2017-12-15 DIAGNOSIS — E1122 Type 2 diabetes mellitus with diabetic chronic kidney disease: Secondary | ICD-10-CM | POA: Diagnosis present

## 2017-12-15 DIAGNOSIS — C9110 Chronic lymphocytic leukemia of B-cell type not having achieved remission: Secondary | ICD-10-CM | POA: Diagnosis not present

## 2017-12-15 DIAGNOSIS — E1140 Type 2 diabetes mellitus with diabetic neuropathy, unspecified: Secondary | ICD-10-CM | POA: Diagnosis present

## 2017-12-15 DIAGNOSIS — Z961 Presence of intraocular lens: Secondary | ICD-10-CM | POA: Diagnosis present

## 2017-12-15 DIAGNOSIS — Z87891 Personal history of nicotine dependence: Secondary | ICD-10-CM

## 2017-12-15 DIAGNOSIS — Z9842 Cataract extraction status, left eye: Secondary | ICD-10-CM

## 2017-12-15 DIAGNOSIS — E1165 Type 2 diabetes mellitus with hyperglycemia: Secondary | ICD-10-CM | POA: Diagnosis present

## 2017-12-15 DIAGNOSIS — J9610 Chronic respiratory failure, unspecified whether with hypoxia or hypercapnia: Secondary | ICD-10-CM | POA: Diagnosis not present

## 2017-12-15 DIAGNOSIS — N186 End stage renal disease: Secondary | ICD-10-CM | POA: Diagnosis present

## 2017-12-15 DIAGNOSIS — R040 Epistaxis: Secondary | ICD-10-CM | POA: Diagnosis not present

## 2017-12-15 DIAGNOSIS — J9621 Acute and chronic respiratory failure with hypoxia: Secondary | ICD-10-CM | POA: Diagnosis not present

## 2017-12-15 DIAGNOSIS — Z66 Do not resuscitate: Secondary | ICD-10-CM | POA: Diagnosis present

## 2017-12-15 DIAGNOSIS — E875 Hyperkalemia: Secondary | ICD-10-CM | POA: Diagnosis present

## 2017-12-15 DIAGNOSIS — A419 Sepsis, unspecified organism: Secondary | ICD-10-CM | POA: Diagnosis present

## 2017-12-15 DIAGNOSIS — L89622 Pressure ulcer of left heel, stage 2: Secondary | ICD-10-CM | POA: Diagnosis present

## 2017-12-15 DIAGNOSIS — N2581 Secondary hyperparathyroidism of renal origin: Secondary | ICD-10-CM | POA: Diagnosis present

## 2017-12-15 DIAGNOSIS — Z91048 Other nonmedicinal substance allergy status: Secondary | ICD-10-CM

## 2017-12-15 DIAGNOSIS — Z8673 Personal history of transient ischemic attack (TIA), and cerebral infarction without residual deficits: Secondary | ICD-10-CM

## 2017-12-15 DIAGNOSIS — Z992 Dependence on renal dialysis: Secondary | ICD-10-CM | POA: Diagnosis not present

## 2017-12-15 DIAGNOSIS — H44002 Unspecified purulent endophthalmitis, left eye: Secondary | ICD-10-CM | POA: Diagnosis present

## 2017-12-15 DIAGNOSIS — Z8042 Family history of malignant neoplasm of prostate: Secondary | ICD-10-CM | POA: Diagnosis not present

## 2017-12-15 DIAGNOSIS — Z7982 Long term (current) use of aspirin: Secondary | ICD-10-CM

## 2017-12-15 DIAGNOSIS — Z7189 Other specified counseling: Secondary | ICD-10-CM | POA: Diagnosis not present

## 2017-12-15 DIAGNOSIS — L8990 Pressure ulcer of unspecified site, unspecified stage: Secondary | ICD-10-CM

## 2017-12-15 DIAGNOSIS — I5033 Acute on chronic diastolic (congestive) heart failure: Secondary | ICD-10-CM | POA: Diagnosis present

## 2017-12-15 DIAGNOSIS — Z8551 Personal history of malignant neoplasm of bladder: Secondary | ICD-10-CM

## 2017-12-15 DIAGNOSIS — K219 Gastro-esophageal reflux disease without esophagitis: Secondary | ICD-10-CM | POA: Diagnosis present

## 2017-12-15 DIAGNOSIS — L89892 Pressure ulcer of other site, stage 2: Secondary | ICD-10-CM | POA: Diagnosis present

## 2017-12-15 DIAGNOSIS — I5020 Unspecified systolic (congestive) heart failure: Secondary | ICD-10-CM | POA: Diagnosis not present

## 2017-12-15 DIAGNOSIS — R4182 Altered mental status, unspecified: Secondary | ICD-10-CM

## 2017-12-15 DIAGNOSIS — R0602 Shortness of breath: Secondary | ICD-10-CM

## 2017-12-15 DIAGNOSIS — R059 Cough, unspecified: Secondary | ICD-10-CM

## 2017-12-15 DIAGNOSIS — N390 Urinary tract infection, site not specified: Secondary | ICD-10-CM

## 2017-12-15 DIAGNOSIS — H9190 Unspecified hearing loss, unspecified ear: Secondary | ICD-10-CM | POA: Diagnosis present

## 2017-12-15 DIAGNOSIS — C9100 Acute lymphoblastic leukemia not having achieved remission: Secondary | ICD-10-CM | POA: Diagnosis present

## 2017-12-15 LAB — CBC WITH DIFFERENTIAL/PLATELET
BASOS ABS: 0 10*3/uL (ref 0–0.1)
Basophils Relative: 1 %
EOS ABS: 0.1 10*3/uL (ref 0–0.7)
EOS PCT: 2 %
HCT: 20.4 % — ABNORMAL LOW (ref 40.0–52.0)
Hemoglobin: 6.6 g/dL — ABNORMAL LOW (ref 13.0–18.0)
LYMPHS PCT: 29 %
Lymphs Abs: 1.5 10*3/uL (ref 1.0–3.6)
MCH: 32.3 pg (ref 26.0–34.0)
MCHC: 32.5 g/dL (ref 32.0–36.0)
MCV: 99.2 fL (ref 80.0–100.0)
MONO ABS: 0.1 10*3/uL — AB (ref 0.2–1.0)
Monocytes Relative: 2 %
Neutro Abs: 3.5 10*3/uL (ref 1.4–6.5)
Neutrophils Relative %: 66 %
PLATELETS: 117 10*3/uL — AB (ref 150–440)
RBC: 2.05 MIL/uL — ABNORMAL LOW (ref 4.40–5.90)
RDW: 19.9 % — AB (ref 11.5–14.5)
WBC: 5.2 10*3/uL (ref 3.8–10.6)

## 2017-12-15 LAB — URINALYSIS, COMPLETE (UACMP) WITH MICROSCOPIC
Bilirubin Urine: NEGATIVE
Glucose, UA: NEGATIVE mg/dL
Ketones, ur: NEGATIVE mg/dL
Nitrite: NEGATIVE
Protein, ur: 100 mg/dL — AB
SPECIFIC GRAVITY, URINE: 1.019 (ref 1.005–1.030)
Squamous Epithelial / LPF: NONE SEEN (ref 0–5)
pH: 5 (ref 5.0–8.0)

## 2017-12-15 LAB — GLUCOSE, CAPILLARY: Glucose-Capillary: 130 mg/dL — ABNORMAL HIGH (ref 70–99)

## 2017-12-15 LAB — COMPREHENSIVE METABOLIC PANEL
ALT: 26 U/L (ref 0–44)
ANION GAP: 11 (ref 5–15)
AST: 25 U/L (ref 15–41)
Albumin: 3.6 g/dL (ref 3.5–5.0)
Alkaline Phosphatase: 72 U/L (ref 38–126)
BUN: 39 mg/dL — AB (ref 8–23)
CALCIUM: 7.7 mg/dL — AB (ref 8.9–10.3)
CHLORIDE: 101 mmol/L (ref 98–111)
CO2: 27 mmol/L (ref 22–32)
CREATININE: 6.06 mg/dL — AB (ref 0.61–1.24)
GFR calc non Af Amer: 8 mL/min — ABNORMAL LOW (ref >60)
GFR, EST AFRICAN AMERICAN: 10 mL/min — AB (ref >60)
Glucose, Bld: 130 mg/dL — ABNORMAL HIGH (ref 70–99)
POTASSIUM: 5.3 mmol/L — AB (ref 3.5–5.1)
SODIUM: 139 mmol/L (ref 135–145)
TOTAL PROTEIN: 6.2 g/dL — AB (ref 6.5–8.1)
Total Bilirubin: 1 mg/dL (ref 0.3–1.2)

## 2017-12-15 LAB — PROTIME-INR
INR: 1.09
PROTHROMBIN TIME: 14 s (ref 11.4–15.2)

## 2017-12-15 LAB — LACTIC ACID, PLASMA: LACTIC ACID, VENOUS: 1.8 mmol/L (ref 0.5–1.9)

## 2017-12-15 MED ORDER — INSULIN ASPART 100 UNIT/ML ~~LOC~~ SOLN
0.0000 [IU] | Freq: Every day | SUBCUTANEOUS | Status: DC
  Administered 2017-12-17: 3 [IU] via SUBCUTANEOUS
  Administered 2017-12-19 – 2017-12-22 (×3): 5 [IU] via SUBCUTANEOUS
  Filled 2017-12-15 (×5): qty 1

## 2017-12-15 MED ORDER — SODIUM CHLORIDE 0.9 % IV SOLN
1.0000 g | Freq: Once | INTRAVENOUS | Status: AC
  Administered 2017-12-15: 1 g via INTRAVENOUS
  Filled 2017-12-15: qty 10

## 2017-12-15 MED ORDER — VANCOMYCIN HCL IN DEXTROSE 1-5 GM/200ML-% IV SOLN
1000.0000 mg | Freq: Once | INTRAVENOUS | Status: AC
  Administered 2017-12-15: 1000 mg via INTRAVENOUS
  Filled 2017-12-15: qty 200

## 2017-12-15 MED ORDER — RENA-VITE PO TABS
1.0000 | ORAL_TABLET | Freq: Every day | ORAL | Status: DC
  Administered 2017-12-15 – 2017-12-22 (×8): 1 via ORAL
  Filled 2017-12-15 (×8): qty 1

## 2017-12-15 MED ORDER — SODIUM ZIRCONIUM CYCLOSILICATE 5 G PO PACK
10.0000 g | PACK | Freq: Every day | ORAL | Status: DC
  Administered 2017-12-16 (×2): 10 g via ORAL
  Filled 2017-12-15 (×4): qty 2

## 2017-12-15 MED ORDER — NYSTATIN 100000 UNIT/GM EX CREA
1.0000 "application " | TOPICAL_CREAM | Freq: Two times a day (BID) | CUTANEOUS | Status: DC | PRN
  Filled 2017-12-15: qty 15

## 2017-12-15 MED ORDER — POLYVINYL ALCOHOL-POVIDONE PF 1.4-0.6 % OP SOLN
1.0000 [drp] | Freq: Three times a day (TID) | OPHTHALMIC | Status: DC | PRN
  Filled 2017-12-15: qty 1

## 2017-12-15 MED ORDER — PRAVASTATIN SODIUM 20 MG PO TABS
20.0000 mg | ORAL_TABLET | Freq: Every day | ORAL | Status: DC
  Administered 2017-12-16 – 2017-12-22 (×7): 20 mg via ORAL
  Filled 2017-12-15 (×7): qty 1

## 2017-12-15 MED ORDER — METOCLOPRAMIDE HCL 5 MG PO TABS
5.0000 mg | ORAL_TABLET | Freq: Three times a day (TID) | ORAL | Status: DC
  Administered 2017-12-16 – 2017-12-23 (×18): 5 mg via ORAL
  Filled 2017-12-15 (×19): qty 1

## 2017-12-15 MED ORDER — GABAPENTIN 100 MG PO CAPS
100.0000 mg | ORAL_CAPSULE | Freq: Two times a day (BID) | ORAL | Status: DC
  Administered 2017-12-15 – 2017-12-23 (×15): 100 mg via ORAL
  Filled 2017-12-15 (×16): qty 1

## 2017-12-15 MED ORDER — DOCUSATE SODIUM 100 MG PO CAPS
100.0000 mg | ORAL_CAPSULE | Freq: Every day | ORAL | Status: DC | PRN
  Administered 2017-12-21: 100 mg via ORAL
  Filled 2017-12-15 (×2): qty 1

## 2017-12-15 MED ORDER — FERRIC CITRATE 1 GM 210 MG(FE) PO TABS
420.0000 mg | ORAL_TABLET | Freq: Three times a day (TID) | ORAL | Status: DC
  Administered 2017-12-16 (×3): 420 mg via ORAL
  Filled 2017-12-15 (×15): qty 2

## 2017-12-15 MED ORDER — SILVER SULFADIAZINE 1 % EX CREA
1.0000 "application " | TOPICAL_CREAM | Freq: Every day | CUTANEOUS | Status: DC | PRN
  Filled 2017-12-15: qty 85

## 2017-12-15 MED ORDER — CALCIUM ACETATE (PHOS BINDER) 667 MG PO CAPS
1334.0000 mg | ORAL_CAPSULE | ORAL | Status: DC
  Administered 2017-12-18 – 2017-12-20 (×2): 667 mg via ORAL
  Filled 2017-12-15 (×4): qty 2

## 2017-12-15 MED ORDER — DIFLUPREDNATE 0.05 % OP EMUL
1.0000 [drp] | Freq: Two times a day (BID) | OPHTHALMIC | Status: DC
  Administered 2017-12-17 – 2017-12-21 (×6): 1 [drp] via OPHTHALMIC

## 2017-12-15 MED ORDER — FLUTICASONE PROPIONATE 50 MCG/ACT NA SUSP
1.0000 | Freq: Every day | NASAL | Status: DC | PRN
  Administered 2017-12-17 – 2017-12-19 (×2): 1 via NASAL
  Filled 2017-12-15 (×2): qty 16

## 2017-12-15 MED ORDER — AMLODIPINE BESYLATE 10 MG PO TABS
10.0000 mg | ORAL_TABLET | Freq: Every day | ORAL | Status: DC
  Administered 2017-12-16 – 2017-12-20 (×5): 10 mg via ORAL
  Filled 2017-12-15: qty 1
  Filled 2017-12-15: qty 2
  Filled 2017-12-15 (×3): qty 1

## 2017-12-15 MED ORDER — SODIUM CHLORIDE 0.9 % IV SOLN
1.0000 g | INTRAVENOUS | Status: AC
  Administered 2017-12-16 – 2017-12-19 (×4): 1 g via INTRAVENOUS
  Filled 2017-12-15 (×4): qty 1

## 2017-12-15 MED ORDER — INSULIN ASPART 100 UNIT/ML ~~LOC~~ SOLN
0.0000 [IU] | Freq: Three times a day (TID) | SUBCUTANEOUS | Status: DC
  Administered 2017-12-16: 3 [IU] via SUBCUTANEOUS
  Administered 2017-12-16: 2 [IU] via SUBCUTANEOUS
  Administered 2017-12-17: 3 [IU] via SUBCUTANEOUS
  Administered 2017-12-17 (×2): 1 [IU] via SUBCUTANEOUS
  Administered 2017-12-18: 2 [IU] via SUBCUTANEOUS
  Administered 2017-12-18 (×2): 5 [IU] via SUBCUTANEOUS
  Administered 2017-12-19: 2 [IU] via SUBCUTANEOUS
  Administered 2017-12-19: 5 [IU] via SUBCUTANEOUS
  Administered 2017-12-20: 12 [IU] via SUBCUTANEOUS
  Administered 2017-12-20: 5 [IU] via SUBCUTANEOUS
  Administered 2017-12-20: 3 [IU] via SUBCUTANEOUS
  Administered 2017-12-21: 2 [IU] via SUBCUTANEOUS
  Administered 2017-12-21: 7 [IU] via SUBCUTANEOUS
  Administered 2017-12-21: 3 [IU] via SUBCUTANEOUS
  Administered 2017-12-22: 9 [IU] via SUBCUTANEOUS
  Administered 2017-12-22: 2 [IU] via SUBCUTANEOUS
  Administered 2017-12-22: 9 [IU] via SUBCUTANEOUS
  Filled 2017-12-15 (×18): qty 1

## 2017-12-15 MED ORDER — ACETAMINOPHEN 500 MG PO TABS
1000.0000 mg | ORAL_TABLET | Freq: Once | ORAL | Status: AC
  Administered 2017-12-15: 1000 mg via ORAL
  Filled 2017-12-15: qty 2

## 2017-12-15 MED ORDER — CALCIUM ACETATE (PHOS BINDER) 667 MG PO CAPS
2001.0000 mg | ORAL_CAPSULE | Freq: Three times a day (TID) | ORAL | Status: DC
  Administered 2017-12-16 – 2017-12-17 (×3): 2001 mg via ORAL
  Administered 2017-12-18 – 2017-12-20 (×5): 667 mg via ORAL
  Filled 2017-12-15 (×9): qty 3

## 2017-12-15 MED ORDER — CALCIUM ACETATE 667 MG PO CAPS: 1334.0000 mg | ORAL_CAPSULE | ORAL | Status: DC

## 2017-12-15 MED ORDER — SODIUM CHLORIDE 0.9 % IV SOLN
10.0000 mL/h | Freq: Once | INTRAVENOUS | Status: AC
  Administered 2017-12-15: 10 mL/h via INTRAVENOUS

## 2017-12-15 MED ORDER — INSULIN DETEMIR 100 UNIT/ML ~~LOC~~ SOLN
5.0000 [IU] | Freq: Every day | SUBCUTANEOUS | Status: DC
  Administered 2017-12-15: 5 [IU] via SUBCUTANEOUS
  Filled 2017-12-15 (×2): qty 0.05

## 2017-12-15 MED ORDER — VANCOMYCIN HCL IN DEXTROSE 1-5 GM/200ML-% IV SOLN
1000.0000 mg | INTRAVENOUS | Status: AC
  Administered 2017-12-17 – 2017-12-19 (×2): 1000 mg via INTRAVENOUS
  Filled 2017-12-15 (×4): qty 200

## 2017-12-15 NOTE — ED Notes (Signed)
Lab in room to draw labs

## 2017-12-15 NOTE — Consult Note (Signed)
Pharmacy Antibiotic Note  Mark [name]. is a 71 y.o. male admitted on 12/15/2017 with sepsis.  Pharmacy has been consulted for Vancomycin dosing. Patient received vancomycin 1g IV x 1 dose in ED. Patient is also on ceftriaxone. Patient has a PMH of ESRD and HD MWF.   Plan: Will order Vancomycin 1g IV x 1 dose for total of Vancomycin 2g LD. Will order vancomycin 1g IV to be given with HD on MWF.   Will order Vancomycin level prior to 3rd HD. Pre-HD target level 15-25 mcg/mL.  Height: 5\' 11"  (180.3 cm) Weight: 190 lb (86.2 kg) IBW/kg (Calculated) : 75.3  Temp (24hrs), Avg:101.2 F (38.4 C), Min:99 F (37.2 C), Max:103.3 F (39.6 C)  Recent Labs  Lab 12/15/17 1748  WBC 5.2  CREATININE 6.06*  LATICACIDVEN 1.8    Estimated Creatinine Clearance: 11.7 mL/min (A) (by C-G formula based on SCr of 6.06 mg/dL (H)).    Allergies  Allergen Reactions  . Heparin Nausea Only  . Ibuprofen     DUE TO DIALYSIS  . Multivitamin [Centrum]     DUE TO DIALYSIS  . Daypro [Oxaprozin] Swelling and Rash    Other reaction(s): Other (See Comments)  . Tape Rash    Antimicrobials this admission: 9/14 Vancomycin >>  9/14 Ceftriaxone >>   Microbiology results: 9/14 BCx: pending  9/14 UCx: pending   Thank you for allowing pharmacy to be a part of this patient's care.  Pernell Dupre, PharmD, BCPS Clinical Pharmacist 12/15/2017 9:46 PM

## 2017-12-15 NOTE — ED Notes (Signed)
Dr. Archie Balboa notified pt is possible sepsis pt.

## 2017-12-15 NOTE — ED Notes (Addendum)
Spoke with lab coming to draw labs that were not able to be collected. Per Dr. Archie Balboa since lab is on the way to draw second blood cultures start antibiotics after that.

## 2017-12-15 NOTE — ED Notes (Addendum)
Pt upset that O2 not helping with breathing even though O2 sat of 99%. Pt on 4L [place] at this time. Will continue to monitor. MD aware.

## 2017-12-15 NOTE — Progress Notes (Signed)
Patient ID: Mark Skeeter., male   DOB: [birth year], 72 y.o.   MRN: [id number]  ACP note  Patient and family at bedside  Diagnosis:Clinical sepsis, acute on chronic anemia, end-stage renal disease with hyperkalemia, hypertension, CLL, history of stroke, type 2 diabetes   CODE STATUS discussed and patient is a full code  Plan.  Aggressive antibiotics and follow-up cultures.  Transfuse if able to get a type and cross.  Time spent on ACP discussion 17 minutes Dr. Loletha Grayer

## 2017-12-15 NOTE — ED Notes (Addendum)
Multiple attempts made for mutitple IVs, able to get 1 IV placed and labs with one set of blood cultures.  Notified the lab that a second set will need to be drawn.  First set of cultures drawn 1748

## 2017-12-15 NOTE — ED Provider Notes (Signed)
[facility] Emergency Department Provider Note  ____________________________________________   I have reviewed the triage vital signs and the nursing notes.   HISTORY  Chief Complaint AMS  History limited by: Altered Mental Status   HPI Mark [name]. is a 72 y.o. male who presents to the emergency department today from home because of concern for altered mental status. Apparently family noticed this today. The patient himself is aware that his mind has not been working right today. Did state that he took more of his melatonin last night than he was supposed to which did help him sleep. Complaining of some abdominal discomfort.    Per medical record review patient has a history of CKD on dialysis.   Past Medical History:  Diagnosis Date  . Anemia   . Arthritis   . Bladder cancer ([place])   . Chronic kidney disease   . Diabetes mellitus without complication ([place])   . Dialysis patient [facility])    M,W,F  . Dyspnea    DOE  . GERD (gastroesophageal reflux disease)   . HOH (hard of hearing)   . Hypertension   . IBS (irritable bowel syndrome)   . Lymphoma ([place]) 09/27/2014  . Neuropathy    RIGHT LEG  . Stroke [facility])    TIA    Patient Active Problem List   Diagnosis Date Noted  . Fluid overload 01/15/2017  . UTI (urinary tract infection) 10/20/2016  . Sepsis ([place]) 10/20/2016  . 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 Ulcer of foot, chronic ([place]) 03/21/2015  . History of bladder cancer 11/10/2014  . Penile bleeding 10/12/2014  . Dependence on renal dialysis ([place]) 08/18/2013  . Arterial blood pressure decreased 08/18/2013  . Leukemia ([place]) 08/18/2013  . Retina disorder 08/18/2013  . Breath shortness 08/18/2013  . End-stage renal disease on hemodialysis ([place]) 12/23/2012  . Absolute anemia 07/18/2012  . HPTH (hyperparathyroidism) ([place]) 07/18/2012  . Acidosis, metabolic  38/25/0539  . Hyperparathyroidism ([place]) 07/18/2012  . Abnormal presence of protein in urine 01/01/2012  . HLD (hyperlipidemia) 12/25/2011  . Diabetic retinopathy associated with type 2 diabetes mellitus ([place]) 10/26/1999  . Essential (primary) hypertension 04/04/1999  . Diabetes mellitus type 2, uncontrolled ([place]) 04/04/1999  . Type 2 diabetes mellitus with other diabetic kidney complication ([place]) 76/73/4193    Past Surgical History:  Procedure Laterality Date  . BACK SURGERY  2007  . CATARACT EXTRACTION W/PHACO Left 03/23/2016   Procedure: CATARACT EXTRACTION PHACO AND INTRAOCULAR LENS PLACEMENT (IOC);  Surgeon: Leandrew Koyanagi, MD;  Location: [facility] ORS;  Service: Ophthalmology;  Laterality: Left;  [place] 52.6 AP% 14.7 CDE 7.72 Fluid pack lot # [id number]  . CATARACT EXTRACTION W/PHACO Right 05/30/2016   Procedure: CATARACT EXTRACTION PHACO AND INTRAOCULAR LENS PLACEMENT ([place]);  Surgeon: Leandrew Koyanagi, MD;  Location: [facility] ORS;  Service: Ophthalmology;  Laterality: Right;  [place] 01:08 AP% 13.9 CDE 9.53  note: could not get IV in patient, so procedure done without anesthesia personell present, ok per Dr Wallace Going fluid pack lo t# [id number]  . CIRCUMCISION    . PERIPHERAL VASCULAR CATHETERIZATION Left 08/19/2015   Procedure: A/V Shuntogram/Fistulagram;  Surgeon: Algernon Huxley, MD;  Location: [place] CV LAB;  Service: Cardiovascular;  Laterality: Left;  . PERIPHERAL VASCULAR CATHETERIZATION N/A 08/19/2015   Procedure: A/V Shunt Intervention;  Surgeon: Algernon Huxley, MD;  Location: [place] CV LAB;  Service: Cardiovascular;  Laterality: N/A;    Prior  to Admission medications   Medication Sig Start Date End Date Taking? Authorizing Provider  amLODipine (NORVASC) 10 MG tablet Take 10 mg by mouth daily.  06/02/13   [provider]  aspirin 81 MG EC tablet Take 81 mg by mouth daily.     [provider]  calcium acetate (PHOSLO) 667 MG capsule Take 1,334-2,001 mg by  mouth See admin instructions. 3 caps with meals and 2 caps with snacks    [provider]  Difluprednate (DUREZOL) 0.05 % EMUL Place 1 drop into the left eye 4 (four) times daily.    [provider]  ENSURE PLUS (ENSURE PLUS) LIQD Take by mouth 3 (three) times a week. M,W.F 01/26/14   [provider]  ferric citrate (AURYXIA) 1 GM 210 MG(Fe) tablet Take 420 mg by mouth 3 (three) times daily with meals.    [provider]  fluticasone (FLONASE) 50 MCG/ACT nasal spray Place 1 spray into both nostrils daily as needed for allergies.  12/14/14   [provider]  furosemide (LASIX) 80 MG tablet Take 80-160 mg by mouth See admin instructions. 2 tablets in am- on sat, Sunday, Tuesday and Thursday 1 tablet in PM- on Sundays -Saturdays    [provider]  gabapentin (NEURONTIN) 100 MG capsule Take 1 capsule (100 mg total) by mouth 3 (three) times daily. Patient taking differently: Take 200-300 mg by mouth 2 (two) times daily.  03/02/16   Edrick Kins, DPM  glimepiride (AMARYL) 4 MG tablet Take 8 mg by mouth at bedtime. Prn glucose levels 10/06/14   [provider]  ibrutinib (IMBRUVICA) 140 MG capsul Take 2 capsules (280 mg total) by mouth 3 (three) times a week. Take following dialysis. 10/08/17   Cammie Sickle, MD  LEVEMIR FLEXTOUCH 100 UNIT/ML Pen Inject 25-30 Units into the skin daily at 10 pm. Depending on what he eats 08/21/14   [provider]  lovastatin (MEVACOR) 20 MG tablet Take 1 tablet by mouth daily. 12/25/16   [provider]  metoCLOPramide (REGLAN) 5 MG tablet Take 5 mg by mouth 3 (three) times daily before meals. Patient takes 1 tab with each meal.    [provider]  metoprolol tartrate (LOPRESSOR) 100 MG tablet Take 25 mg by mouth See admin instructions. Pt takes Tues morning, Friday Night, and Saturday morning and evening    [provider]  multivitamin (RENA-VIT) TABS tablet Take 1 tablet  by mouth at bedtime.     [provider]  nystatin cream (MYCOSTATIN) Apply 1 application topically 2 (two) times daily.    [provider]  silver sulfADIAZINE (SILVADENE) 1 % cream APPLY 1 APPLICATION TOPICALY DAILY 04/12/17   Edrick Kins, DPM    Allergies Ibuprofen; Multivitamin [centrum]; Daypro [oxaprozin]; and Tape  Family History  Problem Relation Age of Onset  . Cancer Mother   . Kidney cancer Neg Hx   . Prostate cancer Neg Hx   . Kidney failure Neg Hx   . Bladder Cancer Neg Hx     Social History Social History   Tobacco Use  . Smoking status: Former Smoker    Types: Cigarettes    Last attempt to quit: 05/19/2013    Years since quitting: 4.5  . Smokeless tobacco: Never Used  . Tobacco comment: quit  Substance Use Topics  . Alcohol use: No    Alcohol/week: 0.0 standard drinks  . Drug use: No    Review of Systems Constitutional: No fever/chills Eyes:  No visual changes. ENT: No sore throat. Cardiovascular: Denies chest pain. Respiratory: Denies shortness of breath. Gastrointestinal: Positive for abdominal pain.  Genitourinary: Negative for dysuria. Musculoskeletal: Negative for back pain. Skin: Negative for rash. Neurological: Positive for altered mentation ____________________________________________   PHYSICAL EXAM:  VITAL SIGNS: ED Triage Vitals  Enc Vitals Group     BP 136/54     Pulse 95     Resp 24     Temp 103.3     Temp src      SpO2 96   Constitutional: Awake and alert.  Eyes: Conjunctivae are normal.  ENT      Head: Normocephalic and atraumatic.      Nose: No congestion/rhinnorhea.      Mouth/Throat: Mucous membranes are moist.      Neck: No stridor. Hematological/Lymphatic/Immunilogical: No cervical lymphadenopathy. Cardiovascular: Normal rate, regular rhythm.  No murmurs, rubs, or gallops. Respiratory: Normal respiratory effort without tachypnea nor retractions. Breath sounds are clear and equal bilaterally. No  wheezes/rales/rhonchi. Gastrointestinal: Soft and tender to palpation. No rebound. No guarding.  Genitourinary: Deferred Musculoskeletal: Normal range of motion in all extremities. No lower extremity edema. Neurologic:  Normal speech and language. No gross focal neurologic deficits are appreciated.  Skin:  Skin is warm, dry and intact. No rash noted. Psychiatric: Mood and affect are normal. Speech and behavior are normal. Patient exhibits appropriate insight and judgment.  ____________________________________________    LABS (pertinent positives/negatives)  Lactic 1.8 CBC wbc 5.2, hgb 6.6, plt 117 CMP na 139, k 5.3, cr 6.06 UA cloudy, trace leukocytes, 6-10 rbc, 11-20 wbc, few bacteria INR 1.09  ____________________________________________   EKG  I, Nance Pear, attending physician, personally viewed and interpreted this EKG  EKG Time: 1728 Rate: 92 Rhythm: sinus rhythm Axis: normal Intervals: qtc 447 QRS: narrow, q waves, v1 ST changes: no st elevation Impression: abnormal ekg  ____________________________________________    RADIOLOGY  None   ____________________________________________   PROCEDURES  Procedures  CRITICAL CARE Performed by: Nance Pear   Total critical care time: 35 minutes  Critical care time was exclusive of separately billable procedures and treating other patients.  Critical care was necessary to treat or prevent imminent or life-threatening deterioration.  Critical care was time spent personally by me on the following activities: development of treatment plan with patient and/or surrogate as well as nursing, discussions with consultants, evaluation of patient's response to treatment, examination of patient, obtaining history from patient or surrogate, ordering and performing treatments and interventions, ordering and review of laboratory studies, ordering and review of radiographic studies, pulse oximetry and re-evaluation of  patient's condition.  ____________________________________________   INITIAL IMPRESSION / ASSESSMENT AND PLAN / ED COURSE  Pertinent labs & imaging results that were available during my care of the patient were reviewed by me and considered in my medical decision making (see chart for details).   Patient presented to the emergency department today because of concerns for altered mental status.  On exam patient is slightly altered.  He was noted be quite febrile.  Patient's work-up is concerning for urinary tract infection.  Family states he has had these in the past.  Given fever and urinary tract infection will start IV antibiotics plan on admission.  In addition patient was found to be anemic.  Patient does have history of chronic anemia however given today's value of 6.6 did discuss transfusion with patient and family.  Will plan on admission.   ____________________________________________   FINAL CLINICAL IMPRESSION(S) / ED DIAGNOSES  Final diagnoses:  Altered mental status, unspecified altered mental status type  Lower urinary tract infectious disease  Anemia, unspecified type     Note: This dictation was prepared with Dragon dictation. Any transcriptional errors that result from this process are unintentional     Nance Pear, MD 12/15/17 [phone]

## 2017-12-15 NOTE — ED Triage Notes (Signed)
Pt arrived via EMS from home with reports of altered mental status. Pt is alert and responding, but making frequent inappropriate jokes.  Pt is on dialysis MWF with last treatment yesterday.  Family reported pt had UTI a few months ago.  Family reported to EMS that pt's behavior is not normal.  Pt reported he took 3 melatonin last night as well.

## 2017-12-15 NOTE — Progress Notes (Signed)
CODE SEPSIS - PHARMACY COMMUNICATION  **Broad Spectrum Antibiotics should be administered within 1 hour of Sepsis diagnosis**  Time Code Sepsis Called/Page Received: @ 1800  Antibiotics Ordered: Ceftriaxone @ 1845  Time of 1st antibiotic administration: @ 1928  Additional action taken by pharmacy:  @1805  nurse documented that MD did not want to start Abx until Bcx were drawn. Lab was contacted for lab draw.  @~ 1830 spoke with nurse about getting abx ordered and started.  @ 1900 nurse stated that lab was still trying to get labs drawn and Abx would be started immediately after   If necessary, Name of Provider/Nurse Contacted: Nicole Kindred, RN, [place], RN  Pernell Dupre, PharmD, BCPS Clinical Pharmacist 12/15/2017 7:50 PM

## 2017-12-15 NOTE — ED Notes (Addendum)
Informed Dr. Archie Balboa unable to get second IV access and second set of cultures, informed him that the lab will be coming to attempt the second set of blood cultures. Spoke with Dr. Archie Balboa - does not want to start fluids or antibiotics at this time.

## 2017-12-15 NOTE — H&P (Signed)
[place] [facility] [place] NAME: Mark [name]    MR#:  [id number]  DATE OF BIRTH:  02-07-1946  DATE OF ADMISSION:  12/15/2017  PRIMARY CARE PHYSICIAN: Albina Billet, MD   REQUESTING/REFERRING PHYSICIAN: Dr Charolett Bumpers.  CHIEF COMPLAINT:   Chief Complaint  Patient presents with  . Altered Mental Status    HISTORY OF PRESENT ILLNESS:  Mark [name]  is a 72 y.o. male with a known history of end-stage renal disease.  He presents with a temperature of 103 he was very weak and he could inject dressed and he was altered.  Patient is feeling a little bit better now.  States he had some fever and chills last night.  Having some weight loss.  Feeling fatigued.  Some abdominal pain because he has to go to the bathroom.  Occasional cough which is dry and some shortness of breath.  Hospitalist were contacted for further evaluation  PAST MEDICAL HISTORY:   Past Medical History:  Diagnosis Date  . Anemia   . Arthritis   . Bladder cancer ([place])   . Chronic kidney disease   . Diabetes mellitus without complication ([place])   . Dialysis patient [facility])    M,W,F  . Dyspnea    DOE  . GERD (gastroesophageal reflux disease)   . HOH (hard of hearing)   . Hypertension   . IBS (irritable bowel syndrome)   . Lymphoma ([place]) 09/27/2014  . Neuropathy    RIGHT LEG  . Stroke [facility])    TIA    PAST SURGICAL HISTORY:   Past Surgical History:  Procedure Laterality Date  . BACK SURGERY  2007  . CATARACT EXTRACTION W/PHACO Left 03/23/2016   Procedure: CATARACT EXTRACTION PHACO AND INTRAOCULAR LENS PLACEMENT (IOC);  Surgeon: Leandrew Koyanagi, MD;  Location: [facility] ORS;  Service: Ophthalmology;  Laterality: Left;  [place] 52.6 AP% 14.7 CDE 7.72 Fluid pack lot # [id number]  . CATARACT EXTRACTION W/PHACO Right 05/30/2016   Procedure: CATARACT EXTRACTION PHACO AND INTRAOCULAR LENS PLACEMENT ([place]);  Surgeon: Leandrew Koyanagi, MD;  Location: [facility] ORS;  Service:  Ophthalmology;  Laterality: Right;  [place] 01:08 AP% 13.9 CDE 9.53  note: could not get IV in patient, so procedure done without anesthesia personell present, ok per Dr Wallace Going fluid pack lo t# [id number]  . CIRCUMCISION    . PERIPHERAL VASCULAR CATHETERIZATION Left 08/19/2015   Procedure: A/V Shuntogram/Fistulagram;  Surgeon: Algernon Huxley, MD;  Location: [place] CV LAB;  Service: Cardiovascular;  Laterality: Left;  . PERIPHERAL VASCULAR CATHETERIZATION N/A 08/19/2015   Procedure: A/V Shunt Intervention;  Surgeon: Algernon Huxley, MD;  Location: [place] CV LAB;  Service: Cardiovascular;  Laterality: N/A;    SOCIAL HISTORY:   Social History   Tobacco Use  . Smoking status: Former Smoker    Types: Cigarettes    Last attempt to quit: 05/19/2013    Years since quitting: 4.5  . Smokeless tobacco: Never Used  . Tobacco comment: quit  Substance Use Topics  . Alcohol use: No    Alcohol/week: 0.0 standard drinks    FAMILY HISTORY:   Family History  Problem Relation Age of Onset  . Cancer Mother   . Kidney cancer Neg Hx   . Prostate cancer Neg Hx   . Kidney failure Neg Hx   . Bladder Cancer Neg Hx     DRUG ALLERGIES:   Allergies  Allergen Reactions  . Heparin Nausea Only  . Ibuprofen  DUE TO DIALYSIS  . Multivitamin [Centrum]     DUE TO DIALYSIS  . Daypro [Oxaprozin] Swelling and Rash    Other reaction(s): Other (See Comments)  . Tape Rash    REVIEW OF SYSTEMS:  CONSTITUTIONAL: Positive for fever and chills.  Positive for weight loss.  Positive for fatigue.  EYES: Left eye vision not good.  Bloodshot left eye after straining for bowel movement. EARS, NOSE, AND THROAT: No tinnitus or ear pain. No sore throat.  Some nasal blood clots RESPIRATORY: Occasional cough.  Positive for shortness of breath.  No wheezing or hemoptysis.  CARDIOVASCULAR: No chest pain, orthopnea, edema.  GASTROINTESTINAL: No nausea, vomiting, diarrhea.  Some abdominal pain. No blood in bowel  movements GENITOURINARY: No dysuria, hematuria.  ENDOCRINE: No polyuria, nocturia,  HEMATOLOGY: No anemia, easy bruising or bleeding SKIN: No rash or lesion. MUSCULOSKELETAL: No joint pain or arthritis.   NEUROLOGIC: No tingling, numbness, weakness.  PSYCHIATRY: No anxiety or depression.   MEDICATIONS AT HOME:   Prior to Admission medications   Medication Sig Start Date End Date Taking? Authorizing Provider  amLODipine (NORVASC) 10 MG tablet Take 10 mg by mouth daily.  06/02/13  Yes [provider]  aspirin 81 MG EC tablet Take 81 mg by mouth daily.    Yes [provider]  calcium acetate (PHOSLO) 667 MG capsule Take 1,334-2,001 mg by mouth See admin instructions. 3 caps with meals and 2 caps with snacks   Yes [provider]  Difluprednate (DUREZOL) 0.05 % EMUL Place 1 drop into the left eye 2 (two) times daily.    Yes [provider]  docusate sodium (COLACE) 100 MG capsule Take 100 mg by mouth daily as needed for mild constipation.   Yes [provider]  ENSURE PLUS (ENSURE PLUS) LIQD Take 1 Can by mouth daily.  01/26/14  Yes [provider]  ferric citrate (AURYXIA) 1 GM 210 MG(Fe) tablet Take 420 mg by mouth 3 (three) times daily with meals.   Yes [provider]  fluticasone (FLONASE) 50 MCG/ACT nasal spray Place 1 spray into both nostrils daily as needed for allergies.  12/14/14  Yes [provider]  gabapentin (NEURONTIN) 100 MG capsule Take 1 capsule (100 mg total) by mouth 3 (three) times daily. Patient taking differently: Take 100 mg by mouth 2 (two) times daily.  03/02/16  Yes Edrick Kins, DPM  glimepiride (AMARYL) 4 MG tablet Take 4 mg by mouth at bedtime. Prn glucose levels 10/06/14  Yes [provider]  LEVEMIR FLEXTOUCH 100 UNIT/ML Pen Inject 5-6 Units into the skin daily at 10 pm.  08/21/14  Yes [provider]  lovastatin (MEVACOR) 20 MG tablet Take 1 tablet by mouth daily. 12/25/16  Yes  [provider]  metoCLOPramide (REGLAN) 5 MG tablet Take 5 mg by mouth 3 (three) times daily before meals. Patient takes 1 tab with each meal.   Yes [provider]  multivitamin (RENA-VIT) TABS tablet Take 1 tablet by mouth at bedtime.    Yes [provider]  nystatin cream (MYCOSTATIN) Apply 1 application topically 2 (two) times daily as needed (irritation).    Yes [provider]  Polyvinyl Alcohol-Povidone PF (REFRESH) 1.4-0.6 % SOLN Apply 1 drop to eye 3 (three) times daily as needed (Dry eyes).   Yes [provider]  silver sulfADIAZINE (SILVADENE) 1 % cream APPLY 1 APPLICATION TOPICALY DAILY Patient taking differently: Apply 1 application topically daily as needed (sores).  04/12/17  Yes Edrick Kins, DPM      VITAL SIGNS:  Blood pressure (!) 147/63, pulse 90, temperature (!) 103.3 F (39.6 C), temperature source Oral, resp. rate 19, height 5\' 11"  (1.803 m), weight 86.2 kg, SpO2 99 %.  PHYSICAL EXAMINATION:  GENERAL:  72 y.o.-year-old patient lying in the bed with no acute distress.  EYES: Pupils equal, round, reactive to light and accommodation. No scleral icterus. Extraocular muscles intact.  HEENT: Head atraumatic, normocephalic. Oropharynx and nasopharynx clear.  NECK:  Supple, no jugular venous distention. No thyroid enlargement, no tenderness.  LUNGS: Decreased breath sounds bilateral bases, no wheezing, rales,rhonchi or crepitation. No use of accessory muscles of respiration.  CARDIOVASCULAR: S1, S2 normal. No murmurs, rubs, or gallops.  ABDOMEN: Soft, nontender, nondistended. Bowel sounds present. No organomegaly or mass.  EXTREMITIES: Trace pedal edema, no cyanosis, or clubbing.  NEUROLOGIC: Cranial nerves II through XII are intact. Muscle strength 5/5 in all extremities. Sensation intact. Gait not checked.  PSYCHIATRIC: The patient is alert and oriented x 3.  SKIN: No rash, lesion, or ulcer.  Callus on the bottom of the left  foot  LABORATORY PANEL:   CBC Recent Labs  Lab 12/15/17 1748  WBC 5.2  HGB 6.6*  HCT 20.4*  PLT 117*   ------------------------------------------------------------------------------------------------------------------  Chemistries  Recent Labs  Lab 12/15/17 1748  NA 139  K 5.3*  CL 101  CO2 27  GLUCOSE 130*  BUN 39*  CREATININE 6.06*  CALCIUM 7.7*  AST 25  ALT 26  ALKPHOS 72  BILITOT 1.0   ------------------------------------------------------------------------------------------------------------------    EKG:   Sinus rhythm 92 bpm  IMPRESSION AND PLAN:   1.  Clinical sepsis.  Aggressive antibiotics with Rocephin and vancomycin for now.  Blood cultures.  Urine culture.  Obtain chest x-ray. 2.  Acute on chronic anemia.  Patient has a history of bladder cancer.  Hold aspirin.  Send off a ferritin.  Try to transfuse a unit of blood if able to get a type and cross. 3.  End-stage renal disease with hyperkalemia.  Give a dose of lokelma.  Case discussed with Dr. Holley Raring nephrology 4.  Hypertension on Norvasc 5.  History of CLL 6.  History of stroke hold aspirin for now 7.  Type 2 diabetes mellitus on sliding scale insulin and Lantus.  Hold oral medications at this time.    All the records are reviewed and case discussed with ED provider. Management plans discussed with the patient, family and they are in agreement.  CODE STATUS: full code  TOTAL TIME TAKING CARE OF THIS PATIENT: 50 minutes, including acp time.    Loletha Grayer M.D on 12/15/2017 at 8:38 PM  Between 7am to 6pm - Pager - [phone]  After 6pm call admission pager [phone]  [facility]  [phone]  CC: Primary care physician; Albina Billet, MD

## 2017-12-15 NOTE — ED Notes (Signed)
Per Lab Home, unable to collect second set of blood cultures or Type and Screen. MD Elnoria Howard starting antibiotics as well as waiting to collect Type and Screen.

## 2017-12-16 ENCOUNTER — Other Ambulatory Visit: Payer: Self-pay

## 2017-12-16 ENCOUNTER — Encounter: Payer: Self-pay | Admitting: *Deleted

## 2017-12-16 DIAGNOSIS — L8990 Pressure ulcer of unspecified site, unspecified stage: Secondary | ICD-10-CM

## 2017-12-16 LAB — PREPARE RBC (CROSSMATCH)

## 2017-12-16 LAB — GLUCOSE, CAPILLARY
GLUCOSE-CAPILLARY: 197 mg/dL — AB (ref 70–99)
GLUCOSE-CAPILLARY: 204 mg/dL — AB (ref 70–99)
GLUCOSE-CAPILLARY: 90 mg/dL (ref 70–99)
Glucose-Capillary: 173 mg/dL — ABNORMAL HIGH (ref 70–99)

## 2017-12-16 LAB — CBC
HCT: 24.2 % — ABNORMAL LOW (ref 40.0–52.0)
HEMOGLOBIN: 8.2 g/dL — AB (ref 13.0–18.0)
MCH: 32.7 pg (ref 26.0–34.0)
MCHC: 33.7 g/dL (ref 32.0–36.0)
MCV: 96.9 fL (ref 80.0–100.0)
Platelets: 96 10*3/uL — ABNORMAL LOW (ref 150–440)
RBC: 2.5 MIL/uL — ABNORMAL LOW (ref 4.40–5.90)
RDW: 18.6 % — AB (ref 11.5–14.5)
WBC: 4.3 10*3/uL (ref 3.8–10.6)

## 2017-12-16 LAB — LACTIC ACID, PLASMA: LACTIC ACID, VENOUS: 0.7 mmol/L (ref 0.5–1.9)

## 2017-12-16 MED ORDER — SALINE SPRAY 0.65 % NA SOLN
1.0000 | NASAL | Status: DC | PRN
  Administered 2017-12-16 – 2017-12-19 (×4): 1 via NASAL
  Filled 2017-12-16: qty 44

## 2017-12-16 MED ORDER — LACTULOSE 10 GM/15ML PO SOLN
20.0000 g | Freq: Once | ORAL | Status: AC
  Administered 2017-12-16: 20 g via ORAL
  Filled 2017-12-16: qty 30

## 2017-12-16 MED ORDER — INSULIN DETEMIR 100 UNIT/ML ~~LOC~~ SOLN
7.0000 [IU] | Freq: Every day | SUBCUTANEOUS | Status: DC
  Administered 2017-12-17: 7 [IU] via SUBCUTANEOUS
  Filled 2017-12-16 (×3): qty 0.07

## 2017-12-16 NOTE — Progress Notes (Signed)
[facility] [place] NAME: Jawanza Zambito    MR#:  [id number]  DATE OF BIRTH:  [birth year]  SUBJECTIVE:admitted for altered mental status with fever up to 103 Fahrenheit at home with generalized weakness.  Omitted for evaluation of clinical sepsis.  CHIEF COMPLAINT:   Chief Complaint  Patient presents with  . Altered Mental Status  He feels better today.  Low-grade temp  99.6 Fahrenheit.  REVIEW OF SYSTEMS:   Review of Systems  Constitutional: Positive for malaise/fatigue and weight loss. Negative for chills and fever.  HENT: Negative for hearing loss.   Eyes: Negative for blurred vision, double vision and photophobia.  Respiratory: Positive for cough. Negative for hemoptysis and shortness of breath.   Cardiovascular: Negative for palpitations, orthopnea and leg swelling.  Gastrointestinal: Negative for abdominal pain, diarrhea and vomiting.  Genitourinary: Negative for dysuria and urgency.  Musculoskeletal: Negative for myalgias and neck pain.  Skin: Negative for rash.  Neurological: Negative for dizziness, focal weakness, seizures, weakness and headaches.  Psychiatric/Behavioral: Negative for memory loss. The patient does not have insomnia.     DRUG ALLERGIES:   Allergies  Allergen Reactions  . Heparin Nausea Only  . Ibuprofen     DUE TO DIALYSIS  . Multivitamin [Centrum]     DUE TO DIALYSIS  . Daypro [Oxaprozin] Swelling and Rash    Other reaction(s): Other (See Comments)  . Tape Rash    VITALS:  Blood pressure (!) 148/64, pulse 79, temperature 99.6 F (37.6 C), temperature source Oral, resp. rate (!) 22, height 5\' 11"  (1.803 m), weight 86.2 kg, SpO2 99 %.  PHYSICAL EXAMINATION:  GENERAL:  72 y.o.-year-old patient lying in the bed with no acute distress.  EYES: Pupils equal, round, reactive to light and accommodation. No scleral icterus. Extraocular muscles intact.  HEENT: Head atraumatic, normocephalic. Oropharynx  and nasopharynx clear.  NECK:  Supple, no jugular venous distention. No thyroid enlargement, no tenderness.  LUNGS: Diminished air entry bilaterally.[name]  CARDIOVASCULAR: S1, S2 normal. No murmurs, rubs, or gallops.  ABDOMEN: Soft, nontender, nondistended. Bowel sounds present. No organomegaly or mass.  EXTREMITIES: Trace pedal edema.  Cyanosis, or clubbing.  NEUROLOGIC: Cranial nerves II through XII are intact. Muscle strength 5/5 in all extremities. Sensation intact. Gait not checked.  PSYCHIATRIC: The patient is alert and oriented x 3.  SKIN: No obvious rash, lesion, or ulcer.    LABORATORY PANEL:   CBC Recent Labs  Lab 12/16/17 1005  WBC 4.3  HGB 8.2*  HCT 24.2*  PLT 96*   ------------------------------------------------------------------------------------------------------------------  Chemistries  Recent Labs  Lab 12/15/17 1748  NA 139  K 5.3*  CL 101  CO2 27  GLUCOSE 130*  BUN 39*  CREATININE 6.06*  CALCIUM 7.7*  AST 25  ALT 26  ALKPHOS 72  BILITOT 1.0   ------------------------------------------------------------------------------------------------------------------  Cardiac Enzymes No results for input(s): TROPONINI in the last 168 hours. ------------------------------------------------------------------------------------------------------------------  RADIOLOGY:  Dg Chest Port 1 View  Result Date: 12/15/2017 CLINICAL DATA:  Altered mental status with cough. EXAM: PORTABLE CHEST 1 VIEW COMPARISON:  01/15/2017 and PET-CT 11/30/2016 FINDINGS: Lungs are adequately inflated and demonstrate hazy prominence of the perihilar markings likely interstitial edema. No effusion. Cardiomediastinal silhouette and remainder the exam is unchanged. IMPRESSION: Findings suggesting mild interstitial edema. Electronically Signed   By: Marin Olp M.D.   On: 12/15/2017 21:06    EKG:   Orders placed or performed during the hospital encounter of 12/15/17  . EKG  12-Lead  .  EKG 12-Lead    ASSESSMENT AND PLAN:   #1 clinical sepsis with fever on admission: On Rocephin, vancomycin, and cultures have been negative for 12 hours, and cultures are pending.  Patient is feeling better, he was hypotensive yesterday but now better,but  still has low-grade temp.  The biotics. 2.  Acute on chronic anemia: History of bladder cancer: Symptomatic anemia, hemoglobin was 6.7 on admission status post 1 unit of packed RBC transfusion, hemoglobin improved to 8.2, hematocrit 24.2.  3.  ESRD with hyperkalemia: Is/ppost Kayexalate.  Appreciate nephrology consult. #4 constipation: Patient says he takes laxatives every day.  Lactulose is ordered. 5.  GERD: Continue PPI 6.  Diabetes mellitus type 2: Uncontrolled.  Adjust the dose of Levemir now.    Patient is on Amaryl, Levemir at home, started on Levemir.,  Sliding scale insulin coverage while in the hospital. 7.  Left eye infection: Patient does use special eyedrops which we do not have it here.  Asked the family to get his Durezol that he takes for left eye infection.  More than 50% of spine is spent in counseling, coordination of care  .All the records are reviewed and case discussed with Care Management/Social Workerr. Management plans discussed with the patient, family and they are in agreement.  CODE STATUS: full  TOTAL TIME TAKING CARE OF THIS PATIENT: 85minutes.   POSSIBLE D/C IN 1-2DAYS, DEPENDING ON CLINICAL CONDITION.   [name] M.D on 12/16/2017 at 11:24 AM  Between 7am to 6pm - Pager - [phone]  After 6pm go to [email or web address] - password [email or web address] [facility]  Office  [phone]  CC: Primary care physician; Albina Billet, MD   Note: This dictation was prepared with Dragon dictation along with smaller phrase technology. Any transcriptional errors that result from this process are unintentional.

## 2017-12-16 NOTE — Plan of Care (Signed)
The patient was able to have a  bowel movement today. No falls Pt came to see the patient yet could not work with the patient well was missing his special shoes for R foot drop. Temp this evening was 99.87F. Dialysis tomorrow. Problem: Health Behavior/Discharge Planning: Goal: Ability to manage health-related needs will improve Outcome: Progressing   Problem: Clinical Measurements: Goal: Ability to maintain clinical measurements within normal limits will improve Outcome: Progressing Goal: Will remain free from infection Outcome: Progressing Goal: Diagnostic test results will improve Outcome: Progressing Goal: Respiratory complications will improve Outcome: Progressing Goal: Cardiovascular complication will be avoided Outcome: Progressing   Problem: Activity: Goal: Risk for activity intolerance will decrease Outcome: Progressing   Problem: Nutrition: Goal: Adequate nutrition will be maintained Outcome: Progressing   Problem: Coping: Goal: Level of anxiety will decrease Outcome: Progressing   Problem: Elimination: Goal: Will not experience complications related to bowel motility Outcome: Progressing Goal: Will not experience complications related to urinary retention Outcome: Progressing

## 2017-12-16 NOTE — Progress Notes (Signed)
[facility]  ROUNDING NOTE   Subjective:  Patient well-known to [place] as we follow him for outpatient hemodialysis. He last had dialysis on Friday. He presented with altered mental status. He had a temperature of 103 while at home. Blood cultures thus far negative. Hemoglobin was also low at 6.6 and patient was transfused 1 unit.   Objective:  Vital signs in last 24 hours:  Temp:  [98.7 F (37.1 C)-103.3 F (39.6 C)] 99.1 F (37.3 C) (09/15 1231) Pulse Rate:  [76-95] 78 (09/15 1231) Resp:  [16-24] 22 (09/15 0548) BP: (99-148)/(48-86) 138/58 (09/15 1231) SpO2:  [93 %-100 %] 94 % (09/15 1231) Weight:  [86.2 kg] 86.2 kg (09/14 1731)  Weight change:  Filed Weights   12/15/17 1731  Weight: 86.2 kg    Intake/Output: I/O last 3 completed shifts: In: 11 [Blood:330; IV Piggyback:160] Out: -    Intake/Output this shift:  Total I/O In: 120 [P.O.:120] Out: -   Physical Exam: General: No acute distress  Head: Normocephalic, atraumatic. Moist oral mucosal membranes  Eyes: Anicteric  Neck: Supple, trachea midline  Lungs:  Clear to auscultation, normal effort  Heart: S1S2 no rubs  Abdomen:  Soft, nontender, bowel sounds present  Extremities: Trace peripheral edema.  Neurologic: Awake, alert, following commands  Skin: No lesions  Access: LUE AVF    Basic Metabolic Panel: Recent Labs  Lab 12/15/17 1748  NA 139  K 5.3*  CL 101  CO2 27  GLUCOSE 130*  BUN 39*  CREATININE 6.06*  CALCIUM 7.7*    Liver Function Tests: Recent Labs  Lab 12/15/17 1748  AST 25  ALT 26  ALKPHOS 72  BILITOT 1.0  PROT 6.2*  ALBUMIN 3.6   No results for input(s): LIPASE, AMYLASE in the last 168 hours. No results for input(s): AMMONIA in the last 168 hours.  CBC: Recent Labs  Lab 12/15/17 1748 12/16/17 1005  WBC 5.2 4.3  NEUTROABS 3.5  --   HGB 6.6* 8.2*  HCT 20.4* 24.2*  MCV 99.2 96.9  PLT 117* 96*    Cardiac Enzymes: No results for input(s): CKTOTAL, CKMB,  CKMBINDEX, TROPONINI in the last 168 hours.  BNP: Invalid input(s): POCBNP  CBG: Recent Labs  Lab 12/15/17 2314 12/16/17 0812 12/16/17 1129  GLUCAP 130* 197* 204*    Microbiology: Results for orders placed or performed during the hospital encounter of 12/15/17  Culture, blood (Routine x 2)     Status: None (Preliminary result)   Collection Time: 12/15/17  5:48 PM  Result Value Ref Range Status   Specimen Description BLOOD RIGHT ANTECUBITAL  Final   Special Requests   Final    BOTTLES DRAWN AEROBIC AND ANAEROBIC Blood Culture results may not be optimal due to an excessive volume of blood received in culture bottles   Culture   Final    NO GROWTH < 12 HOURS Performed at [facility], [address] [place], [place] [address]    Report Status PENDING  Incomplete    Coagulation Studies: Recent Labs    12/15/17 1748  LABPROT 14.0  INR 1.09    Urinalysis: Recent Labs    12/15/17 1748  COLORURINE AMBER*  LABSPEC 1.019  PHURINE 5.0  GLUCOSEU NEGATIVE  HGBUR SMALL*  BILIRUBINUR NEGATIVE  KETONESUR NEGATIVE  PROTEINUR 100*  NITRITE NEGATIVE  LEUKOCYTESUR TRACE*      Imaging: Dg Chest Port 1 View  Result Date: 12/15/2017 CLINICAL DATA:  Altered mental status with cough. EXAM: PORTABLE CHEST 1  VIEW COMPARISON:  01/15/2017 and PET-CT 11/30/2016 FINDINGS: Lungs are adequately inflated and demonstrate hazy prominence of the perihilar markings likely interstitial edema. No effusion. Cardiomediastinal silhouette and remainder the exam is unchanged. IMPRESSION: Findings suggesting mild interstitial edema. Electronically Signed   By: Marin Olp M.D.   On: 12/15/2017 21:06     Medications:   . cefTRIAXone (ROCEPHIN)  IV    . [START ON 12/17/2017] vancomycin     . amLODipine  10 mg Oral Daily  . calcium acetate  2,001 mg Oral TID WC   And  . calcium acetate  1,334 mg Oral With snacks  . Difluprednate  1 drop Left Eye BID  . ferric citrate  420 mg Oral  TID WC  . gabapentin  100 mg Oral BID  . insulin aspart  0-5 Units Subcutaneous QHS  . insulin aspart  0-9 Units Subcutaneous TID WC  . insulin detemir  7 Units Subcutaneous Q2200  . metoCLOPramide  5 mg Oral TID AC  . multivitamin  1 tablet Oral QHS  . pravastatin  20 mg Oral q1800  . sodium zirconium cyclosilicate  10 g Oral Daily   docusate sodium, fluticasone, nystatin cream, Polyvinyl Alcohol-Povidone PF, silver sulfADIAZINE, sodium chloride  Assessment/ Plan:  72 y.o. African American male with end stage renal disease on hemodialysis, CLL, diabetes mellitus type II, history of bladder cancer, TIA  [facility] MWF [facility] AVF  1.  ESRD on HD MWF.  Patient last had dialysis on Friday.  No urgent indication for dialysis at the moment.  We will plan for hemodialysis tomorrow using his left upper extremity AV fistula.  2.  Anemia of chronic kidney disease.  Hemoglobin was 6.6.  Patient did receive blood transfusion.  Patient has received the okay to receive Epogen as an outpatient through hematology.  3.  Secondary hyperparathyroidism.  Maintain the patient on calcium acetate 3 tablets p.o. 3 times daily with meals.  4.  Hyperkalemia.  Patient started on sodium zirconium here to treat his mild hyperkalemia until he has dialysis next.  5.  Fever.  Evaluation and management per hospitalist.  Blood culture and urine cultures thus far negative.  Chest x-ray did not show obvious pneumonia.  Consider infectious disease consultation.     LOS: 1 Mackensie Pilson 9/15/201912:44 PM

## 2017-12-16 NOTE — Evaluation (Signed)
Physical Therapy Evaluation Patient Details Name: Mark [name]. MRN: [id number] DOB: [birth year] Today's Date: 12/16/2017   History of Present Illness  Patient admitted with UTI, cough, AMS, anemia.   Clinical Impression  Patient received sitting EOB. Patient reports he is weak and would like to walk, but wife has to bring his shoes for walking. Willing to try a little bit. 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 Patient on 4 lpm O2 currently. He reports he  Is usually on 2-3 lpm at home. Patient ambulated 5 feet and needed to return to bed due to his sock falling off. Patient O2 sats at 80% after this amount of activity. With pursed lip breathing patient able to recover to 91% while seated on EOB. Patient will benefit from skilled PT to address his weakness, and decreased activity tolerance.       Follow Up Recommendations Home health PT    Equipment Recommendations  None recommended by PT    Recommendations for Other Services       Precautions / Restrictions Precautions Precautions: Fall Restrictions Weight Bearing Restrictions: No      Mobility  Bed Mobility Overal bed mobility: Needs Assistance;Modified Independent             General bed mobility comments: patient received sitting at EOB having just gotten up to [facility] with RN.   Transfers Overall transfer level: Modified independent Equipment used: Rolling walker (2 wheeled)             General transfer comment: sba for safety  Ambulation/Gait Ambulation/Gait assistance: Modified independent (Device/Increase time);Min guard Gait Distance (Feet): 5 Feet Assistive device: Rolling walker (2 wheeled) Gait Pattern/deviations: Decreased step length - right;Shuffle     General Gait Details: patient has drop foot on the right which limits his ability to walk without proper footwear. Patient requires cga for safety with mobility.   Stairs             Wheelchair Mobility    Modified Rankin (Stroke Patients Only)       Balance Overall balance assessment: Mild deficits observed, not formally tested                                           Pertinent Vitals/Pain Pain Assessment: No/denies pain    Home Living Family/patient expects to be discharged to:: Private residence Living Arrangements: Spouse/significant other Available Help at Discharge: Family Type of Home: House Home Access: [place]: One [place]: Environmental consultant - 2 wheels;Walker - 4 wheels;Wheelchair - manual;Transport chair;Bedside commode;Shower seat;Grab bars - tub/shower;Grab bars - toilet;Hand held shower head;Cane - quad;Cane - single point      Prior Function Level of Independence: Independent with assistive device(s)         Comments: Patient able to ambulate with rw at home. Has wheelchair when needed. Patient was to begin HHPT this week. Has had HHPT in the past. Lives with wife who assists as needed.     Hand Dominance   Dominant Hand: Right    Extremity/Trunk Assessment   Upper Extremity Assessment Upper Extremity Assessment: Overall WFL for tasks assessed    Lower Extremity Assessment Lower Extremity Assessment: Overall WFL for tasks assessed       Communication   Communication: No  difficulties  Cognition Arousal/Alertness: Awake/alert Behavior During Therapy: WFL for tasks assessed/performed Overall Cognitive Status: Within Functional Limits for tasks assessed                                        General Comments      Exercises Other Exercises Other Exercises: seated LAQ x 15 reps each, seated marching x 15 reps each   Assessment/Plan    PT Assessment Patient needs continued PT services  PT Problem List Decreased strength;Decreased mobility;Decreased activity tolerance       PT Treatment Interventions Therapeutic activities;Gait  training;Therapeutic exercise;Functional mobility training;Patient/family education    PT Goals (Current goals can be found in the Care Plan section)  Acute Rehab PT Goals Patient Stated Goal: to return home, get stronger PT Goal Formulation: With patient Time For Goal Achievement: 12/30/17 Potential to Achieve Goals: Fair    Frequency Min 2X/week   Barriers to discharge        Co-evaluation               AM-PAC PT "6 Clicks" Daily Activity  Outcome Measure Difficulty turning over in bed (including adjusting bedclothes, sheets and blankets)?: Unable Difficulty moving from lying on back to sitting on the side of the bed? : Unable Difficulty sitting down on and standing up from a chair with arms (e.g., wheelchair, bedside commode, etc,.)?: Unable Help needed moving to and from a bed to chair (including a wheelchair)?: A Little Help needed walking in hospital room?: A Little Help needed climbing 3-5 steps with a railing? : A Lot 6 Click Score: 11    End of Session Equipment Utilized During Treatment: Gait belt Activity Tolerance: Patient tolerated treatment well;Other (comment) Patient left: with bed alarm set   PT Visit Diagnosis: Other abnormalities of gait and mobility (R26.89);Muscle weakness (generalized) (M62.81)    Time: 1500-1520 PT Time Calculation (min) (ACUTE ONLY): 20 min   Charges:   PT Evaluation $PT Eval Moderate Complexity: 1 Mod PT Treatments $Therapeutic Exercise: 8-22 mins        Jhoel Stieg, PT, GCS 12/16/17,3:32 PM

## 2017-12-16 NOTE — Plan of Care (Signed)
  Problem: Education: Goal: Knowledge of General Education information will improve Description: Including pain rating scale, medication(s)/side effects and non-pharmacologic comfort measures Outcome: Completed/Met   Problem: Pain Managment: Goal: General experience of comfort will improve Outcome: Completed/Met   Problem: Safety: Goal: Ability to remain free from injury will improve Outcome: Completed/Met   Problem: Skin Integrity: Goal: Risk for impaired skin integrity will decrease Outcome: Completed/Met   

## 2017-12-16 NOTE — Progress Notes (Signed)
Patient has eye drops that [facility] does not carry. The family brings the medication yet they did not allow [place] to obtain medication to sent to pharmacy for verification of dose since they indicate the medication has been lost before and it cost over 200.00 dollars. The daughter has take the medication home tonight and indicate they will bring it in the morning.

## 2017-12-16 NOTE — Progress Notes (Signed)
Family brought the Difluprednate eye drops to be administered, but does not want to leave the drops at the hospital and do not want to allow them to be sent to the pharmacy for verification. The family states that the medication is expensive, costing $200 per bottle. They are afraid that the eye drops will get lost, as this has happened in the past, with the most recent being last week and [facility] hospital. The patient's daughter is agreeable to bring the eye drops twice daily while he is here so that he can continue to receive the medication. Pharmacy was notified of this.

## 2017-12-17 ENCOUNTER — Telehealth: Payer: Self-pay | Admitting: *Deleted

## 2017-12-17 DIAGNOSIS — Z992 Dependence on renal dialysis: Secondary | ICD-10-CM

## 2017-12-17 DIAGNOSIS — N63 Unspecified lump in unspecified breast: Secondary | ICD-10-CM

## 2017-12-17 DIAGNOSIS — I5020 Unspecified systolic (congestive) heart failure: Secondary | ICD-10-CM

## 2017-12-17 DIAGNOSIS — Z8052 Family history of malignant neoplasm of bladder: Secondary | ICD-10-CM

## 2017-12-17 DIAGNOSIS — J9610 Chronic respiratory failure, unspecified whether with hypoxia or hypercapnia: Secondary | ICD-10-CM

## 2017-12-17 DIAGNOSIS — Z8042 Family history of malignant neoplasm of prostate: Secondary | ICD-10-CM

## 2017-12-17 DIAGNOSIS — E119 Type 2 diabetes mellitus without complications: Secondary | ICD-10-CM

## 2017-12-17 DIAGNOSIS — C9110 Chronic lymphocytic leukemia of B-cell type not having achieved remission: Secondary | ICD-10-CM

## 2017-12-17 DIAGNOSIS — Z8051 Family history of malignant neoplasm of kidney: Secondary | ICD-10-CM

## 2017-12-17 DIAGNOSIS — Z9981 Dependence on supplemental oxygen: Secondary | ICD-10-CM

## 2017-12-17 DIAGNOSIS — Z87891 Personal history of nicotine dependence: Secondary | ICD-10-CM

## 2017-12-17 DIAGNOSIS — N186 End stage renal disease: Secondary | ICD-10-CM

## 2017-12-17 LAB — GLUCOSE, CAPILLARY
GLUCOSE-CAPILLARY: 221 mg/dL — AB (ref 70–99)
GLUCOSE-CAPILLARY: 213 mg/dL — AB (ref 70–99)
Glucose-Capillary: 144 mg/dL — ABNORMAL HIGH (ref 70–99)
Glucose-Capillary: 125 mg/dL — ABNORMAL HIGH (ref 70–99)
Glucose-Capillary: 230 mg/dL — ABNORMAL HIGH (ref 70–99)

## 2017-12-17 LAB — CBC
HEMATOCRIT: 18.4 % — AB (ref 40.0–52.0)
Hemoglobin: 6.4 g/dL — ABNORMAL LOW (ref 13.0–18.0)
MCH: 33.4 pg (ref 26.0–34.0)
MCHC: 35 g/dL (ref 32.0–36.0)
MCV: 95.4 fL (ref 80.0–100.0)
PLATELETS: 92 10*3/uL — AB (ref 150–440)
RBC: 1.93 MIL/uL — ABNORMAL LOW (ref 4.40–5.90)
RDW: 17.8 % — AB (ref 11.5–14.5)
WBC: 3.1 10*3/uL — ABNORMAL LOW (ref 3.8–10.6)

## 2017-12-17 LAB — URINE CULTURE

## 2017-12-17 LAB — PREPARE RBC (CROSSMATCH)

## 2017-12-17 MED ORDER — FUROSEMIDE 10 MG/ML IJ SOLN
80.0000 mg | Freq: Once | INTRAMUSCULAR | Status: DC
  Filled 2017-12-17: qty 8

## 2017-12-17 MED ORDER — IPRATROPIUM-ALBUTEROL 0.5-2.5 (3) MG/3ML IN SOLN
3.0000 mL | Freq: Four times a day (QID) | RESPIRATORY_TRACT | Status: DC
  Administered 2017-12-17 – 2017-12-23 (×22): 3 mL via RESPIRATORY_TRACT
  Filled 2017-12-17 (×24): qty 3

## 2017-12-17 MED ORDER — OCUVITE-LUTEIN PO CAPS
1.0000 | ORAL_CAPSULE | Freq: Every day | ORAL | Status: DC
  Administered 2017-12-17 – 2017-12-23 (×6): 1 via ORAL
  Filled 2017-12-17 (×8): qty 1

## 2017-12-17 MED ORDER — ACETAMINOPHEN 325 MG PO TABS
650.0000 mg | ORAL_TABLET | Freq: Once | ORAL | Status: AC
  Administered 2017-12-17: 650 mg via ORAL
  Filled 2017-12-17: qty 2

## 2017-12-17 MED ORDER — ACETAMINOPHEN 325 MG PO TABS
650.0000 mg | ORAL_TABLET | Freq: Four times a day (QID) | ORAL | Status: DC | PRN
  Administered 2017-12-21: 650 mg via ORAL
  Filled 2017-12-17 (×2): qty 2

## 2017-12-17 MED ORDER — NEPRO/CARBSTEADY PO LIQD
237.0000 mL | Freq: Two times a day (BID) | ORAL | Status: DC
  Administered 2017-12-17 – 2017-12-23 (×11): 237 mL via ORAL

## 2017-12-17 MED ORDER — EPOETIN ALFA 10000 UNIT/ML IJ SOLN
10000.0000 [IU] | INTRAMUSCULAR | Status: DC
  Administered 2017-12-17 – 2017-12-21 (×3): 10000 [IU] via INTRAVENOUS

## 2017-12-17 MED ORDER — SODIUM CHLORIDE 0.9% IV SOLUTION
Freq: Once | INTRAVENOUS | Status: AC
  Administered 2017-12-17: 20:00:00 via INTRAVENOUS

## 2017-12-17 MED ORDER — VITAMIN C 500 MG PO TABS
250.0000 mg | ORAL_TABLET | Freq: Two times a day (BID) | ORAL | Status: DC
  Administered 2017-12-17 – 2017-12-23 (×11): 250 mg via ORAL
  Filled 2017-12-17: qty 1
  Filled 2017-12-17 (×3): qty 0.5
  Filled 2017-12-17: qty 1
  Filled 2017-12-17 (×2): qty 0.5
  Filled 2017-12-17 (×2): qty 1
  Filled 2017-12-17: qty 0.5
  Filled 2017-12-17 (×2): qty 1

## 2017-12-17 NOTE — Plan of Care (Signed)

## 2017-12-17 NOTE — Progress Notes (Signed)
Recently diagnosed breast cancer,recurrent anemia,ikley progressing CLL,ESRD on HD,worsenign Sob with increase in 02 requirement,d/w DR.Brahmanday,had extensive work up done recently at [facility] ,recommends palliative care.pt agrees for that.consult placed. Time spent on this discussion with oncologist ,patient is 30 min

## 2017-12-17 NOTE — Progress Notes (Signed)
Hemoglobin was 6. 4 when CBC was checked at dialysis. The patient was transfer to the unit after dialysis. RN called dialysis nurse to ask if nay blood was given but they indicated that they did not give any blood. Dr. Vianne Bulls was notified of hemoglobin level and indicated that there was possibility to transfuse the patient either today or tomorrow. The patient and family refused blood drawn. Night time hospitalized has notified and is still okay to provided blood administration.

## 2017-12-17 NOTE — Progress Notes (Signed)
HD tx start    12/17/17 1020  Vital Signs  Pulse Rate 85  Pulse Rate Source Monitor  Resp 16  BP 128/65  BP Location Right Leg  BP Method Automatic  Patient Position (if appropriate) Lying  Oxygen Therapy  SpO2 91 %  O2 Device Nasal Cannula  O2 Flow Rate (L/min) 4 L/min  During Hemodialysis Assessment  Blood Flow Rate (mL/min) 400 mL/min  Arterial Pressure (mmHg) -140 mmHg  Venous Pressure (mmHg) 160 mmHg  Transmembrane Pressure (mmHg) 80 mmHg  Ultrafiltration Rate (mL/min) 710 mL/min  Dialysate Flow Rate (mL/min) 800 ml/min  Conductivity: Machine  14.1  HD Safety Checks Performed Yes  Dialysis Fluid Bolus Normal Saline  Bolus Amount (mL) 250 mL  Intra-Hemodialysis Comments Tx initiated

## 2017-12-17 NOTE — Progress Notes (Signed)
Hemoglobin again dropped to 6.4 from 8.2.pt hb keeps dropping, recurring transfusion.,reviewd oncology note from [facility].needs one unit PRBC transfusion,can get transfusion at night shift.

## 2017-12-17 NOTE — Progress Notes (Signed)
[facility] NAME: Mark [name]    MR#:  [id number]  DATE OF BIRTH:  [birth year]  SUBJECTIVE: No further fever, no overnight events.  CHIEF COMPLAINT:   Chief Complaint  Patient presents with  . Altered Mental Status  He feels better today.  Low-grade temp  99.6 Fahrenheit.  REVIEW OF SYSTEMS:   Review of Systems  Constitutional: Negative for chills, fever, malaise/fatigue and weight loss.  HENT: Negative for hearing loss.   Eyes: Negative for blurred vision, double vision and photophobia.  Respiratory: Positive for cough. Negative for hemoptysis and shortness of breath.   Cardiovascular: Negative for palpitations, orthopnea and leg swelling.  Gastrointestinal: Negative for abdominal pain, diarrhea and vomiting.  Genitourinary: Negative for dysuria and urgency.  Musculoskeletal: Negative for myalgias and neck pain.  Skin: Negative for rash.  Neurological: Negative for dizziness, focal weakness, seizures, weakness and headaches.  Psychiatric/Behavioral: Negative for memory loss. The patient does not have insomnia.     DRUG ALLERGIES:   Allergies  Allergen Reactions  . Heparin Nausea Only  . Ibuprofen     DUE TO DIALYSIS  . Multivitamin [Centrum]     DUE TO DIALYSIS  . Daypro [Oxaprozin] Swelling and Rash    Other reaction(s): Other (See Comments)  . Tape Rash    VITALS:  Blood pressure (!) 105/53, pulse 84, temperature 98.7 F (37.1 C), temperature source Oral, resp. rate 20, height 5\' 11"  (1.803 m), weight 86.2 kg, SpO2 90 %.  PHYSICAL EXAMINATION:  GENERAL:  72 y.o.-year-old patient lying in the bed with no acute distress.  EYES: Pupils equal, round, reactive to light and accommodation. No scleral icterus. Extraocular muscles intact.  HEENT: Head atraumatic, normocephalic. Oropharynx and nasopharynx clear.  NECK:  Supple, no jugular venous distention. No thyroid enlargement, no tenderness.  LUNGS: Mostly  clear to auscultation.  CARDIOVASCULAR: S1, S2 normal. No murmurs, rubs, or gallops.  ABDOMEN: Soft, nontender, nondistended. Bowel sounds present. No organomegaly or mass.  EXTREMITIES: Trace pedal edema.  Cyanosis, or clubbing.  NEUROLOGIC: Cranial nerves II through XII are intact. Muscle strength 5/5 in all extremities. Sensation intact. Gait not checked.  PSYCHIATRIC: The patient is alert and oriented x 3.  SKIN: No obvious rash, lesion, or ulcer.    LABORATORY PANEL:   CBC Recent Labs  Lab 12/16/17 1005  WBC 4.3  HGB 8.2*  HCT 24.2*  PLT 96*   ------------------------------------------------------------------------------------------------------------------  Chemistries  Recent Labs  Lab 12/15/17 1748  NA 139  K 5.3*  CL 101  CO2 27  GLUCOSE 130*  BUN 39*  CREATININE 6.06*  CALCIUM 7.7*  AST 25  ALT 26  ALKPHOS 72  BILITOT 1.0   ------------------------------------------------------------------------------------------------------------------  Cardiac Enzymes No results for input(s): TROPONINI in the last 168 hours. ------------------------------------------------------------------------------------------------------------------  RADIOLOGY:  Dg Chest Port 1 View  Result Date: 12/15/2017 CLINICAL DATA:  Altered mental status with cough. EXAM: PORTABLE CHEST 1 VIEW COMPARISON:  01/15/2017 and PET-CT 11/30/2016 FINDINGS: Lungs are adequately inflated and demonstrate hazy prominence of the perihilar markings likely interstitial edema. No effusion. Cardiomediastinal silhouette and remainder the exam is unchanged. IMPRESSION: Findings suggesting mild interstitial edema. Electronically Signed   By: Mark [name] M.D.   On: 12/15/2017 21:06    EKG:   Orders placed or performed during the hospital encounter of 12/15/17  . EKG 12-Lead  . EKG 12-Lead    ASSESSMENT AND PLAN:   #1. clinical sepsis with fever on admission:  On Rocephin, vancomycin,  blood cultures,  urine cultures are negative, chest x-ray did not show any pneumonia.  No further fever.  No leukocytosis.   2.  Acute on chronic anemia: History of bladder cancer: Symptomatic anemia, hemoglobin was 6.7 on admission status post 1 unit of packed RBC transfusion, hemoglobin improved to 8.2, hematocrit 24.2.  Check CBC again with hemodialysis today as per patient's request.  3.  ESRD with hyperkalemia: S/ppost Kayexalate.  Appreciate nephrology consult. #4 constipation: Patient says he takes laxatives every day.  Lactulose is ordered. 5.  GERD: Continue PPI 6.  Diabetes mellitus type 2: Controlled, adjusted the Levemir.   Patient is on Amaryl, Levemir at home, started on Levemir.,  Sliding scale insulin coverage while in the hospital. 7.  Left eye infection: Patient does use special eyedrops which we do not have it here.  Asked the family to get his Durezol that he takes for left eye infection. 8.  Chronic respiratory failure,, on home oxygen 4 L .  More than 50% of  time spent in counseling, coordination of care .All the records are reviewed and case discussed with Care Management/Social Workerr. Management plans discussed with the patient, family and they are in agreement.  CODE STATUS: full  TOTAL TIME TAKING CARE OF THIS PATIENT: 39minutes.   POSSIBLE D/C IN 1-2DAYS, DEPENDING ON CLINICAL CONDITION.   Mark [name] M.D on 12/17/2017 at 9:40 AM  Between 7am to 6pm - Pager - [phone]  After 6pm go to [email or web address] - password [email or web address] [facility]  Office  [phone]  CC: Primary care physician; Mark Billet, MD   Note: This dictation was prepared with Dragon dictation along with smaller phrase technology. Any transcriptional errors that result from this process are unintentional.

## 2017-12-17 NOTE — Progress Notes (Signed)
Post HD assessment . Pt tolerated tx well without c/o. Pt experienced a slow continuous leak at his venous access site during HD tx, MD aware. Pt stated that this has happened during some of his out-patient HD treatments. Net UF 2007, goal met.    12/17/17 1405  Vital Signs  Temp 98.5 F (36.9 C)  Temp Source Oral  Pulse Rate 95  Pulse Rate Source Monitor  Resp 13  BP 139/60  BP Location Right Leg  BP Method Automatic  Patient Position (if appropriate) Lying  Oxygen Therapy  SpO2 98 %  O2 Device Nasal Cannula  O2 Flow Rate (L/min) 4 L/min  Dialysis Weight  Weight 89.5 kg  Type of Weight Post-Dialysis  Post-Hemodialysis Assessment  Rinseback Volume (mL) 250 mL  KECN 78.8 V  Dialyzer Clearance Lightly streaked  Duration of HD Treatment -hour(s) 3.5 hour(s)  Hemodialysis Intake (mL) 700 mL  UF Total -Machine (mL) 2707 mL  Net UF (mL) 2007 mL  Tolerated HD Treatment Yes  AVG/AVF Arterial Site Held (minutes) 15 minutes  AVG/AVF Venous Site Held (minutes) 15 minutes (prolonged bleeding, slow leaking during HD tx,MD aware )  Education / Care Plan  Dialysis Education Provided Yes  Documented Education in Care Plan Yes

## 2017-12-17 NOTE — Telephone Encounter (Signed)
[name] called to report that patient is in the hospital and she would like someone to call her back [phone]

## 2017-12-17 NOTE — Care Management (Signed)
Patient admitted with sepsis.  Patient lives at home with wife.  PCP [employer].  Patient has chronic o2 through [facility]. PT has assessed patient and recommends home health PT.  Patient has Rw, Shower seat, cane, and WC in the home. Patient has previously been open with [facility] home health.  [facility] has currently orders from recent discharge from [facility], however case was not opened prior to being admitted at [facility]. Joelene Millin with [facility] notified of admission.  Elvera Bicker dialysis liaison notified of admission. Will require resumption orders at discharge. Patient followed by Outpatient Palliative.  Santiago Glad with hospital aware of admission.

## 2017-12-17 NOTE — Progress Notes (Signed)
Pre HD assessment    12/17/17 0959  Neurological  Level of Consciousness Alert  Orientation Level Oriented X4  Respiratory  Respiratory Pattern Regular;Unlabored  Chest Assessment Chest expansion symmetrical  Cardiac  Pulse Irregular  ECG Monitor Yes  Cardiac Rhythm NSR;Atrial fibrillation  Ectopy Multifocal PVC's  Ectopy Frequency Frequent  Vascular  R Radial Pulse +2  L Radial Pulse +2  Edema Generalized;Right lower extremity;Left lower extremity  Integumentary  Integumentary (WDL) X  Skin Color Appropriate for ethnicity  Musculoskeletal  Musculoskeletal (WDL) X  Generalized Weakness Yes  Assistive Device None  GU Assessment  Genitourinary (WDL) X  Genitourinary Symptoms  (HD)  Psychosocial  Psychosocial (WDL) WDL  Patient Behaviors Cooperative;Calm  Needs Expressed Denies  Emotional support given Given to patient

## 2017-12-17 NOTE — Progress Notes (Signed)
HD tx end    12/17/17 1355  Vital Signs  Pulse Rate 81  Pulse Rate Source Monitor  Resp 19  BP (!) 153/118  BP Location Right Leg  BP Method Automatic  Patient Position (if appropriate) Lying  Oxygen Therapy  SpO2 96 %  O2 Device Nasal Cannula  O2 Flow Rate (L/min) 4 L/min  During Hemodialysis Assessment  Dialysis Fluid Bolus Normal Saline  Bolus Amount (mL) 250 mL  Intra-Hemodialysis Comments Tx completed

## 2017-12-17 NOTE — Telephone Encounter (Signed)
MD made aware. Returned phone call Mark [name]. She states that Dr. B already talked to her personally on the phone.

## 2017-12-17 NOTE — Progress Notes (Signed)
PT Cancellation Note  Pt currently off floor for dialysis. Will re-attempt PT at a later date/time.   Ernie Avena, SPT 12/17/2017

## 2017-12-17 NOTE — Progress Notes (Signed)
[facility]  ROUNDING NOTE   Subjective:   Seen and examined on hemodialysis. Tolerating treatment well. UF of 2 liters.     HEMODIALYSIS FLOWSHEET:  Blood Flow Rate (mL/min): 400 mL/min Arterial Pressure (mmHg): -150 mmHg Venous Pressure (mmHg): 160 mmHg Transmembrane Pressure (mmHg): 80 mmHg Ultrafiltration Rate (mL/min): 720 mL/min Dialysate Flow Rate (mL/min): 800 ml/min Conductivity: Machine : 13.9 Conductivity: Machine : 13.9 Dialysis Fluid Bolus: Normal Saline Bolus Amount (mL): 250 mL     Objective:  Vital signs in last 24 hours:  Temp:  [98.7 F (37.1 C)-99.1 F (37.3 C)] 98.7 F (37.1 C) (09/16 0958) Pulse Rate:  [70-89] 83 (09/16 1130) Resp:  [12-20] 12 (09/16 1130) BP: (94-138)/(53-80) 120/63 (09/16 1130) SpO2:  [90 %-97 %] 97 % (09/16 1130) Weight:  [91.3 kg] 91.3 kg (09/16 0958)  Weight change:  Filed Weights   12/15/17 1731 12/17/17 0958  Weight: 86.2 kg 91.3 kg    Intake/Output: I/O last 3 completed shifts: In: 730 [P.O.:240; Blood:330; IV Piggyback:160] Out: 73 [Urine:50]   Intake/Output this shift:  Total I/O In: 240 [P.O.:240] Out: 25 [Urine:25]  Physical Exam: General: No acute distress  Head: Normocephalic, atraumatic. Moist oral mucosal membranes  Eyes: Anicteric  Neck: Supple, trachea midline  Lungs:  Clear to auscultation, normal effort  Heart: S1S2 no rubs  Abdomen:  Soft, nontender, bowel sounds present  Extremities: No peripheral edema.  Neurologic: Awake, alert, following commands  Skin: No lesions  Access: LUE AVF    Basic Metabolic Panel: Recent Labs  Lab 12/15/17 1748  NA 139  K 5.3*  CL 101  CO2 27  GLUCOSE 130*  BUN 39*  CREATININE 6.06*  CALCIUM 7.7*    Liver Function Tests: Recent Labs  Lab 12/15/17 1748  AST 25  ALT 26  ALKPHOS 72  BILITOT 1.0  PROT 6.2*  ALBUMIN 3.6   No results for input(s): LIPASE, AMYLASE in the last 168 hours. No results for input(s): AMMONIA in the last  168 hours.  CBC: Recent Labs  Lab 12/15/17 1748 12/16/17 1005  WBC 5.2 4.3  NEUTROABS 3.5  --   HGB 6.6* 8.2*  HCT 20.4* 24.2*  MCV 99.2 96.9  PLT 117* 96*    Cardiac Enzymes: No results for input(s): CKTOTAL, CKMB, CKMBINDEX, TROPONINI in the last 168 hours.  BNP: Invalid input(s): POCBNP  CBG: Recent Labs  Lab 12/16/17 0812 12/16/17 1129 12/16/17 1702 12/16/17 2153 12/17/17 0744  GLUCAP 197* 204* 90 173* 144*    Microbiology: Results for orders placed or performed during the hospital encounter of 12/15/17  Culture, blood (Routine x 2)     Status: None (Preliminary result)   Collection Time: 12/15/17  5:48 PM  Result Value Ref Range Status   Specimen Description BLOOD RIGHT ANTECUBITAL  Final   Special Requests   Final    BOTTLES DRAWN AEROBIC AND ANAEROBIC Blood Culture results may not be optimal due to an excessive volume of blood received in culture bottles   Culture   Final    NO GROWTH 2 DAYS Performed at [facility], [address], [place], [place] [address]    Report Status PENDING  Incomplete  Urine Culture     Status: Abnormal   Collection Time: 12/15/17  5:48 PM  Result Value Ref Range Status   Specimen Description   Final    URINE, RANDOM Performed at [facility], [address] [place], [place] [address]    Special Requests  Final    NONE Performed at [facility], [address] [place], [place] [address]    Culture MULTIPLE SPECIES PRESENT, SUGGEST RECOLLECTION (A)  Final   Report Status 12/17/2017 FINAL  Final    Coagulation Studies: Recent Labs    12/15/17 1748  LABPROT 14.0  INR 1.09    Urinalysis: Recent Labs    12/15/17 1748  COLORURINE AMBER*  LABSPEC 1.019  PHURINE 5.0  GLUCOSEU NEGATIVE  HGBUR SMALL*  BILIRUBINUR NEGATIVE  KETONESUR NEGATIVE  PROTEINUR 100*  NITRITE NEGATIVE  LEUKOCYTESUR TRACE*      Imaging: Dg Chest Port 1 View  Result Date: 12/15/2017 CLINICAL DATA:   Altered mental status with cough. EXAM: PORTABLE CHEST 1 VIEW COMPARISON:  01/15/2017 and PET-CT 11/30/2016 FINDINGS: Lungs are adequately inflated and demonstrate hazy prominence of the perihilar markings likely interstitial edema. No effusion. Cardiomediastinal silhouette and remainder the exam is unchanged. IMPRESSION: Findings suggesting mild interstitial edema. Electronically Signed   By: Marin Olp M.D.   On: 12/15/2017 21:06     Medications:   . cefTRIAXone (ROCEPHIN)  IV 1 g (12/16/17 1730)  . vancomycin     . amLODipine  10 mg Oral Daily  . calcium acetate  2,001 mg Oral TID WC   And  . calcium acetate  1,334 mg Oral With snacks  . Difluprednate  1 drop Left Eye BID  . ferric citrate  420 mg Oral TID WC  . gabapentin  100 mg Oral BID  . insulin aspart  0-5 Units Subcutaneous QHS  . insulin aspart  0-9 Units Subcutaneous TID WC  . insulin detemir  7 Units Subcutaneous Q2200  . ipratropium-albuterol  3 mL Nebulization Q6H  . metoCLOPramide  5 mg Oral TID AC  . multivitamin  1 tablet Oral QHS  . pravastatin  20 mg Oral q1800   docusate sodium, fluticasone, nystatin cream, Polyvinyl Alcohol-Povidone PF, silver sulfADIAZINE, sodium chloride  Assessment/ Plan:  [name]. is a 72 y.o. black male with end stage renal disease on hemodialysis, CLL, diabetes mellitus type II, COPD, history of bladder cancer who is admitted to [facility] on 12/15/2017 for shortness of breath.   [facility] MWF [facility] L AVF 90.5kg  1.  ESRD on HD MWF. Seen and examined on hemodialysis. Tolerating treatment well.   2.  Anemia of chronic kidney disease.  Status post PRBC transfusion. Hemoglobin 8.2 - EPO with HD treatment. IV venofer as outpatient.  - Consult hematology.  3.  Secondary hyperparathyroidism - Continue Auryxia and calcium acetate.   4. Hypertension: blood pressure at goal.  - amlodipine.    LOS: 2 Mark [name] 9/16/201911:35 AM

## 2017-12-17 NOTE — Progress Notes (Signed)
Initial Nutrition Assessment  DOCUMENTATION CODES:   Not applicable  INTERVENTION:   Nepro Shake po BID, each supplement provides 425 kcal and 19 grams protein  Rena-vite daily  Ocuvite daily for wound healing (provides zinc, vitamin A, vitamin C, Vitamin E, copper, and selenium)  Vitamin C 295m po BID  Carbohydrate controlled diet with potassium restriction   NUTRITION DIAGNOSIS:   Increased nutrient needs related to chronic illness(ESRD on HD) as evidenced by increased estimated needs.  GOAL:   Patient will meet greater than or equal to 90% of their needs  MONITOR:   PO intake, Supplement acceptance, Labs, Weight trends, Skin, I & O's  REASON FOR ASSESSMENT:   Malnutrition Screening Tool    ASSESSMENT:   72y/o male with h/o D, ESRD on HD, IBS, B-cell lymphoma, bladder cancer, stroke admitted with UTI and sepsis   Met with pt in room today. Pt reports good appetite and oral intake at baseline. Per chart, pt with 12lb(6%) wt loss over the past 6 weeks; pt reports weight loss is r/t [facility] [place] [facility] [place]. Pt hates the renal diet. Pt is eating 100% of meals here but reports he does not like being on a renal diet. RD discussed with pt the reasons why he is on a renal diet and what this restricts. Pt reports drinking Ensure at home; pt does drink Nepro sometimes after dialysis but he prefers Ensure. RD explained the differences between Nepro and Ensure. Recommend Nepro in setting of hyperkalemia; pt is fine with this but does not like butter pecan flavor. RD will liberalize pt's diet and add 24069mpotassium restriction through [employer]; pt made aware of this. Will not restrict sodium but will not allow salt packs on trays. Pt does not have his dentures here in the hospital; will request chopped meats with meals. Pt reports that he does take a daily renal MVI. Pt noted to have ecchymosis will initiate vitamin C and ocuvite for wound  healing.    Medications reviewed and include: phoslo, epoetin, ferric citrate, insulin, reglan, ceftriaxone, vancomycin  Labs reviewed: K 5.3(H), BUN 39(H), creat 6.06(H), Ca 7.7(L) Wbc- 3.1(L), Hgb 6.4(L), Hct 18.4(L) iPTH- 93(H)- 10/21/2016  NUTRITION - FOCUSED PHYSICAL EXAM:    Most Recent Value  Orbital Region  No depletion  Upper Arm Region  No depletion  Thoracic and Lumbar Region  No depletion  Buccal Region  No depletion  Temple Region  No depletion  Clavicle Bone Region  No depletion  Clavicle and Acromion Bone Region  No depletion  Scapular Bone Region  No depletion  Dorsal Hand  No depletion  Patellar Region  No depletion  Anterior Thigh Region  No depletion  Posterior Calf Region  No depletion  Edema (RD Assessment)  Mild  Hair  Reviewed  Eyes  Reviewed  Mouth  Reviewed  Skin  Reviewed [ecchymosis ]  Nails  Reviewed     Diet Order:   Diet Order            Diet Carb Modified Fluid consistency: Thin; Room service appropriate? Yes; Fluid restriction: 1500 mL Fluid  Diet effective now             EDUCATION NEEDS:   Education needs have been addressed  Skin:  Skin Assessment: Reviewed RN Assessment(unstageable pressure injury toe, ecchymosis )  Last BM:  9/15  Height:   Ht Readings from Last 1 Encounters:  12/15/17 '5\' 11"'  (1.803 m)  Weight:   Wt Readings from Last 1 Encounters:  12/17/17 89.5 kg    Ideal Body Weight:  78.2 kg  BMI:  Body mass index is 27.52 kg/m.  Estimated Nutritional Needs:   Kcal:  2100-2400kcal/day   Protein:  112-130g/day   Fluid:  1.5L/day   Koleen Distance MS, RD, LDN Pager #- [phone] Office#- [phone] After Hours Pager: [phone]

## 2017-12-17 NOTE — Progress Notes (Signed)
Pre HD assessment    12/17/17 0958  Vital Signs  Temp 98.7 F (37.1 C)  Temp Source Oral  Pulse Rate 86  Pulse Rate Source Monitor  Resp 14  BP 130/66  BP Location Right Leg  BP Method Automatic  Patient Position (if appropriate) Lying  Oxygen Therapy  SpO2 93 %  O2 Device Nasal Cannula  O2 Flow Rate (L/min) 4 L/min  Pain Assessment  Pain Scale 0-10  Pain Score 0  Dialysis Weight  Weight 91.3 kg  Type of Weight Pre-Dialysis  Time-Out for Hemodialysis  What Procedure? HD  Pt Identifiers(min of two) First/Last Name;MRN/Account#  Correct Site? Yes  Correct Side? Yes  Correct Procedure? Yes  Consents Verified? Yes  Rad Studies Available? N/A  Safety Precautions Reviewed? Yes  Engineer, civil (consulting) Number  ([id number])  Station Number 4  UF/Alarm Test Passed  Conductivity: Meter 13.8  Conductivity: Machine  14.1  pH 7.6  Reverse Osmosis main  Normal Saline Lot Number [id number]  Dialyzer Lot Number [id number]  Disposable Set Lot Number [id number]  Machine Temperature 98.6 F (37 C)  Musician and Audible Yes  Blood Lines Intact and Secured Yes  Pre Treatment Patient Checks  Vascular access used during treatment Fistula  Hepatitis B Surface Antigen Results Negative  Date Hepatitis B Surface Antigen Drawn 11/26/17  Hepatitis B Surface Antibody  (<10)  Date Hepatitis B Surface Antibody Drawn 04/30/17  Hemodialysis Consent Verified Yes  Hemodialysis Standing Orders Initiated Yes  ECG (Telemetry) Monitor On Yes  Prime Ordered Normal Saline  Length of  DialysisTreatment -hour(s) 3.5 Hour(s)  Dialyzer Elisio 17H NR  Dialysate 2K, 2.5 Ca  Dialysis Anticoagulant None  Dialysate Flow Ordered 800  Blood Flow Rate Ordered 400 mL/min  Ultrafiltration Goal 2 Liters  Pre Treatment Labs Phosphorus (PTH, Vancomycin random)  Dialysis Blood Pressure Support Ordered Normal Saline  Education / Care Plan  Dialysis Education Provided Yes  Documented Education in Care Plan Yes

## 2017-12-17 NOTE — Progress Notes (Signed)
Post HD assessment    12/17/17 1404  Neurological  Level of Consciousness Alert  Orientation Level Oriented X4  Respiratory  Respiratory Pattern Regular;Unlabored  Chest Assessment Chest expansion symmetrical  Cardiac  Pulse Irregular  ECG Monitor Yes  Cardiac Rhythm NSR;Atrial flutter  Ectopy Multifocal PVC's  Ectopy Frequency Frequent  Vascular  R Radial Pulse +2  L Radial Pulse +2  Edema Generalized;Right lower extremity;Left lower extremity  Integumentary  Integumentary (WDL) X  Skin Color Appropriate for ethnicity  Musculoskeletal  Musculoskeletal (WDL) X  Generalized Weakness Yes  Assistive Device None  GU Assessment  Genitourinary (WDL) X  Genitourinary Symptoms  (HD)  Psychosocial  Psychosocial (WDL) WDL  Patient Behaviors Cooperative;Calm  Needs Expressed Denies  Emotional support given Given to patient

## 2017-12-18 ENCOUNTER — Inpatient Hospital Stay: Payer: Medicare HMO

## 2017-12-18 DIAGNOSIS — R05 Cough: Secondary | ICD-10-CM

## 2017-12-18 DIAGNOSIS — Z7189 Other specified counseling: Secondary | ICD-10-CM

## 2017-12-18 DIAGNOSIS — Z515 Encounter for palliative care: Secondary | ICD-10-CM

## 2017-12-18 LAB — GLUCOSE, CAPILLARY
Glucose-Capillary: 189 mg/dL — ABNORMAL HIGH (ref 70–99)
Glucose-Capillary: 268 mg/dL — ABNORMAL HIGH (ref 70–99)
Glucose-Capillary: 290 mg/dL — ABNORMAL HIGH (ref 70–99)
Glucose-Capillary: 211 mg/dL — ABNORMAL HIGH (ref 70–99)

## 2017-12-18 LAB — BPAM RBC
BLOOD PRODUCT EXPIRATION DATE: 201910032359
Blood Product Expiration Date: 201909242359
ISSUE DATE / TIME: 201909150242
ISSUE DATE / TIME: 201909162041
Unit Type and Rh: 7300
Unit Type and Rh: 7300

## 2017-12-18 LAB — TYPE AND SCREEN
ABO/RH(D): B POS
ANTIBODY SCREEN: NEGATIVE
UNIT DIVISION: 0
Unit division: 0

## 2017-12-18 LAB — PARATHYROID HORMONE, INTACT (NO CA): PTH: 201 pg/mL — ABNORMAL HIGH (ref 15–65)

## 2017-12-18 MED ORDER — INSULIN DETEMIR 100 UNIT/ML ~~LOC~~ SOLN
12.0000 [IU] | Freq: Every day | SUBCUTANEOUS | Status: DC
  Administered 2017-12-18 – 2017-12-19 (×2): 12 [IU] via SUBCUTANEOUS
  Filled 2017-12-18 (×3): qty 0.12

## 2017-12-18 MED ORDER — SODIUM CHLORIDE 0.9% IV SOLUTION: Freq: Once | INTRAVENOUS | Status: DC

## 2017-12-18 MED ORDER — SODIUM CHLORIDE 0.9 % IV SOLN
INTRAVENOUS | Status: DC | PRN
  Administered 2017-12-18 – 2017-12-19 (×2): 250 mL via INTRAVENOUS

## 2017-12-18 NOTE — Consult Note (Signed)
[facility] CONSULT NOTE  Patient Care Team: Albina Billet, MD as PCP - General (Internal Medicine)  CHIEF COMPLAINTS/PURPOSE OF CONSULTATION:  CLL  HISTORY OF PRESENTING ILLNESS:  Mark [name]. 72 y.o.  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  Patient was recently discharged from [facility] after diagnosed with acute respiratory failure secondary to fluid overload/pneumonia/CHF.  During the hospitalization patient also had a PET scan done for evaluation of CLL that showed improved response on ibrutinib; however showed a new right breast mass which is awaiting further evaluation.  Patient also had a bone marrow biopsy that showed about 70% CLL involvement.  Patient was also treated with antibiotics for an possible pneumonia.  Patient is again currently up to the hospital for generalized weakness/feeling poorly.  Patient is on antibiotics for possible infection; however infectious work-up is negative so far.   With regards to right breast mass-work-up with mammogram/ultrasound is planned as outpatient.   Patient declines blood draws given; hard IV stick.  ROS: Extreme fatigue.  Poor appetite.  No nausea vomiting.  Feels poorly.  Complete 10 point review system is done was negative except mentioned above in HPI MEDICAL HISTORY:  Past Medical History:  Diagnosis Date  . Anemia   . Arthritis   . Bladder cancer ([place])   . Chronic kidney disease   . Diabetes mellitus without complication ([place])   . Dialysis patient [facility])    M,W,F  . Dyspnea    DOE  . GERD (gastroesophageal reflux disease)   . HOH (hard of hearing)   . Hypertension   . IBS (irritable bowel syndrome)   . Lymphoma ([place]) 09/27/2014  . Neuropathy    RIGHT LEG  . Stroke [facility])    TIA    SURGICAL HISTORY: Past Surgical History:  Procedure Laterality Date  . BACK SURGERY  2007  . CATARACT EXTRACTION W/PHACO Left 03/23/2016   Procedure: CATARACT EXTRACTION PHACO AND INTRAOCULAR LENS PLACEMENT (IOC);  Surgeon: Leandrew Koyanagi, MD;  Location: [facility] ORS;  Service: Ophthalmology;  Laterality: Left;  [place] 52.6 AP% 14.7 CDE 7.72 Fluid pack lot # [id number]  . CATARACT EXTRACTION W/PHACO Right 05/30/2016   Procedure: CATARACT EXTRACTION PHACO AND INTRAOCULAR LENS PLACEMENT ([place]);  Surgeon: Leandrew Koyanagi, MD;  Location: [facility] ORS;  Service: Ophthalmology;  Laterality: Right;  [place] 01:08 AP% 13.9 CDE 9.53  note: could not get IV in patient, so procedure done without anesthesia personell present, ok per Dr Wallace Going fluid pack lo t# [id number]  . CIRCUMCISION    . PERIPHERAL VASCULAR CATHETERIZATION Left 08/19/2015   Procedure: A/V Shuntogram/Fistulagram;  Surgeon: Algernon Huxley, MD;  Location: [place] CV LAB;  Service: Cardiovascular;  Laterality: Left;  . PERIPHERAL VASCULAR CATHETERIZATION N/A 08/19/2015   Procedure: A/V Shunt Intervention;  Surgeon: Algernon Huxley, MD;  Location: [place] CV LAB;  Service: Cardiovascular;  Laterality: N/A;    SOCIAL HISTORY: Social History   Socioeconomic History  . Marital status: Married    Spouse name: Not on file  . Number of children: Not on file  . Years of education: Not on file  . Highest education level: Not on file  Occupational History  . Occupation: retired  Scientific laboratory technician  . Financial resource strain: Not on file  . Food insecurity:    Worry: Not on file  Inability: Not on file  . Transportation needs:    Medical: Not on file    Non-medical: Not on file  Tobacco Use  . Smoking status: Former Smoker    Types: Cigarettes    Last attempt to quit: 05/19/2013    Years since quitting: 4.5  . Smokeless tobacco: Never Used  . Tobacco comment: quit  Substance and Sexual Activity  . Alcohol use: No     Alcohol/week: 0.0 standard drinks  . Drug use: No  . Sexual activity: Not on file  Lifestyle  . Physical activity:    Days per week: Not on file    Minutes per session: Not on file  . Stress: Not on file  Relationships  . Social connections:    Talks on phone: Not on file    Gets together: Not on file    Attends religious service: Not on file    Active member of club or organization: Not on file    Attends meetings of clubs or organizations: Not on file    Relationship status: Not on file  . Intimate partner violence:    Fear of current or ex partner: Not on file    Emotionally abused: Not on file    Physically abused: Not on file    Forced sexual activity: Not on file  Other Topics Concern  . Not on file  Social History Narrative  . Not on file    FAMILY HISTORY: Family History  Problem Relation Age of Onset  . Cancer Mother   . Kidney cancer Neg Hx   . Prostate cancer Neg Hx   . Kidney failure Neg Hx   . Bladder Cancer Neg Hx     ALLERGIES:  is allergic to heparin; ibuprofen; multivitamin [centrum]; daypro [oxaprozin]; and tape.  MEDICATIONS:  Current Facility-Administered Medications  Medication Dose Route Frequency Provider Last Rate Last Dose  . acetaminophen (TYLENOL) tablet 650 mg  650 mg Oral Q6H PRN Wieting, Richard, MD      . amLODipine (NORVASC) tablet 10 mg  10 mg Oral Daily Loletha Grayer, MD   10 mg at 12/17/17 1452  . calcium acetate (PHOSLO) capsule 2,001 mg  2,001 mg Oral TID WC Wieting, Richard, MD   2,001 mg at 12/17/17 1718   And  . calcium acetate (PHOSLO) capsule 1,334 mg  1,334 mg Oral With snacks Wieting, Richard, MD      . cefTRIAXone (ROCEPHIN) 1 g in sodium chloride 0.9 % 100 mL IVPB  1 g Intravenous Q24H Loletha Grayer, MD   Stopped at 12/17/17 1844  . Difluprednate 0.05 % EMUL 1 drop  1 drop Left Eye BID Loletha Grayer, MD   1 drop at 12/17/17 2129  . docusate sodium (COLACE) capsule 100 mg  100 mg Oral Daily PRN Loletha Grayer, MD       . epoetin alfa (EPOGEN,PROCRIT) injection 10,000 Units  10,000 Units Intravenous Q M,W,F-HD Lavonia Dana, MD   10,000 Units at 12/17/17 1222  . feeding supplement (NEPRO CARB STEADY) liquid 237 mL  237 mL Oral BID BM [name], MD   237 mL at 12/17/17 1452  . ferric citrate (AURYXIA) tablet 420 mg  420 mg Oral TID WC Loletha Grayer, MD   420 mg at 12/16/17 1811  . fluticasone (FLONASE) 50 MCG/ACT nasal spray 1 spray  1 spray Each Nare Daily PRN Loletha Grayer, MD   1 spray at 12/17/17 1748  . furosemide (LASIX) injection 80 mg  80 mg Intravenous  Once Loletha Grayer, MD      . gabapentin (NEURONTIN) capsule 100 mg  100 mg Oral BID Loletha Grayer, MD   100 mg at 12/17/17 2127  . insulin aspart (novoLOG) injection 0-5 Units  0-5 Units Subcutaneous QHS Loletha Grayer, MD   3 Units at 12/17/17 2127  . insulin aspart (novoLOG) injection 0-9 Units  0-9 Units Subcutaneous TID WC Loletha Grayer, MD   3 Units at 12/17/17 1723  . insulin detemir (LEVEMIR) injection 7 Units  7 Units Subcutaneous Q2200 [name], MD   7 Units at 12/17/17 2128  . ipratropium-albuterol (DUONEB) 0.5-2.5 (3) MG/3ML nebulizer solution 3 mL  3 mL Nebulization Q6H [name], MD   3 mL at 12/18/17 0741  . metoCLOPramide (REGLAN) tablet 5 mg  5 mg Oral TID AC Wieting, Richard, MD   5 mg at 12/17/17 1717  . multivitamin (RENA-VIT) tablet 1 tablet  1 tablet Oral QHS Loletha Grayer, MD   1 tablet at 12/17/17 2127  . multivitamin-lutein (OCUVITE-LUTEIN) capsule 1 capsule  1 capsule Oral Daily [name], MD   1 capsule at 12/17/17 1700  . nystatin cream (MYCOSTATIN) 1 application  1 application Topical BID PRN Wieting, Richard, MD      . Polyvinyl Alcohol-Povidone PF 1.4-0.6 % SOLN 1 drop  1 drop Ophthalmic TID PRN Loletha Grayer, MD      . pravastatin (PRAVACHOL) tablet 20 mg  20 mg Oral q1800 Loletha Grayer, MD   20 mg at 12/17/17 1722  . silver sulfADIAZINE (SILVADENE) 1  % cream 1 application  1 application Topical Daily PRN Wieting, Richard, MD      . sodium chloride (OCEAN) 0.65 % nasal spray 1 spray  1 spray Each Nare PRN [name], MD   1 spray at 12/17/17 1450  . vancomycin (VANCOCIN) IVPB 1000 mg/200 mL premix  1,000 mg Intravenous Q M,W,F-HD Pernell Dupre, RPH   Stopped at 12/17/17 1436  . vitamin C (ASCORBIC ACID) tablet 250 mg  250 mg Oral BID [name], MD   250 mg at 12/17/17 1700      .  PHYSICAL EXAMINATION:  Vitals:   12/18/17 0610 12/18/17 0744  BP: (!) 138/40   Pulse: 75 83  Resp: 18 18  Temp: 99.2 F (37.3 C)   SpO2: 96% 97%   Filed Weights   12/15/17 1731 12/17/17 0958 12/17/17 1405  Weight: 190 lb (86.2 kg) 201 lb 4.5 oz (91.3 kg) 197 lb 5 oz (89.5 kg)    GENERAL: Well-nourished well-developed; Alert, no distress and comfortable.   Seen in dialysis alone.  EYES: Positive for pallor no icterus OROPHARYNX: no thrush or ulceration. NECK: supple, no masses felt LYMPH:  no palpable lymphadenopathy in the cervical, axillary or inguinal regions LUNGS: decreased breath sounds to auscultation at bases and  No wheeze or crackles HEART/CVS: regular rate & rhythm and no murmurs; No lower extremity edema ABDOMEN: abdomen soft, non-tender and normal bowel sounds Musculoskeletal:no cyanosis of digits and no clubbing  PSYCH: alert & oriented x 3 with fluent speech NEURO: no focal motor/sensory deficits SKIN:  no rashes or significant lesions; right breast mass hard up to 3 cm noted.  LABORATORY DATA:  I have reviewed the data as listed Lab Results  Component Value Date   WBC 3.1 (L) 12/17/2017   HGB 6.4 (L) 12/17/2017   HCT 18.4 (L) 12/17/2017   MCV 95.4 12/17/2017   PLT 92 (L) 12/17/2017   Recent Labs  01/17/17 0747 01/23/17 0920 12/15/17 1748  NA 131* 134* 139  K 4.9 3.9 5.3*  CL 91* 95* 101  CO2 _0 GLUCOSE 228* 121* 130*  BUN 62* 47* 39*  CREATININE 7.01* 5.80* 6.06*  CALCIUM 8.9 7.9*  7.7*  GFRNONAA 7* 9* 8*  GFRAA 8* 10* 10*  PROT  --  6.3* 6.2*  ALBUMIN 4.2 3.6 3.6  AST  --  18 25  ALT  --  25 26  ALKPHOS  --  59 72  BILITOT  --  0.7 1.0    RADIOGRAPHIC STUDIES: I have personally reviewed the radiological images as listed and agreed with the findings in the report. Dg Chest Port 1 View  Result Date: 12/15/2017 CLINICAL DATA:  Altered mental status with cough. EXAM: PORTABLE CHEST 1 VIEW COMPARISON:  01/15/2017 and PET-CT 11/30/2016 FINDINGS: Lungs are adequately inflated and demonstrate hazy prominence of the perihilar markings likely interstitial edema. No effusion. Cardiomediastinal silhouette and remainder the exam is unchanged. IMPRESSION: Findings suggesting mild interstitial edema. Electronically Signed   By: Marin Olp M.D.   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  # CLL-most recently on ibrutinib; recent PET scan in September 2019 at [facility] showed improvement of the CLL.  September 2019 bone marrow biopsy showed-no evidence of transformation to 70% involvement of the bone marrow with CLL.   #New diagnosis of right breast mass-question malignancy; on PET scan.  In general would recommend-mammogram followed by biopsy.  However see discussion below  # Generalized weakness/extreme fatigue-multifactorial question sepsis; currently on broad-spectrum antibiotics.  Infectious work-up in progress.  #Multiple medical problems including-chronic CHF/chronic respiratory failure; end-stage renal disease on hemodialysis; controlled diabetes etc.  #Prognosis : patient has had multiple admissions to hospital in the last many months.  This admission would be 6th since October 2018  [prior admissions to [facility]]-mainly for worsening shortness of breath/CHF etc.  Patient and family notes significant decline in the quality of his life over the last many months.  I would recommend palliative care evaluation for further recommendations.  I discussed with the patient and his daughter, [name] at length that in agreement with above plan.  Thank you Dr.Konidena for allowing me to participate in the care of your pleasant patient. Please do not hesitate to contact me with questions or concerns in the interim. Also discussed with Dr.Konidena.   All questions were answered. The patient knows to call the clinic with any problems, questions or concerns.    Cammie Sickle, MD

## 2017-12-18 NOTE — Progress Notes (Signed)
[facility]  ROUNDING NOTE   Subjective:   Hemodialysis treatment yesterday. Tolerated treatment well. UF of 2007  Met with Palliative care today.   Daughter and son-in-law at bedside.    Objective:  Vital signs in last 24 hours:  Temp:  [98.7 F (37.1 C)-100.8 F (38.2 C)] 98.7 F (37.1 C) (09/17 1308) Pulse Rate:  [75-93] 93 (09/17 1427) Resp:  [16-24] 20 (09/17 1308) BP: (94-151)/(40-96) 117/48 (09/17 1427) SpO2:  [82 %-98 %] 96 % (09/17 1427)  Weight change:  Filed Weights   12/15/17 1731 12/17/17 0958 12/17/17 1405  Weight: 86.2 kg 91.3 kg 89.5 kg    Intake/Output: I/O last 3 completed shifts: In: 579.2 [P.O.:360; IV Piggyback:219.2] Out: 2157 [Urine:150; Other:2007]   Intake/Output this shift:  Total I/O In: 480 [P.O.:480] Out: -   Physical Exam: General: No acute distress  Head: Normocephalic, atraumatic. Moist oral mucosal membranes  Eyes: Anicteric  Neck: Supple, trachea midline  Lungs:  Crackles at bases, [place] O2  Heart: S1S2 no rubs  Abdomen:  Soft, nontender, bowel sounds present  Extremities: No peripheral edema.  Neurologic: Awake, alert, following commands  Skin: No lesions  Access: LUE AVF    Basic Metabolic Panel: Recent Labs  Lab 12/15/17 1748  NA 139  K 5.3*  CL 101  CO2 27  GLUCOSE 130*  BUN 39*  CREATININE 6.06*  CALCIUM 7.7*    Liver Function Tests: Recent Labs  Lab 12/15/17 1748  AST 25  ALT 26  ALKPHOS 72  BILITOT 1.0  PROT 6.2*  ALBUMIN 3.6   No results for input(s): LIPASE, AMYLASE in the last 168 hours. No results for input(s): AMMONIA in the last 168 hours.  CBC: Recent Labs  Lab 12/15/17 1748 12/16/17 1005 12/17/17 1108  WBC 5.2 4.3 3.1*  NEUTROABS 3.5  --   --   HGB 6.6* 8.2* 6.4*  HCT 20.4* 24.2* 18.4*  MCV 99.2 96.9 95.4  PLT 117* 96* 92*    Cardiac Enzymes: No results for input(s): CKTOTAL, CKMB, CKMBINDEX, TROPONINI in the last 168 hours.  BNP: Invalid input(s):  POCBNP  CBG: Recent Labs  Lab 12/17/17 1616 12/17/17 1705 12/17/17 2042 12/18/17 0741 12/18/17 1205  GLUCAP 221* 213* 230* 189* 268*    Microbiology: Results for orders placed or performed during the hospital encounter of 12/15/17  Culture, blood (Routine x 2)     Status: None (Preliminary result)   Collection Time: 12/15/17  5:48 PM  Result Value Ref Range Status   Specimen Description BLOOD RIGHT ANTECUBITAL  Final   Special Requests   Final    BOTTLES DRAWN AEROBIC AND ANAEROBIC Blood Culture results may not be optimal due to an excessive volume of blood received in culture bottles   Culture   Final    NO GROWTH 3 DAYS Performed at [facility], [address] [place], [place] [id number]    Report Status PENDING  Incomplete  Urine Culture     Status: Abnormal   Collection Time: 12/15/17  5:48 PM  Result Value Ref Range Status   Specimen Description   Final    URINE, RANDOM Performed at [facility], [address], [place], [place] [address]    Special Requests   Final    NONE Performed at [facility], [address] [place],  [address]    Culture MULTIPLE SPECIES PRESENT, SUGGEST RECOLLECTION (A)  Final   Report Status 12/17/2017 FINAL  Final    Coagulation  Studies: Recent Labs    12/15/17 1748  LABPROT 14.0  INR 1.09    Urinalysis: Recent Labs    12/15/17 1748  COLORURINE AMBER*  LABSPEC 1.019  PHURINE 5.0  GLUCOSEU NEGATIVE  HGBUR SMALL*  BILIRUBINUR NEGATIVE  KETONESUR NEGATIVE  PROTEINUR 100*  NITRITE NEGATIVE  LEUKOCYTESUR TRACE*      Imaging: No results found.   Medications:   . cefTRIAXone (ROCEPHIN)  IV Stopped (12/17/17 1844)  . vancomycin Stopped (12/17/17 1436)   . amLODipine  10 mg Oral Daily  . calcium acetate  2,001 mg Oral TID WC   And  . calcium acetate  1,334 mg Oral With snacks  . Difluprednate  1 drop Left Eye BID  . epoetin (EPOGEN/PROCRIT) injection  10,000 Units Intravenous  Q M,W,F-HD  . feeding supplement (NEPRO CARB STEADY)  237 mL Oral BID BM  . ferric citrate  420 mg Oral TID WC  . furosemide  80 mg Intravenous Once  . gabapentin  100 mg Oral BID  . insulin aspart  0-5 Units Subcutaneous QHS  . insulin aspart  0-9 Units Subcutaneous TID WC  . insulin detemir  12 Units Subcutaneous Q2200  . ipratropium-albuterol  3 mL Nebulization Q6H  . metoCLOPramide  5 mg Oral TID AC  . multivitamin  1 tablet Oral QHS  . multivitamin-lutein  1 capsule Oral Daily  . pravastatin  20 mg Oral q1800  . vitamin C  250 mg Oral BID   acetaminophen, docusate sodium, fluticasone, nystatin cream, Polyvinyl Alcohol-Povidone PF, silver sulfADIAZINE, sodium chloride  Assessment/ Plan:  [name]. is a 72 y.o. black male with end stage renal disease on hemodialysis, CLL, diabetes mellitus type II, COPD, history of bladder cancer who is admitted to [facility] on 12/15/2017 for shortness of breath.   [facility] MWF [facility] L AVF 90.5kg  1.  ESRD on HD MWF. Tolerated hemodialysis well.  - MWF schedule.   2.  Anemia of chronic kidney disease.  Hemoglobin 6.4 - unable to get PRBC transfusion yesterday. - Schedule PRBC transfusion with tomorrow's dialysis.  - EPO with HD treatment. IV venofer as outpatient.  - Appreciate hematology.  3.  Secondary hyperparathyroidism: patient states he will take these phos binders the way he wants.  - Continue Auryxia and calcium acetate. Patient discretion.   4. Hypertension: blood pressure at goal.  - amlodipine.    LOS: 3 Mark [name] 9/17/20192:53 PM

## 2017-12-18 NOTE — Care Management Important Message (Signed)
Copy of signed IM left with patient in room.  

## 2017-12-18 NOTE — Progress Notes (Signed)
PT Cancellation Note  Patient Details Name: Tag Wurtz. MRN: [id number] DOB: 1945-05-15   Cancelled Treatment:    Reason Eval/Treat Not Completed: Medical issues which prohibited therapy. Spoke with nursing; who requests hold for PT at this time due to pt with respiratory distress requiring high flow O2; currently awaiting chest x ray. Re attempt tomorrow as pt status allows.    Larae Grooms, PTA 12/18/2017, 2:48 PM

## 2017-12-18 NOTE — Progress Notes (Signed)
   12/18/17 1000  Clinical Encounter Type  Visited With Patient;Family  Visit Type Initial;Spiritual support  Recommendations Follow-up as requested.  Spiritual Encounters  Spiritual Needs Emotional   Patient began sharing the narrative of his illness when it was time for the patient to receive care. Chaplain offered to return later if needed.

## 2017-12-18 NOTE — Consult Note (Signed)
Consultation Note Date: 12/18/2017   Patient Name: Mark [name].  DOB: [birth year]  MRN: [id number]  Age / Sex: 72 y.o., male  PCP: Albina Billet, MD Referring Physician: [name], MD  Reason for Consultation: Establishing goals of care  HPI/Patient Profile: Mark [name]  is a 72 y.o. male with a known history of end-stage renal disease.  He presented with a temperature of 103 he was very weak.    Clinical Assessment and Goals of Care: Patient is sitting in bed. He has a high flow cannula system in place. His daughter and son in law are at bedside.   We discussed his diagnosis, prognosis, GOC, EOL wishes disposition and options.  A detailed discussion was had today regarding advanced directives.  Concepts specific to code status, artifical feeding and hydration,  IV antibiotics and rehospitalization were discussed.  The difference between an aggressive medical intervention path and a hospice comfort care path was discussed.  Values and goals of care important to patient and family were attempted to be elicited.  He states he has been feeling badly both with and without oncologic intervention. He has "breathing spells" with SOB about every 6 hours. He is on 2-3 lpm of O2 at baseline. This has progressively gotten worse and has a poor quality of life.  Family states he fixates on BMs. He has foot drop and wears a brace. He uses a walker while in the house and wheel chair for long distance.   He would like to monitor his new chest mass. He would want biopsy and treatment if it grows. He would like to continue dialysis. He is not ready for hospice as he would like to continue to treat the treatable. He would never want a feeding tube. He would never want to be on a ventilator for respiratory distress and would not want chest compressions, shocks, or a breathing tube for cardio/pulmonary arrest.     MOST form completed for DNR, limited additional interventions (continue dialysis, and would want cardioversion), abx okay, IV fluids okay, no feeding tube.     SUMMARY OF RECOMMENDATIONS   DNR/DNI. Would like to monitor chest mass and biopsy and treat if it increases in size. He is worried the biopsy could cause spread and without biopsy it could sit for years.    Code Status/Advance Care Planning:  DNR    Symptom Management:   Per primary team.     Prognosis:   Poor long term.   Discharge Planning: To Be Determined      Primary Diagnoses: Present on Admission: . Sepsis ([place])   I have reviewed the medical record, interviewed the patient and family, and examined the patient. The following aspects are pertinent.  Past Medical History:  Diagnosis Date  . Anemia   . Arthritis   . Bladder cancer ([place])   . Chronic kidney disease   . Diabetes mellitus without complication ([place])   . Dialysis patient [facility])    M,W,F  . Dyspnea    DOE  .  GERD (gastroesophageal reflux disease)   . HOH (hard of hearing)   . Hypertension   . IBS (irritable bowel syndrome)   . Lymphoma ([place]) 09/27/2014  . Neuropathy    RIGHT LEG  . Stroke [facility])    TIA   Social History   Socioeconomic History  . Marital status: Married    Spouse name: Not on file  . Number of children: Not on file  . Years of education: Not on file  . Highest education level: Not on file  Occupational History  . Occupation: retired  Scientific laboratory technician  . Financial resource strain: Not on file  . Food insecurity:    Worry: Not on file    Inability: Not on file  . Transportation needs:    Medical: Not on file    Non-medical: Not on file  Tobacco Use  . Smoking status: Former Smoker    Types: Cigarettes    Last attempt to quit: 05/19/2013    Years since quitting: 4.5  . Smokeless tobacco: Never Used  . Tobacco comment: quit  Substance and Sexual Activity  . Alcohol use: No    Alcohol/week: 0.0 standard  drinks  . Drug use: No  . Sexual activity: Not on file  Lifestyle  . Physical activity:    Days per week: Not on file    Minutes per session: Not on file  . Stress: Not on file  Relationships  . Social connections:    Talks on phone: Not on file    Gets together: Not on file    Attends religious service: Not on file    Active member of club or organization: Not on file    Attends meetings of clubs or organizations: Not on file    Relationship status: Not on file  Other Topics Concern  . Not on file  Social History Narrative  . Not on file   Family History  Problem Relation Age of Onset  . Cancer Mother   . Kidney cancer Neg Hx   . Prostate cancer Neg Hx   . Kidney failure Neg Hx   . Bladder Cancer Neg Hx    Scheduled Meds: . amLODipine  10 mg Oral Daily  . calcium acetate  2,001 mg Oral TID WC   And  . calcium acetate  1,334 mg Oral With snacks  . Difluprednate  1 drop Left Eye BID  . epoetin (EPOGEN/PROCRIT) injection  10,000 Units Intravenous Q M,W,F-HD  . feeding supplement (NEPRO CARB STEADY)  237 mL Oral BID BM  . ferric citrate  420 mg Oral TID WC  . furosemide  80 mg Intravenous Once  . gabapentin  100 mg Oral BID  . insulin aspart  0-5 Units Subcutaneous QHS  . insulin aspart  0-9 Units Subcutaneous TID WC  . insulin detemir  12 Units Subcutaneous Q2200  . ipratropium-albuterol  3 mL Nebulization Q6H  . metoCLOPramide  5 mg Oral TID AC  . multivitamin  1 tablet Oral QHS  . multivitamin-lutein  1 capsule Oral Daily  . pravastatin  20 mg Oral q1800  . vitamin C  250 mg Oral BID   Continuous Infusions: . cefTRIAXone (ROCEPHIN)  IV Stopped (12/17/17 1844)  . vancomycin Stopped (12/17/17 1436)   PRN Meds:.acetaminophen, docusate sodium, fluticasone, nystatin cream, Polyvinyl Alcohol-Povidone PF, silver sulfADIAZINE, sodium chloride Medications Prior to Admission:  Prior to Admission medications   Medication Sig Start Date End Date Taking? Authorizing  Provider  amLODipine (NORVASC) 10 MG tablet  Take 10 mg by mouth daily.  06/02/13  Yes [provider]  aspirin 81 MG EC tablet Take 81 mg by mouth daily.    Yes [provider]  calcium acetate (PHOSLO) 667 MG capsule Take 1,334-2,001 mg by mouth See admin instructions. 3 caps with meals and 2 caps with snacks   Yes [provider]  Difluprednate (DUREZOL) 0.05 % EMUL Place 1 drop into the left eye 2 (two) times daily.    Yes [provider]  docusate sodium (COLACE) 100 MG capsule Take 100 mg by mouth daily as needed for mild constipation.   Yes [provider]  ENSURE PLUS (ENSURE PLUS) LIQD Take 1 Can by mouth daily.  01/26/14  Yes [provider]  ferric citrate (AURYXIA) 1 GM 210 MG(Fe) tablet Take 420 mg by mouth 3 (three) times daily with meals.   Yes [provider]  fluticasone (FLONASE) 50 MCG/ACT nasal spray Place 1 spray into both nostrils daily as needed for allergies.  12/14/14  Yes [provider]  gabapentin (NEURONTIN) 100 MG capsule Take 1 capsule (100 mg total) by mouth 3 (three) times daily. Patient taking differently: Take 100 mg by mouth 2 (two) times daily.  03/02/16  Yes Edrick Kins, DPM  glimepiride (AMARYL) 4 MG tablet Take 4 mg by mouth at bedtime. Prn glucose levels 10/06/14  Yes [provider]  LEVEMIR FLEXTOUCH 100 UNIT/ML Pen Inject 5-6 Units into the skin daily at 10 pm.  08/21/14  Yes [provider]  lovastatin (MEVACOR) 20 MG tablet Take 1 tablet by mouth daily. 12/25/16  Yes [provider]  metoCLOPramide (REGLAN) 5 MG tablet Take 5 mg by mouth 3 (three) times daily before meals. Patient takes 1 tab with each meal.   Yes [provider]  multivitamin (RENA-VIT) TABS tablet Take 1 tablet by mouth at bedtime.    Yes [provider]  nystatin cream (MYCOSTATIN) Apply 1 application topically 2 (two) times daily as needed (irritation).    Yes [provider]  Polyvinyl Alcohol-Povidone PF (REFRESH) 1.4-0.6 % SOLN Apply 1 drop to eye 3 (three) times daily as needed (Dry eyes).   Yes [provider]  silver sulfADIAZINE (SILVADENE) 1 % cream APPLY 1 APPLICATION TOPICALY DAILY Patient taking differently: Apply 1 application topically daily as needed (sores).  04/12/17  Yes Edrick Kins, DPM   Allergies  Allergen Reactions  . Heparin Nausea Only  . Ibuprofen     DUE TO DIALYSIS  . Multivitamin [Centrum]     DUE TO DIALYSIS  . Daypro [Oxaprozin] Swelling and Rash    Other reaction(s): Other (See Comments)  . Tape Rash   Review of Systems  Respiratory: Positive for shortness of breath.     Physical Exam  Constitutional: No distress.  Pulmonary/Chest: Effort normal.  Neurological: He is alert.  Oriented    Vital Signs: BP (!) 151/63 (BP Location: Right Leg)   Pulse 90   Temp 98.7 F (37.1 C) (Axillary)   Resp 20   Ht 5\' 11"  (1.803 m)   Wt 89.5 kg   SpO2 92%   BMI 27.52 kg/m  Pain Scale: 0-10   Pain Score: Asleep   SpO2: SpO2: 92 % O2 Device:SpO2: 92 % O2 Flow Rate: .O2 Flow Rate (L/min): 10 L/min  IO: Intake/output summary:   Intake/Output Summary (Last 24 hours) at 12/18/2017 1421 Last data filed at 12/18/2017 1010 Gross per 24 hour  Intake 819.2 ml  Output 125 ml  Net 694.2 ml    LBM: Last BM Date: 12/16/17 Baseline Weight: Weight: 86.2 kg Most recent weight: Weight: 89.5 kg     Palliative Assessment/Data:     Time In: 1:30 Time Out: 2:40 Time Total: 70 min Greater than 50%  of this time was spent counseling and coordinating care related to the above assessment and plan.  Signed by: Asencion Gowda, NP   Please contact Palliative Medicine Team phone at [phone] for questions and concerns.  For individual provider: See Shea Evans

## 2017-12-18 NOTE — Progress Notes (Signed)
[place] [facility] [place] NAME: Jaedon Siler    MR#:  [id number]  Kemmerer:  02-08-1946  SUBJECTIVE: Patient has shortness of breath, worsening hypoxia.  Received Lasix last night.  No chest pain,   CHIEF COMPLAINT:   Chief Complaint  Patient presents with  . Altered Mental Status  .  REVIEW OF SYSTEMS:   Review of Systems  Constitutional: Negative for chills, fever, malaise/fatigue and weight loss.  HENT: Negative for hearing loss.   Eyes: Negative for blurred vision, double vision and photophobia.  Respiratory: Positive for cough and shortness of breath. Negative for hemoptysis.   Cardiovascular: Negative for palpitations, orthopnea and leg swelling.  Gastrointestinal: Negative for abdominal pain, diarrhea and vomiting.  Genitourinary: Negative for dysuria and urgency.  Musculoskeletal: Negative for myalgias and neck pain.  Skin: Negative for rash.  Neurological: Negative for dizziness, focal weakness, seizures, weakness and headaches.  Psychiatric/Behavioral: Negative for memory loss. The patient does not have insomnia.     DRUG ALLERGIES:   Allergies  Allergen Reactions  . Heparin Nausea Only  . Ibuprofen     DUE TO DIALYSIS  . Multivitamin [Centrum]     DUE TO DIALYSIS  . Daypro [Oxaprozin] Swelling and Rash    Other reaction(s): Other (See Comments)  . Tape Rash    VITALS:  Blood pressure (!) 151/63, pulse 90, temperature 98.7 F (37.1 C), temperature source Axillary, resp. rate 20, height 5\' 11"  (1.803 m), weight 89.5 kg, SpO2 91 %.  PHYSICAL EXAMINATION:  GENERAL:  72 y.o.-year-old patient lying in the bed with no acute distress.  EYES: Pupils equal, round, reactive to light and accommodation. No scleral icterus. Extraocular muscles intact.  Left eye conjunctival hemorrhage present HEENT: Head atraumatic, normocephalic. Oropharynx and nasopharynx clear.  NECK:  Supple, no jugular venous distention. No  thyroid enlargement, no tenderness.  LUNGS: Bilateral basal crepitations present. CARDIOVASCULAR: S1, S2 normal. No murmurs, rubs, or gallops.  ABDOMEN: Soft, nontender, nondistended. Bowel sounds present. No organomegaly or mass.  EXTREMITIES: Trace pedal edema.  Cyanosis, or clubbing.  NEUROLOGIC: Cranial nerves II through XII are intact. Muscle strength 5/5 in all extremities. Sensation intact. Gait not checked.  PSYCHIATRIC: The patient is alert and oriented x 3.  SKIN: No obvious rash, lesion, or ulcer.    LABORATORY PANEL:   CBC Recent Labs  Lab 12/17/17 1108  WBC 3.1*  HGB 6.4*  HCT 18.4*  PLT 92*   ------------------------------------------------------------------------------------------------------------------  Chemistries  Recent Labs  Lab 12/15/17 1748  NA 139  K 5.3*  CL 101  CO2 27  GLUCOSE 130*  BUN 39*  CREATININE 6.06*  CALCIUM 7.7*  AST 25  ALT 26  ALKPHOS 72  BILITOT 1.0   ------------------------------------------------------------------------------------------------------------------  Cardiac Enzymes No results for input(s): TROPONINI in the last 168 hours. ------------------------------------------------------------------------------------------------------------------  RADIOLOGY:  No results found.  EKG:   Orders placed or performed during the hospital encounter of 12/15/17  . EKG 12-Lead  . EKG 12-Lead    ASSESSMENT AND PLAN:   #1. clinical sepsis with fever on admission: On Rocephin, vancomycin,  blood cultures, urine cultures are negative, chest x-ray did not show any pneumonia.  No further fever.  No leukocytosis.  Discontinue antibiotics tomorrow.  Patient will finish 5 days of antibiotics tomorrow.  2.  Acute on chronic anemia: Recurrent anemia requiring blood transfusions, progressing CLL, recently diagnosed with right breast cancer, blood transfusion tomorrow with hemodialysis, discussed with patient and he  is agreeable for  that.  3.  ESRD: On hemodialysis Monday, Wednesday, Friday.  Patient finished full session of hemodialysis yesterday but today he feels short of breath with increased oxygen requirements, I spoke with Dr. Juleen China arranging for short session of hemodialysis today.   #4 constipation: Patient says he takes laxatives every day.  Lactulose is ordered. 5.  GERD: Continue PPI 6.  Diabetes mellitus type 2: Controlled, adjusted the Levemir.   Patient is on Amaryl, Levemir at home, started on Levemir.,  Sliding scale insulin coverage while in the hospital . 7.  Left eye infection: Patient does use special eyedrops which we do not have it here.  Asked the family to get his Durezol that he takes for left eye infection. 8.  Chronic respiratory failure,, on home oxygen 4 L . #9 .recently diagnosed with right breast mass, advanced CLL: Patient does not want any further chemotherapy or work-up for his cancers and wants to talk to palliative care.  Spoke with Dr. Rogue Bussing yesterday, spoke with Crystal from palliative care also.  Likely discharge home tomorrow up with palliative care to follow.  Discussed the plan with patient's daughter as well.  More than 50% of  time spent in counseling, coordination of care  .All the records are reviewed and case discussed with Care Management/Social Workerr. Management plans discussed with the patient, family and they are in agreement.  CODE STATUS: full  TOTAL TIME TAKING CARE OF THIS PATIENT: 48minutes.   POSSIBLE D/C IN 1-2DAYS, DEPENDING ON CLINICAL CONDITION.   [name] M.D on 12/18/2017 at 1:29 PM  Between 7am to 6pm - Pager - [phone]  After 6pm go to [email or web address] - password [email or web address] [facility] [place]  Office  [phone]  CC: Primary care physician; Albina Billet, MD   Note: This dictation was prepared with Dragon dictation along with smaller phrase technology. Any transcriptional errors that result from this process  are unintentional.

## 2017-12-19 LAB — GLUCOSE, CAPILLARY
GLUCOSE-CAPILLARY: 162 mg/dL — AB (ref 70–99)
GLUCOSE-CAPILLARY: 284 mg/dL — AB (ref 70–99)
GLUCOSE-CAPILLARY: 382 mg/dL — AB (ref 70–99)

## 2017-12-19 LAB — PREPARE RBC (CROSSMATCH)

## 2017-12-19 MED ORDER — LACTULOSE 10 GM/15ML PO SOLN
20.0000 g | Freq: Every day | ORAL | Status: DC | PRN
  Administered 2017-12-19 – 2017-12-22 (×2): 20 g via ORAL
  Filled 2017-12-19 (×3): qty 30

## 2017-12-19 MED ORDER — METHYLPREDNISOLONE SODIUM SUCC 40 MG IJ SOLR
40.0000 mg | Freq: Every day | INTRAMUSCULAR | Status: DC
  Administered 2017-12-19 – 2017-12-22 (×4): 40 mg via INTRAVENOUS
  Filled 2017-12-19 (×4): qty 1

## 2017-12-19 MED ORDER — BUDESONIDE 0.5 MG/2ML IN SUSP
0.5000 mg | Freq: Two times a day (BID) | RESPIRATORY_TRACT | Status: DC
  Administered 2017-12-19 – 2017-12-23 (×8): 0.5 mg via RESPIRATORY_TRACT
  Filled 2017-12-19 (×9): qty 2

## 2017-12-19 NOTE — Progress Notes (Signed)
HD Tx ended   12/19/17 1150  Vital Signs  Pulse Rate 85  Pulse Rate Source Monitor  Resp 17  BP (!) 150/69  BP Location Right Leg  BP Method Automatic  Patient Position (if appropriate) Lying  Oxygen Therapy  SpO2 100 %  O2 Device HFNC  Heater temperature 50 F (10 C)  During Hemodialysis Assessment  HD Safety Checks Performed Yes  Bolus Amount (mL) 250 mL  Intra-Hemodialysis Comments Tx completed

## 2017-12-19 NOTE — Progress Notes (Signed)
[facility]  ROUNDING NOTE   Subjective:   Seen and examined on hemodialysis. Had some some nausea and vomiting.  PRBC transfusion     HEMODIALYSIS FLOWSHEET:  Blood Flow Rate (mL/min): 400 mL/min Arterial Pressure (mmHg): -160 mmHg Venous Pressure (mmHg): 130 mmHg Transmembrane Pressure (mmHg): 70 mmHg Ultrafiltration Rate (mL/min): 980 mL/min Dialysate Flow Rate (mL/min): 600 ml/min Conductivity: Machine : 14.2 Conductivity: Machine : 14.2 Dialysis Fluid Bolus: Normal Saline Bolus Amount (mL): 250 mL    Objective:  Vital signs in last 24 hours:  Temp:  [98.1 F (36.7 C)-98.8 F (37.1 C)] 98.4 F (36.9 C) (09/18 1030) Pulse Rate:  [76-93] 83 (09/18 1030) Resp:  [14-24] 16 (09/18 1030) BP: (93-156)/(48-113) 149/58 (09/18 1030) SpO2:  [82 %-100 %] 100 % (09/18 1030) Weight:  [85.3 kg] 85.3 kg (09/18 0750)  Weight change:  Filed Weights   12/17/17 0958 12/17/17 1405 12/19/17 0750  Weight: 91.3 kg 89.5 kg 85.3 kg    Intake/Output: I/O last 3 completed shifts: In: 1051.5 [P.O.:720; I.V.:11.9; IV Piggyback:319.7] Out: 100 [Urine:100]   Intake/Output this shift:  No intake/output data recorded.  Physical Exam: General: No acute distress  Head: Normocephalic, atraumatic. Moist oral mucosal membranes  Eyes: Anicteric  Neck: Supple, trachea midline  Lungs:  Crackles at bases, [place] O2   Heart: S1S2 no rubs  Abdomen:  Soft, nontender, bowel sounds present  Extremities: No peripheral edema.  Neurologic: Awake, alert, following commands  Skin: No lesions  Access: LUE AVF    Basic Metabolic Panel: Recent Labs  Lab 12/15/17 1748  NA 139  K 5.3*  CL 101  CO2 27  GLUCOSE 130*  BUN 39*  CREATININE 6.06*  CALCIUM 7.7*    Liver Function Tests: Recent Labs  Lab 12/15/17 1748  AST 25  ALT 26  ALKPHOS 72  BILITOT 1.0  PROT 6.2*  ALBUMIN 3.6   No results for input(s): LIPASE, AMYLASE in the last 168 hours. No results for input(s): AMMONIA  in the last 168 hours.  CBC: Recent Labs  Lab 12/15/17 1748 12/16/17 1005 12/17/17 1108  WBC 5.2 4.3 3.1*  NEUTROABS 3.5  --   --   HGB 6.6* 8.2* 6.4*  HCT 20.4* 24.2* 18.4*  MCV 99.2 96.9 95.4  PLT 117* 96* 92*    Cardiac Enzymes: No results for input(s): CKTOTAL, CKMB, CKMBINDEX, TROPONINI in the last 168 hours.  BNP: Invalid input(s): POCBNP  CBG: Recent Labs  Lab 12/17/17 2042 12/18/17 0741 12/18/17 1205 12/18/17 1629 12/18/17 2045  GLUCAP 230* 189* 268* 290* 54*    Microbiology: Results for orders placed or performed during the hospital encounter of 12/15/17  Culture, blood (Routine x 2)     Status: None (Preliminary result)   Collection Time: 12/15/17  5:48 PM  Result Value Ref Range Status   Specimen Description BLOOD RIGHT ANTECUBITAL  Final   Special Requests   Final    BOTTLES DRAWN AEROBIC AND ANAEROBIC Blood Culture results may not be optimal due to an excessive volume of blood received in culture bottles   Culture   Final    NO GROWTH 4 DAYS Performed at [facility], [address], [place], [place] [address]    Report Status PENDING  Incomplete  Urine Culture     Status: Abnormal   Collection Time: 12/15/17  5:48 PM  Result Value Ref Range Status   Specimen Description   Final    URINE, RANDOM Performed at [facility], [address] [place], [place] [address]    Special Requests   Final    NONE Performed at [facility], [address] [place],  [address]    Culture MULTIPLE SPECIES PRESENT, SUGGEST RECOLLECTION (A)  Final   Report Status 12/17/2017 FINAL  Final    Coagulation Studies: No results for input(s): LABPROT, INR in the last 72 hours.  Urinalysis: No results for input(s): COLORURINE, LABSPEC, PHURINE, GLUCOSEU, HGBUR, BILIRUBINUR, KETONESUR, PROTEINUR, UROBILINOGEN, NITRITE, LEUKOCYTESUR in the last 72 hours.  Invalid input(s): APPERANCEUR    Imaging: Dg Chest Port 1  View  Result Date: 12/18/2017 CLINICAL DATA:  Shortness of breath. EXAM: PORTABLE CHEST 1 VIEW COMPARISON:  Radiograph of December 15, 2017. FINDINGS: The heart size and mediastinal contours are within normal limits. Stable bilateral pulmonary edema is noted. Mild bilateral pleural effusions are noted which are increased compared to prior exam. No pneumothorax is noted. Atherosclerosis of thoracic aorta is noted. The visualized skeletal structures are unremarkable. IMPRESSION: Bilateral pulmonary edema with mild bilateral pleural effusions. Aortic Atherosclerosis (ICD10-I70.0). Electronically Signed   By: Marijo Conception, M.D.   On: 12/18/2017 15:10     Medications:   . sodium chloride Stopped (12/18/17 2023)  . cefTRIAXone (ROCEPHIN)  IV Stopped (12/18/17 1914)  . vancomycin Stopped (12/17/17 1436)   . sodium chloride   Intravenous Once  . amLODipine  10 mg Oral Daily  . calcium acetate  2,001 mg Oral TID WC   And  . calcium acetate  1,334 mg Oral With snacks  . Difluprednate  1 drop Left Eye BID  . epoetin (EPOGEN/PROCRIT) injection  10,000 Units Intravenous Q M,W,F-HD  . feeding supplement (NEPRO CARB STEADY)  237 mL Oral BID BM  . ferric citrate  420 mg Oral TID WC  . furosemide  80 mg Intravenous Once  . gabapentin  100 mg Oral BID  . insulin aspart  0-5 Units Subcutaneous QHS  . insulin aspart  0-9 Units Subcutaneous TID WC  . insulin detemir  12 Units Subcutaneous Q2200  . ipratropium-albuterol  3 mL Nebulization Q6H  . metoCLOPramide  5 mg Oral TID AC  . multivitamin  1 tablet Oral QHS  . multivitamin-lutein  1 capsule Oral Daily  . pravastatin  20 mg Oral q1800  . vitamin C  250 mg Oral BID   sodium chloride, acetaminophen, docusate sodium, fluticasone, nystatin cream, Polyvinyl Alcohol-Povidone PF, silver sulfADIAZINE, sodium chloride  Assessment/ Plan:  Mark [name]. is a 72 y.o. black male with end stage renal disease on hemodialysis, CLL, diabetes  mellitus type II, COPD, history of bladder cancer who is admitted to [facility] on 12/15/2017 for shortness of breath.   [facility] MWF [facility] L AVF 90.5kg  1.  ESRD on HD MWF. Tolerating hemodialysis well. UF goal of 2.9 liters.   2.  Anemia of chronic kidney disease.  Hemoglobin 6.4 - PRBC transfusion.   - EPO with HD treatment. IV venofer as outpatient.  - Appreciate hematology. - Auryxia  3.  Secondary hyperparathyroidism with hyperphosphatemia: patient states he will take these phos binders the way he wants.  - Continue Auryxia and calcium acetate. Patient discretion.   4. Hypertension: blood pressure at goal.  - amlodipine.    LOS: 4 Reta Norgren 9/18/201911:27 AM

## 2017-12-19 NOTE — Progress Notes (Signed)
Pre HD Tx    12/19/17 0745  Neurological  Level of Consciousness Alert  Orientation Level Oriented X4  Respiratory  Respiratory Pattern Regular  Chest Assessment Chest expansion symmetrical  Bilateral Breath Sounds Diminished  Cardiac  Pulse Regular  Heart Sounds S1, S2  ECG Monitor Yes  Vascular  R Radial Pulse +2  L Radial Pulse +2  Edema Generalized  Generalized Edema +1  Psychosocial  Psychosocial (WDL) WDL  Patient Behaviors Calm;Cooperative

## 2017-12-19 NOTE — Progress Notes (Signed)
Pre HD    12/19/17 0750  Vital Signs  Temp 98.1 F (36.7 C)  Temp Source Oral  Pulse Rate 81  Pulse Rate Source Monitor  Resp (!) 21  BP (!) 156/67  BP Location Right Leg  BP Method Automatic  Patient Position (if appropriate) Lying  Oxygen Therapy  SpO2 100 %  O2 Device HFNC  O2 Flow Rate (L/min) 10 L/min  Pain Assessment  Pain Scale 0-10  Pain Score 0  Dialysis Weight  Weight 85.3 kg  Type of Weight Pre-Dialysis  Time-Out for Hemodialysis  What Procedure? HD   Pt Identifiers(min of two) First/Last Name;MRN/Account#  Correct Site? Yes  Correct Side? Yes  Correct Procedure? Yes  Consents Verified? Yes  Rad Studies Available? N/A  Safety Precautions Reviewed? Yes  Engineer, civil (consulting) Number [phone]  Station Number 1  UF/Alarm Test Passed  Conductivity: Meter 13.6  Conductivity: Machine  13.8  pH 7.4  Reverse Osmosis Main  Normal Saline Lot Number [id number]  Dialyzer Lot Number [id number]  Air Detector Armed and Audible Yes  Blood Lines Intact and Secured Yes  Pre Treatment Patient Checks  Vascular access used during treatment Fistula  Hepatitis B Surface Antigen Results Negative  Hepatitis B Surface Antibody 9  Date Hepatitis B Surface Antibody Drawn 04/30/17  Hemodialysis Consent Verified Yes  Hemodialysis Standing Orders Initiated Yes  ECG (Telemetry) Monitor On Yes  Prime Ordered Normal Saline  Length of  DialysisTreatment -hour(s) 3.5 Hour(s)  Dialysis Treatment Comments Na 140  Dialyzer Elisio 17H NR  Dialysate 3K, 2.5 Ca  Dialysis Anticoagulant None  Dialysate Flow Ordered 600  Blood Flow Rate Ordered 400 mL/min  Ultrafiltration Goal 2 Liters  Pre Treatment Labs Type & Screen  Dialysis Blood Pressure Support Ordered Normal Saline  Education / Care Plan  Dialysis Education Provided Yes  Documented Education in Care Plan Yes

## 2017-12-19 NOTE — Progress Notes (Signed)
PT Cancellation Note  Patient Details Name: Mark [name]. MRN: [id number] DOB: 27-Jun-1945   Cancelled Treatment:    Reason Eval/Treat Not Completed: Patient at procedure or test/unavailable. Pt in hemodialysis this morning. Re attempt at a later time/date as the schedule allows.   Larae Grooms, PTA 12/19/2017, 11:07 AM

## 2017-12-19 NOTE — Progress Notes (Signed)
Pre HD Tx    12/19/17 0753  Vital Signs  Pulse Rate 81  Pulse Rate Source Monitor  Resp 20  BP 134/78  BP Location Right Leg  BP Method Automatic  Patient Position (if appropriate) Lying  Oxygen Therapy  SpO2 100 %  O2 Device HFNC  O2 Flow Rate (L/min) 10 L/min  Pain Assessment  Pain Scale 0-10  Pain Score 0  During Hemodialysis Assessment  Blood Flow Rate (mL/min) 400 mL/min  Arterial Pressure (mmHg) -160 mmHg  Venous Pressure (mmHg) 130 mmHg  Transmembrane Pressure (mmHg) 70 mmHg  Ultrafiltration Rate (mL/min) 830 mL/min  Dialysate Flow Rate (mL/min) 600 ml/min  Conductivity: Machine  13.6  HD Safety Checks Performed Yes  Dialysis Fluid Bolus Normal Saline  Bolus Amount (mL) 250 mL  Intra-Hemodialysis Comments Tx initiated

## 2017-12-19 NOTE — Progress Notes (Signed)
Post HD Assessment, pt tolerated tx well, received a unit of blood. No change from baseline, reports feeling better.    12/19/17 1220  Neurological  Level of Consciousness Alert  Orientation Level Oriented X4  Cardiac  Pulse Regular  Heart Sounds S1, S2  ECG Monitor Yes  Vascular  R Radial Pulse +2  L Radial Pulse +2  Edema Generalized  Generalized Edema +1  Psychosocial  Psychosocial (WDL) WDL  Patient Behaviors Calm;Cooperative

## 2017-12-19 NOTE — Progress Notes (Signed)
Post HD Tx

## 2017-12-19 NOTE — Progress Notes (Signed)
Patient ID: Regenia Skeeter., male   DOB: [birth year], 72 y.o.   MRN: [id number]  [facility] PROGRESS NOTE  Laretta Alstrom [name]. WUJ:[id number] DOB: [birth year] DOA: 12/15/2017 PCP: Albina Billet, MD  HPI/Subjective: Patient feeling better than when he came in.  Patient is making jokes.  Feels a little short of breath and little coughing.  Had breathing episode yesterday where they had to put him on high flow nasal cannula.  Objective: Vitals:   12/19/17 1200 12/19/17 1315  BP: 105/70 138/85  Pulse: 81 93  Resp: 14 18  Temp: 98.2 F (36.8 C) 99.4 F (37.4 C)  SpO2: 100% 96%    Filed Weights   12/17/17 1405 12/19/17 0750 12/19/17 1200  Weight: 89.5 kg 85.3 kg 85.6 kg    ROS: Review of Systems  Constitutional: Negative for chills and fever.  Eyes: Negative for blurred vision.  Respiratory: Positive for cough and shortness of breath.   Cardiovascular: Negative for chest pain.  Gastrointestinal: Negative for abdominal pain, constipation, diarrhea, nausea and vomiting.  Genitourinary: Negative for dysuria.  Musculoskeletal: Negative for joint pain.  Neurological: Negative for dizziness and headaches.   Exam: Physical Exam  Constitutional: He is oriented to person, place, and time.  HENT:  Nose: No mucosal edema.  Mouth/Throat: No oropharyngeal exudate or posterior oropharyngeal edema.  Eyes: Pupils are equal, round, and reactive to light. Conjunctivae, EOM and lids are normal.  Neck: No JVD present. Carotid bruit is not present. No edema present. No thyroid mass and no thyromegaly present.  Cardiovascular: S1 normal, S2 normal and normal heart sounds. Exam reveals no gallop.  No murmur heard. Pulses:      Dorsalis pedis pulses are 1+ on the right side, and 1+ on the left side.  Respiratory: No respiratory distress. He has no wheezes. He has no rhonchi. He has no rales.  GI: Soft. Bowel sounds are normal. There is no tenderness.  Musculoskeletal:       Right ankle:  He exhibits swelling.       Left ankle: He exhibits swelling.  Lymphadenopathy:    He has no cervical adenopathy.  Neurological: He is alert and oriented to person, place, and time. No cranial nerve deficit.  Skin: Skin is warm. Nails show no clubbing.  Chronic lower extremity skin discoloration  Psychiatric: He has a normal mood and affect.      Data Reviewed: Basic Metabolic Panel: Recent Labs  Lab 12/15/17 1748  NA 139  K 5.3*  CL 101  CO2 27  GLUCOSE 130*  BUN 39*  CREATININE 6.06*  CALCIUM 7.7*   Liver Function Tests: Recent Labs  Lab 12/15/17 1748  AST 25  ALT 26  ALKPHOS 72  BILITOT 1.0  PROT 6.2*  ALBUMIN 3.6   CBC: Recent Labs  Lab 12/15/17 1748 12/16/17 1005 12/17/17 1108  WBC 5.2 4.3 3.1*  NEUTROABS 3.5  --   --   HGB 6.6* 8.2* 6.4*  HCT 20.4* 24.2* 18.4*  MCV 99.2 96.9 95.4  PLT 117* 96* 92*    CBG: Recent Labs  Lab 12/18/17 0741 12/18/17 1205 12/18/17 1629 12/18/17 2045 12/19/17 1328  GLUCAP 189* 268* 290* 211* 162*    Recent Results (from the past 240 hour(s))  Culture, blood (Routine x 2)     Status: None (Preliminary result)   Collection Time: 12/15/17  5:48 PM  Result Value Ref Range Status   Specimen Description BLOOD RIGHT ANTECUBITAL  Final   Special Requests  Final    BOTTLES DRAWN AEROBIC AND ANAEROBIC Blood Culture results may not be optimal due to an excessive volume of blood received in culture bottles   Culture   Final    NO GROWTH 4 DAYS Performed at [facility], [address], [place], [place] [address]    Report Status PENDING  Incomplete  Urine Culture     Status: Abnormal   Collection Time: 12/15/17  5:48 PM  Result Value Ref Range Status   Specimen Description   Final    URINE, RANDOM Performed at [facility], [address] [place], [place] [address]    Special Requests   Final    NONE Performed at [facility], [address], [place], [place] [address]    Culture  MULTIPLE SPECIES PRESENT, SUGGEST RECOLLECTION (A)  Final   Report Status 12/17/2017 FINAL  Final     Studies: Dg Chest Port 1 View  Result Date: 12/18/2017 CLINICAL DATA:  Shortness of breath. EXAM: PORTABLE CHEST 1 VIEW COMPARISON:  Radiograph of December 15, 2017. FINDINGS: The heart size and mediastinal contours are within normal limits. Stable bilateral pulmonary edema is noted. Mild bilateral pleural effusions are noted which are increased compared to prior exam. No pneumothorax is noted. Atherosclerosis of thoracic aorta is noted. The visualized skeletal structures are unremarkable. IMPRESSION: Bilateral pulmonary edema with mild bilateral pleural effusions. Aortic Atherosclerosis (ICD10-I70.0). Electronically Signed   By: Marijo Conception, M.D.   On: 12/18/2017 15:10    Scheduled Meds: . sodium chloride   Intravenous Once  . amLODipine  10 mg Oral Daily  . budesonide (PULMICORT) nebulizer solution  0.5 mg Nebulization BID  . calcium acetate  2,001 mg Oral TID WC   And  . calcium acetate  1,334 mg Oral With snacks  . Difluprednate  1 drop Left Eye BID  . epoetin (EPOGEN/PROCRIT) injection  10,000 Units Intravenous Q M,W,F-HD  . feeding supplement (NEPRO CARB STEADY)  237 mL Oral BID BM  . ferric citrate  420 mg Oral TID WC  . furosemide  80 mg Intravenous Once  . gabapentin  100 mg Oral BID  . insulin aspart  0-5 Units Subcutaneous QHS  . insulin aspart  0-9 Units Subcutaneous TID WC  . insulin detemir  12 Units Subcutaneous Q2200  . ipratropium-albuterol  3 mL Nebulization Q6H  . methylPREDNISolone (SOLU-MEDROL) injection  40 mg Intravenous Daily  . metoCLOPramide  5 mg Oral TID AC  . multivitamin  1 tablet Oral QHS  . multivitamin-lutein  1 capsule Oral Daily  . pravastatin  20 mg Oral q1800  . vitamin C  250 mg Oral BID   Continuous Infusions: . sodium chloride Stopped (12/18/17 2023)  . cefTRIAXone (ROCEPHIN)  IV Stopped (12/18/17 1914)  . vancomycin Stopped (12/17/17  1436)    Assessment/Plan:  1. Acute hypoxic respiratory failure.  Patient on high flow nasal cannula.  Yesterday's chest x-ray showing fluid.  Case discussed with nephrology to consider dialysis again tomorrow.  Started on nebulizer treatments and steroids empirically.  Patient chronically wears 2 L during the day and 3 L at night.  Reviewed CT scan of the chest 12/11/2017 at [facility].  At that time had groundglass opacities on the chest. 2. Clinical sepsis with fever on admission.  So far blood cultures are negative.  Urine culture shows multiple organisms.  Patient placed on vancomycin and Rocephin.  Temperature low-grade 99 today. 3. End-stage renal disease on hemodialysis Monday Wednesday Friday.  Patient had dialysis today. 4. 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  Follow-up with Dr. Rogue Bussing as outpatient. 5. Acute on chronic anemia.  Ferritin is elevated going along with anemia of chronic disease.  The patient has a history of bladder cancer.  Hold aspirin.  Patient received 3 units of packed red blood cells during the hospital course.  Last transfusion today on a hemoglobin of 6.4. 6. Hypertension on Norvasc 7. Type 2 diabetes mellitus on insulin and sliding scale 8. History of stroke.  Holding aspirin with anemia 9. Acute on chronic diastolic congestive heart failure.  Dialysis to remove fluid.  Echocardiogram greater than 55% EF done this year.  Code Status:     Code Status Orders  (From admission, onward)         Start     Ordered   12/18/17 1441  Limited resuscitation (code)  Continuous    Question Answer Comment  In the event of cardiac or respiratory ARREST: Initiate Code Blue, Call Rapid Response No   In the event of cardiac or respiratory ARREST: Perform CPR No   In the event of cardiac or respiratory ARREST: Perform Intubation/Mechanical Ventilation No   In the event of cardiac or respiratory ARREST: Use NIPPV/BiPAp only if indicated No   In the event of  cardiac or respiratory ARREST: Administer ACLS medications if indicated No   In the event of cardiac or respiratory ARREST: Perform Defibrillation or Cardioversion if indicated No   Comments would want cardioversion      12/18/17 1441        Code Status History    Date Active Date Inactive Code Status Order ID Comments User Context   01/15/2017 0423 01/18/2017 2120 Full Code [id number]  Saundra Shelling, MD Inpatient   10/20/2016 1627 10/22/2016 1817 Full Code [id number]  Lance Coon, MD Inpatient    Advance Directive Documentation     Most Recent Value  Type of Advance Directive  Healthcare Power of [place], Living will  Pre-existing out of facility DNR order (yellow form or pink MOST form)  -  "MOST" Form in Place?  -     Family Communication: Family at bedside and permission to speak medically in front of all them. Disposition Plan: Need to get off high flow nasal cannula prior to disposition.  Consultants:  Nephrology  Palliative care  Antibiotics:  Rocephin  Vancomycin  Time spent: 28 minutes  [place]

## 2017-12-20 ENCOUNTER — Inpatient Hospital Stay: Payer: Medicare HMO | Admitting: Internal Medicine

## 2017-12-20 LAB — TYPE AND SCREEN
ABO/RH(D): B POS
Antibody Screen: NEGATIVE
UNIT DIVISION: 0

## 2017-12-20 LAB — BPAM RBC
BLOOD PRODUCT EXPIRATION DATE: 201910052359
ISSUE DATE / TIME: 201909181012
Unit Type and Rh: 1700

## 2017-12-20 LAB — RENAL FUNCTION PANEL
Albumin: 3.3 g/dL — ABNORMAL LOW (ref 3.5–5.0)
Anion gap: 13 (ref 5–15)
BUN: 54 mg/dL — AB (ref 8–23)
CHLORIDE: 89 mmol/L — AB (ref 98–111)
CO2: 28 mmol/L (ref 22–32)
CREATININE: 5.57 mg/dL — AB (ref 0.61–1.24)
Calcium: 7.4 mg/dL — ABNORMAL LOW (ref 8.9–10.3)
GFR calc non Af Amer: 9 mL/min — ABNORMAL LOW (ref >60)
GFR, EST AFRICAN AMERICAN: 11 mL/min — AB (ref >60)
GLUCOSE: 452 mg/dL — AB (ref 70–99)
Phosphorus: 2.6 mg/dL (ref 2.5–4.6)
Potassium: 4.3 mmol/L (ref 3.5–5.1)
SODIUM: 130 mmol/L — AB (ref 135–145)

## 2017-12-20 LAB — GLUCOSE, CAPILLARY
GLUCOSE-CAPILLARY: 283 mg/dL — AB (ref 70–99)
GLUCOSE-CAPILLARY: 218 mg/dL — AB (ref 70–99)
Glucose-Capillary: 460 mg/dL — ABNORMAL HIGH (ref 70–99)
Glucose-Capillary: 357 mg/dL — ABNORMAL HIGH (ref 70–99)

## 2017-12-20 LAB — CULTURE, BLOOD (ROUTINE X 2): CULTURE: NO GROWTH

## 2017-12-20 LAB — CBC
HCT: 24.9 % — ABNORMAL LOW (ref 40.0–52.0)
Hemoglobin: 8.4 g/dL — ABNORMAL LOW (ref 13.0–18.0)
MCH: 31.4 pg (ref 26.0–34.0)
MCHC: 33.7 g/dL (ref 32.0–36.0)
MCV: 93.2 fL (ref 80.0–100.0)
PLATELETS: 102 10*3/uL — AB (ref 150–440)
RBC: 2.67 MIL/uL — AB (ref 4.40–5.90)
RDW: 18.8 % — ABNORMAL HIGH (ref 11.5–14.5)
WBC: 5.5 10*3/uL (ref 3.8–10.6)

## 2017-12-20 LAB — LACTATE DEHYDROGENASE: LDH: 225 U/L — ABNORMAL HIGH (ref 98–192)

## 2017-12-20 LAB — EXPECTORATED SPUTUM ASSESSMENT W GRAM STAIN, RFLX TO RESP C: Special Requests: NORMAL

## 2017-12-20 MED ORDER — "TRANEXAMIC ACID 5% ORAL SOLUTION ": 10.0000 mL | Freq: Once | ORAL | Status: DC

## 2017-12-20 MED ORDER — INSULIN ASPART 100 UNIT/ML ~~LOC~~ SOLN
4.0000 [IU] | Freq: Three times a day (TID) | SUBCUTANEOUS | Status: DC
  Administered 2017-12-20 – 2017-12-21 (×2): 4 [IU] via SUBCUTANEOUS
  Filled 2017-12-20 (×7): qty 0.04
  Filled 2017-12-20: qty 1
  Filled 2017-12-20 (×3): qty 0.04
  Filled 2017-12-20: qty 1
  Filled 2017-12-20 (×9): qty 0.04

## 2017-12-20 MED ORDER — TRANEXAMIC ACID 1000 MG/10ML IV SOLN
500.0000 mg | Freq: Once | INTRAVENOUS | Status: AC
  Administered 2017-12-20: 500 mg via TOPICAL
  Filled 2017-12-20: qty 10

## 2017-12-20 MED ORDER — CALCIUM ACETATE (PHOS BINDER) 667 MG PO CAPS
667.0000 mg | ORAL_CAPSULE | Freq: Three times a day (TID) | ORAL | Status: DC
  Administered 2017-12-20 – 2017-12-23 (×7): 667 mg via ORAL
  Filled 2017-12-20 (×6): qty 1

## 2017-12-20 MED ORDER — INSULIN DETEMIR 100 UNIT/ML ~~LOC~~ SOLN
15.0000 [IU] | Freq: Every day | SUBCUTANEOUS | Status: DC
  Administered 2017-12-20: 15 [IU] via SUBCUTANEOUS
  Filled 2017-12-20 (×2): qty 0.15

## 2017-12-20 MED ORDER — CALCIUM ACETATE (PHOS BINDER) 667 MG PO CAPS
667.0000 mg | ORAL_CAPSULE | ORAL | Status: DC
  Administered 2017-12-22: 667 mg via ORAL
  Filled 2017-12-20: qty 1

## 2017-12-20 NOTE — Progress Notes (Signed)
CBG elevated.  Dr Leslye Peer notified.  Order received to give patient 12 units  insulin

## 2017-12-20 NOTE — Progress Notes (Signed)
HD Treatment Complete    12/20/17 1535  Vital Signs  Pulse Rate 83  Pulse Rate Source Monitor  Resp 17  BP (!) 147/31  BP Location Right Arm  BP Method Automatic  Patient Position (if appropriate) Lying  Oxygen Therapy  SpO2 92 %  O2 Device Venturi Mask  O2 Flow Rate (L/min) 10 L/min  During Hemodialysis Assessment  Blood Flow Rate (mL/min) 400 mL/min  Arterial Pressure (mmHg) -180 mmHg  Venous Pressure (mmHg) -160 mmHg  Transmembrane Pressure (mmHg) 60 mmHg  Ultrafiltration Rate (mL/min) 1650 mL/min  Dialysate Flow Rate (mL/min) 600 ml/min  Conductivity: Machine  13.6  HD Safety Checks Performed Yes  Intra-Hemodialysis Comments Tolerated well;Tx completed (UF 2332)  Fistula / Graft Left Forearm Arteriovenous fistula  No Placement Date or Time found.   Placed prior to admission: No  Orientation: Left  Access Location: Forearm  Access Type: Arteriovenous fistula  Status Deaccessed

## 2017-12-20 NOTE — Care Management (Signed)
RNCM confirmed with [place] that patient has orders for 2L continuous O2 at home.

## 2017-12-20 NOTE — Progress Notes (Signed)
Post HD Assessment     12/20/17 1539  Neurological  Level of Consciousness Alert  Orientation Level Oriented X4  Respiratory  Respiratory Pattern Labored;Dyspnea with exertion;Symmetrical  Chest Assessment Chest expansion symmetrical  Bilateral Breath Sounds Diminished;Fine crackles  Cardiac  Pulse Regular  Heart Sounds S1, S2  Jugular Venous Distention (JVD) No  ECG Monitor Yes  Cardiac Rhythm NSR  Vascular  R Radial Pulse +2  L Radial Pulse +2  R Dorsalis Pedis Pulse +1  L Dorsalis Pedis Pulse +1  Edema Generalized  Generalized Edema None  Integumentary  Integumentary (WDL) X  Skin Color Appropriate for ethnicity  Skin Condition Dry  Musculoskeletal  Musculoskeletal (WDL) X  Generalized Weakness Yes  GU Assessment  Genitourinary (WDL) X (HD pt)  Psychosocial  Psychosocial (WDL) WDL

## 2017-12-20 NOTE — Progress Notes (Signed)
PT Cancellation Note  Patient Details Name: Mark [name]. MRN: [id number] DOB: December 09, 1945   Cancelled Treatment:    Reason Eval/Treat Not Completed: Patient not medically ready   Pt attempted x 2 this am.  On first attempt Primary RN requested to return later as O2 sats were in 70's.  Returned later in am and pt willing to participate.  Checked O2 sats prior to start and pt at 71% on 6 lpm at rest.  Primary nurse notified and session held.  Will continue as appropriate.  Pt remains motivated to participate in therapy sessions but aware that his breathing status is limiting him at this time.  Voiced fear of increasing weakness with immobility.   Chesley Noon 12/20/2017, 11:25 AM

## 2017-12-20 NOTE — Progress Notes (Addendum)
Inpatient Diabetes Program Recommendations  AACE/ADA: New Consensus Statement on Inpatient Glycemic Control (2019)  Target Ranges:  Prepandial:   less than 140 mg/dL      Peak postprandial:   less than 180 mg/dL (1-2 hours)      Critically ill patients:  140 - 180 mg/dL  Results for IDEN, STRIPLING (MRN [id number]) as of 12/20/2017 12:06  Ref. Range 12/19/2017 13:28 12/19/2017 16:57 12/19/2017 20:48 12/20/2017 07:23 12/20/2017 11:34  Glucose-Capillary Latest Ref Range: 70 - 99 mg/dL 162 (H) 284 (H) 382 (H) 283 (H) 460 (H)    Review of Glycemic Control  Diabetes history: DM2 Outpatient Diabetes medications: Levemir 5-6 units QHS, Amaryl 4 mg QHS Current orders for Inpatient glycemic control: Levemir 12 units QHS, Novolog 0-9 units TID with meals, Novolog 0-5 units QHS; Solumedrol 40 mg daily  Inpatient Diabetes Program Recommendations:  Insulin - Basal: If steroids are continued, please consider increasing Levemir to 15 untis QHS. Insulin - Meal Coverage: If steroids are continued and patient is eating, please consider ordering Novolog 4 units TID with meals for meal coverage if patient eats at least 50% of meals.  Thanks, Barnie Alderman, RN, MSN, CDE Diabetes Coordinator Inpatient Diabetes Program [phone] (Team Pager from 8am to 5pm)

## 2017-12-20 NOTE — Care Management Important Message (Addendum)
Copy of signed IM left with patient's family in room.

## 2017-12-20 NOTE — Progress Notes (Signed)
Patient returned from dialysis.

## 2017-12-20 NOTE — Progress Notes (Signed)
Post HD Treatment  Pt tolerated HD treatment but had some episodes of hypotension, Dr. Juleen China aware and interventions performed. Patient's Net UF was 1832 and his BVP was 58.1. Patient's KT was 43.6 and his KECN was 283. Report was called and given to [place], Therapist, sports. No complaints noted by patient. Patient stated he "felt a little better".    12/20/17 1540  Hand-Off documentation  Report given to (Full Name) Freddrick March, RN  Report received from (Full Name) Stephannie Peters, RN  Vital Signs  Temp 98.5 F (36.9 C)  Temp Source Oral  Pulse Rate 81  Pulse Rate Source Monitor  Resp 17  BP (!) 112/57  BP Location Right Arm  BP Method Automatic  Patient Position (if appropriate) Lying  Oxygen Therapy  SpO2 93 %  O2 Device Venturi Mask  O2 Flow Rate (L/min) 10 L/min  Pain Assessment  Pain Scale 0-10  Pain Score 0  Dialysis Weight  Weight 91.8 kg  Type of Weight Post-Dialysis  Post-Hemodialysis Assessment  Rinseback Volume (mL) 250 mL  KECN 283 V  Dialyzer Clearance Lightly streaked  Duration of HD Treatment -hour(s) 2.5 hour(s)  Hemodialysis Intake (mL) 500 mL  UF Total -Machine (mL) 2332 mL  Net UF (mL) 1832 mL  Tolerated HD Treatment Yes  AVG/AVF Arterial Site Held (minutes) 10 minutes  AVG/AVF Venous Site Held (minutes) 10 minutes  Fistula / Graft Left Forearm Arteriovenous fistula  No Placement Date or Time found.   Placed prior to admission: No  Orientation: Left  Access Location: Forearm  Access Type: Arteriovenous fistula  Site Condition No complications  Fistula / Graft Assessment Present;Thrill;Bruit  Drainage Description None

## 2017-12-20 NOTE — Progress Notes (Signed)
[facility]  ROUNDING NOTE   Subjective:   Hemodialysis treatment yesterday. Cramping and nausea/vomiting - UF of 2 liters. PRBC transfusion yesterday.   Patient continues to have shortness of breath.   Extra dialysis treatment for today.   Objective:  Vital signs in last 24 hours:  Temp:  [97.6 F (36.4 C)-99.4 F (37.4 C)] 97.6 F (36.4 C) (09/19 0537) Pulse Rate:  [70-96] 96 (09/19 1000) Resp:  [14-21] 21 (09/19 0537) BP: (105-153)/(69-109) 112/76 (09/19 1000) SpO2:  [60 %-100 %] 94 % (09/19 1110) FiO2 (%):  [45 %-100 %] 45 % (09/19 0730) Weight:  [85.6 kg] 85.6 kg (09/18 1200)  Weight change:  Filed Weights   12/17/17 1405 12/19/17 0750 12/19/17 1200  Weight: 89.5 kg 85.3 kg 85.6 kg    Intake/Output: I/O last 3 completed shifts: In: 612 [P.O.:120; I.V.:11.5; Blood:410; IV Piggyback:70.5] Out: 2000 [Other:2000]   Intake/Output this shift:  No intake/output data recorded.  Physical Exam: General: No acute distress  Head: Normocephalic, atraumatic. Moist oral mucosal membranes  Eyes: Anicteric  Neck: Supple, trachea midline  Lungs:  Crackles at bases, Green O2   Heart: S1S2 no rubs  Abdomen:  Soft, nontender, bowel sounds present  Extremities: No peripheral edema.  Neurologic: Awake, alert, following commands  Skin: No lesions  Access: LUE AVF    Basic Metabolic Panel: Recent Labs  Lab 12/15/17 1748  NA 139  K 5.3*  CL 101  CO2 27  GLUCOSE 130*  BUN 39*  CREATININE 6.06*  CALCIUM 7.7*    Liver Function Tests: Recent Labs  Lab 12/15/17 1748  AST 25  ALT 26  ALKPHOS 72  BILITOT 1.0  PROT 6.2*  ALBUMIN 3.6   No results for input(s): LIPASE, AMYLASE in the last 168 hours. No results for input(s): AMMONIA in the last 168 hours.  CBC: Recent Labs  Lab 12/15/17 1748 12/16/17 1005 12/17/17 1108  WBC 5.2 4.3 3.1*  NEUTROABS 3.5  --   --   HGB 6.6* 8.2* 6.4*  HCT 20.4* 24.2* 18.4*  MCV 99.2 96.9 95.4  PLT 117* 96* 92*     Cardiac Enzymes: No results for input(s): CKTOTAL, CKMB, CKMBINDEX, TROPONINI in the last 168 hours.  BNP: Invalid input(s): POCBNP  CBG: Recent Labs  Lab 12/18/17 2045 12/19/17 1328 12/19/17 1657 12/19/17 2048 12/20/17 0723  GLUCAP 211* 162* 284* 382* 62*    Microbiology: Results for orders placed or performed during the hospital encounter of 12/15/17  Culture, blood (Routine x 2)     Status: None   Collection Time: 12/15/17  5:48 PM  Result Value Ref Range Status   Specimen Description BLOOD RIGHT ANTECUBITAL  Final   Special Requests   Final    BOTTLES DRAWN AEROBIC AND ANAEROBIC Blood Culture results may not be optimal due to an excessive volume of blood received in culture bottles   Culture   Final    NO GROWTH 5 DAYS Performed at [facility], [address], [place], [place] [address]    Report Status 12/20/2017 FINAL  Final  Urine Culture     Status: Abnormal   Collection Time: 12/15/17  5:48 PM  Result Value Ref Range Status   Specimen Description   Final    URINE, RANDOM Performed at [facility], [address], [place], [place] [address]    Special Requests   Final    NONE Performed at [facility], [address] [place], [place] [address]  Culture MULTIPLE SPECIES PRESENT, SUGGEST RECOLLECTION (A)  Final   Report Status 12/17/2017 FINAL  Final  Expectorated sputum assessment w rflx to resp cult     Status: None   Collection Time: 12/20/17  5:41 AM  Result Value Ref Range Status   Specimen Description SPUTUM  Final   Special Requests Normal  Final   Sputum evaluation   Final    THIS SPECIMEN IS ACCEPTABLE FOR SPUTUM CULTURE Performed at [facility], [address] [place], [place] [address]    Report Status 12/20/2017 FINAL  Final    Coagulation Studies: No results for input(s): LABPROT, INR in the last 72 hours.  Urinalysis: No results for input(s): COLORURINE, LABSPEC, PHURINE, GLUCOSEU,  HGBUR, BILIRUBINUR, KETONESUR, PROTEINUR, UROBILINOGEN, NITRITE, LEUKOCYTESUR in the last 72 hours.  Invalid input(s): APPERANCEUR    Imaging: Dg Chest Port 1 View  Result Date: 12/18/2017 CLINICAL DATA:  Shortness of breath. EXAM: PORTABLE CHEST 1 VIEW COMPARISON:  Radiograph of December 15, 2017. FINDINGS: The heart size and mediastinal contours are within normal limits. Stable bilateral pulmonary edema is noted. Mild bilateral pleural effusions are noted which are increased compared to prior exam. No pneumothorax is noted. Atherosclerosis of thoracic aorta is noted. The visualized skeletal structures are unremarkable. IMPRESSION: Bilateral pulmonary edema with mild bilateral pleural effusions. Aortic Atherosclerosis (ICD10-I70.0). Electronically Signed   By: Marijo Conception, M.D.   On: 12/18/2017 15:10     Medications:   . sodium chloride 250 mL (12/19/17 1809)   . sodium chloride   Intravenous Once  . amLODipine  10 mg Oral Daily  . budesonide (PULMICORT) nebulizer solution  0.5 mg Nebulization BID  . calcium acetate  667 mg Oral With snacks   And  . calcium acetate  667 mg Oral TID WC  . Difluprednate  1 drop Left Eye BID  . epoetin (EPOGEN/PROCRIT) injection  10,000 Units Intravenous Q M,W,F-HD  . feeding supplement (NEPRO CARB STEADY)  237 mL Oral BID BM  . furosemide  80 mg Intravenous Once  . gabapentin  100 mg Oral BID  . insulin aspart  0-5 Units Subcutaneous QHS  . insulin aspart  0-9 Units Subcutaneous TID WC  . insulin detemir  12 Units Subcutaneous Q2200  . ipratropium-albuterol  3 mL Nebulization Q6H  . methylPREDNISolone (SOLU-MEDROL) injection  40 mg Intravenous Daily  . metoCLOPramide  5 mg Oral TID AC  . multivitamin  1 tablet Oral QHS  . multivitamin-lutein  1 capsule Oral Daily  . pravastatin  20 mg Oral q1800  . vitamin C  250 mg Oral BID   sodium chloride, acetaminophen, docusate sodium, fluticasone, lactulose, nystatin cream, Polyvinyl Alcohol-Povidone  PF, silver sulfADIAZINE, sodium chloride  Assessment/ Plan:  [name]. is a 72 y.o. black male with end stage renal disease on hemodialysis, CLL, diabetes mellitus type II, COPD, history of bladder cancer who is admitted to [facility] on 12/15/2017 for shortness of breath.   [facility] MWF [facility] AVF 90.5kg  1.  ESRD on HD MWF. Hemodialysis treatment yesterday. UF of 2 liters.  Continues to be short of breath. Crackles on examination - Extra hemodialysis treatment today. Orders prepared.   2.  Anemia of chronic kidney disease.  Hemoglobin 6.4 - PRBC transfusion yesterday, 9/18.    - EPO with HD treatment. IV venofer as outpatient.  - Appreciate hematology. - Discontinuing Auryxia.   3.  Secondary hyperparathyroidism with hyperphosphatemia: patient states he will take these  phos binders the way he wants.  - Discontinue Auryxia and lower dose of calcium acetate   4. Hypertension: blood pressure at goal.  - Discontinue amlodipine.    LOS: 5 Mark [name] 9/19/201911:34 AM

## 2017-12-20 NOTE — Progress Notes (Signed)
PT informed me that the patients sats were in the 70's.  RT notified.  Patient was placed on a non-rebreather at 69ml/hr.  sats currently at 99%

## 2017-12-20 NOTE — Progress Notes (Signed)
Patient titrated to 6L on HFNC, SAT of 96%. RN aware.

## 2017-12-20 NOTE — Progress Notes (Signed)
RT to patient bedside for scheduled breathing treatment. Patient found wiping blood from nose, states his nose has been bleeding off and on since his dialysis treatment. RN present and has informed MD. Patient states oxygen without humidification triggers nose bleeds. Patient states he has been blowing out blood clot from nose and periodic bleeding. Patient is currently on High Flow Bubble Nasal Canula with humidification at 10L, SAT of 98%, Clear diminished breath sounds. Titrating patient down on O2 requirements. RN aware. Will continue to monitor.

## 2017-12-20 NOTE — Progress Notes (Signed)
HD Treatment Initiated    12/20/17 1249  Vital Signs  Pulse Rate 88  Pulse Rate Source Monitor  Resp (!) 21  Oxygen Therapy  SpO2 94 %  O2 Device Venturi Mask  During Hemodialysis Assessment  Blood Flow Rate (mL/min) 400 mL/min  Arterial Pressure (mmHg) -170 mmHg  Venous Pressure (mmHg) 140 mmHg  Transmembrane Pressure (mmHg) 70 mmHg  Ultrafiltration Rate (mL/min) 1000 mL/min  Dialysate Flow Rate (mL/min) 600 ml/min  Conductivity: Machine  13.8  HD Safety Checks Performed Yes  Dialysis Fluid Bolus Normal Saline  Bolus Amount (mL) 250 mL  Intra-Hemodialysis Comments Tx initiated  Note  Observations  (AVF accessed)

## 2017-12-20 NOTE — Progress Notes (Signed)
Pre HD Tx  Assessed left AVF positive for thrill and bruit. No complications noted at site.    12/20/17 1230  Vital Signs  Temp 98.5 F (36.9 C)  Temp Source Oral  Pulse Rate 86  Pulse Rate Source Monitor  Resp 18  BP (!) 126/57  BP Location Right Leg  BP Method Automatic  Patient Position (if appropriate) Lying  Oxygen Therapy  SpO2 95 %  O2 Device Venturi Mask  Pain Assessment  Pain Scale 0-10  Pain Score 5  Pain Type Acute pain  Pain Location Buttocks  Pain Orientation Mid  Pain Descriptors / Indicators Aching  Pain Frequency Intermittent  Pain Onset Gradual  Patients Stated Pain Goal 0  Pain Intervention(s) Repositioned;Emotional support  Multiple Pain Sites No  Dialysis Weight  Weight 93.7 kg  Type of Weight Pre-Dialysis  Time-Out for Hemodialysis  What Procedure? HD  Pt Identifiers(min of two) First/Last Name;MRN/Account#;Pt's DOB(use if MRN/Acct# not available  Correct Site? Yes  Correct Side? Yes  Correct Procedure? Yes  Consents Verified? Yes  Rad Studies Available? N/A  Safety Precautions Reviewed? Yes  Engineer, civil (consulting) Number [phone]  Station Number 1  UF/Alarm Test Passed  Conductivity: Meter 13.8  Conductivity: Machine  13.5  pH 7.4  Reverse Osmosis Main  Normal Saline Lot Number [id number]  Dialyzer Lot Number [id number]  Disposable Set Lot Number [id number]  Machine Temperature 98.6 F (37 C)  Musician and Audible Yes  Blood Lines Intact and Secured Yes  Pre Treatment Patient Checks  Vascular access used during treatment Fistula  Hepatitis B Surface Antigen Results Negative  Date Hepatitis B Surface Antigen Drawn 11/26/17  Hepatitis B Surface Antibody 9  Date Hepatitis B Surface Antibody Drawn 04/30/17  Hemodialysis Consent Verified Yes  Hemodialysis Standing Orders Initiated Yes  ECG (Telemetry) Monitor On Yes  Prime Ordered Normal Saline  Length of  DialysisTreatment -hour(s) 2.5 Hour(s)  Dialyzer Elisio 17H NR  Dialysate  3K, 2.5 Ca  Dialysis Anticoagulant None  Dialysate Flow Ordered 600  Blood Flow Rate Ordered 400 mL/min  Ultrafiltration Goal 2 Liters  Pre Treatment Labs CBC;Other (Comment)  Dialysis Blood Pressure Support Ordered Normal Saline  Education / Care Plan  Dialysis Education Provided Yes  Documented Education in Care Plan Yes

## 2017-12-20 NOTE — Progress Notes (Signed)
Patient transported to dialysis

## 2017-12-20 NOTE — Progress Notes (Signed)
Patient ID: Mark [name]., male   DOB: 04-Dec-1945, 72 y.o.   MRN: [id number]  [employer] [facility] PROGRESS NOTE  Mark [name]. MGQ:[id number] DOB: 1945/07/02 DOA: 12/15/2017 PCP: Mark Billet, MD  HPI/Subjective: Called because of low pulse ox.  Initially they put him on 100% nonrebreather and was able to taper him down to a Ventimask.  Patient is a mouth breather so the high flow nasal cannula may not have been working as well as we would have liked.  Patient does have some shortness of breath and some cough.  Objective: Vitals:   12/20/17 1107 12/20/17 1110  BP:    Pulse:    Resp:    Temp:    SpO2: 99% 94%    Filed Weights   12/17/17 1405 12/19/17 0750 12/19/17 1200  Weight: 89.5 kg 85.3 kg 85.6 kg    ROS: Review of Systems  Constitutional: Negative for chills and fever.  Eyes: Negative for blurred vision.  Respiratory: Positive for cough and shortness of breath.   Cardiovascular: Negative for chest pain.  Gastrointestinal: Negative for abdominal pain, constipation, diarrhea, nausea and vomiting.  Genitourinary: Negative for dysuria.  Musculoskeletal: Negative for joint pain.  Neurological: Negative for dizziness and headaches.   Exam: Physical Exam  Constitutional: He is oriented to person, place, and time.  HENT:  Nose: No mucosal edema.  Mouth/Throat: No oropharyngeal exudate or posterior oropharyngeal edema.  Eyes: Pupils are equal, round, and reactive to light. Conjunctivae, EOM and lids are normal.  Neck: No JVD present. Carotid bruit is not present. No edema present. No thyroid mass and no thyromegaly present.  Cardiovascular: S1 normal, S2 normal and normal heart sounds. Exam reveals no gallop.  No murmur heard. Pulses:      Dorsalis pedis pulses are 1+ on the right side, and 1+ on the left side.  Respiratory: No respiratory distress. He has decreased breath sounds in the right middle field, the right lower field, the left middle field and the left  lower field. He has no wheezes. He has rhonchi in the right middle field and the left middle field. He has rales in the right lower field and the left lower field.  GI: Soft. Bowel sounds are normal. There is no tenderness.  Musculoskeletal:       Right ankle: He exhibits swelling.       Left ankle: He exhibits swelling.  Lymphadenopathy:    He has no cervical adenopathy.  Neurological: He is alert and oriented to person, place, and time. No cranial nerve deficit.  Skin: Skin is warm. Nails show no clubbing.  Chronic lower extremity skin discoloration  Psychiatric: He has a normal mood and affect.      Data Reviewed: Basic Metabolic Panel: Recent Labs  Lab 12/15/17 1748  NA 139  K 5.3*  CL 101  CO2 27  GLUCOSE 130*  BUN 39*  CREATININE 6.06*  CALCIUM 7.7*   Liver Function Tests: Recent Labs  Lab 12/15/17 1748  AST 25  ALT 26  ALKPHOS 72  BILITOT 1.0  PROT 6.2*  ALBUMIN 3.6   CBC: Recent Labs  Lab 12/15/17 1748 12/16/17 1005 12/17/17 1108  WBC 5.2 4.3 3.1*  NEUTROABS 3.5  --   --   HGB 6.6* 8.2* 6.4*  HCT 20.4* 24.2* 18.4*  MCV 99.2 96.9 95.4  PLT 117* 96* 92*    CBG: Recent Labs  Lab 12/18/17 2045 12/19/17 1328 12/19/17 1657 12/19/17 2048 12/20/17 0723  GLUCAP 211*  162* 284* 382* 283*    Recent Results (from the past 240 hour(s))  Culture, blood (Routine x 2)     Status: None   Collection Time: 12/15/17  5:48 PM  Result Value Ref Range Status   Specimen Description BLOOD RIGHT ANTECUBITAL  Final   Special Requests   Final    BOTTLES DRAWN AEROBIC AND ANAEROBIC Blood Culture results may not be optimal due to an excessive volume of blood received in culture bottles   Culture   Final    NO GROWTH 5 DAYS Performed at [facility], [address] [place], [place] [address]    Report Status 12/20/2017 FINAL  Final  Urine Culture     Status: Abnormal   Collection Time: 12/15/17  5:48 PM  Result Value Ref Range Status   Specimen  Description   Final    URINE, RANDOM Performed at [facility], [address], [place], [place] [address]    Special Requests   Final    NONE Performed at [facility], [address], [place], [place] [address]    Culture MULTIPLE SPECIES PRESENT, SUGGEST RECOLLECTION (A)  Final   Report Status 12/17/2017 FINAL  Final  Expectorated sputum assessment w rflx to resp cult     Status: None   Collection Time: 12/20/17  5:41 AM  Result Value Ref Range Status   Specimen Description SPUTUM  Final   Special Requests Normal  Final   Sputum evaluation   Final    THIS SPECIMEN IS ACCEPTABLE FOR SPUTUM CULTURE Performed at [facility], [address], [place],  [address]    Report Status 12/20/2017 FINAL  Final     Studies: Dg Chest Port 1 View  Result Date: 12/18/2017 CLINICAL DATA:  Shortness of breath. EXAM: PORTABLE CHEST 1 VIEW COMPARISON:  Radiograph of December 15, 2017. FINDINGS: The heart size and mediastinal contours are within normal limits. Stable bilateral pulmonary edema is noted. Mild bilateral pleural effusions are noted which are increased compared to prior exam. No pneumothorax is noted. Atherosclerosis of thoracic aorta is noted. The visualized skeletal structures are unremarkable. IMPRESSION: Bilateral pulmonary edema with mild bilateral pleural effusions. Aortic Atherosclerosis (ICD10-I70.0). Electronically Signed   By: Mark [name], M.D.   On: 12/18/2017 15:10    Scheduled Meds: . sodium chloride   Intravenous Once  . amLODipine  10 mg Oral Daily  . budesonide (PULMICORT) nebulizer solution  0.5 mg Nebulization BID  . calcium acetate  667 mg Oral With snacks   And  . calcium acetate  667 mg Oral TID WC  . Difluprednate  1 drop Left Eye BID  . epoetin (EPOGEN/PROCRIT) injection  10,000 Units Intravenous Q M,W,F-HD  . feeding supplement (NEPRO CARB STEADY)  237 mL Oral BID BM  . furosemide  80 mg Intravenous Once  . gabapentin   100 mg Oral BID  . insulin aspart  0-5 Units Subcutaneous QHS  . insulin aspart  0-9 Units Subcutaneous TID WC  . insulin detemir  12 Units Subcutaneous Q2200  . ipratropium-albuterol  3 mL Nebulization Q6H  . methylPREDNISolone (SOLU-MEDROL) injection  40 mg Intravenous Daily  . metoCLOPramide  5 mg Oral TID AC  . multivitamin  1 tablet Oral QHS  . multivitamin-lutein  1 capsule Oral Daily  . pravastatin  20 mg Oral q1800  . vitamin C  250 mg Oral BID   Continuous Infusions: . sodium chloride 250 mL (12/19/17 1809)  Assessment/Plan:  1. Acute hypoxic respiratory failure.  Patient on Ventimask now.  Patient's weight is up today 2 kg more than his dry weight.  Spoke with nephrology to do an extra dialysis today.  Nephrology also wanted me to get a pulmonary consultation.  Paging pulmonary now..  Yesterday's chest x-ray showing fluid.  Reviewed CT scan of the chest 12/11/2017 at [facility].  At that time had groundglass opacities on the chest.  Patient also on antibiotics and steroids. 2. Clinical sepsis with fever on admission.  So far blood cultures are negative.  Urine culture shows multiple organisms.  Patient placed on vancomycin and Rocephin.  Sputum culture pending. 3. End-stage renal disease on hemodialysis Monday Wednesday Friday.  Patient had dialysis today. 4. 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  Follow-up with Dr. Rogue [name] as outpatient. 5. Acute on chronic anemia.  Ferritin is elevated going along with anemia of chronic disease.  The patient has a history of bladder cancer.  Hold aspirin.  Patient received 3 units of packed red blood cells during hospital course.  Nephrology to send labs with dialysis today. 6. Hypertension on Norvasc 7. Type 2 diabetes mellitus on insulin and sliding scale 8. History of stroke.  Holding aspirin with anemia 9. Acute on chronic diastolic congestive heart failure.  Dialysis to remove fluid.  Echocardiogram greater than 55% EF done  this year.  Code Status:     Code Status Orders  (From admission, onward)         Start     Ordered   12/18/17 1441  Limited resuscitation (code)  Continuous    Question Answer Comment  In the event of cardiac or respiratory ARREST: Initiate Code Blue, Call Rapid Response No   In the event of cardiac or respiratory ARREST: Perform CPR No   In the event of cardiac or respiratory ARREST: Perform Intubation/Mechanical Ventilation No   In the event of cardiac or respiratory ARREST: Use NIPPV/BiPAp only if indicated No   In the event of cardiac or respiratory ARREST: Administer ACLS medications if indicated No   In the event of cardiac or respiratory ARREST: Perform Defibrillation or Cardioversion if indicated No   Comments would want cardioversion      12/18/17 1441        Code Status History    Date Active Date Inactive Code Status Order ID Comments User Context   01/15/2017 0423 01/18/2017 2120 Full Code [id number]  Saundra Shelling, MD Inpatient   10/20/2016 1627 10/22/2016 1817 Full Code [id number]  Lance Coon, MD Inpatient    Advance Directive Documentation     Most Recent Value  Type of Advance Directive  Healthcare Power of Mark [name], Living will  Pre-existing out of facility DNR order (yellow form or pink MOST form)  -  "MOST" Form in Place?  -     Family Communication: Family yesterday Disposition Plan: Respiratory status needs to improve prior to disposition  Consultants:  Nephrology  Palliative care  Antibiotics:  Rocephin  Vancomycin  Time spent: 29 minutes, in coordination of care and speaking with nursing staff nephrology and pharmacy.  Will also page pulmonary.  Marwin Primmer [employer]

## 2017-12-20 NOTE — Progress Notes (Signed)
Pre HD Assessment     12/20/17 1245  Neurological  Level of Consciousness Alert  Orientation Level Oriented X4  Respiratory  Respiratory Pattern Labored;Dyspnea with exertion;Symmetrical  Chest Assessment Chest expansion symmetrical  Bilateral Breath Sounds Coarse crackles  Cardiac  Pulse Regular  Heart Sounds S1, S2  Jugular Venous Distention (JVD) No  ECG Monitor Yes  Cardiac Rhythm NSR  Vascular  R Radial Pulse +2  L Radial Pulse +2  R Dorsalis Pedis Pulse +1  L Dorsalis Pedis Pulse +1  Edema Generalized  Generalized Edema None  Integumentary  Integumentary (WDL) X  Skin Color Appropriate for ethnicity  Skin Condition Dry  Musculoskeletal  Musculoskeletal (WDL) X  Generalized Weakness Yes  GU Assessment  Genitourinary (WDL) X (HD pt)  Psychosocial  Psychosocial (WDL) WDL

## 2017-12-20 NOTE — Progress Notes (Signed)
Date: 12/20/2017,   MRN# [id number] Mark Kaeser Zavaleta Jr. Oct 28, 1945 Code Status:     Code Status Orders  (From admission, onward)         Start     Ordered   12/18/17 1441  Limited resuscitation (code)  Continuous    Question Answer Comment  In the event of cardiac or respiratory ARREST: Initiate Code Blue, Call Rapid Response No   In the event of cardiac or respiratory ARREST: Perform CPR No   In the event of cardiac or respiratory ARREST: Perform Intubation/Mechanical Ventilation No   In the event of cardiac or respiratory ARREST: Use NIPPV/BiPAp only if indicated No   In the event of cardiac or respiratory ARREST: Administer ACLS medications if indicated No   In the event of cardiac or respiratory ARREST: Perform Defibrillation or Cardioversion if indicated No   Comments would want cardioversion      12/18/17 1441        Code Status History    Date Active Date Inactive Code Status Order ID Comments User Context   01/15/2017 0423 01/18/2017 2120 Full Code [id number]  Saundra Shelling, MD Inpatient   10/20/2016 1627 10/22/2016 1817 Full Code [id number]  Lance Coon, MD Inpatient    Advance Directive Documentation     Most Recent Value  Type of Advance Directive  Healthcare Power of [place], Living will  Pre-existing out of facility DNR order (yellow form or pink MOST form)  -  "MOST" Form in Place?  -     Hosp day:@LENGTHOFSTAYDAYS @ Referring MD: @ATDPROV @      CC: Sob, hypoxia abnormal chest xray  HPI: this is a pleasant 72 year old, african [place] male. Hx of CLL, DM 2, COPD, ESRD on HD MWF, bladder cancer, HTN, secondary hyperthyroidism and anemia,   received blood transfusions since being here. Renal, hematology following. Asked to see regarding sob, low sats, bilateral effusions, bilateral interstitial infiltrates.   --Diffuse ground glass opacities with scattered nodular opacities, greater on the left and concerning for infection. Pulmonary edema could have a similar  appearance. Follow-up CT thorax recommended in 6 weeks to ensure clearing.  --Small pleural effusions with associated passive atelectasis, greater on the right.  --Mediastinal, axillary, and cervical lymphadenopathy as above, stable to minimally decreased from prior and compatible with known history of lymphoma.  ==================== ADDENDUM (12/11/2017 9:34 AM):  On review, the following additional findings were noted:  2.3 cm right periareolar subcutaneous soft tissue lesion of uncertain etiology, new from prior CT. Recommend visual inspection. Nonemergent mammography on an outpatient basis also recommended for further evaluation.  Result Narrative  EXAM: CT CHEST WO CONTRAST DATE: 12/10/2017 9:23 PM ACCESSION: [id number] UN DICTATED: 12/10/2017 10:11 PM INTERPRETATION LOCATION: [place]  CLINICAL INDICATION: 72 years old Male with Shortness of Breath, leukopenia  COMPARISON: Same day chest radiograph and earlier, CT chest 04/22/2017  TECHNIQUE: A spiral CT scan was obtained without IV contrast from the thoracic inlet through the hemidiaphragms. Images were reconstructed in the axial plane.Coronal and sagittal reformatted images of the chest were also provided for further evaluation of the lung parenchyma.  FINDINGS:   AIRWAYS, LUNGS, PLEURA: Minimal dependent debris within the trachea.   Diffuse groundglass opacities throughout the left greater than right lung, with scattered nodular opacities.  Small right greater than left pleural effusions with associated passive atelectasis.  MEDIASTINUM: Mild cardiomegaly. No pericardial effusion. Coronary artery calcifications. Normal caliber thoracic aorta. Atherosclerotic calcific calcifications about the aortic arch.   Multiple mediastinal and partially  visualized cervical lymph nodes, grossly unchanged from prior, for reference: -AP window lymph node measuring up to 2.9 cm (4:38), previously 3.0 cm. -Right upper paratracheal lymph  node measuring up to 2.1 cm (4:22), previously 2.4 cm. -Prevascular lymph nodes measuring up to 1.9 cm (4:36), previously 1.8 cm. -Left lower paratracheal/paraesophageal lymph node measuring up to 1.8 cm (4:38), previously 1.9 cm.  IMAGED ABDOMEN: Bilateral perinephric stranding. 0.3 cm left upper pole renal calculus. Bilateral renal cysts.  SOFT TISSUES: Right periareolar subcutaneous soft tissue lesion measures 2.3 cm (4:41). Numerous prominent axillary lymph nodes, largest on the left measuring up to 1.4 cm (4:19) and largest on the right measuring up to 1.4 cm (4:28).  BONES: Moderate multilevel degenerative changes in the visualized spine. No acute osseous abnormality.  Other Result Information  I    Result Narrative   Technically difficult study due to chest wall/lung interference  Ultrasound enhancing agent utilized to improve endocardial border  definition  Normal left ventricular systolic function, ejection fraction > 45%  Diastolic dysfunction - grade II (elevated filling pressures)  Dilated left atrium - mild  Normal right ventricular systolic function      PMHX:   Past Medical History:  Diagnosis Date  . Anemia   . Arthritis   . Bladder cancer ([place])   . Chronic kidney disease   . Diabetes mellitus without complication ([place])   . Dialysis patient [facility])    M,W,F  . Dyspnea    DOE  . GERD (gastroesophageal reflux disease)   . HOH (hard of hearing)   . Hypertension   . IBS (irritable bowel syndrome)   . Lymphoma ([place]) 09/27/2014  . Neuropathy    RIGHT LEG  . Stroke [facility])    TIA   Surgical Hx:  Past Surgical History:  Procedure Laterality Date  . BACK SURGERY  2007  . CATARACT EXTRACTION W/PHACO Left 03/23/2016   Procedure: CATARACT EXTRACTION PHACO AND INTRAOCULAR LENS PLACEMENT (IOC);  Surgeon: Leandrew Koyanagi, MD;  Location: [facility] ORS;  Service: Ophthalmology;  Laterality: Left;  [place] 52.6 AP% 14.7 CDE 7.72 Fluid pack lot # [id number]  . CATARACT  EXTRACTION W/PHACO Right 05/30/2016   Procedure: CATARACT EXTRACTION PHACO AND INTRAOCULAR LENS PLACEMENT ([place]);  Surgeon: Leandrew Koyanagi, MD;  Location: [facility] ORS;  Service: Ophthalmology;  Laterality: Right;  [place] 01:08 AP% 13.9 CDE 9.53  note: could not get IV in patient, so procedure done without anesthesia personell present, ok per Dr Wallace Going fluid pack lo t# [id number]  . CIRCUMCISION    . PERIPHERAL VASCULAR CATHETERIZATION Left 08/19/2015   Procedure: A/V Shuntogram/Fistulagram;  Surgeon: Algernon Huxley, MD;  Location: [place] CV LAB;  Service: Cardiovascular;  Laterality: Left;  . PERIPHERAL VASCULAR CATHETERIZATION N/A 08/19/2015   Procedure: A/V Shunt Intervention;  Surgeon: Algernon Huxley, MD;  Location: [place] CV LAB;  Service: Cardiovascular;  Laterality: N/A;   Family Hx:  Family History  Problem Relation Age of Onset  . Cancer Mother   . Kidney cancer Neg Hx   . Prostate cancer Neg Hx   . Kidney failure Neg Hx   . Bladder Cancer Neg Hx    Social Hx:   Social History   Tobacco Use  . Smoking status: Former Smoker    Types: Cigarettes    Last attempt to quit: 05/19/2013    Years since quitting: 4.5  . Smokeless tobacco: Never Used  . Tobacco comment: quit  Substance Use Topics  . Alcohol use: No  Alcohol/week: 0.0 standard drinks  . Drug use: No   Medication:    Home Medication:    Current Medication: @CURMEDTAB @   Allergies:  Heparin; Ibuprofen; Multivitamin [centrum]; Daypro [oxaprozin]; and Tape  Review of Systems: Gen:  Denies  fever, sweats, chills HEENT: Denies blurred vision, double vision, ear pain, eye pain, hearing loss, + nose bleeds,- sore throat Cvc:  No dizziness, chest pain or heaviness Resp:  + shortness of breath, no cough, hemoptysis  Gi: Denies swallowing difficulty, stomach pain, nausea or vomiting, diarrhea, constipation, bowel incontinence Gu:  Denies bladder incontinence, burning urine Ext:   No Joint pain, stiffness  or swelling Skin: No skin rash, easy bruising or bleeding or hives Endoc:  No polyuria, polydipsia , polyphagia or weight change Psych: No depression, insomnia or hallucinations  Other:  All other systems negative  Physical Examination:   VS: BP (!) 113/93 (BP Location: Right Leg)   Pulse 77   Temp 98.3 F (36.8 C) (Oral)   Resp 18   Ht 5\' 11"  (1.803 m)   Wt 91.8 kg   SpO2 97%   BMI 28.23 kg/m   General Appearance: mild distress  Neuro: without focal findings, mental status, speech normal, alert and oriented, cranial nerves 2-12 intact, reflexes normal and symmetric, sensation grossly normal  HEENT: PERRLA, EOM intact, no ptosis, no other lesions noticed, nares is packed Pulmonary:.No wheezing, No rales  Basal dullness, no wheezinhg   Cardiovascular:  Normal S1,S2.  No m/r/g.  Abdominal aorta pulsation normal.    Abdomen:Benign, Soft, non-tender, No masses, hepatosplenomegaly, No lymphadenopathy Endoc: No evident thyromegaly, no signs of acromegaly or Cushing features Skin:   warm, no rashes, no ecchymosis  Extremities: normal, no cyanosis, clubbing, no edema, warm with normal capillary refill.   Labs results:   Recent Labs    12/20/17 1249  HGB 8.4*  HCT 24.9*  MCV 93.2  WBC 5.5  BUN 54*  CREATININE 5.57*  GLUCOSE 452*  CALCIUM 7.4*  , CLINICAL DATA:  Shortness of breath.  EXAM: PORTABLE CHEST 1 VIEW  COMPARISON:  Radiograph of December 15, 2017.  FINDINGS: The heart size and mediastinal contours are within normal limits. Stable bilateral pulmonary edema is noted. Mild bilateral pleural effusions are noted which are increased compared to prior exam. No pneumothorax is noted. Atherosclerosis of thoracic aorta is noted. The visualized skeletal structures are unremarkable.  IMPRESSION: Bilateral pulmonary edema with mild bilateral pleural effusions.  Aortic Atherosclerosis (ICD10-I70.0).   Electronically Signed   By: Marijo Conception, M.D.   On:  12/18/2017 15:10  IMPRESSION: 1. Further enlargement of pathologic adenopathy in the neck, chest, abdomen, and pelvis, but with similar [place] 4 level of metabolic activity compared to the prior exam, compatible with active lymphoma. No bony involvement or splenomegaly identified. 2. 6 by 4 mm left upper lobe pulmonary nodule appears to be new and does not have hypermetabolic activity currently, surveillance suggested. 3. Several findings of indeterminate significance include some bandlike probable atelectasis or scarring medially in the right lower lobe with faintly accentuated activity, as well as some focal subcutaneous activity in stranding in the right lower quadrant. 4. Other imaging findings of potential clinical significance: Aortic Atherosclerosis (ICD10-I70.0). Right renal cysts. Coronary atherosclerosis.   Electronically Signed   By: Van Clines M.D.   On: 11/30/2016 14:42 Result Impression  --Patchy groundglass opacities in bilateral lung parenchyma with moderate right and trace left pleural effusion; this may represent pulmonary edema.  --Scattered nodular opacities in  the bilateral lung parenchyma; this may represent aspiration and/or infection. -- Prominent mediastinal, axillary, and partially visualized cervical lymphadenopathy as described above, consistent with known history of lymphoma.  Result Narrative  EXAM: CT Chest High Resolution without contrast DATE: 04/22/2017 8:56 PM ACCESSION: [id number] UN DICTATED: 04/23/2017 10:12 AM INTERPRETATION LOCATION: [place] and Plan: This is a pleasant male, here with dyspnea, on high fio2, chest xray and ct as per above, thus far I suspect this fluid over load ( bilateral pl effusions and pulmonary edema, diastolic dysfunction). There could be associated pneumonia, on treatment. Past hx of lymphoma. ? Lymphogenic spread of tumor is also part of the differential. His sob is also related to  copd -Oxygen, keep sats ? 92 % -Hemodialysis with extra fluid removal as renal is doing.  -Emperic antibiotics as you are doing -duo nebs -check ace, sed arte,  -reasess in am   I have personally obtained a history, examined the patient, evaluated laboratory and imaging results, formulated the assessment and plan and placed orders.  The Patient requires high complexity decision making for assessment and support, frequent evaluation and titration of therapies, application of advanced monitoring technologies and extensive interpretation of multiple databases.   Herbon Fleming,M.D. Pulmonary Medicine [facility]

## 2017-12-20 NOTE — Plan of Care (Signed)
Patient on high flow nasal cannula over night. Constant nose bleed over the night. Notified MD and he ordered Tranexamic Acid solution to be packed in nares.

## 2017-12-21 ENCOUNTER — Ambulatory Visit: Payer: Medicare HMO | Admitting: Internal Medicine

## 2017-12-21 DIAGNOSIS — R0602 Shortness of breath: Secondary | ICD-10-CM

## 2017-12-21 LAB — HAPTOGLOBIN: Haptoglobin: 198 mg/dL (ref 34–200)

## 2017-12-21 LAB — CBC
HCT: 23.3 % — ABNORMAL LOW (ref 40.0–52.0)
Hemoglobin: 8.1 g/dL — ABNORMAL LOW (ref 13.0–18.0)
MCH: 32.5 pg (ref 26.0–34.0)
MCHC: 34.9 g/dL (ref 32.0–36.0)
MCV: 93.1 fL (ref 80.0–100.0)
PLATELETS: 123 10*3/uL — AB (ref 150–440)
RBC: 2.5 MIL/uL — ABNORMAL LOW (ref 4.40–5.90)
RDW: 18.4 % — AB (ref 11.5–14.5)
WBC: 6.2 10*3/uL (ref 3.8–10.6)

## 2017-12-21 LAB — RENAL FUNCTION PANEL
Albumin: 3.3 g/dL — ABNORMAL LOW (ref 3.5–5.0)
Anion gap: 9 (ref 5–15)
BUN: 31 mg/dL — AB (ref 8–23)
CHLORIDE: 96 mmol/L — AB (ref 98–111)
CO2: 32 mmol/L (ref 22–32)
CREATININE: 2.91 mg/dL — AB (ref 0.61–1.24)
Calcium: 7.8 mg/dL — ABNORMAL LOW (ref 8.9–10.3)
GFR calc non Af Amer: 20 mL/min — ABNORMAL LOW (ref >60)
GFR, EST AFRICAN AMERICAN: 23 mL/min — AB (ref >60)
Glucose, Bld: 235 mg/dL — ABNORMAL HIGH (ref 70–99)
Phosphorus: 1.7 mg/dL — ABNORMAL LOW (ref 2.5–4.6)
Potassium: 3.9 mmol/L (ref 3.5–5.1)
Sodium: 137 mmol/L (ref 135–145)

## 2017-12-21 LAB — GLUCOSE, CAPILLARY
GLUCOSE-CAPILLARY: 183 mg/dL — AB (ref 70–99)
GLUCOSE-CAPILLARY: 344 mg/dL — AB (ref 70–99)
GLUCOSE-CAPILLARY: 477 mg/dL — AB (ref 70–99)
Glucose-Capillary: 445 mg/dL — ABNORMAL HIGH (ref 70–99)
Glucose-Capillary: 237 mg/dL — ABNORMAL HIGH (ref 70–99)
Glucose-Capillary: 414 mg/dL — ABNORMAL HIGH (ref 70–99)

## 2017-12-21 MED ORDER — INSULIN DETEMIR 100 UNIT/ML ~~LOC~~ SOLN
18.0000 [IU] | Freq: Every day | SUBCUTANEOUS | Status: DC
  Administered 2017-12-21 – 2017-12-22 (×2): 18 [IU] via SUBCUTANEOUS
  Filled 2017-12-21 (×3): qty 0.18

## 2017-12-21 MED ORDER — INSULIN ASPART 100 UNIT/ML ~~LOC~~ SOLN
7.0000 [IU] | Freq: Once | SUBCUTANEOUS | Status: AC
  Administered 2017-12-21: 7 [IU] via SUBCUTANEOUS
  Filled 2017-12-21: qty 1

## 2017-12-21 MED ORDER — INSULIN ASPART 100 UNIT/ML ~~LOC~~ SOLN
7.0000 [IU] | Freq: Three times a day (TID) | SUBCUTANEOUS | Status: DC
  Administered 2017-12-21 – 2017-12-23 (×6): 7 [IU] via SUBCUTANEOUS
  Filled 2017-12-21 (×5): qty 1
  Filled 2017-12-21: qty 0.07
  Filled 2017-12-21: qty 1

## 2017-12-21 MED ORDER — OXYMETAZOLINE HCL 0.05 % NA SOLN
3.0000 | Freq: Two times a day (BID) | NASAL | Status: DC
  Administered 2017-12-21 – 2017-12-23 (×5): 3 via NASAL
  Filled 2017-12-21: qty 15

## 2017-12-21 MED ORDER — AMLODIPINE BESYLATE 10 MG PO TABS
10.0000 mg | ORAL_TABLET | Freq: Every day | ORAL | Status: DC
  Administered 2017-12-21 – 2017-12-23 (×3): 10 mg via ORAL
  Filled 2017-12-21 (×3): qty 1

## 2017-12-21 NOTE — Progress Notes (Signed)
This note also relates to the following rows which could not be included: Pulse Rate - Cannot attach notes to unvalidated device data Resp - Cannot attach notes to unvalidated device data BP - Cannot attach notes to unvalidated device data  Hd completed  

## 2017-12-21 NOTE — Progress Notes (Addendum)
Patient ID: Mark Skeeter., male   DOB: 27-Feb-1946, 72 y.o.   MRN: [id number]  [employer] PROGRESS NOTE  Mark Alstrom [name]. JWJ:[id number] DOB: 09/09/1945 DOA: 12/15/2017 PCP: Albina Billet, MD  HPI/Subjective: Patient feels a little bit better with regards to his breathing.  States he felt dizzy and lightheaded after I gave him the Solu-Medrol.  Had a nosebleed last night but it stopped and its clotted up.  States he cannot breathe through his left nostril.  Objective: Vitals:   12/21/17 1330 12/21/17 1343  BP:  (!) 119/97  Pulse:  81  Resp:  18  Temp: (P) 97.7 F (36.5 C)   SpO2:      Filed Weights   12/20/17 1540 12/21/17 0608 12/21/17 0955  Weight: 91.8 kg 91.3 kg 91.2 kg    ROS: Review of Systems  Constitutional: Negative for chills and fever.  Eyes: Negative for blurred vision.  Respiratory: Positive for cough and shortness of breath.   Cardiovascular: Negative for chest pain.  Gastrointestinal: Negative for abdominal pain, constipation, diarrhea, nausea and vomiting.  Genitourinary: Negative for dysuria.  Musculoskeletal: Negative for joint pain.  Neurological: Negative for dizziness and headaches.   Exam: Physical Exam  Constitutional: He is oriented to person, place, and time.  HENT:  Nose: No mucosal edema.  Mouth/Throat: No oropharyngeal exudate or posterior oropharyngeal edema.  Eyes: Pupils are equal, round, and reactive to light. Conjunctivae, EOM and lids are normal.  Neck: No JVD present. Carotid bruit is not present. No edema present. No thyroid mass and no thyromegaly present.  Cardiovascular: S1 normal, S2 normal and normal heart sounds. Exam reveals no gallop.  No murmur heard. Pulses:      Dorsalis pedis pulses are 1+ on the right side, and 1+ on the left side.  Respiratory: No respiratory distress. He has decreased breath sounds in the right lower field and the left lower field. He has no wheezes. He has no rhonchi. He has rales in the  right lower field and the left lower field.  GI: Soft. Bowel sounds are normal. There is no tenderness.  Musculoskeletal:       Right ankle: He exhibits swelling.       Left ankle: He exhibits swelling.  Lymphadenopathy:    He has no cervical adenopathy.  Neurological: He is alert and oriented to person, place, and time. No cranial nerve deficit.  Skin: Skin is warm. Nails show no clubbing.  Chronic lower extremity skin discoloration  Psychiatric: He has a normal mood and affect.      Data Reviewed: Basic Metabolic Panel: Recent Labs  Lab 12/15/17 1748 12/20/17 1249 12/21/17 1105  NA 139 130* 137  K 5.3* 4.3 3.9  CL 101 89* 96*  CO2 27 28 32  GLUCOSE 130* 452* 235*  BUN 39* 54* 31*  CREATININE 6.06* 5.57* 2.91*  CALCIUM 7.7* 7.4* 7.8*  PHOS  --  2.6 1.7*   Liver Function Tests: Recent Labs  Lab 12/15/17 1748 12/20/17 1249 12/21/17 1105  AST 25  --   --   ALT 26  --   --   ALKPHOS 72  --   --   BILITOT 1.0  --   --   PROT 6.2*  --   --   ALBUMIN 3.6 3.3* 3.3*   CBC: Recent Labs  Lab 12/15/17 1748 12/16/17 1005 12/17/17 1108 12/20/17 1249 12/21/17 1105  WBC 5.2 4.3 3.1* 5.5 6.2  NEUTROABS 3.5  --   --   --   --  HGB 6.6* 8.2* 6.4* 8.4* 8.1*  HCT 20.4* 24.2* 18.4* 24.9* 23.3*  MCV 99.2 96.9 95.4 93.2 93.1  PLT 117* 96* 92* 102* 123*    CBG: Recent Labs  Lab 12/20/17 0723 12/20/17 1134 12/20/17 1637 12/20/17 1922 12/21/17 0730  GLUCAP 283* 460* 218* 357* 237*    Recent Results (from the past 240 hour(s))  Culture, blood (Routine x 2)     Status: None   Collection Time: 12/15/17  5:48 PM  Result Value Ref Range Status   Specimen Description BLOOD RIGHT ANTECUBITAL  Final   Special Requests   Final    BOTTLES DRAWN AEROBIC AND ANAEROBIC Blood Culture results may not be optimal due to an excessive volume of blood received in culture bottles   Culture   Final    NO GROWTH 5 DAYS Performed at [facility], [address]  [place], [place] [address]    Report Status 12/20/2017 FINAL  Final  Urine Culture     Status: Abnormal   Collection Time: 12/15/17  5:48 PM  Result Value Ref Range Status   Specimen Description   Final    URINE, RANDOM Performed at [facility], [address] [place], [place] [address]    Special Requests   Final    NONE Performed at [facility], [address] [place], [place] [address]    Culture MULTIPLE SPECIES PRESENT, SUGGEST RECOLLECTION (A)  Final   Report Status 12/17/2017 FINAL  Final  Expectorated sputum assessment w rflx to resp cult     Status: None   Collection Time: 12/20/17  5:41 AM  Result Value Ref Range Status   Specimen Description SPUTUM  Final   Special Requests Normal  Final   Sputum evaluation   Final    THIS SPECIMEN IS ACCEPTABLE FOR SPUTUM CULTURE Performed at [facility], [address], [place], [place] [address]    Report Status 12/20/2017 FINAL  Final  Culture, respiratory     Status: None (Preliminary result)   Collection Time: 12/20/17  5:41 AM  Result Value Ref Range Status   Specimen Description   Final    SPUTUM Performed at [facility], [address], [place], [place] [address]    Special Requests   Final    Normal Reflexed from [id number] Performed at [facility], [address] [place],  [address]    Gram Stain   Final    FEW WBC PRESENT, PREDOMINANTLY PMN MODERATE GRAM NEGATIVE RODS MODERATE GRAM POSITIVE COCCI    Culture   Final    CULTURE REINCUBATED FOR BETTER GROWTH Performed at [facility], [place] [address] [place],  [address]    Report Status PENDING  Incomplete     Studies: No results found.  Scheduled Meds: . sodium chloride   Intravenous Once  . budesonide (PULMICORT) nebulizer solution  0.5 mg Nebulization BID  . calcium acetate  667 mg Oral With snacks   And  . calcium acetate  667 mg Oral TID WC  . Difluprednate  1 drop Left Eye BID  .  epoetin (EPOGEN/PROCRIT) injection  10,000 Units Intravenous Q M,W,F-HD  . feeding supplement (NEPRO CARB STEADY)  237 mL Oral BID BM  . furosemide  80 mg Intravenous Once  . gabapentin  100 mg Oral BID  . insulin aspart  0-5 Units Subcutaneous QHS  . insulin aspart  0-9 Units Subcutaneous TID WC  . insulin aspart  7 Units  Subcutaneous TID WC  . insulin detemir  18 Units Subcutaneous Q2200  . ipratropium-albuterol  3 mL Nebulization Q6H  . methylPREDNISolone (SOLU-MEDROL) injection  40 mg Intravenous Daily  . metoCLOPramide  5 mg Oral TID AC  . multivitamin  1 tablet Oral QHS  . multivitamin-lutein  1 capsule Oral Daily  . pravastatin  20 mg Oral q1800  . vitamin C  250 mg Oral BID   Continuous Infusions: . sodium chloride 250 mL (12/19/17 1809)    Assessment/Plan:  1. Acute hypoxic respiratory failure.  Patient down on oxygen requirements from yesterday.  Patient had an extra dialysis yesterday which helped out with removing fluid.  Reviewed CT scan of the chest 12/11/2017 at [facility]  At that time had groundglass opacities on the chest.  Patient also on antibiotics and steroids.  Likely this is fluid overload from 3 blood transfusions.  Previous echocardiogram EF greater than 55%. 2. Clinical sepsis with fever on admission.  So far blood cultures are negative.  Urine culture shows multiple organisms.  Patient placed on vancomycin and Rocephin.  Sputum culture pending. 3. End-stage renal disease on hemodialysis Monday Wednesday Friday.  Patient had extra dialysis yesterday.  On normal dialysis today. 4. 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  Follow-up with Dr. Rogue Bussing as outpatient. 5. Acute on chronic anemia.  Ferritin is elevated going along with anemia of chronic disease.  The patient has a history of bladder cancer.  Hold aspirin.  Patient received 3 units of packed red blood cells during hospital course.  Last hemoglobin 8.2 6. Hypertension on Norvasc 7. Type 2  diabetes mellitus on insulin and sliding scale 8. History of stroke.  Holding aspirin with anemia 9. Acute on chronic diastolic congestive heart failure.  Dialysis to remove fluid.  Echocardiogram greater than 55% EF done this year. 10. Weakness.  Patient does not like to walk with physical therapy on dialysis days. 11. Epistaxis.  Humidified oxygen.  Afrin nasal spray.  If it happens again will need to get a nose clip from the ER.  And apply ice.  Code Status:     Code Status Orders  (From admission, onward)         Start     Ordered   12/18/17 1441  Limited resuscitation (code)  Continuous    Question Answer Comment  In the event of cardiac or respiratory ARREST: Initiate Code Blue, Call Rapid Response No   In the event of cardiac or respiratory ARREST: Perform CPR No   In the event of cardiac or respiratory ARREST: Perform Intubation/Mechanical Ventilation No   In the event of cardiac or respiratory ARREST: Use NIPPV/BiPAp only if indicated No   In the event of cardiac or respiratory ARREST: Administer ACLS medications if indicated No   In the event of cardiac or respiratory ARREST: Perform Defibrillation or Cardioversion if indicated No   Comments would want cardioversion      12/18/17 1441        Code Status History    Date Active Date Inactive Code Status Order ID Comments User Context   01/15/2017 0423 01/18/2017 2120 Full Code [id number]  Saundra Shelling, MD Inpatient   10/20/2016 1627 10/22/2016 1817 Full Code [id number]  Lance Coon, MD Inpatient    Advance Directive Documentation     Most Recent Value  Type of Advance Directive  Healthcare Power of Attorney, Living will  Pre-existing out of facility DNR order (yellow form or pink MOST form)  -  "  MOST" Form in Place?  -     Disposition Plan: Respiratory status needs to improve prior to disposition  Consultants:  Nephrology  Palliative care  Antibiotics:  Rocephin  Vancomycin  Time spent: 28  minutes  [place]

## 2017-12-21 NOTE — Care Management (Signed)
Barriers:  Patient still requiring increased O2 requirement . 5 liters at this time.  Due to respiratory status PT has been unable to work with patient, or patient has been off the floor. Last PT session was 9/15.  Patient will also need to be able to tolerate sitting for HD prior to discharge.

## 2017-12-21 NOTE — Progress Notes (Signed)
[facility]  ROUNDING NOTE   Subjective:   Extra hemodialysis treatment yesterday. UF of 1.8 liters. Patient states his breathing has improved.   Pulmonary consult believes shortness of breath is due to fluid overload/pulmonary edema.   Objective:  Vital signs in last 24 hours:  Temp:  [97.5 F (36.4 C)-98.6 F (37 C)] 97.7 F (36.5 C) (09/20 0754) Pulse Rate:  [72-96] 78 (09/20 0754) Resp:  [14-26] 18 (09/20 0754) BP: (81-157)/(31-131) 152/69 (09/20 0754) SpO2:  [77 %-100 %] 92 % (09/20 0754) FiO2 (%):  [50 %] 50 % (09/19 1134) Weight:  [91.3 kg-93.7 kg] 91.3 kg (09/20 0608)  Weight change: 6.9 kg Filed Weights   12/20/17 1230 12/20/17 1540 12/21/17 0608  Weight: 93.7 kg 91.8 kg 91.3 kg    Intake/Output: I/O last 3 completed shifts: In: 360 [P.O.:360] Out: 1832 [Other:1832]   Intake/Output this shift:  No intake/output data recorded.  Physical Exam: General: No acute distress  Head: Normocephalic, atraumatic. Moist oral mucosal membranes  Eyes: Anicteric  Neck: Supple, trachea midline  Lungs:  Crackles at bases, [place] O2   Heart: S1S2 no rubs  Abdomen:  Soft, nontender, bowel sounds present  Extremities: No peripheral edema.  Neurologic: Awake, alert, following commands  Skin: No lesions  Access: LUE AVF    Basic Metabolic Panel: Recent Labs  Lab 12/15/17 1748 12/20/17 1249  NA 139 130*  K 5.3* 4.3  CL 101 89*  CO2 27 28  GLUCOSE 130* 452*  BUN 39* 54*  CREATININE 6.06* 5.57*  CALCIUM 7.7* 7.4*  PHOS  --  2.6    Liver Function Tests: Recent Labs  Lab 12/15/17 1748 12/20/17 1249  AST 25  --   ALT 26  --   ALKPHOS 72  --   BILITOT 1.0  --   PROT 6.2*  --   ALBUMIN 3.6 3.3*   No results for input(s): LIPASE, AMYLASE in the last 168 hours. No results for input(s): AMMONIA in the last 168 hours.  CBC: Recent Labs  Lab 12/15/17 1748 12/16/17 1005 12/17/17 1108 12/20/17 1249  WBC 5.2 4.3 3.1* 5.5  NEUTROABS 3.5  --   --    --   HGB 6.6* 8.2* 6.4* 8.4*  HCT 20.4* 24.2* 18.4* 24.9*  MCV 99.2 96.9 95.4 93.2  PLT 117* 96* 92* 102*    Cardiac Enzymes: No results for input(s): CKTOTAL, CKMB, CKMBINDEX, TROPONINI in the last 168 hours.  BNP: Invalid input(s): POCBNP  CBG: Recent Labs  Lab 12/20/17 0723 12/20/17 1134 12/20/17 1637 12/20/17 1922 12/21/17 0730  GLUCAP 283* 460* 218* 357* 56*    Microbiology: Results for orders placed or performed during the hospital encounter of 12/15/17  Culture, blood (Routine x 2)     Status: None   Collection Time: 12/15/17  5:48 PM  Result Value Ref Range Status   Specimen Description BLOOD RIGHT ANTECUBITAL  Final   Special Requests   Final    BOTTLES DRAWN AEROBIC AND ANAEROBIC Blood Culture results may not be optimal due to an excessive volume of blood received in culture bottles   Culture   Final    NO GROWTH 5 DAYS Performed at [facility], [address] [place], [place] [address]    Report Status 12/20/2017 FINAL  Final  Urine Culture     Status: Abnormal   Collection Time: 12/15/17  5:48 PM  Result Value Ref Range Status   Specimen Description   Final    URINE,  RANDOM Performed at [facility], [address] [place], [place] [address]    Special Requests   Final    NONE Performed at [facility], [address] [place], [place] [address]    Culture MULTIPLE SPECIES PRESENT, SUGGEST RECOLLECTION (A)  Final   Report Status 12/17/2017 FINAL  Final  Expectorated sputum assessment w rflx to resp cult     Status: None   Collection Time: 12/20/17  5:41 AM  Result Value Ref Range Status   Specimen Description SPUTUM  Final   Special Requests Normal  Final   Sputum evaluation   Final    THIS SPECIMEN IS ACCEPTABLE FOR SPUTUM CULTURE Performed at [facility], [address], [place], [place] [address]    Report Status 12/20/2017 FINAL  Final  Culture, respiratory     Status: None (Preliminary result)    Collection Time: 12/20/17  5:41 AM  Result Value Ref Range Status   Specimen Description   Final    SPUTUM Performed at [facility], [address] [place], [place] [address]    Special Requests   Final    Normal Reflexed from [phone] Performed at [facility], [address] [place], [place] [address]    Gram Stain   Final    FEW WBC PRESENT, PREDOMINANTLY PMN MODERATE GRAM NEGATIVE RODS MODERATE GRAM POSITIVE COCCI    Culture   Final    CULTURE REINCUBATED FOR BETTER GROWTH Performed at [facility], [place] [address] [place], [place] [address]    Report Status PENDING  Incomplete    Coagulation Studies: No results for input(s): LABPROT, INR in the last 72 hours.  Urinalysis: No results for input(s): COLORURINE, LABSPEC, PHURINE, GLUCOSEU, HGBUR, BILIRUBINUR, KETONESUR, PROTEINUR, UROBILINOGEN, NITRITE, LEUKOCYTESUR in the last 72 hours.  Invalid input(s): APPERANCEUR    Imaging: No results found.   Medications:   . sodium chloride 250 mL (12/19/17 1809)   . sodium chloride   Intravenous Once  . budesonide (PULMICORT) nebulizer solution  0.5 mg Nebulization BID  . calcium acetate  667 mg Oral With snacks   And  . calcium acetate  667 mg Oral TID WC  . Difluprednate  1 drop Left Eye BID  . epoetin (EPOGEN/PROCRIT) injection  10,000 Units Intravenous Q M,W,F-HD  . feeding supplement (NEPRO CARB STEADY)  237 mL Oral BID BM  . furosemide  80 mg Intravenous Once  . gabapentin  100 mg Oral BID  . insulin aspart  0-5 Units Subcutaneous QHS  . insulin aspart  0-9 Units Subcutaneous TID WC  . insulin aspart  4 Units Subcutaneous TID WC  . insulin detemir  15 Units Subcutaneous Q2200  . ipratropium-albuterol  3 mL Nebulization Q6H  . methylPREDNISolone (SOLU-MEDROL) injection  40 mg Intravenous Daily  . metoCLOPramide  5 mg Oral TID AC  . multivitamin  1 tablet Oral QHS  . multivitamin-lutein  1 capsule Oral Daily  . pravastatin  20 mg Oral  q1800  . vitamin C  250 mg Oral BID   sodium chloride, acetaminophen, docusate sodium, fluticasone, lactulose, nystatin cream, Polyvinyl Alcohol-Povidone PF, silver sulfADIAZINE, sodium chloride  Assessment/ Plan:  [name]. is a 72 y.o. black male with end stage renal disease on hemodialysis, CLL, diabetes mellitus type II, COPD, history of bladder cancer who is admitted to [facility] on 12/15/2017 for shortness of breath.   [facility] MWF [facility] AVF 90.5kg  1.  ESRD on HD MWF. Hemodialysis extra treatment yesterday.  Hemodialysis for today. UF goal of 2-3 liters.  - Evaluate daily for dialysis need.   2.  Anemia of chronic kidney disease.  Hemoglobin 8.4 - PRBC transfusion on 9/18.    - EPO with HD treatment. IV venofer as outpatient.  - Appreciate hematology. - Discontinued Auryxia.  - Haptoglobin and LDH are not consistent with hemolysis.   3.  Secondary hyperparathyroidism with hyperphosphatemia: patient states he will take these phos binders the way he wants.  - calcium acetate   4. Hypertension: blood pressure at goal. 152/69 - Discontinued amlodipine.   5. Shortness of breath: requiring high flow oxygen and now on venturi mask - Appreciate pulmonary input - Continue to challenge with ultrafiltration on hemodialysis.    LOS: 6 Mark [name] 9/20/20199:39 AM

## 2017-12-21 NOTE — Progress Notes (Addendum)
Inpatient Diabetes Program Recommendations  AACE/ADA: New Consensus Statement on Inpatient Glycemic Control (2019)  Target Ranges:  Prepandial:   less than 140 mg/dL      Peak postprandial:   less than 180 mg/dL (1-2 hours)      Critically ill patients:  140 - 180 mg/dL   Results for RUARI, MUDGETT (MRN [id number]) as of 12/21/2017 09:26  Ref. Range 12/20/2017 07:23 12/20/2017 11:34 12/20/2017 16:37 12/20/2017 19:22 12/21/2017 07:30  Glucose-Capillary Latest Ref Range: 70 - 99 mg/dL 283 (H) 460 (H) 218 (H) 357 (H) 237 (H)   Review of Glycemic Control  Diabetes history: DM2 Outpatient Diabetes medications: Levemir 5-6 units QHS, Amaryl 4 mg QHS Current orders for Inpatient glycemic control: Levemir 15 units QHS, Novolog 0-9 units TID with meals, Novolog 0-5 units QHS, Novolog 4 units TID with meals for meal coverage; Solumedrol 40 mg daily  Inpatient Diabetes Program Recommendations:  Insulin - Basal: If steroids are continued, please consider increasing Levemir to 18 units QHS. Insulin - Meal Coverage: If steroids are continued, please consider increasing meal coverage to Novolog 7 units TID with meals.  Thanks, Barnie Alderman, RN, MSN, CDE Diabetes Coordinator Inpatient Diabetes Program [phone] (Team Pager from 8am to 5pm)

## 2017-12-21 NOTE — Progress Notes (Addendum)
Daily Progress Note   Patient Name: Mark [name].       Date: 12/21/2017 DOB: 02/05/1946  Age: 72 y.o. MRN#: [id number] Attending Physician: Loletha Grayer, MD Primary Care Physician: Albina Billet, MD Admit Date: 12/15/2017  Reason for Consultation/Follow-up: Establishing goals of care, support offered to patient.   Subjective: Patient is sitting in bed. He states the steroids make him dizzy. He has been having nose bleeds. He is hopeful those can be controlled.   Length of Stay: 6  Current Medications: Scheduled Meds:  . sodium chloride   Intravenous Once  . budesonide (PULMICORT) nebulizer solution  0.5 mg Nebulization BID  . calcium acetate  667 mg Oral With snacks   And  . calcium acetate  667 mg Oral TID WC  . Difluprednate  1 drop Left Eye BID  . epoetin (EPOGEN/PROCRIT) injection  10,000 Units Intravenous Q M,W,F-HD  . feeding supplement (NEPRO CARB STEADY)  237 mL Oral BID BM  . furosemide  80 mg Intravenous Once  . gabapentin  100 mg Oral BID  . insulin aspart  0-5 Units Subcutaneous QHS  . insulin aspart  0-9 Units Subcutaneous TID WC  . insulin aspart  7 Units Subcutaneous TID WC  . insulin detemir  18 Units Subcutaneous Q2200  . ipratropium-albuterol  3 mL Nebulization Q6H  . methylPREDNISolone (SOLU-MEDROL) injection  40 mg Intravenous Daily  . metoCLOPramide  5 mg Oral TID AC  . multivitamin  1 tablet Oral QHS  . multivitamin-lutein  1 capsule Oral Daily  . oxymetazoline  3 spray Each Nare BID  . pravastatin  20 mg Oral q1800  . vitamin C  250 mg Oral BID    Continuous Infusions: . sodium chloride 250 mL (12/19/17 1809)    PRN Meds: sodium chloride, acetaminophen, docusate sodium, fluticasone, lactulose, nystatin cream, Polyvinyl Alcohol-Povidone  PF, silver sulfADIAZINE, sodium chloride  Physical Exam  Constitutional: No distress.  Pulmonary/Chest: Effort normal.  Neurological: He is alert.  Skin: Skin is warm and dry.            Vital Signs: BP 135/76   Pulse 82   Temp 98 F (36.7 C) (Oral)   Resp 18   Ht 5\' 11"  (1.803 m)   Wt 91.2 kg   SpO2 95%   BMI 28.04  kg/m  SpO2: SpO2: 95 % O2 Device: O2 Device: Nasal Cannula O2 Flow Rate: O2 Flow Rate (L/min): 3 L/min  Intake/output summary:   Intake/Output Summary (Last 24 hours) at 12/21/2017 1452 Last data filed at 12/21/2017 1330 Gross per 24 hour  Intake 360 ml  Output 3132 ml  Net -2772 ml   LBM: Last BM Date: 12/20/17 Baseline Weight: Weight: 86.2 kg Most recent weight: Weight: 91.2 kg       Palliative Assessment/Data:      Patient Active Problem List   Diagnosis Date Noted  . Pressure injury of skin 12/16/2017  . Fluid overload 01/15/2017  . UTI (urinary tract infection) 10/20/2016  . Sepsis ([place]) 10/20/2016  . 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 Ulcer of foot, chronic ([place]) 03/21/2015  . History of bladder cancer 11/10/2014  . Penile bleeding 10/12/2014  . Dependence on renal dialysis ([place]) 08/18/2013  . Arterial blood pressure decreased 08/18/2013  . Leukemia ([place]) 08/18/2013  . Retina disorder 08/18/2013  . Breath shortness 08/18/2013  . End-stage renal disease on hemodialysis ([place]) 12/23/2012  . Absolute anemia 07/18/2012  . HPTH (hyperparathyroidism) ([place]) 07/18/2012  . Acidosis, metabolic 55/73/2202  . Hyperparathyroidism ([place]) 07/18/2012  . Abnormal presence of protein in urine 01/01/2012  . HLD (hyperlipidemia) 12/25/2011  . Diabetic retinopathy associated with type 2 diabetes mellitus ([place]) 10/26/1999  . Essential (primary) hypertension 04/04/1999  . Diabetes mellitus type 2, uncontrolled ([place]) 04/04/1999  . Type 2 diabetes mellitus with other diabetic  kidney complication ([place]) 54/27/0623    Palliative Care Assessment & Plan   Recommendations/Plan:  Continue current care.   Code Status:    Code Status Orders  (From admission, onward)         Start     Ordered   12/18/17 1441  Limited resuscitation (code)  Continuous    Question Answer Comment  In the event of cardiac or respiratory ARREST: Initiate Code Blue, Call Rapid Response No   In the event of cardiac or respiratory ARREST: Perform CPR No   In the event of cardiac or respiratory ARREST: Perform Intubation/Mechanical Ventilation No   In the event of cardiac or respiratory ARREST: Use NIPPV/BiPAp only if indicated No   In the event of cardiac or respiratory ARREST: Administer ACLS medications if indicated No   In the event of cardiac or respiratory ARREST: Perform Defibrillation or Cardioversion if indicated No   Comments would want cardioversion      12/18/17 1441        Code Status History    Date Active Date Inactive Code Status Order ID Comments User Context   01/15/2017 0423 01/18/2017 2120 Full Code [id number]  Saundra Shelling, MD Inpatient   10/20/2016 1627 10/22/2016 1817 Full Code [id number]  Lance Coon, MD Inpatient    Advance Directive Documentation     Most Recent Value  Type of Advance Directive  Healthcare Power of Attorney, Living will  Pre-existing out of facility DNR order (yellow form or pink MOST form)  -  "MOST" Form in Place?  -       Prognosis:   Unable to determine  Discharge Planning:  To Be Determined    Thank you for allowing the Palliative Medicine Team to assist in the care of this patient.   Total Time 15 min Prolonged Time Billed  no      Greater than 50%  of this time was spent counseling  and coordinating care related to the above assessment and plan.  Asencion Gowda, NP  Please contact Palliative Medicine Team phone at [phone] for questions and concerns.

## 2017-12-21 NOTE — Progress Notes (Addendum)
Patient is currently followed by outpatient Palliative at home. CMRN [facility] [place] is aware.  Flo Shanks RN, BSN, [place] [place], hospital Liaison [phone]

## 2017-12-21 NOTE — Progress Notes (Signed)
PT Cancellation Note  Patient Details Name: Mark [name]. MRN: [id number] DOB: 08/12/1945   Cancelled Treatment:    Reason Eval/Treat Not Completed: Patient at procedure or test/unavailable.  Pt currently off unit at dialysis.  Will re-attempt PT treatment session at a later date/time.  Leitha Bleak, PT 12/21/17, 10:15 AM [phone]

## 2017-12-21 NOTE — Progress Notes (Signed)
This note also relates to the following rows which could not be included: Pulse Rate - Cannot attach notes to unvalidated device data Resp - Cannot attach notes to unvalidated device data BP - Cannot attach notes to unvalidated device data  Hd started  

## 2017-12-21 NOTE — Plan of Care (Signed)
The patient had dialysis treatment today 1.8L of fluid removed per dialysis nurse. No falls. Bloody drainage from nose still an issue. Afrin started. Insulin coverage Provided. Eye drops given per family member today. Oxygen level was able to be decrease to 3L yet the patient oxygen level  decreased to 85%. 3.5L of oxygen given and the patient's oxygen level has increased to 93%. Tylenol given for headache.  Problem: Health Behavior/Discharge Planning: Goal: Ability to manage health-related needs will improve Outcome: Progressing   Problem: Clinical Measurements: Goal: Ability to maintain clinical measurements within normal limits will improve Outcome: Progressing Goal: Will remain free from infection Outcome: Progressing Goal: Diagnostic test results will improve Outcome: Progressing Goal: Respiratory complications will improve Outcome: Progressing Goal: Cardiovascular complication will be avoided Outcome: Progressing   Problem: Activity: Goal: Risk for activity intolerance will decrease Outcome: Progressing   Problem: Nutrition: Goal: Adequate nutrition will be maintained Outcome: Progressing   Problem: Coping: Goal: Level of anxiety will decrease Outcome: Progressing   Problem: Elimination: Goal: Will not experience complications related to bowel motility Outcome: Progressing Goal: Will not experience complications related to urinary retention Outcome: Progressing

## 2017-12-22 DIAGNOSIS — J9620 Acute and chronic respiratory failure, unspecified whether with hypoxia or hypercapnia: Secondary | ICD-10-CM

## 2017-12-22 LAB — CULTURE, RESPIRATORY W GRAM STAIN: Special Requests: NORMAL

## 2017-12-22 LAB — GLUCOSE, CAPILLARY
GLUCOSE-CAPILLARY: 211 mg/dL — AB (ref 70–99)
GLUCOSE-CAPILLARY: 394 mg/dL — AB (ref 70–99)
GLUCOSE-CAPILLARY: 463 mg/dL — AB (ref 70–99)
GLUCOSE-CAPILLARY: 479 mg/dL — AB (ref 70–99)
Glucose-Capillary: 370 mg/dL — ABNORMAL HIGH (ref 70–99)
Glucose-Capillary: 161 mg/dL — ABNORMAL HIGH (ref 70–99)
Glucose-Capillary: 490 mg/dL — ABNORMAL HIGH (ref 70–99)
Glucose-Capillary: 507 mg/dL (ref 70–99)

## 2017-12-22 LAB — ANGIOTENSIN CONVERTING ENZYME: Angiotensin-Converting Enzyme: 39 U/L (ref 14–82)

## 2017-12-22 LAB — CULTURE, RESPIRATORY: CULTURE: NORMAL

## 2017-12-22 MED ORDER — INSULIN REGULAR HUMAN 100 UNIT/ML IJ SOLN
20.0000 [IU] | Freq: Once | INTRAMUSCULAR | Status: AC
  Administered 2017-12-22: 20 [IU] via INTRAVENOUS
  Filled 2017-12-22: qty 10

## 2017-12-22 MED ORDER — PREDNISONE 20 MG PO TABS: 40.0000 mg | ORAL_TABLET | Freq: Every day | ORAL | Status: DC

## 2017-12-22 MED ORDER — INSULIN ASPART 100 UNIT/ML ~~LOC~~ SOLN
0.0000 [IU] | Freq: Three times a day (TID) | SUBCUTANEOUS | Status: DC
  Administered 2017-12-23: 7 [IU] via SUBCUTANEOUS
  Administered 2017-12-23: 4 [IU] via SUBCUTANEOUS
  Filled 2017-12-22 (×2): qty 1

## 2017-12-22 MED ORDER — PREDNISONE 20 MG PO TABS
40.0000 mg | ORAL_TABLET | Freq: Every day | ORAL | Status: DC
  Administered 2017-12-23: 40 mg via ORAL
  Filled 2017-12-22: qty 2

## 2017-12-22 MED ORDER — INSULIN REGULAR HUMAN 100 UNIT/ML IJ SOLN
15.0000 [IU] | Freq: Once | INTRAMUSCULAR | Status: AC
  Administered 2017-12-22: 15 [IU] via INTRAVENOUS
  Filled 2017-12-22: qty 10

## 2017-12-22 NOTE — Progress Notes (Signed)
Dialysis Treatment ended safely.

## 2017-12-22 NOTE — Progress Notes (Signed)
Spoke to prime doc re CBG. Proceed with coverage with no stat lab verification.

## 2017-12-22 NOTE — Progress Notes (Signed)
PT Cancellation Note  Patient Details Name: Mark [name]. MRN: [id number] DOB: May 28, 1945   Cancelled Treatment:    Reason Eval/Treat Not Completed: Medical issues which prohibited therapy. Per chart review, pt current blood glucose level 394 collected at 11:40; will hold PT at this time and re check this afternoon for blood sugars to be at an acceptable level for activity.    Larae Grooms, PTA 12/22/2017, 12:31 PM

## 2017-12-22 NOTE — Progress Notes (Signed)
Physical Therapy Treatment Patient Details Name: Mark [name]. MRN: [id number] DOB: September 03, 1945 Today's Date: 12/22/2017    History of Present Illness Patient admitted with UTI, cough, AMS, anemia.     PT Comments    Spoke with nursing prior to treatment attempt this afternoon; nursing notes new BS has not been assessed (due at 16:30), but okay to attempt PT. Pt has been waiting to leave for dialysis all afternoon and has not eaten. Pt agreeable to PT (as long as it is not too strenuous due to not eating). Pt notes breathing remains challenging, but is improving. Supplemental O2 needs are decreasing. Pt participates in bed mobility, seated exercises and STS transfer and steps bed to w/c. Requires Min A for STS and Min guard for short ambulation. Pt leaving for hemodialysis post transfer to chair. Continue PT to progress strength, endurance and balance to allow for improved functional mobility.    Follow Up Recommendations  Home health PT     Equipment Recommendations  None recommended by PT    Recommendations for Other Services       Precautions / Restrictions Precautions Precautions: Fall Required Braces or Orthoses: Other Brace/Splint(Has RLE brace from home) Restrictions Weight Bearing Restrictions: No    Mobility  Bed Mobility Overal bed mobility: Modified Independent                Transfers Overall transfer level: Needs assistance Equipment used: Rolling walker (2 wheeled) Transfers: Stand Pivot Transfers   Stand pivot transfers: Min assist       General transfer comment: Rocking method and Min A to stand/steady  Ambulation/Gait Ambulation/Gait assistance: Min guard Gait Distance (Feet): 2 Feet Assistive device: Rolling walker (2 wheeled)       General Gait Details: bed to w/c   Stairs             Wheelchair Mobility    Modified Rankin (Stroke Patients Only)       Balance                                             Cognition Arousal/Alertness: Awake/alert Behavior During Therapy: WFL for tasks assessed/performed Overall Cognitive Status: Within Functional Limits for tasks assessed                                        Exercises General Exercises - Lower Extremity Long Arc Quad: AROM;Both;10 reps;Seated(2 sets) Hip Flexion/Marching: AROM;Strengthening;Both;10 reps;Seated(2 sets) Toe Raises: AROM;Left;10 reps;Seated(2 sets) Heel Raises: AROM;Left;10 reps;Seated(presses toes to ground without heel raise, 2 sets)    General Comments        Pertinent Vitals/Pain Pain Assessment: No/denies pain    Home Living                      Prior Function            PT Goals (current goals can now be found in the care plan section) Progress towards PT goals: Progressing toward goals    Frequency    Min 2X/week      PT Plan Current plan remains appropriate    Co-evaluation              AM-PAC PT "6 Clicks" Daily Activity  Outcome Measure  Difficulty turning  over in bed (including adjusting bedclothes, sheets and blankets)?: A Little Difficulty moving from lying on back to sitting on the side of the bed? : A Little Difficulty sitting down on and standing up from a chair with arms (e.g., wheelchair, bedside commode, etc,.)?: Unable Help needed moving to and from a bed to chair (including a wheelchair)?: A Little Help needed walking in hospital room?: A Little Help needed climbing 3-5 steps with a railing? : A Lot 6 Click Score: 15    End of Session Equipment Utilized During Treatment: Gait belt;Oxygen Activity Tolerance: Patient tolerated treatment well Patient left: Other (comment)(with escort in w/c to go to hemodialysis)   PT Visit Diagnosis: Other abnormalities of gait and mobility (R26.89);Muscle weakness (generalized) (M62.81)     Time: 9021-1155 PT Time Calculation (min) (ACUTE ONLY): 23 min  Charges:  $Gait Training: 8-22  mins $Therapeutic Exercise: 8-22 mins                      Larae Grooms, PTA 12/22/2017, 4:58 PM

## 2017-12-22 NOTE — Progress Notes (Signed)
[facility] NAME: Mark [name]    MR#:  [id number]  DATE OF BIRTH:  1946-02-11  SUBJECTIVE:  CHIEF COMPLAINT:   Chief Complaint  Patient presents with  . Altered Mental Status  Patient seen and evaluated today Decreased shortness of breath Awake and sitting in the chair On oxygen by nasal cannula at 5 L At home patient is on oxygen via nasal cannula at 2 to 3 L  REVIEW OF SYSTEMS:    ROS  CONSTITUTIONAL: No documented fever. No fatigue, weakness. No weight gain, no weight loss.  EYES: No blurry or double vision.  ENT: No tinnitus. No postnasal drip. No redness of the oropharynx.  RESPIRATORY: No cough, no wheeze, no hemoptysis. Decreased dyspnea.  CARDIOVASCULAR: No chest pain. No orthopnea. No palpitations. No syncope.  GASTROINTESTINAL: No nausea, no vomiting or diarrhea. No abdominal pain. No melena or hematochezia.  GENITOURINARY: No dysuria or hematuria.  ENDOCRINE: No polyuria or nocturia. No heat or cold intolerance.  HEMATOLOGY: No anemia. No bruising. No bleeding.  INTEGUMENTARY: No rashes. No lesions.  MUSCULOSKELETAL: No arthritis. No swelling. No gout.  NEUROLOGIC: No numbness, tingling, or ataxia. No seizure-type activity.  PSYCHIATRIC: No anxiety. No insomnia. No ADD.   DRUG ALLERGIES:   Allergies  Allergen Reactions  . Heparin Nausea Only  . Ibuprofen     DUE TO DIALYSIS  . Multivitamin [Centrum]     DUE TO DIALYSIS  . Daypro [Oxaprozin] Swelling and Rash    Other reaction(s): Other (See Comments)  . Tape Rash    VITALS:  Blood pressure 127/62, pulse 66, temperature 98.5 F (36.9 C), temperature source Oral, resp. rate 16, height 5\' 11"  (1.803 m), weight 77.1 kg, SpO2 95 %.  PHYSICAL EXAMINATION:   Physical Exam  GENERAL:  72 y.o.-year-old patient lying in the bed with no acute distress.  EYES: Pupils equal, round, reactive to light and accommodation. No scleral icterus. Extraocular muscles  intact.  HEENT: Head atraumatic, normocephalic. Oropharynx and nasopharynx clear.  NECK:  Supple, no jugular venous distention. No thyroid enlargement, no tenderness.  LUNGS: Improved breath sounds bilaterally, bibasilar crepitations heard. No use of accessory muscles of respiration.  CARDIOVASCULAR: S1, S2 normal. No murmurs, rubs, or gallops.  ABDOMEN: Soft, nontender, nondistended. Bowel sounds present. No organomegaly or mass.  EXTREMITIES: No cyanosis, clubbing or edema b/l.    NEUROLOGIC: Cranial nerves II through XII are intact. No focal Motor or sensory deficits b/l.   PSYCHIATRIC: The patient is alert and oriented x 3.  SKIN: No obvious rash, lesion, or ulcer.   LABORATORY PANEL:   CBC Recent Labs  Lab 12/21/17 1105  WBC 6.2  HGB 8.1*  HCT 23.3*  PLT 123*   ------------------------------------------------------------------------------------------------------------------ Chemistries  Recent Labs  Lab 12/15/17 1748  12/21/17 1105  NA 139   < > 137  K 5.3*   < > 3.9  CL 101   < > 96*  CO2 27   < > 32  GLUCOSE 130*   < > 235*  BUN 39*   < > 31*  CREATININE 6.06*   < > 2.91*  CALCIUM 7.7*   < > 7.8*  AST 25  --   --   ALT 26  --   --   ALKPHOS 72  --   --   BILITOT 1.0  --   --    < > = values in this interval not displayed.   ------------------------------------------------------------------------------------------------------------------  Cardiac Enzymes No results for input(s): TROPONINI in the last 168 hours. ------------------------------------------------------------------------------------------------------------------  RADIOLOGY:  No results found.   ASSESSMENT AND PLAN:  72 year old male patient with history of end-stage renal disease on dialysis, CLL, type 2 diabetes mellitus, COPD on home oxygen, bladder cancer currently under hospitalist service for respiratory distress and hypoxia  -Status post respiratory failure Secondary to fluid overload  probably from blood transfusions Dialysis to remove extra fluid today Appreciate nephrology follow-up Echocardiogram reviewed EF of 55% [place] oxygen to 2 L via nasal cannula Switch to oral steroids  -Sepsis at the time of admission clinically improved Blood cultures no growth Off IV antibiotics Follow-up sputum culture  -End-stage renal disease Continue dialysis  -Epistaxis improved Afrin nasal spray as needed  -Acute on chronic anemia Status post PRBC transfusion Monitor hemoglobin  -History of CVA Aspirin on hold secondary to anemia  -Type 2 diabetes mellitus Sliding scale coverage with insulin  All the records are reviewed and case discussed with Care Management/Social Worker. Management plans discussed with the patient, family and they are in agreement.  CODE STATUS: Partial code  DVT Prophylaxis: SCDs  TOTAL TIME TAKING CARE OF THIS PATIENT: 35 minutes.   POSSIBLE D/C IN 1 to 2 DAYS, DEPENDING ON CLINICAL CONDITION.  Saundra Shelling M.D on 12/22/2017 at 11:59 AM  Between 7am to 6pm - Pager - [phone]  After 6pm go to [email or web address] - password [email or web address] [facility]  Office  [phone]  CC: Primary care physician; Albina Billet, MD  Note: This dictation was prepared with Dragon dictation along with smaller phrase technology. Any transcriptional errors that result from this process are unintentional.

## 2017-12-22 NOTE — Progress Notes (Signed)
[place].   DOB:11/22/1945   OT#:[id number]    Subjective: Patient sitting the chair.  He is trying to do physical exercises sitting the chair.  Appetite is good.  Breathing is improved.  He thinks is fairly back to baseline respiratory status.  Objective:  Vitals:   12/22/17 0447 12/22/17 1100  BP: 127/62   Pulse: 66   Resp: 16   Temp: 98.5 F (36.9 C)   SpO2: 100% 95%     Intake/Output Summary (Last 24 hours) at 12/22/2017 1217 Last data filed at 12/21/2017 1800 Gross per 24 hour  Intake -  Output 1300 ml  Net -1300 ml    GENERAL: Well-nourished well-developed; Alert, no distress and comfortable.   On oxygen.  Sitting the chair.  Alone.  EYES: Positive for pallor no icterus OROPHARYNX: no thrush or ulceration. NECK: supple, no masses felt LYMPH:  no palpable lymphadenopathy in the cervical, axillary or inguinal regions LUNGS: decreased breath sounds to auscultation at bases and  No wheeze or crackles HEART/CVS: regular rate & rhythm and no murmurs; No lower extremity edema ABDOMEN: abdomen soft, non-tender and normal bowel sounds Musculoskeletal:no cyanosis of digits and no clubbing  PSYCH: alert & oriented x 3 with fluent speech NEURO: no focal motor/sensory deficits SKIN:   Chronic ecchymosis noted.   Labs:  Lab Results  Component Value Date   WBC 6.2 12/21/2017   HGB 8.1 (L) 12/21/2017   HCT 23.3 (L) 12/21/2017   MCV 93.1 12/21/2017   PLT 123 (L) 12/21/2017   NEUTROABS 3.5 12/15/2017    Lab Results  Component Value Date   NA 137 12/21/2017   K 3.9 12/21/2017   CL 96 (L) 12/21/2017   CO2 32 12/21/2017    Studies:  No results found.  Assessment & Plan:   # 72 year old male patient with multiple medical problems including CLL/end-stage renal disease on dialysis; congestive cardiac failure; chronic respiratory failure needing oxygen; poorly controlled diabetes which is currently admitted to hospital for generalized weakness feeling poorly.  #  CLL-most recently on ibrutinib; recent PET scan in September 2019 at [facility] showed improvement of the CLL.  September 2019 bone marrow biopsy showed-no evidence of transformation to 70% involvement of the bone marrow with CLL.   Given his acute issues [severe anemia] think it is reasonable to hold his ibrutinib at discharge.  We will plan to restart outpatient.  #New diagnosis of right breast mass-question malignancy; on PET scan.  However patient wants to hold off any further work-up at this time.  #Acute respiratory failure/on chronic-needing oxygen outpatient.  Question fluid overload versus other causes.  Patient had excess fluid taken off as per nephrology.  Respiratory status improved.  Fairly back to baseline.  Discussed with Dr. Juleen China.  #Poor IV access discussed with the patient that he might require a Mediport if he does want to continue frequent blood checks given his multiple medical problems/need for transfusions etc.  He is interested-we will plan outpatient port placement with Dr. Lucky Cowboy; after acute issues resolve.  Cammie Sickle, MD 12/22/2017  12:17 PM

## 2017-12-22 NOTE — Progress Notes (Signed)
[facility]  ROUNDING NOTE   Subjective:   Sitting in chair. States his shortness of breath has improved. 5L [place]  Hemodialysis treatment yesterday. UF of 1.3 liters.   Objective:  Vital signs in last 24 hours:  Temp:  [97.7 F (36.5 C)-98.5 F (36.9 C)] 98.5 F (36.9 C) (09/21 0447) Pulse Rate:  [65-89] 66 (09/21 0447) Resp:  [12-24] 16 (09/21 0447) BP: (91-157)/(45-120) 127/62 (09/21 0447) SpO2:  [85 %-100 %] 95 % (09/21 1100) Weight:  [77.1 kg] 77.1 kg (09/21 0447)  Weight change: -1 kg Filed Weights   12/21/17 0608 12/21/17 0955 12/22/17 0447  Weight: 91.3 kg 91.2 kg 77.1 kg    Intake/Output: I/O last 3 completed shifts: In: 360 [P.O.:360] Out: 1300 [Other:1300]   Intake/Output this shift:  No intake/output data recorded.  Physical Exam: General: No acute distress  Head: Normocephalic, atraumatic. Moist oral mucosal membranes  Eyes: Anicteric  Neck: Supple, trachea midline  Lungs:  Crackles at bases, diminished bilaterally, [place] O2 5L  Heart: regular  Abdomen:  Soft, nontender, bowel sounds present  Extremities: No peripheral edema.  Neurologic: Awake, alert, following commands  Skin: No lesions  Access: LUE AVF    Basic Metabolic Panel: Recent Labs  Lab 12/15/17 1748 12/20/17 1249 12/21/17 1105  NA 139 130* 137  K 5.3* 4.3 3.9  CL 101 89* 96*  CO2 27 28 32  GLUCOSE 130* 452* 235*  BUN 39* 54* 31*  CREATININE 6.06* 5.57* 2.91*  CALCIUM 7.7* 7.4* 7.8*  PHOS  --  2.6 1.7*    Liver Function Tests: Recent Labs  Lab 12/15/17 1748 12/20/17 1249 12/21/17 1105  AST 25  --   --   ALT 26  --   --   ALKPHOS 72  --   --   BILITOT 1.0  --   --   PROT 6.2*  --   --   ALBUMIN 3.6 3.3* 3.3*   No results for input(s): LIPASE, AMYLASE in the last 168 hours. No results for input(s): AMMONIA in the last 168 hours.  CBC: Recent Labs  Lab 12/15/17 1748 12/16/17 1005 12/17/17 1108 12/20/17 1249 12/21/17 1105  WBC 5.2 4.3 3.1* 5.5 6.2   NEUTROABS 3.5  --   --   --   --   HGB 6.6* 8.2* 6.4* 8.4* 8.1*  HCT 20.4* 24.2* 18.4* 24.9* 23.3*  MCV 99.2 96.9 95.4 93.2 93.1  PLT 117* 96* 92* 102* 123*    Cardiac Enzymes: No results for input(s): CKTOTAL, CKMB, CKMBINDEX, TROPONINI in the last 168 hours.  BNP: Invalid input(s): POCBNP  CBG: Recent Labs  Lab 12/21/17 2008 12/21/17 2302 12/22/17 0244 12/22/17 0455 12/22/17 0750  GLUCAP 477* 414* 370* 211* 161*    Microbiology: Results for orders placed or performed during the hospital encounter of 12/15/17  Culture, blood (Routine x 2)     Status: None   Collection Time: 12/15/17  5:48 PM  Result Value Ref Range Status   Specimen Description BLOOD RIGHT ANTECUBITAL  Final   Special Requests   Final    BOTTLES DRAWN AEROBIC AND ANAEROBIC Blood Culture results may not be optimal due to an excessive volume of blood received in culture bottles   Culture   Final    NO GROWTH 5 DAYS Performed at [facility], [address] [place], [place] [address]    Report Status 12/20/2017 FINAL  Final  Urine Culture     Status: Abnormal   Collection Time:  12/15/17  5:48 PM  Result Value Ref Range Status   Specimen Description   Final    URINE, RANDOM Performed at [facility], [address], [place], [place] [address]    Special Requests   Final    NONE Performed at [facility], [address] [place], [place] [address]    Culture MULTIPLE SPECIES PRESENT, SUGGEST RECOLLECTION (A)  Final   Report Status 12/17/2017 FINAL  Final  Expectorated sputum assessment w rflx to resp cult     Status: None   Collection Time: 12/20/17  5:41 AM  Result Value Ref Range Status   Specimen Description SPUTUM  Final   Special Requests Normal  Final   Sputum evaluation   Final    THIS SPECIMEN IS ACCEPTABLE FOR SPUTUM CULTURE Performed at [facility], [address], [place], [place] [address]    Report Status 12/20/2017 FINAL  Final  Culture,  respiratory     Status: None (Preliminary result)   Collection Time: 12/20/17  5:41 AM  Result Value Ref Range Status   Specimen Description   Final    SPUTUM Performed at [facility], [address] [place], [place] [address]    Special Requests   Final    Normal Reflexed from [phone] Performed at [facility], [address] [place], [place] [address]    Gram Stain   Final    FEW WBC PRESENT, PREDOMINANTLY PMN MODERATE GRAM NEGATIVE RODS MODERATE GRAM POSITIVE COCCI    Culture   Final    CULTURE REINCUBATED FOR BETTER GROWTH Performed at [facility], [place] [address], [place], [place] [address]    Report Status PENDING  Incomplete    Coagulation Studies: No results for input(s): LABPROT, INR in the last 72 hours.  Urinalysis: No results for input(s): COLORURINE, LABSPEC, PHURINE, GLUCOSEU, HGBUR, BILIRUBINUR, KETONESUR, PROTEINUR, UROBILINOGEN, NITRITE, LEUKOCYTESUR in the last 72 hours.  Invalid input(s): APPERANCEUR    Imaging: No results found.   Medications:   . sodium chloride 250 mL (12/19/17 1809)   . sodium chloride   Intravenous Once  . amLODipine  10 mg Oral Daily  . budesonide (PULMICORT) nebulizer solution  0.5 mg Nebulization BID  . calcium acetate  667 mg Oral With snacks   And  . calcium acetate  667 mg Oral TID WC  . Difluprednate  1 drop Left Eye BID  . epoetin (EPOGEN/PROCRIT) injection  10,000 Units Intravenous Q M,W,F-HD  . feeding supplement (NEPRO CARB STEADY)  237 mL Oral BID BM  . furosemide  80 mg Intravenous Once  . gabapentin  100 mg Oral BID  . insulin aspart  0-5 Units Subcutaneous QHS  . insulin aspart  0-9 Units Subcutaneous TID WC  . insulin aspart  7 Units Subcutaneous TID WC  . insulin detemir  18 Units Subcutaneous Q2200  . ipratropium-albuterol  3 mL Nebulization Q6H  . methylPREDNISolone (SOLU-MEDROL) injection  40 mg Intravenous Daily  . metoCLOPramide  5 mg Oral TID AC  . multivitamin  1  tablet Oral QHS  . multivitamin-lutein  1 capsule Oral Daily  . oxymetazoline  3 spray Each Nare BID  . pravastatin  20 mg Oral q1800  . vitamin C  250 mg Oral BID   sodium chloride, acetaminophen, docusate sodium, fluticasone, lactulose, nystatin cream, Polyvinyl Alcohol-Povidone PF, silver sulfADIAZINE, sodium chloride  Assessment/ Plan:  [name]. is a 72 y.o. black male with end stage  renal disease on hemodialysis, CLL, diabetes mellitus type II, COPD, history of bladder cancer who is admitted to [facility] on 12/15/2017 for shortness of breath.   [facility] M[facility] L AVF 90.5kg  1.  ESRD on HD MWF. Hemodialysis treatment yesterday.  Hemodialysis extra treatment for today. Sequential only. UF goal of 1-2 liters. Orders prepared.   - Evaluate daily for dialysis need.   2.  Anemia of chronic kidney disease.  Hemoglobin 8.1 - multiple PRBC transfusions on admission - EPO with HD treatment. IV venofer as outpatient.  - Appreciate hematology. - Discontinued Auryxia.  - Haptoglobin and LDH are not consistent with hemolysis.   3.  Secondary hyperparathyroidism with hyperphosphatemia: low phosphorus today at 1.7.  - calcium acetate    4. Hypertension: blood pressure at goal. 127/62 - Discontinued amlodipine.   5. Shortness of breath: requiring high flow oxygen. Baseline 3 L [place] - Appreciate pulmonary input - Continue to challenge with ultrafiltration on hemodialysis.    LOS: 7 Siboney Requejo 9/21/201911:25 AM

## 2017-12-22 NOTE — Progress Notes (Signed)
This note also relates to the following rows which could not be included: Pulse Rate - Cannot attach notes to unvalidated device data Resp - Cannot attach notes to unvalidated device data SpO2 - Cannot attach notes to unvalidated device data  Hd started  

## 2017-12-23 LAB — GLUCOSE, CAPILLARY
GLUCOSE-CAPILLARY: 168 mg/dL — AB (ref 70–99)
GLUCOSE-CAPILLARY: 169 mg/dL — AB (ref 70–99)
GLUCOSE-CAPILLARY: 189 mg/dL — AB (ref 70–99)
GLUCOSE-CAPILLARY: 270 mg/dL — AB (ref 70–99)
Glucose-Capillary: 243 mg/dL — ABNORMAL HIGH (ref 70–99)

## 2017-12-23 LAB — OCCULT BLOOD X 1 CARD TO LAB, STOOL: Fecal Occult Bld: NEGATIVE

## 2017-12-23 MED ORDER — PREDNISONE 10 MG PO TABS: 10.0000 mg | ORAL_TABLET | Freq: Every day | ORAL | 0 refills | Status: DC

## 2017-12-23 NOTE — Care Management (Signed)
Patient to discharge home today.  Notified [facility] and Elvera Bicker with [facility].  Orders present to resume RN and PT.

## 2017-12-23 NOTE — Plan of Care (Signed)
Patient discharged to home.  IV site DC'd, bleeding controlled, DC instructions and scrip given to patient, understanding verbalized.  He will leave hospital in car with his wife

## 2017-12-23 NOTE — Discharge Summary (Addendum)
[facility] [place] NAME: Mark [name]    MR#:  [id number]  DATE OF BIRTH:  02-Jun-1945  DATE OF ADMISSION:  12/15/2017 ADMITTING PHYSICIAN: Loletha Grayer, MD  DATE OF DISCHARGE: 12/23/2017  PRIMARY CARE PHYSICIAN: Albina Billet, MD   ADMISSION DIAGNOSIS:  Cough [R05] Lower urinary tract infectious disease [N39.0] Altered mental status, unspecified altered mental status type [R41.82] Anemia, unspecified type [D64.9] Chronic lymphocytic leukemia DISCHARGE DIAGNOSIS:  Active Problems:   Sepsis ([name])   Pressure injury of skin Acute hypoxic respiratory failure End-stage renal disease on dialysis Epistaxis Acute on chronic anemia Type 2 diabetes mellitus Chronic lymphocytic leukemia Right breast mass on PET scan SECONDARY DIAGNOSIS:   Past Medical History:  Diagnosis Date  . Anemia   . Arthritis   . Bladder cancer ([place])   . Chronic kidney disease   . Diabetes mellitus without complication ([place])   . Dialysis patient [facility])    M,W,F  . Dyspnea    DOE  . GERD (gastroesophageal reflux disease)   . HOH (hard of hearing)   . Hypertension   . IBS (irritable bowel syndrome)   . Lymphoma ([place]) 09/27/2014  . Neuropathy    RIGHT LEG  . Stroke [facility])    TIA     ADMITTING HISTORY Mark [name]  is a 72 y.o. male with a known history of end-stage renal disease.  He presents with a temperature of 103 he was very weak and he could inject dressed and he was altered.  Patient is feeling a little bit better now.  States he had some fever and chills last night.  Having some weight loss.  Feeling fatigued.  Some abdominal pain because he has to go to the bathroom.  Occasional cough which is dry and some shortness of breath.  Hospitalist were contacted for further evaluation   HOSPITAL COURSE:  Patient was admitted initially to medical floor and was started on IV Rocephin and vancomycin antibiotics for sepsis which was detected clinically.   Patient had anemia and he was transfused with PRBC IV.  Patient received a dose of lokelma for hyperkalemia during hospitalization.  Patient was followed by nephrology and oncology during the stay in the hospital.  Patient became short of breath and hypoxic and volume overloaded after PRBC transfusion.  He needed oxygen via nasal cannula at high flow up to 6 L.  He received multiple extra sessions of dialysis during the stay in the hospital.  Excess volume was dialyzed.  Patient shortness of breath and hypoxia improved.  Was back to baseline oxygen by nasal cannula at 2 to 3 L.  Cultures did not reveal any growth and urine culture was contaminated and patient's antibiotics were stopped.  Patient had a right breast mass which was new detected on PET scan.  Outpatient oncology follow-up for further management.  In the hospitalization patient had epistaxis which improved and resolved with Afrin nasal spray.  Chemotherapy currently on hold for CLL which will be resumed by oncology as outpatient for further evaluation.  Patient will be discharged home with home home health services.   CONSULTS OBTAINED:  Treatment Team:  Anthonette Legato, MD Erby Pian, MD  DRUG ALLERGIES:   Allergies  Allergen Reactions  . Heparin Nausea Only  . Ibuprofen     DUE TO DIALYSIS  . Multivitamin [Centrum]     DUE TO DIALYSIS  . Daypro [Oxaprozin] Swelling and Rash    Other reaction(s): Other (See  Comments)  . Tape Rash    DISCHARGE MEDICATIONS:   Allergies as of 12/23/2017      Reactions   Heparin Nausea Only   Ibuprofen    DUE TO DIALYSIS   Multivitamin [centrum]    DUE TO DIALYSIS   Daypro [oxaprozin] Swelling, Rash   Other reaction(s): Other (See Comments)   Tape Rash      Medication List    STOP taking these medications   calcium acetate 667 MG capsule Commonly known as:  PHOSLO   ferric citrate 1 GM 210 MG(Fe) tablet Commonly known as:  AURYXIA     TAKE these medications   amLODipine  10 MG tablet Commonly known as:  NORVASC Take 10 mg by mouth daily.   aspirin 81 MG EC tablet Take 81 mg by mouth daily.   docusate sodium 100 MG capsule Commonly known as:  COLACE Take 100 mg by mouth daily as needed for mild constipation.   DUREZOL 0.05 % Emul Generic drug:  Difluprednate Place 1 drop into the left eye 2 (two) times daily.   ENSURE PLUS Liqd Take 1 Can by mouth daily.   fluticasone 50 MCG/ACT nasal spray Commonly known as:  FLONASE Place 1 spray into both nostrils daily as needed for allergies.   gabapentin 100 MG capsule Commonly known as:  NEURONTIN Take 1 capsule (100 mg total) by mouth 3 (three) times daily. What changed:  when to take this   glimepiride 4 MG tablet Commonly known as:  AMARYL Take 4 mg by mouth at bedtime. Prn glucose levels   LEVEMIR FLEXTOUCH 100 UNIT/ML Pen Generic drug:  Insulin Detemir Inject 5-6 Units into the skin daily at 10 pm.   lovastatin 20 MG tablet Commonly known as:  MEVACOR Take 1 tablet by mouth daily.   metoCLOPramide 5 MG tablet Commonly known as:  REGLAN Take 5 mg by mouth 3 (three) times daily before meals. Patient takes 1 tab with each meal.   multivitamin Tabs tablet Take 1 tablet by mouth at bedtime.   nystatin cream Commonly known as:  MYCOSTATIN Apply 1 application topically 2 (two) times daily as needed (irritation).   predniSONE 10 MG tablet Commonly known as:  DELTASONE Take 1 tablet (10 mg total) by mouth daily. Label  & dispense according to the schedule below.  6 tablets day one, then 5 table day 2, then 4 tablets day 3, then 3 tablets day 4, 2 tablets day 5, then 1 tablet day 6, then stop   REFRESH 1.4-0.6 % Soln Generic drug:  Polyvinyl Alcohol-Povidone PF Apply 1 drop to eye 3 (three) times daily as needed (Dry eyes).   silver sulfADIAZINE 1 % cream Commonly known as:  SILVADENE APPLY 1 APPLICATION TOPICALY DAILY What changed:  See the new instructions.       Today  Patient  seen and evaluated today No fever No shortness of breath No cough Tolerating diet well  VITAL SIGNS:  Blood pressure (!) 149/68, pulse 78, temperature 98 F (36.7 C), temperature source Oral, resp. rate 16, height 5\' 11"  (1.803 m), weight 77.1 kg, SpO2 95 %.  I/O:    Intake/Output Summary (Last 24 hours) at 12/23/2017 1339 Last data filed at 12/23/2017 1022 Gross per 24 hour  Intake 960 ml  Output 2001 ml  Net -1041 ml    PHYSICAL EXAMINATION:  Physical Exam  GENERAL:  72 y.o.-year-old patient lying in the bed with no acute distress.  LUNGS: Normal breath sounds bilaterally, no  wheezing, rales,rhonchi or crepitation. No use of accessory muscles of respiration.  CARDIOVASCULAR: S1, S2 normal. No murmurs, rubs, or gallops.  ABDOMEN: Soft, non-tender, non-distended. Bowel sounds present. No organomegaly or mass.  NEUROLOGIC: Moves all 4 extremities. PSYCHIATRIC: The patient is alert and oriented x 3.  SKIN: No obvious rash, lesion, or ulcer.   DATA REVIEW:   CBC Recent Labs  Lab 12/21/17 1105  WBC 6.2  HGB 8.1*  HCT 23.3*  PLT 123*    Chemistries  Recent Labs  Lab 12/21/17 1105  NA 137  K 3.9  CL 96*  CO2 32  GLUCOSE 235*  BUN 31*  CREATININE 2.91*  CALCIUM 7.8*    Cardiac Enzymes No results for input(s): TROPONINI in the last 168 hours.  Microbiology Results  Results for orders placed or performed during the hospital encounter of 12/15/17  Culture, blood (Routine x 2)     Status: None   Collection Time: 12/15/17  5:48 PM  Result Value Ref Range Status   Specimen Description BLOOD RIGHT ANTECUBITAL  Final   Special Requests   Final    BOTTLES DRAWN AEROBIC AND ANAEROBIC Blood Culture results may not be optimal due to an excessive volume of blood received in culture bottles   Culture   Final    NO GROWTH 5 DAYS Performed at [facility], [address] [place], [place] [address]    Report Status 12/20/2017 FINAL  Final  Urine Culture      Status: Abnormal   Collection Time: 12/15/17  5:48 PM  Result Value Ref Range Status   Specimen Description   Final    URINE, RANDOM Performed at [facility], [address], [place], [place] [address]    Special Requests   Final    NONE Performed at [facility], [address] [place], [place] [address]    Culture MULTIPLE SPECIES PRESENT, SUGGEST RECOLLECTION (A)  Final   Report Status 12/17/2017 FINAL  Final  Expectorated sputum assessment w rflx to resp cult     Status: None   Collection Time: 12/20/17  5:41 AM  Result Value Ref Range Status   Specimen Description SPUTUM  Final   Special Requests Normal  Final   Sputum evaluation   Final    THIS SPECIMEN IS ACCEPTABLE FOR SPUTUM CULTURE Performed at [facility], [address], [place], [place] [address]    Report Status 12/20/2017 FINAL  Final  Culture, respiratory     Status: None   Collection Time: 12/20/17  5:41 AM  Result Value Ref Range Status   Specimen Description   Final    SPUTUM Performed at [facility], [address] [place], [place] [address]    Special Requests   Final    Normal Reflexed from [id number] Performed at [facility], [address] [place], [place] [address]    Gram Stain   Final    FEW WBC PRESENT, PREDOMINANTLY PMN MODERATE GRAM NEGATIVE RODS MODERATE GRAM POSITIVE COCCI    Culture   Final    Consistent with normal respiratory flora. Performed at [facility], [place] [address], [place], [place] [address]    Report Status 12/22/2017 FINAL  Final    RADIOLOGY:  No results found.  Follow up with PCP in 1 week.  Management plans discussed with the patient, family and they are in agreement.  CODE STATUS: Partial code    Code Status Orders  (From admission, onward)  Start     Ordered   12/18/17 1441  Limited resuscitation (code)  Continuous    Question Answer Comment  In the event of cardiac or respiratory  ARREST: Initiate Code Blue, Call Rapid Response No   In the event of cardiac or respiratory ARREST: Perform CPR No   In the event of cardiac or respiratory ARREST: Perform Intubation/Mechanical Ventilation No   In the event of cardiac or respiratory ARREST: Use NIPPV/BiPAp only if indicated No   In the event of cardiac or respiratory ARREST: Administer ACLS medications if indicated No   In the event of cardiac or respiratory ARREST: Perform Defibrillation or Cardioversion if indicated No   Comments would want cardioversion      12/18/17 1441        Code Status History    Date Active Date Inactive Code Status Order ID Comments User Context   01/15/2017 0423 01/18/2017 2120 Full Code [id number]  Saundra Shelling, MD Inpatient   10/20/2016 1627 10/22/2016 1817 Full Code [id number]  Lance Coon, MD Inpatient    Advance Directive Documentation     Most Recent Value  Type of Advance Directive  Healthcare Power of [place], Living will  Pre-existing out of facility DNR order (yellow form or pink MOST form)  -  "MOST" Form in Place?  -      TOTAL TIME TAKING CARE OF THIS PATIENT ON DAY OF DISCHARGE: more than 34 minutes.   Saundra Shelling M.D on 12/23/2017 at 1:39 PM  Between 7am to 6pm - Pager - [phone]  After 6pm go to [email or web address] - password [email or web address] [place] [facility]  Office  [phone]  CC: Primary care physician; Albina Billet, MD  Note: This dictation was prepared with Dragon dictation along with smaller phrase technology. Any transcriptional errors that result from this process are unintentional.

## 2017-12-23 NOTE — Progress Notes (Signed)
[facility]  ROUNDING NOTE   Subjective:   Extra hemodialysis treatment yesterday. Sequential only. UF of 2 liters. Four consecutive days of dialysis. Total UF of 7.1 liters removed.   Objective:  Vital signs in last 24 hours:  Temp:  [97.8 F (36.6 C)-98.6 F (37 C)] 97.8 F (36.6 C) (09/22 0552) Pulse Rate:  [69-94] 69 (09/22 0552) Resp:  [13-22] 18 (09/22 0552) BP: (86-145)/(57-110) 131/57 (09/22 0552) SpO2:  [79 %-95 %] 94 % (09/22 0720) FiO2 (%):  [32 %] 32 % (09/22 0218)  Weight change:  Filed Weights   12/21/17 0608 12/21/17 0955 12/22/17 0447  Weight: 91.3 kg 91.2 kg 77.1 kg    Intake/Output: I/O last 3 completed shifts: In: 720 [P.O.:720] Out: 2001 [Other:2001]   Intake/Output this shift:  No intake/output data recorded.  Physical Exam: General: No acute distress  Head: Normocephalic, atraumatic. Moist oral mucosal membranes  Eyes: Anicteric, left conjunctival erythema  Neck: Supple, trachea midline  Lungs:  Crackles at bases, Lilly O2 3L  Heart: regular  Abdomen:  Soft, nontender, bowel sounds present  Extremities: No peripheral edema.  Neurologic: Awake, alert, following commands  Skin: No lesions  Access: LUE AVF    Basic Metabolic Panel: Recent Labs  Lab 12/20/17 1249 12/21/17 1105  NA 130* 137  K 4.3 3.9  CL 89* 96*  CO2 28 32  GLUCOSE 452* 235*  BUN 54* 31*  CREATININE 5.57* 2.91*  CALCIUM 7.4* 7.8*  PHOS 2.6 1.7*    Liver Function Tests: Recent Labs  Lab 12/20/17 1249 12/21/17 1105  ALBUMIN 3.3* 3.3*   No results for input(s): LIPASE, AMYLASE in the last 168 hours. No results for input(s): AMMONIA in the last 168 hours.  CBC: Recent Labs  Lab 12/16/17 1005 12/17/17 1108 12/20/17 1249 12/21/17 1105  WBC 4.3 3.1* 5.5 6.2  HGB 8.2* 6.4* 8.4* 8.1*  HCT 24.2* 18.4* 24.9* 23.3*  MCV 96.9 95.4 93.2 93.1  PLT 96* 92* 102* 123*    Cardiac Enzymes: No results for input(s): CKTOTAL, CKMB, CKMBINDEX, TROPONINI in  the last 168 hours.  BNP: Invalid input(s): POCBNP  CBG: Recent Labs  Lab 12/22/17 2245 12/23/17 0051 12/23/17 0418 12/23/17 0419 12/23/17 0751  GLUCAP 507* 270* 168* 189* 169*    Microbiology: Results for orders placed or performed during the hospital encounter of 12/15/17  Culture, blood (Routine x 2)     Status: None   Collection Time: 12/15/17  5:48 PM  Result Value Ref Range Status   Specimen Description BLOOD RIGHT ANTECUBITAL  Final   Special Requests   Final    BOTTLES DRAWN AEROBIC AND ANAEROBIC Blood Culture results may not be optimal due to an excessive volume of blood received in culture bottles   Culture   Final    NO GROWTH 5 DAYS Performed at [facility], [address], [place], [place] [address]    Report Status 12/20/2017 FINAL  Final  Urine Culture     Status: Abnormal   Collection Time: 12/15/17  5:48 PM  Result Value Ref Range Status   Specimen Description   Final    URINE, RANDOM Performed at [facility], [address] [place], [place] [address]    Special Requests   Final    NONE Performed at [facility], [address], [place], [place] [address]    Culture MULTIPLE SPECIES PRESENT, SUGGEST RECOLLECTION (A)  Final   Report Status 12/17/2017 FINAL  Final  Expectorated sputum  assessment w rflx to resp cult     Status: None   Collection Time: 12/20/17  5:41 AM  Result Value Ref Range Status   Specimen Description SPUTUM  Final   Special Requests Normal  Final   Sputum evaluation   Final    THIS SPECIMEN IS ACCEPTABLE FOR SPUTUM CULTURE Performed at [facility], [address] [place], [place] [address]    Report Status 12/20/2017 FINAL  Final  Culture, respiratory     Status: None   Collection Time: 12/20/17  5:41 AM  Result Value Ref Range Status   Specimen Description   Final    SPUTUM Performed at [facility], [address], [place], [place] [address]    Special Requests    Final    Normal Reflexed from [phone] Performed at [facility], [address] [place], [place] [address]    Gram Stain   Final    FEW WBC PRESENT, PREDOMINANTLY PMN MODERATE GRAM NEGATIVE RODS MODERATE GRAM POSITIVE COCCI    Culture   Final    Consistent with normal respiratory flora. Performed at [facility], [place] [address], [place], [place] [address]    Report Status 12/22/2017 FINAL  Final    Coagulation Studies: No results for input(s): LABPROT, INR in the last 72 hours.  Urinalysis: No results for input(s): COLORURINE, LABSPEC, PHURINE, GLUCOSEU, HGBUR, BILIRUBINUR, KETONESUR, PROTEINUR, UROBILINOGEN, NITRITE, LEUKOCYTESUR in the last 72 hours.  Invalid input(s): APPERANCEUR    Imaging: No results found.   Medications:   . sodium chloride 250 mL (12/19/17 1809)   . sodium chloride   Intravenous Once  . amLODipine  10 mg Oral Daily  . budesonide (PULMICORT) nebulizer solution  0.5 mg Nebulization BID  . calcium acetate  667 mg Oral With snacks   And  . calcium acetate  667 mg Oral TID WC  . Difluprednate  1 drop Left Eye BID  . epoetin (EPOGEN/PROCRIT) injection  10,000 Units Intravenous Q M,W,F-HD  . feeding supplement (NEPRO CARB STEADY)  237 mL Oral BID BM  . furosemide  80 mg Intravenous Once  . gabapentin  100 mg Oral BID  . insulin aspart  0-20 Units Subcutaneous TID AC & HS  . insulin aspart  7 Units Subcutaneous TID WC  . insulin detemir  18 Units Subcutaneous Q2200  . ipratropium-albuterol  3 mL Nebulization Q6H  . metoCLOPramide  5 mg Oral TID AC  . multivitamin  1 tablet Oral QHS  . multivitamin-lutein  1 capsule Oral Daily  . oxymetazoline  3 spray Each Nare BID  . pravastatin  20 mg Oral q1800  . predniSONE  40 mg Oral Daily  . vitamin C  250 mg Oral BID   sodium chloride, acetaminophen, docusate sodium, fluticasone, lactulose, nystatin cream, Polyvinyl Alcohol-Povidone PF, silver sulfADIAZINE, sodium chloride  Assessment/  Plan:  [name]. is a 72 y.o. black male with end stage renal disease on hemodialysis, CLL, diabetes mellitus type II, COPD, history of bladder cancer who is admitted to [facility] on 12/15/2017 for shortness of breath.   [facility] MWF [facility] L AVF 90.5kg  1.  ESRD on HD MWF. Extra Hemodialysis treatment yesterday. Four consecutive days of hemodialysis. Total UF of 7.1 liters.  Hemodialysis for tomorrow. Treatment time of 257minutes.   2.  Anemia of chronic kidney disease.  Hemoglobin 8.1 - multiple PRBC transfusions on admission - EPO with HD treatment. IV venofer as outpatient.  -  Appreciate hematology. - Discontinued Auryxia.  - Haptoglobin and LDH are not consistent with hemolysis.   3.  Secondary hyperparathyroidism with hyperphosphatemia: low phosphorus at 1.7.  Discontinued auryxia - calcium acetate    4. Hypertension: blood pressure at goal. 131/57   - Discontinued amlodipine.   5. Shortness of breath: requiring high flow oxygen. Baseline 3 L [place] - Appreciate pulmonary input - Continue to challenge with ultrafiltration on hemodialysis.    LOS: 8 Mark [name] 9/22/20199:53 AM

## 2017-12-24 ENCOUNTER — Observation Stay
Admission: EM | Admit: 2017-12-24 | Discharge: 2017-12-27 | Disposition: A | Discharge status: Home / Self Care | Payer: Medicare HMO | Attending: Internal Medicine | Admitting: Internal Medicine

## 2017-12-24 ENCOUNTER — Other Ambulatory Visit: Payer: Self-pay

## 2017-12-24 ENCOUNTER — Encounter: Payer: Self-pay | Admitting: Emergency Medicine

## 2017-12-24 ENCOUNTER — Emergency Department: Payer: Medicare HMO

## 2017-12-24 DIAGNOSIS — K589 Irritable bowel syndrome without diarrhea: Secondary | ICD-10-CM | POA: Diagnosis not present

## 2017-12-24 DIAGNOSIS — E1122 Type 2 diabetes mellitus with diabetic chronic kidney disease: Secondary | ICD-10-CM | POA: Diagnosis not present

## 2017-12-24 DIAGNOSIS — Z9842 Cataract extraction status, left eye: Secondary | ICD-10-CM | POA: Insufficient documentation

## 2017-12-24 DIAGNOSIS — R0902 Hypoxemia: Secondary | ICD-10-CM | POA: Insufficient documentation

## 2017-12-24 DIAGNOSIS — Z79899 Other long term (current) drug therapy: Secondary | ICD-10-CM | POA: Insufficient documentation

## 2017-12-24 DIAGNOSIS — Z7982 Long term (current) use of aspirin: Secondary | ICD-10-CM | POA: Diagnosis not present

## 2017-12-24 DIAGNOSIS — Z9841 Cataract extraction status, right eye: Secondary | ICD-10-CM | POA: Insufficient documentation

## 2017-12-24 DIAGNOSIS — N186 End stage renal disease: Secondary | ICD-10-CM | POA: Diagnosis not present

## 2017-12-24 DIAGNOSIS — K219 Gastro-esophageal reflux disease without esophagitis: Secondary | ICD-10-CM | POA: Insufficient documentation

## 2017-12-24 DIAGNOSIS — J90 Pleural effusion, not elsewhere classified: Secondary | ICD-10-CM | POA: Diagnosis not present

## 2017-12-24 DIAGNOSIS — I2699 Other pulmonary embolism without acute cor pulmonale: Secondary | ICD-10-CM | POA: Insufficient documentation

## 2017-12-24 DIAGNOSIS — E1140 Type 2 diabetes mellitus with diabetic neuropathy, unspecified: Secondary | ICD-10-CM | POA: Diagnosis not present

## 2017-12-24 DIAGNOSIS — H9190 Unspecified hearing loss, unspecified ear: Secondary | ICD-10-CM | POA: Insufficient documentation

## 2017-12-24 DIAGNOSIS — Z8673 Personal history of transient ischemic attack (TIA), and cerebral infarction without residual deficits: Secondary | ICD-10-CM | POA: Insufficient documentation

## 2017-12-24 DIAGNOSIS — Z794 Long term (current) use of insulin: Secondary | ICD-10-CM | POA: Diagnosis not present

## 2017-12-24 DIAGNOSIS — E11621 Type 2 diabetes mellitus with foot ulcer: Secondary | ICD-10-CM | POA: Insufficient documentation

## 2017-12-24 DIAGNOSIS — I120 Hypertensive chronic kidney disease with stage 5 chronic kidney disease or end stage renal disease: Secondary | ICD-10-CM | POA: Diagnosis not present

## 2017-12-24 DIAGNOSIS — Z9889 Other specified postprocedural states: Secondary | ICD-10-CM | POA: Insufficient documentation

## 2017-12-24 DIAGNOSIS — E785 Hyperlipidemia, unspecified: Secondary | ICD-10-CM | POA: Diagnosis not present

## 2017-12-24 DIAGNOSIS — E1165 Type 2 diabetes mellitus with hyperglycemia: Secondary | ICD-10-CM | POA: Insufficient documentation

## 2017-12-24 DIAGNOSIS — Z992 Dependence on renal dialysis: Secondary | ICD-10-CM | POA: Diagnosis not present

## 2017-12-24 DIAGNOSIS — Z87891 Personal history of nicotine dependence: Secondary | ICD-10-CM | POA: Diagnosis not present

## 2017-12-24 DIAGNOSIS — Z886 Allergy status to analgesic agent status: Secondary | ICD-10-CM | POA: Insufficient documentation

## 2017-12-24 DIAGNOSIS — M1990 Unspecified osteoarthritis, unspecified site: Secondary | ICD-10-CM | POA: Diagnosis not present

## 2017-12-24 DIAGNOSIS — Z888 Allergy status to other drugs, medicaments and biological substances status: Secondary | ICD-10-CM | POA: Insufficient documentation

## 2017-12-24 DIAGNOSIS — I82411 Acute embolism and thrombosis of right femoral vein: Secondary | ICD-10-CM | POA: Diagnosis present

## 2017-12-24 DIAGNOSIS — Z809 Family history of malignant neoplasm, unspecified: Secondary | ICD-10-CM | POA: Insufficient documentation

## 2017-12-24 DIAGNOSIS — Z8551 Personal history of malignant neoplasm of bladder: Secondary | ICD-10-CM | POA: Insufficient documentation

## 2017-12-24 DIAGNOSIS — E11319 Type 2 diabetes mellitus with unspecified diabetic retinopathy without macular edema: Secondary | ICD-10-CM | POA: Insufficient documentation

## 2017-12-24 LAB — COMPREHENSIVE METABOLIC PANEL
ALT: 23 U/L (ref 0–44)
ANION GAP: 11 (ref 5–15)
AST: 17 U/L (ref 15–41)
Albumin: 3.8 g/dL (ref 3.5–5.0)
Alkaline Phosphatase: 82 U/L (ref 38–126)
BUN: 51 mg/dL — AB (ref 8–23)
CO2: 29 mmol/L (ref 22–32)
Calcium: 7.8 mg/dL — ABNORMAL LOW (ref 8.9–10.3)
Chloride: 93 mmol/L — ABNORMAL LOW (ref 98–111)
Creatinine, Ser: 4.13 mg/dL — ABNORMAL HIGH (ref 0.61–1.24)
GFR calc Af Amer: 15 mL/min — ABNORMAL LOW (ref >60)
GFR, EST NON AFRICAN AMERICAN: 13 mL/min — AB (ref >60)
Glucose, Bld: 428 mg/dL — ABNORMAL HIGH (ref 70–99)
POTASSIUM: 4.7 mmol/L (ref 3.5–5.1)
Sodium: 133 mmol/L — ABNORMAL LOW (ref 135–145)
Total Bilirubin: 1.1 mg/dL (ref 0.3–1.2)
Total Protein: 6.4 g/dL — ABNORMAL LOW (ref 6.5–8.1)

## 2017-12-24 LAB — CBC WITH DIFFERENTIAL/PLATELET
BASOS ABS: 0 10*3/uL (ref 0–0.1)
Basophils Relative: 1 %
EOS PCT: 0 %
Eosinophils Absolute: 0 10*3/uL (ref 0–0.7)
HCT: 27.4 % — ABNORMAL LOW (ref 40.0–52.0)
Hemoglobin: 9.1 g/dL — ABNORMAL LOW (ref 13.0–18.0)
LYMPHS PCT: 8 %
Lymphs Abs: 0.5 10*3/uL — ABNORMAL LOW (ref 1.0–3.6)
MCH: 31.9 pg (ref 26.0–34.0)
MCHC: 33.1 g/dL (ref 32.0–36.0)
MCV: 96.3 fL (ref 80.0–100.0)
MONO ABS: 0.1 10*3/uL — AB (ref 0.2–1.0)
Monocytes Relative: 1 %
Neutro Abs: 5.5 10*3/uL (ref 1.4–6.5)
Neutrophils Relative %: 90 %
PLATELETS: 159 10*3/uL (ref 150–440)
RBC: 2.84 MIL/uL — ABNORMAL LOW (ref 4.40–5.90)
RDW: 20.1 % — AB (ref 11.5–14.5)
WBC: 6.1 10*3/uL (ref 3.8–10.6)

## 2017-12-24 LAB — TROPONIN I: TROPONIN I: 0.04 ng/mL — AB (ref <0.03)

## 2017-12-24 MED ORDER — GABAPENTIN 100 MG PO CAPS
100.0000 mg | ORAL_CAPSULE | Freq: Once | ORAL | Status: AC
  Administered 2017-12-24: 100 mg via ORAL
  Filled 2017-12-24: qty 1

## 2017-12-24 NOTE — ED Notes (Signed)
Pt went to X-Ray.

## 2017-12-24 NOTE — ED Triage Notes (Signed)
Pt arrived to the ED via EMS from home for complaints of SOB starting tonight. Pt reports that he is a cancer and dialysis Pt and that he received dialysis today. Pt reports that he uses O2 at ( 2LPM) at home. Pt is AOx4 with tachypnea upon arrival to the ED, NP at bedside.

## 2017-12-24 NOTE — ED Provider Notes (Signed)
[facility] Emergency Department Provider Note  ____________________________________________   None    (approximate)  I have reviewed the triage vital signs and the nursing notes.   HISTORY  Chief Complaint Shortness of Breath   HPI Mark J [name]. is a 72 y.o. male who presents to the emergency department for treatment and evaluation of shortness of breath.  Patient was discharged yesterday from the hospital after being treated for urinary tract infection.  Patient has a list of chronic illnesses for which he has been hospitalized frequently over the past several months.  He states that he went to dialysis today and they pulled more fluid off and usual.  He states that this evening, he was resting and his oxygen saturation dropped down to 70s.  He states that they turned his oxygen up to 5 L and he still did not feel any better.  He also states that he had some left leg cramping but took a 400 mg tablet of magnesium and that seemed to have relieved the leg cramping.  Patient denies any chest pain.  Past Medical History:  Diagnosis Date  . Anemia   . Arthritis   . Bladder cancer ([place])   . Chronic kidney disease   . Diabetes mellitus without complication ([place])   . Dialysis patient [facility])    M,W,F  . Dyspnea    DOE  . GERD (gastroesophageal reflux disease)   . HOH (hard of hearing)   . Hypertension   . IBS (irritable bowel syndrome)   . Lymphoma ([place]) 09/27/2014  . Neuropathy    RIGHT LEG  . Stroke [facility])    TIA    Patient Active Problem List   Diagnosis Date Noted  . Pressure injury of skin 12/16/2017  . Fluid overload 01/15/2017  . UTI (urinary tract infection) 10/20/2016  . Sepsis ([place]) 10/20/2016  . 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 Ulcer of foot, chronic ([place]) 03/21/2015  . History of bladder cancer 11/10/2014  . Penile bleeding 10/12/2014  .  Dependence on renal dialysis ([place]) 08/18/2013  . Arterial blood pressure decreased 08/18/2013  . Leukemia ([place]) 08/18/2013  . Retina disorder 08/18/2013  . Breath shortness 08/18/2013  . End-stage renal disease on hemodialysis ([place]) 12/23/2012  . Absolute anemia 07/18/2012  . HPTH (hyperparathyroidism) ([place]) 07/18/2012  . Acidosis, metabolic 94/17/4081  . Hyperparathyroidism ([place]) 07/18/2012  . Abnormal presence of protein in urine 01/01/2012  . HLD (hyperlipidemia) 12/25/2011  . Diabetic retinopathy associated with type 2 diabetes mellitus ([place]) 10/26/1999  . Essential (primary) hypertension 04/04/1999  . Diabetes mellitus type 2, uncontrolled ([place]) 04/04/1999  . Type 2 diabetes mellitus with other diabetic kidney complication ([place]) 44/81/8563    Past Surgical History:  Procedure Laterality Date  . BACK SURGERY  2007  . CATARACT EXTRACTION W/PHACO Left 03/23/2016   Procedure: CATARACT EXTRACTION PHACO AND INTRAOCULAR LENS PLACEMENT (IOC);  Surgeon: Leandrew Koyanagi, MD;  Location: [facility] ORS;  Service: Ophthalmology;  Laterality: Left;  [place] 52.6 AP% 14.7 CDE 7.72 Fluid pack lot # [id number]  . CATARACT EXTRACTION W/PHACO Right 05/30/2016   Procedure: CATARACT EXTRACTION PHACO AND INTRAOCULAR LENS PLACEMENT ([place]);  Surgeon: Leandrew Koyanagi, MD;  Location: [facility] ORS;  Service: Ophthalmology;  Laterality: Right;  [place] 01:08 AP% 13.9 CDE 9.53  note: could not get IV in patient, so procedure done without anesthesia personell present, ok per Dr Wallace Going fluid pack lo t# [id number]  .  CIRCUMCISION    . PERIPHERAL VASCULAR CATHETERIZATION Left 08/19/2015   Procedure: A/V Shuntogram/Fistulagram;  Surgeon: Algernon Huxley, MD;  Location: [place] [facility];  Service: Cardiovascular;  Laterality: Left;  . PERIPHERAL VASCULAR CATHETERIZATION N/A 08/19/2015   Procedure: A/V Shunt Intervention;  Surgeon: Algernon Huxley, MD;  Location: [place] [facility];  Service: Cardiovascular;  Laterality: N/A;      Prior to Admission medications   Medication Sig Start Date End Date Taking? Authorizing Provider  amLODipine (NORVASC) 10 MG tablet Take 10 mg by mouth daily.  06/02/13  Yes [provider]  aspirin 81 MG EC tablet Take 81 mg by mouth daily.    Yes [provider]  Difluprednate (DUREZOL) 0.05 % EMUL Place 1 drop into the left eye 2 (two) times daily.    Yes [provider]  docusate sodium (COLACE) 100 MG capsule Take 100 mg by mouth daily as needed for mild constipation.   Yes [provider]  ENSURE PLUS (ENSURE PLUS) LIQD Take 1 Can by mouth daily.  01/26/14  Yes [provider]  fluticasone (FLONASE) 50 MCG/ACT nasal spray Place 1 spray into both nostrils daily as needed for allergies.  12/14/14  Yes [provider]  gabapentin (NEURONTIN) 100 MG capsule Take 1 capsule (100 mg total) by mouth 3 (three) times daily. 03/02/16  Yes Edrick Kins, DPM  glimepiride (AMARYL) 4 MG tablet Take 4 mg by mouth at bedtime. Prn glucose levels 10/06/14  Yes [provider]  LEVEMIR FLEXTOUCH 100 UNIT/ML Pen Inject 5-6 Units into the skin daily at 10 pm.  08/21/14  Yes [provider]  lovastatin (MEVACOR) 20 MG tablet Take 1 tablet by mouth daily. 12/25/16  Yes [provider]  metoCLOPramide (REGLAN) 5 MG tablet Take 5 mg by mouth 3 (three) times daily before meals. Patient takes 1 tab with each meal.   Yes [provider]  multivitamin (RENA-VIT) TABS tablet Take 1 tablet by mouth at bedtime.    Yes [provider]  nystatin cream (MYCOSTATIN) Apply 1 application topically 2 (two) times daily as needed (irritation).    Yes [provider]  Polyvinyl Alcohol-Povidone PF (REFRESH) 1.4-0.6 % SOLN Apply 1 drop to eye 3 (three) times daily as needed (Dry eyes).   Yes [provider]  predniSONE (DELTASONE) 10 MG tablet Take 1 tablet (10 mg total) by mouth daily. Label  & dispense according to the  schedule below.  6 tablets day one, then 5 table day 2, then 4 tablets day 3, then 3 tablets day 4, 2 tablets day 5, then 1 tablet day 6, then stop 12/23/17  Yes Pyreddy, Pavan, MD  silver sulfADIAZINE (SILVADENE) 1 % cream APPLY 1 APPLICATION TOPICALY DAILY Patient taking differently: Apply 1 application topically daily as needed (sores).  04/12/17  Yes Edrick Kins, DPM    Allergies Heparin; Ibuprofen; Multivitamin [centrum]; Daypro [oxaprozin]; and Tape  Family History  Problem Relation Age of Onset  . Cancer Mother   . Kidney cancer Neg Hx   . Prostate cancer Neg Hx   . Kidney failure Neg Hx   . Bladder Cancer Neg Hx     Social History Social History   Tobacco Use  . Smoking status: Former Smoker    Types: Cigarettes    Last attempt to quit: 05/19/2013    Years since quitting: 4.6  . Smokeless tobacco: Never Used  . Tobacco comment: quit  Substance Use Topics  .  Alcohol use: No    Alcohol/week: 0.0 standard drinks  . Drug use: No    Review of Systems  Constitutional: No fever/chills Eyes: No visual changes. ENT: No sore throat. Cardiovascular: Denies chest pain. Respiratory: Positive for shortness of breath. Gastrointestinal: No abdominal pain.  No nausea, no vomiting.  No diarrhea.  No constipation. Genitourinary: Negative for dysuria. Musculoskeletal: Positive for left leg cramping. Skin: Negative for rash. Neurological: Negative for headaches, focal weakness or numbness. ____________________________________________   PHYSICAL EXAM:  VITAL SIGNS: ED Triage Vitals  Enc Vitals Group     BP 129/40     Pulse 69     Resp 18     Temp 98.5     Temp src      SpO2 97     Weight      Height      Head Circumference      Peak Flow      Pain Score      Pain Loc      Pain Edu?      Excl. in [place]?     Constitutional: Alert and oriented. Well appearing and in no acute distress. Eyes: Conjunctivae are normal. PERRL. EOMI. Head: Atraumatic. Nose: No  congestion/rhinnorhea. Mouth/Throat: Mucous membranes are moist.  Oropharynx non-erythematous. Neck: No stridor.   Cardiovascular: Normal rate, regular rhythm. Grossly normal heart sounds.  Good peripheral circulation. Respiratory: Normal respiratory effort.  No retractions. Lungs CTAB. Gastrointestinal: Soft and nontender. No distention. No abdominal bruits. No CVA tenderness. Musculoskeletal: No lower extremity tenderness nor edema.  No joint effusions. Neurologic:  Normal speech and language. No gross focal neurologic deficits are appreciated. No gait instability. Skin:  Skin is warm, dry and intact. No rash noted. Psychiatric: Mood and affect are normal. Speech and behavior are normal.  ____________________________________________   LABS (all labs ordered are listed, but only abnormal results are displayed)  Labs Reviewed  CBC WITH DIFFERENTIAL/PLATELET - Abnormal; Notable for the following components:      Result Value   RBC 2.84 (*)    Hemoglobin 9.1 (*)    HCT 27.4 (*)    RDW 20.1 (*)    Lymphs Abs 0.5 (*)    Monocytes Absolute 0.1 (*)    All other components within normal limits  COMPREHENSIVE METABOLIC PANEL - Abnormal; Notable for the following components:   Sodium 133 (*)    Chloride 93 (*)    Glucose, Bld 428 (*)    BUN 51 (*)    Creatinine, Ser 4.13 (*)    Calcium 7.8 (*)    Total Protein 6.4 (*)    GFR calc non Af Amer 13 (*)    GFR calc Af Amer 15 (*)    All other components within normal limits  TROPONIN I - Abnormal; Notable for the following components:   Troponin I 0.04 (*)    All other components within normal limits   ____________________________________________  EKG  ED ECG REPORT I, Sherrie George, the attending physician, personally viewed and interpreted this ECG.   Date: 12/24/2017  EKG Time: 9:00 PM  Rate: 94  Rhythm: unchanged from previous tracings  Axis: normal  Intervals:none  ST&T Change:  none  ____________________________________________  RADIOLOGY  ED MD interpretation:  Chest x-ray shows near resolution of left base opacification and small right pleural effusion which is stable.  Official radiology report(s): Dg Chest 2 View  Result Date: 12/24/2017 CLINICAL DATA:  Acute onset shortness of breath. EXAM: CHEST - 2 VIEW  COMPARISON:  12/18/2017 and 12/15/2017 FINDINGS: Lungs are adequately inflated demonstrate a small right-sided pleural effusion likely with associated right basilar atelectasis unchanged. Interval improved left base opacification. Cardiomediastinal silhouette and remainder of the exam is unchanged. IMPRESSION: Stable small right pleural effusion likely with associated right basilar atelectasis. Near resolution of left base opacification. Electronically Signed   By: Marin Olp M.D.   On: 12/24/2017 21:43   US Venous Img Lower Bilateral  Result Date: 12/25/2017 CLINICAL DATA:  Bilateral lower extremity pain/cramping for 1 day with dyspnea. EXAM: BILATERAL LOWER EXTREMITY VENOUS DOPPLER ULTRASOUND TECHNIQUE: Gray-scale sonography with graded compression, as well as color Doppler and duplex ultrasound were performed to evaluate the lower extremity deep venous systems from the level of the common femoral vein and including the common femoral, femoral, profunda femoral, popliteal and calf veins including the posterior tibial, peroneal and gastrocnemius veins when visible. The superficial great saphenous vein was also interrogated. Spectral Doppler was utilized to evaluate flow at rest and with distal augmentation maneuvers in the common femoral, femoral and popliteal veins. COMPARISON:  Left lower extremity venous Doppler scan dated 01/15/2017 FINDINGS: RIGHT LOWER EXTREMITY Common Femoral Vein: No evidence of thrombus. Normal compressibility, respiratory phasicity and response to augmentation. Saphenofemoral Junction: No evidence of thrombus. Normal compressibility and  flow on color Doppler imaging. Profunda Femoral Vein: No evidence of thrombus. Normal compressibility and flow on color Doppler imaging. Femoral Vein: Nonocclusive thrombosis of the midportion of the right femoral vein. Patent compressible proximal and distal portions of the right femoral vein. Popliteal Vein: No evidence of thrombus. Normal compressibility, respiratory phasicity and response to augmentation. Calf Veins: No evidence of thrombus. Normal compressibility and flow on color Doppler imaging. Superficial Great Saphenous Vein: No evidence of thrombus. Normal compressibility. Venous Reflux:  None. Other Findings:  None. LEFT LOWER EXTREMITY Common Femoral Vein: No evidence of thrombus. Normal compressibility, respiratory phasicity and response to augmentation. Saphenofemoral Junction: No evidence of thrombus. Normal compressibility and flow on color Doppler imaging. Profunda Femoral Vein: No evidence of thrombus. Normal compressibility and flow on color Doppler imaging. Femoral Vein: No evidence of thrombus. Normal compressibility, respiratory phasicity and response to augmentation. Popliteal Vein: No evidence of thrombus. Normal compressibility, respiratory phasicity and response to augmentation. Calf Veins: No evidence of thrombus. Normal compressibility and flow on color Doppler imaging. Superficial Great Saphenous Vein: No evidence of thrombus. Normal compressibility. Venous Reflux:  None. Other Findings:  None. IMPRESSION: 1. Nonocclusive deep venous thrombosis of the midportion of the right femoral vein. 2. Otherwise patent deep veins of the lower extremities bilaterally. Electronically Signed   By: Ilona Sorrel M.D.   On: 12/25/2017 03:00    ____________________________________________   PROCEDURES  Procedure(s) performed: None  Procedures  Critical Care performed: No  ____________________________________________   INITIAL IMPRESSION / ASSESSMENT AND PLAN / ED COURSE  As part of my  medical decision making, I reviewed the following data within the electronic MEDICAL RECORD NUMBER Notes from prior ED visits   72 year old male presenting to the emergency department for treatment and evaluation of shortness of breath.  He now feels better unless he tries to roll over onto his side, then feels short of breath. Will order some labs to make sure electrolytes are in an acceptable range as well as troponin and ECG and do a chest x-ray.  Differential includes but is not limited to electrolyte disturbance, COPD exacerbation, MI, PE, or DVT.  [place] of bilateral LE reveals a non-occlusive DVT in the right  femoral vein. Because of his episode of shortness of breath, he will need a VQ scan since he is not a candidate for CTA. He will be admitted. Patient and family aware and agree with the plan.        ____________________________________________   FINAL CLINICAL IMPRESSION(S) / ED DIAGNOSES  Final diagnoses:  Acute deep vein thrombosis (DVT) of femoral vein of right lower extremity [facility])     ED Discharge Orders    None       Note:  This document was prepared using Dragon voice recognition software and may include unintentional dictation errors.    Victorino Dike, FNP 12/26/17 1959    Eula Listen, MD 12/27/17 2153

## 2017-12-25 ENCOUNTER — Other Ambulatory Visit: Payer: Self-pay

## 2017-12-25 ENCOUNTER — Emergency Department: Payer: Medicare HMO

## 2017-12-25 ENCOUNTER — Observation Stay: Payer: Medicare HMO

## 2017-12-25 DIAGNOSIS — R0902 Hypoxemia: Secondary | ICD-10-CM | POA: Diagnosis present

## 2017-12-25 LAB — HEMOGLOBIN A1C
Hgb A1c MFr Bld: 6.1 % — ABNORMAL HIGH (ref 4.8–5.6)
Mean Plasma Glucose: 128.37 mg/dL

## 2017-12-25 LAB — GLUCOSE, CAPILLARY
Glucose-Capillary: 486 mg/dL — ABNORMAL HIGH (ref 70–99)
Glucose-Capillary: 336 mg/dL — ABNORMAL HIGH (ref 70–99)
Glucose-Capillary: 304 mg/dL — ABNORMAL HIGH (ref 70–99)
Glucose-Capillary: 259 mg/dL — ABNORMAL HIGH (ref 70–99)

## 2017-12-25 LAB — TSH: TSH: 2.012 u[IU]/mL (ref 0.350–4.500)

## 2017-12-25 LAB — MRSA PCR SCREENING: MRSA by PCR: NEGATIVE

## 2017-12-25 MED ORDER — INSULIN ASPART 100 UNIT/ML ~~LOC~~ SOLN
0.0000 [IU] | Freq: Every day | SUBCUTANEOUS | Status: AC
  Administered 2017-12-25: 22:00:00 3 [IU] via SUBCUTANEOUS
  Filled 2017-12-25: qty 1

## 2017-12-25 MED ORDER — INSULIN DETEMIR 100 UNIT/ML ~~LOC~~ SOLN
2.0000 [IU] | Freq: Every day | SUBCUTANEOUS | Status: DC
  Filled 2017-12-25: qty 0.02

## 2017-12-25 MED ORDER — SILVER SULFADIAZINE 1 % EX CREA
1.0000 "application " | TOPICAL_CREAM | Freq: Every day | CUTANEOUS | Status: DC | PRN
  Filled 2017-12-25: qty 85

## 2017-12-25 MED ORDER — ONDANSETRON HCL 4 MG/2ML IJ SOLN: 4.0000 mg | Freq: Four times a day (QID) | INTRAMUSCULAR | Status: DC | PRN

## 2017-12-25 MED ORDER — INSULIN GLARGINE 100 UNIT/ML ~~LOC~~ SOLN: 12.0000 [IU] | Freq: Every day | SUBCUTANEOUS | Status: DC

## 2017-12-25 MED ORDER — ONDANSETRON HCL 4 MG PO TABS: 4.0000 mg | ORAL_TABLET | Freq: Four times a day (QID) | ORAL | Status: DC | PRN

## 2017-12-25 MED ORDER — RENA-VITE PO TABS
1.0000 | ORAL_TABLET | Freq: Every day | ORAL | Status: DC
  Administered 2017-12-25 – 2017-12-26 (×2): 1 via ORAL
  Filled 2017-12-25 (×3): qty 1

## 2017-12-25 MED ORDER — INSULIN DETEMIR 100 UNIT/ML ~~LOC~~ SOLN: 5.0000 [IU] | Freq: Every day | SUBCUTANEOUS | Status: DC

## 2017-12-25 MED ORDER — NYSTATIN 100000 UNIT/GM EX CREA
1.0000 "application " | TOPICAL_CREAM | Freq: Two times a day (BID) | CUTANEOUS | Status: DC | PRN
  Filled 2017-12-25: qty 15

## 2017-12-25 MED ORDER — ACETAMINOPHEN 650 MG RE SUPP: 650.0000 mg | Freq: Four times a day (QID) | RECTAL | Status: DC | PRN

## 2017-12-25 MED ORDER — INSULIN GLARGINE 100 UNIT/ML ~~LOC~~ SOLN
10.0000 [IU] | Freq: Every day | SUBCUTANEOUS | Status: DC
  Administered 2017-12-25 – 2017-12-26 (×2): 10 [IU] via SUBCUTANEOUS
  Filled 2017-12-25 (×3): qty 0.1

## 2017-12-25 MED ORDER — PREDNISONE 20 MG PO TABS
40.0000 mg | ORAL_TABLET | Freq: Every day | ORAL | Status: AC
  Administered 2017-12-25: 40 mg via ORAL
  Filled 2017-12-25: qty 2

## 2017-12-25 MED ORDER — POLYVINYL ALCOHOL 1.4 % OP SOLN
1.0000 [drp] | Freq: Three times a day (TID) | OPHTHALMIC | Status: DC | PRN
  Filled 2017-12-25: qty 15

## 2017-12-25 MED ORDER — INSULIN ASPART 100 UNIT/ML ~~LOC~~ SOLN
0.0000 [IU] | Freq: Three times a day (TID) | SUBCUTANEOUS | Status: DC
  Administered 2017-12-25 – 2017-12-26 (×3): 11 [IU] via SUBCUTANEOUS
  Administered 2017-12-27: 08:00:00 3 [IU] via SUBCUTANEOUS
  Administered 2017-12-27: 13:00:00 8 [IU] via SUBCUTANEOUS
  Filled 2017-12-25 (×6): qty 1

## 2017-12-25 MED ORDER — FLUTICASONE PROPIONATE 50 MCG/ACT NA SUSP
1.0000 | Freq: Every day | NASAL | Status: DC | PRN
  Filled 2017-12-25: qty 16

## 2017-12-25 MED ORDER — APIXABAN 5 MG PO TABS
10.0000 mg | ORAL_TABLET | Freq: Two times a day (BID) | ORAL | Status: DC
  Administered 2017-12-25 – 2017-12-27 (×5): 10 mg via ORAL
  Filled 2017-12-25 (×6): qty 2

## 2017-12-25 MED ORDER — INSULIN ASPART 100 UNIT/ML ~~LOC~~ SOLN
14.0000 [IU] | Freq: Once | SUBCUTANEOUS | Status: AC
  Administered 2017-12-25: 14 [IU] via SUBCUTANEOUS
  Filled 2017-12-25: qty 1

## 2017-12-25 MED ORDER — DOCUSATE SODIUM 100 MG PO CAPS
100.0000 mg | ORAL_CAPSULE | Freq: Every day | ORAL | Status: DC | PRN
  Administered 2017-12-25: 10:00:00 100 mg via ORAL

## 2017-12-25 MED ORDER — INSULIN ASPART 100 UNIT/ML ~~LOC~~ SOLN: 0.0000 [IU] | Freq: Three times a day (TID) | SUBCUTANEOUS | Status: DC

## 2017-12-25 MED ORDER — PRAVASTATIN SODIUM 20 MG PO TABS
20.0000 mg | ORAL_TABLET | Freq: Every day | ORAL | Status: DC
  Administered 2017-12-25 – 2017-12-26 (×2): 20 mg via ORAL
  Filled 2017-12-25 (×2): qty 1

## 2017-12-25 MED ORDER — APIXABAN 5 MG PO TABS: 5.0000 mg | ORAL_TABLET | Freq: Two times a day (BID) | ORAL | Status: DC

## 2017-12-25 MED ORDER — TECHNETIUM TO 99M ALBUMIN AGGREGATED
4.0000 | Freq: Once | INTRAVENOUS | Status: AC | PRN
  Administered 2017-12-25: 12:00:00 4.3 via INTRAVENOUS

## 2017-12-25 MED ORDER — ASPIRIN EC 81 MG PO TBEC
81.0000 mg | DELAYED_RELEASE_TABLET | Freq: Every day | ORAL | Status: DC
  Administered 2017-12-25 – 2017-12-27 (×3): 81 mg via ORAL
  Filled 2017-12-25 (×3): qty 1

## 2017-12-25 MED ORDER — INSULIN ASPART 100 UNIT/ML ~~LOC~~ SOLN
3.0000 [IU] | Freq: Three times a day (TID) | SUBCUTANEOUS | Status: DC
  Administered 2017-12-25 – 2017-12-27 (×4): 3 [IU] via SUBCUTANEOUS
  Filled 2017-12-25 (×4): qty 1

## 2017-12-25 MED ORDER — AMLODIPINE BESYLATE 10 MG PO TABS
10.0000 mg | ORAL_TABLET | Freq: Every day | ORAL | Status: DC
  Administered 2017-12-25: 10:00:00 10 mg via ORAL
  Filled 2017-12-25 (×2): qty 1

## 2017-12-25 MED ORDER — DOCUSATE SODIUM 100 MG PO CAPS
100.0000 mg | ORAL_CAPSULE | Freq: Two times a day (BID) | ORAL | Status: DC
  Administered 2017-12-25 – 2017-12-27 (×4): 100 mg via ORAL
  Filled 2017-12-25 (×5): qty 1

## 2017-12-25 MED ORDER — ACETAMINOPHEN 325 MG PO TABS: 650.0000 mg | ORAL_TABLET | Freq: Four times a day (QID) | ORAL | Status: DC | PRN

## 2017-12-25 MED ORDER — METOCLOPRAMIDE HCL 5 MG PO TABS
5.0000 mg | ORAL_TABLET | Freq: Three times a day (TID) | ORAL | Status: DC
  Administered 2017-12-25 – 2017-12-27 (×7): 5 mg via ORAL
  Filled 2017-12-25 (×4): qty 1
  Filled 2017-12-25: qty 0.5
  Filled 2017-12-25 (×2): qty 1
  Filled 2017-12-25: qty 0.5
  Filled 2017-12-25: qty 1

## 2017-12-25 MED ORDER — HEPARIN SODIUM (PORCINE) 5000 UNIT/ML IJ SOLN: 5000.0000 [IU] | Freq: Three times a day (TID) | INTRAMUSCULAR | Status: DC

## 2017-12-25 MED ORDER — TECHNETIUM TC 99M DIETHYLENETRIAME-PENTAACETIC ACID
30.0000 | Freq: Once | INTRAVENOUS | Status: AC | PRN
  Administered 2017-12-25: 34.43 via RESPIRATORY_TRACT

## 2017-12-25 MED ORDER — PREDNISONE 20 MG PO TABS
30.0000 mg | ORAL_TABLET | Freq: Every day | ORAL | Status: AC
  Administered 2017-12-26: 30 mg via ORAL
  Filled 2017-12-25: qty 1

## 2017-12-25 MED ORDER — IPRATROPIUM-ALBUTEROL 0.5-2.5 (3) MG/3ML IN SOLN
3.0000 mL | Freq: Three times a day (TID) | RESPIRATORY_TRACT | Status: DC
  Administered 2017-12-25 – 2017-12-27 (×4): 3 mL via RESPIRATORY_TRACT
  Filled 2017-12-25 (×4): qty 3

## 2017-12-25 MED ORDER — DIFLUPREDNATE 0.05 % OP EMUL
1.0000 [drp] | Freq: Two times a day (BID) | OPHTHALMIC | Status: DC
  Administered 2017-12-25 – 2017-12-27 (×3): 1 [drp] via OPHTHALMIC
  Filled 2017-12-25: qty 5

## 2017-12-25 MED ORDER — POLYVINYL ALCOHOL-POVIDONE PF 1.4-0.6 % OP SOLN: 1.0000 [drp] | Freq: Three times a day (TID) | OPHTHALMIC | Status: DC | PRN

## 2017-12-25 MED ORDER — GABAPENTIN 100 MG PO CAPS
100.0000 mg | ORAL_CAPSULE | Freq: Three times a day (TID) | ORAL | Status: DC
  Administered 2017-12-25 – 2017-12-27 (×5): 100 mg via ORAL
  Filled 2017-12-25 (×6): qty 1

## 2017-12-25 MED ORDER — PREDNISONE 20 MG PO TABS
20.0000 mg | ORAL_TABLET | Freq: Every day | ORAL | Status: AC
  Administered 2017-12-27: 09:00:00 20 mg via ORAL
  Filled 2017-12-25: qty 1

## 2017-12-25 MED ORDER — ALBUTEROL SULFATE (2.5 MG/3ML) 0.083% IN NEBU
2.5000 mg | INHALATION_SOLUTION | Freq: Four times a day (QID) | RESPIRATORY_TRACT | Status: DC | PRN
  Administered 2017-12-25: 15:00:00 2.5 mg via RESPIRATORY_TRACT
  Filled 2017-12-25: qty 3

## 2017-12-25 MED ORDER — PREDNISONE 10 MG PO TABS: 10.0000 mg | ORAL_TABLET | Freq: Every day | ORAL | Status: DC

## 2017-12-25 NOTE — Progress Notes (Signed)
Please note patient is currently followed by out patient Palliative at home. CMRN Joni Reining made aware.  Flo Shanks RN, BSN, [place] and [facility] [place], hospital Liaison [phone]

## 2017-12-25 NOTE — Care Management Note (Addendum)
Case Management Note  Patient Details  Name: Osvaldo Lamping. MRN: [id number] Date of Birth: 10/18/1945  Subjective/Objective:                 Patient discharged from [facility] 9/22 and presented back to ED with hypoxia 9/24.  Placed in observation .  [facility] not able to see him before he presented back.  Is followed by outpatient palliative.  Chronic oxygen with [facility]. Patient states that when he was in [facility], "they were suppose to get me a bedside  and that did not happen."  Requests to speak with financial counselor to see if there are any grants "like [employer] has" to help with hospital bills.   Action/Plan:  Requested order for bsc. [facility],OUtpatient palliative and Elvera Bicker with [facility] aware of admission.  Suggested to [facility] that agency makes a home visit on day of discharge. Provided patient with contact information for financial counselors and emailed them to reach out to patient.   Expected Discharge Date:                  Expected Discharge Plan:     In-House Referral:     Discharge planning Services     Post Acute Care Choice:    Choice offered to:     DME Arranged:    DME Agency:     HH Arranged:    HH Agency:     Status of Service:     If discussed at [employer], dates discussed:    Additional Comments:  Katrina Stack, RN 12/25/2017, 1:41 PM

## 2017-12-25 NOTE — Progress Notes (Signed)
Spoke with pt regarding bringing medication from home. Pt stated he would "not leave his eye drops" with [place]. "He had left his eye drops with [place] and at [facility]" and they had been "lost at both places". Pt stated "family" would "give" him his "eye drops" and "take the eye drops back home".  Discussed with Pt that medication given must be labeld by pharmacy and stay on site. Pt verbalized understanding and stated that he "will not leave medication". Pharmacist  and MD aware.

## 2017-12-25 NOTE — Care Management (Signed)
Provided patient with coupon for Eliquis.  Verbally confirms he has pharmacy coverage. BSC has been delivered to room.  Attending to include in home health order to make home visit day of discharge.  Patient would have to be discharged early enough in the day for this to be possible

## 2017-12-25 NOTE — H&P (Signed)
Mark [name]. is an 72 y.o. male.   Chief Complaint: Shortness of breath HPI: The patient with past medical history of ESRD on dialysis, diabetes, bladder cancer, CLL and history of stroke presents emergency department complaining of shortness of breath.  The patient was recently discharged from this hospital following admission for altered mental status and possible pneumonia.  He was dialyzed on 6 consecutive days during his last hospital admission for fluid overload which is why concern to him today that he felt acutely short of breath.  He checked his oxygen saturations which were as low as 72 at times.  In the emergency department he required 3 L of oxygen via nasal cannula to maintain oxygen saturations greater than 92%.  Ultrasound of his right lower extremity revealed a nonocclusive DVT.  Due to his continued oxygen requirement emergency department staff and call the hospitalist service for further management.  Past Medical History:  Diagnosis Date  . Anemia   . Arthritis   . Bladder cancer ([place])   . Chronic kidney disease   . Diabetes mellitus without complication ([place])   . Dialysis patient [facility])    M,W,F  . Dyspnea    DOE  . GERD (gastroesophageal reflux disease)   . HOH (hard of hearing)   . Hypertension   . IBS (irritable bowel syndrome)   . Lymphoma ([place]) 09/27/2014  . Neuropathy    RIGHT LEG  . Stroke [facility])    TIA    Past Surgical History:  Procedure Laterality Date  . BACK SURGERY  2007  . CATARACT EXTRACTION W/PHACO Left 03/23/2016   Procedure: CATARACT EXTRACTION PHACO AND INTRAOCULAR LENS PLACEMENT (IOC);  Surgeon: Leandrew Koyanagi, MD;  Location: [facility] ORS;  Service: Ophthalmology;  Laterality: Left;  [place] 52.6 AP% 14.7 CDE 7.72 Fluid pack lot # [id number]  . CATARACT EXTRACTION W/PHACO Right 05/30/2016   Procedure: CATARACT EXTRACTION PHACO AND INTRAOCULAR LENS PLACEMENT ([place]);  Surgeon: Leandrew Koyanagi, MD;  Location: [facility] ORS;  Service: Ophthalmology;   Laterality: Right;  [place] 01:08 AP% 13.9 CDE 9.53  note: could not get IV in patient, so procedure done without anesthesia personell present, ok per Dr Mark [name] fluid pack lo t# [id number]  . CIRCUMCISION    . PERIPHERAL VASCULAR CATHETERIZATION Left 08/19/2015   Procedure: A/V Shuntogram/Fistulagram;  Surgeon: Algernon Huxley, MD;  Location: [place] [facility];  Service: Cardiovascular;  Laterality: Left;  . PERIPHERAL VASCULAR CATHETERIZATION N/A 08/19/2015   Procedure: A/V Shunt Intervention;  Surgeon: Algernon Huxley, MD;  Location: [place] [facility];  Service: Cardiovascular;  Laterality: N/A;    Family History  Problem Relation Age of Onset  . Cancer Mother   . Kidney cancer Neg Hx   . Prostate cancer Neg Hx   . Kidney failure Neg Hx   . Bladder Cancer Neg Hx    Social History:  reports that he quit smoking about 4 years ago. His smoking use included cigarettes. He has never used smokeless tobacco. He reports that he does not drink alcohol or use drugs.  Allergies:  Allergies  Allergen Reactions  . Heparin Nausea Only  . Ibuprofen Other (See Comments)    DUE TO DIALYSIS  . Multivitamin [Centrum] Other (See Comments)    DUE TO DIALYSIS  . Daypro [Oxaprozin] Swelling and Rash    Other reaction(s): Other (See Comments)  . Tape Rash    Medications Prior to Admission  Medication Sig Dispense Refill  . amLODipine (NORVASC) 10 MG tablet Take  10 mg by mouth daily.     [name] aspirin 81 MG EC tablet Take 81 mg by mouth daily.     . Difluprednate (DUREZOL) 0.05 % EMUL Place 1 drop into the left eye 2 (two) times daily.     [name] docusate sodium (COLACE) 100 MG capsule Take 100 mg by mouth daily as needed for mild constipation.    . ENSURE PLUS (ENSURE PLUS) LIQD Take 1 Can by mouth daily.     . fluticasone (FLONASE) 50 MCG/ACT nasal spray Place 1 spray into both nostrils daily as needed for allergies.     [name] gabapentin (NEURONTIN) 100 MG capsule Take 1 capsule (100 mg total) by mouth 3 (three)  times daily. 90 capsule 0  . glimepiride (AMARYL) 4 MG tablet Take 4 mg by mouth at bedtime. Prn glucose levels    . LEVEMIR FLEXTOUCH 100 UNIT/ML Pen Inject 5-6 Units into the skin daily at 10 pm.     . lovastatin (MEVACOR) 20 MG tablet Take 1 tablet by mouth daily.    . metoCLOPramide (REGLAN) 5 MG tablet Take 5 mg by mouth 3 (three) times daily before meals. Patient takes 1 tab with each meal.    . multivitamin (RENA-VIT) TABS tablet Take 1 tablet by mouth at bedtime.     [name] nystatin cream (MYCOSTATIN) Apply 1 application topically 2 (two) times daily as needed (irritation).     . Polyvinyl Alcohol-Povidone PF (REFRESH) 1.4-0.6 % SOLN Apply 1 drop to eye 3 (three) times daily as needed (Dry eyes).    . predniSONE (DELTASONE) 10 MG tablet Take 1 tablet (10 mg total) by mouth daily. Label  & dispense according to the schedule below.  6 tablets day one, then 5 table day 2, then 4 tablets day 3, then 3 tablets day 4, 2 tablets day 5, then 1 tablet day 6, then stop 21 tablet 0  . silver sulfADIAZINE (SILVADENE) 1 % cream APPLY 1 APPLICATION TOPICALY DAILY (Patient taking differently: Apply 1 application topically daily as needed (sores). ) 50 g 1    Results for orders placed or performed during the hospital encounter of 12/24/17 (from the past 48 hour(s))  CBC with Differential     Status: Abnormal   Collection Time: 12/24/17  9:22 PM  Result Value Ref Range   WBC 6.1 3.8 - 10.6 K/uL   RBC 2.84 (L) 4.40 - 5.90 MIL/uL   Hemoglobin 9.1 (L) 13.0 - 18.0 g/dL   HCT 27.4 (L) 40.0 - 52.0 %   MCV 96.3 80.0 - 100.0 fL   MCH 31.9 26.0 - 34.0 pg   MCHC 33.1 32.0 - 36.0 g/dL   RDW 20.1 (H) 11.5 - 14.5 %   Platelets 159 150 - 440 K/uL   Neutrophils Relative % 90 %   Neutro Abs 5.5 1.4 - 6.5 K/uL   Lymphocytes Relative 8 %   Lymphs Abs 0.5 (L) 1.0 - 3.6 K/uL   Monocytes Relative 1 %   Monocytes Absolute 0.1 (L) 0.2 - 1.0 K/uL   Eosinophils Relative 0 %   Eosinophils Absolute 0.0 0 - 0.7 K/uL    Basophils Relative 1 %   Basophils Absolute 0.0 0 - 0.1 K/uL    Comment: Performed at [facility], [address] [place], [place] [address]  Comprehensive metabolic panel     Status: Abnormal   Collection Time: 12/24/17  9:22 PM  Result Value Ref Range   Sodium 133 (L) 135 - 145 mmol/L  Potassium 4.7 3.5 - 5.1 mmol/L   Chloride 93 (L) 98 - 111 mmol/L   CO2 29 22 - 32 mmol/L   Glucose, Bld 428 (H) 70 - 99 mg/dL   BUN 51 (H) 8 - 23 mg/dL   Creatinine, Ser 4.13 (H) 0.61 - 1.24 mg/dL   Calcium 7.8 (L) 8.9 - 10.3 mg/dL   Total Protein 6.4 (L) 6.5 - 8.1 g/dL   Albumin 3.8 3.5 - 5.0 g/dL   AST 17 15 - 41 U/L   ALT 23 0 - 44 U/L   Alkaline Phosphatase 82 38 - 126 U/L   Total Bilirubin 1.1 0.3 - 1.2 mg/dL   GFR calc non Af Amer 13 (L) >60 mL/min   GFR calc Af Amer 15 (L) >60 mL/min    Comment: (NOTE) The eGFR has been calculated using the CKD EPI equation. This calculation has not been validated in all clinical situations. eGFR's persistently <60 mL/min signify possible Chronic Kidney Disease.    Anion gap 11 5 - 15    Comment: Performed at [facility], [address] [place], [place] [address]  Troponin I     Status: Abnormal   Collection Time: 12/24/17  9:22 PM  Result Value Ref Range   Troponin I 0.04 (HH) <0.03 ng/mL    Comment: CRITICAL RESULT CALLED TO, READ BACK BY AND VERIFIED WITH HENRY RIVERA 12/24/17 2240 JML Performed at [facility], [address] [place], [place] [address]   TSH     Status: None   Collection Time: 12/24/17  9:22 PM  Result Value Ref Range   TSH 2.012 0.350 - 4.500 uIU/mL    Comment: Performed by a 3rd Generation assay with a functional sensitivity of <=0.01 uIU/mL. Performed at [facility], [address], [place],  [address]    Dg Chest 2 View  Result Date: 12/24/2017 CLINICAL DATA:  Acute onset shortness of breath. EXAM: CHEST - 2 VIEW COMPARISON:  12/18/2017 and 12/15/2017 FINDINGS: Lungs  are adequately inflated demonstrate a small right-sided pleural effusion likely with associated right basilar atelectasis unchanged. Interval improved left base opacification. Cardiomediastinal silhouette and remainder of the exam is unchanged. IMPRESSION: Stable small right pleural effusion likely with associated right basilar atelectasis. Near resolution of left base opacification. Electronically Signed   By: Marin Olp M.D.   On: 12/24/2017 21:43   US Venous Img Lower Bilateral  Result Date: 12/25/2017 CLINICAL DATA:  Bilateral lower extremity pain/cramping for 1 day with dyspnea. EXAM: BILATERAL LOWER EXTREMITY VENOUS DOPPLER ULTRASOUND TECHNIQUE: Gray-scale sonography with graded compression, as well as color Doppler and duplex ultrasound were performed to evaluate the lower extremity deep venous systems from the level of the common femoral vein and including the common femoral, femoral, profunda femoral, popliteal and calf veins including the posterior tibial, peroneal and gastrocnemius veins when visible. The superficial great saphenous vein was also interrogated. Spectral Doppler was utilized to evaluate flow at rest and with distal augmentation maneuvers in the common femoral, femoral and popliteal veins. COMPARISON:  Left lower extremity venous Doppler scan dated 01/15/2017 FINDINGS: RIGHT LOWER EXTREMITY Common Femoral Vein: No evidence of thrombus. Normal compressibility, respiratory phasicity and response to augmentation. Saphenofemoral Junction: No evidence of thrombus. Normal compressibility and flow on color Doppler imaging. Profunda Femoral Vein: No evidence of thrombus. Normal compressibility and flow on color Doppler imaging. Femoral Vein: Nonocclusive thrombosis of the midportion of the right femoral vein. Patent compressible proximal and distal portions of the right  femoral vein. Popliteal Vein: No evidence of thrombus. Normal compressibility, respiratory phasicity and response to  augmentation. Calf Veins: No evidence of thrombus. Normal compressibility and flow on color Doppler imaging. Superficial Great Saphenous Vein: No evidence of thrombus. Normal compressibility. Venous Reflux:  None. Other Findings:  None. LEFT LOWER EXTREMITY Common Femoral Vein: No evidence of thrombus. Normal compressibility, respiratory phasicity and response to augmentation. Saphenofemoral Junction: No evidence of thrombus. Normal compressibility and flow on color Doppler imaging. Profunda Femoral Vein: No evidence of thrombus. Normal compressibility and flow on color Doppler imaging. Femoral Vein: No evidence of thrombus. Normal compressibility, respiratory phasicity and response to augmentation. Popliteal Vein: No evidence of thrombus. Normal compressibility, respiratory phasicity and response to augmentation. Calf Veins: No evidence of thrombus. Normal compressibility and flow on color Doppler imaging. Superficial Great Saphenous Vein: No evidence of thrombus. Normal compressibility. Venous Reflux:  None. Other Findings:  None. IMPRESSION: 1. Nonocclusive deep venous thrombosis of the midportion of the right femoral vein. 2. Otherwise patent deep veins of the lower extremities bilaterally. Electronically Signed   By: Ilona Sorrel M.D.   On: 12/25/2017 03:00    Review of Systems  Constitutional: Negative for chills and fever.  HENT: Negative for sore throat and tinnitus.   Eyes: Negative for blurred vision and redness.  Respiratory: Positive for shortness of breath. Negative for cough.   Cardiovascular: Negative for chest pain, palpitations, orthopnea and PND.  Gastrointestinal: Negative for abdominal pain, diarrhea, nausea and vomiting.  Genitourinary: Negative for dysuria, frequency and urgency.  Musculoskeletal: Negative for joint pain and myalgias.  Skin: Negative for rash.       No lesions  Neurological: Negative for speech change, focal weakness and weakness.  Endo/Heme/Allergies: Does not  bruise/bleed easily.       No temperature intolerance  Psychiatric/Behavioral: Negative for depression and suicidal ideas.    Blood pressure (!) 105/53, pulse 78, temperature 99.4 F (37.4 C), temperature source Oral, resp. rate 18, height _0  (1.803 m), weight 87.8 kg, SpO2 93 %. Physical Exam  Vitals reviewed. Constitutional: He is oriented to person, place, and time. He appears well-developed and well-nourished. No distress.  HENT:  Head: Normocephalic and atraumatic.  Mouth/Throat: Oropharynx is clear and moist.  Eyes: Pupils are equal, round, and reactive to light. Conjunctivae and EOM are normal. No scleral icterus.  Neck: Normal range of motion. Neck supple. No JVD present. No tracheal deviation present. No thyromegaly present.  Cardiovascular: Normal rate, regular rhythm and normal heart sounds. Exam reveals no gallop and no friction rub.  No murmur heard. Respiratory: Effort normal and breath sounds normal. No respiratory distress.  GI: Soft. Bowel sounds are normal. He exhibits no distension. There is no tenderness.  Genitourinary:  Genitourinary Comments: Deferred  Musculoskeletal: Normal range of motion. He exhibits no edema.  Lymphadenopathy:    He has no cervical adenopathy.  Neurological: He is alert and oriented to person, place, and time. No cranial nerve deficit.  Skin: Skin is warm and dry. No rash noted. No erythema.  Psychiatric: He has a normal mood and affect. His behavior is normal. Judgment and thought content normal.     Assessment/Plan This is a 72 year old male admitted for hypoxia. 1.  Hypoxia: Transient pulmonary edema versus hyperviscosity secondary to CLL versus PE.  Rule out the latter.  Continue supplemental oxygen as needed.  2.   DVT: I have started the patient on Eliquis as treatment for his DVT and possible PE. 3.  ESRD: Consult  nephrology for continuation of dialysis 4.  Diabetes mellitus type 2: Continue basal insulin therapy as well as  sliding scale insulin while hospitalized.  5.  Hypertension: Controlled; continue amlodipine 6.  DVT prophylaxis: As above 7.  GI prophylaxis: None The patient is a partial code.  Time spent on admission orders and patient care approximately 45 minutes  Harrie Foreman, MD 12/25/2017, 7:29 AM

## 2017-12-25 NOTE — Progress Notes (Signed)
ANTICOAGULATION CONSULT NOTE - Initial Consult  Pharmacy Consult for Eliquis Indication: DVT and r/o PE  Allergies  Allergen Reactions  . Heparin Nausea Only  . Ibuprofen Other (See Comments)    DUE TO DIALYSIS  . Multivitamin [Centrum] Other (See Comments)    DUE TO DIALYSIS  . Daypro [Oxaprozin] Swelling and Rash    Other reaction(s): Other (See Comments)  . Tape Rash    Patient Measurements: Height: 5\' 11"  (180.3 cm) Weight: 193 lb 9 oz (87.8 kg) IBW/kg (Calculated) : 75.3 Heparin Dosing Weight:   Vital Signs: Temp: 99.4 F (37.4 C) (09/24 0537) Temp Source: Oral (09/24 0537) BP: 105/53 (09/24 0537) Pulse Rate: 78 (09/24 0537)  Labs: Recent Labs    12/24/17 2122  HGB 9.1*  HCT 27.4*  PLT 159  CREATININE 4.13*  TROPONINI 0.04*    Estimated Creatinine Clearance: 17.2 mL/min (A) (by C-G formula based on SCr of 4.13 mg/dL (H)).   Medical History: Past Medical History:  Diagnosis Date  . Anemia   . Arthritis   . Bladder cancer ([place])   . Chronic kidney disease   . Diabetes mellitus without complication ([place])   . Dialysis patient [facility])    M,W,F  . Dyspnea    DOE  . GERD (gastroesophageal reflux disease)   . HOH (hard of hearing)   . Hypertension   . IBS (irritable bowel syndrome)   . Lymphoma ([place]) 09/27/2014  . Neuropathy    RIGHT LEG  . Stroke [facility])    TIA    Medications:  Infusions:    Assessment: 72 yom cc SOB with PMH ESRD HD, DM, bladder cancer, CLL and history o stroke on ASA. Recently discharged from [facility] 12/23/17. Pharmacy consulted to dose Eliquis for DVT on U/S. No PTA OAC noted. Spoke with hospitalist since ESRD was not included in initial Eliquis studies. Some limited data available about using 5 mg BID in ESRD HD patients. She reached out to nephrologist who says we can use 10 mg po BID x 7 days followed by 5 mg po BID for acute DVT and possible PE.   Goal of Therapy:  Monitor platelets by anticoagulation protocol: Yes   Plan:   Eliquis 10 mg po BID x 7 days followed by Eliquis 5 mg po BID  Laural Benes, Pharm.D., BCPS Clinical Pharmacist 12/25/2017,8:45 AM

## 2017-12-25 NOTE — Progress Notes (Signed)
[facility]  ROUNDING NOTE   Subjective:   Patient returned to hospital for hypoxia. Found to have Leg DVT and started on eliquis  Objective:  Vital signs in last 24 hours:  Temp:  [98 F (36.7 C)-99.4 F (37.4 C)] 99.4 F (37.4 C) (09/24 0537) Pulse Rate:  [78-91] 78 (09/24 0537) Resp:  [18-33] 18 (09/24 0537) BP: (105-160)/(39-76) 105/53 (09/24 0537) SpO2:  [91 %-96 %] 93 % (09/24 0537) Weight:  [77 kg-87.8 kg] 87.8 kg (09/24 0537)  Weight change:  Filed Weights   12/24/17 2111 12/25/17 0537  Weight: 77 kg 87.8 kg    Intake/Output: No intake/output data recorded.   Intake/Output this shift:  No intake/output data recorded.  Physical Exam: General: No acute distress  Head: Normocephalic, atraumatic. Moist oral mucosal membranes  Eyes: Anicteric, left conjunctival erythema  Neck: Supple, trachea midline  Lungs:  Decreased breath sounds at bases, [place] O2 3L  Heart: regular  Abdomen:  Soft, nontender, bowel sounds present  Extremities: No peripheral edema.  Neurologic: Awake, alert, following commands  Skin: No lesions  Access: Left forearm AVF    Basic Metabolic Panel: Recent Labs  Lab 12/20/17 1249 12/21/17 1105 12/24/17 2122  NA 130* 137 133*  K 4.3 3.9 4.7  CL 89* 96* 93*  CO2 28 32 29  GLUCOSE 452* 235* 428*  BUN 54* 31* 51*  CREATININE 5.57* 2.91* 4.13*  CALCIUM 7.4* 7.8* 7.8*  PHOS 2.6 1.7*  --     Liver Function Tests: Recent Labs  Lab 12/20/17 1249 12/21/17 1105 12/24/17 2122  AST  --   --  17  ALT  --   --  23  ALKPHOS  --   --  82  BILITOT  --   --  1.1  PROT  --   --  6.4*  ALBUMIN 3.3* 3.3* 3.8   No results for input(s): LIPASE, AMYLASE in the last 168 hours. No results for input(s): AMMONIA in the last 168 hours.  CBC: Recent Labs  Lab 12/20/17 1249 12/21/17 1105 12/24/17 2122  WBC 5.5 6.2 6.1  NEUTROABS  --   --  5.5  HGB 8.4* 8.1* 9.1*  HCT 24.9* 23.3* 27.4*  MCV 93.2 93.1 96.3  PLT 102* 123* 159     Cardiac Enzymes: Recent Labs  Lab 12/24/17 2122  TROPONINI 0.04*    BNP: Invalid input(s): POCBNP  CBG: Recent Labs  Lab 12/23/17 0418 12/23/17 0419 12/23/17 0751 12/23/17 1153 12/25/17 0821  GLUCAP 168* 189* 169* 243* 39*    Microbiology: Results for orders placed or performed during the hospital encounter of 12/15/17  Culture, blood (Routine x 2)     Status: None   Collection Time: 12/15/17  5:48 PM  Result Value Ref Range Status   Specimen Description BLOOD RIGHT ANTECUBITAL  Final   Special Requests   Final    BOTTLES DRAWN AEROBIC AND ANAEROBIC Blood Culture results may not be optimal due to an excessive volume of blood received in culture bottles   Culture   Final    NO GROWTH 5 DAYS Performed at [facility], [address], [place], [place] [address]    Report Status 12/20/2017 FINAL  Final  Urine Culture     Status: Abnormal   Collection Time: 12/15/17  5:48 PM  Result Value Ref Range Status   Specimen Description   Final    URINE, RANDOM Performed at [facility], [address] [place],  [address]  Special Requests   Final    NONE Performed at [facility], [address] [place], [place] [address]    Culture MULTIPLE SPECIES PRESENT, SUGGEST RECOLLECTION (A)  Final   Report Status 12/17/2017 FINAL  Final  Expectorated sputum assessment w rflx to resp cult     Status: None   Collection Time: 12/20/17  5:41 AM  Result Value Ref Range Status   Specimen Description SPUTUM  Final   Special Requests Normal  Final   Sputum evaluation   Final    THIS SPECIMEN IS ACCEPTABLE FOR SPUTUM CULTURE Performed at [facility], [address], [place], [place] [address]    Report Status 12/20/2017 FINAL  Final  Culture, respiratory     Status: None   Collection Time: 12/20/17  5:41 AM  Result Value Ref Range Status   Specimen Description   Final    SPUTUM Performed at [facility], [address], [place], [place] [address]    Special Requests   Final    Normal Reflexed from [id number] Performed at [facility], [address] [place], [place] [address]    Gram Stain   Final    FEW WBC PRESENT, PREDOMINANTLY PMN MODERATE GRAM NEGATIVE RODS MODERATE GRAM POSITIVE COCCI    Culture   Final    Consistent with normal respiratory flora. Performed at [facility], [place] [address], [place], [place] [address]    Report Status 12/22/2017 FINAL  Final    Coagulation Studies: No results for input(s): LABPROT, INR in the last 72 hours.  Urinalysis: No results for input(s): COLORURINE, LABSPEC, PHURINE, GLUCOSEU, HGBUR, BILIRUBINUR, KETONESUR, PROTEINUR, UROBILINOGEN, NITRITE, LEUKOCYTESUR in the last 72 hours.  Invalid input(s): APPERANCEUR    Imaging: Dg Chest 2 View  Result Date: 12/24/2017 CLINICAL DATA:  Acute onset shortness of breath. EXAM: CHEST - 2 VIEW COMPARISON:  12/18/2017 and 12/15/2017 FINDINGS: Lungs are adequately inflated demonstrate a small right-sided pleural effusion likely with associated right basilar atelectasis unchanged. Interval improved left base opacification. Cardiomediastinal silhouette and remainder of the exam is unchanged. IMPRESSION: Stable small right pleural effusion likely with associated right basilar atelectasis. Near resolution of left base opacification. Electronically Signed   By: Marin Olp M.D.   On: 12/24/2017 21:43   US Venous Img Lower Bilateral  Result Date: 12/25/2017 CLINICAL DATA:  Bilateral lower extremity pain/cramping for 1 day with dyspnea. EXAM: BILATERAL LOWER EXTREMITY VENOUS DOPPLER ULTRASOUND TECHNIQUE: Gray-scale sonography with graded compression, as well as color Doppler and duplex ultrasound were performed to evaluate the lower extremity deep venous systems from the level of the common femoral vein and including the common femoral, femoral, profunda femoral, popliteal and calf veins including the posterior  tibial, peroneal and gastrocnemius veins when visible. The superficial great saphenous vein was also interrogated. Spectral Doppler was utilized to evaluate flow at rest and with distal augmentation maneuvers in the common femoral, femoral and popliteal veins. COMPARISON:  Left lower extremity venous Doppler scan dated 01/15/2017 FINDINGS: RIGHT LOWER EXTREMITY Common Femoral Vein: No evidence of thrombus. Normal compressibility, respiratory phasicity and response to augmentation. Saphenofemoral Junction: No evidence of thrombus. Normal compressibility and flow on color Doppler imaging. Profunda Femoral Vein: No evidence of thrombus. Normal compressibility and flow on color Doppler imaging. Femoral Vein: Nonocclusive thrombosis of the midportion of the right femoral vein. Patent compressible proximal and distal portions of the right femoral vein. Popliteal Vein: No evidence of thrombus. Normal compressibility, respiratory phasicity and  response to augmentation. Calf Veins: No evidence of thrombus. Normal compressibility and flow on color Doppler imaging. Superficial Great Saphenous Vein: No evidence of thrombus. Normal compressibility. Venous Reflux:  None. Other Findings:  None. LEFT LOWER EXTREMITY Common Femoral Vein: No evidence of thrombus. Normal compressibility, respiratory phasicity and response to augmentation. Saphenofemoral Junction: No evidence of thrombus. Normal compressibility and flow on color Doppler imaging. Profunda Femoral Vein: No evidence of thrombus. Normal compressibility and flow on color Doppler imaging. Femoral Vein: No evidence of thrombus. Normal compressibility, respiratory phasicity and response to augmentation. Popliteal Vein: No evidence of thrombus. Normal compressibility, respiratory phasicity and response to augmentation. Calf Veins: No evidence of thrombus. Normal compressibility and flow on color Doppler imaging. Superficial Great Saphenous Vein: No evidence of thrombus. Normal  compressibility. Venous Reflux:  None. Other Findings:  None. IMPRESSION: 1. Nonocclusive deep venous thrombosis of the midportion of the right femoral vein. 2. Otherwise patent deep veins of the lower extremities bilaterally. Electronically Signed   By: Ilona Sorrel M.D.   On: 12/25/2017 03:00     Medications:    . amLODipine  10 mg Oral Daily  . apixaban  10 mg Oral BID   Followed by  . [START ON 01/01/2018] apixaban  5 mg Oral BID  . aspirin EC  81 mg Oral Daily  . Difluprednate  1 drop Left Eye BID  . docusate sodium  100 mg Oral BID  . gabapentin  100 mg Oral TID  . insulin aspart  0-5 Units Subcutaneous QHS  . insulin aspart  0-9 Units Subcutaneous TID WC  . insulin glargine  10 Units Subcutaneous Daily  . metoCLOPramide  5 mg Oral TID AC  . multivitamin  1 tablet Oral QHS  . pravastatin  20 mg Oral q1800  . [START ON 12/26/2017] predniSONE  30 mg Oral Q breakfast   Followed by  . [START ON 12/27/2017] predniSONE  20 mg Oral Q breakfast   Followed by  . [START ON 12/28/2017] predniSONE  10 mg Oral Q breakfast   acetaminophen **OR** acetaminophen, docusate sodium, fluticasone, nystatin cream, ondansetron **OR** ondansetron (ZOFRAN) IV, polyvinyl alcohol, silver sulfADIAZINE  Assessment/ Plan:  [name]. is a 72 y.o. black male with end stage renal disease on hemodialysis, CLL, diabetes mellitus type II, COPD, history of bladder cancer who is admitted to [facility] on 12/24/2017 for shortness of breath.   [facility] MWF [facility] L AVF 90.5kg  1.  ESRD on HD MWF. HD on Wednesday    2.  Anemia of chronic kidney disease.  Hemoglobin 9.1 - multiple PRBC transfusions previous admission - EPO with HD treatment. IV venofer as outpatient.   3.  Secondary hyperparathyroidism   Discontinued auryxia - calcium acetate  At home  4. Rt femoral DVT - Eliquis - manufacturer insert states no dose adjustment for ESRD - currently 10 BID x 1 week then, 5 BID  5. Shortness of  breath: requiring oxygen. Baseline 3 L [place] - VQ scan planned for today   LOS: 0 Mark [name] 9/24/20199:50 AM

## 2017-12-25 NOTE — Care Management Obs Status (Signed)
[facility] NOTIFICATION   Patient Details  Name: Mark [name]. MRN: [id number] Date of Birth: 03-07-1946   Medicare Observation Status Notification Given:  Yes    Katrina Stack, RN 12/25/2017, 12:02 PM

## 2017-12-25 NOTE — Progress Notes (Signed)
Patient with end-stage renal disease on hemodialysis, diabetes, bladder cancer, CLL and history of stroke was just discharged from the hospital 2 days ago comes again with hypoxia. -Found to have nonocclusive DVT and started on Eliquis. -Nuclear medicine scan showing low probability for pulmonary embolism -Hypoxia could be related to recent pneumonia.  Not on ABX. Encourage incentive spirometry. - recent CXR with right basilar atelectasis -Recent admission patient had epistaxis-have to be extremely careful now that he is being started on Eliquis.  If any bleeding episodes, will consult vascular for filter placement -Patient already has history of bladder cancer and CLL and most recent PET scan showing right breast mass.  Oncology evaluation as outpatient. -Palliative care consulted

## 2017-12-25 NOTE — Progress Notes (Signed)
Inpatient Diabetes Program Recommendations  AACE/ADA: New Consensus Statement on Inpatient Glycemic Control (2019)  Target Ranges:  Prepandial:   less than 140 mg/dL      Peak postprandial:   less than 180 mg/dL (1-2 hours)      Critically ill patients:  140 - 180 mg/dL   Results for CLAIBORNE, STROBLE (MRN [id number]) as of 12/25/2017 10:24  Ref. Range 12/25/2017 08:21  Glucose-Capillary Latest Ref Range: 70 - 99 mg/dL 486 (H)   Review of Glycemic Control  Diabetes history: DM2 Outpatient Diabetes medications: Levemir 5-6 units QHS, Amaryl 4 mg QHS (PRN based on glucose) Current orders for Inpatient glycemic control: Lantus 10 units daily, Novolog 0-9 units TID with meals, Novolog 0-5 units QHS; Prednisone taper  Inpatient Diabetes Program Recommendations:  Insulin - Basal: Noted Lantus 10 units daily ordered today. If glucose continues to be consistently greater than 200 mg/dl, please consider increasing Lantus to 15 units daily. Insulin - Meal Coverage: Please consider ordering Novolog 3 units TID with meals for meal coverage if patient eats at least 50% of meals.  Thanks, Barnie Alderman, RN, MSN, CDE Diabetes Coordinator Inpatient Diabetes Program [phone] (Team Pager from 8am to 5pm)

## 2017-12-26 ENCOUNTER — Ambulatory Visit: Payer: Medicare HMO | Admitting: Internal Medicine

## 2017-12-26 DIAGNOSIS — R0902 Hypoxemia: Secondary | ICD-10-CM

## 2017-12-26 DIAGNOSIS — Z515 Encounter for palliative care: Secondary | ICD-10-CM | POA: Diagnosis not present

## 2017-12-26 LAB — GLUCOSE, CAPILLARY
Glucose-Capillary: 119 mg/dL — ABNORMAL HIGH (ref 70–99)
Glucose-Capillary: 124 mg/dL — ABNORMAL HIGH (ref 70–99)
Glucose-Capillary: 333 mg/dL — ABNORMAL HIGH (ref 70–99)
Glucose-Capillary: 271 mg/dL — ABNORMAL HIGH (ref 70–99)
Glucose-Capillary: 269 mg/dL — ABNORMAL HIGH (ref 70–99)

## 2017-12-26 MED ORDER — SODIUM CHLORIDE 0.9 % IV SOLN: 100.0000 mL | INTRAVENOUS | Status: DC | PRN

## 2017-12-26 MED ORDER — LIDOCAINE-PRILOCAINE 2.5-2.5 % EX CREA
1.0000 "application " | TOPICAL_CREAM | CUTANEOUS | Status: DC | PRN
  Filled 2017-12-26: qty 5

## 2017-12-26 MED ORDER — EPOETIN ALFA 10000 UNIT/ML IJ SOLN: 4000.0000 [IU] | INTRAMUSCULAR | Status: DC

## 2017-12-26 MED ORDER — CHLORHEXIDINE GLUCONATE CLOTH 2 % EX PADS
6.0000 | MEDICATED_PAD | Freq: Every day | CUTANEOUS | Status: DC
  Administered 2017-12-27: 06:00:00 6 via TOPICAL

## 2017-12-26 MED ORDER — PENTAFLUOROPROP-TETRAFLUOROETH EX AERO
1.0000 "application " | INHALATION_SPRAY | CUTANEOUS | Status: DC | PRN
  Filled 2017-12-26: qty 30

## 2017-12-26 MED ORDER — LIDOCAINE HCL (PF) 1 % IJ SOLN
5.0000 mL | INTRAMUSCULAR | Status: DC | PRN
  Filled 2017-12-26: qty 5

## 2017-12-26 MED ORDER — TORSEMIDE 20 MG PO TABS
100.0000 mg | ORAL_TABLET | Freq: Every day | ORAL | Status: DC
  Administered 2017-12-26 – 2017-12-27 (×2): 100 mg via ORAL
  Filled 2017-12-26 (×2): qty 5
  Filled 2017-12-26: qty 1

## 2017-12-26 NOTE — Progress Notes (Signed)
HD Treatment Initiated    12/26/17 0938  Vital Signs  Pulse Rate 82  Pulse Rate Source Monitor  Resp 13  Oxygen Therapy  SpO2 96 %  O2 Device Nasal Cannula  O2 Flow Rate (L/min) 3.5 L/min  During Hemodialysis Assessment  Blood Flow Rate (mL/min) 400 mL/min  Arterial Pressure (mmHg) -120 mmHg  Venous Pressure (mmHg) 140 mmHg  Transmembrane Pressure (mmHg) 70 mmHg  Ultrafiltration Rate (mL/min) 430 mL/min  Dialysate Flow Rate (mL/min) 800 ml/min  Conductivity: Machine  14  HD Safety Checks Performed Yes  Dialysis Fluid Bolus Normal Saline  Bolus Amount (mL) 250 mL  Intra-Hemodialysis Comments Tx initiated;Progressing as prescribed  Fistula / Graft Left Forearm Arteriovenous fistula  No Placement Date or Time found.   Placed prior to admission: No  Orientation: Left  Access Location: Forearm  Access Type: Arteriovenous fistula  Status Accessed  Needle Size 15

## 2017-12-26 NOTE — Progress Notes (Signed)
Pre HD Assessment    12/26/17 0933  Neurological  Level of Consciousness Alert  Orientation Level Oriented X4  Respiratory  Respiratory Pattern Regular  Chest Assessment Chest expansion symmetrical  Bilateral Breath Sounds Clear;Diminished  Cardiac  Pulse Regular  Heart Sounds Murmur  Jugular Venous Distention (JVD) No  ECG Monitor Yes  Cardiac Rhythm NSR  Vascular  R Radial Pulse +2  L Radial Pulse +2  R Dorsalis Pedis Pulse +2  L Dorsalis Pedis Pulse +2  Generalized Edema None  Integumentary  Integumentary (WDL) X  Skin Color Appropriate for ethnicity  Skin Condition Dry  Skin Integrity Abrasion;Ecchymosis  Ecchymosis Location Abdomen;Flank  Ecchymosis Location Orientation Left;Lateral  Musculoskeletal  Musculoskeletal (WDL) X  Generalized Weakness Yes  GU Assessment  Genitourinary (WDL) X (HD pt)  Psychosocial  Psychosocial (WDL) WDL

## 2017-12-26 NOTE — Progress Notes (Signed)
PT Cancellation Note  Patient Details Name: Mark [name]. MRN: [id number] DOB: 1945/05/12   Cancelled Treatment:    Reason Eval/Treat Not Completed: Patient at procedure or test/unavailable. Order received and chart reviewed. Attempted to see patient however he is currently out of room for dialysis. Will attempt at later date/time as pt is available.  Lyndel Safe Nohely Whitehorn PT, DPT, GCS  Zarif Rathje 12/26/2017, 11:49 AM

## 2017-12-26 NOTE — Progress Notes (Signed)
HD Treatment Complete    12/26/17 1312  Vital Signs  Pulse Rate 80  Pulse Rate Source Monitor  Resp 19  BP 99/68  BP Location Right Leg  BP Method Automatic  Patient Position (if appropriate) Lying  Oxygen Therapy  SpO2 100 %  O2 Device Nasal Cannula  During Hemodialysis Assessment  Blood Flow Rate (mL/min) 400 mL/min  Arterial Pressure (mmHg) -120 mmHg  Venous Pressure (mmHg) 150 mmHg  Transmembrane Pressure (mmHg) 60 mmHg  Ultrafiltration Rate (mL/min) 430 mL/min  Dialysate Flow Rate (mL/min) 800 ml/min  Conductivity: Machine  13.9  HD Safety Checks Performed Yes  Intra-Hemodialysis Comments Progressing as prescribed;See progress note;Tx completed  Fistula / Graft Left Forearm Arteriovenous fistula  No Placement Date or Time found.   Placed prior to admission: No  Orientation: Left  Access Location: Forearm  Access Type: Arteriovenous fistula  Status Deaccessed

## 2017-12-26 NOTE — Progress Notes (Signed)
[facility]  ROUNDING NOTE   Subjective:   Patient returned to hospital for hypoxia. Found to have Leg DVT and started on eliquis Currently seen during HD. Tolerating well   HEMODIALYSIS FLOWSHEET:  Blood Flow Rate (mL/min): 400 mL/min Arterial Pressure (mmHg): -120 mmHg Venous Pressure (mmHg): 150 mmHg Transmembrane Pressure (mmHg): 60 mmHg Ultrafiltration Rate (mL/min): 430 mL/min Dialysate Flow Rate (mL/min): 800 ml/min Conductivity: Machine : 13.9 Conductivity: Machine : 13.9 Dialysis Fluid Bolus: Normal Saline Bolus Amount (mL): 250 mL  States he is not able to walk much. Requiring PT   Objective:  Vital signs in last 24 hours:  Temp:  [97.6 F (36.4 C)-97.7 F (36.5 C)] 97.7 F (36.5 C) (09/25 0930) Pulse Rate:  [68-83] 80 (09/25 1045) Resp:  [12-20] 18 (09/25 1045) BP: (98-148)/(53-85) 139/55 (09/25 1045) SpO2:  [92 %-100 %] 100 % (09/25 1045) Weight:  [89.7 kg-90 kg] 89.7 kg (09/25 0930)  Weight change: 13 kg Filed Weights   12/25/17 0537 12/26/17 0403 12/26/17 0930  Weight: 87.8 kg 90 kg 89.7 kg    Intake/Output: I/O last 3 completed shifts: In: 360 [P.O.:360] Out: -    Intake/Output this shift:  No intake/output data recorded.  Physical Exam: General: No acute distress  Head: Normocephalic, atraumatic. Moist oral mucosal membranes  Eyes: Anicteric, left conjunctival erythema  Neck: Supple, trachea midline  Lungs:  Decreased breath sounds at bases,  O2 3L  Heart: regular  Abdomen:  Soft, nontender,   Extremities: No peripheral edema.  Neurologic: Awake, alert, following commands  Skin: No lesions  Access: Left forearm AVF    Basic Metabolic Panel: Recent Labs  Lab 12/20/17 1249 12/21/17 1105 12/24/17 2122  NA 130* 137 133*  K 4.3 3.9 4.7  CL 89* 96* 93*  CO2 28 32 29  GLUCOSE 452* 235* 428*  BUN 54* 31* 51*  CREATININE 5.57* 2.91* 4.13*  CALCIUM 7.4* 7.8* 7.8*  PHOS 2.6 1.7*  --     Liver Function Tests: Recent  Labs  Lab 12/20/17 1249 12/21/17 1105 12/24/17 2122  AST  --   --  17  ALT  --   --  23  ALKPHOS  --   --  82  BILITOT  --   --  1.1  PROT  --   --  6.4*  ALBUMIN 3.3* 3.3* 3.8   No results for input(s): LIPASE, AMYLASE in the last 168 hours. No results for input(s): AMMONIA in the last 168 hours.  CBC: Recent Labs  Lab 12/20/17 1249 12/21/17 1105 12/24/17 2122  WBC 5.5 6.2 6.1  NEUTROABS  --   --  5.5  HGB 8.4* 8.1* 9.1*  HCT 24.9* 23.3* 27.4*  MCV 93.2 93.1 96.3  PLT 102* 123* 159    Cardiac Enzymes: Recent Labs  Lab 12/24/17 2122  TROPONINI 0.04*    BNP: Invalid input(s): POCBNP  CBG: Recent Labs  Lab 12/25/17 0821 12/25/17 1203 12/25/17 1653 12/25/17 2120 12/26/17 0742  GLUCAP 486* 336* 304* 259* 119*    Microbiology: Results for orders placed or performed during the hospital encounter of 12/24/17  MRSA PCR Screening     Status: None   Collection Time: 12/25/17  9:58 AM  Result Value Ref Range Status   MRSA by PCR NEGATIVE NEGATIVE Final    Comment:        The GeneXpert MRSA Assay (FDA approved for NASAL specimens only), is one component of a comprehensive MRSA colonization surveillance program. It is not intended  to diagnose MRSA infection nor to guide or monitor treatment for MRSA infections. Performed at [facility], [address] [place], [place] [address]     Coagulation Studies: No results for input(s): LABPROT, INR in the last 72 hours.  Urinalysis: No results for input(s): COLORURINE, LABSPEC, PHURINE, GLUCOSEU, HGBUR, BILIRUBINUR, KETONESUR, PROTEINUR, UROBILINOGEN, NITRITE, LEUKOCYTESUR in the last 72 hours.  Invalid input(s): APPERANCEUR    Imaging: Dg Chest 2 View  Result Date: 12/24/2017 CLINICAL DATA:  Acute onset shortness of breath. EXAM: CHEST - 2 VIEW COMPARISON:  12/18/2017 and 12/15/2017 FINDINGS: Lungs are adequately inflated demonstrate a small right-sided pleural effusion likely with associated  right basilar atelectasis unchanged. Interval improved left base opacification. Cardiomediastinal silhouette and remainder of the exam is unchanged. IMPRESSION: Stable small right pleural effusion likely with associated right basilar atelectasis. Near resolution of left base opacification. Electronically Signed   By: Marin Olp M.D.   On: 12/24/2017 21:43   Nm Pulmonary Perf And Vent  Result Date: 12/25/2017 CLINICAL DATA:  Chest pain. EXAM: NUCLEAR MEDICINE VENTILATION - PERFUSION LUNG SCAN TECHNIQUE: Ventilation images were obtained in multiple projections using inhaled aerosol Tc-45m DTPA. Perfusion images were obtained in multiple projections after intravenous injection of Tc-58m-MAA. RADIOPHARMACEUTICALS:  34.4 mCi of Tc-40m DTPA aerosol inhalation and 4.3 mCi Tc110m-MAA IV COMPARISON:  Chest x-ray 12/24/2017. FINDINGS: No ventilation perfusion mismatches noted. Mild ventilatory defects and perfusion defects are noted within the tori defects being larger. These are primarily in the bases and most likely related to previously identified basilar atelectasis and pleural effusions. IMPRESSION: Low probability pulmonary embolus. Electronically Signed   By: Marcello Moores  Register   On: 12/25/2017 11:58   US Venous Img Lower Bilateral  Result Date: 12/25/2017 CLINICAL DATA:  Bilateral lower extremity pain/cramping for 1 day with dyspnea. EXAM: BILATERAL LOWER EXTREMITY VENOUS DOPPLER ULTRASOUND TECHNIQUE: Gray-scale sonography with graded compression, as well as color Doppler and duplex ultrasound were performed to evaluate the lower extremity deep venous systems from the level of the common femoral vein and including the common femoral, femoral, profunda femoral, popliteal and calf veins including the posterior tibial, peroneal and gastrocnemius veins when visible. The superficial great saphenous vein was also interrogated. Spectral Doppler was utilized to evaluate flow at rest and with distal augmentation  maneuvers in the common femoral, femoral and popliteal veins. COMPARISON:  Left lower extremity venous Doppler scan dated 01/15/2017 FINDINGS: RIGHT LOWER EXTREMITY Common Femoral Vein: No evidence of thrombus. Normal compressibility, respiratory phasicity and response to augmentation. Saphenofemoral Junction: No evidence of thrombus. Normal compressibility and flow on color Doppler imaging. Profunda Femoral Vein: No evidence of thrombus. Normal compressibility and flow on color Doppler imaging. Femoral Vein: Nonocclusive thrombosis of the midportion of the right femoral vein. Patent compressible proximal and distal portions of the right femoral vein. Popliteal Vein: No evidence of thrombus. Normal compressibility, respiratory phasicity and response to augmentation. Calf Veins: No evidence of thrombus. Normal compressibility and flow on color Doppler imaging. Superficial Great Saphenous Vein: No evidence of thrombus. Normal compressibility. Venous Reflux:  None. Other Findings:  None. LEFT LOWER EXTREMITY Common Femoral Vein: No evidence of thrombus. Normal compressibility, respiratory phasicity and response to augmentation. Saphenofemoral Junction: No evidence of thrombus. Normal compressibility and flow on color Doppler imaging. Profunda Femoral Vein: No evidence of thrombus. Normal compressibility and flow on color Doppler imaging. Femoral Vein: No evidence of thrombus. Normal compressibility, respiratory phasicity and response to augmentation. Popliteal Vein: No evidence of thrombus. Normal compressibility, respiratory phasicity  and response to augmentation. Calf Veins: No evidence of thrombus. Normal compressibility and flow on color Doppler imaging. Superficial Great Saphenous Vein: No evidence of thrombus. Normal compressibility. Venous Reflux:  None. Other Findings:  None. IMPRESSION: 1. Nonocclusive deep venous thrombosis of the midportion of the right femoral vein. 2. Otherwise patent deep veins of the  lower extremities bilaterally. Electronically Signed   By: Ilona Sorrel M.D.   On: 12/25/2017 03:00     Medications:   . sodium chloride    . sodium chloride     . amLODipine  10 mg Oral Daily  . apixaban  10 mg Oral BID   Followed by  . [START ON 01/01/2018] apixaban  5 mg Oral BID  . aspirin EC  81 mg Oral Daily  . Chlorhexidine Gluconate Cloth  6 each Topical Q0600  . Difluprednate  1 drop Left Eye BID  . docusate sodium  100 mg Oral BID  . gabapentin  100 mg Oral TID  . insulin aspart  0-15 Units Subcutaneous TID WC  . insulin aspart  3 Units Subcutaneous TID WC  . insulin glargine  10 Units Subcutaneous Daily  . ipratropium-albuterol  3 mL Nebulization TID  . metoCLOPramide  5 mg Oral TID AC  . multivitamin  1 tablet Oral QHS  . pravastatin  20 mg Oral q1800  . predniSONE  30 mg Oral Q breakfast   Followed by  . [START ON 12/27/2017] predniSONE  20 mg Oral Q breakfast   Followed by  . [START ON 12/28/2017] predniSONE  10 mg Oral Q breakfast   sodium chloride, sodium chloride, acetaminophen **OR** acetaminophen, albuterol, docusate sodium, fluticasone, lidocaine (PF), lidocaine-prilocaine, nystatin cream, ondansetron **OR** ondansetron (ZOFRAN) IV, pentafluoroprop-tetrafluoroeth, polyvinyl alcohol, silver sulfADIAZINE  Assessment/ Plan:  [name]. is a 72 y.o. black male with end stage renal disease on hemodialysis, CLL, diabetes mellitus type II, COPD, history of bladder cancer who is admitted to [facility] on 12/24/2017 for shortness of breath.   [facility] MWF [facility] L AVF 90.5kg  1.  ESRD on HD MWF. Patient seen during dialysis Tolerating well Patient would like to restart diuretic. Start Torsemide  2.  Anemia of chronic kidney disease.  Hemoglobin 9.1 - multiple PRBC transfusions previous admission - low dose EPO with HD treatment. IV venofer as outpatient.   3.  Secondary hyperparathyroidism   Discontinued auryxia - calcium acetate  At home  4.  Rt femoral DVT - Eliquis - manufacturer insert states no dose adjustment for ESRD - currently 10 BID x 1 week then, 5 BID  5. Shortness of breath: requiring oxygen. Baseline 3 L [place] - VQ scan neg for PE   LOS: 0 Nagee Goates 9/25/201911:04 AM

## 2017-12-26 NOTE — Consult Note (Addendum)
Consultation Note Date: 12/26/2017   Patient Name: Mark [name].  DOB: [birth year]  MRN: [id number]  Age / Sex: 72 y.o., male  PCP: Albina Billet, MD Referring Physician: Gladstone Lighter, MD  Reason for Consultation: Establishing goals of care  HPI/Patient Profile: The patient with past medical history of ESRD on dialysis, diabetes, bladder cancer, CLL and history of stroke presents emergency department complaining of shortness of breath.  Clinical Assessment and Goals of Care: Patient is sitting on the side of the bed with a meal tray in front of him.  He is upset about not being able to have a soda as he did not have coffee this morning. He has not eaten his hamburger as he states it is dry.  He does not wish to order anything different. [name] would like to speak with the dietitian. He is upset that his renal medications cannot be given the way he takes them at home according to the meal size. He states without the medications he would die.   We discussed his current diagnoses. He is able to communicate that he has a new DVT and that there was concern that he may have a PE causing his SOB. He states the lung scan was negative and he was almost disappointed when a PE was not the cause of his SOB, as at least that is treatable and he could get back to life as he knew it. He is concerned that this is progressive lung disease.  He began to cry when discussing frustrations with not being able to walk. He is concerned that PT plans to come this afternoon after he has just completed dialysis. He has concern for changes in his quality of life.   The concepts of palliative and hospice care were reviewed.   He would like to continue dialysis and to treat the treatable.   Recent palliative medicine meeting last admission.  SUMMARY OF RECOMMENDATIONS   Continue current care. Continue plans for  outpatient palliative.    Prognosis:   Poor overall.  Discharge Planning: To Be Determined      Primary Diagnoses: Present on Admission: . Hypoxia   I have reviewed the medical record, interviewed the patient and family, and examined the patient. The following aspects are pertinent.  Past Medical History:  Diagnosis Date  . Anemia   . Arthritis   . Bladder cancer ([place])   . Chronic kidney disease   . Diabetes mellitus without complication ([place])   . Dialysis patient [facility]    M,W,F  . Dyspnea    DOE  . GERD (gastroesophageal reflux disease)   . HOH (hard of hearing)   . Hypertension   . IBS (irritable bowel syndrome)   . Lymphoma ([place]) 09/27/2014  . Neuropathy    RIGHT LEG  . Stroke [facility])    TIA   Social History   Socioeconomic History  . Marital status: Married    Spouse name: Not on file  . Number of children: Not on file  .  Years of education: Not on file  . Highest education level: Not on file  Occupational History  . Occupation: retired  Scientific laboratory technician  . Financial resource strain: Not hard at all  . Food insecurity:    Worry: Never true    Inability: Never true  . Transportation needs:    Medical: No    Non-medical: No  Tobacco Use  . Smoking status: Former Smoker    Types: Cigarettes    Last attempt to quit: 05/19/2013    Years since quitting: 4.6  . Smokeless tobacco: Never Used  . Tobacco comment: quit  Substance and Sexual Activity  . Alcohol use: No    Alcohol/week: 0.0 standard drinks  . Drug use: No  . Sexual activity: Not on file  Lifestyle  . Physical activity:    Days per week: 0 days    Minutes per session: 0 min  . Stress: To some extent  Relationships  . Social connections:    Talks on phone: Twice a week    Gets together: Twice a week    Attends religious service: Never    Active member of club or organization: Yes    Attends meetings of clubs or organizations: Never    Relationship status: Married  Other Topics Concern    . Not on file  Social History Narrative   Lives with wife   Family History  Problem Relation Age of Onset  . Cancer Mother   . Kidney cancer Neg Hx   . Prostate cancer Neg Hx   . Kidney failure Neg Hx   . Bladder Cancer Neg Hx    Scheduled Meds: . amLODipine  10 mg Oral Daily  . apixaban  10 mg Oral BID   Followed by  . [START ON 01/01/2018] apixaban  5 mg Oral BID  . aspirin EC  81 mg Oral Daily  . Chlorhexidine Gluconate Cloth  6 each Topical Q0600  . Difluprednate  1 drop Left Eye BID  . docusate sodium  100 mg Oral BID  . epoetin (EPOGEN/PROCRIT) injection  4,000 Units Intravenous Q M,W,F-HD  . gabapentin  100 mg Oral TID  . insulin aspart  0-15 Units Subcutaneous TID WC  . insulin aspart  3 Units Subcutaneous TID WC  . insulin glargine  10 Units Subcutaneous Daily  . ipratropium-albuterol  3 mL Nebulization TID  . metoCLOPramide  5 mg Oral TID AC  . multivitamin  1 tablet Oral QHS  . pravastatin  20 mg Oral q1800  . [START ON 12/27/2017] predniSONE  20 mg Oral Q breakfast   Followed by  . [START ON 12/28/2017] predniSONE  10 mg Oral Q breakfast  . torsemide  100 mg Oral Daily   Continuous Infusions: PRN Meds:.acetaminophen **OR** acetaminophen, albuterol, docusate sodium, fluticasone, nystatin cream, ondansetron **OR** ondansetron (ZOFRAN) IV, polyvinyl alcohol, silver sulfADIAZINE Medications Prior to Admission:  Prior to Admission medications   Medication Sig Start Date End Date Taking? Authorizing Provider  albuterol (PROVENTIL) (2.5 MG/3ML) 0.083% nebulizer solution Take 2.5 mg by nebulization every 6 (six) hours as needed for wheezing or shortness of breath.   Yes [provider]  amLODipine (NORVASC) 10 MG tablet Take 10 mg by mouth daily.  06/02/13  Yes [provider]  aspirin 81 MG EC tablet Take 81 mg by mouth daily.    Yes [provider]  Difluprednate (DUREZOL) 0.05 % EMUL Place 1 drop into the left eye 2 (two) times daily.    Yes  [provider]  docusate sodium (COLACE) 100 MG capsule Take 100 mg by mouth daily as needed for mild constipation.   Yes [provider]  ENSURE PLUS (ENSURE PLUS) LIQD Take 1 Can by mouth daily.  01/26/14  Yes [provider]  fluticasone (FLONASE) 50 MCG/ACT nasal spray Place 1 spray into both nostrils daily as needed for allergies.  12/14/14  Yes [provider]  gabapentin (NEURONTIN) 100 MG capsule Take 1 capsule (100 mg total) by mouth 3 (three) times daily. 03/02/16  Yes Edrick Kins, DPM  glimepiride (AMARYL) 4 MG tablet Take 4 mg by mouth at bedtime. Prn glucose levels 10/06/14  Yes [provider]  LEVEMIR FLEXTOUCH 100 UNIT/ML Pen Inject 5-6 Units into the skin daily at 10 pm.  08/21/14  Yes [provider]  lovastatin (MEVACOR) 20 MG tablet Take 1 tablet by mouth daily. 12/25/16  Yes [provider]  metoCLOPramide (REGLAN) 5 MG tablet Take 5 mg by mouth 3 (three) times daily before meals. Patient takes 1 tab with each meal.   Yes [provider]  multivitamin (RENA-VIT) TABS tablet Take 1 tablet by mouth at bedtime.    Yes [provider]  nystatin cream (MYCOSTATIN) Apply 1 application topically 2 (two) times daily as needed (irritation).    Yes [provider]  Polyvinyl Alcohol-Povidone PF (REFRESH) 1.4-0.6 % SOLN Apply 1 drop to eye 3 (three) times daily as needed (Dry eyes).   Yes [provider]  predniSONE (DELTASONE) 10 MG tablet Take 1 tablet (10 mg total) by mouth daily. Label  & dispense according to the schedule below.  6 tablets day one, then 5 table day 2, then 4 tablets day 3, then 3 tablets day 4, 2 tablets day 5, then 1 tablet day 6, then stop 12/23/17  Yes Pyreddy, Pavan, MD  silver sulfADIAZINE (SILVADENE) 1 % cream APPLY 1 APPLICATION TOPICALY DAILY Patient taking differently: Apply 1 application topically daily as needed (sores).  04/12/17  Yes Edrick Kins, DPM    Allergies  Allergen Reactions  . Heparin Nausea Only  . Ibuprofen Other (See Comments)    DUE TO DIALYSIS  . Multivitamin [Centrum] Other (See Comments)    DUE TO DIALYSIS  . Daypro [Oxaprozin] Swelling and Rash    Other reaction(s): Other (See Comments)  . Tape Rash   Review of Systems  Neurological: Positive for weakness.    Physical Exam  Constitutional: No distress.  Neurological: He is alert.  Skin: Skin is warm and dry.    Vital Signs: BP (!) 129/40 (BP Location: Right Arm)   Pulse 69   Temp 98.5 F (36.9 C) (Oral)   Resp 18   Ht 5\' 11"  (1.803 m)   Wt 88.5 kg   SpO2 97%   BMI 27.21 kg/m  Pain Scale: 0-10   Pain Score: 0-No pain   SpO2: SpO2: 97 % O2 Device:SpO2: 97 % O2 Flow Rate: .O2 Flow Rate (L/min): 3.5 L/min  IO: Intake/output summary:   Intake/Output Summary (Last 24 hours) at 12/26/2017 1550 Last data filed at 12/26/2017 1320 Gross per 24 hour  Intake 120 ml  Output 905 ml  Net -785 ml    LBM: Last BM Date: 12/25/17 Baseline Weight: Weight: 77 kg Most recent weight: Weight: 88.5 kg     Palliative Assessment/Data:     Time In: 3:30 Time Out: 4:10 Time Total: 40 min Greater than 50%  of this time was spent counseling and  coordinating care related to the above assessment and plan.  Signed by: Asencion Gowda, NP   Please contact Palliative Medicine Team phone at [phone] for questions and concerns.  For individual provider: See Shea Evans

## 2017-12-26 NOTE — Plan of Care (Addendum)
PMT note:  Patient is in dialysis. Will return.

## 2017-12-26 NOTE — Progress Notes (Signed)
Post HD Treatment  HD treatment was well throughout treatment until the last 15 minutes. During the last 15 minutes patient's blood pressure started to drop. MD aware. Patient did not meet his UF goal. His Net UF was 905. His BVP was 79.4 and Kt is 60.6. His mean KECN and 289. All of  his blood was returned. Report called and given to primary nurse.     12/26/17 1320  Hand-Off documentation  Report given to (Full Name) Collier Bullock, RN  Report received from (Full Name) Stephannie Peters, RN  Vital Signs  Temp 98.3 F (36.8 C)  Temp Source Oral  Pulse Rate 88  Pulse Rate Source Monitor  Resp 20  BP (!) 136/46  BP Location Right Leg  BP Method Automatic  Patient Position (if appropriate) Lying  Oxygen Therapy  SpO2 100 %  O2 Device Nasal Cannula  O2 Flow Rate (L/min) 3.5 L/min  Pain Assessment  Pain Scale 0-10  Pain Score 0  Dialysis Weight  Weight 88.5 kg  Type of Weight Post-Dialysis  Post-Hemodialysis Assessment  Rinseback Volume (mL) 250 mL  KECN 289 V  Dialyzer Clearance Lightly streaked  Duration of HD Treatment -hour(s) 3.5 hour(s)  Hemodialysis Intake (mL) 500 mL  UF Total -Machine (mL) 1405 mL  Net UF (mL) 905 mL  Tolerated HD Treatment Yes  AVG/AVF Arterial Site Held (minutes) 10 minutes  AVG/AVF Venous Site Held (minutes) 10 minutes  Fistula / Graft Left Forearm Arteriovenous fistula  No Placement Date or Time found.   Placed prior to admission: No  Orientation: Left  Access Location: Forearm  Access Type: Arteriovenous fistula  Site Condition No complications  Fistula / Graft Assessment Present;Thrill;Bruit  Drainage Description None

## 2017-12-26 NOTE — Progress Notes (Signed)
Pre HD Treatment    12/26/17 0930  Vital Signs  Temp 97.7 F (36.5 C)  Temp Source Oral  Pulse Rate 83  Pulse Rate Source Monitor  Resp 15  BP 137/65  BP Location Right Leg  BP Method Automatic  Patient Position (if appropriate) Lying  Oxygen Therapy  SpO2 95 %  O2 Device Nasal Cannula  O2 Flow Rate (L/min) 3.5 L/min  Pain Assessment  Pain Scale 0-10  Pain Score 0  Dialysis Weight  Weight 89.7 kg  Type of Weight Pre-Dialysis  Time-Out for Hemodialysis  What Procedure? HD  Pt Identifiers(min of two) First/Last Name;MRN/Account#;Pt's DOB(use if MRN/Acct# not available  Correct Site? Yes  Correct Side? Yes  Correct Procedure? Yes  Consents Verified? Yes  Rad Studies Available? N/A  Safety Precautions Reviewed? Yes  Engineer, civil (consulting) Number [phone]  Station Number 2  UF/Alarm Test Passed  Conductivity: Meter 14  Conductivity: Machine  14  pH 7.4  Reverse Osmosis Main  Normal Saline Lot Number [id number]  Dialyzer Lot Number [id number]  Disposable Set Lot Number [id number]  Machine Temperature 98.6 F (37 C)  Musician and Audible Yes  Blood Lines Intact and Secured Yes  Pre Treatment Patient Checks  Vascular access used during treatment Fistula  Hepatitis B Surface Antigen Results Negative  Date Hepatitis B Surface Antigen Drawn 04/30/17  Hepatitis B Surface Antibody 4  Date Hepatitis B Surface Antibody Drawn 04/30/17  Hemodialysis Consent Verified Yes  Hemodialysis Standing Orders Initiated Yes  ECG (Telemetry) Monitor On Yes  Prime Ordered Normal Saline  Length of  DialysisTreatment -hour(s) 3.5 Hour(s)  Dialyzer Elisio 17H NR  Dialysate 3K, 2.5 Ca  Dialysis Anticoagulant None  Dialysate Flow Ordered 800  Blood Flow Rate Ordered 400 mL/min  Ultrafiltration Goal 1 Liters  Pre Treatment Labs CBC;Other (Comment)  Dialysis Blood Pressure Support Ordered Normal Saline  Education / Care Plan  Dialysis Education Provided Yes  Documented Education in  Care Plan Yes  Fistula / Graft Left Forearm Arteriovenous fistula  No Placement Date or Time found.   Placed prior to admission: No  Orientation: Left  Access Location: Forearm  Access Type: Arteriovenous fistula  Site Condition No complications  Fistula / Graft Assessment Present;Thrill;Bruit  Drainage Description None

## 2017-12-26 NOTE — Progress Notes (Signed)
[facility] [place] NAME: Mark [name]    MR#:  [id number]  DATE OF BIRTH:  1945-10-17  SUBJECTIVE:  CHIEF COMPLAINT:   Chief Complaint  Patient presents with  . Shortness of Breath   - for dialysis today - multiple complaints about his diet - PT pending, on 3l o2 now  REVIEW OF SYSTEMS:  Review of Systems  Constitutional: Positive for malaise/fatigue. Negative for chills and fever.  HENT: Negative for ear discharge, hearing loss and nosebleeds.   Eyes: Negative for blurred vision and double vision.  Respiratory: Positive for shortness of breath. Negative for cough and wheezing.   Cardiovascular: Negative for chest pain and palpitations.  Gastrointestinal: Negative for abdominal pain, constipation, diarrhea, nausea and vomiting.  Genitourinary: Negative for dysuria.  Musculoskeletal: Negative for myalgias.  Neurological: Negative for dizziness, focal weakness, seizures, weakness and headaches.  Psychiatric/Behavioral: Negative for depression.    DRUG ALLERGIES:   Allergies  Allergen Reactions  . Heparin Nausea Only  . Ibuprofen Other (See Comments)    DUE TO DIALYSIS  . Multivitamin [Centrum] Other (See Comments)    DUE TO DIALYSIS  . Daypro [Oxaprozin] Swelling and Rash    Other reaction(s): Other (See Comments)  . Tape Rash    VITALS:  Blood pressure (!) 129/40, pulse 69, temperature 98.5 F (36.9 C), temperature source Oral, resp. rate 18, height 5\' 11"  (1.803 m), weight 88.5 kg, SpO2 97 %.  PHYSICAL EXAMINATION:  Physical Exam  GENERAL:  72 y.o.-year-old patient lying in the bed with no acute distress.  EYES: Pupils equal, round, reactive to light and accommodation. right eye erythematous conjunctiva. No scleral icterus. Extraocular muscles intact.  HEENT: Head atraumatic, normocephalic. Oropharynx and nasopharynx clear.  NECK:  Supple, no jugular venous distention. No thyroid enlargement, no tenderness.    LUNGS: Normal breath sounds bilaterally, no wheezing, rales,rhonchi or crepitation. No use of accessory muscles of respiration. Decreased bibasilar breath sounds CARDIOVASCULAR: S1, S2 normal. No  rubs, or gallops. 3/6 systolic murmur present ABDOMEN: Soft, nontender, nondistended. Bowel sounds present. No organomegaly or mass.  EXTREMITIES: No pedal edema, cyanosis, or clubbing. Left forearm AV fistula NEUROLOGIC: Cranial nerves II through XII are intact. Muscle strength 5/5 in all extremities. Sensation intact. Gait not checked.  PSYCHIATRIC: The patient is alert and oriented x 3. Global weakness noted SKIN: No obvious rash, lesion, or ulcer.    LABORATORY PANEL:   CBC Recent Labs  Lab 12/24/17 2122  WBC 6.1  HGB 9.1*  HCT 27.4*  PLT 159   ------------------------------------------------------------------------------------------------------------------  Chemistries  Recent Labs  Lab 12/24/17 2122  NA 133*  K 4.7  CL 93*  CO2 29  GLUCOSE 428*  BUN 51*  CREATININE 4.13*  CALCIUM 7.8*  AST 17  ALT 23  ALKPHOS 82  BILITOT 1.1   ------------------------------------------------------------------------------------------------------------------  Cardiac Enzymes Recent Labs  Lab 12/24/17 2122  TROPONINI 0.04*   ------------------------------------------------------------------------------------------------------------------  RADIOLOGY:  Dg Chest 2 View  Result Date: 12/24/2017 CLINICAL DATA:  Acute onset shortness of breath. EXAM: CHEST - 2 VIEW COMPARISON:  12/18/2017 and 12/15/2017 FINDINGS: Lungs are adequately inflated demonstrate a small right-sided pleural effusion likely with associated right basilar atelectasis unchanged. Interval improved left base opacification. Cardiomediastinal silhouette and remainder of the exam is unchanged. IMPRESSION: Stable small right pleural effusion likely with associated right basilar atelectasis. Near resolution of left base  opacification. Electronically Signed   By: Marin Olp M.D.   On: 12/24/2017 21:43  Nm Pulmonary Perf And Vent  Result Date: 12/25/2017 CLINICAL DATA:  Chest pain. EXAM: NUCLEAR MEDICINE VENTILATION - PERFUSION LUNG SCAN TECHNIQUE: Ventilation images were obtained in multiple projections using inhaled aerosol Tc-86m DTPA. Perfusion images were obtained in multiple projections after intravenous injection of Tc-45m-MAA. RADIOPHARMACEUTICALS:  34.4 mCi of Tc-60m DTPA aerosol inhalation and 4.3 mCi Tc56m-MAA IV COMPARISON:  Chest x-ray 12/24/2017. FINDINGS: No ventilation perfusion mismatches noted. Mild ventilatory defects and perfusion defects are noted within the tori defects being larger. These are primarily in the bases and most likely related to previously identified basilar atelectasis and pleural effusions. IMPRESSION: Low probability pulmonary embolus. Electronically Signed   By: Marcello Moores  Register   On: 12/25/2017 11:58   US Venous Img Lower Bilateral  Result Date: 12/25/2017 CLINICAL DATA:  Bilateral lower extremity pain/cramping for 1 day with dyspnea. EXAM: BILATERAL LOWER EXTREMITY VENOUS DOPPLER ULTRASOUND TECHNIQUE: Gray-scale sonography with graded compression, as well as color Doppler and duplex ultrasound were performed to evaluate the lower extremity deep venous systems from the level of the common femoral vein and including the common femoral, femoral, profunda femoral, popliteal and calf veins including the posterior tibial, peroneal and gastrocnemius veins when visible. The superficial great saphenous vein was also interrogated. Spectral Doppler was utilized to evaluate flow at rest and with distal augmentation maneuvers in the common femoral, femoral and popliteal veins. COMPARISON:  Left lower extremity venous Doppler scan dated 01/15/2017 FINDINGS: RIGHT LOWER EXTREMITY Common Femoral Vein: No evidence of thrombus. Normal compressibility, respiratory phasicity and response to  augmentation. Saphenofemoral Junction: No evidence of thrombus. Normal compressibility and flow on color Doppler imaging. Profunda Femoral Vein: No evidence of thrombus. Normal compressibility and flow on color Doppler imaging. Femoral Vein: Nonocclusive thrombosis of the midportion of the right femoral vein. Patent compressible proximal and distal portions of the right femoral vein. Popliteal Vein: No evidence of thrombus. Normal compressibility, respiratory phasicity and response to augmentation. Calf Veins: No evidence of thrombus. Normal compressibility and flow on color Doppler imaging. Superficial Great Saphenous Vein: No evidence of thrombus. Normal compressibility. Venous Reflux:  None. Other Findings:  None. LEFT LOWER EXTREMITY Common Femoral Vein: No evidence of thrombus. Normal compressibility, respiratory phasicity and response to augmentation. Saphenofemoral Junction: No evidence of thrombus. Normal compressibility and flow on color Doppler imaging. Profunda Femoral Vein: No evidence of thrombus. Normal compressibility and flow on color Doppler imaging. Femoral Vein: No evidence of thrombus. Normal compressibility, respiratory phasicity and response to augmentation. Popliteal Vein: No evidence of thrombus. Normal compressibility, respiratory phasicity and response to augmentation. Calf Veins: No evidence of thrombus. Normal compressibility and flow on color Doppler imaging. Superficial Great Saphenous Vein: No evidence of thrombus. Normal compressibility. Venous Reflux:  None. Other Findings:  None. IMPRESSION: 1. Nonocclusive deep venous thrombosis of the midportion of the right femoral vein. 2. Otherwise patent deep veins of the lower extremities bilaterally. Electronically Signed   By: Ilona Sorrel M.D.   On: 12/25/2017 03:00    EKG:   Orders placed or performed during the hospital encounter of 12/24/17  . ED EKG  . ED EKG  . EKG 12-Lead  . EKG 12-Lead    ASSESSMENT AND PLAN:   72 y/o  Patient with end-stage renal disease on hemodialysis, diabetes, bladder cancer, CLL and history of stroke was just discharged from the hospital 2 days ago comes again with hypoxia  1. Non occlusive DVT of right femoral vein- started on eliquis - allergic to heparin - monitor for any  bleeding, known h/o anemia  2. Dyspnea/ hypoxia- chronic pulm edema  CXR clear, atelectasis - VQ scan with low probability for PE - incentive spirometry for now - on 2L chronic home o2 - continue prednisone taper  3. ESRD- on MWF HD - appreciate nephrology consult- dialysis today  4. CLL- h/o bladder cancer, new right breast mass on recent PET scan- continue outpatient  5. DM- lantus, SSI and novolog  PT consult pending  All the records are reviewed and case discussed with Care Management/Social Workerr. Management plans discussed with the patient, family and they are in agreement.  CODE STATUS: Partial code  TOTAL TIME TAKING CARE OF THIS PATIENT: 37 minutes.   POSSIBLE D/C IN 1-2 DAYS, DEPENDING ON CLINICAL CONDITION.   Gladstone Lighter M.D on 12/26/2017 at 5:02 PM  Between 7am to 6pm - Pager - [phone]  After 6pm go to [email or web address] - password [email or web address] [facility]  Office  [phone]  CC: Primary care physician; Albina Billet, MD

## 2017-12-26 NOTE — Progress Notes (Signed)
HD Post Assessment    12/26/17 1315  Neurological  Level of Consciousness Alert  Orientation Level Oriented X4  Respiratory  Respiratory Pattern Regular;Unlabored;Symmetrical  Chest Assessment Chest expansion symmetrical  Bilateral Breath Sounds Diminished  Cardiac  Pulse Regular  Heart Sounds Murmur  Jugular Venous Distention (JVD) No  ECG Monitor Yes  Cardiac Rhythm NSR  Vascular  R Radial Pulse +2  L Radial Pulse +2  R Dorsalis Pedis Pulse +2  L Dorsalis Pedis Pulse +2  Generalized Edema None  Integumentary  Integumentary (WDL) X  Skin Color Appropriate for ethnicity  Skin Condition Dry  Skin Integrity Abrasion;Ecchymosis  Ecchymosis Location Abdomen;Flank  Ecchymosis Location Orientation Left;Lateral  Musculoskeletal  Musculoskeletal (WDL) X  Generalized Weakness Yes  GU Assessment  Genitourinary (WDL) X (HD pt)  Psychosocial  Psychosocial (WDL) WDL

## 2017-12-27 ENCOUNTER — Ambulatory Visit: Payer: Medicare HMO | Admitting: Internal Medicine

## 2017-12-27 DIAGNOSIS — Z515 Encounter for palliative care: Secondary | ICD-10-CM | POA: Diagnosis not present

## 2017-12-27 DIAGNOSIS — R0902 Hypoxemia: Secondary | ICD-10-CM | POA: Diagnosis not present

## 2017-12-27 LAB — GLUCOSE, CAPILLARY
Glucose-Capillary: 215 mg/dL — ABNORMAL HIGH (ref 70–99)
Glucose-Capillary: 183 mg/dL — ABNORMAL HIGH (ref 70–99)
Glucose-Capillary: 212 mg/dL — ABNORMAL HIGH (ref 70–99)
Glucose-Capillary: 252 mg/dL — ABNORMAL HIGH (ref 70–99)

## 2017-12-27 MED ORDER — APIXABAN 5 MG PO TABS: 10.0000 mg | ORAL_TABLET | Freq: Two times a day (BID) | ORAL | 0 refills | Status: DC

## 2017-12-27 MED ORDER — INSULIN GLARGINE 100 UNIT/ML ~~LOC~~ SOLN
15.0000 [IU] | Freq: Every day | SUBCUTANEOUS | Status: DC
  Administered 2017-12-27: 12:00:00 15 [IU] via SUBCUTANEOUS
  Filled 2017-12-27: qty 0.15

## 2017-12-27 MED ORDER — BUDESONIDE-FORMOTEROL FUMARATE 160-4.5 MCG/ACT IN AERO: 2.0000 | INHALATION_SPRAY | Freq: Two times a day (BID) | RESPIRATORY_TRACT | 12 refills | Status: DC

## 2017-12-27 MED ORDER — TORSEMIDE 100 MG PO TABS: 100.0000 mg | ORAL_TABLET | Freq: Every day | ORAL | 0 refills | Status: DC

## 2017-12-27 MED ORDER — APIXABAN 5 MG PO TABS: 5.0000 mg | ORAL_TABLET | Freq: Two times a day (BID) | ORAL | 2 refills | Status: DC

## 2017-12-27 MED ORDER — PREDNISONE 10 MG PO TABS: ORAL_TABLET | ORAL | 0 refills | Status: DC

## 2017-12-27 NOTE — Care Management (Addendum)
Discharge to home today per Dr. Tressia Miners. Physical therapy is recommending skilled nursing facility today. [name] is declining skilled nursing at this time. Will be followed by [facility]. Glennis Brink, [facility] representative updated.  States he has his Eliquis coupon and bedside commode. Family will transport [name] RN MSN CCM Care Management [phone]

## 2017-12-27 NOTE — Discharge Summary (Addendum)
[facility] [place] NAME: Mark [name]    MR#:  [id number]  [place]:  1945/08/01  DATE OF ADMISSION:  12/24/2017   ADMITTING PHYSICIAN: Harrie Foreman, MD  DATE OF DISCHARGE: 12/27/2017  1:46 PM  PRIMARY CARE PHYSICIAN: Albina Billet, MD   ADMISSION DIAGNOSIS:   Acute deep vein thrombosis (DVT) of femoral vein of right lower extremity ([place]) [I82.411]  DISCHARGE DIAGNOSIS:   Active Problems:   Hypoxia   SECONDARY DIAGNOSIS:   Past Medical History:  Diagnosis Date  . Anemia   . Arthritis   . Bladder cancer ([place])   . Chronic kidney disease   . Diabetes mellitus without complication ([place])   . Dialysis patient [facility])    M,W,F  . Dyspnea    DOE  . GERD (gastroesophageal reflux disease)   . HOH (hard of hearing)   . Hypertension   . IBS (irritable bowel syndrome)   . Lymphoma ([place]) 09/27/2014  . Neuropathy    RIGHT LEG  . Stroke [facility])    TIA    HOSPITAL COURSE:   72 y/o Patient with end-stage renal disease on hemodialysis, diabetes, bladder cancer, CLL and history of stroke was just discharged from the hospital 2 days ago comes again with hypoxia  1. Non occlusive DVT of right femoral vein- started on eliquis- DVT starter pack - allergic to heparin - monitor for any bleeding, known h/o anemia  2. Dyspnea/ hypoxia- due chronic pulm edema and atelectasis and poor inspiratory effort  CXR clear, but atelectasis - VQ scan with low probability for PE - incentive spirometry for now - on 2L chronic home o2- now needing 3L at rest and upto 4L on exertion. Incentive spirometry - continue prednisone taper at discharge - torsemide on non dialysis days  3. ESRD- on MWF HD - appreciate nephrology consult- dialysis on 12/26/17 per schedule  4. CLL- h/o bladder cancer, new right breast mass on recent PET scan- continue outpatient f/u with oncology  5. DM- glimiperide, levemir  6. HTN- low normal BP here- so  discontinued norvasc. Monitor as outpatient  PT consult recommended home health Will be discharged today  DISCHARGE CONDITIONS:   Guarded  CONSULTS OBTAINED:   Nephrology consult by Dr. Murlean Iba  DRUG ALLERGIES:   Allergies  Allergen Reactions  . Heparin Nausea Only  . Ibuprofen Other (See Comments)    DUE TO DIALYSIS  . Multivitamin [Centrum] Other (See Comments)    DUE TO DIALYSIS  . Daypro [Oxaprozin] Swelling and Rash    Other reaction(s): Other (See Comments)  . Tape Rash   DISCHARGE MEDICATIONS:   Allergies as of 12/27/2017      Reactions   Heparin Nausea Only   Ibuprofen Other (See Comments)   DUE TO DIALYSIS   Multivitamin [centrum] Other (See Comments)   DUE TO DIALYSIS   Daypro [oxaprozin] Swelling, Rash   Other reaction(s): Other (See Comments)   Tape Rash      Medication List    STOP taking these medications   amLODipine 10 MG tablet Commonly known as:  NORVASC   aspirin 81 MG EC tablet     TAKE these medications   albuterol (2.5 MG/3ML) 0.083% nebulizer solution Commonly known as:  PROVENTIL Take 2.5 mg by nebulization every 6 (six) hours as needed for wheezing or shortness of breath.   apixaban 5 MG Tabs tablet Commonly known as:  ELIQUIS Take 2 tablets (10 mg  total) by mouth 2 (two) times daily for 5 days.   apixaban 5 MG Tabs tablet Commonly known as:  ELIQUIS Take 1 tablet (5 mg total) by mouth 2 (two) times daily. Start taking on:  01/01/2018   budesonide-formoterol 160-4.5 MCG/ACT inhaler Commonly known as:  SYMBICORT Inhale 2 puffs into the lungs 2 (two) times daily.   docusate sodium 100 MG capsule Commonly known as:  COLACE Take 100 mg by mouth daily as needed for mild constipation.   DUREZOL 0.05 % Emul Generic drug:  Difluprednate Place 1 drop into the left eye 2 (two) times daily.   ENSURE PLUS Liqd Take 1 Can by mouth daily.   fluticasone 50 MCG/ACT nasal spray Commonly known as:  FLONASE Place 1 spray into  both nostrils daily as needed for allergies.   gabapentin 100 MG capsule Commonly known as:  NEURONTIN Take 1 capsule (100 mg total) by mouth 3 (three) times daily.   glimepiride 4 MG tablet Commonly known as:  AMARYL Take 4 mg by mouth at bedtime. Prn glucose levels   LEVEMIR FLEXTOUCH 100 UNIT/ML Pen Generic drug:  Insulin Detemir Inject 5-6 Units into the skin daily at 10 pm.   lovastatin 20 MG tablet Commonly known as:  MEVACOR Take 1 tablet by mouth daily.   metoCLOPramide 5 MG tablet Commonly known as:  REGLAN Take 5 mg by mouth 3 (three) times daily before meals. Patient takes 1 tab with each meal.   multivitamin Tabs tablet Take 1 tablet by mouth at bedtime.   nystatin cream Commonly known as:  MYCOSTATIN Apply 1 application topically 2 (two) times daily as needed (irritation).   predniSONE 10 MG tablet Commonly known as:  DELTASONE Take 4 tab PO daily x 1 day and taper off by 10 every day What changed:    how much to take  how to take this  when to take this  additional instructions   REFRESH 1.4-0.6 % Soln Generic drug:  Polyvinyl Alcohol-Povidone PF Apply 1 drop to eye 3 (three) times daily as needed (Dry eyes).   silver sulfADIAZINE 1 % cream Commonly known as:  SILVADENE APPLY 1 APPLICATION TOPICALY DAILY What changed:  See the new instructions.   torsemide 100 MG tablet Commonly known as:  DEMADEX Take 1 tablet (100 mg total) by mouth daily. Please take on Non dialysis days- Tues, Thurs, Saturday and sunday Start taking on:  12/28/2017            Durable Medical Equipment  (From admission, onward)         Start     Ordered   12/27/17 1112  For home use only DME Nebulizer machine  Once    Question:  Patient needs a nebulizer to treat with the following condition  Answer:  COPD (chronic obstructive pulmonary disease) ([place])   12/27/17 1114   12/25/17 1444  For home use only DME Bedside commode  Once    Question:  Patient needs a bedside  commode to treat with the following condition  Answer:  Ataxia   12/25/17 1444           DISCHARGE INSTRUCTIONS:   1. PCP f/u in 1-2 weeks 2. For dialysis tomorrow per schedule 3.  Palliative care follow-up at discharge  DIET:   Renal diet  ACTIVITY:   Activity as tolerated  OXYGEN:   Home Oxygen: Yes Oxygen Delivery: 3 liters/min via Patient connected to nasal cannula oxygen  DISCHARGE LOCATION:   home  If you experience worsening of your admission symptoms, develop shortness of breath, life threatening emergency, suicidal or homicidal thoughts you must seek medical attention immediately by calling 911 or calling your MD immediately  if symptoms less severe.  You Must read complete instructions/literature along with all the possible adverse reactions/side effects for all the Medicines you take and that have been prescribed to you. Take any new Medicines after you have completely understood and accpet all the possible adverse reactions/side effects.   Please note  You were cared for by a hospitalist during your hospital stay. If you have any questions about your discharge medications or the care you received while you were in the hospital after you are discharged, you can call the unit and asked to speak with the hospitalist on call if the hospitalist that took care of you is not available. Once you are discharged, your primary care physician will handle any further medical issues. Please note that NO REFILLS for any discharge medications will be authorized once you are discharged, as it is imperative that you return to your primary care physician (or establish a relationship with a primary care physician if you do not have one) for your aftercare needs so that they can reassess your need for medications and monitor your lab values.    On the day of Discharge:  VITAL SIGNS:   Blood pressure 133/64, pulse 93, temperature 98.1 F (36.7 C), temperature source Oral, resp.  rate 18, height 5\' 11"  (1.803 m), weight 86.5 kg, SpO2 95 %.  PHYSICAL EXAMINATION:   GENERAL:  72 y.o.-year-old patient lying in the bed with no acute distress.  EYES: Pupils equal, round, reactive to light and accommodation. right eye erythematous conjunctiva. No scleral icterus. Extraocular muscles intact.  HEENT: Head atraumatic, normocephalic. Oropharynx and nasopharynx clear.  NECK:  Supple, no jugular venous distention. No thyroid enlargement, no tenderness.  LUNGS: Normal breath sounds bilaterally, no wheezing, rales,rhonchi or crepitation. No use of accessory muscles of respiration. Decreased bibasilar breath sounds CARDIOVASCULAR: S1, S2 normal. No  rubs, or gallops. 3/6 systolic murmur present ABDOMEN: Soft, nontender, nondistended. Bowel sounds present. No organomegaly or mass.  EXTREMITIES: No pedal edema, cyanosis, or clubbing. Left forearm AV fistula NEUROLOGIC: Cranial nerves II through XII are intact. Muscle strength 5/5 in all extremities. Sensation intact. Gait not checked.  PSYCHIATRIC: The patient is alert and oriented x 3. Global weakness noted SKIN: No obvious rash, lesion, or ulcer.   DATA REVIEW:   CBC Recent Labs  Lab 12/24/17 2122  WBC 6.1  HGB 9.1*  HCT 27.4*  PLT 159    Chemistries  Recent Labs  Lab 12/24/17 2122  NA 133*  K 4.7  CL 93*  CO2 29  GLUCOSE 428*  BUN 51*  CREATININE 4.13*  CALCIUM 7.8*  AST 17  ALT 23  ALKPHOS 82  BILITOT 1.1     Microbiology Results  Results for orders placed or performed during the hospital encounter of 12/24/17  MRSA PCR Screening     Status: None   Collection Time: 12/25/17  9:58 AM  Result Value Ref Range Status   MRSA by PCR NEGATIVE NEGATIVE Final    Comment:        The GeneXpert MRSA Assay (FDA approved for NASAL specimens only), is one component of a comprehensive MRSA colonization surveillance program. It is not intended to diagnose MRSA infection nor to guide or monitor treatment  for MRSA infections. Performed at [facility], [address] [place], [place] [address]     RADIOLOGY:  No results found.   Management plans discussed with the patient, family and they are in agreement.  CODE STATUS:     Code Status Orders  (From admission, onward)         Start     Ordered   12/25/17 0514  Limited resuscitation (code)  Continuous    Question Answer Comment  In the event of cardiac or respiratory ARREST: Initiate Code Blue, Call Rapid Response No   In the event of cardiac or respiratory ARREST: Perform CPR No   In the event of cardiac or respiratory ARREST: Perform Intubation/Mechanical Ventilation No   In the event of cardiac or respiratory ARREST: Use NIPPV/BiPAp only if indicated No   In the event of cardiac or respiratory ARREST: Administer ACLS medications if indicated No   In the event of cardiac or respiratory ARREST: Perform Defibrillation or Cardioversion if indicated No   Comments would want cardioversion      12/25/17 0514        Code Status History    Date Active Date Inactive Code Status Order ID Comments User Context   12/18/2017 1441 12/23/2017 1907 Partial Code [id number]  Asencion Gowda, NP Inpatient   01/15/2017 0423 01/18/2017 2120 Full Code [id number]  Saundra Shelling, MD Inpatient   10/20/2016 1627 10/22/2016 1817 Full Code [id number]  Lance Coon, MD Inpatient    Advance Directive Documentation     Most Recent Value  Type of Advance Directive  Healthcare Power of Attorney  Pre-existing out of facility DNR order (yellow form or pink MOST form)  -  "MOST" Form in Place?  -      TOTAL TIME TAKING CARE OF THIS PATIENT: 38 minutes.    Gladstone Lighter M.D on 12/27/2017 at 3:35 PM  Between 7am to 6pm - Pager - [phone]  After 6pm go to [email or web address] - Proofreader  [employer] [facility]  Office  [phone]  CC: Primary care physician; Albina Billet, MD   Note: This dictation was  prepared with Dragon dictation along with smaller phrase technology. Any transcriptional errors that result from this process are unintentional.

## 2017-12-27 NOTE — Progress Notes (Signed)
Daily Progress Note   Patient Name: Mark [name].       Date: 12/27/2017 DOB: 08/01/1945  Age: 72 y.o. MRN#: [id number] Attending Physician: Gladstone Lighter, MD Primary Care Physician: Albina Billet, MD Admit Date: 12/24/2017  Reason for Consultation/Follow-up: Psychosocial/spiritual support  Subjective: Patient is feeling better today than yesterday. He states he has more energy. He tells me he was able to work with therapy and is excited he is discharging home, and is hopeful not to return for a while.   Length of Stay: 0  Current Medications: Scheduled Meds:  . apixaban  10 mg Oral BID   Followed by  . [START ON 01/01/2018] apixaban  5 mg Oral BID  . aspirin EC  81 mg Oral Daily  . Chlorhexidine Gluconate Cloth  6 each Topical Q0600  . Difluprednate  1 drop Left Eye BID  . docusate sodium  100 mg Oral BID  . epoetin (EPOGEN/PROCRIT) injection  4,000 Units Intravenous Q M,W,F-HD  . gabapentin  100 mg Oral TID  . insulin aspart  0-15 Units Subcutaneous TID WC  . insulin aspart  3 Units Subcutaneous TID WC  . insulin glargine  15 Units Subcutaneous Daily  . ipratropium-albuterol  3 mL Nebulization TID  . metoCLOPramide  5 mg Oral TID AC  . multivitamin  1 tablet Oral QHS  . pravastatin  20 mg Oral q1800  . [START ON 12/28/2017] predniSONE  10 mg Oral Q breakfast  . torsemide  100 mg Oral Daily    Continuous Infusions:   PRN Meds: acetaminophen **OR** acetaminophen, albuterol, docusate sodium, fluticasone, nystatin cream, ondansetron **OR** ondansetron (ZOFRAN) IV, polyvinyl alcohol, silver sulfADIAZINE  Physical Exam  Constitutional: No distress.  Pulmonary/Chest: Effort normal.  Neurological: He is alert.            Vital Signs: BP 133/64 (BP Location: Left  Leg)   Pulse 93   Temp 98.1 F (36.7 C) (Oral)   Resp 18   Ht 5\' 11"  (1.803 m)   Wt 86.5 kg   SpO2 95%   BMI 26.60 kg/m  SpO2: SpO2: 95 % O2 Device: O2 Device: Nasal Cannula O2 Flow Rate: O2 Flow Rate (L/min): 3 L/min  Intake/output summary:   Intake/Output Summary (Last 24 hours) at 12/27/2017 1348 Last data filed at 12/27/2017  1016 Gross per 24 hour  Intake 240 ml  Output -  Net 240 ml   LBM: Last BM Date: 12/26/17 Baseline Weight: Weight: 77 kg Most recent weight: Weight: 86.5 kg       Palliative Assessment/Data:      Patient Active Problem List   Diagnosis Date Noted  . Hypoxia 12/25/2017  . Pressure injury of skin 12/16/2017  . Fluid overload 01/15/2017  . UTI (urinary tract infection) 10/20/2016  . Sepsis ([place]) 10/20/2016  . 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 Ulcer of foot, chronic ([place]) 03/21/2015  . History of bladder cancer 11/10/2014  . Penile bleeding 10/12/2014  . Dependence on renal dialysis ([place]) 08/18/2013  . Arterial blood pressure decreased 08/18/2013  . Leukemia ([place]) 08/18/2013  . Retina disorder 08/18/2013  . Breath shortness 08/18/2013  . End-stage renal disease on hemodialysis ([place]) 12/23/2012  . Absolute anemia 07/18/2012  . HPTH (hyperparathyroidism) ([place]) 07/18/2012  . Acidosis, metabolic 39/76/7341  . Hyperparathyroidism ([place]) 07/18/2012  . Abnormal presence of protein in urine 01/01/2012  . HLD (hyperlipidemia) 12/25/2011  . Diabetic retinopathy associated with type 2 diabetes mellitus ([place]) 10/26/1999  . Essential (primary) hypertension 04/04/1999  . Diabetes mellitus type 2, uncontrolled ([place]) 04/04/1999  . Type 2 diabetes mellitus with other diabetic kidney complication ([place]) 93/79/0240    Palliative Care Assessment & Plan    Recommendations/Plan:  Home with palliative to follow.    Code Status:    Code Status Orders  (From  admission, onward)         Start     Ordered   12/25/17 0514  Limited resuscitation (code)  Continuous    Question Answer Comment  In the event of cardiac or respiratory ARREST: Initiate Code Blue, Call Rapid Response No   In the event of cardiac or respiratory ARREST: Perform CPR No   In the event of cardiac or respiratory ARREST: Perform Intubation/Mechanical Ventilation No   In the event of cardiac or respiratory ARREST: Use NIPPV/BiPAp only if indicated No   In the event of cardiac or respiratory ARREST: Administer ACLS medications if indicated No   In the event of cardiac or respiratory ARREST: Perform Defibrillation or Cardioversion if indicated No   Comments would want cardioversion      12/25/17 0514        Code Status History    Date Active Date Inactive Code Status Order ID Comments User Context   12/18/2017 1441 12/23/2017 1907 Partial Code [id number]  Asencion Gowda, NP Inpatient   01/15/2017 0423 01/18/2017 2120 Full Code [id number]  Saundra Shelling, MD Inpatient   10/20/2016 1627 10/22/2016 1817 Full Code [id number]  Lance Coon, MD Inpatient    Advance Directive Documentation     Most Recent Value  Type of Advance Directive  Healthcare Power of Attorney  Pre-existing out of facility DNR order (yellow form or pink MOST form)  -  "MOST" Form in Place?  -       Prognosis:  Poor long term.  Discharge Planning:  Home with Palliative Services    Thank you for allowing the Palliative Medicine Team to assist in the care of this patient.   Total Time 15 min Prolonged Time Billed no      Greater than 50%  of this time was spent counseling and coordinating care related to the above assessment and plan.  Asencion Gowda, NP  Please contact Palliative Medicine Team phone  at [phone] for questions and concerns.

## 2017-12-27 NOTE — Progress Notes (Signed)
Physical Therapy Evaluation Patient Details Name: Mark [name]. MRN: [id number] DOB: 30-Apr-1945 Today's Date: 12/27/2017   History of Present Illness  The patient with past medical history of ESRD on dialysis, diabetes, bladder cancer, CLL and history of stroke presents emergency department complaining of shortness of breath.  The patient was recently discharged from this hospital following admission for altered mental status and possible pneumonia.  He was dialyzed on 6 consecutive days during his last hospital admission for fluid overload which is why concern to him today that he felt acutely short of breath.  He checked his oxygen saturations which were as low as 72 at times.  In the emergency department he required 3 L of oxygen via nasal cannula to maintain oxygen saturations greater than 92%.  Ultrasound of his right lower extremity revealed a nonocclusive DVT. Pt on now on Eliquis. Due to his continued oxygen requirement emergency department staff and call the hospitalist service for further management. He is currently admitted under observation for non-occlusive DVT of R femoral vein and dyspnea/hypoxia.  Clinical Impression  Pt admitted with above diagnosis. Pt currently with functional limitations due to the deficits listed below (see PT Problem List). Pt requires increased time, HOB elevated, and use of bed rails for supine to sit. He remains sitting at EOB at end of session with RN and MD in room. Pt demonstrates safe hand placement with transfers. Slow speed and requires UE support to stabilize in standing. Able to perform static marching with therapist. Pt takes short steps to ambulate a very limited distance down toward the end of bed with +2 present for added safety. Pt reports that he is unable to ambulate farther at this time and has to sit back down. SaO2 drops to 73% on 3L/min O2. After approximately 60s of pursed lip breathing SaO2 only in the low 80's so supplemental O2 increased  to 4L/min. SaO2 improves over the next 60s to 90% and pt returned to his baseline 3L/min. Pt reports DOE and fatigue following ambulation. MD present to observe oxygen desaturation. Recommend SNF placement given progressive weakness over the last 2 weeks. Pt currently refusing and prefers HH PT. Pt will benefit from PT services to address deficits in strength, balance, and mobility in order to return to full function at home.     Follow Up Recommendations SNF;Other (comment)(Pt refuses SNF placement, needs HH PT)    Equipment Recommendations  None recommended by PT    Recommendations for Other Services       Precautions / Restrictions Precautions Precautions: Fall Required Braces or Orthoses: Other Brace/Splint(Has RLE AFO which is chronic) Restrictions Weight Bearing Restrictions: No      Mobility  Bed Mobility Overal bed mobility: Modified Independent             General bed mobility comments: Pt requires increased time, HOB elevated, and use of bed rails. Remains sitting at EOB at end of session with RN and MD in room  Transfers Overall transfer level: Needs assistance Equipment used: Rolling walker (2 wheeled) Transfers: Stand Pivot Transfers   Stand pivot transfers: Min guard       General transfer comment: Pt demonstrates safe hand placement. Slow speed and requires UE support to stabilize in standing. Able to perform static marching with therapist  Ambulation/Gait Ambulation/Gait assistance: Min guard;+2 safety/equipment Gait Distance (Feet): 5 Feet Assistive device: Rolling walker (2 wheeled) Gait Pattern/deviations: Steppage     General Gait Details: Pt takes short steps to ambulate a  very limited distance down toward the end of bed with +2 present for added safety. Pt reports that he is unable to ambulate farther at this time and has to sit back down. SaO2 drops to 73% on 3L/min O2. After approximately 60s of pursed lip breathing SaO2 only in the low 80's so  supplemental O2 increased to 4L/min. SaO2 improves over the next 60s to 90% and pt returned to his baseline 3L/min. Pt reports DOE and fatigue following ambulation  Stairs            Wheelchair Mobility    Modified Rankin (Stroke Patients Only)       Balance Overall balance assessment: Needs assistance Sitting-balance support: No upper extremity supported Sitting balance-Leahy Scale: Good     Standing balance support: Bilateral upper extremity supported Standing balance-Leahy Scale: Fair                               Pertinent Vitals/Pain Pain Assessment: No/denies pain    Home Living Family/patient expects to be discharged to:: Private residence Living Arrangements: Spouse/significant other Available Help at Discharge: Family Type of Home: House Home Access: Ramped entrance     Home Layout: One level Home Equipment: Environmental consultant - 4 wheels;Wheelchair - Banker;Shower seat;Grab bars - tub/shower;Grab bars - toilet;Hand held shower head;Cane - quad;Cane - single point;Walker - 2 wheels;Bedside commode      Prior Function Level of Independence: Needs assistance   Gait / Transfers Assistance Needed: 2 weeks ago pt was able to ambulate limited distances in his house. Over the last two weeks he has gotten weaker and has been transfers only to/from wheelchair  ADL's / [place] Needed: Pt reports independence with ADLs but requires assist from spouse with IADLs        Hand Dominance   Dominant Hand: Right    Extremity/Trunk Assessment   Upper Extremity Assessment Upper Extremity Assessment: Overall WFL for tasks assessed    Lower Extremity Assessment Lower Extremity Assessment: RLE deficits/detail;Overall [facility] for tasks assessed RLE Deficits / Details: Pt with chronic R foot drop. 4-/5 bilateral hip flexion and 3/5 L ankle dorsiflexion. Pt reports history of LE neuropathy       Communication   Communication: No difficulties   Cognition Arousal/Alertness: Awake/alert Behavior During Therapy: Restless;Agitated Overall Cognitive Status: Within Functional Limits for tasks assessed                                        General Comments      Exercises     Assessment/Plan    PT Assessment Patient needs continued PT services  PT Problem List Decreased strength;Decreased mobility;Decreased activity tolerance;Decreased balance;Cardiopulmonary status limiting activity       PT Treatment Interventions Therapeutic activities;Gait training;Therapeutic exercise;Functional mobility training;Patient/family education;DME instruction;Balance training;Neuromuscular re-education    PT Goals (Current goals can be found in the Care Plan section)  Acute Rehab PT Goals Patient Stated Goal: to return home, get stronger PT Goal Formulation: With patient Time For Goal Achievement: 12/30/17 Potential to Achieve Goals: Fair    Frequency Min 2X/week   Barriers to discharge        Co-evaluation               AM-PAC PT "6 Clicks" Daily Activity  Outcome Measure Difficulty turning over in bed (including adjusting bedclothes, sheets  and blankets)?: A Little Difficulty moving from lying on back to sitting on the side of the bed? : A Little Difficulty sitting down on and standing up from a chair with arms (e.g., wheelchair, bedside commode, etc,.)?: A Little Help needed moving to and from a bed to chair (including a wheelchair)?: A Little Help needed walking in hospital room?: A Lot Help needed climbing 3-5 steps with a railing? : Total 6 Click Score: 15    End of Session Equipment Utilized During Treatment: Gait belt;Oxygen Activity Tolerance: Patient limited by fatigue Patient left: in bed;with call bell/phone within reach Nurse Communication: Mobility status PT Visit Diagnosis: Other abnormalities of gait and mobility (R26.89);Muscle weakness (generalized) (M62.81);Unsteadiness on feet  (R26.81);Difficulty in walking, not elsewhere classified (R26.2)    Time: 1610-9604 PT Time Calculation (min) (ACUTE ONLY): 23 min   Charges:   PT Evaluation $PT Eval Low Complexity: 1 Low PT Treatments $Therapeutic Activity: 8-22 mins        Veronnica Hennings D Thomson Herbers PT, DPT, GCS   Kieron Kantner 12/27/2017, 10:16 AM

## 2017-12-30 ENCOUNTER — Other Ambulatory Visit: Payer: Self-pay

## 2017-12-30 ENCOUNTER — Emergency Department: Payer: Medicare HMO

## 2017-12-30 ENCOUNTER — Inpatient Hospital Stay
Admission: EM | Admit: 2017-12-30 | Discharge: 2018-01-08 | DRG: 628 | Disposition: A | Discharge status: Skilled Nursing Facility | Payer: Medicare HMO | Attending: Internal Medicine | Admitting: Internal Medicine

## 2017-12-30 DIAGNOSIS — Z86718 Personal history of other venous thrombosis and embolism: Secondary | ICD-10-CM

## 2017-12-30 DIAGNOSIS — Z515 Encounter for palliative care: Secondary | ICD-10-CM | POA: Diagnosis present

## 2017-12-30 DIAGNOSIS — R040 Epistaxis: Secondary | ICD-10-CM | POA: Diagnosis not present

## 2017-12-30 DIAGNOSIS — E1110 Type 2 diabetes mellitus with ketoacidosis without coma: Secondary | ICD-10-CM | POA: Diagnosis not present

## 2017-12-30 DIAGNOSIS — I959 Hypotension, unspecified: Secondary | ICD-10-CM | POA: Diagnosis not present

## 2017-12-30 DIAGNOSIS — Z961 Presence of intraocular lens: Secondary | ICD-10-CM | POA: Diagnosis present

## 2017-12-30 DIAGNOSIS — R4182 Altered mental status, unspecified: Secondary | ICD-10-CM

## 2017-12-30 DIAGNOSIS — I82401 Acute embolism and thrombosis of unspecified deep veins of right lower extremity: Secondary | ICD-10-CM | POA: Diagnosis present

## 2017-12-30 DIAGNOSIS — D631 Anemia in chronic kidney disease: Secondary | ICD-10-CM | POA: Diagnosis present

## 2017-12-30 DIAGNOSIS — R7989 Other specified abnormal findings of blood chemistry: Secondary | ICD-10-CM

## 2017-12-30 DIAGNOSIS — Z4659 Encounter for fitting and adjustment of other gastrointestinal appliance and device: Secondary | ICD-10-CM

## 2017-12-30 DIAGNOSIS — Z79899 Other long term (current) drug therapy: Secondary | ICD-10-CM

## 2017-12-30 DIAGNOSIS — D62 Acute posthemorrhagic anemia: Secondary | ICD-10-CM | POA: Diagnosis present

## 2017-12-30 DIAGNOSIS — I132 Hypertensive heart and chronic kidney disease with heart failure and with stage 5 chronic kidney disease, or end stage renal disease: Secondary | ICD-10-CM | POA: Diagnosis present

## 2017-12-30 DIAGNOSIS — Z9841 Cataract extraction status, right eye: Secondary | ICD-10-CM

## 2017-12-30 DIAGNOSIS — I5032 Chronic diastolic (congestive) heart failure: Secondary | ICD-10-CM | POA: Diagnosis present

## 2017-12-30 DIAGNOSIS — J9600 Acute respiratory failure, unspecified whether with hypoxia or hypercapnia: Secondary | ICD-10-CM

## 2017-12-30 DIAGNOSIS — Z87891 Personal history of nicotine dependence: Secondary | ICD-10-CM

## 2017-12-30 DIAGNOSIS — J189 Pneumonia, unspecified organism: Secondary | ICD-10-CM | POA: Diagnosis present

## 2017-12-30 DIAGNOSIS — Z7952 Long term (current) use of systemic steroids: Secondary | ICD-10-CM

## 2017-12-30 DIAGNOSIS — Z7901 Long term (current) use of anticoagulants: Secondary | ICD-10-CM

## 2017-12-30 DIAGNOSIS — R778 Other specified abnormalities of plasma proteins: Secondary | ICD-10-CM

## 2017-12-30 DIAGNOSIS — Z8673 Personal history of transient ischemic attack (TIA), and cerebral infarction without residual deficits: Secondary | ICD-10-CM

## 2017-12-30 DIAGNOSIS — C9110 Chronic lymphocytic leukemia of B-cell type not having achieved remission: Secondary | ICD-10-CM | POA: Diagnosis present

## 2017-12-30 DIAGNOSIS — N2581 Secondary hyperparathyroidism of renal origin: Secondary | ICD-10-CM | POA: Diagnosis present

## 2017-12-30 DIAGNOSIS — E1122 Type 2 diabetes mellitus with diabetic chronic kidney disease: Secondary | ICD-10-CM | POA: Diagnosis present

## 2017-12-30 DIAGNOSIS — Z794 Long term (current) use of insulin: Secondary | ICD-10-CM

## 2017-12-30 DIAGNOSIS — Z9842 Cataract extraction status, left eye: Secondary | ICD-10-CM

## 2017-12-30 DIAGNOSIS — Z95828 Presence of other vascular implants and grafts: Secondary | ICD-10-CM

## 2017-12-30 DIAGNOSIS — Z8551 Personal history of malignant neoplasm of bladder: Secondary | ICD-10-CM

## 2017-12-30 DIAGNOSIS — Z9981 Dependence on supplemental oxygen: Secondary | ICD-10-CM

## 2017-12-30 DIAGNOSIS — N186 End stage renal disease: Secondary | ICD-10-CM | POA: Diagnosis present

## 2017-12-30 DIAGNOSIS — R0602 Shortness of breath: Secondary | ICD-10-CM

## 2017-12-30 DIAGNOSIS — R739 Hyperglycemia, unspecified: Secondary | ICD-10-CM

## 2017-12-30 DIAGNOSIS — J810 Acute pulmonary edema: Secondary | ICD-10-CM

## 2017-12-30 DIAGNOSIS — K219 Gastro-esophageal reflux disease without esophagitis: Secondary | ICD-10-CM | POA: Diagnosis present

## 2017-12-30 DIAGNOSIS — J9621 Acute and chronic respiratory failure with hypoxia: Secondary | ICD-10-CM | POA: Diagnosis present

## 2017-12-30 DIAGNOSIS — Z66 Do not resuscitate: Secondary | ICD-10-CM | POA: Diagnosis present

## 2017-12-30 DIAGNOSIS — E785 Hyperlipidemia, unspecified: Secondary | ICD-10-CM | POA: Diagnosis present

## 2017-12-30 DIAGNOSIS — D649 Anemia, unspecified: Secondary | ICD-10-CM

## 2017-12-30 DIAGNOSIS — J440 Chronic obstructive pulmonary disease with acute lower respiratory infection: Secondary | ICD-10-CM | POA: Diagnosis present

## 2017-12-30 DIAGNOSIS — E875 Hyperkalemia: Secondary | ICD-10-CM | POA: Diagnosis present

## 2017-12-30 DIAGNOSIS — K589 Irritable bowel syndrome without diarrhea: Secondary | ICD-10-CM | POA: Diagnosis present

## 2017-12-30 DIAGNOSIS — Z992 Dependence on renal dialysis: Secondary | ICD-10-CM

## 2017-12-30 DIAGNOSIS — J9811 Atelectasis: Secondary | ICD-10-CM | POA: Diagnosis present

## 2017-12-30 DIAGNOSIS — R252 Cramp and spasm: Secondary | ICD-10-CM | POA: Diagnosis not present

## 2017-12-30 NOTE — ED Provider Notes (Signed)
[facility] Emergency Department Provider Note   ____________________________________________   First MD Initiated Contact with Patient 12/30/17 2307     (approximate)  I have reviewed the triage vital signs and the nursing notes.   HISTORY  Chief Complaint Altered Mental Status    HPI Mark [name]. is a 72 y.o. male brought to the ED from home via EMS with a chief complaint of altered mentation.  Patient has a complicated medical history including ESRD on HD M/W/F, CLL, type 2 diabetes who has been in and out of the hospital all month.  Most recently admitted and discharged several days ago for new right lower extremity DVT.  He was placed on Eliquis for this.  Family states patient has been increasingly confused and agitated today.  At dinnertime he was combative.  They state patient gets like this when he has had a UTI in the past.  They also feel patient is weaker in his lower extremities bilaterally which is new for him.  Patient only complains of mild left sided headache.  Denies fever, chills, chest pain, shortness of breath, abdominal pain, nausea or vomiting.  Makes a tiny amount of urine daily.  Also states his dialysis access site has been intermittently oozing since Friday.  Daughter states patient has received recent blood transfusions.  He is on continuous oxygen at home with increasing oxygen requirement this weekend.   Past Medical History:  Diagnosis Date  . Anemia   . Arthritis   . Bladder cancer ([place])   . Chronic kidney disease   . Diabetes mellitus without complication ([place])   . Dialysis patient [facility])    M,W,F  . Dyspnea    DOE  . GERD (gastroesophageal reflux disease)   . HOH (hard of hearing)   . Hypertension   . IBS (irritable bowel syndrome)   . Lymphoma ([place]) 09/27/2014  . Neuropathy    RIGHT LEG  . Stroke [facility])    TIA    Patient Active Problem List   Diagnosis Date Noted  . Symptomatic anemia 12/31/2017  .  Hypoxia 12/25/2017  . Pressure injury of skin 12/16/2017  . Fluid overload 01/15/2017  . UTI (urinary tract infection) 10/20/2016  . Sepsis ([place]) 10/20/2016  . 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 Ulcer of foot, chronic ([place]) 03/21/2015  . History of bladder cancer 11/10/2014  . Penile bleeding 10/12/2014  . Dependence on renal dialysis ([place]) 08/18/2013  . Arterial blood pressure decreased 08/18/2013  . 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 Retina disorder 08/18/2013  . Breath shortness 08/18/2013  . End-stage renal disease on hemodialysis ([place]) 12/23/2012  . Absolute anemia 07/18/2012  . HPTH (hyperparathyroidism) ([place]) 07/18/2012  . Acidosis, metabolic 89/37/3428  . Hyperparathyroidism ([place]) 07/18/2012  . Abnormal presence of protein in urine 01/01/2012  . HLD (hyperlipidemia) 12/25/2011  . Diabetic retinopathy associated with type 2 diabetes mellitus ([place]) 10/26/1999  . Essential (primary) hypertension 04/04/1999  . Diabetes mellitus type 2, uncontrolled ([place]) 04/04/1999  . Type 2 diabetes mellitus with other diabetic kidney complication ([place]) 76/81/1572    Past Surgical History:  Procedure Laterality Date  . BACK SURGERY  2007  . CATARACT EXTRACTION W/PHACO Left 03/23/2016   Procedure: CATARACT EXTRACTION PHACO AND INTRAOCULAR LENS PLACEMENT (IOC);  Surgeon: Leandrew Koyanagi, MD;  Location: [facility] ORS;  Service: Ophthalmology;  Laterality: Left;  [place] 52.6 AP% 14.7 CDE 7.72 Fluid pack lot # [id number]  .  CATARACT EXTRACTION W/PHACO Right 05/30/2016   Procedure: CATARACT EXTRACTION PHACO AND INTRAOCULAR LENS PLACEMENT (IOC);  Surgeon: Leandrew Koyanagi, MD;  Location: [facility] ORS;  Service: Ophthalmology;  Laterality: Right;  [place] 01:08 AP% 13.9 CDE 9.53  note: could not get IV in patient, so procedure done without anesthesia personell present, ok per Dr Wallace Going fluid pack lo t# [id number]  . CIRCUMCISION     . PERIPHERAL VASCULAR CATHETERIZATION Left 08/19/2015   Procedure: A/V Shuntogram/Fistulagram;  Surgeon: Algernon Huxley, MD;  Location: [place] CV LAB;  Service: Cardiovascular;  Laterality: Left;  . PERIPHERAL VASCULAR CATHETERIZATION N/A 08/19/2015   Procedure: A/V Shunt Intervention;  Surgeon: Algernon Huxley, MD;  Location: [place] CV LAB;  Service: Cardiovascular;  Laterality: N/A;    Prior to Admission medications   Medication Sig Start Date End Date Taking? Authorizing Provider  albuterol (PROVENTIL) (2.5 MG/3ML) 0.083% nebulizer solution Take 2.5 mg by nebulization every 6 (six) hours as needed for wheezing or shortness of breath.   Yes [provider]  apixaban (ELIQUIS) 5 MG TABS tablet Take 2 tablets (10 mg total) by mouth 2 (two) times daily for 5 days. 12/27/17 01/01/18 Yes Gladstone Lighter, MD  budesonide-formoterol (SYMBICORT) 160-4.5 MCG/ACT inhaler Inhale 2 puffs into the lungs 2 (two) times daily. 12/27/17  Yes Gladstone Lighter, MD  Difluprednate (DUREZOL) 0.05 % EMUL Place 1 drop into the left eye 2 (two) times daily.    Yes [provider]  docusate sodium (COLACE) 100 MG capsule Take 100 mg by mouth daily as needed for mild constipation.   Yes [provider]  ENSURE PLUS (ENSURE PLUS) LIQD Take 1 Can by mouth daily.  01/26/14  Yes [provider]  fluticasone (FLONASE) 50 MCG/ACT nasal spray Place 1 spray into both nostrils daily as needed for allergies.  12/14/14  Yes [provider]  gabapentin (NEURONTIN) 100 MG capsule Take 1 capsule (100 mg total) by mouth 3 (three) times daily. 03/02/16  Yes Edrick Kins, DPM  glimepiride (AMARYL) 4 MG tablet Take 4 mg by mouth at bedtime. Prn glucose levels 10/06/14  Yes [provider]  LEVEMIR FLEXTOUCH 100 UNIT/ML Pen Inject 5-6 Units into the skin daily at 10 pm.  08/21/14  Yes [provider]  lovastatin (MEVACOR) 20 MG tablet Take 1 tablet by mouth daily. 12/25/16   Yes [provider]  metoCLOPramide (REGLAN) 5 MG tablet Take 5 mg by mouth 3 (three) times daily before meals. Patient takes 1 tab with each meal.   Yes [provider]  multivitamin (RENA-VIT) TABS tablet Take 1 tablet by mouth at bedtime.    Yes [provider]  nystatin cream (MYCOSTATIN) Apply 1 application topically 2 (two) times daily as needed (irritation).    Yes [provider]  Polyvinyl Alcohol-Povidone PF (REFRESH) 1.4-0.6 % SOLN Apply 1 drop to eye 3 (three) times daily as needed (Dry eyes).   Yes [provider]  predniSONE (DELTASONE) 10 MG tablet Take 4 tab PO daily x 1 day and taper off by 10 every day 12/27/17  Yes Gladstone Lighter, MD  silver sulfADIAZINE (SILVADENE) 1 % cream APPLY 1 APPLICATION TOPICALY DAILY Patient taking differently: Apply 1 application topically daily as needed (sores).  04/12/17  Yes Edrick Kins, DPM  torsemide (DEMADEX) 100 MG tablet Take 1 tablet (100 mg total) by mouth daily. Please take on Non dialysis days- Tues, Thurs, Saturday and sunday 12/28/17  Yes Gladstone Lighter, MD  apixaban [facility] [place])  5 MG TABS tablet Take 1 tablet (5 mg total) by mouth 2 (two) times daily. Patient not taking: Reported on 12/30/2017 01/01/18   Gladstone Lighter, MD    Allergies Heparin; Ibuprofen; Multivitamin [centrum]; Daypro [oxaprozin]; and Tape  Family History  Problem Relation Age of Onset  . Cancer Mother   . Kidney cancer Neg Hx   . Prostate cancer Neg Hx   . Kidney failure Neg Hx   . Bladder Cancer Neg Hx     Social History Social History   Tobacco Use  . Smoking status: Former Smoker    Types: Cigarettes    Last attempt to quit: 05/19/2013    Years since quitting: 4.6  . Smokeless tobacco: Never Used  . Tobacco comment: quit  Substance Use Topics  . Alcohol use: No    Alcohol/week: 0.0 standard drinks  . Drug use: No    Review of Systems  Constitutional: No fever/chills Eyes: No visual  changes. ENT: No sore throat. Cardiovascular: Denies chest pain. Respiratory: Positive for shortness of breath. Gastrointestinal: No abdominal pain.  No nausea, no vomiting.  No diarrhea.  No constipation. Genitourinary: Negative for dysuria. Musculoskeletal: Negative for back pain. Skin: Positive for waxing/waning oozing from dialysis access site.  Negative for rash. Neurological: Positive for headache.  Negative for focal weakness or numbness.   ____________________________________________   PHYSICAL EXAM:  VITAL SIGNS: ED Triage Vitals  Enc Vitals Group     BP 12/30/17 2248 (!) 155/60     Pulse Rate 12/30/17 2248 99     Resp --      Temp 12/30/17 2248 98.2 F (36.8 C)     Temp Source 12/30/17 2248 Oral     SpO2 12/30/17 2248 90 %     Weight 12/30/17 2251 165 lb (74.8 kg)     Height 12/30/17 2251 5\' 11"  (1.803 m)     Head Circumference --      Peak Flow --      Pain Score 12/30/17 2250 8     Pain Loc --      Pain Edu? --      Excl. in [place]? --     Constitutional: Alert and oriented. Well appearing and in mild acute distress. Eyes: Left subconjunctival hemorrhage which family states has been there for the past 2 weeks. PERRL. EOMI. Head: Atraumatic. Nose: No congestion/rhinnorhea. Mouth/Throat: Mucous membranes are moist.  Oropharynx non-erythematous. Neck: No stridor.   Cardiovascular: Normal rate, regular rhythm. Grossly normal heart sounds.  Good peripheral circulation. Respiratory: Normal respiratory effort.  No retractions. Lungs faint bibasilar rales. Gastrointestinal: Soft and nontender to light or deep palpation. No distention. No abdominal bruits. No CVA tenderness. Musculoskeletal:  Left arm fistula: Pinpoint access site currently not bleeding but when expressed has a pinpoint dot of blood to it. Right lower leg in brace. Neurologic: Alert and oriented x3.  CN II-XII grossly intact.  Normal speech and language. No gross focal neurologic deficits are  appreciated. MAEx4. Skin:  Skin is warm, dry and intact. No rash noted. Psychiatric: Mood and affect are irritable and agitated. Speech and behavior are normal.  ____________________________________________   LABS (all labs ordered are listed, but only abnormal results are displayed)  Labs Reviewed  CBC WITH DIFFERENTIAL/PLATELET - Abnormal; Notable for the following components:      Result Value   RBC 1.82 (*)    Hemoglobin 6.0 (*)    HCT 18.0 (*)    RDW 19.3 (*)    Platelets  141 (*)    Lymphs Abs 0.3 (*)    Monocytes Absolute 0.0 (*)    All other components within normal limits  COMPREHENSIVE METABOLIC PANEL - Abnormal; Notable for the following components:   Potassium 6.6 (*)    Chloride 95 (*)    Glucose, Bld 527 (*)    BUN 89 (*)    Creatinine, Ser 7.42 (*)    Calcium 6.7 (*)    Total Protein 5.6 (*)    GFR calc non Af Amer 6 (*)    GFR calc Af Amer 8 (*)    Anion gap 16 (*)    All other components within normal limits  TROPONIN I - Abnormal; Notable for the following components:   Troponin I 0.03 (*)    All other components within normal limits  CULTURE, BLOOD (ROUTINE X 2)  CULTURE, BLOOD (ROUTINE X 2)  URINE CULTURE  LACTIC ACID, PLASMA  URINALYSIS, COMPLETE (UACMP) WITH MICROSCOPIC  LACTIC ACID, PLASMA  PREPARE RBC (CROSSMATCH)   ____________________________________________  EKG  ED ECG REPORT I, SUNG,JADE J, the attending physician, personally viewed and interpreted this ECG.   Date: 12/31/2017  EKG Time: 2354  Rate: 89  Rhythm: normal EKG, normal sinus rhythm  Axis: RAD  Intervals:none  ST&T Change: Nonspecific  ____________________________________________  RADIOLOGY  ED MD interpretation: Pulmonary edema; no ICH  Official radiology report(s): Ct Head Wo Contrast  Result Date: 12/31/2017 CLINICAL DATA:  Confusion, combative. Headache. On Eliquis. History of lymphoma, end-stage renal disease on dialysis, hypertension, stroke. EXAM: CT HEAD  WITHOUT CONTRAST TECHNIQUE: Contiguous axial images were obtained from the base of the skull through the vertex without intravenous contrast. COMPARISON:  CT HEAD October 15, 2013 FINDINGS: BRAIN: No intraparenchymal hemorrhage, mass effect nor midline shift. The ventricles and sulci are normal for age. Minimal supratentorial white matter hypodensities less than expected for patient's age, though non-specific are most compatible with chronic small vessel ischemic disease. No acute large vascular territory infarcts. No abnormal extra-axial fluid collections. Basal cisterns are patent. VASCULAR: Moderate to severe calcific atherosclerosis of the carotid siphons. SKULL: No skull fracture. No significant scalp soft tissue swelling. SINUSES/ORBITS: Trace paranasal sinus mucosal thickening. Mastoid air cells are well aerated.The included ocular globes and orbital contents are non-suspicious. Status post bilateral ocular lens implants. OTHER: None. IMPRESSION: Normal noncontrast CT HEAD for age. Electronically Signed   By: Elon Alas M.D.   On: 12/31/2017 01:13   Dg Chest Port 1 View  Result Date: 12/30/2017 CLINICAL DATA:  Weakness. EXAM: PORTABLE CHEST 1 VIEW COMPARISON:  Most recent radiograph 12/24/2017, 12/18/2017. Additional priors. FINDINGS: Low lung volumes. Increased pulmonary edema from prior exam. Small right pleural effusion is grossly unchanged. Streaky left lung base atelectasis. Mild cardiomegaly is similar to priors. No pneumothorax. IMPRESSION: Increased pulmonary edema from prior exam. Small right pleural effusion is similar. Electronically Signed   By: Keith Rake M.D.   On: 12/30/2017 23:50    ____________________________________________   PROCEDURES  Procedure(s) performed: None  Procedures  Critical Care performed: Yes, see critical care note(s)   CRITICAL CARE Performed by: Paulette Blanch   Total critical care time: 45 minutes  Critical care time was exclusive of  separately billable procedures and treating other patients.  Critical care was necessary to treat or prevent imminent or life-threatening deterioration.  Critical care was time spent personally by me on the following activities: development of treatment plan with patient and/or surrogate as well as nursing, discussions with consultants, evaluation of  patient's response to treatment, examination of patient, obtaining history from patient or surrogate, ordering and performing treatments and interventions, ordering and review of laboratory studies, ordering and review of radiographic studies, pulse oximetry and re-evaluation of patient's condition.  ____________________________________________   INITIAL IMPRESSION / ASSESSMENT AND PLAN / ED COURSE  As part of my medical decision making, I reviewed the following data within the [place] History obtained from family, Nursing notes reviewed and incorporated, Labs reviewed, EKG interpreted, Old chart reviewed, Radiograph reviewed, Discussed with admitting physician and Notes from prior ED visits   72 year old male with renal failure on dialysis, CLL, type 2 diabetes who presents with altered mentation.  Now on Eliquis for recently diagnosed right lower extremity DVT. Differential diagnosis includes, but is not limited to, alcohol, illicit or prescription medications, or other toxic ingestion; intracranial pathology such as stroke or intracerebral hemorrhage; fever or infectious causes including sepsis; hypoxemia and/or hypercarbia; uremia; trauma; endocrine related disorders such as diabetes, hypoglycemia, and thyroid-related diseases; hypertensive encephalopathy; etc.  Patient refusing lab draw.  After discussion with patient and his family, he is agreeable to having IV team draw blood in place IV.  Will obtain CT head to evaluate for intracranial hemorrhage as patient is now on an anticoagulant.  Surgicel and Coban placed to oozing  site on fistula.  Patient has an increasing oxygen requirement with pulmonary edema on x-ray.  Anticipate hospitalization.  Clinical Course as of Jan 01 127  Mon Dec 31, 2017  0126 Updated patient and family of laboratory and imaging results.  Will transfuse 2 units PRBC with 1 mg IV Bumex in between units.  Hyperkalemia will be treated with IV calcium, sodium bicarbonate, dextrose, insulin and Kayexalate.  Have discussed with hospitalist to evaluate patient in the emergency department for admission.   [JS]    Clinical Course User Index [JS] Paulette Blanch, MD     ____________________________________________   FINAL CLINICAL IMPRESSION(S) / ED DIAGNOSES  Final diagnoses:  Altered mental status, unspecified altered mental status type  Symptomatic anemia  Hyperkalemia  ESRD (end stage renal disease) on dialysis ([place])  Hyperglycemia  Elevated troponin  Acute pulmonary edema [facility])     ED Discharge Orders    None       Note:  This document was prepared using Dragon voice recognition software and may include unintentional dictation errors.    Paulette Blanch, MD 12/31/17 [phone]

## 2017-12-30 NOTE — ED Notes (Signed)
ED Provider at bedside.  Pt explaining how multiple attempts at blood draw have been unsuccessful at previous visits an is now refusing blood work because of this  Family concerned that pt was delusional and agitated at home, c/o head and arm hurting at home, constipation at home, last BM today at 1630 "very good one",  Pt is dialysis M, W and F, last time Friday - pt reports on eliquis d/t recent blood clot in the leg  Hx of lukemia  Pt reports left eye red d/t straining on the commode

## 2017-12-30 NOTE — ED Notes (Signed)
Report given to Noel RN 

## 2017-12-30 NOTE — ED Triage Notes (Signed)
Per EMS, family called stating pt has been confused today and combative.  States pt gets like this when he has had an UTI in the past.  Also states pt has lower extremity weakness which is new for pt.    Pt has dialysis on M, W, F.   Had dialysis on Friday.  Seen in ED last week.  98% on 6L (normally on 2L)  Hx:  Leukemia

## 2017-12-31 ENCOUNTER — Encounter
Admission: EM | Disposition: A | Discharge status: Skilled Nursing Facility | Payer: Self-pay | Source: Home / Self Care | Attending: Internal Medicine

## 2017-12-31 ENCOUNTER — Encounter: Payer: Self-pay | Admitting: Internal Medicine

## 2017-12-31 ENCOUNTER — Emergency Department: Payer: Medicare HMO

## 2017-12-31 DIAGNOSIS — R4182 Altered mental status, unspecified: Secondary | ICD-10-CM | POA: Diagnosis not present

## 2017-12-31 DIAGNOSIS — E1122 Type 2 diabetes mellitus with diabetic chronic kidney disease: Secondary | ICD-10-CM | POA: Diagnosis present

## 2017-12-31 DIAGNOSIS — N186 End stage renal disease: Secondary | ICD-10-CM | POA: Diagnosis not present

## 2017-12-31 DIAGNOSIS — J440 Chronic obstructive pulmonary disease with acute lower respiratory infection: Secondary | ICD-10-CM | POA: Diagnosis present

## 2017-12-31 DIAGNOSIS — I5032 Chronic diastolic (congestive) heart failure: Secondary | ICD-10-CM | POA: Diagnosis present

## 2017-12-31 DIAGNOSIS — Z7189 Other specified counseling: Secondary | ICD-10-CM | POA: Diagnosis not present

## 2017-12-31 DIAGNOSIS — Z79899 Other long term (current) drug therapy: Secondary | ICD-10-CM | POA: Diagnosis not present

## 2017-12-31 DIAGNOSIS — E875 Hyperkalemia: Secondary | ICD-10-CM | POA: Diagnosis present

## 2017-12-31 DIAGNOSIS — D649 Anemia, unspecified: Secondary | ICD-10-CM

## 2017-12-31 DIAGNOSIS — Z992 Dependence on renal dialysis: Secondary | ICD-10-CM | POA: Diagnosis not present

## 2017-12-31 DIAGNOSIS — Z87891 Personal history of nicotine dependence: Secondary | ICD-10-CM

## 2017-12-31 DIAGNOSIS — Z9981 Dependence on supplemental oxygen: Secondary | ICD-10-CM | POA: Diagnosis not present

## 2017-12-31 DIAGNOSIS — Z515 Encounter for palliative care: Secondary | ICD-10-CM | POA: Diagnosis not present

## 2017-12-31 DIAGNOSIS — I82409 Acute embolism and thrombosis of unspecified deep veins of unspecified lower extremity: Secondary | ICD-10-CM

## 2017-12-31 DIAGNOSIS — N2581 Secondary hyperparathyroidism of renal origin: Secondary | ICD-10-CM | POA: Diagnosis present

## 2017-12-31 DIAGNOSIS — C9110 Chronic lymphocytic leukemia of B-cell type not having achieved remission: Secondary | ICD-10-CM

## 2017-12-31 DIAGNOSIS — D631 Anemia in chronic kidney disease: Secondary | ICD-10-CM

## 2017-12-31 DIAGNOSIS — I132 Hypertensive heart and chronic kidney disease with heart failure and with stage 5 chronic kidney disease, or end stage renal disease: Secondary | ICD-10-CM | POA: Diagnosis present

## 2017-12-31 DIAGNOSIS — K589 Irritable bowel syndrome without diarrhea: Secondary | ICD-10-CM | POA: Diagnosis present

## 2017-12-31 DIAGNOSIS — J9811 Atelectasis: Secondary | ICD-10-CM | POA: Diagnosis present

## 2017-12-31 DIAGNOSIS — Z66 Do not resuscitate: Secondary | ICD-10-CM | POA: Diagnosis present

## 2017-12-31 DIAGNOSIS — Z961 Presence of intraocular lens: Secondary | ICD-10-CM | POA: Diagnosis present

## 2017-12-31 DIAGNOSIS — D62 Acute posthemorrhagic anemia: Secondary | ICD-10-CM | POA: Diagnosis present

## 2017-12-31 DIAGNOSIS — I82401 Acute embolism and thrombosis of unspecified deep veins of right lower extremity: Secondary | ICD-10-CM | POA: Diagnosis present

## 2017-12-31 DIAGNOSIS — R0602 Shortness of breath: Secondary | ICD-10-CM | POA: Diagnosis not present

## 2017-12-31 DIAGNOSIS — J189 Pneumonia, unspecified organism: Secondary | ICD-10-CM | POA: Diagnosis present

## 2017-12-31 DIAGNOSIS — Z8551 Personal history of malignant neoplasm of bladder: Secondary | ICD-10-CM | POA: Diagnosis not present

## 2017-12-31 DIAGNOSIS — J9621 Acute and chronic respiratory failure with hypoxia: Secondary | ICD-10-CM | POA: Diagnosis not present

## 2017-12-31 DIAGNOSIS — K219 Gastro-esophageal reflux disease without esophagitis: Secondary | ICD-10-CM | POA: Diagnosis present

## 2017-12-31 DIAGNOSIS — E1110 Type 2 diabetes mellitus with ketoacidosis without coma: Secondary | ICD-10-CM | POA: Diagnosis present

## 2017-12-31 DIAGNOSIS — E785 Hyperlipidemia, unspecified: Secondary | ICD-10-CM | POA: Diagnosis present

## 2017-12-31 HISTORY — PX: IVC FILTER INSERTION: CATH118245

## 2017-12-31 LAB — URINALYSIS, COMPLETE (UACMP) WITH MICROSCOPIC
Bilirubin Urine: NEGATIVE
GLUCOSE, UA: 50 mg/dL — AB
KETONES UR: NEGATIVE mg/dL
LEUKOCYTES UA: NEGATIVE
Nitrite: NEGATIVE
PH: 5 (ref 5.0–8.0)
Protein, ur: 100 mg/dL — AB
SPECIFIC GRAVITY, URINE: 1.014 (ref 1.005–1.030)
SQUAMOUS EPITHELIAL / LPF: NONE SEEN (ref 0–5)

## 2017-12-31 LAB — CBC WITH DIFFERENTIAL/PLATELET
BASOS ABS: 0 10*3/uL (ref 0–0.1)
Basophils Relative: 0 %
EOS ABS: 0 10*3/uL (ref 0–0.7)
EOS PCT: 0 %
HCT: 18 % — ABNORMAL LOW (ref 40.0–52.0)
Hemoglobin: 6 g/dL — ABNORMAL LOW (ref 13.0–18.0)
LYMPHS ABS: 0.3 10*3/uL — AB (ref 1.0–3.6)
Lymphocytes Relative: 5 %
MCH: 32.6 pg (ref 26.0–34.0)
MCHC: 33 g/dL (ref 32.0–36.0)
MCV: 98.8 fL (ref 80.0–100.0)
MONO ABS: 0 10*3/uL — AB (ref 0.2–1.0)
Monocytes Relative: 1 %
Neutro Abs: 5.9 10*3/uL (ref 1.4–6.5)
Neutrophils Relative %: 94 %
Platelets: 141 10*3/uL — ABNORMAL LOW (ref 150–440)
RBC: 1.82 MIL/uL — AB (ref 4.40–5.90)
RDW: 19.3 % — AB (ref 11.5–14.5)
WBC: 6.2 10*3/uL (ref 3.8–10.6)

## 2017-12-31 LAB — COMPREHENSIVE METABOLIC PANEL
ALBUMIN: 3.5 g/dL (ref 3.5–5.0)
ALK PHOS: 76 U/L (ref 38–126)
ALT: 24 U/L (ref 0–44)
ANION GAP: 16 — AB (ref 5–15)
AST: 20 U/L (ref 15–41)
BILIRUBIN TOTAL: 1 mg/dL (ref 0.3–1.2)
BUN: 89 mg/dL — AB (ref 8–23)
CALCIUM: 6.7 mg/dL — AB (ref 8.9–10.3)
CO2: 24 mmol/L (ref 22–32)
CREATININE: 7.42 mg/dL — AB (ref 0.61–1.24)
Chloride: 95 mmol/L — ABNORMAL LOW (ref 98–111)
GFR calc Af Amer: 8 mL/min — ABNORMAL LOW (ref >60)
GFR calc non Af Amer: 6 mL/min — ABNORMAL LOW (ref >60)
GLUCOSE: 527 mg/dL — AB (ref 70–99)
Potassium: 6.6 mmol/L (ref 3.5–5.1)
SODIUM: 135 mmol/L (ref 135–145)
Total Protein: 5.6 g/dL — ABNORMAL LOW (ref 6.5–8.1)

## 2017-12-31 LAB — PREPARE RBC (CROSSMATCH)

## 2017-12-31 LAB — TROPONIN I: TROPONIN I: 0.03 ng/mL — AB (ref <0.03)

## 2017-12-31 LAB — GLUCOSE, CAPILLARY
GLUCOSE-CAPILLARY: 182 mg/dL — AB (ref 70–99)
Glucose-Capillary: 437 mg/dL — ABNORMAL HIGH (ref 70–99)
Glucose-Capillary: 389 mg/dL — ABNORMAL HIGH (ref 70–99)
Glucose-Capillary: 166 mg/dL — ABNORMAL HIGH (ref 70–99)
Glucose-Capillary: 284 mg/dL — ABNORMAL HIGH (ref 70–99)

## 2017-12-31 LAB — PHOSPHORUS
Phosphorus: 4.7 mg/dL — ABNORMAL HIGH (ref 2.5–4.6)
Phosphorus: 4.4 mg/dL (ref 2.5–4.6)

## 2017-12-31 LAB — PREALBUMIN: Prealbumin: 24.3 mg/dL (ref 18–38)

## 2017-12-31 LAB — MRSA PCR SCREENING: MRSA BY PCR: NEGATIVE

## 2017-12-31 LAB — LACTIC ACID, PLASMA: Lactic Acid, Venous: 1.8 mmol/L (ref 0.5–1.9)

## 2017-12-31 LAB — HEMOGLOBIN: HEMOGLOBIN: 7.4 g/dL — AB (ref 13.0–18.0)

## 2017-12-31 LAB — MAGNESIUM: Magnesium: 2.7 mg/dL — ABNORMAL HIGH (ref 1.7–2.4)

## 2017-12-31 SURGERY — IVC FILTER INSERTION
Anesthesia: Moderate Sedation

## 2017-12-31 MED ORDER — MIDAZOLAM HCL 2 MG/2ML IJ SOLN
INTRAMUSCULAR | Status: DC | PRN
  Administered 2017-12-31: 2 mg via INTRAVENOUS

## 2017-12-31 MED ORDER — SODIUM CHLORIDE 0.9 % IV SOLN: 10.0000 mL/h | Freq: Once | INTRAVENOUS | Status: DC

## 2017-12-31 MED ORDER — PREDNISONE 20 MG PO TABS: 40.0000 mg | ORAL_TABLET | Freq: Every day | ORAL | Status: DC

## 2017-12-31 MED ORDER — FENTANYL CITRATE (PF) 100 MCG/2ML IJ SOLN
INTRAMUSCULAR | Status: AC
  Filled 2017-12-31: qty 2

## 2017-12-31 MED ORDER — DEXTROSE 50 % IV SOLN: 25.0000 mL | INTRAVENOUS | Status: DC | PRN

## 2017-12-31 MED ORDER — INSULIN GLARGINE 100 UNIT/ML ~~LOC~~ SOLN
6.0000 [IU] | Freq: Every day | SUBCUTANEOUS | Status: DC
  Filled 2017-12-31: qty 0.06

## 2017-12-31 MED ORDER — ONDANSETRON HCL 4 MG PO TABS: 4.0000 mg | ORAL_TABLET | Freq: Four times a day (QID) | ORAL | Status: DC | PRN

## 2017-12-31 MED ORDER — POLYVINYL ALCOHOL 1.4 % OP SOLN
1.0000 [drp] | Freq: Four times a day (QID) | OPHTHALMIC | Status: DC | PRN
  Filled 2017-12-31: qty 15

## 2017-12-31 MED ORDER — MOMETASONE FURO-FORMOTEROL FUM 200-5 MCG/ACT IN AERO
2.0000 | INHALATION_SPRAY | Freq: Two times a day (BID) | RESPIRATORY_TRACT | Status: DC
  Administered 2018-01-01 – 2018-01-05 (×4): 2 via RESPIRATORY_TRACT
  Filled 2017-12-31 (×2): qty 8.8

## 2017-12-31 MED ORDER — INSULIN ASPART 100 UNIT/ML ~~LOC~~ SOLN: 0.0000 [IU] | Freq: Every day | SUBCUTANEOUS | Status: DC

## 2017-12-31 MED ORDER — ENSURE ENLIVE PO LIQD
237.0000 mL | Freq: Every day | ORAL | Status: DC
  Administered 2018-01-01 – 2018-01-08 (×6): 237 mL via ORAL

## 2017-12-31 MED ORDER — INSULIN ASPART 100 UNIT/ML ~~LOC~~ SOLN
10.0000 [IU] | Freq: Once | SUBCUTANEOUS | Status: AC
  Administered 2017-12-31: 10 [IU] via INTRAVENOUS
  Filled 2017-12-31: qty 1

## 2017-12-31 MED ORDER — BISACODYL 5 MG PO TBEC
5.0000 mg | DELAYED_RELEASE_TABLET | Freq: Every day | ORAL | Status: DC | PRN
  Administered 2018-01-03: 5 mg via ORAL
  Filled 2017-12-31: qty 1

## 2017-12-31 MED ORDER — INSULIN DETEMIR 100 UNIT/ML ~~LOC~~ SOLN
10.0000 [IU] | Freq: Every day | SUBCUTANEOUS | Status: DC
  Administered 2017-12-31 – 2018-01-05 (×6): 10 [IU] via SUBCUTANEOUS
  Filled 2017-12-31 (×7): qty 0.1

## 2017-12-31 MED ORDER — ACETAMINOPHEN 325 MG PO TABS: 650.0000 mg | ORAL_TABLET | Freq: Four times a day (QID) | ORAL | Status: DC | PRN

## 2017-12-31 MED ORDER — ALBUTEROL SULFATE (2.5 MG/3ML) 0.083% IN NEBU
2.5000 mg | INHALATION_SOLUTION | Freq: Four times a day (QID) | RESPIRATORY_TRACT | Status: DC | PRN
  Administered 2018-01-04: 2.5 mg via RESPIRATORY_TRACT
  Filled 2017-12-31 (×2): qty 3

## 2017-12-31 MED ORDER — TORSEMIDE 20 MG PO TABS: 100.0000 mg | ORAL_TABLET | Freq: Every day | ORAL | Status: DC

## 2017-12-31 MED ORDER — IPRATROPIUM-ALBUTEROL 0.5-2.5 (3) MG/3ML IN SOLN
3.0000 mL | Freq: Four times a day (QID) | RESPIRATORY_TRACT | Status: DC
  Administered 2017-12-31 – 2018-01-01 (×4): 3 mL via RESPIRATORY_TRACT
  Filled 2017-12-31 (×5): qty 3

## 2017-12-31 MED ORDER — CHLORHEXIDINE GLUCONATE CLOTH 2 % EX PADS
6.0000 | MEDICATED_PAD | Freq: Every day | CUTANEOUS | Status: DC
  Administered 2018-01-04: 6 via TOPICAL

## 2017-12-31 MED ORDER — METHYLPREDNISOLONE SODIUM SUCC 125 MG IJ SOLR
125.0000 mg | Freq: Once | INTRAMUSCULAR | Status: AC
  Administered 2017-12-31: 125 mg via INTRAVENOUS
  Filled 2017-12-31: qty 2

## 2017-12-31 MED ORDER — TORSEMIDE 20 MG PO TABS
100.0000 mg | ORAL_TABLET | ORAL | Status: DC
  Administered 2018-01-01 – 2018-01-08 (×4): 100 mg via ORAL
  Filled 2017-12-31: qty 1
  Filled 2017-12-31 (×2): qty 5
  Filled 2017-12-31: qty 1

## 2017-12-31 MED ORDER — GABAPENTIN 100 MG PO CAPS
100.0000 mg | ORAL_CAPSULE | Freq: Three times a day (TID) | ORAL | Status: DC
  Administered 2017-12-31 – 2018-01-07 (×17): 100 mg via ORAL
  Filled 2017-12-31 (×20): qty 1

## 2017-12-31 MED ORDER — METHYLPREDNISOLONE SODIUM SUCC 125 MG IJ SOLR: 60.0000 mg | Freq: Two times a day (BID) | INTRAMUSCULAR | Status: DC

## 2017-12-31 MED ORDER — METOCLOPRAMIDE HCL 5 MG PO TABS
5.0000 mg | ORAL_TABLET | Freq: Three times a day (TID) | ORAL | Status: DC
  Administered 2017-12-31 – 2018-01-08 (×21): 5 mg via ORAL
  Filled 2017-12-31 (×29): qty 1

## 2017-12-31 MED ORDER — INSULIN REGULAR BOLUS VIA INFUSION
0.0000 [IU] | Freq: Three times a day (TID) | INTRAVENOUS | Status: DC
  Filled 2017-12-31: qty 10

## 2017-12-31 MED ORDER — MIDAZOLAM HCL 5 MG/5ML IJ SOLN
INTRAMUSCULAR | Status: AC
  Filled 2017-12-31: qty 5

## 2017-12-31 MED ORDER — RENA-VITE PO TABS
1.0000 | ORAL_TABLET | Freq: Every day | ORAL | Status: DC
  Administered 2017-12-31 – 2018-01-07 (×8): 1 via ORAL
  Filled 2017-12-31 (×10): qty 1

## 2017-12-31 MED ORDER — DEXTROSE 50 % IV SOLN
12.5000 g | Freq: Once | INTRAVENOUS | Status: AC
  Administered 2017-12-31: 12.5 g via INTRAVENOUS
  Filled 2017-12-31: qty 50

## 2017-12-31 MED ORDER — SENNOSIDES-DOCUSATE SODIUM 8.6-50 MG PO TABS
1.0000 | ORAL_TABLET | Freq: Every evening | ORAL | Status: DC | PRN
  Administered 2018-01-05: 1 via ORAL
  Filled 2017-12-31 (×2): qty 1

## 2017-12-31 MED ORDER — FAMOTIDINE IN NACL 20-0.9 MG/50ML-% IV SOLN
20.0000 mg | INTRAVENOUS | Status: DC
  Administered 2018-01-01 – 2018-01-08 (×8): 20 mg via INTRAVENOUS
  Filled 2017-12-31 (×8): qty 50

## 2017-12-31 MED ORDER — CEFAZOLIN SODIUM-DEXTROSE 1-4 GM/50ML-% IV SOLN
1.0000 g | Freq: Once | INTRAVENOUS | Status: AC
  Administered 2017-12-31: 1 g via INTRAVENOUS
  Filled 2017-12-31: qty 50

## 2017-12-31 MED ORDER — HEPARIN (PORCINE) IN NACL 1000-0.9 UT/500ML-% IV SOLN
INTRAVENOUS | Status: AC
  Filled 2017-12-31: qty 500

## 2017-12-31 MED ORDER — LIDOCAINE HCL (PF) 1 % IJ SOLN
5.0000 mL | INTRAMUSCULAR | Status: DC | PRN
  Filled 2017-12-31: qty 5

## 2017-12-31 MED ORDER — BUMETANIDE 0.25 MG/ML IJ SOLN
1.0000 mg | Freq: Once | INTRAMUSCULAR | Status: DC
  Filled 2017-12-31: qty 4

## 2017-12-31 MED ORDER — SODIUM CHLORIDE 0.9 % IV SOLN
INTRAVENOUS | Status: DC
  Administered 2017-12-31: 14:00:00 via INTRAVENOUS

## 2017-12-31 MED ORDER — ALTEPLASE 2 MG IJ SOLR
2.0000 mg | Freq: Once | INTRAMUSCULAR | Status: DC | PRN
  Filled 2017-12-31: qty 2

## 2017-12-31 MED ORDER — IOPAMIDOL (ISOVUE-300) INJECTION 61%
INTRAVENOUS | Status: DC | PRN
  Administered 2017-12-31: 15 mL via INTRA_ARTERIAL

## 2017-12-31 MED ORDER — IPRATROPIUM-ALBUTEROL 0.5-2.5 (3) MG/3ML IN SOLN
3.0000 mL | RESPIRATORY_TRACT | Status: DC | PRN
  Administered 2018-01-03: 3 mL via RESPIRATORY_TRACT
  Filled 2017-12-31: qty 3

## 2017-12-31 MED ORDER — SODIUM CHLORIDE 0.9 % IV SOLN
1.0000 g | Freq: Once | INTRAVENOUS | Status: AC
  Administered 2017-12-31: 1 g via INTRAVENOUS
  Filled 2017-12-31: qty 10

## 2017-12-31 MED ORDER — SODIUM CHLORIDE 0.9 % IV SOLN: INTRAVENOUS | Status: DC

## 2017-12-31 MED ORDER — PRAVASTATIN SODIUM 20 MG PO TABS
20.0000 mg | ORAL_TABLET | Freq: Every day | ORAL | Status: DC
  Administered 2017-12-31 – 2018-01-07 (×8): 20 mg via ORAL
  Filled 2017-12-31 (×8): qty 1

## 2017-12-31 MED ORDER — SODIUM POLYSTYRENE SULFONATE 15 GM/60ML PO SUSP
30.0000 g | Freq: Once | ORAL | Status: AC
  Administered 2017-12-31: 30 g via ORAL
  Filled 2017-12-31: qty 120

## 2017-12-31 MED ORDER — SODIUM CHLORIDE 0.9 % IV SOLN: 100.0000 mL | INTRAVENOUS | Status: DC | PRN

## 2017-12-31 MED ORDER — APIXABAN 5 MG PO TABS
5.0000 mg | ORAL_TABLET | Freq: Two times a day (BID) | ORAL | Status: DC
  Administered 2017-12-31: 5 mg via ORAL
  Filled 2017-12-31: qty 1

## 2017-12-31 MED ORDER — PENTAFLUOROPROP-TETRAFLUOROETH EX AERO
1.0000 "application " | INHALATION_SPRAY | CUTANEOUS | Status: DC | PRN
  Filled 2017-12-31: qty 30

## 2017-12-31 MED ORDER — ONDANSETRON HCL 4 MG/2ML IJ SOLN: 4.0000 mg | Freq: Four times a day (QID) | INTRAMUSCULAR | Status: DC | PRN

## 2017-12-31 MED ORDER — LIDOCAINE-PRILOCAINE 2.5-2.5 % EX CREA
1.0000 "application " | TOPICAL_CREAM | CUTANEOUS | Status: DC | PRN
  Filled 2017-12-31: qty 5

## 2017-12-31 MED ORDER — INSULIN ASPART 100 UNIT/ML ~~LOC~~ SOLN
0.0000 [IU] | Freq: Three times a day (TID) | SUBCUTANEOUS | Status: DC
  Administered 2017-12-31 (×2): 3 [IU] via SUBCUTANEOUS
  Administered 2017-12-31: 15 [IU] via SUBCUTANEOUS
  Administered 2018-01-01 (×2): 5 [IU] via SUBCUTANEOUS
  Administered 2018-01-01 – 2018-01-02 (×2): 2 [IU] via SUBCUTANEOUS
  Administered 2018-01-03 (×2): 5 [IU] via SUBCUTANEOUS
  Administered 2018-01-03: 3 [IU] via SUBCUTANEOUS
  Administered 2018-01-04: 2 [IU] via SUBCUTANEOUS
  Administered 2018-01-04 (×2): 3 [IU] via SUBCUTANEOUS
  Administered 2018-01-05: 8 [IU] via SUBCUTANEOUS
  Administered 2018-01-05: 15 [IU] via SUBCUTANEOUS
  Administered 2018-01-06: 09:00:00 8 [IU] via SUBCUTANEOUS
  Administered 2018-01-06 (×2): 15 [IU] via SUBCUTANEOUS
  Administered 2018-01-07: 08:00:00 4 [IU] via SUBCUTANEOUS
  Administered 2018-01-07: 17:00:00 3 [IU] via SUBCUTANEOUS
  Administered 2018-01-08: 17:00:00 11 [IU] via SUBCUTANEOUS
  Administered 2018-01-08: 08:00:00 15 [IU] via SUBCUTANEOUS
  Filled 2017-12-31 (×23): qty 1

## 2017-12-31 MED ORDER — HYPROMELLOSE (GONIOSCOPIC) 2.5 % OP SOLN: 1.0000 [drp] | Freq: Four times a day (QID) | OPHTHALMIC | Status: DC | PRN

## 2017-12-31 MED ORDER — SODIUM CHLORIDE 0.9 % IV SOLN
INTRAVENOUS | Status: DC
  Filled 2017-12-31: qty 1

## 2017-12-31 MED ORDER — LIDOCAINE-EPINEPHRINE (PF) 1 %-1:200000 IJ SOLN
INTRAMUSCULAR | Status: AC
  Filled 2017-12-31: qty 30

## 2017-12-31 MED ORDER — SODIUM BICARBONATE 8.4 % IV SOLN
50.0000 meq | Freq: Once | INTRAVENOUS | Status: AC
  Administered 2017-12-31: 50 meq via INTRAVENOUS
  Filled 2017-12-31: qty 50

## 2017-12-31 MED ORDER — DIFLUPREDNATE 0.05 % OP EMUL
1.0000 [drp] | Freq: Two times a day (BID) | OPHTHALMIC | Status: DC
  Administered 2017-12-31 – 2018-01-08 (×17): 1 [drp] via OPHTHALMIC
  Filled 2017-12-31: qty 5

## 2017-12-31 MED ORDER — INSULIN ASPART 100 UNIT/ML ~~LOC~~ SOLN: 0.0000 [IU] | Freq: Three times a day (TID) | SUBCUTANEOUS | Status: DC

## 2017-12-31 MED ORDER — ORAL CARE MOUTH RINSE
15.0000 mL | Freq: Two times a day (BID) | OROMUCOSAL | Status: DC
  Administered 2017-12-31 – 2018-01-06 (×11): 15 mL via OROMUCOSAL

## 2017-12-31 MED ORDER — ACETAMINOPHEN 650 MG RE SUPP: 650.0000 mg | Freq: Four times a day (QID) | RECTAL | Status: DC | PRN

## 2017-12-31 SURGICAL SUPPLY — 3 items
FILTER VC CELECT-FEMORAL (Filter) ×3 IMPLANT
PACK ANGIOGRAPHY (CUSTOM PROCEDURE TRAY) ×3 IMPLANT
WIRE J 3MM .035X145CM (WIRE) ×3 IMPLANT

## 2017-12-31 NOTE — Consult Note (Addendum)
Name: Mark [name]. MRN: [id number] DOB: 03-13-1946    LOS: 0  Referring Provider: Dr. Jodell Cipro Reason for Referral: Hypoglycemia  PULMONARY / CRITICAL CARE MEDICINE  HPI: 72 year old African-American male with a medical history as indicated below who presented to the ED via EMS with altered mental status and hyperglycemia.  History is obtained from ED records, patient and EMS records.  Per ED records, family initially felt that patient may have a UTI as patient was confused, and weak.  Family also indicated that he has been delusional and agitated at times.  At the ED, patient's labs showed hyperglycemia with a blood glucose level of 527 mg/dL, potassium of 6.6 and a hemoglobin of 6.0 down from 9.1 on 12/24/17. His CXR showed pulmonary edema and a small right pleural effusion.   He is being admitted to the ICU for management of hyperosmolar hyperglycemic state and acute blood loss anemia likely due to anticoagulation.   Per ED records, patient has had previous blood transfusions.  It is unclear if his low hemoglobin at this point is due to his chronic anemia due to acute acute blood loss. Of note, patient has been in and out of the hospital multiple times at [facility].  He was recently diagnosed with right lower extremity DVT and started on Eliquis.  He goes for dialysis Monday Wednesday and Friday and had his treatment on Friday.  Patient makes very minimal urine.  He is on oxygen at home and reports compliance.   Past Medical History:  Diagnosis Date  . Anemia   . Arthritis   . Bladder cancer ([place])   . Chronic kidney disease   . Diabetes mellitus without complication ([place])   . Dialysis patient [facility])    M,W,F  . Dyspnea    DOE  . GERD (gastroesophageal reflux disease)   . HOH (hard of hearing)   . Hypertension   . IBS (irritable bowel syndrome)   . Lymphoma ([place]) 09/27/2014  . Neuropathy    RIGHT LEG  . Stroke [facility])    TIA   Past Surgical History:  Procedure Laterality Date  .  BACK SURGERY  2007  . CATARACT EXTRACTION W/PHACO Left 03/23/2016   Procedure: CATARACT EXTRACTION PHACO AND INTRAOCULAR LENS PLACEMENT (IOC);  Surgeon: Leandrew Koyanagi, MD;  Location: [facility] ORS;  Service: Ophthalmology;  Laterality: Left;  [place] 52.6 AP% 14.7 CDE 7.72 Fluid pack lot # [id number]  . CATARACT EXTRACTION W/PHACO Right 05/30/2016   Procedure: CATARACT EXTRACTION PHACO AND INTRAOCULAR LENS PLACEMENT ([place]);  Surgeon: Leandrew Koyanagi, MD;  Location: [facility] ORS;  Service: Ophthalmology;  Laterality: Right;  [place] 01:08 AP% 13.9 CDE 9.53  note: could not get IV in patient, so procedure done without anesthesia personell present, ok per Dr Wallace Going fluid pack lo t# [id number]  . CIRCUMCISION    . PERIPHERAL VASCULAR CATHETERIZATION Left 08/19/2015   Procedure: A/V Shuntogram/Fistulagram;  Surgeon: Algernon Huxley, MD;  Location: [place] CV LAB;  Service: Cardiovascular;  Laterality: Left;  . PERIPHERAL VASCULAR CATHETERIZATION N/A 08/19/2015   Procedure: A/V Shunt Intervention;  Surgeon: Algernon Huxley, MD;  Location: [place] CV LAB;  Service: Cardiovascular;  Laterality: N/A;   Prior to Admission medications   Medication Sig Start Date End Date Taking? Authorizing Provider  albuterol (PROVENTIL) (2.5 MG/3ML) 0.083% nebulizer solution Take 2.5 mg by nebulization every 6 (six) hours as needed for wheezing or shortness of breath.   Yes [provider]  apixaban (ELIQUIS) 5 MG TABS tablet Take  2 tablets (10 mg total) by mouth 2 (two) times daily for 5 days. 12/27/17 01/01/18 Yes Gladstone Lighter, MD  budesonide-formoterol (SYMBICORT) 160-4.5 MCG/ACT inhaler Inhale 2 puffs into the lungs 2 (two) times daily. 12/27/17  Yes Gladstone Lighter, MD  Difluprednate (DUREZOL) 0.05 % EMUL Place 1 drop into the left eye 2 (two) times daily.    Yes [provider]  docusate sodium (COLACE) 100 MG capsule Take 100 mg by mouth daily as needed for mild constipation.   Yes [provider]  ENSURE PLUS (ENSURE PLUS) LIQD Take 1 Can by mouth daily.  01/26/14  Yes [provider]  fluticasone (FLONASE) 50 MCG/ACT nasal spray Place 1 spray into both nostrils daily as needed for allergies.  12/14/14  Yes [provider]  gabapentin (NEURONTIN) 100 MG capsule Take 1 capsule (100 mg total) by mouth 3 (three) times daily. 03/02/16  Yes Edrick Kins, DPM  glimepiride (AMARYL) 4 MG tablet Take 4 mg by mouth at bedtime. Prn glucose levels 10/06/14  Yes [provider]  LEVEMIR FLEXTOUCH 100 UNIT/ML Pen Inject 5-6 Units into the skin daily at 10 pm.  08/21/14  Yes [provider]  lovastatin (MEVACOR) 20 MG tablet Take 1 tablet by mouth daily. 12/25/16  Yes [provider]  metoCLOPramide (REGLAN) 5 MG tablet Take 5 mg by mouth 3 (three) times daily before meals. Patient takes 1 tab with each meal.   Yes [provider]  multivitamin (RENA-VIT) TABS tablet Take 1 tablet by mouth at bedtime.    Yes [provider]  nystatin cream (MYCOSTATIN) Apply 1 application topically 2 (two) times daily as needed (irritation).    Yes [provider]  Polyvinyl Alcohol-Povidone PF (REFRESH) 1.4-0.6 % SOLN Apply 1 drop to eye 3 (three) times daily as needed (Dry eyes).   Yes [provider]  predniSONE (DELTASONE) 10 MG tablet Take 4 tab PO daily x 1 day and taper off by 10 every day 12/27/17  Yes Gladstone Lighter, MD  silver sulfADIAZINE (SILVADENE) 1 % cream APPLY 1 APPLICATION TOPICALY DAILY Patient taking differently: Apply 1 application topically daily as needed (sores).  04/12/17  Yes Edrick Kins, DPM  torsemide (DEMADEX) 100 MG tablet Take 1 tablet (100 mg total) by mouth daily. Please take on Non dialysis days- Tues, Thurs, Saturday and sunday 12/28/17  Yes Gladstone Lighter, MD  apixaban (ELIQUIS) 5 MG TABS tablet Take 1 tablet (5 mg total) by mouth 2 (two) times daily. Patient not taking: Reported on  12/30/2017 01/01/18   Gladstone Lighter, MD   Allergies Allergies  Allergen Reactions  . Heparin Nausea Only  . Ibuprofen Other (See Comments)    DUE TO DIALYSIS  . Multivitamin [Centrum] Other (See Comments)    DUE TO DIALYSIS  . Daypro [Oxaprozin] Swelling and Rash    Other reaction(s): Other (See Comments)  . Tape Rash    Family History Family History  Problem Relation Age of Onset  . Cancer Mother   . Kidney cancer Neg Hx   . Prostate cancer Neg Hx   . Kidney failure Neg Hx   . Bladder Cancer Neg Hx    Social History  reports that he quit smoking about 4 years ago. His smoking use included cigarettes. He has never used smokeless tobacco. He reports that he does not drink alcohol or use drugs.  Review Of Systems:  Review of Systems  Constitutional: Negative for chills, fever and weight loss.  HENT:  Negative.   Eyes: Negative.   Respiratory: Positive for shortness of breath. Negative for cough and hemoptysis.   Cardiovascular: Negative for chest pain, palpitations, orthopnea and leg swelling.  Gastrointestinal: Negative.   Genitourinary: Negative for dysuria and urgency.  Musculoskeletal: Negative.        Generalized weakness  Skin: Negative.   Neurological: Negative.   Endo/Heme/Allergies: Negative.   Psychiatric/Behavioral: Negative.     Vital Signs: Temp:  [98.2 F (36.8 C)-98.7 F (37.1 C)] 98.4 F (36.9 C) (09/30 0545) Pulse Rate:  [72-99] 74 (09/30 0630) Resp:  [14-27] 15 (09/30 0630) BP: (105-189)/(46-98) 117/58 (09/30 0600) SpO2:  [82 %-100 %] 96 % (09/30 0630) Weight:  [74.8 kg] 74.8 kg (09/29 2251)  Physical Examination: General: Chronically ill looking, in no acute distress Neuro: Alert to person and place, follows basic commands, moves all extremities HEENT: PERRLA, trachea midline, mild JVD Cardiovascular: Apical pulse regular, S1-S2, no murmur gallop, +2 pulses bilaterally, +1 edema Lungs: Bilateral breath sounds, diminished in the  bases Abdomen: Nondistended, normal bowel sounds in all 4 quadrants Musculoskeletal: No joint deformities, positive range of motion Skin: Warm and dry  LABS:  BMET Recent Labs  Lab 12/24/17 2122 12/31/17 0027  NA 133* 135  K 4.7 6.6*  CL 93* 95*  CO2 29 24  BUN 51* 89*  CREATININE 4.13* 7.42*  GLUCOSE 428* 527*    Electrolytes Recent Labs  Lab 12/24/17 2122 12/31/17 0027 12/31/17 0436  CALCIUM 7.8* 6.7*  --   MG  --   --  2.7*  PHOS  --   --  4.7*    CBC Recent Labs  Lab 12/24/17 2122 12/31/17 0027  WBC 6.1 6.2  HGB 9.1* 6.0*  HCT 27.4* 18.0*  PLT 159 141*    Coag's No results for input(s): APTT, INR in the last 168 hours.  Sepsis Markers Recent Labs  Lab 12/31/17 0027  LATICACIDVEN 1.8    ABG No results for input(s): PHART, PCO2ART, PO2ART in the last 168 hours.  Liver Enzymes Recent Labs  Lab 12/24/17 2122 12/31/17 0027  AST 17 20  ALT 23 24  ALKPHOS 82 76  BILITOT 1.1 1.0  ALBUMIN 3.8 3.5    Cardiac Enzymes Recent Labs  Lab 12/24/17 2122 12/31/17 0027  TROPONINI 0.04* 0.03*    Glucose Recent Labs  Lab 12/26/17 2304 12/27/17 0555 12/27/17 0747 12/27/17 0958 12/27/17 1200 12/31/17 0430  GLUCAP 269* 215* 183* 212* 252* 437*    Imaging Ct Head Wo Contrast  Result Date: 12/31/2017 CLINICAL DATA:  Confusion, combative. Headache. On Eliquis. History of lymphoma, end-stage renal disease on dialysis, hypertension, stroke. EXAM: CT HEAD WITHOUT CONTRAST TECHNIQUE: Contiguous axial images were obtained from the base of the skull through the vertex without intravenous contrast. COMPARISON:  CT HEAD October 15, 2013 FINDINGS: BRAIN: No intraparenchymal hemorrhage, mass effect nor midline shift. The ventricles and sulci are normal for age. Minimal supratentorial white matter hypodensities less than expected for patient's age, though non-specific are most compatible with chronic small vessel ischemic disease. No acute large vascular  territory infarcts. No abnormal extra-axial fluid collections. Basal cisterns are patent. VASCULAR: Moderate to severe calcific atherosclerosis of the carotid siphons. SKULL: No skull fracture. No significant scalp soft tissue swelling. SINUSES/ORBITS: Trace paranasal sinus mucosal thickening. Mastoid air cells are well aerated.The included ocular globes and orbital contents are non-suspicious. Status post bilateral ocular lens implants. OTHER: None. IMPRESSION: Normal noncontrast CT HEAD for age. Electronically Signed   By: Sandie Ano  Bloomer M.D.   On: 12/31/2017 01:13   Dg Chest Port 1 View  Result Date: 12/30/2017 CLINICAL DATA:  Weakness. EXAM: PORTABLE CHEST 1 VIEW COMPARISON:  Most recent radiograph 12/24/2017, 12/18/2017. Additional priors. FINDINGS: Low lung volumes. Increased pulmonary edema from prior exam. Small right pleural effusion is grossly unchanged. Streaky left lung base atelectasis. Mild cardiomegaly is similar to priors. No pneumothorax. IMPRESSION: Increased pulmonary edema from prior exam. Small right pleural effusion is similar. Electronically Signed   By: Keith Rake M.D.   On: 12/30/2017 23:50    STUDIES:  Last echocardiogram was 12/08/2017  Normal left ventricular systolic function, ejection fraction > 24%  Diastolic dysfunction - grade II (elevated filling pressures)  Dilated left atrium - mild  Normal right ventricular systolic function  CULTURES: Blood cultures x2  ANTIBIOTICS: None  LINES/TUBES: Peripheral IVs  DISCUSSION: This is a 72 year old African-American male presenting with acute encephalopathy secondary to hyperglycemia and acute anemia, pulmonary edema and hyperkalemia.   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 Hyperkalemia Rt pleural effusion Pulmonary edema End-stage renal failure on hemodialysis Hyperosmolar hyperglycemic state Elevated  troponin Acute pulmonary edema Uncontrolled type 2 diabetes mellitus GERD Hypertension History of bladder cancer, and lymphoma  PLAN IV insulin protocol and transition to subcu Insulin as tolerated Lantus IV fluids Transfuse per guidelines Trend hemoglobin and hematocrit Fecal occult blood x2 Hemodialysis per nephrology We will hold Eliquis for now in light of anemia until GI blood loss is excluded   12/31/2017, 6:42 AM  BEST PRACTICE / DISPOSITION Level of Care: SDU Code Status:  Full code Diet: Heart healthy and carb modified DVT Px: SCDs GI Px: Pepcid  Magdalene S. [facility] ANP-BC Pulmonary and Critical Care Medicine [facility] Pager [phone] or [phone]  NB: This document was prepared using Dragon voice recognition software and may include unintentional dictation errors.  PCCM ATTENDING ATTESTATION:  I have evaluated patient with the APP, I personally  reviewed database in its entirety and discussed care plan in detail. In addition, this patient was discussed on multidisciplinary rounds.   I agree with assessment and plan.  Important exam findings: Lethargy +anemia +DKA    Major problems addressed by PCCM team: Acute encephalopathy from DKA,Anemia  Additional recommendations include- PLAN/REC: IV insulin Blood transfusions as needed ICU monitoring Address goals of care      Mark [name], M.D.  [facility] Pulmonary & Critical Care Medicine  Medical Director [place] Director [place] Department        12/31/2017, 6:41 AM

## 2017-12-31 NOTE — Progress Notes (Signed)
Post HD assessment. Pt tolerated tx well without c/o or complication. Net UF 2004, goal met.    12/31/17 1316  Vital Signs  Temp 98.1 F (36.7 C)  Temp Source Oral  Pulse Rate 76  Pulse Rate Source Monitor  Resp (!) 29  BP 128/81  BP Location Right Leg  BP Method Automatic  Patient Position (if appropriate) Lying  Oxygen Therapy  SpO2 95 %  O2 Device Nasal Cannula  O2 Flow Rate (L/min) 4 L/min  Dialysis Weight  Weight 88.5 kg  Type of Weight Post-Dialysis  Post-Hemodialysis Assessment  Rinseback Volume (mL) 250 mL  KECN 79.4 V  Dialyzer Clearance Lightly streaked  Duration of HD Treatment -hour(s) 3.5 hour(s)  Hemodialysis Intake (mL) 500 mL  UF Total -Machine (mL) 2504 mL  Net UF (mL) 2004 mL  Tolerated HD Treatment Yes  AVG/AVF Arterial Site Held (minutes) 10 minutes  AVG/AVF Venous Site Held (minutes) 10 minutes  Education / Care Plan  Dialysis Education Provided Yes  Documented Education in Care Plan Yes  Fistula / Graft Left Forearm Arteriovenous fistula  No Placement Date or Time found.   Placed prior to admission: No  Orientation: Left  Access Location: Forearm  Access Type: Arteriovenous fistula  Site Condition No complications  Fistula / Graft Assessment Present;Thrill;Bruit  Status Deaccessed  Drainage Description None

## 2017-12-31 NOTE — Progress Notes (Addendum)
Pt refused lab draw from lab tech. Pt educated by RN about need for a repeat potassium level. Pt stated that his blood "only gets drawn in dialysis". Pt educated that lab did not get done in dialysis due to needing a sample after dialysis was complete. Pt started yelling at RN stating "You aren't gonna stick me. You can pull it out this damn IV. I won't let them stick me so just tell them to go the hell away". Pt educated that that kind of speech and behavior is not acceptable. Pt stopped yelling and stated "no you cannot stick me". Pt educated that was fine. MD paged via amion. Awaiting response.   526pm Spoke to Dr Leslye Peer. MD reports pt has a history of refusing labs. No further orders.

## 2017-12-31 NOTE — ED Notes (Signed)
Pressure bandage removed from left forearm, bleeding abated, surgicel still in place, site bandaged

## 2017-12-31 NOTE — ED Notes (Signed)
Pt c/o I can't breathe -- NRB applied

## 2017-12-31 NOTE — Progress Notes (Signed)
Patient ID: Mark Skeeter., male   DOB: [birth year], 72 y.o.   MRN: [id number]  [facility] PROGRESS NOTE  Mark [name]. NIO:[id number] DOB: Jun 11, 1945 DOA: 12/30/2017 PCP: Albina Billet, MD  HPI/Subjective: Patient feels weak.  States his breathing is a little bit better.  He was recently started on Eliquis and his hemoglobin was found to be down at 6.0.  He does not see any signs of bleeding but he does have a history of bladder cancer.  Objective: Vitals:   12/31/17 1316 12/31/17 1317  BP: 128/81   Pulse: 76 76  Resp: (!) 29 (!) 21  Temp: 98.1 F (36.7 C)   SpO2: 95% 95%    Filed Weights   12/30/17 2251 12/31/17 0915 12/31/17 1316  Weight: 74.8 kg 91.6 kg 88.5 kg    ROS: Review of Systems  Constitutional: Positive for malaise/fatigue. Negative for chills and fever.  Eyes: Negative for blurred vision.  Respiratory: Positive for cough and shortness of breath.   Cardiovascular: Negative for chest pain.  Gastrointestinal: Negative for abdominal pain, constipation, diarrhea, nausea and vomiting.  Genitourinary: Negative for dysuria.  Musculoskeletal: Negative for joint pain.  Neurological: Negative for dizziness and headaches.   Exam: Physical Exam  Constitutional: He is oriented to person, place, and time.  HENT:  Nose: No mucosal edema.  Mouth/Throat: No oropharyngeal exudate or posterior oropharyngeal edema.  Eyes: Pupils are equal, round, and reactive to light. Conjunctivae, EOM and lids are normal.  Neck: No JVD present. Carotid bruit is not present. No edema present. No thyroid mass and no thyromegaly present.  Cardiovascular: S1 normal and S2 normal. Exam reveals no gallop.  No murmur heard. Pulses:      Dorsalis pedis pulses are 2+ on the right side, and 2+ on the left side.  Respiratory: No respiratory distress. He has decreased breath sounds in the right lower field and the left lower field. He has no wheezes. He has no rhonchi. He has no rales.   GI: Soft. Bowel sounds are normal. There is no tenderness.  Musculoskeletal:       Right ankle: He exhibits no swelling.       Left ankle: He exhibits no swelling.  Lymphadenopathy:    He has no cervical adenopathy.  Neurological: He is alert and oriented to person, place, and time. No cranial nerve deficit.  Skin: Skin is warm. No rash noted. Nails show no clubbing.  Psychiatric: He has a normal mood and affect.      Data Reviewed: Basic Metabolic Panel: Recent Labs  Lab 12/24/17 2122 12/31/17 0027 12/31/17 0436 12/31/17 0946  NA 133* 135  --   --   K 4.7 6.6*  --   --   CL 93* 95*  --   --   CO2 29 24  --   --   GLUCOSE 428* 527*  --   --   BUN 51* 89*  --   --   CREATININE 4.13* 7.42*  --   --   CALCIUM 7.8* 6.7*  --   --   MG  --   --  2.7*  --   PHOS  --   --  4.7* 4.4   Liver Function Tests: Recent Labs  Lab 12/24/17 2122 12/31/17 0027  AST 17 20  ALT 23 24  ALKPHOS 82 76  BILITOT 1.1 1.0  PROT 6.4* 5.6*  ALBUMIN 3.8 3.5   CBC: Recent Labs  Lab 12/24/17 2122 12/31/17  0867 12/31/17 1155  WBC 6.1 6.2  --   NEUTROABS 5.5 5.9  --   HGB 9.1* 6.0* 7.4*  HCT 27.4* 18.0*  --   MCV 96.3 98.8  --   PLT 159 141*  --    Cardiac Enzymes: Recent Labs  Lab 12/24/17 2122 12/31/17 0027  TROPONINI 0.04* 0.03*    CBG: Recent Labs  Lab 12/27/17 0958 12/27/17 1200 12/31/17 0430 12/31/17 0751 12/31/17 1233  GLUCAP 212* 252* 437* 389* 182*    Recent Results (from the past 240 hour(s))  MRSA PCR Screening     Status: None   Collection Time: 12/25/17  9:58 AM  Result Value Ref Range Status   MRSA by PCR NEGATIVE NEGATIVE Final    Comment:        The GeneXpert MRSA Assay (FDA approved for NASAL specimens only), is one component of a comprehensive MRSA colonization surveillance program. It is not intended to diagnose MRSA infection nor to guide or monitor treatment for MRSA infections. Performed at [facility], [address]  [place], [place] [address]   MRSA PCR Screening     Status: None   Collection Time: 12/31/17  4:43 AM  Result Value Ref Range Status   MRSA by PCR NEGATIVE NEGATIVE Final    Comment:        The GeneXpert MRSA Assay (FDA approved for NASAL specimens only), is one component of a comprehensive MRSA colonization surveillance program. It is not intended to diagnose MRSA infection nor to guide or monitor treatment for MRSA infections. Performed at [facility], [address] [place], [place] [address]      Studies: Ct Head Wo Contrast  Result Date: 12/31/2017 CLINICAL DATA:  Confusion, combative. Headache. On Eliquis. History of lymphoma, end-stage renal disease on dialysis, hypertension, stroke. EXAM: CT HEAD WITHOUT CONTRAST TECHNIQUE: Contiguous axial images were obtained from the base of the skull through the vertex without intravenous contrast. COMPARISON:  CT HEAD October 15, 2013 FINDINGS: BRAIN: No intraparenchymal hemorrhage, mass effect nor midline shift. The ventricles and sulci are normal for age. Minimal supratentorial white matter hypodensities less than expected for patient's age, though non-specific are most compatible with chronic small vessel ischemic disease. No acute large vascular territory infarcts. No abnormal extra-axial fluid collections. Basal cisterns are patent. VASCULAR: Moderate to severe calcific atherosclerosis of the carotid siphons. SKULL: No skull fracture. No significant scalp soft tissue swelling. SINUSES/ORBITS: Trace paranasal sinus mucosal thickening. Mastoid air cells are well aerated.The included ocular globes and orbital contents are non-suspicious. Status post bilateral ocular lens implants. OTHER: None. IMPRESSION: Normal noncontrast CT HEAD for age. Electronically Signed   By: Elon Alas M.D.   On: 12/31/2017 01:13   Dg Chest Port 1 View  Result Date: 12/30/2017 CLINICAL DATA:  Weakness. EXAM: PORTABLE CHEST 1 VIEW COMPARISON:  Most recent  radiograph 12/24/2017, 12/18/2017. Additional priors. FINDINGS: Low lung volumes. Increased pulmonary edema from prior exam. Small right pleural effusion is grossly unchanged. Streaky left lung base atelectasis. Mild cardiomegaly is similar to priors. No pneumothorax. IMPRESSION: Increased pulmonary edema from prior exam. Small right pleural effusion is similar. Electronically Signed   By: Keith Rake M.D.   On: 12/30/2017 23:50    Scheduled Meds: . bumetanide  1 mg Intravenous Once  . Chlorhexidine Gluconate Cloth  6 each Topical Q0600  . Difluprednate  1 drop Left Eye BID  . feeding supplement (ENSURE ENLIVE)  237 mL Oral Daily  . gabapentin  100  mg Oral TID  . insulin aspart  0-15 Units Subcutaneous TID WC  . insulin detemir  10 Units Subcutaneous Daily  . ipratropium-albuterol  3 mL Nebulization Q6H  . mouth rinse  15 mL Mouth Rinse BID  . metoCLOPramide  5 mg Oral TID AC  . mometasone-formoterol  2 puff Inhalation BID  . multivitamin  1 tablet Oral QHS  . pravastatin  20 mg Oral q1800  . [START ON 01/01/2018] torsemide  100 mg Oral Once per day on Sun Tue Thu Sat   Continuous Infusions: . sodium chloride    . sodium chloride    . sodium chloride    . sodium chloride    . famotidine (PEPCID) IV    . insulin (NOVOLIN-R) infusion Stopped (12/31/17 0806)    Assessment/Plan:  1. Acute on chronic anemia.  Patient was given 1 unit of packed red blood cells on a hemoglobin of 6.0.  Hemoglobin did respond but likely getting a second unit of blood.  The last time I saw him in the hospital I need to give him 3 units of blood also. 2. Hyperkalemia.  Urgent dialysis needed to remove potassium. 3. Recent diagnosis of DVT with transfusion dependent anemia.  Eliquis on hold.  Vascular surgery consultation. 4. Acute pulmonary edema and acute on chronic respiratory failure. Asked nursing staff to try to bring down his oxygen to 3 to 4 L rather than 6 L.  Breathing a little bit better during  dialysis. 5. Type 2 diabetes mellitus on detemir insulin and sliding scale 6. History of bladder cancer and CLL. 7. Hyperlipidemia unspecified on pravastatin 8. End-stage renal disease on dialysis 9. History of stroke.  Unable to give anticoagulation currently. 10. DNR already ordered  Code Status:     Code Status Orders  (From admission, onward)         Start     Ordered   12/31/17 0441  Do not attempt resuscitation (DNR)  Continuous    Question Answer Comment  In the event of cardiac or respiratory ARREST Do not call a "code blue"   In the event of cardiac or respiratory ARREST Do not perform Intubation, CPR, defibrillation or ACLS   In the event of cardiac or respiratory ARREST Use medication by any route, position, wound care, and other measures to relive pain and suffering. May use oxygen, suction and manual treatment of airway obstruction as needed for comfort.      12/31/17 0440        Code Status History    Date Active Date Inactive Code Status Order ID Comments User Context   12/31/2017 0212 12/31/2017 0440 DNR [id number]  Arta Silence, MD ED   12/25/2017 0514 12/27/2017 1651 Partial Code [id number]  Harrie Foreman, MD ED   12/18/2017 1441 12/23/2017 1907 Partial Code [id number]  Asencion Gowda, NP Inpatient   01/15/2017 0423 01/18/2017 2120 Full Code [id number]  Saundra Shelling, MD Inpatient   10/20/2016 1627 10/22/2016 1817 Full Code [id number]  Lance Coon, MD Inpatient      Disposition Plan: Patient now interested in rehab  Consultants:  Nephrology  Vascular surgery  Oncology  Time spent: 35 minutes  [place]

## 2017-12-31 NOTE — Progress Notes (Signed)
Please note patient is followed by outpatient Palliative at home. [place] made aware.  Flo Shanks RN, BSN, [place], hospital liaison [phone]

## 2017-12-31 NOTE — Progress Notes (Signed)
HD tx start    12/31/17 0929  Vital Signs  Pulse Rate 78  Pulse Rate Source Monitor  Resp (!) 21  BP 129/60  BP Location Right Leg  BP Method Automatic  Patient Position (if appropriate) Lying  Oxygen Therapy  SpO2 92 %  O2 Device Nasal Cannula  O2 Flow Rate (L/min) 6 L/min  During Hemodialysis Assessment  Blood Flow Rate (mL/min) 400 mL/min  Arterial Pressure (mmHg) -140 mmHg  Venous Pressure (mmHg) 160 mmHg  Transmembrane Pressure (mmHg) 70 mmHg  Ultrafiltration Rate (mL/min) 710 mL/min  Dialysate Flow Rate (mL/min) 500 ml/min  Conductivity: Machine  13.5  HD Safety Checks Performed Yes  Dialysis Fluid Bolus Normal Saline  Bolus Amount (mL) 250 mL  Intra-Hemodialysis Comments Tx initiated  Fistula / Graft Left Forearm Arteriovenous fistula  No Placement Date or Time found.   Placed prior to admission: No  Orientation: Left  Access Location: Forearm  Access Type: Arteriovenous fistula  Status Accessed  Needle Size 15

## 2017-12-31 NOTE — Progress Notes (Addendum)
Pt transferred from cath lab. Pt arrived alert and oriented x4. Pt asking for his belongings, eyedrops, and a nepro. Pt educated that RN will call ICU and let them know we need his belongings. Pt spent 5 minutes yelling at this RN that his belongings are supposed to be with him and he wants his phone, eyedrops and other belongings. Pt also informed RN that he was tired of people "losing his shit" and he was gonna get "hot and I'm gonna get hyperventilated and you need to figure it out". Pt asked not to yell at RN and other staff. Pt assured that this RN would speak with ICU RN and find out where his belongings are. Pt verbalizes understanding.   [place] from ICU brought pt's belongings. Pt verified in presence of both RN's that all his belongings including his eyedrops were present.

## 2017-12-31 NOTE — H&P (Signed)
[facility] History & Physical Update  The patient was interviewed and re-examined.  The patient's previous History and Physical has been reviewed and is unchanged.  There is no change in the plan of care. We plan to proceed with the scheduled procedure.  Leotis Pain, MD  12/31/2017, 2:56 PM

## 2017-12-31 NOTE — Op Note (Signed)
[facility]   OPERATIVE NOTE    PRE-OPERATIVE DIAGNOSIS: DVT, severe anemia requiring cessation of anticoagulation  POST-OPERATIVE DIAGNOSIS: same as above  PROCEDURE: 1.   Ultrasound guidance for vascular access to the right femoral vein 2.   Catheter placement into the inferior vena cava 3.   Inferior venacavogram 4.   Placement of a cook Celect IVC filter  SURGEON: Leotis Pain, MD  ASSISTANT(S): None  ANESTHESIA: local with 2 mg of Versed  ESTIMATED BLOOD LOSS: minimal  CONTRAST: 15 cc  FLUORO TIME: less than one minute  FINDING(S): 1.  Patent IVC  SPECIMEN(S):  none  INDICATIONS:   Mark [name]. is a 72 y.o. male who presents with a recent diagnosis of DVT and having been started on Eliquis about a week ago.  He developed severe anemia and the anticoagulation needs to be stopped.  Inferior vena cava filter is indicated for this reason.  Risks and benefits including filter thrombosis, migration, fracture, bleeding, and infection were all discussed.  We discussed that all IVC filters that we place can be removed if desired from the patient once the need for the filter has passed.    DESCRIPTION: After obtaining full informed written consent, the patient was brought back to the vascular suite. The skin was sterilely prepped and draped in a sterile surgical field was created. Moderate conscious sedation was administered during a face to face encounter with the patient throughout the procedure with my supervision of the RN administering medicines and monitoring the patient's vital signs, pulse oximetry, telemetry and mental status throughout from the start of the procedure until the patient was taken to the recovery room. The right femoral vein was accessed under direct ultrasound guidance without difficulty with a Seldinger needle and a J-wire was then placed. After skin nick and dilatation, the delivery sheath was placed into the inferior vena cava  and an inferior venacavogram was performed. This demonstrated a patent IVC with the level of the renal veins at L1.  The filter was then deployed into the inferior vena cava at the level of the L1-L2 interspace just below the renal veins. The delivery sheath was then removed. Pressure was held. Sterile dressings were placed. The patient tolerated the procedure well and was taken to the recovery room in stable condition.  COMPLICATIONS: None  CONDITION: Stable  Leotis Pain  12/31/2017, 3:33 PM   This note was created with Dragon Medical transcription system. Any errors in dictation are purely unintentional.

## 2017-12-31 NOTE — ED Notes (Signed)
Call for report - RN receiving another pt att, will call when available

## 2017-12-31 NOTE — ED Notes (Signed)
CRITICAL LAB: TROPONIN is 0.03, GLUCOSE is 527, POTASSIUM is 6.6, [place], Lab, Dr. Beather Arbour notified, orders received

## 2017-12-31 NOTE — ED Notes (Signed)
ICU called for report RN receiving another pt att

## 2017-12-31 NOTE — Consult Note (Signed)
[facility] Vascular Consult Note  MRN : [id number]  Shaheed Schmuck [name]. is a 72 y.o. ([birth year]) male who presents with chief complaint of  Chief Complaint  Patient presents with  . Altered Mental Status  .  History of Present Illness: I am asked to see the patient by Dr. Leslye Peer.  He was apparently recently diagnosed with a DVT and started on Eliquis.  He is admitted with confusion and profound anemia with hemoglobin of 6.  As such, his anticoagulation has to be stopped.  With a recently diagnosed filter and a high risk of embolization without anticoagulation, we are consulted for IVC filter placement.  Patient currently has no chest pain or shortness of breath.  He is not really walking much and is having a lot of medical issues currently.  No current fevers or chills.  He is having complaints of constipation and says they gave him something for that earlier but he has not yet had a bowel movement today.  He has not had any obvious signs of bleeding per rectum or bladder but his hemoglobin is dropped significantly.  Current Facility-Administered Medications  Medication Dose Route Frequency Provider Last Rate Last Dose  . 0.9 %  sodium chloride infusion  10 mL/hr Intravenous Once Sridharan, Prasanna, MD      . 0.9 %  sodium chloride infusion   Intravenous Continuous Tukov-Yual, Magdalene S, NP      . 0.9 %  sodium chloride infusion  100 mL Intravenous PRN Lateef, Munsoor, MD      . 0.9 %  sodium chloride infusion  100 mL Intravenous PRN Lateef, Munsoor, MD      . acetaminophen (TYLENOL) tablet 650 mg  650 mg Oral Q6H PRN Arta Silence, MD       Or  . acetaminophen (TYLENOL) suppository 650 mg  650 mg Rectal Q6H PRN Jodell Cipro, Prasanna, MD      . albuterol (PROVENTIL) (2.5 MG/3ML) 0.083% nebulizer solution 2.5 mg  2.5 mg Nebulization Q6H PRN Jodell Cipro, Prasanna, MD      . alteplase (CATHFLO ACTIVASE) injection 2 mg  2 mg Intracatheter Once PRN Lateef, Munsoor,  MD      . bisacodyl (DULCOLAX) EC tablet 5 mg  5 mg Oral Daily PRN Arta Silence, MD      . bumetanide (BUMEX) injection 1 mg  1 mg Intravenous Once Arta Silence, MD      . Chlorhexidine Gluconate Cloth 2 % PADS 6 each  6 each Topical Q0600 Lateef, Munsoor, MD      . dextrose 50 % solution 25 mL  25 mL Intravenous PRN Tukov-Yual, Arlyss Gandy, NP      . Difluprednate 0.05 % EMUL 1 drop  1 drop Left Eye BID Arta Silence, MD      . famotidine (PEPCID) IVPB 20 mg premix  20 mg Intravenous Q24H Tukov-Yual, Magdalene S, NP      . feeding supplement (ENSURE ENLIVE) (ENSURE ENLIVE) liquid 237 mL  237 mL Oral Daily Arta Silence, MD      . gabapentin (NEURONTIN) capsule 100 mg  100 mg Oral TID Arta Silence, MD      . insulin aspart (novoLOG) injection 0-15 Units  0-15 Units Subcutaneous TID WC Flora Lipps, MD   3 Units at 12/31/17 1238  . insulin detemir (LEVEMIR) injection 10 Units  10 Units Subcutaneous Daily Flora Lipps, MD   10 Units at 12/31/17 1219  . insulin regular (NOVOLIN R,HUMULIN R) 100 Units in  sodium chloride 0.9 % 100 mL (1 Units/mL) infusion   Intravenous Continuous Tukov-Yual, Arlyss Gandy, NP   Stopped at 12/31/17 0806  . ipratropium-albuterol (DUONEB) 0.5-2.5 (3) MG/3ML nebulizer solution 3 mL  3 mL Nebulization Q6H Arta Silence, MD   3 mL at 12/31/17 1358  . ipratropium-albuterol (DUONEB) 0.5-2.5 (3) MG/3ML nebulizer solution 3 mL  3 mL Nebulization Q4H PRN Sridharan, Prasanna, MD      . lidocaine (PF) (XYLOCAINE) 1 % injection 5 mL  5 mL Intradermal PRN Lateef, Munsoor, MD      . lidocaine-prilocaine (EMLA) cream 1 application  1 application Topical PRN Lateef, Munsoor, MD      . MEDLINE mouth rinse  15 mL Mouth Rinse BID Tukov-Yual, Magdalene S, NP      . metoCLOPramide (REGLAN) tablet 5 mg  5 mg Oral TID AC Sridharan, Prasanna, MD      . mometasone-formoterol (DULERA) 200-5 MCG/ACT inhaler 2 puff  2 puff Inhalation BID Arta Silence, MD       . multivitamin (RENA-VIT) tablet 1 tablet  1 tablet Oral QHS Arta Silence, MD      . ondansetron (ZOFRAN) tablet 4 mg  4 mg Oral Q6H PRN Arta Silence, MD       Or  . ondansetron (ZOFRAN) injection 4 mg  4 mg Intravenous Q6H PRN Arta Silence, MD      . pentafluoroprop-tetrafluoroeth (GEBAUERS) aerosol 1 application  1 application Topical PRN Lateef, Munsoor, MD      . polyvinyl alcohol (LIQUIFILM TEARS) 1.4 % ophthalmic solution 1 drop  1 drop Both Eyes QID PRN Arta Silence, MD      . pravastatin (PRAVACHOL) tablet 20 mg  20 mg Oral q1800 Sridharan, Prasanna, MD      . senna-docusate (Senokot-S) tablet 1 tablet  1 tablet Oral QHS PRN Arta Silence, MD      . Derrill Memo ON 01/01/2018] torsemide (DEMADEX) tablet 100 mg  100 mg Oral Once per day on Sun Tue Thu Sat Arta Silence, MD        Past Medical History:  Diagnosis Date  . Anemia   . Arthritis   . Bladder cancer ([place])   . Chronic kidney disease   . Diabetes mellitus without complication ([place])   . Dialysis patient [facility])    M,W,F  . Dyspnea    DOE  . GERD (gastroesophageal reflux disease)   . HOH (hard of hearing)   . Hypertension   . IBS (irritable bowel syndrome)   . Lymphoma ([place]) 09/27/2014  . Neuropathy    RIGHT LEG  . Stroke [facility])    TIA    Past Surgical History:  Procedure Laterality Date  . BACK SURGERY  2007  . CATARACT EXTRACTION W/PHACO Left 03/23/2016   Procedure: CATARACT EXTRACTION PHACO AND INTRAOCULAR LENS PLACEMENT (IOC);  Surgeon: Leandrew Koyanagi, MD;  Location: [facility] ORS;  Service: Ophthalmology;  Laterality: Left;  [place] 52.6 AP% 14.7 CDE 7.72 Fluid pack lot # [id number]  . CATARACT EXTRACTION W/PHACO Right 05/30/2016   Procedure: CATARACT EXTRACTION PHACO AND INTRAOCULAR LENS PLACEMENT ([place]);  Surgeon: Leandrew Koyanagi, MD;  Location: [facility] ORS;  Service: Ophthalmology;  Laterality: Right;  [place] 01:08 AP% 13.9 CDE 9.53  note: could not get IV in patient, so  procedure done without anesthesia personell present, ok per Dr Wallace Going fluid pack lo t# [id number]  . CIRCUMCISION    . PERIPHERAL VASCULAR CATHETERIZATION Left 08/19/2015   Procedure: A/V Shuntogram/Fistulagram;  Surgeon: Algernon Huxley, MD;  Location:  [place] [facility];  Service: Cardiovascular;  Laterality: Left;  . PERIPHERAL VASCULAR CATHETERIZATION N/A 08/19/2015   Procedure: A/V Shunt Intervention;  Surgeon: Algernon Huxley, MD;  Location: [place] [facility];  Service: Cardiovascular;  Laterality: N/A;    Social History Social History   Tobacco Use  . Smoking status: Former Smoker    Types: Cigarettes    Last attempt to quit: 05/19/2013    Years since quitting: 4.6  . Smokeless tobacco: Never Used  . Tobacco comment: quit  Substance Use Topics  . Alcohol use: No    Alcohol/week: 0.0 standard drinks  . Drug use: No    Family History Family History  Problem Relation Age of Onset  . Cancer Mother   . Kidney cancer Neg Hx   . Prostate cancer Neg Hx   . Kidney failure Neg Hx   . Bladder Cancer Neg Hx   Son who is also on dialysis No bleeding or clotting disorders  Allergies  Allergen Reactions  . Heparin Nausea Only  . Ibuprofen Other (See Comments)    DUE TO DIALYSIS  . Multivitamin [Centrum] Other (See Comments)    DUE TO DIALYSIS  . Daypro [Oxaprozin] Swelling and Rash    Other reaction(s): Other (See Comments)  . Tape Rash     REVIEW OF SYSTEMS (Negative unless checked)  Constitutional: [] Weight loss  [] Fever  [] Chills Cardiac: [] Chest pain   [] Chest pressure   [] Palpitations   [] Shortness of breath when laying flat   [] Shortness of breath at rest   [] Shortness of breath with exertion. Vascular:  [] Pain in legs with walking   [] Pain in legs at rest   [] Pain in legs when laying flat   [] Claudication   [] Pain in feet when walking  [] Pain in feet at rest  [] Pain in feet when laying flat   [] History of DVT   [] Phlebitis   [] Swelling in legs   [] Varicose veins    [] Non-healing ulcers Pulmonary:   [] Uses home oxygen   [] Productive cough   [] Hemoptysis   [] Wheeze  [] COPD   [] Asthma Neurologic:  [] Dizziness  [] Blackouts   [] Seizures   [x] History of stroke   [] History of TIA  [] Aphasia   [] Temporary blindness   [] Dysphagia   [] Weakness or numbness in arms   [] Weakness or numbness in legs Musculoskeletal:  [x] Arthritis   [] Joint swelling   [] Joint pain   [] Low back pain Hematologic:  [] Easy bruising  [] Easy bleeding   [] Hypercoagulable state   [x] Anemic  [] Hepatitis Gastrointestinal:  [] Blood in stool   [] Vomiting blood  [] Gastroesophageal reflux/heartburn   [] Difficulty swallowing. X positive for constipation Genitourinary:  [x] Chronic kidney disease   [] Difficult urination  [] Frequent urination  [] Burning with urination   [] Blood in urine Skin:  [] Rashes   [] Ulcers   [] Wounds Psychological:  [] History of anxiety   []  History of major depression.  Physical Examination  Vitals:   12/31/17 1300 12/31/17 1308 12/31/17 1316 12/31/17 1317  BP: (!) 141/47 134/72 128/81   Pulse: 80 76 76 76  Resp: 20 15 (!) 29 (!) 21  Temp:   98.1 F (36.7 C)   TempSrc:   Oral   SpO2: 92% 90% 95% 95%  Weight:   88.5 kg   Height:       Body mass index is 27.21 kg/m. Gen:  WD/WN, NAD Head: [place]/AT, No temporalis wasting.  Ear/Nose/Throat: Hearing grossly intact, nares w/o erythema or drainage, oropharynx w/o Erythema/Exudate Eyes: Sclera non-icteric, conjunctiva clear  Neck: Trachea midline.   Pulmonary:  Good air movement, respirations not labored Cardiac: RRR, no JVD Vascular: Good thrill is present in the left radiocephalic AV fistula which is moderately aneurysmal Vessel Right Left  Radial Palpable Palpable                                    Musculoskeletal: M/S 5/5 throughout.  Extremities without ischemic changes.  No deformity or atrophy.  Mild lower extremity edema. Neurologic: Sensation grossly intact in extremities.  Symmetrical.  Speech is fluent.  Motor exam as listed above. Psychiatric: Judgment intact, Mood & affect appropriate for pt's clinical situation. Dermatologic: No rashes or ulcers noted.  No cellulitis or open wounds.       CBC Lab Results  Component Value Date   WBC 6.2 12/31/2017   HGB 7.4 (L) 12/31/2017   HCT 18.0 (L) 12/31/2017   MCV 98.8 12/31/2017   PLT 141 (L) 12/31/2017    BMET    Component Value Date/Time   NA 135 12/31/2017 0027   NA 140 11/18/2013 1030   K 6.6 (HH) 12/31/2017 0027   K 4.0 11/18/2013 1030   CL 95 (L) 12/31/2017 0027   CL 97 (L) 11/18/2013 1030   CO2 24 12/31/2017 0027   CO2 33 (H) 11/18/2013 1030   GLUCOSE 527 (HH) 12/31/2017 0027   GLUCOSE 265 (H) 11/18/2013 1030   BUN 89 (H) 12/31/2017 0027   BUN 30 (H) 11/18/2013 1030   CREATININE 7.42 (H) 12/31/2017 0027   CREATININE 5.05 (H) 11/18/2013 1030   CALCIUM 6.7 (L) 12/31/2017 0027   CALCIUM 8.6 11/18/2013 1030   GFRNONAA 6 (L) 12/31/2017 0027   GFRNONAA 11 (L) 11/18/2013 1030   GFRAA 8 (L) 12/31/2017 0027   GFRAA 13 (L) 11/18/2013 1030   Estimated Creatinine Clearance: 9.6 mL/min (A) (by C-G formula based on SCr of 7.42 mg/dL (H)).  COAG Lab Results  Component Value Date   INR 1.09 12/15/2017   INR 1.10 02/01/2017   INR 0.9 01/31/2013    Radiology Dg Chest 2 View  Result Date: 12/24/2017 CLINICAL DATA:  Acute onset shortness of breath. EXAM: CHEST - 2 VIEW COMPARISON:  12/18/2017 and 12/15/2017 FINDINGS: Lungs are adequately inflated demonstrate a small right-sided pleural effusion likely with associated right basilar atelectasis unchanged. Interval improved left base opacification. Cardiomediastinal silhouette and remainder of the exam is unchanged. IMPRESSION: Stable small right pleural effusion likely with associated right basilar atelectasis. Near resolution of left base opacification. Electronically Signed   By: Marin Olp M.D.   On: 12/24/2017 21:43   Ct Head Wo Contrast  Result Date: 12/31/2017 CLINICAL  DATA:  Confusion, combative. Headache. On Eliquis. History of lymphoma, end-stage renal disease on dialysis, hypertension, stroke. EXAM: CT HEAD WITHOUT CONTRAST TECHNIQUE: Contiguous axial images were obtained from the base of the skull through the vertex without intravenous contrast. COMPARISON:  CT HEAD October 15, 2013 FINDINGS: BRAIN: No intraparenchymal hemorrhage, mass effect nor midline shift. The ventricles and sulci are normal for age. Minimal supratentorial white matter hypodensities less than expected for patient's age, though non-specific are most compatible with chronic small vessel ischemic disease. No acute large vascular territory infarcts. No abnormal extra-axial fluid collections. Basal cisterns are patent. VASCULAR: Moderate to severe calcific atherosclerosis of the carotid siphons. SKULL: No skull fracture. No significant scalp soft tissue swelling. SINUSES/ORBITS: Trace paranasal sinus mucosal thickening. Mastoid air cells are well  aerated.The included ocular globes and orbital contents are non-suspicious. Status post bilateral ocular lens implants. OTHER: None. IMPRESSION: Normal noncontrast CT HEAD for age. Electronically Signed   By: Elon Alas M.D.   On: 12/31/2017 01:13   Nm Pulmonary Perf And Vent  Result Date: 12/25/2017 CLINICAL DATA:  Chest pain. EXAM: NUCLEAR MEDICINE VENTILATION - PERFUSION LUNG SCAN TECHNIQUE: Ventilation images were obtained in multiple projections using inhaled aerosol Tc-15m DTPA. Perfusion images were obtained in multiple projections after intravenous injection of Tc-12m-MAA. RADIOPHARMACEUTICALS:  34.4 mCi of Tc-57m DTPA aerosol inhalation and 4.3 mCi Tc78m-MAA IV COMPARISON:  Chest x-ray 12/24/2017. FINDINGS: No ventilation perfusion mismatches noted. Mild ventilatory defects and perfusion defects are noted within the tori defects being larger. These are primarily in the bases and most likely related to previously identified basilar atelectasis and  pleural effusions. IMPRESSION: Low probability pulmonary embolus. Electronically Signed   By: Marcello Moores  Register   On: 12/25/2017 11:58   US Venous Img Lower Bilateral  Result Date: 12/25/2017 CLINICAL DATA:  Bilateral lower extremity pain/cramping for 1 day with dyspnea. EXAM: BILATERAL LOWER EXTREMITY VENOUS DOPPLER ULTRASOUND TECHNIQUE: Gray-scale sonography with graded compression, as well as color Doppler and duplex ultrasound were performed to evaluate the lower extremity deep venous systems from the level of the common femoral vein and including the common femoral, femoral, profunda femoral, popliteal and calf veins including the posterior tibial, peroneal and gastrocnemius veins when visible. The superficial great saphenous vein was also interrogated. Spectral Doppler was utilized to evaluate flow at rest and with distal augmentation maneuvers in the common femoral, femoral and popliteal veins. COMPARISON:  Left lower extremity venous Doppler scan dated 01/15/2017 FINDINGS: RIGHT LOWER EXTREMITY Common Femoral Vein: No evidence of thrombus. Normal compressibility, respiratory phasicity and response to augmentation. Saphenofemoral Junction: No evidence of thrombus. Normal compressibility and flow on color Doppler imaging. Profunda Femoral Vein: No evidence of thrombus. Normal compressibility and flow on color Doppler imaging. Femoral Vein: Nonocclusive thrombosis of the midportion of the right femoral vein. Patent compressible proximal and distal portions of the right femoral vein. Popliteal Vein: No evidence of thrombus. Normal compressibility, respiratory phasicity and response to augmentation. Calf Veins: No evidence of thrombus. Normal compressibility and flow on color Doppler imaging. Superficial Great Saphenous Vein: No evidence of thrombus. Normal compressibility. Venous Reflux:  None. Other Findings:  None. LEFT LOWER EXTREMITY Common Femoral Vein: No evidence of thrombus. Normal compressibility,  respiratory phasicity and response to augmentation. Saphenofemoral Junction: No evidence of thrombus. Normal compressibility and flow on color Doppler imaging. Profunda Femoral Vein: No evidence of thrombus. Normal compressibility and flow on color Doppler imaging. Femoral Vein: No evidence of thrombus. Normal compressibility, respiratory phasicity and response to augmentation. Popliteal Vein: No evidence of thrombus. Normal compressibility, respiratory phasicity and response to augmentation. Calf Veins: No evidence of thrombus. Normal compressibility and flow on color Doppler imaging. Superficial Great Saphenous Vein: No evidence of thrombus. Normal compressibility. Venous Reflux:  None. Other Findings:  None. IMPRESSION: 1. Nonocclusive deep venous thrombosis of the midportion of the right femoral vein. 2. Otherwise patent deep veins of the lower extremities bilaterally. Electronically Signed   By: Ilona Sorrel M.D.   On: 12/25/2017 03:00   Dg Chest Port 1 View  Result Date: 12/30/2017 CLINICAL DATA:  Weakness. EXAM: PORTABLE CHEST 1 VIEW COMPARISON:  Most recent radiograph 12/24/2017, 12/18/2017. Additional priors. FINDINGS: Low lung volumes. Increased pulmonary edema from prior exam. Small right pleural effusion is grossly unchanged. Streaky left lung base  atelectasis. Mild cardiomegaly is similar to priors. No pneumothorax. IMPRESSION: Increased pulmonary edema from prior exam. Small right pleural effusion is similar. Electronically Signed   By: Keith Rake M.D.   On: 12/30/2017 23:50   Dg Chest Port 1 View  Result Date: 12/18/2017 CLINICAL DATA:  Shortness of breath. EXAM: PORTABLE CHEST 1 VIEW COMPARISON:  Radiograph of December 15, 2017. FINDINGS: The heart size and mediastinal contours are within normal limits. Stable bilateral pulmonary edema is noted. Mild bilateral pleural effusions are noted which are increased compared to prior exam. No pneumothorax is noted. Atherosclerosis of thoracic  aorta is noted. The visualized skeletal structures are unremarkable. IMPRESSION: Bilateral pulmonary edema with mild bilateral pleural effusions. Aortic Atherosclerosis (ICD10-I70.0). Electronically Signed   By: Marijo Conception, M.D.   On: 12/18/2017 15:10   Dg Chest Port 1 View  Result Date: 12/15/2017 CLINICAL DATA:  Altered mental status with cough. EXAM: PORTABLE CHEST 1 VIEW COMPARISON:  01/15/2017 and PET-CT 11/30/2016 FINDINGS: Lungs are adequately inflated and demonstrate hazy prominence of the perihilar markings likely interstitial edema. No effusion. Cardiomediastinal silhouette and remainder the exam is unchanged. IMPRESSION: Findings suggesting mild interstitial edema. Electronically Signed   By: Marin Olp M.D.   On: 12/15/2017 21:06      Assessment/Plan 1.  DVT with significant anemia with effusion and cessation of anticoagulation.  This was recently initiated anticoagulation and the risk of embolization would be reasonably high.  As such, and the filter should be placed at this time.  I have discussed the reason and rationale for IVC filter placement with the patient.  I have discussed the procedure and what it involves.  This will be done this afternoon.  Patient is agreeable to proceed 2.  ESRD.  His left arm access is working well.  We have performed multiple previous procedures on this, but not in some time.  Continue to use for his dialysis 3.  CLL.  This is likely the cause of his hypercoagulable state.  Also has a history of bladder cancer. 4.  Hyperkalemia.  To get dialysis this afternoon.   Leotis Pain, MD  12/31/2017 2:00 PM    This note was created with Dragon medical transcription system.  Any error is purely unintentional

## 2017-12-31 NOTE — Progress Notes (Signed)
Post HD assessment    12/31/17 1314  Neurological  Level of Consciousness Alert  Orientation Level Oriented X4  Respiratory  Respiratory Pattern Regular  Chest Assessment Chest expansion symmetrical  Cardiac  ECG Monitor Yes  Cardiac Rhythm NSR  Vascular  R Radial Pulse +2  L Radial Pulse +2  Edema Generalized  Integumentary  Integumentary (WDL) X  Skin Color Appropriate for ethnicity  Musculoskeletal  Musculoskeletal (WDL) X  Generalized Weakness Yes  Assistive Device None  Gastrointestinal  Bowel Sounds Assessment Active  Additional Gastrointestinal Comments  (cramping)  GU Assessment  Genitourinary (WDL) X  Genitourinary Symptoms  (HD)  Psychosocial  Psychosocial (WDL) WDL  Patient Behaviors Appropriate for age;Appropriate for situation;Cooperative;Calm  Emotional support given Given to patient

## 2017-12-31 NOTE — Progress Notes (Signed)
Pre HD assessment   12/31/17 0915  Vital Signs  Temp 98.7 F (37.1 C)  Temp Source Oral  Pulse Rate 80  Pulse Rate Source Monitor  Resp 19  BP 124/74  BP Location Right Leg  BP Method Automatic  Patient Position (if appropriate) Lying  Oxygen Therapy  SpO2 92 %  O2 Device Nasal Cannula  O2 Flow Rate (L/min) 6 L/min  Pain Assessment  Pain Scale 0-10  Pain Score 5  Pain Type Acute pain  Pain Location Abdomen  Pain Descriptors / Indicators Cramping  Patients Stated Pain Goal 0  Pain Intervention(s) RN made aware  Dialysis Weight  Weight 91.6 kg  Type of Weight Pre-Dialysis  Time-Out for Hemodialysis  What Procedure? HD  Pt Identifiers(min of two) First/Last Name;MRN/Account#  Correct Site? Yes  Correct Side? Yes  Correct Procedure? Yes  Consents Verified? Yes  Rad Studies Available? N/A  Safety Precautions Reviewed? Yes  Engineer, civil (consulting) Number  (3A)  Station Number  (bedside ICU 15)  UF/Alarm Test Passed  Conductivity: Meter 13.8  Conductivity: Machine  13.6  pH 7.4  Reverse Osmosis  (WRO #3)  Normal Saline Lot Number [id number]  Dialyzer Lot Number [id number]  Disposable Set Lot Number [id number]  Machine Temperature 98.6 F (37 C)  Musician and Audible Yes  Blood Lines Intact and Secured Yes  Pre Treatment Patient Checks  Vascular access used during treatment Fistula  Hepatitis B Surface Antigen Results Negative (unk, out of date, labs drawn 12/31/17)  Date Hepatitis B Surface Antigen Drawn 11/26/17  Hepatitis B Surface Antibody  (<10)  Date Hepatitis B Surface Antibody Drawn 04/30/17  Hemodialysis Consent Verified Yes  Hemodialysis Standing Orders Initiated Yes  ECG (Telemetry) Monitor On Yes  Prime Ordered Normal Saline  Length of  DialysisTreatment -hour(s) 3.5 Hour(s)  Dialyzer Elisio 17H NR  Dialysate 2K, 2.5 Ca  Dialysis Anticoagulant None  Dialysate Flow Ordered 800  Blood Flow Rate Ordered 400 mL/min  Ultrafiltration Goal 2 Liters   Pre Treatment Labs Hepatitis B Surface Antigen;Phosphorus (PTH, A1C)  Blood Products Ordered Packed Red Blood Cells  Dialysis Blood Pressure Support Ordered Normal Saline  Education / Care Plan  Dialysis Education Provided Yes  Documented Education in Care Plan Yes  Fistula / Graft Left Forearm Arteriovenous fistula  No Placement Date or Time found.   Placed prior to admission: No  Orientation: Left  Access Location: Forearm  Access Type: Arteriovenous fistula  Site Condition No complications  Fistula / Graft Assessment Present;Thrill;Bruit  Drainage Description None

## 2017-12-31 NOTE — Progress Notes (Signed)
Pre HD assessment    12/31/17 0916  Neurological  Level of Consciousness Alert  Orientation Level Oriented X4  Respiratory  Respiratory Pattern Regular  Chest Assessment Chest expansion symmetrical  Cardiac  ECG Monitor Yes  Cardiac Rhythm NSR  Vascular  R Radial Pulse +2  L Radial Pulse +2  Edema Generalized  Integumentary  Integumentary (WDL) X  Skin Color Appropriate for ethnicity  Musculoskeletal  Musculoskeletal (WDL) X  Generalized Weakness Yes  Assistive Device None  Gastrointestinal  Bowel Sounds Assessment Active  Additional Gastrointestinal Comments  (cramping )  GU Assessment  Genitourinary (WDL) X  Genitourinary Symptoms  (HD)  Psychosocial  Psychosocial (WDL) WDL  Patient Behaviors Cooperative;Calm;Appropriate for situation;Appropriate for age  Emotional support given Given to patient

## 2017-12-31 NOTE — ED Notes (Signed)
Per convo with Dr Oren Bracket, one transfuse 1 unit att, [place], blood bank, called and notified

## 2017-12-31 NOTE — Progress Notes (Signed)
HD tx end    12/31/17 1308  Vital Signs  Pulse Rate 76  Pulse Rate Source Monitor  Resp 15  BP 134/72  BP Location Right Leg  BP Method Automatic  Patient Position (if appropriate) Lying  Oxygen Therapy  SpO2 90 %  O2 Device Nasal Cannula  O2 Flow Rate (L/min) 3.5 L/min  During Hemodialysis Assessment  Dialysis Fluid Bolus Normal Saline  Bolus Amount (mL) 250 mL  Intra-Hemodialysis Comments Tx completed

## 2017-12-31 NOTE — Care Management (Addendum)
Estill Bamberg with [facility] (outpatient hemodialysis) updated on patient's presentation. I have also updated Joelene Millin with [facility] home health. Patient is on chronic O2 at home. Palliative consult noted; patient is followed by home palliative team.  RNCM will follow.

## 2017-12-31 NOTE — Consult Note (Signed)
[place] Nurse wound consult note Reason for Consult: left foot Wound type: trauma Pressure Injury POA: NA Measurement: Posterior left heel:  1.2cm x 0.2cm x 0.1cm partial thickness abrasion with dried serum (scab) noted.  No drainage.   Anterior foot at base of 5th digit:  0.2cm round x 0.1cm partial thickness wound. No drainage. Patient states he "bumped" it yesterday. Wound bed:As described above Drainage (amount, consistency, odor) As described above Periwound:intact, dry Dressing procedure/placement/frequency: Bedside RN present for my assessment and in agreement that these partial thickness tissue injuries will repair with conservative care.  She will cleanse the two areas with NS, and apply moisturizer later today when patient is finished with HD treatment.  [place] nursing team will not follow, but will remain available to this patient, the nursing and medical teams.  Please re-consult if needed. Thanks, Maudie Flakes, MSN, RN, [place], Arther Abbott  Pager# [phone]

## 2017-12-31 NOTE — H&P (Signed)
Mark [name] at [place] NAME: Mark [name]    MR#:  [id number]  DATE OF BIRTH:  02/08/1946  DATE OF ADMISSION:  12/30/2017  PRIMARY CARE PHYSICIAN: Albina Billet, MD   REQUESTING/REFERRING PHYSICIAN: Paulette Blanch, MD  CHIEF COMPLAINT:   Chief Complaint  Patient presents with  . Altered Mental Status    HISTORY OF PRESENT ILLNESS:  Mark [name]  is a 72 y.o. male with a known history of ESRD (HD M/W/F via LUE AVF), SLL/CLL (requiring multiple pRBC txfn), COPD (3L [place] continuous O2, 4L [place] O2 qHS), T2IDDM, HTN, HLD, recent RLE DVT (Eliquis), chronic diastolic CHF (EF 93% w/ grade II diastolic dysfxn + mild LAE as of 12/07/2017 Echo @ [facility]), who now p/w AMS/agitation, SOB, acute on chronic hypoxemic respiratory failure, volume overload, anemia. At the time of my assessment, pt is AAOx3. He is incredibly sharp. He has good insight into his medical conditions and treatment. He is able to tell me a good deal about his recent hospitalizations. His son and daughter are at bedside, and are very supportive. Unfortunately for him, however, he now suffers from multiple (three, by my count) end-stage organ/system pathologies. He has spent the majority of his September 2019 in the hospital. He was hospitalized at [facility] from 09/05 to 09/12 with SOB, acute on chronic hypoxemic respiratory failure, volume overload. He was also found to have a R breast mass during that hospitalization. He was hospitalized at [facility] from 09/14 to 09/22 with AMS, sepsis and possible pneumonia. He was hospitalized at [facility] from 09/24 to 09/26 for RLE DVT, at which time he was started on Eliquis, with the corollary that pt may need a filter if he has issues with continued anemia and/or bleeding. He is now hospitalized at [facility] for the fourth time this month alone. The pt himself seems to have a good understanding of what this portends with regards to his overall global condition and long-term  prognosis.  The history surrounding his present hospitalization is not entirely clear. His daughter tells me that he was "talking out of his head". The pt himself tells me he was situationally annoyed/agitated at something that happened during the day. There is no report of disorientation. The family was nonetheless concerned for infection, as pt has had AMS/agitation/combativeness w/ UTI in the past. He denies hematuria or burning/pain on urination. When he was agitated, he says his nasal cannula was not on properly. He reportedly had a low oxygen saturation. He continues to have low oxygen saturation at the time of my assessment. He is tachypneic and in mild respiratory distress, but does not exhibit conversational dyspnea, tripoding, accessory muscle use, or retractions. He has bibasilar fine crackles on exam. I also appreciate some coarse inspiratory + expiratory rhonchi over the L lung fields. CXR (+) pulmonary edema + small right pleural effusion, w/o mention of infiltrate/consolidation. K+ 6.6. Hgb 6.0.  Pt tells me he lives at home with his wife. He had a full hemodialysis session on Friday 12/28/2017, and he states that in addition to his normal session, an extra 0.3L of volume was removed. He states that he still makes ~2cc of urine per day. He denies any urinary symptoms. He endorses SOB and minimal non-productive cough. He denies chest pain. He endorses low back pain. He endorses fatigue/malaise/generalized weakness. His L eye is red, but it has apparently been this way for close to two years, per his daughter. He is uncomfortable (due  to his low back pain), and appears sickly/chronically ill, but does not look septic/toxic. He denies any other significant pain complaints.  PAST MEDICAL HISTORY:   Past Medical History:  Diagnosis Date  . Anemia   . Arthritis   . Bladder cancer ([place])   . Chronic kidney disease   . Diabetes mellitus without complication ([place])   . Dialysis patient [facility])     M,W,F  . Dyspnea    DOE  . GERD (gastroesophageal reflux disease)   . HOH (hard of hearing)   . Hypertension   . IBS (irritable bowel syndrome)   . Lymphoma ([place]) 09/27/2014  . Neuropathy    RIGHT LEG  . Stroke [facility])    TIA    PAST SURGICAL HISTORY:   Past Surgical History:  Procedure Laterality Date  . BACK SURGERY  2007  . CATARACT EXTRACTION W/PHACO Left 03/23/2016   Procedure: CATARACT EXTRACTION PHACO AND INTRAOCULAR LENS PLACEMENT (IOC);  Surgeon: Leandrew Koyanagi, MD;  Location: [facility] ORS;  Service: Ophthalmology;  Laterality: Left;  [place] 52.6 AP% 14.7 CDE 7.72 Fluid pack lot # [id number]  . CATARACT EXTRACTION W/PHACO Right 05/30/2016   Procedure: CATARACT EXTRACTION PHACO AND INTRAOCULAR LENS PLACEMENT ([place]);  Surgeon: Leandrew Koyanagi, MD;  Location: [facility] ORS;  Service: Ophthalmology;  Laterality: Right;  [place] 01:08 AP% 13.9 CDE 9.53  note: could not get IV in patient, so procedure done without anesthesia personell present, ok per Dr Wallace Going fluid pack lo t# [id number]  . CIRCUMCISION    . PERIPHERAL VASCULAR CATHETERIZATION Left 08/19/2015   Procedure: A/V Shuntogram/Fistulagram;  Surgeon: Algernon Huxley, MD;  Location: [place] CV LAB;  Service: Cardiovascular;  Laterality: Left;  . PERIPHERAL VASCULAR CATHETERIZATION N/A 08/19/2015   Procedure: A/V Shunt Intervention;  Surgeon: Algernon Huxley, MD;  Location: [place] CV LAB;  Service: Cardiovascular;  Laterality: N/A;    SOCIAL HISTORY:   Social History   Tobacco Use  . Smoking status: Former Smoker    Types: Cigarettes    Last attempt to quit: 05/19/2013    Years since quitting: 4.6  . Smokeless tobacco: Never Used  . Tobacco comment: quit  Substance Use Topics  . Alcohol use: No    Alcohol/week: 0.0 standard drinks    FAMILY HISTORY:   Family History  Problem Relation Age of Onset  . Cancer Mother   . Kidney cancer Neg Hx   . Prostate cancer Neg Hx   . Kidney failure Neg Hx   . Bladder  Cancer Neg Hx     DRUG ALLERGIES:   Allergies  Allergen Reactions  . Heparin Nausea Only  . Ibuprofen Other (See Comments)    DUE TO DIALYSIS  . Multivitamin [Centrum] Other (See Comments)    DUE TO DIALYSIS  . Daypro [Oxaprozin] Swelling and Rash    Other reaction(s): Other (See Comments)  . Tape Rash    REVIEW OF SYSTEMS:   Review of Systems  Constitutional: Positive for malaise/fatigue. Negative for chills, diaphoresis, fever and weight loss.  HENT: Negative for congestion, ear pain, hearing loss, nosebleeds, sinus pain, sore throat and tinnitus.   Eyes: Negative for blurred vision, double vision and photophobia.  Respiratory: Positive for cough and shortness of breath. Negative for hemoptysis, sputum production and wheezing.   Cardiovascular: Negative for chest pain, palpitations, orthopnea, claudication, leg swelling and PND.  Gastrointestinal: Negative for abdominal pain, blood in stool, constipation, diarrhea, heartburn, melena, nausea and vomiting.  Genitourinary: Negative for dysuria, frequency, hematuria and  urgency.  Musculoskeletal: Positive for back pain. Negative for falls, joint pain, myalgias and neck pain.  Skin: Negative for itching and rash.  Neurological: Positive for weakness. Negative for dizziness, tingling, tremors, sensory change, speech change, focal weakness, seizures, loss of consciousness and headaches.  Endo/Heme/Allergies: Does not bruise/bleed easily.  Psychiatric/Behavioral: Negative for memory loss. The patient does not have insomnia.    MEDICATIONS AT HOME:   Prior to Admission medications   Medication Sig Start Date End Date Taking? Authorizing Provider  albuterol (PROVENTIL) (2.5 MG/3ML) 0.083% nebulizer solution Take 2.5 mg by nebulization every 6 (six) hours as needed for wheezing or shortness of breath.   Yes [provider]  apixaban (ELIQUIS) 5 MG TABS tablet Take 2 tablets (10 mg total) by mouth 2 (two) times daily for 5 days.  12/27/17 01/01/18 Yes Gladstone Lighter, MD  budesonide-formoterol (SYMBICORT) 160-4.5 MCG/ACT inhaler Inhale 2 puffs into the lungs 2 (two) times daily. 12/27/17  Yes Gladstone Lighter, MD  Difluprednate (DUREZOL) 0.05 % EMUL Place 1 drop into the left eye 2 (two) times daily.    Yes [provider]  docusate sodium (COLACE) 100 MG capsule Take 100 mg by mouth daily as needed for mild constipation.   Yes [provider]  ENSURE PLUS (ENSURE PLUS) LIQD Take 1 Can by mouth daily.  01/26/14  Yes [provider]  fluticasone (FLONASE) 50 MCG/ACT nasal spray Place 1 spray into both nostrils daily as needed for allergies.  12/14/14  Yes [provider]  gabapentin (NEURONTIN) 100 MG capsule Take 1 capsule (100 mg total) by mouth 3 (three) times daily. 03/02/16  Yes Edrick Kins, DPM  glimepiride (AMARYL) 4 MG tablet Take 4 mg by mouth at bedtime. Prn glucose levels 10/06/14  Yes [provider]  LEVEMIR FLEXTOUCH 100 UNIT/ML Pen Inject 5-6 Units into the skin daily at 10 pm.  08/21/14  Yes [provider]  lovastatin (MEVACOR) 20 MG tablet Take 1 tablet by mouth daily. 12/25/16  Yes [provider]  metoCLOPramide (REGLAN) 5 MG tablet Take 5 mg by mouth 3 (three) times daily before meals. Patient takes 1 tab with each meal.   Yes [provider]  multivitamin (RENA-VIT) TABS tablet Take 1 tablet by mouth at bedtime.    Yes [provider]  nystatin cream (MYCOSTATIN) Apply 1 application topically 2 (two) times daily as needed (irritation).    Yes [provider]  Polyvinyl Alcohol-Povidone PF (REFRESH) 1.4-0.6 % SOLN Apply 1 drop to eye 3 (three) times daily as needed (Dry eyes).   Yes [provider]  predniSONE (DELTASONE) 10 MG tablet Take 4 tab PO daily x 1 day and taper off by 10 every day 12/27/17  Yes Gladstone Lighter, MD  silver sulfADIAZINE (SILVADENE) 1 % cream APPLY 1 APPLICATION TOPICALY  DAILY Patient taking differently: Apply 1 application topically daily as needed (sores).  04/12/17  Yes Edrick Kins, DPM  torsemide (DEMADEX) 100 MG tablet Take 1 tablet (100 mg total) by mouth daily. Please take on Non dialysis days- Tues, Thurs, Saturday and sunday 12/28/17  Yes Gladstone Lighter, MD  apixaban (ELIQUIS) 5 MG TABS tablet Take 1 tablet (5 mg total) by mouth 2 (two) times daily. Patient not taking: Reported on 12/30/2017 01/01/18   Gladstone Lighter, MD      VITAL SIGNS:  Blood pressure (!) 152/70, pulse 87, temperature 98.2 F (36.8 C), temperature source Oral, resp. rate (!) 26, height 5\' 11"  (1.803 m), weight  74.8 kg, SpO2 100 %.  PHYSICAL EXAMINATION:  Physical Exam  Constitutional: He is oriented to person, place, and time. He appears well-developed and well-nourished. He is active and cooperative.  Non-toxic appearance. He has a sickly appearance. He appears ill. No distress. He is not intubated.  HENT:  Head: Normocephalic and atraumatic.  Mouth/Throat: Oropharynx is clear and moist. No oropharyngeal exudate.  Eyes: EOM and lids are normal. Left eye exhibits chemosis. Left conjunctiva is injected. Left conjunctiva has a hemorrhage. No scleral icterus.  Neck: Neck supple. No JVD present. No thyromegaly present.  Cardiovascular: Normal rate, regular rhythm, S1 normal, S2 normal and normal heart sounds.  No extrasystoles are present. Exam reveals no gallop, no S3, no S4, no distant heart sounds and no friction rub.  No murmur heard. Pulmonary/Chest: No accessory muscle usage or stridor. Tachypnea noted. No apnea and no bradypnea. He is not intubated. He is in respiratory distress. He has decreased breath sounds in the right upper field, the right middle field, the right lower field, the left upper field, the left middle field and the left lower field. He has no wheezes. He has rhonchi in the left upper field and the left middle field. He has rales in the right lower field  and the left lower field.  Abdominal: Soft. He exhibits no distension. Bowel sounds are decreased. There is no tenderness. There is no rigidity, no rebound and no guarding.  Musculoskeletal: Normal range of motion. He exhibits no edema or tenderness.  Lymphadenopathy:    He has no cervical adenopathy.  Neurological: He is alert and oriented to person, place, and time. He is not disoriented.  Skin: Skin is warm and dry. No rash noted. He is not diaphoretic. No erythema.  Psychiatric: He has a normal mood and affect. His speech is normal and behavior is normal. Judgment and thought content normal. Cognition and memory are normal.   LABORATORY PANEL:   CBC Recent Labs  Lab 12/31/17 0027  WBC 6.2  HGB 6.0*  HCT 18.0*  PLT 141*   ------------------------------------------------------------------------------------------------------------------  Chemistries  Recent Labs  Lab 12/31/17 0027  NA 135  K 6.6*  CL 95*  CO2 24  GLUCOSE 527*  BUN 89*  CREATININE 7.42*  CALCIUM 6.7*  AST 20  ALT 24  ALKPHOS 76  BILITOT 1.0   ------------------------------------------------------------------------------------------------------------------  Cardiac Enzymes Recent Labs  Lab 12/31/17 0027  TROPONINI 0.03*   ------------------------------------------------------------------------------------------------------------------  RADIOLOGY:  Ct Head Wo Contrast  Result Date: 12/31/2017 CLINICAL DATA:  Confusion, combative. Headache. On Eliquis. History of lymphoma, end-stage renal disease on dialysis, hypertension, stroke. EXAM: CT HEAD WITHOUT CONTRAST TECHNIQUE: Contiguous axial images were obtained from the base of the skull through the vertex without intravenous contrast. COMPARISON:  CT HEAD October 15, 2013 FINDINGS: BRAIN: No intraparenchymal hemorrhage, mass effect nor midline shift. The ventricles and sulci are normal for age. Minimal supratentorial white matter hypodensities less than  expected for patient's age, though non-specific are most compatible with chronic small vessel ischemic disease. No acute large vascular territory infarcts. No abnormal extra-axial fluid collections. Basal cisterns are patent. VASCULAR: Moderate to severe calcific atherosclerosis of the carotid siphons. SKULL: No skull fracture. No significant scalp soft tissue swelling. SINUSES/ORBITS: Trace paranasal sinus mucosal thickening. Mastoid air cells are well aerated.The included ocular globes and orbital contents are non-suspicious. Status post bilateral ocular lens implants. OTHER: None. IMPRESSION: Normal noncontrast CT HEAD for age. Electronically Signed   By: Elon Alas M.D.   On:  12/31/2017 01:13   Dg Chest Port 1 View  Result Date: 12/30/2017 CLINICAL DATA:  Weakness. EXAM: PORTABLE CHEST 1 VIEW COMPARISON:  Most recent radiograph 12/24/2017, 12/18/2017. Additional priors. FINDINGS: Low lung volumes. Increased pulmonary edema from prior exam. Small right pleural effusion is grossly unchanged. Streaky left lung base atelectasis. Mild cardiomegaly is similar to priors. No pneumothorax. IMPRESSION: Increased pulmonary edema from prior exam. Small right pleural effusion is similar. Electronically Signed   By: Keith Rake M.D.   On: 12/30/2017 23:50   IMPRESSION AND PLAN:   A/P: 21M w/ PMHx ESRD, SLL/CLL, COPD p/w 1d Hx AMS/agitation, SOB, acute on chronic hypoxemic respiratory failure, volume overload, normocytic anemia. Hyperkalemia, hyperglycemia (w/ T2IDDM), ESRD, hypocalcemia, hypoproteinemia, thrombocytopenia. -SOB, acute on chronic hypoxemic respiratory failure, volume overload: Suspected to be multifactorial. CXR (+) pulmonary edema + small R effusion, suggesting volume overload. This despite having full (and extra) HD on Friday 09/27. Exam (+) L lung rhonchi, suggesting a mild acute COPD exacerbation. Anemic (discussed below). Has known chronic diastolic CHF (grade II as of 12/07/2017  Echo). Will need HD for mgmt of hypervolemia. Nebs, steroids (IV, transition to PO). LABA/ICS. ABx not indicated at present. 1u pRBC txfn. Cardiac monitoring, pulse oximetry, O2. May need BiPAP overnight. -Normocytic anemia: Hgb 6.0. Normocytic but borderline macrocytic, MCV 98.8. Suspected 2/2 SLL/CLL + ESRD.  Pt's daughter does note that pt typically has bleeding at LUE AVF cannulation site for ~1d s/p HD, which is supposedly normal for him, and this has reportedly been no worse as of late (despite pt having been recently started on Eliquis for RLE DVT). 1u pRBC txfn. Will avoid additional txfn, given volume overload. I expect the 1u itself will worsen his respiratory symptoms, owing to extra volume. Will need HD. Nephrology consult. Given SLL/CLL, I expect Procrit/Epogen to provide little to no benefit. No other significant overt bleeds. On pt's last admission, there was some suggestion of placing an IVC filter if pt had bleeding or continued anemia. I have continued Eliquis for now. Hematology/Oncology consult. Monitor Hgb. -AMS/agitation: AAOx3. By my assessment, pt appears to be mentating well. CT head (-) acute intracranial abnl. -Hyperkalemia: K+ 6.6. Pt s/p calcium gluconate, bicarbonate, D50 + insulin, albuterol, Bumex, Kayexalate in ED. Nephrology consult for HD. -Hyperglycemia/T2IDDM: SSI. Lantus. -ESRD: HD M/W/F via LUE AVF. Nephrology consult. -Hypocalcemia: Ionized calcium, PTHi, vitD. -Hypoproteinemia: Suspect malnourishment. Prealbumin. -Thrombocytopenia: Plt 141. Likely 2/2 SLL/CLL. -Low back pain: Foam wedge, turn q2h, specialty bed (if available). -Skin breakdown: Wound/ostomy nurse consult. -c/w home meds/formulary subs. -FEN/GI: Renal diabetic free water restricted diet. -DVT PPx: Eliquis. -Code status: DNR/DNI at pt request. -Disposition: Admission, > 2 midnights. -Philosophy of care: Had long discussion w/ pt + family. Pt w/ multiple (three, by my count) end-stage organ/system  pathologies. Has spent most of this month in the hospital. He appears poorly. Overall prognosis is poor. Palliative Care consult for mgmt of symptoms. Pt is also Hospice appropriate, as I do not expect him to survive > 75mo.   All the records are reviewed and case discussed with ED provider. Management plans discussed with the patient, family and they are in agreement.  CODE STATUS: DNR/DNI.  TOTAL TIME TAKING CARE OF THIS PATIENT: 110 minutes.    Arta Silence M.D on 12/31/2017 at 3:12 AM  Between 7am to 6pm - Pager - [phone]  After 6pm go to [email or web address] - [facility]  Office  [phone]  CC: Primary care physician; Benita Stabile  C, MD   Note: This dictation was prepared with Dragon dictation along with smaller phrase technology. Any transcriptional errors that result from this process are unintentional.

## 2017-12-31 NOTE — Progress Notes (Signed)
[facility]  ROUNDING NOTE   Subjective:  Patient readmitted with altered mental status.  Currently he is awake and alert and recognizes where he is. Found to be quite anemic with a hemoglobin of 6.0 and currently undergoing blood transfusion. Serum potassium also quite high at 6.6 and he will be dialyzed shortly.    Objective:  Vital signs in last 24 hours:  Temp:  [98.1 F (36.7 C)-98.7 F (37.1 C)] 98.7 F (37.1 C) (09/30 0915) Pulse Rate:  [72-99] 80 (09/30 0915) Resp:  [14-27] 19 (09/30 0915) BP: (105-189)/(46-98) 124/74 (09/30 0915) SpO2:  [82 %-100 %] 92 % (09/30 0915) Weight:  [74.8 kg-91.6 kg] 91.6 kg (09/30 0915)  Weight change:  Filed Weights   12/30/17 2251 12/31/17 0915  Weight: 74.8 kg 91.6 kg    Intake/Output: I/O last 3 completed shifts: In: 531.7 [Blood:420; IV Piggyback:111.7] Out: -    Intake/Output this shift:  No intake/output data recorded.  Physical Exam: General: No acute distress  Head: Normocephalic, atraumatic. Moist oral mucosal membranes  Eyes: Anicteric, left conjunctival erythema  Neck: Supple, trachea midline  Lungs:  Scattered rhonchi, normal effort  Heart: regular  Abdomen:  Soft, nontender, BS present  Extremities: No peripheral edema.  Neurologic: Awake, alert, following commands  Skin: No lesions  Access: Left forearm AVF    Basic Metabolic Panel: Recent Labs  Lab 12/24/17 2122 12/31/17 0027 12/31/17 0436  NA 133* 135  --   K 4.7 6.6*  --   CL 93* 95*  --   CO2 29 24  --   GLUCOSE 428* 527*  --   BUN 51* 89*  --   CREATININE 4.13* 7.42*  --   CALCIUM 7.8* 6.7*  --   MG  --   --  2.7*  PHOS  --   --  4.7*    Liver Function Tests: Recent Labs  Lab 12/24/17 2122 12/31/17 0027  AST 17 20  ALT 23 24  ALKPHOS 82 76  BILITOT 1.1 1.0  PROT 6.4* 5.6*  ALBUMIN 3.8 3.5   No results for input(s): LIPASE, AMYLASE in the last 168 hours. No results for input(s): AMMONIA in the last 168  hours.  CBC: Recent Labs  Lab 12/24/17 2122 12/31/17 0027  WBC 6.1 6.2  NEUTROABS 5.5 5.9  HGB 9.1* 6.0*  HCT 27.4* 18.0*  MCV 96.3 98.8  PLT 159 141*    Cardiac Enzymes: Recent Labs  Lab 12/24/17 2122 12/31/17 0027  TROPONINI 0.04* 0.03*    BNP: Invalid input(s): POCBNP  CBG: Recent Labs  Lab 12/27/17 0747 12/27/17 0958 12/27/17 1200 12/31/17 0430 12/31/17 0751  GLUCAP 183* 212* 252* 437* 389*    Microbiology: Results for orders placed or performed during the hospital encounter of 12/30/17  MRSA PCR Screening     Status: None   Collection Time: 12/31/17  4:43 AM  Result Value Ref Range Status   MRSA by PCR NEGATIVE NEGATIVE Final    Comment:        The GeneXpert MRSA Assay (FDA approved for NASAL specimens only), is one component of a comprehensive MRSA colonization surveillance program. It is not intended to diagnose MRSA infection nor to guide or monitor treatment for MRSA infections. Performed at [facility], [address] [place], [place] [address]     Coagulation Studies: No results for input(s): LABPROT, INR in the last 72 hours.  Urinalysis: Recent Labs    12/31/17 0113  COLORURINE AMBER*  LABSPEC 1.014  PHURINE 5.0  GLUCOSEU 50*  HGBUR MODERATE*  BILIRUBINUR NEGATIVE  KETONESUR NEGATIVE  PROTEINUR 100*  NITRITE NEGATIVE  LEUKOCYTESUR NEGATIVE      Imaging: Ct Head Wo Contrast  Result Date: 12/31/2017 CLINICAL DATA:  Confusion, combative. Headache. On Eliquis. History of lymphoma, end-stage renal disease on dialysis, hypertension, stroke. EXAM: CT HEAD WITHOUT CONTRAST TECHNIQUE: Contiguous axial images were obtained from the base of the skull through the vertex without intravenous contrast. COMPARISON:  CT HEAD October 15, 2013 FINDINGS: BRAIN: No intraparenchymal hemorrhage, mass effect nor midline shift. The ventricles and sulci are normal for age. Minimal supratentorial white matter hypodensities less than expected  for patient's age, though non-specific are most compatible with chronic small vessel ischemic disease. No acute large vascular territory infarcts. No abnormal extra-axial fluid collections. Basal cisterns are patent. VASCULAR: Moderate to severe calcific atherosclerosis of the carotid siphons. SKULL: No skull fracture. No significant scalp soft tissue swelling. SINUSES/ORBITS: Trace paranasal sinus mucosal thickening. Mastoid air cells are well aerated.The included ocular globes and orbital contents are non-suspicious. Status post bilateral ocular lens implants. OTHER: None. IMPRESSION: Normal noncontrast CT HEAD for age. Electronically Signed   By: Elon Alas M.D.   On: 12/31/2017 01:13   Dg Chest Port 1 View  Result Date: 12/30/2017 CLINICAL DATA:  Weakness. EXAM: PORTABLE CHEST 1 VIEW COMPARISON:  Most recent radiograph 12/24/2017, 12/18/2017. Additional priors. FINDINGS: Low lung volumes. Increased pulmonary edema from prior exam. Small right pleural effusion is grossly unchanged. Streaky left lung base atelectasis. Mild cardiomegaly is similar to priors. No pneumothorax. IMPRESSION: Increased pulmonary edema from prior exam. Small right pleural effusion is similar. Electronically Signed   By: Keith Rake M.D.   On: 12/30/2017 23:50     Medications:   . sodium chloride    . sodium chloride    . sodium chloride    . sodium chloride    . famotidine (PEPCID) IV    . insulin (NOVOLIN-R) infusion Stopped (12/31/17 0806)   . bumetanide  1 mg Intravenous Once  . Chlorhexidine Gluconate Cloth  6 each Topical Q0600  . Difluprednate  1 drop Left Eye BID  . feeding supplement (ENSURE ENLIVE)  237 mL Oral Daily  . gabapentin  100 mg Oral TID  . insulin aspart  0-15 Units Subcutaneous TID WC  . insulin glargine  6 Units Subcutaneous QHS  . ipratropium-albuterol  3 mL Nebulization Q6H  . mouth rinse  15 mL Mouth Rinse BID  . methylPREDNISolone (SOLU-MEDROL) injection  60 mg Intravenous  Q12H  . metoCLOPramide  5 mg Oral TID AC  . mometasone-formoterol  2 puff Inhalation BID  . multivitamin  1 tablet Oral QHS  . pravastatin  20 mg Oral q1800  . [START ON 01/01/2018] predniSONE  40 mg Oral Q breakfast  . [START ON 01/01/2018] torsemide  100 mg Oral Once per day on Sun Tue Thu Sat   sodium chloride, sodium chloride, acetaminophen **OR** acetaminophen, albuterol, alteplase, bisacodyl, dextrose, ipratropium-albuterol, lidocaine (PF), lidocaine-prilocaine, ondansetron **OR** ondansetron (ZOFRAN) IV, pentafluoroprop-tetrafluoroeth, polyvinyl alcohol, senna-docusate  Assessment/ Plan:  Mark [name]. is a 72 y.o. black male with end stage renal disease on hemodialysis, CLL, diabetes mellitus type II, COPD, history of bladder cancer who is admitted to [facility] on 12/30/2017 for shortness of breath.   [facility] MWF [facility] AVF 90.5kg  1.  ESRD on HD MWF. Patient due for hemodialysis today and orders have been prepared.  2.  Anemia of  chronic kidney disease.  Hemoglobin has dropped quite significantly again down to 6.0.  Currently undergoing blood transfusion.  3.  Secondary hyperparathyroidism   Check intact PTH and phosphorus with dialysis today.  4. Rt femoral DVT -Previously on Eliquis for this issue.  Now with a low hemoglobin of 6.0 as above.  5.  Hyperkalemia.  Serum potassium quite high at 6.6.  We will plan for dialysis as above which should help to bring down serum potassium.  Continue to monitor serum potassium over the course of the hospitalization.   LOS: 0 Abbrielle Batts 9/30/20199:22 AM

## 2017-12-31 NOTE — Consult Note (Signed)
[place] CONSULT NOTE  Patient Care Team: Albina Billet, MD as PCP - General (Internal Medicine)  CHIEF COMPLAINTS/PURPOSE OF CONSULTATION:  Severe anemia/DVT  HISTORY OF PRESENTING ILLNESS:  Mark [name]. 72 y.o.  male multiple medical problems including end-stage renal disease; chronic respiratory failure-COPD/CHF; severe anemia end-stage renal disease on hemodialysis; CLL-currently off ibrutinib is currently in the hospital for mental status changes.  On admission patient noted to have hyperkalemia; and also elevated blood sugars.  Of note patient was recently diagnosed with right lower extremity DVT nonocclusive femoral for which he was on Eliquis.  Patient denies any blood in stools black or stools.  However on admission also noted to have severe anemia of hemoglobin 6 [chronic; but recently worsened].  He is currently status post IVC filter placement this afternoon.  Is currently not on Eliquis.  However at this time patient is lucid.  His biggest concern is being stuck multiple times for blood draws/IV antibiotics etc.  He is insistent on getting a port placed.   ROS: A complete 10 point review of system is done which is negative except mentioned above in history of present illness  MEDICAL HISTORY:  Past Medical History:  Diagnosis Date  . Anemia   . Arthritis   . Bladder cancer ([place])   . Chronic kidney disease   . Diabetes mellitus without complication ([place])   . Dialysis patient [facility])    M,W,F  . Dyspnea    DOE  . GERD (gastroesophageal reflux disease)   . HOH (hard of hearing)   . Hypertension   . IBS (irritable bowel syndrome)   . Lymphoma ([place]) 09/27/2014  . Neuropathy    RIGHT LEG  . Stroke [facility])    TIA    SURGICAL HISTORY: Past Surgical History:  Procedure Laterality Date  . BACK SURGERY  2007  . CATARACT EXTRACTION W/PHACO Left 03/23/2016   Procedure: CATARACT EXTRACTION PHACO AND INTRAOCULAR LENS PLACEMENT (IOC);  Surgeon: Leandrew Koyanagi, MD;  Location: [facility] ORS;  Service: Ophthalmology;  Laterality: Left;  [place] 52.6 AP% 14.7 CDE 7.72 Fluid pack lot # [id number]  . CATARACT EXTRACTION W/PHACO Right 05/30/2016   Procedure: CATARACT EXTRACTION PHACO AND INTRAOCULAR LENS PLACEMENT ([place]);  Surgeon: Leandrew Koyanagi, MD;  Location: [facility] ORS;  Service: Ophthalmology;  Laterality: Right;  [place] 01:08 AP% 13.9 CDE 9.53  note: could not get IV in patient, so procedure done without anesthesia personell present, ok per Dr Wallace Going fluid pack lo t# [id number]  . CIRCUMCISION    . PERIPHERAL VASCULAR CATHETERIZATION Left 08/19/2015   Procedure: A/V Shuntogram/Fistulagram;  Surgeon: Algernon Huxley, MD;  Location: [place] CV LAB;  Service: Cardiovascular;  Laterality: Left;  . PERIPHERAL VASCULAR CATHETERIZATION N/A 08/19/2015   Procedure: A/V Shunt Intervention;  Surgeon: Algernon Huxley, MD;  Location: [place] CV LAB;  Service: Cardiovascular;  Laterality: N/A;    SOCIAL HISTORY: Social History   Socioeconomic History  . Marital status: Married    Spouse name: Not on file  . Number of children: Not on file  . Years of education: Not on file  . Highest education level: Not on file  Occupational History  . Occupation: retired  Scientific laboratory technician  . Financial resource strain: Not hard at all  . Food insecurity:    Worry: Never true    Inability: Never true  . Transportation needs:    Medical: No    Non-medical: No  Tobacco Use  . Smoking status:  Former Smoker    Types: Cigarettes    Last attempt to quit: 05/19/2013    Years since quitting: 4.6  . Smokeless tobacco: Never Used  . Tobacco comment: quit  Substance and Sexual Activity  . Alcohol use: No    Alcohol/week: 0.0 standard drinks  . Drug use: No  . Sexual activity: Not on file  Lifestyle  . Physical activity:    Days per week: 0 days    Minutes per session: 0 min  . Stress: To some extent  Relationships  . Social connections:    Talks on phone: Twice a  week    Gets together: Twice a week    Attends religious service: Never    Active member of club or organization: Yes    Attends meetings of clubs or organizations: Never    Relationship status: Married  . Intimate partner violence:    Fear of current or ex partner: No    Emotionally abused: No    Physically abused: No    Forced sexual activity: No  Other Topics Concern  . Not on file  Social History Narrative   Lives with wife    FAMILY HISTORY: Family History  Problem Relation Age of Onset  . Cancer Mother   . Kidney cancer Neg Hx   . Prostate cancer Neg Hx   . Kidney failure Neg Hx   . Bladder Cancer Neg Hx     ALLERGIES:  is allergic to heparin; ibuprofen; multivitamin [centrum]; daypro [oxaprozin]; and tape.  MEDICATIONS:  Current Facility-Administered Medications  Medication Dose Route Frequency Provider Last Rate Last Dose  . 0.9 %  sodium chloride infusion  10 mL/hr Intravenous Once Algernon Huxley, MD      . 0.9 %  sodium chloride infusion  100 mL Intravenous PRN Lucky Cowboy, Erskine Squibb, MD      . 0.9 %  sodium chloride infusion  100 mL Intravenous PRN Lucky Cowboy, Erskine Squibb, MD      . acetaminophen (TYLENOL) tablet 650 mg  650 mg Oral Q6H PRN Algernon Huxley, MD       Or  . acetaminophen (TYLENOL) suppository 650 mg  650 mg Rectal Q6H PRN Algernon Huxley, MD      . albuterol (PROVENTIL) (2.5 MG/3ML) 0.083% nebulizer solution 2.5 mg  2.5 mg Nebulization Q6H PRN Algernon Huxley, MD      . alteplase (CATHFLO ACTIVASE) injection 2 mg  2 mg Intracatheter Once PRN Algernon Huxley, MD      . bisacodyl (DULCOLAX) EC tablet 5 mg  5 mg Oral Daily PRN Algernon Huxley, MD      . bumetanide (BUMEX) injection 1 mg  1 mg Intravenous Once Algernon Huxley, MD      . Chlorhexidine Gluconate Cloth 2 % PADS 6 each  6 each Topical Q0600 Algernon Huxley, MD   Stopped at 12/31/17 1803  . dextrose 50 % solution 25 mL  25 mL Intravenous PRN Algernon Huxley, MD      . Difluprednate 0.05 % EMUL 1 drop  1 drop Left Eye BID Algernon Huxley,  MD   1 drop at 12/31/17 1758  . famotidine (PEPCID) IVPB 20 mg premix  20 mg Intravenous Q24H Algernon Huxley, MD      . feeding supplement (ENSURE ENLIVE) (ENSURE ENLIVE) liquid 237 mL  237 mL Oral Daily Dew, Erskine Squibb, MD      . gabapentin (NEURONTIN) capsule 100 mg  100  mg Oral TID Algernon Huxley, MD   100 mg at 12/31/17 1757  . insulin aspart (novoLOG) injection 0-15 Units  0-15 Units Subcutaneous TID WC Algernon Huxley, MD   3 Units at 12/31/17 1757  . insulin detemir (LEVEMIR) injection 10 Units  10 Units Subcutaneous Daily Algernon Huxley, MD   10 Units at 12/31/17 1219  . ipratropium-albuterol (DUONEB) 0.5-2.5 (3) MG/3ML nebulizer solution 3 mL  3 mL Nebulization Q6H Algernon Huxley, MD   3 mL at 12/31/17 1358  . ipratropium-albuterol (DUONEB) 0.5-2.5 (3) MG/3ML nebulizer solution 3 mL  3 mL Nebulization Q4H PRN Dew, Erskine Squibb, MD      . lidocaine (PF) (XYLOCAINE) 1 % injection 5 mL  5 mL Intradermal PRN Algernon Huxley, MD      . lidocaine-prilocaine (EMLA) cream 1 application  1 application Topical PRN Algernon Huxley, MD      . MEDLINE mouth rinse  15 mL Mouth Rinse BID Lucky Cowboy, Erskine Squibb, MD      . metoCLOPramide (REGLAN) tablet 5 mg  5 mg Oral TID AC Algernon Huxley, MD   5 mg at 12/31/17 1757  . mometasone-formoterol (DULERA) 200-5 MCG/ACT inhaler 2 puff  2 puff Inhalation BID Algernon Huxley, MD   Stopped at 12/31/17 1803  . multivitamin (RENA-VIT) tablet 1 tablet  1 tablet Oral QHS Algernon Huxley, MD      . ondansetron (ZOFRAN) tablet 4 mg  4 mg Oral Q6H PRN Algernon Huxley, MD       Or  . ondansetron (ZOFRAN) injection 4 mg  4 mg Intravenous Q6H PRN Dew, Erskine Squibb, MD      . pentafluoroprop-tetrafluoroeth (GEBAUERS) aerosol 1 application  1 application Topical PRN Dew, Erskine Squibb, MD      . polyvinyl alcohol (LIQUIFILM TEARS) 1.4 % ophthalmic solution 1 drop  1 drop Both Eyes QID PRN Algernon Huxley, MD      . pravastatin (PRAVACHOL) tablet 20 mg  20 mg Oral q1800 Algernon Huxley, MD   20 mg at 12/31/17 1757  . senna-docusate  (Senokot-S) tablet 1 tablet  1 tablet Oral QHS PRN Algernon Huxley, MD      . Derrill Memo ON 01/01/2018] torsemide [facility]) tablet 100 mg  100 mg Oral Once per day on Sun Tue Thu Sat Algernon Huxley, MD          .  PHYSICAL EXAMINATION:  Vitals:   12/31/17 1655 12/31/17 1658  BP: (!) 126/105 (!) 129/91  Pulse: 96 87  Resp: 17   Temp: 98 F (36.7 C)   SpO2: 95%    Filed Weights   12/30/17 2251 12/31/17 0915 12/31/17 1316  Weight: 165 lb (74.8 kg) 201 lb 15.1 oz (91.6 kg) 195 lb 1.7 oz (88.5 kg)    GENERAL: Well-nourished well-developed; Alert, no distress and comfortable.  Accompanied by his family. EYES: Positive pallor no icterus.   OROPHARYNX: no thrush or ulceration. NECK: supple, no masses felt LYMPH:  no palpable lymphadenopathy in the cervical, axillary or inguinal regions LUNGS: decreased breath sounds to auscultation at bases and  No wheeze or crackles HEART/CVS: regular rate & rhythm and no murmurs; No lower extremity edema ABDOMEN: abdomen soft, non-tender and normal bowel sounds Musculoskeletal:no cyanosis of digits and no clubbing  PSYCH: alert & oriented x 3 with fluent speech NEURO: no focal motor/sensory deficits SKIN: Multiple ecchymosis noted.  LABORATORY DATA:  I have reviewed the data as listed  Lab Results  Component Value Date   WBC 6.2 12/31/2017   HGB 7.4 (L) 12/31/2017   HCT 18.0 (L) 12/31/2017   MCV 98.8 12/31/2017   PLT 141 (L) 12/31/2017   Recent Labs    12/15/17 1748  12/21/17 1105 12/24/17 2122 12/31/17 0027  NA 139   < > 137 133* 135  K 5.3*   < > 3.9 4.7 6.6*  CL 101   < > 96* 93* 95*  CO2 27   < > 32 29 24  GLUCOSE 130*   < > 235* 428* 527*  BUN 39*   < > 31* 51* 89*  CREATININE 6.06*   < > 2.91* 4.13* 7.42*  CALCIUM 7.7*   < > 7.8* 7.8* 6.7*  GFRNONAA 8*   < > 20* 13* 6*  GFRAA 10*   < > 23* 15* 8*  PROT 6.2*  --   --  6.4* 5.6*  ALBUMIN 3.6   < > 3.3* 3.8 3.5  AST 25  --   --  17 20  ALT 26  --   --  23 24  ALKPHOS 72  --   --   82 76  BILITOT 1.0  --   --  1.1 1.0   < > = values in this interval not displayed.    RADIOGRAPHIC STUDIES: I have personally reviewed the radiological images as listed and agreed with the findings in the report. Dg Chest 2 View  Result Date: 12/24/2017 CLINICAL DATA:  Acute onset shortness of breath. EXAM: CHEST - 2 VIEW COMPARISON:  12/18/2017 and 12/15/2017 FINDINGS: Lungs are adequately inflated demonstrate a small right-sided pleural effusion likely with associated right basilar atelectasis unchanged. Interval improved left base opacification. Cardiomediastinal silhouette and remainder of the exam is unchanged. IMPRESSION: Stable small right pleural effusion likely with associated right basilar atelectasis. Near resolution of left base opacification. Electronically Signed   By: Marin Olp M.D.   On: 12/24/2017 21:43   Ct Head Wo Contrast  Result Date: 12/31/2017 CLINICAL DATA:  Confusion, combative. Headache. On Eliquis. History of lymphoma, end-stage renal disease on dialysis, hypertension, stroke. EXAM: CT HEAD WITHOUT CONTRAST TECHNIQUE: Contiguous axial images were obtained from the base of the skull through the vertex without intravenous contrast. COMPARISON:  CT HEAD October 15, 2013 FINDINGS: BRAIN: No intraparenchymal hemorrhage, mass effect nor midline shift. The ventricles and sulci are normal for age. Minimal supratentorial white matter hypodensities less than expected for patient's age, though non-specific are most compatible with chronic small vessel ischemic disease. No acute large vascular territory infarcts. No abnormal extra-axial fluid collections. Basal cisterns are patent. VASCULAR: Moderate to severe calcific atherosclerosis of the carotid siphons. SKULL: No skull fracture. No significant scalp soft tissue swelling. SINUSES/ORBITS: Trace paranasal sinus mucosal thickening. Mastoid air cells are well aerated.The included ocular globes and orbital contents are non-suspicious.  Status post bilateral ocular lens implants. OTHER: None. IMPRESSION: Normal noncontrast CT HEAD for age. Electronically Signed   By: Elon Alas M.D.   On: 12/31/2017 01:13   Nm Pulmonary Perf And Vent  Result Date: 12/25/2017 CLINICAL DATA:  Chest pain. EXAM: NUCLEAR MEDICINE VENTILATION - PERFUSION LUNG SCAN TECHNIQUE: Ventilation images were obtained in multiple projections using inhaled aerosol Tc-24m DTPA. Perfusion images were obtained in multiple projections after intravenous injection of Tc-23m-MAA. RADIOPHARMACEUTICALS:  34.4 mCi of Tc-75m DTPA aerosol inhalation and 4.3 mCi Tc6m-MAA IV COMPARISON:  Chest x-ray 12/24/2017. FINDINGS: No ventilation perfusion mismatches noted. Mild ventilatory defects and  perfusion defects are noted within the tori defects being larger. These are primarily in the bases and most likely related to previously identified basilar atelectasis and pleural effusions. IMPRESSION: Low probability pulmonary embolus. Electronically Signed   By: Marcello Moores  Register   On: 12/25/2017 11:58   US Venous Img Lower Bilateral  Result Date: 12/25/2017 CLINICAL DATA:  Bilateral lower extremity pain/cramping for 1 day with dyspnea. EXAM: BILATERAL LOWER EXTREMITY VENOUS DOPPLER ULTRASOUND TECHNIQUE: Gray-scale sonography with graded compression, as well as color Doppler and duplex ultrasound were performed to evaluate the lower extremity deep venous systems from the level of the common femoral vein and including the common femoral, femoral, profunda femoral, popliteal and calf veins including the posterior tibial, peroneal and gastrocnemius veins when visible. The superficial great saphenous vein was also interrogated. Spectral Doppler was utilized to evaluate flow at rest and with distal augmentation maneuvers in the common femoral, femoral and popliteal veins. COMPARISON:  Left lower extremity venous Doppler scan dated 01/15/2017 FINDINGS: RIGHT LOWER EXTREMITY Common Femoral Vein: No  evidence of thrombus. Normal compressibility, respiratory phasicity and response to augmentation. Saphenofemoral Junction: No evidence of thrombus. Normal compressibility and flow on color Doppler imaging. Profunda Femoral Vein: No evidence of thrombus. Normal compressibility and flow on color Doppler imaging. Femoral Vein: Nonocclusive thrombosis of the midportion of the right femoral vein. Patent compressible proximal and distal portions of the right femoral vein. Popliteal Vein: No evidence of thrombus. Normal compressibility, respiratory phasicity and response to augmentation. Calf Veins: No evidence of thrombus. Normal compressibility and flow on color Doppler imaging. Superficial Great Saphenous Vein: No evidence of thrombus. Normal compressibility. Venous Reflux:  None. Other Findings:  None. LEFT LOWER EXTREMITY Common Femoral Vein: No evidence of thrombus. Normal compressibility, respiratory phasicity and response to augmentation. Saphenofemoral Junction: No evidence of thrombus. Normal compressibility and flow on color Doppler imaging. Profunda Femoral Vein: No evidence of thrombus. Normal compressibility and flow on color Doppler imaging. Femoral Vein: No evidence of thrombus. Normal compressibility, respiratory phasicity and response to augmentation. Popliteal Vein: No evidence of thrombus. Normal compressibility, respiratory phasicity and response to augmentation. Calf Veins: No evidence of thrombus. Normal compressibility and flow on color Doppler imaging. Superficial Great Saphenous Vein: No evidence of thrombus. Normal compressibility. Venous Reflux:  None. Other Findings:  None. IMPRESSION: 1. Nonocclusive deep venous thrombosis of the midportion of the right femoral vein. 2. Otherwise patent deep veins of the lower extremities bilaterally. Electronically Signed   By: Ilona Sorrel M.D.   On: 12/25/2017 03:00   Dg Chest Port 1 View  Result Date: 12/30/2017 CLINICAL DATA:  Weakness. EXAM: PORTABLE  CHEST 1 VIEW COMPARISON:  Most recent radiograph 12/24/2017, 12/18/2017. Additional priors. FINDINGS: Low lung volumes. Increased pulmonary edema from prior exam. Small right pleural effusion is grossly unchanged. Streaky left lung base atelectasis. Mild cardiomegaly is similar to priors. No pneumothorax. IMPRESSION: Increased pulmonary edema from prior exam. Small right pleural effusion is similar. Electronically Signed   By: Keith Rake M.D.   On: 12/30/2017 23:50   Dg Chest Port 1 View  Result Date: 12/18/2017 CLINICAL DATA:  Shortness of breath. EXAM: PORTABLE CHEST 1 VIEW COMPARISON:  Radiograph of December 15, 2017. FINDINGS: The heart size and mediastinal contours are within normal limits. Stable bilateral pulmonary edema is noted. Mild bilateral pleural effusions are noted which are increased compared to prior exam. No pneumothorax is noted. Atherosclerosis of thoracic aorta is noted. The visualized skeletal structures are unremarkable. IMPRESSION: Bilateral pulmonary edema with  mild bilateral pleural effusions. Aortic Atherosclerosis (ICD10-I70.0). Electronically Signed   By: Marijo Conception, M.D.   On: 12/18/2017 15:10   Dg Chest Port 1 View  Result Date: 12/15/2017 CLINICAL DATA:  Altered mental status with cough. EXAM: PORTABLE CHEST 1 VIEW COMPARISON:  01/15/2017 and PET-CT 11/30/2016 FINDINGS: Lungs are adequately inflated and demonstrate hazy prominence of the perihilar markings likely interstitial edema. No effusion. Cardiomediastinal silhouette and remainder the exam is unchanged. IMPRESSION: Findings suggesting mild interstitial edema. Electronically Signed   By: Marin Olp M.D.   On: 12/15/2017 21:06    ASSESSMENT & PLAN:   #72 year old male patient with multiple medical problems including CLL/anemia-ESRD on dialysis secondary to the hospital for mental status changes-noted to have severe anemia.    #Severe anemia-hemoglobin 6.4; multifactorial-underlying CLL/end-stage  renal disease on hemodialysis.  Recommend continue EPO injections as per nephrology.  No evidence of obvious hemolysis on the recent work-up [haptoglobin normal; LDH only slightly elevated].  No obvious evidence of GI bleed.  Recommend transfusions to keep hemoglobin 7-8.  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  Currently on hold because of multiple medical issues/anemia.  Recent PET scan September 2019 [facility]-improving.  Continue to hold on ibrutinib.  #Right lower extremity nonocclusive DVT-status post IVC filter.  Currently off anticoagulation.  #Poor IV access-patient does not want to be stuck multiple times.  Long discussion the patient/daughter over the phone.  Discussed my concerns of risk of infection/blood clots.  Since patient was to continue current level of care-this will involve frequent hospitalizations; blood infusions; antibiotic infusion etc.-I think Mediport placement would be reasonable.  Discussed with Dr. Lucky Cowboy; likely cath port placed on October 2nd.   #Overall prognosis is poor-given his overall declining perform status/multiple hospitalizations.  However patient-family wants to continue current therapies.  Thank you Dr.Weiting for allowing me to participate in the care of your pleasant patient. Please do not hesitate to contact me with questions or concerns in the interim.  All questions were answered. The patient knows to call the clinic with any problems, questions or concerns.    Cammie Sickle, MD 12/31/2017 6:09 PM

## 2017-12-31 NOTE — Progress Notes (Signed)
Family Meeting Note  Advance Directive:Yes.  Today a meeting took place with: Patient and family (daughter + son).  Patientis able to participate: Yes.  The following clinical team members were present during this meeting: MD.  The following were discussed: Patient's diagnosis:Acute on chronic hypoxemic respiratory failure, volume overload, transfusion-dependent anemia, severe COPD, SLL/CLL, ESRD. Patient's progosis:Long-term prognosis is poor. Expected survival < 11mo. Recurrent hospitalizations (x4 total) in 12/2017 alone. Goals for treatment: DNR/DNI. Will manage current pathology as best as possible within these confines.  Additional follow-up to be provided:PRN.  Time spent during discussion:>20 minutes.  Arta Silence, MD

## 2018-01-01 ENCOUNTER — Other Ambulatory Visit: Payer: Self-pay

## 2018-01-01 ENCOUNTER — Encounter: Payer: Self-pay | Admitting: Vascular Surgery

## 2018-01-01 DIAGNOSIS — R4182 Altered mental status, unspecified: Secondary | ICD-10-CM

## 2018-01-01 DIAGNOSIS — Z515 Encounter for palliative care: Secondary | ICD-10-CM

## 2018-01-01 DIAGNOSIS — Z7189 Other specified counseling: Secondary | ICD-10-CM

## 2018-01-01 LAB — HEMOGLOBIN A1C

## 2018-01-01 LAB — PARATHYROID HORMONE, INTACT (NO CA)
PTH: 209 pg/mL — ABNORMAL HIGH (ref 15–65)
PTH: 233 pg/mL — AB (ref 15–65)

## 2018-01-01 LAB — PREPARE RBC (CROSSMATCH)

## 2018-01-01 LAB — GLUCOSE, CAPILLARY
GLUCOSE-CAPILLARY: 122 mg/dL — AB (ref 70–99)
Glucose-Capillary: 207 mg/dL — ABNORMAL HIGH (ref 70–99)
Glucose-Capillary: 203 mg/dL — ABNORMAL HIGH (ref 70–99)
Glucose-Capillary: 226 mg/dL — ABNORMAL HIGH (ref 70–99)

## 2018-01-01 LAB — VITAMIN D 25 HYDROXY (VIT D DEFICIENCY, FRACTURES): Vit D, 25-Hydroxy: 28.9 ng/mL — ABNORMAL LOW (ref 30.0–100.0)

## 2018-01-01 LAB — CALCIUM, IONIZED: CALCIUM, IONIZED, SERUM: 3.5 mg/dL — AB (ref 4.5–5.6)

## 2018-01-01 LAB — HEPATITIS B SURFACE ANTIGEN: HEP B S AG: NEGATIVE

## 2018-01-01 LAB — URINE CULTURE: CULTURE: NO GROWTH

## 2018-01-01 LAB — HEPATITIS B SURFACE ANTIBODY, QUANTITATIVE: Hepatitis B-Post: 3.9 m[IU]/mL — ABNORMAL LOW (ref >9.9)

## 2018-01-01 MED ORDER — ACETAMINOPHEN 325 MG PO TABS
650.0000 mg | ORAL_TABLET | Freq: Once | ORAL | Status: AC
  Administered 2018-01-01: 650 mg via ORAL
  Filled 2018-01-01: qty 2

## 2018-01-01 MED ORDER — IPRATROPIUM-ALBUTEROL 0.5-2.5 (3) MG/3ML IN SOLN
3.0000 mL | Freq: Three times a day (TID) | RESPIRATORY_TRACT | Status: DC
  Administered 2018-01-01 – 2018-01-06 (×14): 3 mL via RESPIRATORY_TRACT
  Filled 2018-01-01 (×15): qty 3

## 2018-01-01 MED ORDER — FUROSEMIDE 10 MG/ML IJ SOLN
120.0000 mg | Freq: Once | INTRAVENOUS | Status: AC
  Administered 2018-01-01: 120 mg via INTRAVENOUS
  Filled 2018-01-01: qty 12

## 2018-01-01 MED ORDER — SODIUM CHLORIDE 0.9% IV SOLUTION
Freq: Once | INTRAVENOUS | Status: AC
  Administered 2018-01-01: 12:00:00 via INTRAVENOUS

## 2018-01-01 NOTE — Consult Note (Signed)
Consultation Note Date: 01/01/2018   Patient Name: Mark [name].  DOB: [birth year]  MRN: [id number]  Age / Sex: 72 y.o., male  PCP: Albina Billet, MD Referring Physician: Loletha Grayer, MD  Reason for Consultation: Establishing goals of care  HPI/Patient Profile: 72 year old African-American male with a medical history as indicated below who presented to the ED via EMS with altered mental status and hyperglycemia.   Clinical Assessment and Goals of Care: Patient is not new to the palliative medicine team. Please see previous notes.  Patient is resting in bed. He states he has been in the hospital frequently, and has lost count of the visits, but it is not his fault. He states the hyperglycemia is because of the steroids he was taking from last hospitalization. He states that now that he has stopped the steroids, the glucose will come down to normal. He states that his bleeding and low hemaglobin is due to the Eliquis, and that the IVC filter now in place should have been used instead of Eliquis.    We discussed his goals of care.  Discussed limitations of medical interventions to prolong quality of life for this patient at this time in this situation and discussed the concept of human mortality.   Mark [name] states he was told previously that infection would kill him before cancer. His goal is to live long enough to see his children graduate college, and states they will not graduate for 3 years. He became tearful and stated he knows he will not live that long. He states when he can no longer have transfusions, he will make a decision for comfort. Until then he wants to fight to live. He tells me that the will to live can supercede medications.   We discussed directives. He states he would want to be "shocked" if he is alert and has a pulse (ex. Cardioversion, VT with pulse) as he states once in  dialysis he was having difficulty breathing but was awake and knew what was going on. He states they shocked him to change his heart rhythm. He does not want chest compressions or a breathing tube. He would not want to be intubated for respiratory distress. He would never want a feeding tube.         SUMMARY OF RECOMMENDATIONS    Recommend palliative to follow at D/C.   Code Status/Advance Care Planning:  Do not resuscitate in cardio/pumonary arrest. Do not intubate.     Symptom Management:   Per primary team.   Palliative Prophylaxis:   Eye Care and Oral Care  Prognosis:   Poor overall.   Discharge Planning: To Be Determined      Primary Diagnoses: Present on Admission: . Symptomatic anemia . Acute on chronic respiratory failure with hypoxemia ([place])   I have reviewed the medical record, interviewed the patient and family, and examined the patient. The following aspects are pertinent.  Past Medical History:  Diagnosis Date  . Anemia   . Arthritis   . Bladder cancer (  [place])   . Chronic kidney disease   . Diabetes mellitus without complication ([place])   . Dialysis patient [facility])    M,W,F  . Dyspnea    DOE  . GERD (gastroesophageal reflux disease)   . HOH (hard of hearing)   . Hypertension   . IBS (irritable bowel syndrome)   . Lymphoma ([place]) 09/27/2014  . Neuropathy    RIGHT LEG  . Stroke [facility])    TIA   Social History   Socioeconomic History  . Marital status: Married    Spouse name: Not on file  . Number of children: Not on file  . Years of education: Not on file  . Highest education level: Not on file  Occupational History  . Occupation: retired  Scientific laboratory technician  . Financial resource strain: Not hard at all  . Food insecurity:    Worry: Never true    Inability: Never true  . Transportation needs:    Medical: No    Non-medical: No  Tobacco Use  . Smoking status: Former Smoker    Types: Cigarettes    Last attempt to quit: 05/19/2013    Years since  quitting: 4.6  . Smokeless tobacco: Never Used  . Tobacco comment: quit  Substance and Sexual Activity  . Alcohol use: No    Alcohol/week: 0.0 standard drinks  . Drug use: No  . Sexual activity: Not on file  Lifestyle  . Physical activity:    Days per week: 0 days    Minutes per session: 0 min  . Stress: To some extent  Relationships  . Social connections:    Talks on phone: Twice a week    Gets together: Twice a week    Attends religious service: Never    Active member of club or organization: Yes    Attends meetings of clubs or organizations: Never    Relationship status: Married  Other Topics Concern  . Not on file  Social History Narrative   Lives with wife   Family History  Problem Relation Age of Onset  . Cancer Mother   . Kidney cancer Neg Hx   . Prostate cancer Neg Hx   . Kidney failure Neg Hx   . Bladder Cancer Neg Hx    Scheduled Meds: . bumetanide  1 mg Intravenous Once  . Chlorhexidine Gluconate Cloth  6 each Topical Q0600  . Difluprednate  1 drop Left Eye BID  . feeding supplement (ENSURE ENLIVE)  237 mL Oral Daily  . gabapentin  100 mg Oral TID  . insulin aspart  0-15 Units Subcutaneous TID WC  . insulin detemir  10 Units Subcutaneous Daily  . ipratropium-albuterol  3 mL Nebulization TID  . mouth rinse  15 mL Mouth Rinse BID  . metoCLOPramide  5 mg Oral TID AC  . mometasone-formoterol  2 puff Inhalation BID  . multivitamin  1 tablet Oral QHS  . pravastatin  20 mg Oral q1800  . torsemide  100 mg Oral Once per day on Sun Tue Thu Sat   Continuous Infusions: . sodium chloride    . sodium chloride    . sodium chloride    . famotidine (PEPCID) IV Stopped (01/01/18 0828)   PRN Meds:.sodium chloride, sodium chloride, acetaminophen **OR** acetaminophen, albuterol, alteplase, bisacodyl, dextrose, ipratropium-albuterol, lidocaine (PF), lidocaine-prilocaine, ondansetron **OR** ondansetron (ZOFRAN) IV, pentafluoroprop-tetrafluoroeth, polyvinyl alcohol,  senna-docusate Medications Prior to Admission:  Prior to Admission medications   Medication Sig Start Date End Date Taking? Authorizing Provider  albuterol (  PROVENTIL) (2.5 MG/3ML) 0.083% nebulizer solution Take 2.5 mg by nebulization every 6 (six) hours as needed for wheezing or shortness of breath.   Yes [provider]  apixaban (ELIQUIS) 5 MG TABS tablet Take 2 tablets (10 mg total) by mouth 2 (two) times daily for 5 days. 12/27/17 01/01/18 Yes Gladstone Lighter, MD  budesonide-formoterol (SYMBICORT) 160-4.5 MCG/ACT inhaler Inhale 2 puffs into the lungs 2 (two) times daily. 12/27/17  Yes Gladstone Lighter, MD  Difluprednate (DUREZOL) 0.05 % EMUL Place 1 drop into the left eye 2 (two) times daily.    Yes [provider]  docusate sodium (COLACE) 100 MG capsule Take 100 mg by mouth daily as needed for mild constipation.   Yes [provider]  ENSURE PLUS (ENSURE PLUS) LIQD Take 1 Can by mouth daily.  01/26/14  Yes [provider]  fluticasone (FLONASE) 50 MCG/ACT nasal spray Place 1 spray into both nostrils daily as needed for allergies.  12/14/14  Yes [provider]  gabapentin (NEURONTIN) 100 MG capsule Take 1 capsule (100 mg total) by mouth 3 (three) times daily. 03/02/16  Yes Edrick Kins, DPM  glimepiride (AMARYL) 4 MG tablet Take 4 mg by mouth at bedtime. Prn glucose levels 10/06/14  Yes [provider]  LEVEMIR FLEXTOUCH 100 UNIT/ML Pen Inject 5-6 Units into the skin daily at 10 pm.  08/21/14  Yes [provider]  lovastatin (MEVACOR) 20 MG tablet Take 1 tablet by mouth daily. 12/25/16  Yes [provider]  metoCLOPramide (REGLAN) 5 MG tablet Take 5 mg by mouth 3 (three) times daily before meals. Patient takes 1 tab with each meal.   Yes [provider]  multivitamin (RENA-VIT) TABS tablet Take 1 tablet by mouth at bedtime.    Yes [provider]  nystatin cream (MYCOSTATIN) Apply 1 application topically  2 (two) times daily as needed (irritation).    Yes [provider]  Polyvinyl Alcohol-Povidone PF (REFRESH) 1.4-0.6 % SOLN Apply 1 drop to eye 3 (three) times daily as needed (Dry eyes).   Yes [provider]  predniSONE (DELTASONE) 10 MG tablet Take 4 tab PO daily x 1 day and taper off by 10 every day 12/27/17  Yes Gladstone Lighter, MD  silver sulfADIAZINE (SILVADENE) 1 % cream APPLY 1 APPLICATION TOPICALY DAILY Patient taking differently: Apply 1 application topically daily as needed (sores).  04/12/17  Yes Edrick Kins, DPM  torsemide (DEMADEX) 100 MG tablet Take 1 tablet (100 mg total) by mouth daily. Please take on Non dialysis days- Tues, Thurs, Saturday and sunday 12/28/17  Yes Gladstone Lighter, MD  apixaban (ELIQUIS) 5 MG TABS tablet Take 1 tablet (5 mg total) by mouth 2 (two) times daily. Patient not taking: Reported on 12/30/2017 01/01/18   Gladstone Lighter, MD   Allergies  Allergen Reactions  . Heparin Nausea Only  . Ibuprofen Other (See Comments)    DUE TO DIALYSIS  . Multivitamin [Centrum] Other (See Comments)    DUE TO DIALYSIS  . Daypro [Oxaprozin] Swelling and Rash    Other reaction(s): Other (See Comments)  . Tape Rash   Review of Systems  Musculoskeletal:       Pain in his bottom from lying flat on his back.    Physical Exam  Constitutional: No distress.  Pulmonary/Chest: Effort normal.  Neurological: He is alert.  Oriented  Skin: Skin is warm and dry.    Vital Signs: BP (!) 126/55 (BP Location: Right Leg)   Pulse 68  Temp 97.7 F (36.5 C) (Oral)   Resp 16   Ht 5\' 11"  (1.803 m)   Wt 88.5 kg   SpO2 90%   BMI 27.21 kg/m  Pain Scale: 0-10 POSS *See Group Information*: 1-Acceptable,Awake and alert Pain Score: 3    SpO2: SpO2: 90 % O2 Device:SpO2: 90 % O2 Flow Rate: .O2 Flow Rate (L/min): 3 L/min  IO: Intake/output summary:   Intake/Output Summary (Last 24 hours) at 01/01/2018 1144 Last data filed at 01/01/2018 1133 Gross per 24  hour  Intake 584.12 ml  Output 2004 ml  Net -1419.88 ml    LBM: Last BM Date: 12/31/17 Baseline Weight: Weight: 74.8 kg Most recent weight: Weight: 88.5 kg     Palliative Assessment/Data:     Time In: 10:20 Time Out: 11:30 Time Total: 70 min Greater than 50%  of this time was spent counseling and coordinating care related to the above assessment and plan.  Signed by: Asencion Gowda, NP   Please contact Palliative Medicine Team phone at [phone] for questions and concerns.  For individual provider: See Shea Evans

## 2018-01-01 NOTE — Progress Notes (Signed)
PT Cancellation Note  Patient Details Name: Mark [name]. MRN: [id number] DOB: 16-Sep-1945   Cancelled Treatment:     PT order received and chart reviewed. Pt currently receiving blood transfusion and is unavailable for PT evaluation. Will re-attempt at a later time/date when pt is available.    Ernie Avena, SPT 01/01/2018, 2:27 PM

## 2018-01-01 NOTE — Progress Notes (Signed)
[facility]  ROUNDING NOTE   Subjective:  Patient seen at bedside. Due for another unit of blood today. He completed hemodialysis yesterday.  Objective:  Vital signs in last 24 hours:  Temp:  [97.3 F (36.3 C)-98.7 F (37.1 C)] 98.5 F (36.9 C) (10/01 1457) Pulse Rate:  [68-96] 80 (10/01 1457) Resp:  [13-20] 17 (10/01 1457) BP: (107-150)/(30-105) 134/59 (10/01 1457) SpO2:  [88 %-100 %] 94 % (10/01 1457)  Weight change: 16.8 kg Filed Weights   12/30/17 2251 12/31/17 0915 12/31/17 1316  Weight: 74.8 kg 91.6 kg 88.5 kg    Intake/Output: I/O last 3 completed shifts: In: 781.7 [P.O.:250; Blood:420; IV Piggyback:111.7] Out: 2004 [Other:2004]   Intake/Output this shift:  Total I/O In: 646.1 [P.O.:240; Blood:312; IV Piggyback:94.1] Out: 1 [Stool:1]  Physical Exam: General: No acute distress  Head: Normocephalic, atraumatic. Moist oral mucosal membranes  Eyes: Anicteric, left conjunctival erythema  Neck: Supple, trachea midline  Lungs:  Scattered rhonchi, normal effort  CV: S1S2 no rubs  Abdomen:  Soft, nontender, BS present  Extremities: No peripheral edema.  Neurologic: Awake, alert, following commands  Skin: No lesions  Access: Left forearm AVF    Basic Metabolic Panel: Recent Labs  Lab 12/31/17 0027 12/31/17 0436 12/31/17 0946  NA 135  --   --   K 6.6*  --   --   CL 95*  --   --   CO2 24  --   --   GLUCOSE 527*  --   --   BUN 89*  --   --   CREATININE 7.42*  --   --   CALCIUM 6.7*  --   --   MG  --  2.7*  --   PHOS  --  4.7* 4.4    Liver Function Tests: Recent Labs  Lab 12/31/17 0027  AST 20  ALT 24  ALKPHOS 76  BILITOT 1.0  PROT 5.6*  ALBUMIN 3.5   No results for input(s): LIPASE, AMYLASE in the last 168 hours. No results for input(s): AMMONIA in the last 168 hours.  CBC: Recent Labs  Lab 12/31/17 0027 12/31/17 1155  WBC 6.2  --   NEUTROABS 5.9  --   HGB 6.0* 7.4*  HCT 18.0*  --   MCV 98.8  --   PLT 141*  --      Cardiac Enzymes: Recent Labs  Lab 12/31/17 0027  TROPONINI 0.03*    BNP: Invalid input(s): POCBNP  CBG: Recent Labs  Lab 12/31/17 1233 12/31/17 1637 12/31/17 2148 01/01/18 0729 01/01/18 1135  GLUCAP 182* 166* 284* 122* 207*    Microbiology: Results for orders placed or performed during the hospital encounter of 12/30/17  Culture, blood (routine x 2)     Status: None (Preliminary result)   Collection Time: 12/31/17 12:30 AM  Result Value Ref Range Status   Specimen Description BLOOD RIGHT UPPER ARM  Final   Special Requests   Final    BOTTLES DRAWN AEROBIC AND ANAEROBIC Blood Culture adequate volume   Culture   Final    NO GROWTH 1 DAY Performed at [facility], [address], [place], [place] [address]    Report Status PENDING  Incomplete  Urine culture     Status: None   Collection Time: 12/31/17  1:13 AM  Result Value Ref Range Status   Specimen Description   Final    URINE, RANDOM Performed at [facility], [address], [place], [place] [address]  Special Requests   Final    NONE Performed at [facility], [address], [place], [place] [address]    Culture   Final    NO GROWTH Performed at [facility], [place] [address] [place], [place] [address]    Report Status 01/01/2018 FINAL  Final  Culture, blood (routine x 2)     Status: None (Preliminary result)   Collection Time: 12/31/17  2:34 AM  Result Value Ref Range Status   Specimen Description BLOOD RIGHT ANTECUBITAL  Final   Special Requests   Final    BOTTLES DRAWN AEROBIC AND ANAEROBIC Blood Culture results may not be optimal due to an excessive volume of blood received in culture bottles   Culture   Final    NO GROWTH 1 DAY Performed at [facility], [address] [place], [place] [address]    Report Status PENDING  Incomplete  MRSA PCR Screening     Status: None   Collection Time: 12/31/17  4:43 AM  Result Value Ref Range Status   MRSA  by PCR NEGATIVE NEGATIVE Final    Comment:        The GeneXpert MRSA Assay (FDA approved for NASAL specimens only), is one component of a comprehensive MRSA colonization surveillance program. It is not intended to diagnose MRSA infection nor to guide or monitor treatment for MRSA infections. Performed at [facility], [address] [place], [place] [address]     Coagulation Studies: No results for input(s): LABPROT, INR in the last 72 hours.  Urinalysis: Recent Labs    12/31/17 0113  COLORURINE AMBER*  LABSPEC 1.014  PHURINE 5.0  GLUCOSEU 50*  HGBUR MODERATE*  BILIRUBINUR NEGATIVE  KETONESUR NEGATIVE  PROTEINUR 100*  NITRITE NEGATIVE  LEUKOCYTESUR NEGATIVE      Imaging: Ct Head Wo Contrast  Result Date: 12/31/2017 CLINICAL DATA:  Confusion, combative. Headache. On Eliquis. History of lymphoma, end-stage renal disease on dialysis, hypertension, stroke. EXAM: CT HEAD WITHOUT CONTRAST TECHNIQUE: Contiguous axial images were obtained from the base of the skull through the vertex without intravenous contrast. COMPARISON:  CT HEAD October 15, 2013 FINDINGS: BRAIN: No intraparenchymal hemorrhage, mass effect nor midline shift. The ventricles and sulci are normal for age. Minimal supratentorial white matter hypodensities less than expected for patient's age, though non-specific are most compatible with chronic small vessel ischemic disease. No acute large vascular territory infarcts. No abnormal extra-axial fluid collections. Basal cisterns are patent. VASCULAR: Moderate to severe calcific atherosclerosis of the carotid siphons. SKULL: No skull fracture. No significant scalp soft tissue swelling. SINUSES/ORBITS: Trace paranasal sinus mucosal thickening. Mastoid air cells are well aerated.The included ocular globes and orbital contents are non-suspicious. Status post bilateral ocular lens implants. OTHER: None. IMPRESSION: Normal noncontrast CT HEAD for age. Electronically  Signed   By: Elon Alas M.D.   On: 12/31/2017 01:13   Dg Chest Port 1 View  Result Date: 12/30/2017 CLINICAL DATA:  Weakness. EXAM: PORTABLE CHEST 1 VIEW COMPARISON:  Most recent radiograph 12/24/2017, 12/18/2017. Additional priors. FINDINGS: Low lung volumes. Increased pulmonary edema from prior exam. Small right pleural effusion is grossly unchanged. Streaky left lung base atelectasis. Mild cardiomegaly is similar to priors. No pneumothorax. IMPRESSION: Increased pulmonary edema from prior exam. Small right pleural effusion is similar. Electronically Signed   By: Keith Rake M.D.   On: 12/30/2017 23:50     Medications:   . sodium chloride    . sodium chloride    .  sodium chloride    . famotidine (PEPCID) IV Stopped (01/01/18 6063)   . bumetanide  1 mg Intravenous Once  . Chlorhexidine Gluconate Cloth  6 each Topical Q0600  . Difluprednate  1 drop Left Eye BID  . feeding supplement (ENSURE ENLIVE)  237 mL Oral Daily  . gabapentin  100 mg Oral TID  . insulin aspart  0-15 Units Subcutaneous TID WC  . insulin detemir  10 Units Subcutaneous Daily  . ipratropium-albuterol  3 mL Nebulization TID  . mouth rinse  15 mL Mouth Rinse BID  . metoCLOPramide  5 mg Oral TID AC  . mometasone-formoterol  2 puff Inhalation BID  . multivitamin  1 tablet Oral QHS  . pravastatin  20 mg Oral q1800  . torsemide  100 mg Oral Once per day on Sun Tue Thu Sat   sodium chloride, sodium chloride, acetaminophen **OR** acetaminophen, albuterol, alteplase, bisacodyl, dextrose, ipratropium-albuterol, lidocaine (PF), lidocaine-prilocaine, ondansetron **OR** ondansetron (ZOFRAN) IV, pentafluoroprop-tetrafluoroeth, polyvinyl alcohol, senna-docusate  Assessment/ Plan:  [name]. is a 72 y.o. black male with end stage renal disease on hemodialysis, CLL, diabetes mellitus type II, COPD, history of bladder cancer who is admitted to [facility] on 12/30/2017 for shortness of breath.   [facility] M[facility]  [place] AVF 90.5kg  1.  ESRD on HD MWF. Patient due for dialysis again tomorrow. No acute indication for dialysis today. Agree with palliative care consultation as he's been admitted multiple times to the hospital recently.   2.  Anemia of chronic kidney disease.  atient undergoing another PRBC transfusion today.  We recommend transfusion of blood over 4 hours.  3.  Secondary hyperparathyroidism:  Serum phosphorous currently acceptable at 4.7.  Not currently on binders.  Continue to monitor bone mineral metabolism parameters.  4. Rt femoral DVT -Previously on Eliquis for this issue.  Now a GI bleed.  5.  Hyperkalemia.  Recommend repeating serum potassium tomorrow.  Wont allow blood draw today.    LOS: 1 Mark [name] 10/1/20193:19 PM

## 2018-01-01 NOTE — Progress Notes (Signed)
Patient ID: Mark [name]., male   DOB: [birth year], 72 y.o.   MRN: [id number]  [facility] PROGRESS NOTE  Mark [name]. VOJ:[id number] DOB: [birth year] DOA: 12/30/2017 PCP: Mark Billet, MD  HPI/Subjective: Patient feeling a little bit better with regards to his breathing.  Yesterday was short of breath and required dialysis.  Had procedure last night and having some bleeding from the right groin.  Patient refuses labs that are drawn on a daily basis.  Objective: Vitals:   01/01/18 1209 01/01/18 1348  BP: (!) 125/93 133/69  Pulse: 71 72  Resp: 18 14  Temp: 97.9 F (36.6 C) 98 F (36.7 C)  SpO2: 94% 94%    Filed Weights   12/30/17 2251 12/31/17 0915 12/31/17 1316  Weight: 74.8 kg 91.6 kg 88.5 kg    ROS: Review of Systems  Constitutional: Positive for malaise/fatigue. Negative for chills and fever.  Eyes: Negative for blurred vision.  Respiratory: Positive for cough and shortness of breath.   Cardiovascular: Negative for chest pain.  Gastrointestinal: Negative for abdominal pain, constipation, diarrhea, nausea and vomiting.  Genitourinary: Negative for dysuria.  Musculoskeletal: Negative for joint pain.  Neurological: Negative for dizziness and headaches.   Exam: Physical Exam  Constitutional: He is oriented to person, place, and time.  HENT:  Nose: No mucosal edema.  Mouth/Throat: No oropharyngeal exudate or posterior oropharyngeal edema.  Eyes: Pupils are equal, round, and reactive to light. Conjunctivae, EOM and lids are normal.  Neck: No JVD present. Carotid bruit is not present. No edema present. No thyroid mass and no thyromegaly present.  Cardiovascular: S1 normal and S2 normal. Exam reveals no gallop.  No murmur heard. Pulses:      Dorsalis pedis pulses are 2+ on the right side, and 2+ on the left side.  Respiratory: No respiratory distress. He has decreased breath sounds in the right lower field and the left lower field. He has no wheezes. He has  no rhonchi. He has no rales.  GI: Soft. Bowel sounds are normal. There is no tenderness.  Musculoskeletal:       Right ankle: He exhibits no swelling.       Left ankle: He exhibits no swelling.  Lymphadenopathy:    He has no cervical adenopathy.  Neurological: He is alert and oriented to person, place, and time. No cranial nerve deficit.  Skin: Skin is warm. No rash noted. Nails show no clubbing.  Psychiatric: He has a normal mood and affect.      Data Reviewed: Basic Metabolic Panel: Recent Labs  Lab 12/31/17 0027 12/31/17 0436 12/31/17 0946  NA 135  --   --   K 6.6*  --   --   CL 95*  --   --   CO2 24  --   --   GLUCOSE 527*  --   --   BUN 89*  --   --   CREATININE 7.42*  --   --   CALCIUM 6.7*  --   --   MG  --  2.7*  --   PHOS  --  4.7* 4.4   Liver Function Tests: Recent Labs  Lab 12/31/17 0027  AST 20  ALT 24  ALKPHOS 76  BILITOT 1.0  PROT 5.6*  ALBUMIN 3.5   CBC: Recent Labs  Lab 12/31/17 0027 12/31/17 1155  WBC 6.2  --   NEUTROABS 5.9  --   HGB 6.0* 7.4*  HCT 18.0*  --   MCV  98.8  --   PLT 141*  --    Cardiac Enzymes: Recent Labs  Lab 12/31/17 0027  TROPONINI 0.03*    CBG: Recent Labs  Lab 12/31/17 1233 12/31/17 1637 12/31/17 2148 01/01/18 0729 01/01/18 1135  GLUCAP 182* 166* 284* 122* 207*    Recent Results (from the past 240 hour(s))  MRSA PCR Screening     Status: None   Collection Time: 12/25/17  9:58 AM  Result Value Ref Range Status   MRSA by PCR NEGATIVE NEGATIVE Final    Comment:        The GeneXpert MRSA Assay (FDA approved for NASAL specimens only), is one component of a comprehensive MRSA colonization surveillance program. It is not intended to diagnose MRSA infection nor to guide or monitor treatment for MRSA infections. Performed at [facility], [address] [place], [place] [address]   Culture, blood (routine x 2)     Status: None (Preliminary result)   Collection Time: 12/31/17 12:30 AM   Result Value Ref Range Status   Specimen Description BLOOD RIGHT UPPER ARM  Final   Special Requests   Final    BOTTLES DRAWN AEROBIC AND ANAEROBIC Blood Culture adequate volume   Culture   Final    NO GROWTH 1 DAY Performed at [facility], [address], [place], [place] [address]    Report Status PENDING  Incomplete  Urine culture     Status: None   Collection Time: 12/31/17  1:13 AM  Result Value Ref Range Status   Specimen Description   Final    URINE, RANDOM Performed at [facility], [address], [place], [place] [address]    Special Requests   Final    NONE Performed at [facility], [address], [place], [place] [address]    Culture   Final    NO GROWTH Performed at [facility], [place] [address], [place], [place] [address]    Report Status 01/01/2018 FINAL  Final  Culture, blood (routine x 2)     Status: None (Preliminary result)   Collection Time: 12/31/17  2:34 AM  Result Value Ref Range Status   Specimen Description BLOOD RIGHT ANTECUBITAL  Final   Special Requests   Final    BOTTLES DRAWN AEROBIC AND ANAEROBIC Blood Culture results may not be optimal due to an excessive volume of blood received in culture bottles   Culture   Final    NO GROWTH 1 DAY Performed at [facility] [address], [place], [place] [address]    Report Status PENDING  Incomplete  MRSA PCR Screening     Status: None   Collection Time: 12/31/17  4:43 AM  Result Value Ref Range Status   MRSA by PCR NEGATIVE NEGATIVE Final    Comment:        The GeneXpert MRSA Assay (FDA approved for NASAL specimens only), is one component of a comprehensive MRSA colonization surveillance program. It is not intended to diagnose MRSA infection nor to guide or monitor treatment for MRSA infections. Performed at [facility], [address] [place], [place] [address]      Studies: Ct Head Wo Contrast  Result Date:  12/31/2017 CLINICAL DATA:  Confusion, combative. Headache. On Eliquis. History of lymphoma, end-stage renal disease on dialysis, hypertension, stroke. EXAM: CT HEAD WITHOUT CONTRAST TECHNIQUE: Contiguous axial images were obtained from the base of the skull through the vertex without intravenous contrast. COMPARISON:  CT  HEAD October 15, 2013 FINDINGS: BRAIN: No intraparenchymal hemorrhage, mass effect nor midline shift. The ventricles and sulci are normal for age. Minimal supratentorial white matter hypodensities less than expected for patient's age, though non-specific are most compatible with chronic small vessel ischemic disease. No acute large vascular territory infarcts. No abnormal extra-axial fluid collections. Basal cisterns are patent. VASCULAR: Moderate to severe calcific atherosclerosis of the carotid siphons. SKULL: No skull fracture. No significant scalp soft tissue swelling. SINUSES/ORBITS: Trace paranasal sinus mucosal thickening. Mastoid air cells are well aerated.The included ocular globes and orbital contents are non-suspicious. Status post bilateral ocular lens implants. OTHER: None. IMPRESSION: Normal noncontrast CT HEAD for age. Electronically Signed   By: Elon Alas M.D.   On: 12/31/2017 01:13   Dg Chest Port 1 View  Result Date: 12/30/2017 CLINICAL DATA:  Weakness. EXAM: PORTABLE CHEST 1 VIEW COMPARISON:  Most recent radiograph 12/24/2017, 12/18/2017. Additional priors. FINDINGS: Low lung volumes. Increased pulmonary edema from prior exam. Small right pleural effusion is grossly unchanged. Streaky left lung base atelectasis. Mild cardiomegaly is similar to priors. No pneumothorax. IMPRESSION: Increased pulmonary edema from prior exam. Small right pleural effusion is similar. Electronically Signed   By: Keith Rake M.D.   On: 12/30/2017 23:50    Scheduled Meds: . bumetanide  1 mg Intravenous Once  . Chlorhexidine Gluconate Cloth  6 each Topical Q0600  . Difluprednate  1 drop  Left Eye BID  . feeding supplement (ENSURE ENLIVE)  237 mL Oral Daily  . gabapentin  100 mg Oral TID  . insulin aspart  0-15 Units Subcutaneous TID WC  . insulin detemir  10 Units Subcutaneous Daily  . ipratropium-albuterol  3 mL Nebulization TID  . mouth rinse  15 mL Mouth Rinse BID  . metoCLOPramide  5 mg Oral TID AC  . mometasone-formoterol  2 puff Inhalation BID  . multivitamin  1 tablet Oral QHS  . pravastatin  20 mg Oral q1800  . torsemide  100 mg Oral Once per day on Sun Tue Thu Sat   Continuous Infusions: . sodium chloride    . sodium chloride    . sodium chloride    . famotidine (PEPCID) IV Stopped (01/01/18 1761)    Assessment/Plan:  1. Acute on chronic anemia.  Patient was given 1 unit of packed red blood cells on a hemoglobin of 6.0.  Hemoglobin did to 7.4.  Since he did have some bleeding overnight and refused labs this morning I will give another unit of packed red blood cells today.  The last time I saw him in the hospital I need to give him 3 units of blood also. 2. Hyperkalemia.  They did not do a repeat potassium with the dialysis.  Patient refused labs this morning.  Dialysis should have brought it down. 3. Recent diagnosis of DVT with transfusion dependent anemia.  Eliquis on hold.  IVC filter placed yesterday evening 4. Acute pulmonary edema and acute on chronic respiratory failure.  Currently on baseline 3 L 5. Type 2 diabetes mellitus on detemir insulin and sliding scale 6. History of bladder cancer and CLL. 7. Hyperlipidemia unspecified on pravastatin 8. End-stage renal disease on dialysis 9. History of stroke.  Unable to give any blood thinners 10. DNR already ordered 11. Weakness.  Physical therapy evaluation  Code Status:     Code Status Orders  (From admission, onward)         Start     Ordered   12/31/17 0441  Do not  attempt resuscitation (DNR)  Continuous    Question Answer Comment  In the event of cardiac or respiratory ARREST Do not call a  "code blue"   In the event of cardiac or respiratory ARREST Do not perform Intubation, CPR, defibrillation or ACLS   In the event of cardiac or respiratory ARREST Use medication by any route, position, wound care, and other measures to relive pain and suffering. May use oxygen, suction and manual treatment of airway obstruction as needed for comfort.      12/31/17 0440        Code Status History    Date Active Date Inactive Code Status Order ID Comments User Context   12/31/2017 0212 12/31/2017 0440 DNR [id number]  Arta Silence, MD ED   12/25/2017 0514 12/27/2017 1651 Partial Code [id number]  Harrie Foreman, MD ED   12/18/2017 1441 12/23/2017 1907 Partial Code [id number]  Asencion Gowda, NP Inpatient   01/15/2017 0423 01/18/2017 2120 Full Code [id number]  Saundra Shelling, MD Inpatient   10/20/2016 1627 10/22/2016 1817 Full Code [id number]  Lance Coon, MD Inpatient      Disposition Plan: Patient now interested in rehab  Consultants:  Nephrology  Vascular surgery  Oncology  Time spent: 25 minutes  [place]

## 2018-01-02 ENCOUNTER — Inpatient Hospital Stay
Admission: EM | Disposition: A | Discharge status: Skilled Nursing Facility | Payer: Self-pay | Source: Home / Self Care | Attending: Internal Medicine

## 2018-01-02 ENCOUNTER — Encounter: Payer: Self-pay | Admitting: Vascular Surgery

## 2018-01-02 DIAGNOSIS — Z992 Dependence on renal dialysis: Secondary | ICD-10-CM

## 2018-01-02 DIAGNOSIS — N186 End stage renal disease: Secondary | ICD-10-CM

## 2018-01-02 HISTORY — PX: PORTA CATH INSERTION: CATH118285

## 2018-01-02 LAB — GLUCOSE, CAPILLARY
GLUCOSE-CAPILLARY: 102 mg/dL — AB (ref 70–99)
GLUCOSE-CAPILLARY: 119 mg/dL — AB (ref 70–99)
GLUCOSE-CAPILLARY: 126 mg/dL — AB (ref 70–99)
Glucose-Capillary: 109 mg/dL — ABNORMAL HIGH (ref 70–99)
Glucose-Capillary: 111 mg/dL — ABNORMAL HIGH (ref 70–99)

## 2018-01-02 LAB — RENAL FUNCTION PANEL
Albumin: 3 g/dL — ABNORMAL LOW (ref 3.5–5.0)
Anion gap: 10 (ref 5–15)
BUN: 25 mg/dL — ABNORMAL HIGH (ref 8–23)
CO2: 32 mmol/L (ref 22–32)
CREATININE: 2.56 mg/dL — AB (ref 0.61–1.24)
Calcium: 7.3 mg/dL — ABNORMAL LOW (ref 8.9–10.3)
Chloride: 98 mmol/L (ref 98–111)
GFR calc non Af Amer: 23 mL/min — ABNORMAL LOW (ref >60)
GFR, EST AFRICAN AMERICAN: 27 mL/min — AB (ref >60)
Glucose, Bld: 110 mg/dL — ABNORMAL HIGH (ref 70–99)
PHOSPHORUS: 2.1 mg/dL — AB (ref 2.5–4.6)
Potassium: 3.1 mmol/L — ABNORMAL LOW (ref 3.5–5.1)
Sodium: 140 mmol/L (ref 135–145)

## 2018-01-02 LAB — CBC
HCT: 20.5 % — ABNORMAL LOW (ref 40.0–52.0)
Hemoglobin: 7.3 g/dL — ABNORMAL LOW (ref 13.0–18.0)
MCH: 32.7 pg (ref 26.0–34.0)
MCHC: 35.5 g/dL (ref 32.0–36.0)
MCV: 92.1 fL (ref 80.0–100.0)
PLATELETS: 104 10*3/uL — AB (ref 150–440)
RBC: 2.23 MIL/uL — AB (ref 4.40–5.90)
RDW: 16.7 % — ABNORMAL HIGH (ref 11.5–14.5)
WBC: 5.2 10*3/uL (ref 3.8–10.6)

## 2018-01-02 SURGERY — PORTA CATH INSERTION
Anesthesia: Moderate Sedation

## 2018-01-02 MED ORDER — MIDAZOLAM HCL 2 MG/2ML IJ SOLN
INTRAMUSCULAR | Status: DC | PRN
  Administered 2018-01-02: 0.5 mg via INTRAVENOUS
  Administered 2018-01-02: 1 mg via INTRAVENOUS
  Administered 2018-01-02: 2 mg via INTRAVENOUS

## 2018-01-02 MED ORDER — CEFAZOLIN SODIUM-DEXTROSE 2-4 GM/100ML-% IV SOLN: 2.0000 g | INTRAVENOUS | Status: DC

## 2018-01-02 MED ORDER — HEPARIN (PORCINE) IN NACL 1000-0.9 UT/500ML-% IV SOLN
INTRAVENOUS | Status: AC
  Filled 2018-01-02: qty 500

## 2018-01-02 MED ORDER — FENTANYL CITRATE (PF) 100 MCG/2ML IJ SOLN
INTRAMUSCULAR | Status: AC
  Filled 2018-01-02: qty 2

## 2018-01-02 MED ORDER — HEPARIN SODIUM (PORCINE) 1000 UNIT/ML IJ SOLN
INTRAMUSCULAR | Status: AC
  Filled 2018-01-02: qty 1

## 2018-01-02 MED ORDER — CEFAZOLIN SODIUM-DEXTROSE 2-4 GM/100ML-% IV SOLN: 2.0000 g | Freq: Once | INTRAVENOUS | Status: DC

## 2018-01-02 MED ORDER — FENTANYL CITRATE (PF) 100 MCG/2ML IJ SOLN
INTRAMUSCULAR | Status: DC | PRN
  Administered 2018-01-02: 50 ug via INTRAVENOUS
  Administered 2018-01-02: 25 ug via INTRAVENOUS
  Administered 2018-01-02: 50 ug via INTRAVENOUS

## 2018-01-02 MED ORDER — MIDAZOLAM HCL 5 MG/5ML IJ SOLN
INTRAMUSCULAR | Status: AC
  Filled 2018-01-02: qty 5

## 2018-01-02 MED ORDER — LIDOCAINE-EPINEPHRINE (PF) 1 %-1:200000 IJ SOLN
INTRAMUSCULAR | Status: AC
  Filled 2018-01-02: qty 30

## 2018-01-02 SURGICAL SUPPLY — 12 items
COVER PROBE U/S 5X48 (MISCELLANEOUS) ×3 IMPLANT
DERMABOND ADVANCED (GAUZE/BANDAGES/DRESSINGS) ×2
DERMABOND ADVANCED .7 DNX12 (GAUZE/BANDAGES/DRESSINGS) ×1 IMPLANT
DRAPE INCISE IOBAN 66X45 STRL (DRAPES) ×3 IMPLANT
KIT PORT POWER 8FR ISP CVUE (Port) ×3 IMPLANT
NEEDLE ENTRY 21GA 7CM ECHOTIP (NEEDLE) ×3 IMPLANT
PACK ANGIOGRAPHY (CUSTOM PROCEDURE TRAY) ×3 IMPLANT
PREP CHG 10.5 TEAL (MISCELLANEOUS) ×3 IMPLANT
SET INTRO CAPELLA COAXIAL (SET/KITS/TRAYS/PACK) ×3 IMPLANT
SUT MNCRL AB 4-0 PS2 18 (SUTURE) ×3 IMPLANT
SUT VICRYL+ 3-0 36IN CT-1 (SUTURE) ×3 IMPLANT
TOWEL OR 17X26 4PK STRL BLUE (TOWEL DISPOSABLE) ×3 IMPLANT

## 2018-01-02 NOTE — Progress Notes (Signed)
Dr. Leslye Peer in at bedside to see pt. Speaking with pt. Pt. Verbalized understanding.

## 2018-01-02 NOTE — H&P (Signed)
[facility] History & Physical Update  The patient was interviewed and re-examined.  The patient's previous History and Physical has been reviewed and is unchanged.  He has been requiring frequent blood draws but has become exceedingly challenging as a lab draw.  After lengthy discussion it was decided for placement of an Infuse-a-Port so that we he would have adequate venous access.  There is no change in the plan of care. We plan to proceed with the scheduled procedure.  Hortencia Pilar, MD  01/02/2018, 11:46 AM

## 2018-01-02 NOTE — Progress Notes (Signed)
Pre HD assessment    01/02/18 1709  Neurological  Level of Consciousness Alert  Orientation Level Oriented X4  Respiratory  Respiratory Pattern Regular;Tachypnea  Chest Assessment Chest expansion symmetrical  Cardiac  ECG Monitor Yes  Vascular  R Radial Pulse +2  L Radial Pulse +2  Edema Generalized;Right upper extremity;Left upper extremity  Integumentary  Integumentary (WDL) X  Skin Color Appropriate for ethnicity  Musculoskeletal  Musculoskeletal (WDL) X  Generalized Weakness Yes  Assistive Device None  GU Assessment  Genitourinary (WDL) X  Genitourinary Symptoms  (HD)  Psychosocial  Psychosocial (WDL) WDL

## 2018-01-02 NOTE — Progress Notes (Signed)
Post HD assessment    01/02/18 2103  Neurological  Level of Consciousness Responds to Voice  Orientation Level Oriented X4  Respiratory  Respiratory Pattern Regular;Tachypnea  Chest Assessment Chest expansion symmetrical  Cardiac  Pulse Irregular  ECG Monitor Yes  Vascular  R Radial Pulse +2  L Radial Pulse +2  Edema Generalized;Right upper extremity;Left upper extremity  Integumentary  Integumentary (WDL) X  Skin Color Appropriate for ethnicity  Musculoskeletal  Musculoskeletal (WDL) X  Generalized Weakness Yes  Assistive Device None  GU Assessment  Genitourinary (WDL) X  Genitourinary Symptoms  (HD)  Psychosocial  Psychosocial (WDL) WDL

## 2018-01-02 NOTE — Progress Notes (Signed)
Per MD okay for RN to resume previous diet.

## 2018-01-02 NOTE — Progress Notes (Signed)
Post HD assessment. Pt tolerated tx well without c/o or complication. Net UF 1009, goal met.    01/02/18 2104  Vital Signs  Temp 98.5 F (36.9 C)  Temp Source Oral  Pulse Rate 84  Pulse Rate Source Monitor  Resp (!) 25  BP (!) 139/50  BP Location Right Leg  BP Method Automatic  Patient Position (if appropriate) Lying  Oxygen Therapy  SpO2 98 %  O2 Device Nasal Cannula  O2 Flow Rate (L/min) 5 L/min  Dialysis Weight  Weight 92.7 kg  Type of Weight Post-Dialysis  Post-Hemodialysis Assessment  Rinseback Volume (mL) 250 mL  KECN 79.5 V  Dialyzer Clearance Lightly streaked  Duration of HD Treatment -hour(s) 3.5 hour(s)  Hemodialysis Intake (mL) 500 mL  UF Total -Machine (mL) 1509 mL  Net UF (mL) 1009 mL  Tolerated HD Treatment Yes  AVG/AVF Arterial Site Held (minutes) 10 minutes  AVG/AVF Venous Site Held (minutes) 10 minutes  Education / Care Plan  Dialysis Education Provided Yes  Documented Education in Care Plan Yes  Fistula / Graft Left Forearm Arteriovenous fistula  No Placement Date or Time found.   Placed prior to admission: No  Orientation: Left  Access Location: Forearm  Access Type: Arteriovenous fistula  Site Condition No complications  Fistula / Graft Assessment Present;Thrill;Bruit  Status Deaccessed  Drainage Description None

## 2018-01-02 NOTE — Op Note (Signed)
OPERATIVE NOTE   PROCEDURE: 1. Placement of a right IJ Infuse-a-Port  PRE-OPERATIVE DIAGNOSIS: Lack of venous access; end-stage renal disease on dialysis  POST-OPERATIVE DIAGNOSIS: Same  SURGEON: Katha Cabal M.D.  ANESTHESIA: Conscious sedation was administered under my direct supervision by the interventional radiology RN. IV Versed plus fentanyl were utilized. Continuous ECG, pulse oximetry and blood pressure was monitored throughout the entire procedure. Conscious sedation was for a total of 31 minutes.  ESTIMATED BLOOD LOSS: Minimal   FINDING(S): 1.  Patent vein  SPECIMEN(S): None  INDICATIONS:   Mark [name]. is a 72 y.o. male who presents with lack of appropriate venous access.  He has limited sites because he is already on dialysis.  Lately he has been requiring multiple parenteral treatments as well as frequent lab draws.  This has become an increasing problem and a port has been requested.  Risks and benefits of been reviewed all questions are answered patient agrees to proceed  DESCRIPTION: After obtaining full informed written consent, the patient was brought back to the special procedure suite and placed in the supine position. The patient's right neck and chest wall are prepped and draped in sterile fashion. Appropriate timeout was called.  Ultrasound is placed in a sterile sleeve, ultrasound is utilized to avoid vascular injury as well as secondary to lack of appropriate landmarks. The right internal jugular vein is identified. It is echolucent and homogeneous as well as easily compressible indicating patency. An image is recorded for the permanent record.  Access to the vein with a micropuncture needle is done under direct ultrasound visualization.  1% lidocaine is infiltrated into the soft tissue at the base of the neck as well as on the chest wall.  Under direct ultrasound visualization a micro-needle is inserted into the vein followed by the micro-wire.  Micro-sheath was then advanced and a J wire is inserted without difficulty under fluoroscopic guidance. A small counterincision was created at the wire insertion site. A transverse incision is created 2 fingerbreadths below the scapula and a pocket is fashioned using both blunt and sharp dissection. The pocket is tested for appropriate size with the hub of the Infuse-a-Port. The tunneling device is then used to pull the intravascular portion of the catheter from the pocket to the neck counterincision.  Dilator and peel-away sheath were then inserted over the wire and the wire is removed. Catheter is then advanced into the venous system without difficulty. Peel-away sheath was then removed.  Catheter is then positioned under fluoroscopic guidance at the atrial caval junction. It is then transected connected to the hub and the hope is slipped into the subcutaneous pocket on the chest wall. The hub was then accessed percutaneously and aspirates easily and flushes well and is flushed with 30 cc of heparinized saline. The pocket incision is then closed in layers using interrupted 3-0 Vicryl for the subcutaneous tissues and 4-0 Monocryl subcuticular for skin closure. Dermabond is applied. The neck counterincision was closed with 4-0 Monocryl subcuticular and Dermabond as well.  The patient tolerated the procedure well and there were no immediate complications.  COMPLICATIONS: None  CONDITION: Unchanged  Katha Cabal M.D. [facility] Office: [phone]   01/02/2018, 12:39 PM

## 2018-01-02 NOTE — Progress Notes (Signed)
[facility]  ROUNDING NOTE   Subjective:  Patient due for dialysis treatment today. Patient has not been allowing blood draw. We plan to repeat labs today during dialysis treatment.  Objective:  Vital signs in last 24 hours:  Temp:  [97.3 F (36.3 C)-98.7 F (37.1 C)] 98.1 F (36.7 C) (10/02 0947) Pulse Rate:  [68-90] 90 (10/02 0947) Resp:  [14-20] 20 (10/02 0947) BP: (107-153)/(46-93) 119/66 (10/02 0357) SpO2:  [87 %-97 %] 89 % (10/02 0947) FiO2 (%):  [36 %] 36 % (10/01 1950) Weight:  [88.5 kg] 88.5 kg (10/02 0947)  Weight change:  Filed Weights   12/31/17 0915 12/31/17 1316 01/02/18 0947  Weight: 91.6 kg 88.5 kg 88.5 kg    Intake/Output: I/O last 3 completed shifts: In: 646.1 [P.O.:240; Blood:312; IV Piggyback:94.1] Out: 1 [Stool:1]   Intake/Output this shift:  Total I/O In: 58.6 [IV Piggyback:58.6] Out: -   Physical Exam: General: No acute distress  Head: Normocephalic, atraumatic. Moist oral mucosal membranes  Eyes: Anicteric, left conjunctival erythema  Neck: Supple, trachea midline  Lungs:  Scattered rhonchi, normal effort  CV: S1S2 no rubs  Abdomen:  Soft, nontender, BS present  Extremities: No peripheral edema.  Neurologic: Awake, alert, following commands  Skin: No lesions  Access: Left forearm AVF    Basic Metabolic Panel: Recent Labs  Lab 12/31/17 0027 12/31/17 0436 12/31/17 0946  NA 135  --   --   K 6.6*  --   --   CL 95*  --   --   CO2 24  --   --   GLUCOSE 527*  --   --   BUN 89*  --   --   CREATININE 7.42*  --   --   CALCIUM 6.7*  --   --   MG  --  2.7*  --   PHOS  --  4.7* 4.4    Liver Function Tests: Recent Labs  Lab 12/31/17 0027  AST 20  ALT 24  ALKPHOS 76  BILITOT 1.0  PROT 5.6*  ALBUMIN 3.5   No results for input(s): LIPASE, AMYLASE in the last 168 hours. No results for input(s): AMMONIA in the last 168 hours.  CBC: Recent Labs  Lab 12/31/17 0027 12/31/17 1155  WBC 6.2  --   NEUTROABS 5.9  --    HGB 6.0* 7.4*  HCT 18.0*  --   MCV 98.8  --   PLT 141*  --     Cardiac Enzymes: Recent Labs  Lab 12/31/17 0027  TROPONINI 0.03*    BNP: Invalid input(s): POCBNP  CBG: Recent Labs  Lab 01/01/18 1135 01/01/18 1621 01/01/18 2128 01/02/18 0733 01/02/18 0947  GLUCAP 207* 203* 226* 102* 109*    Microbiology: Results for orders placed or performed during the hospital encounter of 12/30/17  Culture, blood (routine x 2)     Status: None (Preliminary result)   Collection Time: 12/31/17 12:30 AM  Result Value Ref Range Status   Specimen Description BLOOD RIGHT UPPER ARM  Final   Special Requests   Final    BOTTLES DRAWN AEROBIC AND ANAEROBIC Blood Culture adequate volume   Culture   Final    NO GROWTH 2 DAYS Performed at [facility], [address] [place], [place] [address]    Report Status PENDING  Incomplete  Urine culture     Status: None   Collection Time: 12/31/17  1:13 AM  Result Value Ref Range Status   Specimen Description  Final    URINE, RANDOM Performed at [facility], [address], [place], [place] [address]    Special Requests   Final    NONE Performed at [facility], [address], [place], [place] [address]    Culture   Final    NO GROWTH Performed at [facility], [place] [address], [place], [place] [address]    Report Status 01/01/2018 FINAL  Final  Culture, blood (routine x 2)     Status: None (Preliminary result)   Collection Time: 12/31/17  2:34 AM  Result Value Ref Range Status   Specimen Description BLOOD RIGHT ANTECUBITAL  Final   Special Requests   Final    BOTTLES DRAWN AEROBIC AND ANAEROBIC Blood Culture results may not be optimal due to an excessive volume of blood received in culture bottles   Culture   Final    NO GROWTH 2 DAYS Performed at [facility], [address], [place], [place] [address]    Report Status PENDING  Incomplete  MRSA PCR Screening     Status: None    Collection Time: 12/31/17  4:43 AM  Result Value Ref Range Status   MRSA by PCR NEGATIVE NEGATIVE Final    Comment:        The GeneXpert MRSA Assay (FDA approved for NASAL specimens only), is one component of a comprehensive MRSA colonization surveillance program. It is not intended to diagnose MRSA infection nor to guide or monitor treatment for MRSA infections. Performed at [facility], [address] [place], [place] [address]     Coagulation Studies: No results for input(s): LABPROT, INR in the last 72 hours.  Urinalysis: Recent Labs    12/31/17 0113  COLORURINE AMBER*  LABSPEC 1.014  PHURINE 5.0  GLUCOSEU 50*  HGBUR MODERATE*  BILIRUBINUR NEGATIVE  KETONESUR NEGATIVE  PROTEINUR 100*  NITRITE NEGATIVE  LEUKOCYTESUR NEGATIVE      Imaging: No results found.   Medications:   . [MAR Hold] sodium chloride    . [MAR Hold] sodium chloride    . [MAR Hold] sodium chloride    . [MAR Hold] famotidine (PEPCID) IV Stopped (01/02/18 0916)   . [MAR Hold] bumetanide  1 mg Intravenous Once  . [MAR Hold] Chlorhexidine Gluconate Cloth  6 each Topical Q0600  . [MAR Hold] Difluprednate  1 drop Left Eye BID  . [MAR Hold] feeding supplement (ENSURE ENLIVE)  237 mL Oral Daily  . [MAR Hold] gabapentin  100 mg Oral TID  . [MAR Hold] insulin aspart  0-15 Units Subcutaneous TID WC  . [MAR Hold] insulin detemir  10 Units Subcutaneous Daily  . [MAR Hold] ipratropium-albuterol  3 mL Nebulization TID  . [MAR Hold] mouth rinse  15 mL Mouth Rinse BID  . [MAR Hold] metoCLOPramide  5 mg Oral TID AC  . [MAR Hold] mometasone-formoterol  2 puff Inhalation BID  . [MAR Hold] multivitamin  1 tablet Oral QHS  . [MAR Hold] pravastatin  20 mg Oral q1800  . [MAR Hold] torsemide  100 mg Oral Once per day on Sun Tue Thu Sat   [facility] Hold] sodium chloride, [MAR Hold] sodium chloride, [MAR Hold] acetaminophen **OR** [MAR Hold] acetaminophen, [MAR Hold] albuterol, [MAR Hold] alteplase, [MAR  Hold] bisacodyl, [MAR Hold] dextrose, [MAR Hold] ipratropium-albuterol, [MAR Hold] lidocaine (PF), [MAR Hold] lidocaine-prilocaine, [MAR Hold] ondansetron **OR** [MAR Hold] ondansetron (ZOFRAN) IV, [MAR Hold] pentafluoroprop-tetrafluoroeth, [MAR Hold] polyvinyl alcohol, [MAR Hold] senna-docusate  Assessment/ Plan:  Mr. Mark  J Jettie [name]. is a 72 y.o. black male with end stage renal disease on hemodialysis, CLL, diabetes mellitus type II, COPD, history of bladder cancer who is admitted to [facility] on 12/30/2017 for shortness of breath.   [facility] MWF [facility] AVF 90.5kg  1.  ESRD on HD MWF. Patient due for hemodialysis today.  Orders have been prepared.  We will draw labs during dialysis treatment.  2.  Anemia of chronic kidney disease.  Recommend repeat CBC today.  We have ordered labs during dialysis treatment today.  3.  Secondary hyperparathyroidism: Recheck serum phosphorus today.  4. Rt femoral DVT -Previously on Eliquis for this issue.  Now a GI bleed.  5.  Hyperkalemia.  Most recent serum potassium was 6.6 but patient would not allow redraw previously.  Recheck serum potassium today.  LOS: 2 Geraldina Parrott 10/2/201910:40 AM

## 2018-01-02 NOTE — Progress Notes (Signed)
HD tx end    01/02/18 2056  Vital Signs  Pulse Rate 87  Pulse Rate Source Monitor  Resp 16  BP 95/78  BP Location Right Leg  BP Method Automatic  Patient Position (if appropriate) Lying  Oxygen Therapy  SpO2 97 %  O2 Device Nasal Cannula  O2 Flow Rate (L/min) 5 L/min  During Hemodialysis Assessment  Dialysis Fluid Bolus Normal Saline  Bolus Amount (mL) 250 mL  Intra-Hemodialysis Comments Tx completed

## 2018-01-02 NOTE — Progress Notes (Signed)
Patient ID: Mark [name]., male   DOB: January 13, 1946, 72 y.o.   MRN: [id number]   [employer] PROGRESS NOTE  Laretta Alstrom [name]. TJQ:[id number] DOB: 1945-07-16 DOA: 12/30/2017 PCP: Albina Billet, MD  HPI/Subjective: Patient feeling tired.  Seen after port placement.  Patient still having bleeding from the right groin.   Objective: Vitals:   01/02/18 1345 01/02/18 1430  BP: (!) 144/68 (!) 124/53  Pulse: 82 80  Resp: 19 20  Temp: 98.3 F (36.8 C) 99 F (37.2 C)  SpO2: 94%     Filed Weights   12/31/17 0915 12/31/17 1316 01/02/18 0947  Weight: 91.6 kg 88.5 kg 88.5 kg    ROS: Review of Systems  Constitutional: Positive for malaise/fatigue. Negative for chills and fever.  Eyes: Negative for blurred vision.  Respiratory: Positive for cough and shortness of breath.   Cardiovascular: Negative for chest pain.  Gastrointestinal: Negative for abdominal pain, constipation, diarrhea, nausea and vomiting.  Genitourinary: Negative for dysuria.  Musculoskeletal: Negative for joint pain.  Neurological: Negative for dizziness and headaches.   Exam: Physical Exam  Constitutional: He is oriented to person, place, and time.  HENT:  Nose: No mucosal edema.  Mouth/Throat: No oropharyngeal exudate or posterior oropharyngeal edema.  Eyes: Pupils are equal, round, and reactive to light. Conjunctivae, EOM and lids are normal.  Neck: No JVD present. Carotid bruit is not present. No edema present. No thyroid mass and no thyromegaly present.  Cardiovascular: S1 normal and S2 normal. Exam reveals no gallop.  No murmur heard. Pulses:      Dorsalis pedis pulses are 2+ on the right side, and 2+ on the left side.  Respiratory: No respiratory distress. He has decreased breath sounds in the right lower field and the left lower field. He has no wheezes. He has no rhonchi. He has no rales.  GI: Soft. Bowel sounds are normal. There is no tenderness.  Musculoskeletal:       Right ankle: He exhibits  no swelling.       Left ankle: He exhibits no swelling.  Lymphadenopathy:    He has no cervical adenopathy.  Neurological: He is alert and oriented to person, place, and time. No cranial nerve deficit.  Skin: Skin is warm. No rash noted. Nails show no clubbing.  Psychiatric: He has a normal mood and affect.      Data Reviewed: Basic Metabolic Panel: Recent Labs  Lab 12/31/17 0027 12/31/17 0436 12/31/17 0946  NA 135  --   --   K 6.6*  --   --   CL 95*  --   --   CO2 24  --   --   GLUCOSE 527*  --   --   BUN 89*  --   --   CREATININE 7.42*  --   --   CALCIUM 6.7*  --   --   MG  --  2.7*  --   PHOS  --  4.7* 4.4   Liver Function Tests: Recent Labs  Lab 12/31/17 0027  AST 20  ALT 24  ALKPHOS 76  BILITOT 1.0  PROT 5.6*  ALBUMIN 3.5   CBC: Recent Labs  Lab 12/31/17 0027 12/31/17 1155  WBC 6.2  --   NEUTROABS 5.9  --   HGB 6.0* 7.4*  HCT 18.0*  --   MCV 98.8  --   PLT 141*  --    Cardiac Enzymes: Recent Labs  Lab 12/31/17 0027  TROPONINI 0.03*  CBG: Recent Labs  Lab 01/01/18 1621 01/01/18 2128 01/02/18 0733 01/02/18 0947 01/02/18 1250  GLUCAP 203* 226* 102* 109* 119*    Recent Results (from the past 240 hour(s))  MRSA PCR Screening     Status: None   Collection Time: 12/25/17  9:58 AM  Result Value Ref Range Status   MRSA by PCR NEGATIVE NEGATIVE Final    Comment:        The GeneXpert MRSA Assay (FDA approved for NASAL specimens only), is one component of a comprehensive MRSA colonization surveillance program. It is not intended to diagnose MRSA infection nor to guide or monitor treatment for MRSA infections. Performed at [facility], [address] [place], [place] [address]   Culture, blood (routine x 2)     Status: None (Preliminary result)   Collection Time: 12/31/17 12:30 AM  Result Value Ref Range Status   Specimen Description BLOOD RIGHT UPPER ARM  Final   Special Requests   Final    BOTTLES DRAWN AEROBIC AND  ANAEROBIC Blood Culture adequate volume   Culture   Final    NO GROWTH 2 DAYS Performed at [facility], [address], [place], [place] [address]    Report Status PENDING  Incomplete  Urine culture     Status: None   Collection Time: 12/31/17  1:13 AM  Result Value Ref Range Status   Specimen Description   Final    URINE, RANDOM Performed at [facility], [address] [place], [place] [address]    Special Requests   Final    NONE Performed at [facility], [address] [place], [place] [address]    Culture   Final    NO GROWTH Performed at [facility], [place] [address], [place], [place] [address]    Report Status 01/01/2018 FINAL  Final  Culture, blood (routine x 2)     Status: None (Preliminary result)   Collection Time: 12/31/17  2:34 AM  Result Value Ref Range Status   Specimen Description BLOOD RIGHT ANTECUBITAL  Final   Special Requests   Final    BOTTLES DRAWN AEROBIC AND ANAEROBIC Blood Culture results may not be optimal due to an excessive volume of blood received in culture bottles   Culture   Final    NO GROWTH 2 DAYS Performed at [facility], [address], [place], [place] [address]    Report Status PENDING  Incomplete  MRSA PCR Screening     Status: None   Collection Time: 12/31/17  4:43 AM  Result Value Ref Range Status   MRSA by PCR NEGATIVE NEGATIVE Final    Comment:        The GeneXpert MRSA Assay (FDA approved for NASAL specimens only), is one component of a comprehensive MRSA colonization surveillance program. It is not intended to diagnose MRSA infection nor to guide or monitor treatment for MRSA infections. Performed at [facility], [address], [place], [place] [address]      Studies: No results found.  Scheduled Meds: . bumetanide  1 mg Intravenous Once  . Chlorhexidine Gluconate Cloth  6 each Topical Q0600  . Difluprednate  1 drop Left Eye BID  . feeding supplement  (ENSURE ENLIVE)  237 mL Oral Daily  . gabapentin  100 mg Oral TID  . insulin aspart  0-15 Units Subcutaneous TID WC  . insulin detemir  10 Units Subcutaneous Daily  . ipratropium-albuterol  3 mL Nebulization TID  .  mouth rinse  15 mL Mouth Rinse BID  . metoCLOPramide  5 mg Oral TID AC  . mometasone-formoterol  2 puff Inhalation BID  . multivitamin  1 tablet Oral QHS  . pravastatin  20 mg Oral q1800  . torsemide  100 mg Oral Once per day on Sun Tue Thu Sat   Continuous Infusions: . sodium chloride    . sodium chloride    . sodium chloride    . famotidine (PEPCID) IV Stopped (01/02/18 1914)    Assessment/Plan:  1. Acute on chronic anemia.  Patient was given 2 units of packed red blood cells on a hemoglobin of 6.0.  Patient refusing blood draws except with dialysis.  Dialysis for repeat hemoglobin.  With bleeding from the right groin it would not surprise me if his hemoglobin is still low.  May end up needing more blood depending on this evening's hemoglobin. 2. Hyperkalemia.  Repeat labs with dialysis today. 3. Recent diagnosis of DVT with transfusion dependent anemia.  Eliquis on hold.  IVC filter placed yesterday evening. 4. Acute pulmonary edema and acute on chronic respiratory failure. Currently on baseline 3 L.   5. Type 2 diabetes mellitus on detemir insulin and sliding scale 6. History of bladder cancer and CLL. 7. Hyperlipidemia unspecified on pravastatin 8. End-stage renal disease on dialysis 9. History of stroke.  Unable to give any blood thinners 10. DNR already ordered 11. Weakness.  Physical therapy evaluation  Code Status:     Code Status Orders  (From admission, onward)         Start     Ordered   12/31/17 0441  Do not attempt resuscitation (DNR)  Continuous    Question Answer Comment  In the event of cardiac or respiratory ARREST Do not call a "code blue"   In the event of cardiac or respiratory ARREST Do not perform Intubation, CPR, defibrillation or ACLS    In the event of cardiac or respiratory ARREST Use medication by any route, position, wound care, and other measures to relive pain and suffering. May use oxygen, suction and manual treatment of airway obstruction as needed for comfort.      12/31/17 0440        Code Status History    Date Active Date Inactive Code Status Order ID Comments User Context   12/31/2017 0212 12/31/2017 0440 DNR [id number]  Arta Silence, MD ED   12/25/2017 0514 12/27/2017 1651 Partial Code [id number]  Harrie Foreman, MD ED   12/18/2017 1441 12/23/2017 1907 Partial Code [id number]  Asencion Gowda, NP Inpatient   01/15/2017 0423 01/18/2017 2120 Full Code [id number]  Saundra Shelling, MD Inpatient   10/20/2016 1627 10/22/2016 1817 Full Code [id number]  Lance Coon, MD Inpatient      Disposition Plan: Patient now interested in rehab  Consultants:  Nephrology  Vascular surgery  Oncology  Time spent: 25 minutes  [place]

## 2018-01-02 NOTE — Progress Notes (Signed)
Pre HD assessment    01/02/18 1708  Vital Signs  Temp 98.5 F (36.9 C)  Temp Source Oral  Pulse Rate 81  Pulse Rate Source Monitor  Resp (!) 24  BP (!) 93/45  BP Location Right Leg  BP Method Automatic  Patient Position (if appropriate) Lying  Oxygen Therapy  SpO2 96 %  O2 Device Nasal Cannula  O2 Flow Rate (L/min) 5 L/min  Pain Assessment  Pain Scale 0-10  Pain Score 0  Dialysis Weight  Weight 93.5 kg  Type of Weight Pre-Dialysis  Time-Out for Hemodialysis  What Procedure? HD  Pt Identifiers(min of two) First/Last Name;MRN/Account#  Correct Site? Yes  Correct Side? Yes  Correct Procedure? Yes  Consents Verified? Yes  Rad Studies Available? N/A  Safety Precautions Reviewed? Yes  Engineer, civil (consulting) Number  (7A)  Station Number 4  UF/Alarm Test Passed  Conductivity: Meter 14  Conductivity: Machine  14  pH 7.4  Reverse Osmosis main  Normal Saline Lot Number [id number]  Dialyzer Lot Number [id number]  Disposable Set Lot Number [id number]  Machine Temperature 98.6 F (37 C)  Musician and Audible Yes  Blood Lines Intact and Secured Yes  Pre Treatment Patient Checks  Vascular access used during treatment Fistula  Hepatitis B Surface Antigen Results Negative  Date Hepatitis B Surface Antigen Drawn 12/31/17  Hepatitis B Surface Antibody  (<10)  Date Hepatitis B Surface Antibody Drawn 12/31/17  Hemodialysis Consent Verified Yes  Hemodialysis Standing Orders Initiated Yes  ECG (Telemetry) Monitor On Yes  Prime Ordered Normal Saline  Length of  DialysisTreatment -hour(s) 3.5 Hour(s)  Dialyzer Elisio 17H NR  Dialysate 2K, 2.5 Ca  Dialysis Anticoagulant None  Dialysate Flow Ordered 800  Blood Flow Rate Ordered 400 mL/min  Ultrafiltration Goal 1 Liters  Pre Treatment Labs Renal panel;CBC (post HD tx )  Dialysis Blood Pressure Support Ordered Normal Saline  Education / Care Plan  Dialysis Education Provided Yes  Documented Education in Care Plan Yes   Fistula / Graft Left Forearm Arteriovenous fistula  No Placement Date or Time found.   Placed prior to admission: No  Orientation: Left  Access Location: Forearm  Access Type: Arteriovenous fistula  Site Condition No complications  Fistula / Graft Assessment Present;Thrill;Bruit  Drainage Description None

## 2018-01-02 NOTE — Progress Notes (Signed)
Daily Progress Note   Patient Name: Mark [name].       Date: 01/02/2018 DOB: 18-Aug-1945  Age: 72 y.o. MRN#: [id number] Attending Physician: Loletha Grayer, MD Primary Care Physician: Albina Billet, MD Admit Date: 12/30/2017  Reason for Consultation/Follow-up: Psychosocial/spiritual support  Subjective: Patient is sitting up in bed eating dinner. Family at bedside, pastor at bedside. Patient had portacath placed today. No complaints noted from patient. Patient is tearful and states he wants to breathe outside air, and be able to look up in the sky and see God.   Daughter is requesting a home concentrator which goes up to 10 lpm as his only goes to 5 lpm.    Length of Stay: 2  Current Medications: Scheduled Meds:  . bumetanide  1 mg Intravenous Once  . Chlorhexidine Gluconate Cloth  6 each Topical Q0600  . Difluprednate  1 drop Left Eye BID  . feeding supplement (ENSURE ENLIVE)  237 mL Oral Daily  . gabapentin  100 mg Oral TID  . insulin aspart  0-15 Units Subcutaneous TID WC  . insulin detemir  10 Units Subcutaneous Daily  . ipratropium-albuterol  3 mL Nebulization TID  . mouth rinse  15 mL Mouth Rinse BID  . metoCLOPramide  5 mg Oral TID AC  . mometasone-formoterol  2 puff Inhalation BID  . multivitamin  1 tablet Oral QHS  . pravastatin  20 mg Oral q1800  . torsemide  100 mg Oral Once per day on Sun Tue Thu Sat    Continuous Infusions: . sodium chloride    . sodium chloride    . sodium chloride    . famotidine (PEPCID) IV Stopped (01/02/18 0916)    PRN Meds: sodium chloride, sodium chloride, acetaminophen **OR** acetaminophen, albuterol, alteplase, bisacodyl, dextrose, ipratropium-albuterol, lidocaine (PF), lidocaine-prilocaine, ondansetron **OR** ondansetron  (ZOFRAN) IV, pentafluoroprop-tetrafluoroeth, polyvinyl alcohol, senna-docusate  Physical Exam  Constitutional: No distress.  Pulmonary/Chest: Effort normal.  Neurological: He is alert.            Vital Signs: BP (!) 124/53 (BP Location: Right Arm)   Pulse 80   Temp 99 F (37.2 C) (Oral)   Resp 20   Ht 5\' 11"  (1.803 m)   Wt 88.5 kg   SpO2 94%   BMI 27.21 kg/m  SpO2: SpO2: 94 %  O2 Device: O2 Device: Nasal Cannula O2 Flow Rate: O2 Flow Rate (L/min): 4 L/min  Intake/output summary:   Intake/Output Summary (Last 24 hours) at 01/02/2018 1544 Last data filed at 01/02/2018 6720 Gross per 24 hour  Intake 58.61 ml  Output 0 ml  Net 58.61 ml   LBM: Last BM Date: 01/01/18 Baseline Weight: Weight: 74.8 kg Most recent weight: Weight: 88.5 kg       Palliative Assessment/Data:       Patient Active Problem List   Diagnosis Date Noted  . Symptomatic anemia 12/31/2017  . Acute on chronic respiratory failure with hypoxemia ([place]) 12/31/2017  . Hypoxia 12/25/2017  . Pressure injury of skin 12/16/2017  . Fluid overload 01/15/2017  . UTI (urinary tract infection) 10/20/2016  . Sepsis ([place]) 10/20/2016  . 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 Ulcer of foot, chronic ([place]) 03/21/2015  . History of bladder cancer 11/10/2014  . Penile bleeding 10/12/2014  . Dependence on renal dialysis ([place]) 08/18/2013  . Arterial blood pressure decreased 08/18/2013  . Leukemia ([place]) 08/18/2013  . Retina disorder 08/18/2013  . Breath shortness 08/18/2013  . End-stage renal disease on hemodialysis ([place]) 12/23/2012  . Absolute anemia 07/18/2012  . HPTH (hyperparathyroidism) ([place]) 07/18/2012  . Acidosis, metabolic 94/70/9628  . Hyperparathyroidism ([place]) 07/18/2012  . Abnormal presence of protein in urine 01/01/2012  . HLD (hyperlipidemia) 12/25/2011  . Diabetic retinopathy associated with type 2 diabetes mellitus ([place])  10/26/1999  . Essential (primary) hypertension 04/04/1999  . Diabetes mellitus type 2, uncontrolled ([place]) 04/04/1999  . Type 2 diabetes mellitus with other diabetic kidney complication ([place]) 36/62/9476    Palliative Care Assessment & Plan   Recommendations/Plan:  Continue current care.     Code Status:    Code Status Orders  (From admission, onward)         Start     Ordered   12/31/17 0441  Do not attempt resuscitation (DNR)  Continuous    Question Answer Comment  In the event of cardiac or respiratory ARREST Do not call a "code blue"   In the event of cardiac or respiratory ARREST Do not perform Intubation, CPR, defibrillation or ACLS   In the event of cardiac or respiratory ARREST Use medication by any route, position, wound care, and other measures to relive pain and suffering. May use oxygen, suction and manual treatment of airway obstruction as needed for comfort.      12/31/17 0440        Code Status History    Date Active Date Inactive Code Status Order ID Comments User Context   12/31/2017 0212 12/31/2017 0440 DNR [id number]  Arta Silence, MD ED   12/25/2017 0514 12/27/2017 1651 Partial Code [id number]  Harrie Foreman, MD ED   12/18/2017 1441 12/23/2017 1907 Partial Code [id number]  Asencion Gowda, NP Inpatient   01/15/2017 0423 01/18/2017 2120 Full Code [id number]  Saundra Shelling, MD Inpatient   10/20/2016 1627 10/22/2016 1817 Full Code [id number]  Lance Coon, MD Inpatient    Advance Directive Documentation     Most Recent Value  Type of Advance Directive  Healthcare Power of Attorney  Pre-existing out of facility DNR order (yellow form or pink MOST form)  -  "MOST" Form in Place?  -       Prognosis:   Unable to determine  Discharge Planning:  To Be Determined   Thank you for allowing the Palliative Medicine Team to  assist in the care of this patient.   Total Time 15 min Prolonged Time Billed  NO      Greater than 50%  of this time was  spent counseling and coordinating care related to the above assessment and plan.  Asencion Gowda, NP  Please contact Palliative Medicine Team phone at [phone] for questions and concerns.

## 2018-01-02 NOTE — Progress Notes (Signed)
PT Cancellation Note  Patient Details Name: Mark [name]. MRN: [id number] DOB: [birth year]   Cancelled Treatment:    Reason Eval/Treat Not Completed: Other (comment). Upon arrival to room, pt unavailable secondary to going to specials for port placement. Of note, pt also has orders for HD this date. Will most likely hold until tomorrow due to scheduling. Will re-attempt.   Jocelyn Lowery 01/02/2018, 10:23 AM  Greggory Stallion, PT, DPT [phone]

## 2018-01-02 NOTE — Progress Notes (Signed)
HD tx start    01/02/18 1720  Vital Signs  Pulse Rate 82  Pulse Rate Source Monitor  Resp 17  BP 109/90  BP Location Right Leg  BP Method Automatic  Patient Position (if appropriate) Lying  Oxygen Therapy  SpO2 92 %  O2 Device Nasal Cannula  O2 Flow Rate (L/min) 5 L/min  During Hemodialysis Assessment  Blood Flow Rate (mL/min) 400 mL/min  Arterial Pressure (mmHg) -150 mmHg  Venous Pressure (mmHg) 140 mmHg  Transmembrane Pressure (mmHg) 70 mmHg  Ultrafiltration Rate (mL/min) 410 mL/min  Dialysate Flow Rate (mL/min) 600 ml/min  Conductivity: Machine  14  HD Safety Checks Performed Yes  Dialysis Fluid Bolus Normal Saline  Bolus Amount (mL) 250 mL  Intra-Hemodialysis Comments Tx initiated  Fistula / Graft Left Forearm Arteriovenous fistula  No Placement Date or Time found.   Placed prior to admission: No  Orientation: Left  Access Location: Forearm  Access Type: Arteriovenous fistula  Status Accessed  Needle Size 15

## 2018-01-02 NOTE — Progress Notes (Signed)
Pt. Received into [facility] recovery. Pt. Easily desats., stating "my sats are all over the place: I have to have my Oxygen anywhere from 4 to 6 liters depending on how nervous I get. " With speaking , sats drop to 79-80%. When O2 up to 6L/[place], O2 sat. Up to 92-95%. Noted PAD intact to Right femoral site: pt. States "they just changed it 3x last night." Pt. States "Heparin makes me bleed too much."

## 2018-01-03 LAB — GLUCOSE, CAPILLARY
Glucose-Capillary: 198 mg/dL — ABNORMAL HIGH (ref 70–99)
Glucose-Capillary: 240 mg/dL — ABNORMAL HIGH (ref 70–99)
Glucose-Capillary: 242 mg/dL — ABNORMAL HIGH (ref 70–99)
Glucose-Capillary: 196 mg/dL — ABNORMAL HIGH (ref 70–99)

## 2018-01-03 LAB — HEMOGLOBIN AND HEMATOCRIT, BLOOD
HCT: 20.5 % — ABNORMAL LOW (ref 40.0–52.0)
HEMOGLOBIN: 7.2 g/dL — AB (ref 13.0–18.0)

## 2018-01-03 LAB — PREPARE RBC (CROSSMATCH)

## 2018-01-03 MED ORDER — ACETAMINOPHEN 325 MG PO TABS
650.0000 mg | ORAL_TABLET | Freq: Once | ORAL | Status: AC
  Administered 2018-01-03: 650 mg via ORAL
  Filled 2018-01-03: qty 2

## 2018-01-03 MED ORDER — MIDODRINE HCL 5 MG PO TABS
10.0000 mg | ORAL_TABLET | ORAL | Status: DC | PRN
  Administered 2018-01-07: 10:00:00 10 mg via ORAL
  Filled 2018-01-03 (×3): qty 2

## 2018-01-03 MED ORDER — SODIUM CHLORIDE 0.9% IV SOLUTION
Freq: Once | INTRAVENOUS | Status: AC
  Administered 2018-01-03: 10:00:00 via INTRAVENOUS

## 2018-01-03 MED ORDER — FUROSEMIDE 10 MG/ML IJ SOLN
120.0000 mg | Freq: Once | INTRAVENOUS | Status: AC
  Administered 2018-01-03: 120 mg via INTRAVENOUS
  Filled 2018-01-03: qty 12

## 2018-01-03 MED ORDER — SODIUM CHLORIDE 0.9% FLUSH
10.0000 mL | INTRAVENOUS | Status: DC | PRN
  Administered 2018-01-06: 10 mL
  Filled 2018-01-03: qty 40

## 2018-01-03 NOTE — Evaluation (Signed)
Physical Therapy Evaluation Patient Details Name: Mark [name]. MRN: [id number] DOB: 23-Mar-1946 Today's Date: 01/03/2018   History of Present Illness  Pt is a 72 year old African-American male with a medical history that includes anemia, bladder CA, CKD on HD, DM, DOE, and HTN presented to the ED via EMS with altered mental status and hyperglycemia.  Per records, family initially felt that patient may have a UTI as patient was confused, and weak.  Family also indicated that he has been delusional and agitated at times.  At the ED, patient's labs showed hyperglycemia with a blood glucose level of 527 mg/dL, potassium of 6.6 and a hemoglobin of 6.0 down from 9.1 on 12/24/17. His CXR showed pulmonary edema and a small right pleural effusion.   He was admitted to the ICU for management of hyperosmolar hyperglycemic state and acute blood loss anemia likely due to anticoagulation.  Assessment includes: acute on chronic anemia, hyperkalemia, recent Dx of DVT with transfusion dependent anemia, acute pulmonary edema, acute on chronic respiratory failure, ESRD on HD, weakness, s/p placement of an IVC filter to the R femoral vein, and s/p placement of R IJ infuse-a-port.     Clinical Impression  Pt presents with deficits in strength, transfers, mobility, gait, balance, and activity tolerance.  Pt required extra time and effort with bed mobility tasks and transfers but no physical assistance.  Pt was generally unsteady in standing and required significant BUE support on the RW to maintain stability.  Pt was only able to take several small steps at the EOB before fatiguing and returning to sitting.  Pt's SpO2 on 6LO2/min dropped from a baseline of 96% to 85% after amb and upon returning to sitting with PLB SpO2 returned to >/= 92% after 30-45 sec.  Overall pt presents with a significant decline in functional mobility, strength, and activity tolerance compared to his stated baseline levels.  Pt will benefit from PT  services in a SNF setting upon discharge to safely address above deficits for decreased caregiver assistance and eventual return to PLOF.      Follow Up Recommendations SNF    Equipment Recommendations  None recommended by PT    Recommendations for Other Services       Precautions / Restrictions Precautions Precautions: Fall Required Braces or Orthoses: Other Brace/Splint(Has a RLE AFO for foot drop which is chronic) Restrictions Weight Bearing Restrictions: No      Mobility  Bed Mobility Overal bed mobility: Modified Independent             General bed mobility comments: Pt requires increased time, HOB elevated, and use of bed rails.   Transfers Overall transfer level: Needs assistance Equipment used: Rolling walker (2 wheeled) Transfers: Sit to/from Stand Sit to Stand: From elevated surface;Min guard         General transfer comment: Min verbal cues for proper sequencing during transfers  Ambulation/Gait Ambulation/Gait assistance: Min guard Gait Distance (Feet): 3 Feet Assistive device: Rolling walker (2 wheeled) Gait Pattern/deviations: Step-to pattern;Trunk flexed;Decreased step length - right;Decreased step length - left Gait velocity: decreased   General Gait Details: Effortful, short steps at the EOB with flexed trunk posture and heavy reliance on the BUEs/RW for stability.  Pt fatigued quickly during amb with SpO2 dropping to 85% after several steps.  SpO2 returned to >/= 92% in less than 30 sec of sitting with PLB.    Stairs            Wheelchair Mobility    Modified  [place] (Stroke Patients Only)       Balance Overall balance assessment: Needs assistance Sitting-balance support: No upper extremity supported Sitting balance-Leahy Scale: Good     Standing balance support: Bilateral upper extremity supported Standing balance-Leahy Scale: Fair Standing balance comment: Heavy reliance on the RW for support in standing                              Pertinent Vitals/Pain Pain Assessment: No/denies pain    Home Living Family/patient expects to be discharged to:: Private residence Living Arrangements: Spouse/significant other Available Help at Discharge: Family Type of Home: House Home Access: Ramped entrance     Home Layout: One level Home Equipment: Environmental consultant - 4 wheels;Wheelchair - Banker;Shower seat;Grab bars - tub/shower;Grab bars - toilet;Hand held shower head;Cane - quad;Cane - single point;Walker - 2 wheels;Bedside commode      Prior Function Level of Independence: Independent with assistive device(s)   Gait / Transfers Assistance Needed: Mod Ind amb with a rollator household distances, no fall history, uses a w/c for community access  ADL's / Homemaking Assistance Needed: Ind with ADLs        Hand Dominance   Dominant Hand: Right    Extremity/Trunk Assessment   Upper Extremity Assessment Upper Extremity Assessment: Overall WFL for tasks assessed    Lower Extremity Assessment Lower Extremity Assessment: Generalized weakness RLE Deficits / Details: Pt with chronic R foot drop.        Communication   Communication: No difficulties  Cognition Arousal/Alertness: Awake/alert Behavior During Therapy: WFL for tasks assessed/performed Overall Cognitive Status: Within Functional Limits for tasks assessed                                        General Comments      Exercises Total Joint Exercises Ankle Circles/Pumps: AROM;Left;10 reps Short Arc Quad: AROM;Both;5 reps Heel Slides: AROM;Both;5 reps Long Arc Quad: AROM;Both;10 reps Knee Flexion: AROM;Both;10 reps Other Exercises Other Exercises: PLB education   Assessment/Plan    PT Assessment Patient needs continued PT services  PT Problem List Decreased strength;Decreased mobility;Decreased activity tolerance;Decreased balance;Cardiopulmonary status limiting activity       PT Treatment  Interventions Therapeutic activities;Gait training;Therapeutic exercise;Functional mobility training;Patient/family education;DME instruction;Balance training    PT Goals (Current goals can be found in the Care Plan section)  Acute Rehab PT Goals Patient Stated Goal: To "walk better and get to where I need to go" PT Goal Formulation: With patient Time For Goal Achievement: 01/16/18 Potential to Achieve Goals: Good    Frequency Min 2X/week   Barriers to discharge Inaccessible home environment;Decreased caregiver support      Co-evaluation               AM-PAC PT "6 Clicks" Daily Activity  Outcome Measure Difficulty turning over in bed (including adjusting bedclothes, sheets and blankets)?: A Little Difficulty moving from lying on back to sitting on the side of the bed? : A Little Difficulty sitting down on and standing up from a chair with arms (e.g., wheelchair, bedside commode, etc,.)?: Unable Help needed moving to and from a bed to chair (including a wheelchair)?: A Little Help needed walking in hospital room?: A Lot Help needed climbing 3-5 steps with a railing? : Total 6 Click Score: 13    End of Session Equipment Utilized During Treatment: Gait  belt;Oxygen Activity Tolerance: Patient limited by fatigue Patient left: in bed;with call bell/phone within reach;with bed alarm set;with family/visitor present;Other (comment)(Sitting at the EOB with family present per pt request) Nurse Communication: Mobility status;Other (comment)(Pt sitting at EOB at end of session per pt request with family visiting, RN notified) PT Visit Diagnosis: Unsteadiness on feet (R26.81);Muscle weakness (generalized) (M62.81);Difficulty in walking, not elsewhere classified (R26.2)    Time: 2449-7530 PT Time Calculation (min) (ACUTE ONLY): 37 min   Charges:   PT Evaluation $PT Eval Low Complexity: 1 Low PT Treatments $Therapeutic Exercise: 8-22 mins        D. Royetta Asal PT, DPT 01/03/18,  4:45 PM

## 2018-01-03 NOTE — Progress Notes (Signed)
Patient ID: Mark Skeeter., male   DOB: [birth year], 72 y.o.   MRN: [id number]     [facility] PROGRESS NOTE  Mark Alstrom [name]. MCN:[id number] DOB: 10/13/1945 DOA: 12/30/2017 PCP: Albina Billet, MD  HPI/Subjective: Patient seen this morning and still had a pressure dressing on his right groin.  I asked the nurse to try to take this off to see if this is still bleeding.  Patient still little short of breath.  Still feeling tired.    Objective: Vitals:   01/03/18 1149 01/03/18 1528  BP: 93/78 (!) 162/95  Pulse: 96 86  Resp: 18 20  Temp: 99.1 F (37.3 C) 98.6 F (37 C)  SpO2: 95% 91%    Filed Weights   01/02/18 1708 01/02/18 2104 01/03/18 0428  Weight: 93.5 kg 92.7 kg 92.1 kg    ROS: Review of Systems  Constitutional: Positive for malaise/fatigue. Negative for chills and fever.  Eyes: Negative for blurred vision.  Respiratory: Positive for cough and shortness of breath.   Cardiovascular: Negative for chest pain.  Gastrointestinal: Negative for abdominal pain, constipation, diarrhea, nausea and vomiting.  Genitourinary: Negative for dysuria.  Musculoskeletal: Negative for joint pain.  Neurological: Negative for dizziness and headaches.   Exam: Physical Exam  Constitutional: He is oriented to person, place, and time.  HENT:  Nose: No mucosal edema.  Mouth/Throat: No oropharyngeal exudate or posterior oropharyngeal edema.  Eyes: Pupils are equal, round, and reactive to light. Conjunctivae, EOM and lids are normal.  Neck: No JVD present. Carotid bruit is not present. No edema present. No thyroid mass and no thyromegaly present.  Cardiovascular: S1 normal and S2 normal. Exam reveals no gallop.  No murmur heard. Pulses:      Dorsalis pedis pulses are 2+ on the right side, and 2+ on the left side.  Respiratory: No respiratory distress. He has decreased breath sounds in the right lower field and the left lower field. He has no wheezes. He has no rhonchi. He has no  rales.  GI: Soft. Bowel sounds are normal. There is no tenderness.  Musculoskeletal:       Right ankle: He exhibits no swelling.       Left ankle: He exhibits no swelling.  Lymphadenopathy:    He has no cervical adenopathy.  Neurological: He is alert and oriented to person, place, and time. No cranial nerve deficit.  Skin: Skin is warm. No rash noted. Nails show no clubbing.  Right groin with pressure dressing still on it.  Psychiatric: He has a normal mood and affect.      Data Reviewed: Basic Metabolic Panel: Recent Labs  Lab 12/31/17 0027 12/31/17 0436 12/31/17 0946 01/02/18 1904  NA 135  --   --  140  K 6.6*  --   --  3.1*  CL 95*  --   --  98  CO2 24  --   --  32  GLUCOSE 527*  --   --  110*  BUN 89*  --   --  25*  CREATININE 7.42*  --   --  2.56*  CALCIUM 6.7*  --   --  7.3*  MG  --  2.7*  --   --   PHOS  --  4.7* 4.4 2.1*   Liver Function Tests: Recent Labs  Lab 12/31/17 0027 01/02/18 1904  AST 20  --   ALT 24  --   ALKPHOS 76  --   BILITOT 1.0  --  PROT 5.6*  --   ALBUMIN 3.5 3.0*   CBC: Recent Labs  Lab 12/31/17 0027 12/31/17 1155 01/02/18 1904  WBC 6.2  --  5.2  NEUTROABS 5.9  --   --   HGB 6.0* 7.4* 7.3*  HCT 18.0*  --  20.5*  MCV 98.8  --  92.1  PLT 141*  --  104*   Cardiac Enzymes: Recent Labs  Lab 12/31/17 0027  TROPONINI 0.03*    CBG: Recent Labs  Lab 01/02/18 1250 01/02/18 1617 01/02/18 2138 01/03/18 0752 01/03/18 1142  GLUCAP 119* 126* 111* 198* 240*    Recent Results (from the past 240 hour(s))  MRSA PCR Screening     Status: None   Collection Time: 12/25/17  9:58 AM  Result Value Ref Range Status   MRSA by PCR NEGATIVE NEGATIVE Final    Comment:        The GeneXpert MRSA Assay (FDA approved for NASAL specimens only), is one component of a comprehensive MRSA colonization surveillance program. It is not intended to diagnose MRSA infection nor to guide or monitor treatment for MRSA infections. Performed at  [facility], [address] [place], [place] [address]   Culture, blood (routine x 2)     Status: None (Preliminary result)   Collection Time: 12/31/17 12:30 AM  Result Value Ref Range Status   Specimen Description BLOOD RIGHT UPPER ARM  Final   Special Requests   Final    BOTTLES DRAWN AEROBIC AND ANAEROBIC Blood Culture adequate volume   Culture   Final    NO GROWTH 3 DAYS Performed at [facility], [address] [place], [place] [address]    Report Status PENDING  Incomplete  Urine culture     Status: None   Collection Time: 12/31/17  1:13 AM  Result Value Ref Range Status   Specimen Description   Final    URINE, RANDOM Performed at [facility], [address] [place], [place] [address]    Special Requests   Final    NONE Performed at [facility], [address] [place], [place] [address]    Culture   Final    NO GROWTH Performed at [facility], [place] [address] [place], [place] [address]    Report Status 01/01/2018 FINAL  Final  Culture, blood (routine x 2)     Status: None (Preliminary result)   Collection Time: 12/31/17  2:34 AM  Result Value Ref Range Status   Specimen Description BLOOD RIGHT ANTECUBITAL  Final   Special Requests   Final    BOTTLES DRAWN AEROBIC AND ANAEROBIC Blood Culture results may not be optimal due to an excessive volume of blood received in culture bottles   Culture   Final    NO GROWTH 3 DAYS Performed at [facility], [address], [place], [place] [address]    Report Status PENDING  Incomplete  MRSA PCR Screening     Status: None   Collection Time: 12/31/17  4:43 AM  Result Value Ref Range Status   MRSA by PCR NEGATIVE NEGATIVE Final    Comment:        The GeneXpert MRSA Assay (FDA approved for NASAL specimens only), is one component of a comprehensive MRSA colonization surveillance program. It is not intended to diagnose MRSA infection nor to guide or monitor  treatment for MRSA infections. Performed at [facility], [address], [place],  [address]  Scheduled Meds: . bumetanide  1 mg Intravenous Once  . Chlorhexidine Gluconate Cloth  6 each Topical Q0600  . Difluprednate  1 drop Left Eye BID  . feeding supplement (ENSURE ENLIVE)  237 mL Oral Daily  . gabapentin  100 mg Oral TID  . insulin aspart  0-15 Units Subcutaneous TID WC  . insulin detemir  10 Units Subcutaneous Daily  . ipratropium-albuterol  3 mL Nebulization TID  . mouth rinse  15 mL Mouth Rinse BID  . metoCLOPramide  5 mg Oral TID AC  . mometasone-formoterol  2 puff Inhalation BID  . multivitamin  1 tablet Oral QHS  . pravastatin  20 mg Oral q1800  . torsemide  100 mg Oral Once per day on Sun Tue Thu Sat   Continuous Infusions: . sodium chloride    . sodium chloride    . sodium chloride    . famotidine (PEPCID) IV Stopped (01/03/18 1045)    Assessment/Plan:  1. Acute on chronic anemia.  Transfuse 1 unit of packed red blood cells today on a hemoglobin of 7.3 since still oozing from the right groin.  Received 2 units of packed red blood cells already. 2. Hyperkalemia.  Repeat labs with dialysis today. 3. Recent diagnosis of DVT with transfusion dependent anemia.  Eliquis on hold.  IVC filter placed. 4. Acute pulmonary edema and acute on chronic respiratory failure. Currently on baseline 3 L.   5. Type 2 diabetes mellitus on detemir insulin and sliding scale 6. History of bladder cancer and CLL. 7. Hyperlipidemia unspecified on pravastatin 8. End-stage renal disease on dialysis 9. History of stroke.  Unable to give any blood thinners 10. DNR already ordered 11. Weakness.  Physical therapy evaluation  Code Status:     Code Status Orders  (From admission, onward)         Start     Ordered   12/31/17 0441  Do not attempt resuscitation (DNR)  Continuous    Question Answer Comment  In the event of cardiac or respiratory ARREST Do not call a  "code blue"   In the event of cardiac or respiratory ARREST Do not perform Intubation, CPR, defibrillation or ACLS   In the event of cardiac or respiratory ARREST Use medication by any route, position, wound care, and other measures to relive pain and suffering. May use oxygen, suction and manual treatment of airway obstruction as needed for comfort.      12/31/17 0440        Code Status History    Date Active Date Inactive Code Status Order ID Comments User Context   12/31/2017 0212 12/31/2017 0440 DNR [id number]  Arta Silence, MD ED   12/25/2017 0514 12/27/2017 1651 Partial Code [id number]  Harrie Foreman, MD ED   12/18/2017 1441 12/23/2017 1907 Partial Code [id number]  Asencion Gowda, NP Inpatient   01/15/2017 0423 01/18/2017 2120 Full Code [id number]  Saundra Shelling, MD Inpatient   10/20/2016 1627 10/22/2016 1817 Full Code [id number]  Lance Coon, MD Inpatient      Disposition Plan: Patient now interested in rehab  Consultants:  Nephrology  Vascular surgery  Oncology  Time spent: 24 minutes  [place]

## 2018-01-03 NOTE — Progress Notes (Signed)
[facility]  ROUNDING NOTE   Subjective:  Patient completed hemodialysis yesterday. He is receiving additional blood transfusion today.   Objective:  Vital signs in last 24 hours:  Temp:  [98.4 F (36.9 C)-100.7 F (38.2 C)] 99.1 F (37.3 C) (10/03 1149) Pulse Rate:  [71-96] 96 (10/03 1149) Resp:  [13-27] 18 (10/03 1149) BP: (73-145)/(39-106) 93/78 (10/03 1149) SpO2:  [91 %-100 %] 95 % (10/03 1149) FiO2 (%):  [44 %] 44 % (10/03 0155) Weight:  [92.1 kg-93.5 kg] 92.1 kg (10/03 0428)  Weight change:  Filed Weights   01/02/18 1708 01/02/18 2104 01/03/18 0428  Weight: 93.5 kg 92.7 kg 92.1 kg    Intake/Output: I/O last 3 completed shifts: In: 58.6 [IV Piggyback:58.6] Out: 1009 [Other:1009]   Intake/Output this shift:  Total I/O In: 93.9 [IV Piggyback:93.9] Out: -   Physical Exam: General: No acute distress  Head: Normocephalic, atraumatic. Moist oral mucosal membranes  Eyes: Anicteric, left conjunctival erythema  Neck: Supple, trachea midline  Lungs:  Scattered rhonchi, normal effort  CV: S1S2 no rubs  Abdomen:  Soft, nontender, BS present  Extremities: No peripheral edema.  Neurologic: Awake, alert, following commands  Skin: No lesions  Access: Left forearm AVF    Basic Metabolic Panel: Recent Labs  Lab 12/31/17 0027 12/31/17 0436 12/31/17 0946 01/02/18 1904  NA 135  --   --  140  K 6.6*  --   --  3.1*  CL 95*  --   --  98  CO2 24  --   --  32  GLUCOSE 527*  --   --  110*  BUN 89*  --   --  25*  CREATININE 7.42*  --   --  2.56*  CALCIUM 6.7*  --   --  7.3*  MG  --  2.7*  --   --   PHOS  --  4.7* 4.4 2.1*    Liver Function Tests: Recent Labs  Lab 12/31/17 0027 01/02/18 1904  AST 20  --   ALT 24  --   ALKPHOS 76  --   BILITOT 1.0  --   PROT 5.6*  --   ALBUMIN 3.5 3.0*   No results for input(s): LIPASE, AMYLASE in the last 168 hours. No results for input(s): AMMONIA in the last 168 hours.  CBC: Recent Labs  Lab  12/31/17 0027 12/31/17 1155 01/02/18 1904  WBC 6.2  --  5.2  NEUTROABS 5.9  --   --   HGB 6.0* 7.4* 7.3*  HCT 18.0*  --  20.5*  MCV 98.8  --  92.1  PLT 141*  --  104*    Cardiac Enzymes: Recent Labs  Lab 12/31/17 0027  TROPONINI 0.03*    BNP: Invalid input(s): POCBNP  CBG: Recent Labs  Lab 01/02/18 1250 01/02/18 1617 01/02/18 2138 01/03/18 0752 01/03/18 1142  GLUCAP 119* 126* 111* 198* 240*    Microbiology: Results for orders placed or performed during the hospital encounter of 12/30/17  Culture, blood (routine x 2)     Status: None (Preliminary result)   Collection Time: 12/31/17 12:30 AM  Result Value Ref Range Status   Specimen Description BLOOD RIGHT UPPER ARM  Final   Special Requests   Final    BOTTLES DRAWN AEROBIC AND ANAEROBIC Blood Culture adequate volume   Culture   Final    NO GROWTH 3 DAYS Performed at [facility], [address], [place], [place] [address]    Report Status  PENDING  Incomplete  Urine culture     Status: None   Collection Time: 12/31/17  1:13 AM  Result Value Ref Range Status   Specimen Description   Final    URINE, RANDOM Performed at [facility], [address] [place], [place] [address]    Special Requests   Final    NONE Performed at [facility], [address] [place], [place] [address]    Culture   Final    NO GROWTH Performed at [facility], [place] [address], [place], [place] [address]    Report Status 01/01/2018 FINAL  Final  Culture, blood (routine x 2)     Status: None (Preliminary result)   Collection Time: 12/31/17  2:34 AM  Result Value Ref Range Status   Specimen Description BLOOD RIGHT ANTECUBITAL  Final   Special Requests   Final    BOTTLES DRAWN AEROBIC AND ANAEROBIC Blood Culture results may not be optimal due to an excessive volume of blood received in culture bottles   Culture   Final    NO GROWTH 3 DAYS Performed at [facility], [address], [place], [place] [address]    Report Status PENDING  Incomplete  MRSA PCR Screening     Status: None   Collection Time: 12/31/17  4:43 AM  Result Value Ref Range Status   MRSA by PCR NEGATIVE NEGATIVE Final    Comment:        The GeneXpert MRSA Assay (FDA approved for NASAL specimens only), is one component of a comprehensive MRSA colonization surveillance program. It is not intended to diagnose MRSA infection nor to guide or monitor treatment for MRSA infections. Performed at [facility], [address] [place], [place] [address]     Coagulation Studies: No results for input(s): LABPROT, INR in the last 72 hours.  Urinalysis: No results for input(s): COLORURINE, LABSPEC, PHURINE, GLUCOSEU, HGBUR, BILIRUBINUR, KETONESUR, PROTEINUR, UROBILINOGEN, NITRITE, LEUKOCYTESUR in the last 72 hours.  Invalid input(s): APPERANCEUR    Imaging: No results found.   Medications:   . sodium chloride    . sodium chloride    . sodium chloride    . famotidine (PEPCID) IV Stopped (01/03/18 1045)   . bumetanide  1 mg Intravenous Once  . Chlorhexidine Gluconate Cloth  6 each Topical Q0600  . Difluprednate  1 drop Left Eye BID  . feeding supplement (ENSURE ENLIVE)  237 mL Oral Daily  . gabapentin  100 mg Oral TID  . insulin aspart  0-15 Units Subcutaneous TID WC  . insulin detemir  10 Units Subcutaneous Daily  . ipratropium-albuterol  3 mL Nebulization TID  . mouth rinse  15 mL Mouth Rinse BID  . metoCLOPramide  5 mg Oral TID AC  . mometasone-formoterol  2 puff Inhalation BID  . multivitamin  1 tablet Oral QHS  . pravastatin  20 mg Oral q1800  . torsemide  100 mg Oral Once per day on Sun Tue Thu Sat   sodium chloride, sodium chloride, acetaminophen **OR** acetaminophen, albuterol, alteplase, bisacodyl, dextrose, ipratropium-albuterol, lidocaine (PF), lidocaine-prilocaine, midodrine, ondansetron **OR** ondansetron (ZOFRAN) IV, pentafluoroprop-tetrafluoroeth, polyvinyl  alcohol, senna-docusate, sodium chloride flush  Assessment/ Plan:  [name]. is a 72 y.o. black male with end stage renal disease on hemodialysis, CLL, diabetes mellitus type II, COPD, history of bladder cancer who is admitted to [facility] on 12/30/2017 for shortness of breath.   [facility] MWF [facility] AVF 90.5kg  1.  ESRD on HD MWF. Patient completed hemodialysis yesterday.  No acute indication for dialysis today.  We will plan for dialysis again tomorrow.  2.  Anemia of chronic kidney disease.  Hemoglobin still low at 7.3.  Hospitalist planning blood transfusion today.  3.  Secondary hyperparathyroidism: serum phosphorus actually low at 2.1.  Not currently on binder therapy.  Recheck phosphorus tomorrow.  4. Rt femoral DVT -Previously on Eliquis for this issue.  Now a GI bleed.  5.  Hyperkalemia.  Resolved with dialysis.  LOS: 3 Mark [name] 10/3/20193:18 PM

## 2018-01-03 NOTE — Progress Notes (Signed)
[facility]  Daily Progress Note   Port-A-Cath placed on 01/02/18 by Dr. Delana Meyer. IVC Filter placed on 12/31/17 by Dr. Lucky Cowboy. IVC filter to stay in place permanently.  No follow up as outpatient needed for Port-A-Cath or IVC Filter. Continue routine follow up for dialysis access. Vascular surgery to sign off at this time.  Reconsult if needed.   Marcelle Overlie PA-C 01/03/2018 1:12 PM

## 2018-01-04 ENCOUNTER — Inpatient Hospital Stay: Payer: Medicare HMO

## 2018-01-04 DIAGNOSIS — N186 End stage renal disease: Secondary | ICD-10-CM

## 2018-01-04 DIAGNOSIS — Z992 Dependence on renal dialysis: Secondary | ICD-10-CM

## 2018-01-04 DIAGNOSIS — J9621 Acute and chronic respiratory failure with hypoxia: Secondary | ICD-10-CM

## 2018-01-04 LAB — TYPE AND SCREEN
ABO/RH(D): B POS
Antibody Screen: NEGATIVE
UNIT DIVISION: 0
UNIT DIVISION: 0
Unit division: 0

## 2018-01-04 LAB — BLOOD GAS, ARTERIAL
Acid-Base Excess: 4.6 mmol/L — ABNORMAL HIGH (ref 0.0–2.0)
Allens test (pass/fail): POSITIVE — AB
Bicarbonate: 27.9 mmol/L (ref 20.0–28.0)
FIO2: 44
O2 Saturation: 77.3 %
PCO2 ART: 35 mmHg (ref 32.0–48.0)
PH ART: 7.51 — AB (ref 7.350–7.450)
PO2 ART: 37 mmHg — AB (ref 83.0–108.0)
Patient temperature: 37

## 2018-01-04 LAB — BPAM RBC
BLOOD PRODUCT EXPIRATION DATE: 201910242359
Blood Product Expiration Date: 201910192359
Blood Product Expiration Date: 201910212359
ISSUE DATE / TIME: 201910011148
ISSUE DATE / TIME: 201909300520
ISSUE DATE / TIME: 201910031535
UNIT TYPE AND RH: 7300
UNIT TYPE AND RH: 7300
UNIT TYPE AND RH: 7300

## 2018-01-04 LAB — CBC
HCT: 21.2 % — ABNORMAL LOW (ref 40.0–52.0)
HEMOGLOBIN: 7.3 g/dL — AB (ref 13.0–18.0)
MCH: 31.3 pg (ref 26.0–34.0)
MCHC: 34.5 g/dL (ref 32.0–36.0)
MCV: 90.9 fL (ref 80.0–100.0)
PLATELETS: 67 10*3/uL — AB (ref 150–440)
RBC: 2.34 MIL/uL — ABNORMAL LOW (ref 4.40–5.90)
RDW: 17.6 % — AB (ref 11.5–14.5)
WBC: 6.2 10*3/uL (ref 3.8–10.6)

## 2018-01-04 LAB — GLUCOSE, CAPILLARY
GLUCOSE-CAPILLARY: 122 mg/dL — AB (ref 70–99)
GLUCOSE-CAPILLARY: 198 mg/dL — AB (ref 70–99)
GLUCOSE-CAPILLARY: 200 mg/dL — AB (ref 70–99)
Glucose-Capillary: 190 mg/dL — ABNORMAL HIGH (ref 70–99)
Glucose-Capillary: 251 mg/dL — ABNORMAL HIGH (ref 70–99)
Glucose-Capillary: 189 mg/dL — ABNORMAL HIGH (ref 70–99)
Glucose-Capillary: 194 mg/dL — ABNORMAL HIGH (ref 70–99)

## 2018-01-04 LAB — MAGNESIUM: Magnesium: 1.7 mg/dL (ref 1.7–2.4)

## 2018-01-04 LAB — PREPARE RBC (CROSSMATCH)

## 2018-01-04 LAB — POTASSIUM: POTASSIUM: 3.3 mmol/L — AB (ref 3.5–5.1)

## 2018-01-04 LAB — PHOSPHORUS
PHOSPHORUS: 3.7 mg/dL (ref 2.5–4.6)
PHOSPHORUS: 2.4 mg/dL — AB (ref 2.5–4.6)

## 2018-01-04 MED ORDER — CHLORHEXIDINE GLUCONATE 0.12 % MT SOLN
15.0000 mL | Freq: Two times a day (BID) | OROMUCOSAL | Status: DC
  Administered 2018-01-04 – 2018-01-08 (×9): 15 mL via OROMUCOSAL
  Filled 2018-01-04 (×8): qty 15

## 2018-01-04 MED ORDER — K PHOS MONO-SOD PHOS DI & MONO 155-852-130 MG PO TABS
250.0000 mg | ORAL_TABLET | Freq: Once | ORAL | Status: AC
  Administered 2018-01-04: 250 mg via ORAL
  Filled 2018-01-04: qty 1

## 2018-01-04 MED ORDER — SODIUM CHLORIDE 0.9% IV SOLUTION: Freq: Once | INTRAVENOUS | Status: DC

## 2018-01-04 MED ORDER — SODIUM CHLORIDE 0.9 % IV SOLN
INTRAVENOUS | Status: DC | PRN
  Administered 2018-01-04 – 2018-01-05 (×3): 250 mL via INTRAVENOUS

## 2018-01-04 MED ORDER — METHYLPREDNISOLONE SODIUM SUCC 40 MG IJ SOLR
40.0000 mg | Freq: Two times a day (BID) | INTRAMUSCULAR | Status: DC
  Administered 2018-01-04 – 2018-01-08 (×9): 40 mg via INTRAVENOUS
  Filled 2018-01-04 (×9): qty 1

## 2018-01-04 MED ORDER — ORAL CARE MOUTH RINSE
15.0000 mL | Freq: Two times a day (BID) | OROMUCOSAL | Status: DC
  Administered 2018-01-06 – 2018-01-08 (×4): 15 mL via OROMUCOSAL

## 2018-01-04 MED ORDER — MAGNESIUM SULFATE 2 GM/50ML IV SOLN
2.0000 g | Freq: Once | INTRAVENOUS | Status: AC
  Administered 2018-01-04: 2 g via INTRAVENOUS
  Filled 2018-01-04: qty 50

## 2018-01-04 MED ORDER — FUROSEMIDE 10 MG/ML IJ SOLN
120.0000 mg | Freq: Once | INTRAVENOUS | Status: DC
  Filled 2018-01-04: qty 12

## 2018-01-04 NOTE — Progress Notes (Signed)
Printed EKG and given to [facility] NP. SHe will enter orders.

## 2018-01-04 NOTE — Progress Notes (Signed)
This note also relates to the following rows which could not be included: Pulse Rate - Cannot attach notes to unvalidated device data Resp - Cannot attach notes to unvalidated device data BP - Cannot attach notes to unvalidated device data  Hd started  

## 2018-01-04 NOTE — Progress Notes (Signed)
[facility]  ROUNDING NOTE   Subjective:  Patient seen at bedside. Worsening respiratory status noted today. Case discussed with hospitalist.   Objective:  Vital signs in last 24 hours:  Temp:  [98.4 F (36.9 C)-101.5 F (38.6 C)] 101.5 F (38.6 C) (10/04 1135) Pulse Rate:  [84-107] 102 (10/04 1135) Resp:  [18-35] 28 (10/04 1135) BP: (100-162)/(70-117) 131/86 (10/04 1135) SpO2:  [78 %-100 %] 99 % (10/04 1135) Weight:  [91.7 kg] 91.7 kg (10/04 1135)  Weight change:  Filed Weights   01/02/18 2104 01/03/18 0428 01/04/18 1135  Weight: 92.7 kg 92.1 kg 91.7 kg    Intake/Output: I/O last 3 completed shifts: In: 333.9 [P.O.:240; IV Piggyback:93.9] Out: 1009 [Other:1009]   Intake/Output this shift:  Total I/O In: 240 [P.O.:240] Out: -   Physical Exam: General: No acute distress  Head: Normocephalic, atraumatic. Moist oral mucosal membranes  Eyes: Anicteric, left conjunctival erythema  Neck: Supple, trachea midline  Lungs:  Scattered rhonchi, normal effort  CV: S1S2 no rubs  Abdomen:  Soft, nontender, BS present  Extremities: No peripheral edema.  Neurologic: Awake, alert, following commands  Skin: No lesions  Access: Left forearm AVF    Basic Metabolic Panel: Recent Labs  Lab 12/31/17 0027 12/31/17 0436 12/31/17 0946 01/02/18 1904 01/04/18 0804  NA 135  --   --  140 138  K 6.6*  --   --  3.1* 3.7  CL 95*  --   --  98 95*  CO2 24  --   --  32 28  GLUCOSE 527*  --   --  110* 150*  BUN 89*  --   --  25* 66*  CREATININE 7.42*  --   --  2.56* 6.44*  CALCIUM 6.7*  --   --  7.3* 6.2*  MG  --  2.7*  --   --   --   PHOS  --  4.7* 4.4 2.1*  --     Liver Function Tests: Recent Labs  Lab 12/31/17 0027 01/02/18 1904  AST 20  --   ALT 24  --   ALKPHOS 76  --   BILITOT 1.0  --   PROT 5.6*  --   ALBUMIN 3.5 3.0*   No results for input(s): LIPASE, AMYLASE in the last 168 hours. No results for input(s): AMMONIA in the last 168  hours.  CBC: Recent Labs  Lab 12/31/17 0027 12/31/17 1155 01/02/18 1904 01/03/18 2157 01/04/18 0804  WBC 6.2  --  5.2  --  6.2  NEUTROABS 5.9  --   --   --   --   HGB 6.0* 7.4* 7.3* 7.2* 7.3*  HCT 18.0*  --  20.5* 20.5* 21.2*  MCV 98.8  --  92.1  --  90.9  PLT 141*  --  104*  --  67*    Cardiac Enzymes: Recent Labs  Lab 12/31/17 0027  TROPONINI 0.03*    BNP: Invalid input(s): POCBNP  CBG: Recent Labs  Lab 01/03/18 1614 01/03/18 2104 01/04/18 0742 01/04/18 1104 01/04/18 1132  GLUCAP 242* 196* 122* 190* 198*    Microbiology: Results for orders placed or performed during the hospital encounter of 12/30/17  Culture, blood (routine x 2)     Status: None (Preliminary result)   Collection Time: 12/31/17 12:30 AM  Result Value Ref Range Status   Specimen Description BLOOD RIGHT UPPER ARM  Final   Special Requests   Final    BOTTLES DRAWN AEROBIC AND ANAEROBIC  Blood Culture adequate volume   Culture   Final    NO GROWTH 4 DAYS Performed at [facility], [address] [place], [place] [address]    Report Status PENDING  Incomplete  Urine culture     Status: None   Collection Time: 12/31/17  1:13 AM  Result Value Ref Range Status   Specimen Description   Final    URINE, RANDOM Performed at [facility], [address], [place], [place] [address]    Special Requests   Final    NONE Performed at [facility], [address], [place], [place] [address]    Culture   Final    NO GROWTH Performed at [facility], [place] [address], [place], [place] [address]    Report Status 01/01/2018 FINAL  Final  Culture, blood (routine x 2)     Status: None (Preliminary result)   Collection Time: 12/31/17  2:34 AM  Result Value Ref Range Status   Specimen Description BLOOD RIGHT ANTECUBITAL  Final   Special Requests   Final    BOTTLES DRAWN AEROBIC AND ANAEROBIC Blood Culture results may not be optimal due to an excessive volume of blood  received in culture bottles   Culture   Final    NO GROWTH 4 DAYS Performed at [facility], [address], [place], [place] [address]    Report Status PENDING  Incomplete  MRSA PCR Screening     Status: None   Collection Time: 12/31/17  4:43 AM  Result Value Ref Range Status   MRSA by PCR NEGATIVE NEGATIVE Final    Comment:        The GeneXpert MRSA Assay (FDA approved for NASAL specimens only), is one component of a comprehensive MRSA colonization surveillance program. It is not intended to diagnose MRSA infection nor to guide or monitor treatment for MRSA infections. Performed at [facility], [address] [place],  [address]     Coagulation Studies: No results for input(s): LABPROT, INR in the last 72 hours.  Urinalysis: No results for input(s): COLORURINE, LABSPEC, PHURINE, GLUCOSEU, HGBUR, BILIRUBINUR, KETONESUR, PROTEINUR, UROBILINOGEN, NITRITE, LEUKOCYTESUR in the last 72 hours.  Invalid input(s): APPERANCEUR    Imaging: Dg Chest Port 1 View  Result Date: 01/04/2018 CLINICAL DATA:  Short of breath on exertion EXAM: PORTABLE CHEST 1 VIEW COMPARISON:  12/30/2017 FINDINGS: Progressive bilateral airspace disease with basilar predominance. Progression of small right effusion. Interval placement of Port-A-Cath with the tip in the right atrium. No pneumothorax Port-A-Cath tip in the right atrium Progressive bilateral airspace disease and small right effusion. IMPRESSION: No active disease. Electronically Signed   By: Franchot Gallo M.D.   On: 01/04/2018 11:10     Medications:   . sodium chloride    . sodium chloride    . sodium chloride    . sodium chloride 250 mL (01/04/18 1103)  . famotidine (PEPCID) IV 20 mg (01/04/18 0850)  . furosemide     . sodium chloride   Intravenous Once  . bumetanide  1 mg Intravenous Once  . chlorhexidine  15 mL Mouth Rinse BID  . Chlorhexidine Gluconate Cloth  6 each Topical Q0600  . Difluprednate  1  drop Left Eye BID  . feeding supplement (ENSURE ENLIVE)  237 mL Oral Daily  . gabapentin  100 mg Oral TID  . insulin aspart  0-15 Units Subcutaneous TID WC  . insulin detemir  10 Units Subcutaneous Daily  .  ipratropium-albuterol  3 mL Nebulization TID  . mouth rinse  15 mL Mouth Rinse BID  . mouth rinse  15 mL Mouth Rinse q12n4p  . methylPREDNISolone (SOLU-MEDROL) injection  40 mg Intravenous Q12H  . metoCLOPramide  5 mg Oral TID AC  . mometasone-formoterol  2 puff Inhalation BID  . multivitamin  1 tablet Oral QHS  . pravastatin  20 mg Oral q1800  . torsemide  100 mg Oral Once per day on Sun Tue Thu Sat   sodium chloride, sodium chloride, sodium chloride, acetaminophen **OR** acetaminophen, albuterol, alteplase, bisacodyl, dextrose, ipratropium-albuterol, lidocaine (PF), lidocaine-prilocaine, midodrine, ondansetron **OR** ondansetron (ZOFRAN) IV, pentafluoroprop-tetrafluoroeth, polyvinyl alcohol, senna-docusate, sodium chloride flush  Assessment/ Plan:  [name]. is a 72 y.o. black male with end stage renal disease on hemodialysis, CLL, diabetes mellitus type II, COPD, history of bladder cancer who is admitted to [facility] on 12/30/2017 for shortness of breath.   [facility] MWF [facility] AVF 90.5kg  1.  ESRD on HD MWF. Patient due for hemodialysis today.  Orders have been prepared.  2.  Anemia of chronic kidney disease.  Hemoglobin did not improve much despite blood transfusion.  Hemoglobin currently 7.3.  Further management as per hospitalist.  3.  Secondary hyperparathyroidism: We plan to recheck serum phosphorus today.  4. Rt femoral DVT -Previously on Eliquis for this issue.  Now a GI bleed.  5.  Hyperkalemia.  Resolved with dialysis.  6.  Increasing shortness of breath.  Bilateral airspace disease noted.  Likely represents pneumonia as the patient has not had very significant input.  LOS: 4 Mark [name] 10/4/201912:30 PM

## 2018-01-04 NOTE — NC FL2 (Signed)
[place] LEVEL OF CARE SCREENING TOOL     IDENTIFICATION  Patient Name: Mark [name]. Birthdate: January 22, 1946 Sex: male Admission Date (Current Location): 12/30/2017  [place] and [place] Number:  Engineering geologist and Address:  [facility], [address], [place], [place] [address]      Provider Number: [id number]  Attending Physician Name and Address:  Loletha Grayer, MD  Relative Name and Phone Number:       Current Level of Care: Hospital Recommended Level of Care: [place] Prior Approval Number:    Date Approved/Denied:   PASRR Number:    Discharge Plan: SNF    Current Diagnoses: Patient Active Problem List   Diagnosis Date Noted  . Symptomatic anemia 12/31/2017  . Acute on chronic respiratory failure with hypoxemia ([place]) 12/31/2017  . Hypoxia 12/25/2017  . Pressure injury of skin 12/16/2017  . Fluid overload 01/15/2017  . UTI (urinary tract infection) 10/20/2016  . Sepsis ([place]) 10/20/2016  . 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 Ulcer of foot, chronic ([place]) 03/21/2015  . History of bladder cancer 11/10/2014  . Penile bleeding 10/12/2014  . Dependence on renal dialysis ([place]) 08/18/2013  . Arterial blood pressure decreased 08/18/2013  . Leukemia ([place]) 08/18/2013  . Retina disorder 08/18/2013  . Breath shortness 08/18/2013  . End-stage renal disease on hemodialysis ([place]) 12/23/2012  . Absolute anemia 07/18/2012  . HPTH (hyperparathyroidism) ([place]) 07/18/2012  . Acidosis, metabolic 01/25/8526  . Hyperparathyroidism ([place]) 07/18/2012  . Abnormal presence of protein in urine 01/01/2012  . HLD (hyperlipidemia) 12/25/2011  . Diabetic retinopathy associated with type 2 diabetes mellitus ([place]) 10/26/1999  . Essential (primary) hypertension 04/04/1999  . Diabetes mellitus type 2, uncontrolled ([place]) 04/04/1999  . Type 2  diabetes mellitus with other diabetic kidney complication ([place]) 78/24/2353    Orientation RESPIRATION BLADDER Height & Weight     Self, Situation, Place, Time  Normal, O2(6 liters) Continent Weight: 203 lb 0.7 oz (92.1 kg) Height:  5\' 11"  (180.3 cm)  BEHAVIORAL SYMPTOMS/MOOD NEUROLOGICAL BOWEL NUTRITION STATUS  (none) (none) Continent Diet  AMBULATORY STATUS COMMUNICATION OF NEEDS Skin   Extensive Assist   Normal                       Personal Care Assistance Level of Assistance  Bathing, Feeding, Dressing Bathing Assistance: Limited assistance Feeding assistance: Limited assistance Dressing Assistance: Limited assistance     Functional Limitations Info  Sight Sight Info: Impaired        SPECIAL CARE FACTORS FREQUENCY  PT (By licensed PT)                    Contractures Contractures Info: Not present    Additional Factors Info  Code Status(outpatient HDialysis) Code Status Info: dnr             Current Medications (01/04/2018):  This is the current hospital active medication list Current Facility-Administered Medications  Medication Dose Route Frequency Provider Last Rate Last Dose  . 0.9 %  sodium chloride infusion (Manually program via Guardrails IV Fluids)   Intravenous Once Wieting, Richard, MD      . 0.9 %  sodium chloride infusion  10 mL/hr Intravenous Once Algernon Huxley, MD      . 0.9 %  sodium chloride infusion  100 mL Intravenous PRN Lucky Cowboy, Erskine Squibb, MD      .  0.9 %  sodium chloride infusion  100 mL Intravenous PRN Algernon Huxley, MD      . 0.9 %  sodium chloride infusion   Intravenous PRN Loletha Grayer, MD 10 mL/hr at 01/04/18 0849 250 mL at 01/04/18 0849  . acetaminophen (TYLENOL) tablet 650 mg  650 mg Oral Q6H PRN Algernon Huxley, MD       Or  . acetaminophen (TYLENOL) suppository 650 mg  650 mg Rectal Q6H PRN Algernon Huxley, MD      . albuterol (PROVENTIL) (2.5 MG/3ML) 0.083% nebulizer solution 2.5 mg  2.5 mg Nebulization Q6H PRN Algernon Huxley, MD       . alteplase (CATHFLO ACTIVASE) injection 2 mg  2 mg Intracatheter Once PRN Algernon Huxley, MD      . bisacodyl (DULCOLAX) EC tablet 5 mg  5 mg Oral Daily PRN Algernon Huxley, MD   5 mg at 01/03/18 2053  . bumetanide (BUMEX) injection 1 mg  1 mg Intravenous Once Algernon Huxley, MD      . Chlorhexidine Gluconate Cloth 2 % PADS 6 each  6 each Topical [id number] Algernon Huxley, MD   Stopped at 12/31/17 1803  . dextrose 50 % solution 25 mL  25 mL Intravenous PRN Algernon Huxley, MD      . Difluprednate 0.05 % EMUL 1 drop  1 drop Left Eye BID Algernon Huxley, MD   1 drop at 01/04/18 0844  . famotidine (PEPCID) IVPB 20 mg premix  20 mg Intravenous Q24H Algernon Huxley, MD 100 mL/hr at 01/04/18 0850 20 mg at 01/04/18 0850  . feeding supplement (ENSURE ENLIVE) (ENSURE ENLIVE) liquid 237 mL  237 mL Oral Daily Algernon Huxley, MD   237 mL at 01/03/18 1007  . gabapentin (NEURONTIN) capsule 100 mg  100 mg Oral TID Algernon Huxley, MD   100 mg at 01/04/18 0837  . insulin aspart (novoLOG) injection 0-15 Units  0-15 Units Subcutaneous TID WC Algernon Huxley, MD   2 Units at 01/04/18 [phone]  . insulin detemir (LEVEMIR) injection 10 Units  10 Units Subcutaneous Daily Algernon Huxley, MD   10 Units at 01/04/18 [phone]  . ipratropium-albuterol (DUONEB) 0.5-2.5 (3) MG/3ML nebulizer solution 3 mL  3 mL Nebulization Q4H PRN Algernon Huxley, MD   3 mL at 01/03/18 1112  . ipratropium-albuterol (DUONEB) 0.5-2.5 (3) MG/3ML nebulizer solution 3 mL  3 mL Nebulization TID Loletha Grayer, MD   3 mL at 01/04/18 0733  . lidocaine (PF) (XYLOCAINE) 1 % injection 5 mL  5 mL Intradermal PRN Algernon Huxley, MD      . lidocaine-prilocaine (EMLA) cream 1 application  1 application Topical PRN Algernon Huxley, MD      . MEDLINE mouth rinse  15 mL Mouth Rinse BID Algernon Huxley, MD   15 mL at 01/04/18 0851  . metoCLOPramide (REGLAN) tablet 5 mg  5 mg Oral TID AC Algernon Huxley, MD   5 mg at 01/04/18 0837  . midodrine (PROAMATINE) tablet 10 mg  10 mg Oral Q dialysis Lateef, [place], MD       . mometasone-formoterol (DULERA) 200-5 MCG/ACT inhaler 2 puff  2 puff Inhalation BID Algernon Huxley, MD   2 puff at 01/04/18 0837  . multivitamin (RENA-VIT) tablet 1 tablet  1 tablet Oral QHS Algernon Huxley, MD   1 tablet at 01/03/18 2053  . ondansetron (ZOFRAN) tablet 4 mg  4  mg Oral Q6H PRN Algernon Huxley, MD       Or  . ondansetron (ZOFRAN) injection 4 mg  4 mg Intravenous Q6H PRN Lucky Cowboy, Erskine Squibb, MD      . pentafluoroprop-tetrafluoroeth (GEBAUERS) aerosol 1 application  1 application Topical PRN Dew, Erskine Squibb, MD      . polyvinyl alcohol (LIQUIFILM TEARS) 1.4 % ophthalmic solution 1 drop  1 drop Both Eyes QID PRN Algernon Huxley, MD      . pravastatin (PRAVACHOL) tablet 20 mg  20 mg Oral q1800 Algernon Huxley, MD   20 mg at 01/03/18 1613  . senna-docusate (Senokot-S) tablet 1 tablet  1 tablet Oral QHS PRN Algernon Huxley, MD      . sodium chloride flush (NS) 0.9 % injection 10-40 mL  10-40 mL Intracatheter PRN Arta Silence, MD      . torsemide (DEMADEX) tablet 100 mg  100 mg Oral Once per day on Sun Tue Thu Sat Algernon Huxley, MD   100 mg at 01/01/18 2376     Discharge Medications: Please see discharge summary for a list of discharge medications.  Relevant Imaging Results:  Relevant Lab Results:   Additional Information ss: [id number]  Shela Leff, LCSW

## 2018-01-04 NOTE — Progress Notes (Signed)
RN saw on the telemetry monitor that pt.'s pulse ox was dropping to low 70s. He is currently on 6L. RN immediately went into to assess pt. Pt was demonstrating very labored, purse lip, shallow breathing. Pt is also lethargic. RN took VS- T-100.1 oral, BP 132/70, P-107, R-35, O2- 78 on 6 L. Breathing tx was given to pt at 1033. RN notified MD. MD placed orders. MD immediately came to bedside to assess pt. Respiratory was called about ABG orders and pt needing BIPAP STAT. Pt to be transferred to ICU 13 with BIPAP, all pt belongings were packed up and transferred with pt. Report given to Katharine Look, [place]. RN called spouse to update her on pt.'s status change.   Sebastin Perlmutter [employer]

## 2018-01-04 NOTE — Care Management Important Message (Signed)
Patient transferred to ICU.  Withheld delivery of concurrent IM at this time.

## 2018-01-04 NOTE — Progress Notes (Signed)
Patient rhythm WAP. Hinton Dyer NP notified. She will review and place orders.

## 2018-01-04 NOTE — Progress Notes (Signed)
Hd completed 

## 2018-01-04 NOTE — Progress Notes (Signed)
Patient ID: Regenia Skeeter., male   DOB: November 09, 1945, 71 y.o.   MRN: [id number]     [facility] PROGRESS NOTE  Laretta Alstrom [name]. WEX:[id number] DOB: 23-Dec-1945 DOA: 12/30/2017 PCP: Albina Billet, MD  HPI/Subjective: Patient woke up at 4am with shortness of breath and settled down by 8am.  I was called by nurse for respiratory ditress and low puls ox in the 70'S.  Patient still short of breath.  No Chest pain.  Positive for leg cramps.    Objective: Vitals:   01/04/18 1033 01/04/18 1034  BP: 132/70   Pulse: (!) 107   Resp: (!) 35   Temp: (!) 100.5 F (38.1 C)   SpO2: (!) 78% (!) 88%    Filed Weights   01/02/18 1708 01/02/18 2104 01/03/18 0428  Weight: 93.5 kg 92.7 kg 92.1 kg    ROS: Review of Systems  Constitutional: Positive for fever and malaise/fatigue. Negative for chills.  Eyes: Negative for blurred vision.  Respiratory: Positive for cough and shortness of breath.   Cardiovascular: Negative for chest pain.  Gastrointestinal: Negative for abdominal pain, constipation, diarrhea, nausea and vomiting.  Genitourinary: Negative for dysuria.  Musculoskeletal: Negative for joint pain.  Neurological: Negative for dizziness and headaches.   Exam: Physical Exam  Constitutional: He is oriented to person, place, and time.  HENT:  Nose: No mucosal edema.  Mouth/Throat: No oropharyngeal exudate or posterior oropharyngeal edema.  Eyes: Pupils are equal, round, and reactive to light. Conjunctivae, EOM and lids are normal.  Neck: No JVD present. Carotid bruit is not present. No edema present. No thyroid mass and no thyromegaly present.  Cardiovascular: S1 normal and S2 normal. Exam reveals no gallop.  No murmur heard. Pulses:      Dorsalis pedis pulses are 2+ on the right side, and 2+ on the left side.  Respiratory: No respiratory distress. He has decreased breath sounds in the right lower field and the left lower field. He has no wheezes. He has no rhonchi. He has no  rales.  GI: Soft. Bowel sounds are normal. There is no tenderness.  Musculoskeletal:       Right ankle: He exhibits no swelling.       Left ankle: He exhibits no swelling.  Lymphadenopathy:    He has no cervical adenopathy.  Neurological: He is alert and oriented to person, place, and time. No cranial nerve deficit.  Skin: Skin is warm. No rash noted. Nails show no clubbing.  Right groin with pressure dressing still on it.  Psychiatric: He has a normal mood and affect.      Data Reviewed: Basic Metabolic Panel: Recent Labs  Lab 12/31/17 0027 12/31/17 0436 12/31/17 0946 01/02/18 1904 01/04/18 0804  NA 135  --   --  140 138  K 6.6*  --   --  3.1* 3.7  CL 95*  --   --  98 95*  CO2 24  --   --  32 28  GLUCOSE 527*  --   --  110* 150*  BUN 89*  --   --  25* 66*  CREATININE 7.42*  --   --  2.56* 6.44*  CALCIUM 6.7*  --   --  7.3* 6.2*  MG  --  2.7*  --   --   --   PHOS  --  4.7* 4.4 2.1*  --    Liver Function Tests: Recent Labs  Lab 12/31/17 0027 01/02/18 1904  AST 20  --  ALT 24  --   ALKPHOS 76  --   BILITOT 1.0  --   PROT 5.6*  --   ALBUMIN 3.5 3.0*   CBC: Recent Labs  Lab 12/31/17 0027 12/31/17 1155 01/02/18 1904 01/03/18 2157  WBC 6.2  --  5.2  --   NEUTROABS 5.9  --   --   --   HGB 6.0* 7.4* 7.3* 7.2*  HCT 18.0*  --  20.5* 20.5*  MCV 98.8  --  92.1  --   PLT 141*  --  104*  --    Cardiac Enzymes: Recent Labs  Lab 12/31/17 0027  TROPONINI 0.03*    CBG: Recent Labs  Lab 01/03/18 0752 01/03/18 1142 01/03/18 1614 01/03/18 2104 01/04/18 0742  GLUCAP 198* 240* 242* 196* 122*    Recent Results (from the past 240 hour(s))  Culture, blood (routine x 2)     Status: None (Preliminary result)   Collection Time: 12/31/17 12:30 AM  Result Value Ref Range Status   Specimen Description BLOOD RIGHT UPPER ARM  Final   Special Requests   Final    BOTTLES DRAWN AEROBIC AND ANAEROBIC Blood Culture adequate volume   Culture   Final    NO GROWTH 4  DAYS Performed at [facility], [address] [place], [place] [address]    Report Status PENDING  Incomplete  Urine culture     Status: None   Collection Time: 12/31/17  1:13 AM  Result Value Ref Range Status   Specimen Description   Final    URINE, RANDOM Performed at [facility], [address] [place], [place] [address]    Special Requests   Final    NONE Performed at [facility], [address] [place], [place] [address]    Culture   Final    NO GROWTH Performed at [facility], [place] [address] [place], [place] [address]    Report Status 01/01/2018 FINAL  Final  Culture, blood (routine x 2)     Status: None (Preliminary result)   Collection Time: 12/31/17  2:34 AM  Result Value Ref Range Status   Specimen Description BLOOD RIGHT ANTECUBITAL  Final   Special Requests   Final    BOTTLES DRAWN AEROBIC AND ANAEROBIC Blood Culture results may not be optimal due to an excessive volume of blood received in culture bottles   Culture   Final    NO GROWTH 4 DAYS Performed at [facility], [address], [place], [place] [address]    Report Status PENDING  Incomplete  MRSA PCR Screening     Status: None   Collection Time: 12/31/17  4:43 AM  Result Value Ref Range Status   MRSA by PCR NEGATIVE NEGATIVE Final    Comment:        The GeneXpert MRSA Assay (FDA approved for NASAL specimens only), is one component of a comprehensive MRSA colonization surveillance program. It is not intended to diagnose MRSA infection nor to guide or monitor treatment for MRSA infections. Performed at [facility], [address] [place], [place] [address]      Scheduled Meds: . sodium chloride   Intravenous Once  . bumetanide  1 mg Intravenous Once  . Chlorhexidine Gluconate Cloth  6 each Topical Q0600  . Difluprednate  1 drop Left Eye BID  . feeding supplement (ENSURE ENLIVE)  237 mL Oral Daily  . gabapentin  100 mg Oral TID   . insulin  aspart  0-15 Units Subcutaneous TID WC  . insulin detemir  10 Units Subcutaneous Daily  . ipratropium-albuterol  3 mL Nebulization TID  . mouth rinse  15 mL Mouth Rinse BID  . methylPREDNISolone (SOLU-MEDROL) injection  40 mg Intravenous Q12H  . metoCLOPramide  5 mg Oral TID AC  . mometasone-formoterol  2 puff Inhalation BID  . multivitamin  1 tablet Oral QHS  . pravastatin  20 mg Oral q1800  . torsemide  100 mg Oral Once per day on Sun Tue Thu Sat   Continuous Infusions: . sodium chloride    . sodium chloride    . sodium chloride    . sodium chloride 250 mL (01/04/18 0849)  . famotidine (PEPCID) IV 20 mg (01/04/18 0850)  . furosemide    . magnesium sulfate 1 - 4 g bolus IVPB      Assessment/Plan:  1. Acute respiraotry distress, Chronic respiratory failure, transfer to step down for bipap.  CXR and abg ordered. Spoke with critical care team.  Spoke with nephrology and it will be a while for dialysis.  I will give 120 of lasix. Low grad temperature, cxr to determine if pneumonia. 2. Acute on chronic anemia.  Transfuse 1 unit of packed red blood cells today with dialysis on a hemoglobin of 7.2.  Received 3 units of packed red blood cells already. 3. Hyperkalemia.  Resolved 4. Recent diagnosis of DVT with transfusion dependent anemia.  Eliquis on hold.  IVC filter placed. 5. Type 2 diabetes mellitus on detemir insulin and sliding scale 6. History of bladder cancer and CLL. 7. Hyperlipidemia unspecified on pravastatin 8. End-stage renal disease on dialysis.  Requiring dialysis today. 9. History of stroke.  Unable to give any blood thinners 10. DNR already ordered 11. Weakness.  Physical therapy rec rehab 12. palliatve care consultation aprreciated  Code Status:     Code Status Orders  (From admission, onward)         Start     Ordered   12/31/17 0441  Do not attempt resuscitation (DNR)  Continuous    Question Answer Comment  In the event of cardiac or  respiratory ARREST Do not call a "code blue"   In the event of cardiac or respiratory ARREST Do not perform Intubation, CPR, defibrillation or ACLS   In the event of cardiac or respiratory ARREST Use medication by any route, position, wound care, and other measures to relive pain and suffering. May use oxygen, suction and manual treatment of airway obstruction as needed for comfort.      12/31/17 0440        Code Status History    Date Active Date Inactive Code Status Order ID Comments User Context   12/31/2017 0212 12/31/2017 0440 DNR [id number]  Arta Silence, MD ED   12/25/2017 0514 12/27/2017 1651 Partial Code [id number]  Harrie Foreman, MD ED   12/18/2017 1441 12/23/2017 1907 Partial Code [id number]  Asencion Gowda, NP Inpatient   01/15/2017 0423 01/18/2017 2120 Full Code [id number]  Saundra Shelling, MD Inpatient   10/20/2016 1627 10/22/2016 1817 Full Code [id number]  Lance Coon, MD Inpatient      Disposition Plan: TBD  Consultants:  Nephrology  Vascular surgery  Oncology  Time spent: 31 minutes  [place]

## 2018-01-04 NOTE — Progress Notes (Signed)
Per Hinton Dyer NP hold on Lasix that was ordered at this time. Patient will be getting dialysis. Called dialysis and awaiting an ETA.

## 2018-01-04 NOTE — Consult Note (Signed)
Name: Mark [name]. MRN: [id number] DOB: 06-11-1945    ADMISSION DATE:  12/30/2017  BRIEF PATIENT DESCRIPTION:  72 year old African-American male admitted to stepdown unit 09/30 with acute encephalopathy secondary to [place] and acute blood loss anemia, pulmonary edema and hyperkalemia. Pt transferred  back to stepdown unit 10/4 with acute on chronic hypoxic respiratory failure likely secondary to pulmonary edema requiring Bipap  SIGNIFICANT EVENTS  09/30-Pt admitted to stepdown unt  09/30-IVC filter placed by vascular due to recent right lower extremity nonocclusive DVT dx and unable to chemically anticoagulate due to severe anemia  10/2-Right chest porta cath placed per vascular 10/4-PCCM re-consulted pt transferred to stepdown unit with respiratory distress requiring Bipap   STUDIES:  CT Head 09/30>>negative  CULTURES: Blood x2 09/30>>negative  Urine 09/30>>negative  MRSA PCR 09/30>>negative   HISTORY OF PRESENT ILLNESS:  72 year old African-American male with a medical history as indicated below who presented to the ED via EMS with altered mental status and hyperglycemia.  History is obtained from ED records, patient and EMS records.  Per ED records, family initially felt that patient may have a UTI as patient was confused, and weak.  Family also indicated that he has been delusional and agitated at times.  At the ED, patient's labs showed hyperglycemia with a blood glucose level of 527 mg/dL, potassium of 6.6 and a hemoglobin of 6.0 down from 9.1 on 12/24/17. His CXR showed pulmonary edema and a small right pleural effusion.   He is being admitted to the ICU for management of hyperosmolar hyperglycemic state and acute blood loss anemia likely due to anticoagulation.  Per ED records, patient has had previous blood transfusions.  It is unclear if his low hemoglobin at this point is due to his chronic anemia due to acute acute blood loss. Of note, patient has been in and out of the  hospital multiple times at [facility].  He was recently diagnosed with right lower extremity DVT and started on Eliquis.  He goes for dialysis Monday Wednesday and Friday and had his treatment on Friday.  Patient makes very minimal urine.  He is on oxygen at home and reports compliance.   REVIEW OF SYSTEMS: Unable to assess pt on Bipap   SUBJECTIVE:  Unable to assess pt on Bipap   VITAL SIGNS: Temp:  [98.4 F (36.9 C)-100.5 F (38.1 C)] 100.5 F (38.1 C) (10/04 1033) Pulse Rate:  [84-107] 107 (10/04 1100) Resp:  [18-35] 27 (10/04 1100) BP: (93-162)/(70-117) 132/70 (10/04 1033) SpO2:  [78 %-100 %] 97 % (10/04 1107)  PHYSICAL EXAMINATION: General: acutely ill appearing male, NAD on Bipap  Neuro: alert and oriented, follows commands, left orbital erythema  HEENT: mild JVD present  Cardiovascular: sinus tach with intermittent PVC's, no R/G Lungs: faint crackles throughout, even, non labored  Abdomen: +BS x4, obese, soft, non tender, non distended  Musculoskeletal: normal bulk and tone, no edema  Skin: right chest porta cath in place accessed ecchymosis at site and upper right chest incision site ecchymotic   Recent Labs  Lab 12/31/17 0027 01/02/18 1904 01/04/18 0804  NA 135 140 138  K 6.6* 3.1* 3.7  CL 95* 98 95*  CO2 24 32 28  BUN 89* 25* 66*  CREATININE 7.42* 2.56* 6.44*  GLUCOSE 527* 110* 150*   Recent Labs  Lab 12/31/17 0027 12/31/17 1155 01/02/18 1904 01/03/18 2157  HGB 6.0* 7.4* 7.3* 7.2*  HCT 18.0*  --  20.5* 20.5*  WBC 6.2  --  5.2  --  PLT 141*  --  104*  --    Dg Chest Port 1 View  Result Date: 01/04/2018 CLINICAL DATA:  Short of breath on exertion EXAM: PORTABLE CHEST 1 VIEW COMPARISON:  12/30/2017 FINDINGS: Progressive bilateral airspace disease with basilar predominance. Progression of small right effusion. Interval placement of Port-A-Cath with the tip in the right atrium. No pneumothorax Port-A-Cath tip in the right atrium Progressive bilateral airspace  disease and small right effusion. IMPRESSION: No active disease. Electronically Signed   By: Franchot Gallo M.D.   On: 01/04/2018 11:10    ASSESSMENT / PLAN:  Acute on chronic hypoxic respiratory failure likely secondary to pulmonary edema in setting of ESRD Hx: COPD and CHF  Prn Bipap for dyspnea and/or hypoxia  Scheduled and prn bronchodilator therapy IV steroids  Nephrology consulted appreciate input-HD scheduled for today 10/4 Continue demadex Trend BMP  Replace electrolytes as indicated  Midodrine on dialysis days  Avoid nephrotoxic medications   Recent diagnosis of right lower extremity nonocclusive DVT s/p IVC filter placement 09/30 (eliquis on hold due to acute anemia)  Acute on chronic anemia  VTE px: SCD's avoid chemical prophylaxis  Trend CBC  Monitor for s/sx of bleeding and transfuse for hgb <8  CLL/SLL Oncology consulted appreciate input-per recommendations ibrutinib on hold   Uncontrolled Type II Diabetes Mellitus  Continue SSI and scheduled levemir  CBG's ac/hs   Palliative care consulted appreciate input  -Pts wife updated via telephone regarding change in pt condition requiring transfer to the stepdown unit and all questions answered  Marda Stalker, [place] Pager [phone] (please enter 7 digits) PCCM Consult Pager [phone] (please enter 7 digits)

## 2018-01-04 NOTE — Progress Notes (Signed)
Daily Progress Note   Patient Name: Mark [name].       Date: 01/04/2018 DOB: 08-11-1945  Age: 72 y.o. MRN#: [id number] Attending Physician: Loletha Grayer, MD Primary Care Physician: Albina Billet, MD Admit Date: 12/30/2017  Reason for Consultation/Follow-up: Psychosocial/spiritual support  Subjective: Patient moved to ICU secondary to SOB. Plans for dialysis today. He is resting in bed with BIPAP in place. He turns his head away and does not want to speak. No family at bedside.     Length of Stay: 4  Current Medications: Scheduled Meds:  . sodium chloride   Intravenous Once  . bumetanide  1 mg Intravenous Once  . chlorhexidine  15 mL Mouth Rinse BID  . Chlorhexidine Gluconate Cloth  6 each Topical Q0600  . Difluprednate  1 drop Left Eye BID  . feeding supplement (ENSURE ENLIVE)  237 mL Oral Daily  . gabapentin  100 mg Oral TID  . insulin aspart  0-15 Units Subcutaneous TID WC  . insulin detemir  10 Units Subcutaneous Daily  . ipratropium-albuterol  3 mL Nebulization TID  . mouth rinse  15 mL Mouth Rinse BID  . mouth rinse  15 mL Mouth Rinse q12n4p  . methylPREDNISolone (SOLU-MEDROL) injection  40 mg Intravenous Q12H  . metoCLOPramide  5 mg Oral TID AC  . mometasone-formoterol  2 puff Inhalation BID  . multivitamin  1 tablet Oral QHS  . pravastatin  20 mg Oral q1800  . torsemide  100 mg Oral Once per day on Sun Tue Thu Sat    Continuous Infusions: . sodium chloride    . sodium chloride    . sodium chloride    . sodium chloride 250 mL (01/04/18 1103)  . famotidine (PEPCID) IV 20 mg (01/04/18 0850)  . furosemide    . magnesium sulfate 1 - 4 g bolus IVPB 2 g (01/04/18 1105)    PRN Meds: sodium chloride, sodium chloride, sodium chloride, acetaminophen **OR**  acetaminophen, albuterol, alteplase, bisacodyl, dextrose, ipratropium-albuterol, lidocaine (PF), lidocaine-prilocaine, midodrine, ondansetron **OR** ondansetron (ZOFRAN) IV, pentafluoroprop-tetrafluoroeth, polyvinyl alcohol, senna-docusate, sodium chloride flush  Physical Exam  Constitutional: No distress.  Pulmonary/Chest: Effort normal.  Neurological: He is alert.            Vital Signs: BP 131/86 (BP Location: Right Leg)   Pulse [name])  102   Temp (!) 101.5 F (38.6 C) (Axillary)   Resp (!) 28   Ht 5\' 11"  (1.803 m)   Wt 91.7 kg   SpO2 99%   BMI 28.20 kg/m  SpO2: SpO2: 99 % O2 Device: O2 Device: Bi-PAP O2 Flow Rate: O2 Flow Rate (L/min): 6 L/min  Intake/output summary:   Intake/Output Summary (Last 24 hours) at 01/04/2018 1158 Last data filed at 01/04/2018 1026 Gross per 24 hour  Intake 573.93 ml  Output 0 ml  Net 573.93 ml   LBM: Last BM Date: 01/02/18(per pt) Baseline Weight: Weight: 74.8 kg Most recent weight: Weight: 91.7 kg       Palliative Assessment/Data:       Patient Active Problem List   Diagnosis Date Noted  . Symptomatic anemia 12/31/2017  . Acute on chronic respiratory failure with hypoxemia ([place]) 12/31/2017  . Hypoxia 12/25/2017  . Pressure injury of skin 12/16/2017  . Fluid overload 01/15/2017  . UTI (urinary tract infection) 10/20/2016  . Sepsis ([place]) 10/20/2016  . 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 Ulcer of foot, chronic ([place]) 03/21/2015  . History of bladder cancer 11/10/2014  . Penile bleeding 10/12/2014  . Dependence on renal dialysis ([place]) 08/18/2013  . Arterial blood pressure decreased 08/18/2013  . Leukemia ([place]) 08/18/2013  . Retina disorder 08/18/2013  . Breath shortness 08/18/2013  . End-stage renal disease on hemodialysis ([place]) 12/23/2012  . Absolute anemia 07/18/2012  . HPTH (hyperparathyroidism) ([place]) 07/18/2012  . Acidosis, metabolic 38/01/1750    . Hyperparathyroidism ([place]) 07/18/2012  . Abnormal presence of protein in urine 01/01/2012  . HLD (hyperlipidemia) 12/25/2011  . Diabetic retinopathy associated with type 2 diabetes mellitus ([place]) 10/26/1999  . Essential (primary) hypertension 04/04/1999  . Diabetes mellitus type 2, uncontrolled ([place]) 04/04/1999  . Type 2 diabetes mellitus with other diabetic kidney complication ([place]) 02/58/5277    Palliative Care Assessment & Plan   Recommendations/Plan:  Continue current care.     Code Status:    Code Status Orders  (From admission, onward)         Start     Ordered   12/31/17 0441  Do not attempt resuscitation (DNR)  Continuous    Question Answer Comment  In the event of cardiac or respiratory ARREST Do not call a "code blue"   In the event of cardiac or respiratory ARREST Do not perform Intubation, CPR, defibrillation or ACLS   In the event of cardiac or respiratory ARREST Use medication by any route, position, wound care, and other measures to relive pain and suffering. May use oxygen, suction and manual treatment of airway obstruction as needed for comfort.      12/31/17 0440        Code Status History    Date Active Date Inactive Code Status Order ID Comments User Context   12/31/2017 0212 12/31/2017 0440 DNR [id number]  Arta Silence, MD ED   12/25/2017 0514 12/27/2017 1651 Partial Code [id number]  Harrie Foreman, MD ED   12/18/2017 1441 12/23/2017 1907 Partial Code [id number]  Asencion Gowda, NP Inpatient   01/15/2017 0423 01/18/2017 2120 Full Code [id number]  Saundra Shelling, MD Inpatient   10/20/2016 1627 10/22/2016 1817 Full Code [id number]  Lance Coon, MD Inpatient    Advance Directive Documentation     Most Recent Value  Type of Advance Directive  Healthcare Power of Attorney  Pre-existing out of facility DNR order (yellow form or  pink MOST form)  -  "MOST" Form in Place?  -       Prognosis:   Poor.   Discharge Planning:  To Be  Determined   Thank you for allowing the Palliative Medicine Team to assist in the care of this patient.   Total Time 15 min Prolonged Time Billed  NO      Greater than 50%  of this time was spent counseling and coordinating care related to the above assessment and plan.  Asencion Gowda, NP  Please contact Palliative Medicine Team phone at [phone] for questions and concerns.

## 2018-01-04 NOTE — Progress Notes (Signed)
[facility]  Daily Progress Note   Subjective: Port-A-Cath placed on 01/02/18 by Dr. Delana Meyer. IVC Filter placed on 12/31/17 by Dr. Lucky Cowboy.  Ask by ICU to assess recently placed Port-A-Cath due to possible bruising to the area. Patient without complaint.   Objective: Vitals:   01/04/18 1107 01/04/18 1135 01/04/18 1200 01/04/18 1321  BP:  131/86 112/64   Pulse:  (!) 102 95   Resp:  (!) 28 19   Temp:  (!) 101.5 F (38.6 C)    TempSrc:  Axillary    SpO2: 97% 99% 100% 95%  Weight:  91.7 kg    Height:  5\' 11"  (1.803 m)      Intake/Output Summary (Last 24 hours) at 01/04/2018 1423 Last data filed at 01/04/2018 1026 Gross per 24 hour  Intake 480 ml  Output 0 ml  Net 480 ml   Physical Exam: A&Ox3, NAD CV: RRR Chest:   Port-A-Cath: Site around port erythematous. Neck incision erythematous. Non-tender, no  drainage, no swelling, no fluctuance, no ecchymosis noted tracking along tunnel path. Pulmonary: CTA Bilaterally Abdomen: Soft, Nontender, Nondistended, (+) Bowel Sounds Right Groin: Access site healing well. No signs of infection.  Vascular: warm, non-tender with mild edema   Laboratory: CBC    Component Value Date/Time   WBC 6.2 01/04/2018 0804   HGB 7.3 (L) 01/04/2018 0804   HGB 9.9 (L) 11/18/2013 1030   HCT 21.2 (L) 01/04/2018 0804   HCT 29.3 (L) 11/18/2013 1030   PLT 67 (L) 01/04/2018 0804   PLT 190 11/18/2013 1030   BMET    Component Value Date/Time   NA 138 01/04/2018 0804   NA 140 11/18/2013 1030   K 3.7 01/04/2018 0804   K 4.0 11/18/2013 1030   CL 95 (L) 01/04/2018 0804   CL 97 (L) 11/18/2013 1030   CO2 28 01/04/2018 0804   CO2 33 (H) 11/18/2013 1030   GLUCOSE 150 (H) 01/04/2018 0804   GLUCOSE 265 (H) 11/18/2013 1030   BUN 66 (H) 01/04/2018 0804   BUN 30 (H) 11/18/2013 1030   CREATININE 6.44 (H) 01/04/2018 0804   CREATININE 5.05 (H) 11/18/2013 1030   CALCIUM 6.2 (LL) 01/04/2018 0804   CALCIUM 8.6 11/18/2013 1030   GFRNONAA 8 (L)  01/04/2018 0804   GFRNONAA 11 (L) 11/18/2013 1030   GFRAA 9 (L) 01/04/2018 0804   GFRAA 13 (L) 11/18/2013 1030   Assessment/Planning: The patient is a 72 year old male s/p POD#3 Port-A-Cath Insertion - asked to elevate by ICU due to "bruising" - stable 1) Most likely inflammation vs bruising. Port is working well. No signs of cellulitis, infection, drainage, swelling. It is not uncommon to experience some inflammation to the area surrounding the port and the incision to the neck. No ecchymosis noted along tunneling track. Would continue to monitor at this point. No intervention indicated. Discussed with Dr. Delana Meyer.   Marcelle Overlie PA-C 01/04/2018 2:23 PM

## 2018-01-04 NOTE — Clinical Social Work Note (Signed)
CSW made aware earlier today that PT recommended SNF. Patient was talking to staff about which nursing facility to go to. CSW began a bed search and started authorization with [facility] however, patient transferred to ICU later in the day. CSW received an auth number from insurance: [id number]. Baker Janus with [facility] stated to call her once a bed had been accepted: [phone] ext: [phone]. Updated clinicals will need to be sent if patient remains through the weekend. Shela Leff MSW,LCSW [phone]

## 2018-01-04 NOTE — Progress Notes (Signed)
PT Cancellation Note  Patient Details Name: Mark [name]. MRN: [id number] DOB: 25-May-1945   Cancelled Treatment:     Pt not medically stable for tx right now, per RN is having difficulty with breathing. Pt not appropriate at this time will attempt at later time/date when pt is medically appropriate.  Algis Downs, SPT   Algis Downs 01/04/2018, 10:43 AM

## 2018-01-05 ENCOUNTER — Inpatient Hospital Stay: Payer: Medicare HMO

## 2018-01-05 LAB — BASIC METABOLIC PANEL
ANION GAP: 15 (ref 5–15)
BUN: 55 mg/dL — ABNORMAL HIGH (ref 8–23)
CO2: 28 mmol/L (ref 22–32)
Calcium: 6.6 mg/dL — ABNORMAL LOW (ref 8.9–10.3)
Chloride: 94 mmol/L — ABNORMAL LOW (ref 98–111)
Creatinine, Ser: 5.09 mg/dL — ABNORMAL HIGH (ref 0.61–1.24)
GFR, EST AFRICAN AMERICAN: 12 mL/min — AB (ref >60)
GFR, EST NON AFRICAN AMERICAN: 10 mL/min — AB (ref >60)
GLUCOSE: 339 mg/dL — AB (ref 70–99)
POTASSIUM: 4.2 mmol/L (ref 3.5–5.1)
Sodium: 137 mmol/L (ref 135–145)

## 2018-01-05 LAB — CBC WITH DIFFERENTIAL/PLATELET
BASOS ABS: 0 10*3/uL (ref 0–0.1)
Basophils Relative: 0 %
EOS PCT: 0 %
Eosinophils Absolute: 0 10*3/uL (ref 0–0.7)
HCT: 23.7 % — ABNORMAL LOW (ref 40.0–52.0)
Hemoglobin: 8 g/dL — ABNORMAL LOW (ref 13.0–18.0)
LYMPHS ABS: 0.4 10*3/uL — AB (ref 1.0–3.6)
LYMPHS PCT: 7 %
MCH: 30.9 pg (ref 26.0–34.0)
MCHC: 33.5 g/dL (ref 32.0–36.0)
MCV: 92.1 fL (ref 80.0–100.0)
MONOS PCT: 1 %
Monocytes Absolute: 0 10*3/uL — ABNORMAL LOW (ref 0.2–1.0)
NEUTROS ABS: 4.5 10*3/uL (ref 1.4–6.5)
NEUTROS PCT: 92 %
PLATELETS: 59 10*3/uL — AB (ref 150–440)
RBC: 2.57 MIL/uL — AB (ref 4.40–5.90)
RDW: 16.3 % — ABNORMAL HIGH (ref 11.5–14.5)
WBC: 4.9 10*3/uL (ref 3.8–10.6)

## 2018-01-05 LAB — CULTURE, BLOOD (ROUTINE X 2)
Culture: NO GROWTH
Culture: NO GROWTH
Special Requests: ADEQUATE

## 2018-01-05 LAB — GLUCOSE, CAPILLARY
GLUCOSE-CAPILLARY: 336 mg/dL — AB (ref 70–99)
GLUCOSE-CAPILLARY: 356 mg/dL — AB (ref 70–99)
Glucose-Capillary: 293 mg/dL — ABNORMAL HIGH (ref 70–99)
Glucose-Capillary: 313 mg/dL — ABNORMAL HIGH (ref 70–99)
Glucose-Capillary: 408 mg/dL — ABNORMAL HIGH (ref 70–99)
Glucose-Capillary: 347 mg/dL — ABNORMAL HIGH (ref 70–99)

## 2018-01-05 LAB — PROCALCITONIN: PROCALCITONIN: 1.58 ng/mL

## 2018-01-05 MED ORDER — SALINE SPRAY 0.65 % NA SOLN
1.0000 | NASAL | Status: DC | PRN
  Administered 2018-01-05: 1 via NASAL
  Filled 2018-01-05: qty 44

## 2018-01-05 MED ORDER — EPOETIN ALFA 10000 UNIT/ML IJ SOLN
10000.0000 [IU] | INTRAMUSCULAR | Status: DC
  Administered 2018-01-07: 10000 [IU] via INTRAVENOUS
  Filled 2018-01-05: qty 1

## 2018-01-05 MED ORDER — OXYMETAZOLINE HCL 0.05 % NA SOLN
1.0000 | Freq: Two times a day (BID) | NASAL | Status: AC
  Administered 2018-01-05 – 2018-01-07 (×3): 1 via NASAL
  Filled 2018-01-05: qty 15

## 2018-01-05 MED ORDER — SODIUM CHLORIDE 0.9 % IV SOLN
1.0000 g | INTRAVENOUS | Status: DC
  Administered 2018-01-05 – 2018-01-08 (×4): 1 g via INTRAVENOUS
  Filled 2018-01-05 (×5): qty 1

## 2018-01-05 MED ORDER — INSULIN ASPART 100 UNIT/ML ~~LOC~~ SOLN
18.0000 [IU] | Freq: Once | SUBCUTANEOUS | Status: AC
  Administered 2018-01-05: 18 [IU] via SUBCUTANEOUS
  Filled 2018-01-05: qty 1

## 2018-01-05 MED ORDER — LACTULOSE 10 GM/15ML PO SOLN
10.0000 g | Freq: Two times a day (BID) | ORAL | Status: DC | PRN
  Administered 2018-01-05: 10 g via ORAL
  Filled 2018-01-05: qty 30

## 2018-01-05 MED ORDER — SALINE SPRAY 0.65 % NA SOLN
1.0000 | NASAL | Status: DC | PRN
  Administered 2018-01-07: 15:00:00 1 via NASAL
  Filled 2018-01-05: qty 44

## 2018-01-05 NOTE — Clinical Social Work Note (Signed)
Clinical Social Work Assessment  Patient Details  Name: Mark [name]. MRN: [id number] Date of Birth: 08/12/1945  Date of referral:  01/05/18               Reason for consult:  Facility Placement                Permission sought to share information with:  Chartered certified accountant granted to share information::  Yes, Verbal Permission Granted  Name::        Agency::  [facility]  Relationship::     Contact Information:     Housing/Transportation Living arrangements for the past 2 months:  [place] of Information:  Patient, Medical Team Patient Interpreter Needed:  None Criminal Activity/Legal Involvement Pertinent to Current Situation/Hospitalization:  No - Comment as needed Significant Relationships:  Adult Children, Spouse Lives with:  Spouse Do you feel safe going back to the place where you live?  Yes Need for family participation in patient care:  No (Coment)  Care giving concerns:  PT recommendation for SNF   Social Worker assessment / plan:  The CSW met with the patient at bedside. The CSW introduced self at which time the patient began complaining about a nosebleed and not having medications delivered by 11:45 am. The CSW attempted to assist the patient is relaxation activities as he had his [place] in his mouth rather than in his nostrils. The patient's RN entered the room during this time, at which point the patient began raising his voice at the RN apropos to no trigger. The CSW provided a bedside intervention to assist the client in understanding more appropriate communication tactics with the medical team to self-advocate. Once the patient had calmed, the CSW provided information about skilled nursing.The patient provided a handwritten list of locations he would consider ([place], [place], [place], and [facility]). The CSW advised the patient that [facility] provided a bed offer. The patient  shared that he is concerned about making sure he is able to attend his regular dialysis. The CSW advised that at the day of discharge, all dialysis plans would be in place, and if he discharges on a scheduled dialysis day, he would receive dialysis prior to leaving the hospital. The patient asked the CSW to visit again so that he can discuss the plans with his family.  The CSW will continue to follow the patient for discharge. The patient's previous CSW has already begun the [facility] auth process and received authorization [phone]. Baker Janus with [facility] stated to call her once a bed had been accepted: [phone] ext: [phone]. The CSW will send any updated clinical information should any be present over the weekend.   Employment status:  Retired Nurse, adult PT Recommendations:  [place] / Referral to community resources:  [place]  Patient/Family's Response to care:  The patient was irate, but did calm with the CSW. The patient did not seem compliant with his o2 [place] use, and he appeared to resent the RN for advising him to wear his o2 properly.   Patient/Family's Understanding of and Emotional Response to Diagnosis, Current Treatment, and Prognosis:  The patient is in agreement with SNF and is making decisions about his options.  Emotional Assessment Appearance:  Appears stated age Attitude/Demeanor/Rapport:  Aggressive (Verbally and/or physically), Complaining Affect (typically observed):  Agitated, Angry, Frustrated Orientation:  Oriented to Self, Oriented to Place, Oriented to  Time, Oriented to Situation Alcohol / Substance use:  Never Used Psych involvement (Current and /or in the community):  No (Comment)  Discharge Needs  Concerns to be addressed:  Care Coordination, Discharge Planning Concerns Readmission within the last 30 days:  Yes Current discharge risk:  Chronically ill Barriers to Discharge:  Continued  Medical Work up   [employer], LCSW 01/05/2018, 2:40 PM

## 2018-01-05 NOTE — Progress Notes (Signed)
Patient ID: Regenia Skeeter., male   DOB: 1945/12/29, 72 y.o.   MRN: [id number]     [facility] PROGRESS NOTE  Laretta Alstrom [name]. JHE:[id number] DOB: Oct 13, 1945 DOA: 12/30/2017 PCP: Albina Billet, MD  HPI/Subjective: Patient doing better still has shortness of breath  Objective: Vitals:   01/05/18 1133 01/05/18 1144  BP: 139/81   Pulse: 84   Resp:    Temp: 98.2 F (36.8 C)   SpO2: (!) 88% 92%    Filed Weights   01/02/18 2104 01/03/18 0428 01/04/18 1135  Weight: 92.7 kg 92.1 kg 91.7 kg    ROS: Review of Systems  Constitutional: Positive for malaise/fatigue. Negative for chills and fever.  Eyes: Negative for blurred vision.  Respiratory: Positive for cough and shortness of breath.   Cardiovascular: Negative for chest pain.  Gastrointestinal: Negative for abdominal pain, constipation, diarrhea, nausea and vomiting.  Genitourinary: Negative for dysuria.  Musculoskeletal: Negative for joint pain.  Neurological: Negative for dizziness and headaches.   Exam: Physical Exam  Constitutional: He is oriented to person, place, and time.  HENT:  Nose: No mucosal edema.  Mouth/Throat: No oropharyngeal exudate or posterior oropharyngeal edema.  Eyes: Pupils are equal, round, and reactive to light. Conjunctivae, EOM and lids are normal.  Neck: No JVD present. Carotid bruit is not present. No edema present. No thyroid mass and no thyromegaly present.  Cardiovascular: S1 normal and S2 normal. Exam reveals no gallop.  No murmur heard. Pulses:      Dorsalis pedis pulses are 2+ on the right side, and 2+ on the left side.  Respiratory: No respiratory distress. He has decreased breath sounds in the right lower field and the left lower field. He has no wheezes. He has no rhonchi. He has no rales.  GI: Soft. Bowel sounds are normal. There is no tenderness.  Musculoskeletal:       Right ankle: He exhibits no swelling.       Left ankle: He exhibits no swelling.  Lymphadenopathy:    He has no cervical adenopathy.  Neurological: He is alert and oriented to person, place, and time. No cranial nerve deficit.  Skin: Skin is warm. No rash noted. Nails show no clubbing.  Right groin with pressure dressing still on it.  Psychiatric: He has a normal mood and affect.      Data Reviewed: Basic Metabolic Panel: Recent Labs  Lab 12/31/17 0027 12/31/17 0436 12/31/17 0946 01/02/18 1904 01/04/18 0804 01/04/18 1526 01/04/18 1850 01/05/18 0701  NA 135  --   --  140 138  --   --  137  K 6.6*  --   --  3.1* 3.7  --  3.3* 4.2  CL 95*  --   --  98 95*  --   --  94*  CO2 24  --   --  32 28  --   --  28  GLUCOSE 527*  --   --  110* 150*  --   --  339*  BUN 89*  --   --  25* 66*  --   --  55*  CREATININE 7.42*  --   --  2.56* 6.44*  --   --  5.09*  CALCIUM 6.7*  --   --  7.3* 6.2*  --   --  6.6*  MG  --  2.7*  --   --   --   --  1.7  --   PHOS  --  4.7*  4.4 2.1*  --  3.7 2.4*  --    Liver Function Tests: Recent Labs  Lab 12/31/17 0027 01/02/18 1904  AST 20  --   ALT 24  --   ALKPHOS 76  --   BILITOT 1.0  --   PROT 5.6*  --   ALBUMIN 3.5 3.0*   CBC: Recent Labs  Lab 12/31/17 0027 12/31/17 1155 01/02/18 1904 01/03/18 2157 01/04/18 0804 01/05/18 0701  WBC 6.2  --  5.2  --  6.2 4.9  NEUTROABS 5.9  --   --   --   --  4.5  HGB 6.0* 7.4* 7.3* 7.2* 7.3* 8.0*  HCT 18.0*  --  20.5* 20.5* 21.2* 23.7*  MCV 98.8  --  92.1  --  90.9 92.1  PLT 141*  --  104*  --  67* 59*   Cardiac Enzymes: Recent Labs  Lab 12/31/17 0027  TROPONINI 0.03*    CBG: Recent Labs  Lab 01/04/18 1727 01/04/18 1729 01/04/18 1947 01/05/18 0721 01/05/18 0900  GLUCAP 194* 189* 200* 336* 356*    Recent Results (from the past 240 hour(s))  Culture, blood (routine x 2)     Status: None   Collection Time: 12/31/17 12:30 AM  Result Value Ref Range Status   Specimen Description BLOOD RIGHT UPPER ARM  Final   Special Requests   Final    BOTTLES DRAWN AEROBIC AND ANAEROBIC Blood Culture  adequate volume   Culture   Final    NO GROWTH 5 DAYS Performed at [facility], [address], [place], [place] [address]    Report Status 01/05/2018 FINAL  Final  Urine culture     Status: None   Collection Time: 12/31/17  1:13 AM  Result Value Ref Range Status   Specimen Description   Final    URINE, RANDOM Performed at [facility], [address], [place], [place] [address]    Special Requests   Final    NONE Performed at [facility], [address], [place], [place] [address]    Culture   Final    NO GROWTH Performed at [facility], [place] [address] [place], [place] [address]    Report Status 01/01/2018 FINAL  Final  Culture, blood (routine x 2)     Status: None   Collection Time: 12/31/17  2:34 AM  Result Value Ref Range Status   Specimen Description BLOOD RIGHT ANTECUBITAL  Final   Special Requests   Final    BOTTLES DRAWN AEROBIC AND ANAEROBIC Blood Culture results may not be optimal due to an excessive volume of blood received in culture bottles   Culture   Final    NO GROWTH 5 DAYS Performed at [facility], [address] [place], [place] [address]    Report Status 01/05/2018 FINAL  Final  MRSA PCR Screening     Status: None   Collection Time: 12/31/17  4:43 AM  Result Value Ref Range Status   MRSA by PCR NEGATIVE NEGATIVE Final    Comment:        The GeneXpert MRSA Assay (FDA approved for NASAL specimens only), is one component of a comprehensive MRSA colonization surveillance program. It is not intended to diagnose MRSA infection nor to guide or monitor treatment for MRSA infections. Performed at [facility], [address] [place], [place] [address]      Scheduled Meds: . sodium chloride   Intravenous Once  . bumetanide  1 mg Intravenous Once  . chlorhexidine  15 mL Mouth Rinse BID  . Chlorhexidine Gluconate Cloth  6 each Topical Q0600  . Difluprednate  1 drop Left Eye BID  . [START ON  01/07/2018] epoetin (EPOGEN/PROCRIT) injection  10,000 Units Intravenous Q M,W,F-HD  . feeding supplement (ENSURE ENLIVE)  237 mL Oral Daily  . gabapentin  100 mg Oral TID  . insulin aspart  0-15 Units Subcutaneous TID WC  . insulin detemir  10 Units Subcutaneous Daily  . ipratropium-albuterol  3 mL Nebulization TID  . mouth rinse  15 mL Mouth Rinse BID  . mouth rinse  15 mL Mouth Rinse q12n4p  . methylPREDNISolone (SOLU-MEDROL) injection  40 mg Intravenous Q12H  . metoCLOPramide  5 mg Oral TID AC  . mometasone-formoterol  2 puff Inhalation BID  . multivitamin  1 tablet Oral QHS  . pravastatin  20 mg Oral q1800  . torsemide  100 mg Oral Once per day on Sun Tue Thu Sat   Continuous Infusions: . sodium chloride    . sodium chloride    . sodium chloride    . sodium chloride 5 mL/hr at 01/05/18 0800  . famotidine (PEPCID) IV 20 mg (01/05/18 0916)    Assessment/Plan:  1. Acute on chronic anemia.  Status post transfusion hemoglobin stable 2. hyperkalemia.  Resolved 3. Recent diagnosis of DVT with transfusion dependent anemia.  Eliquis on hold.  IVC filter placed. 4. Respiratory failure possible pneumonia we will check a procalcitonin treat with IV cefepime, fluid removal per nephrology 5. type 2 diabetes mellitus on detemir insulin and sliding scale 6. History of bladder cancer and CLL. 7. Hyperlipidemia unspecified on pravastatin 8. End-stage renal disease on dialysis 9. History of stroke.  Unable to give any blood thinners 10. DNR    Code Status:     Code Status Orders  (From admission, onward)         Start     Ordered   12/31/17 0441  Do not attempt resuscitation (DNR)  Continuous    Question Answer Comment  In the event of cardiac or respiratory ARREST Do not call a "code blue"   In the event of cardiac or respiratory ARREST Do not perform Intubation, CPR, defibrillation or ACLS   In the event of cardiac or respiratory ARREST Use medication by any route, position, wound  care, and other measures to relive pain and suffering. May use oxygen, suction and manual treatment of airway obstruction as needed for comfort.      12/31/17 0440        Code Status History    Date Active Date Inactive Code Status Order ID Comments User Context   12/31/2017 0212 12/31/2017 0440 DNR [id number]  Arta Silence, MD ED   12/25/2017 0514 12/27/2017 1651 Partial Code [id number]  Harrie Foreman, MD ED   12/18/2017 1441 12/23/2017 1907 Partial Code [id number]  Asencion Gowda, NP Inpatient   01/15/2017 0423 01/18/2017 2120 Full Code [id number]  Saundra Shelling, MD Inpatient   10/20/2016 1627 10/22/2016 1817 Full Code [id number]  Lance Coon, MD Inpatient      Disposition Plan: Patient now interested in rehab  Consultants:  Nephrology  Vascular surgery  Oncology  Time spent: 24 minutes  Chaitanya Amedee [facility]

## 2018-01-05 NOTE — Progress Notes (Signed)
Patient's blood sugar 408, notified Dr. Estanislado Pandy on call, received verbal order for 18 units once of novolog.

## 2018-01-05 NOTE — Progress Notes (Signed)
Maggie, NP notified of potassium at 3.3 and phosphorus at 2.4. Acknowledged. See new orders.

## 2018-01-05 NOTE — Progress Notes (Signed)
Patient c/o nose bleed due to dryness r/t oxygen, requesting saline mist spray. Verbal order received from [facility], NP for saline nasal spray PRN.

## 2018-01-05 NOTE — Plan of Care (Signed)
  Problem: Education: Goal: Knowledge of General Education information will improve Description Including pain rating scale, medication(s)/side effects and non-pharmacologic comfort measures Outcome: Progressing   Problem: Health Behavior/Discharge Planning: Goal: Ability to manage health-related needs will improve Outcome: Progressing   Problem: Clinical Measurements: Goal: Ability to maintain clinical measurements within normal limits will improve Outcome: Progressing Goal: Will remain free from infection Outcome: Progressing Goal: Diagnostic test results will improve Outcome: Progressing Goal: Respiratory complications will improve Outcome: Progressing   Problem: Activity: Goal: Risk for activity intolerance will decrease Outcome: Progressing   Problem: Elimination: Goal: Will not experience complications related to bowel motility Outcome: Progressing Goal: Will not experience complications related to urinary retention Outcome: Progressing   Problem: Pain Managment: Goal: General experience of comfort will improve Outcome: Progressing   Problem: Safety: Goal: Ability to remain free from injury will improve Outcome: Progressing

## 2018-01-05 NOTE — Progress Notes (Signed)
Pt AAOX4, normal sinus rhythm. Stable VSs. Right groin incision dry and intact. No complaint for SOB or chest pain. Remained on nasal canula. On cardiac monitoring.

## 2018-01-05 NOTE — Progress Notes (Addendum)
Follow up - Critical Care Medicine Note  Patient Details:    Mark Pohle. is an 72 y.o. male.admittedto stepdown unit 09/30with acute encephalopathy secondary toHHNKand acuteblood lossanemia, pulmonary edema and hyperkalemia.Pt transferred back to stepdown unit 10/4 with acute on chronic hypoxic respiratory failure likely secondary to pulmonary edema requiring Bipap and dialysis   Lines, Airways, Drains: Implanted Port 01/02/18 Right Chest (Active)  Site Assessment Ecchymotic;Red;Tender;Dry;Intact 01/04/2018  8:00 PM  Port Intervention Accessed 01/04/2018  8:00 PM  Needle Size PH 19 gauge 01/03/2018  8:29 AM  Line Status Infusing;Flushed;Blood return noted 01/04/2018  8:00 PM  Dressing Type Transparent;Occlusive 01/04/2018  8:00 PM  Dressing Status Clean;Dry;Intact;Antimicrobial disc in place 01/04/2018  8:00 PM  Dressing Intervention New dressing 01/03/2018  8:29 AM  Needle Change Due 01/10/18 01/04/2018  8:00 PM    Anti-infectives:  Anti-infectives (From admission, onward)   Start     Dose/Rate Route Frequency Ordered Stop   01/02/18 1300  ceFAZolin (ANCEF) IVPB 2g/100 mL premix  Status:  Discontinued     2 g 200 mL/hr over 30 Minutes Intravenous  Once 01/02/18 1255 01/02/18 1341   01/02/18 1254  ceFAZolin (ANCEF) IVPB 2g/100 mL premix  Status:  Discontinued     2 g 200 mL/hr over 30 Minutes Intravenous 30 min pre-op 01/02/18 1254 01/02/18 1341   12/31/17 1515  ceFAZolin (ANCEF) IVPB 1 g/50 mL premix     1 g 100 mL/hr over 30 Minutes Intravenous  Once 12/31/17 1506 12/31/17 1730      Microbiology: Results for orders placed or performed during the hospital encounter of 12/30/17  Culture, blood (routine x 2)     Status: None   Collection Time: 12/31/17 12:30 AM  Result Value Ref Range Status   Specimen Description BLOOD RIGHT UPPER ARM  Final   Special Requests   Final    BOTTLES DRAWN AEROBIC AND ANAEROBIC Blood Culture adequate volume   Culture   Final    NO GROWTH  5 DAYS Performed at [facility], [address] [place], [place] [address]    Report Status 01/05/2018 FINAL  Final  Urine culture     Status: None   Collection Time: 12/31/17  1:13 AM  Result Value Ref Range Status   Specimen Description   Final    URINE, RANDOM Performed at [facility], [address], [place], [place] [address]    Special Requests   Final    NONE Performed at [facility], [address], [place], [place] [address]    Culture   Final    NO GROWTH Performed at [facility], [place] [address], [place], [place] [address]    Report Status 01/01/2018 FINAL  Final  Culture, blood (routine x 2)     Status: None   Collection Time: 12/31/17  2:34 AM  Result Value Ref Range Status   Specimen Description BLOOD RIGHT ANTECUBITAL  Final   Special Requests   Final    BOTTLES DRAWN AEROBIC AND ANAEROBIC Blood Culture results may not be optimal due to an excessive volume of blood received in culture bottles   Culture   Final    NO GROWTH 5 DAYS Performed at [facility], [address] [place], [place] [address]    Report Status 01/05/2018 FINAL  Final  MRSA PCR Screening     Status: None   Collection Time: 12/31/17  4:43 AM  Result Value Ref Range Status   MRSA  by PCR NEGATIVE NEGATIVE Final    Comment:        The GeneXpert MRSA Assay (FDA approved for NASAL specimens only), is one component of a comprehensive MRSA colonization surveillance program. It is not intended to diagnose MRSA infection nor to guide or monitor treatment for MRSA infections. Performed at [facility], [address], [place], [place] [address]    Studies: Dg Chest 2 View  Result Date: 12/24/2017 CLINICAL DATA:  Acute onset shortness of breath. EXAM: CHEST - 2 VIEW COMPARISON:  12/18/2017 and 12/15/2017 FINDINGS: Lungs are adequately inflated demonstrate a small right-sided pleural effusion likely with associated right basilar  atelectasis unchanged. Interval improved left base opacification. Cardiomediastinal silhouette and remainder of the exam is unchanged. IMPRESSION: Stable small right pleural effusion likely with associated right basilar atelectasis. Near resolution of left base opacification. Electronically Signed   By: Marin Olp M.D.   On: 12/24/2017 21:43   Ct Head Wo Contrast  Result Date: 12/31/2017 CLINICAL DATA:  Confusion, combative. Headache. On Eliquis. History of lymphoma, end-stage renal disease on dialysis, hypertension, stroke. EXAM: CT HEAD WITHOUT CONTRAST TECHNIQUE: Contiguous axial images were obtained from the base of the skull through the vertex without intravenous contrast. COMPARISON:  CT HEAD October 15, 2013 FINDINGS: BRAIN: No intraparenchymal hemorrhage, mass effect nor midline shift. The ventricles and sulci are normal for age. Minimal supratentorial white matter hypodensities less than expected for patient's age, though non-specific are most compatible with chronic small vessel ischemic disease. No acute large vascular territory infarcts. No abnormal extra-axial fluid collections. Basal cisterns are patent. VASCULAR: Moderate to severe calcific atherosclerosis of the carotid siphons. SKULL: No skull fracture. No significant scalp soft tissue swelling. SINUSES/ORBITS: Trace paranasal sinus mucosal thickening. Mastoid air cells are well aerated.The included ocular globes and orbital contents are non-suspicious. Status post bilateral ocular lens implants. OTHER: None. IMPRESSION: Normal noncontrast CT HEAD for age. Electronically Signed   By: Elon Alas M.D.   On: 12/31/2017 01:13   Nm Pulmonary Perf And Vent  Result Date: 12/25/2017 CLINICAL DATA:  Chest pain. EXAM: NUCLEAR MEDICINE VENTILATION - PERFUSION LUNG SCAN TECHNIQUE: Ventilation images were obtained in multiple projections using inhaled aerosol Tc-39m DTPA. Perfusion images were obtained in multiple projections after intravenous  injection of Tc-58m-MAA. RADIOPHARMACEUTICALS:  34.4 mCi of Tc-82m DTPA aerosol inhalation and 4.3 mCi Tc56m-MAA IV COMPARISON:  Chest x-ray 12/24/2017. FINDINGS: No ventilation perfusion mismatches noted. Mild ventilatory defects and perfusion defects are noted within the tori defects being larger. These are primarily in the bases and most likely related to previously identified basilar atelectasis and pleural effusions. IMPRESSION: Low probability pulmonary embolus. Electronically Signed   By: Marcello Moores  Register   On: 12/25/2017 11:58   US Venous Img Lower Bilateral  Result Date: 12/25/2017 CLINICAL DATA:  Bilateral lower extremity pain/cramping for 1 day with dyspnea. EXAM: BILATERAL LOWER EXTREMITY VENOUS DOPPLER ULTRASOUND TECHNIQUE: Gray-scale sonography with graded compression, as well as color Doppler and duplex ultrasound were performed to evaluate the lower extremity deep venous systems from the level of the common femoral vein and including the common femoral, femoral, profunda femoral, popliteal and calf veins including the posterior tibial, peroneal and gastrocnemius veins when visible. The superficial great saphenous vein was also interrogated. Spectral Doppler was utilized to evaluate flow at rest and with distal augmentation maneuvers in the common femoral, femoral and popliteal veins. COMPARISON:  Left lower extremity venous Doppler scan dated 01/15/2017 FINDINGS: RIGHT LOWER EXTREMITY Common Femoral Vein: No evidence  of thrombus. Normal compressibility, respiratory phasicity and response to augmentation. Saphenofemoral Junction: No evidence of thrombus. Normal compressibility and flow on color Doppler imaging. Profunda Femoral Vein: No evidence of thrombus. Normal compressibility and flow on color Doppler imaging. Femoral Vein: Nonocclusive thrombosis of the midportion of the right femoral vein. Patent compressible proximal and distal portions of the right femoral vein. Popliteal Vein: No  evidence of thrombus. Normal compressibility, respiratory phasicity and response to augmentation. Calf Veins: No evidence of thrombus. Normal compressibility and flow on color Doppler imaging. Superficial Great Saphenous Vein: No evidence of thrombus. Normal compressibility. Venous Reflux:  None. Other Findings:  None. LEFT LOWER EXTREMITY Common Femoral Vein: No evidence of thrombus. Normal compressibility, respiratory phasicity and response to augmentation. Saphenofemoral Junction: No evidence of thrombus. Normal compressibility and flow on color Doppler imaging. Profunda Femoral Vein: No evidence of thrombus. Normal compressibility and flow on color Doppler imaging. Femoral Vein: No evidence of thrombus. Normal compressibility, respiratory phasicity and response to augmentation. Popliteal Vein: No evidence of thrombus. Normal compressibility, respiratory phasicity and response to augmentation. Calf Veins: No evidence of thrombus. Normal compressibility and flow on color Doppler imaging. Superficial Great Saphenous Vein: No evidence of thrombus. Normal compressibility. Venous Reflux:  None. Other Findings:  None. IMPRESSION: 1. Nonocclusive deep venous thrombosis of the midportion of the right femoral vein. 2. Otherwise patent deep veins of the lower extremities bilaterally. Electronically Signed   By: Ilona Sorrel M.D.   On: 12/25/2017 03:00   Dg Chest Port 1 View  Result Date: 01/05/2018 CLINICAL DATA:  Shortness of breath, respiratory failure EXAM: PORTABLE CHEST 1 VIEW COMPARISON:  01/04/2018 FINDINGS: Right Port-A-Cath remains in place, unchanged. Diffuse bilateral airspace opacities and small right effusion, stable since prior study. Heart is borderline in size. IMPRESSION: Stable diffuse bilateral airspace opacities and small right effusion. Electronically Signed   By: Rolm Baptise M.D.   On: 01/05/2018 08:25   Dg Chest Port 1 View  Result Date: 01/04/2018 CLINICAL DATA:  Short of breath on exertion  EXAM: PORTABLE CHEST 1 VIEW COMPARISON:  12/30/2017 FINDINGS: Progressive bilateral airspace disease with basilar predominance. Progression of small right effusion. Interval placement of Port-A-Cath with the tip in the right atrium. No pneumothorax Port-A-Cath tip in the right atrium Progressive bilateral airspace disease and small right effusion. IMPRESSION: No active disease. Electronically Signed   By: Franchot Gallo M.D.   On: 01/04/2018 11:10   Dg Chest Port 1 View  Result Date: 12/30/2017 CLINICAL DATA:  Weakness. EXAM: PORTABLE CHEST 1 VIEW COMPARISON:  Most recent radiograph 12/24/2017, 12/18/2017. Additional priors. FINDINGS: Low lung volumes. Increased pulmonary edema from prior exam. Small right pleural effusion is grossly unchanged. Streaky left lung base atelectasis. Mild cardiomegaly is similar to priors. No pneumothorax. IMPRESSION: Increased pulmonary edema from prior exam. Small right pleural effusion is similar. Electronically Signed   By: Keith Rake M.D.   On: 12/30/2017 23:50   Dg Chest Port 1 View  Result Date: 12/18/2017 CLINICAL DATA:  Shortness of breath. EXAM: PORTABLE CHEST 1 VIEW COMPARISON:  Radiograph of December 15, 2017. FINDINGS: The heart size and mediastinal contours are within normal limits. Stable bilateral pulmonary edema is noted. Mild bilateral pleural effusions are noted which are increased compared to prior exam. No pneumothorax is noted. Atherosclerosis of thoracic aorta is noted. The visualized skeletal structures are unremarkable. IMPRESSION: Bilateral pulmonary edema with mild bilateral pleural effusions. Aortic Atherosclerosis (ICD10-I70.0). Electronically Signed   By: Marijo Conception, M.D.  On: 12/18/2017 15:10   Dg Chest Port 1 View  Result Date: 12/15/2017 CLINICAL DATA:  Altered mental status with cough. EXAM: PORTABLE CHEST 1 VIEW COMPARISON:  01/15/2017 and PET-CT 11/30/2016 FINDINGS: Lungs are adequately inflated and demonstrate hazy prominence  of the perihilar markings likely interstitial edema. No effusion. Cardiomediastinal silhouette and remainder the exam is unchanged. IMPRESSION: Findings suggesting mild interstitial edema. Electronically Signed   By: Marin Olp M.D.   On: 12/15/2017 21:06    Consults: Treatment Team:  Arta Silence, MD Anthonette Legato, MD Cammie Sickle, MD [facility], [facility], MD Hermelinda Dellen, DO   Subjective:    Overnight Issues: Patient did well post dialysis, has been weaned off of BiPAP, down to nasal cannula, has had a mild nosebleed this morning  Objective:  Vital signs for last 24 hours: Temp:  [98 F (36.7 C)-101.5 F (38.6 C)] 98 F (36.7 C) (10/05 0700) Pulse Rate:  [67-107] 89 (10/05 0948) Resp:  [11-35] 19 (10/05 0948) BP: (78-148)/(48-135) 139/90 (10/05 0948) SpO2:  [78 %-100 %] 100 % (10/05 0948) FiO2 (%):  [40 %] 40 % (10/05 0745) Weight:  [91.7 kg] 91.7 kg (10/04 1135)  Hemodynamic parameters for last 24 hours:    Intake/Output from previous day: 10/04 0701 - 10/05 0700 In: 485.3 [P.O.:240; I.V.:195.3; IV Piggyback:50] Out: 1040   Intake/Output this shift: Total I/O In: 306.8 [P.O.:300; I.V.:6.8] Out: -   Vent settings for last 24 hours: FiO2 (%):  [40 %] 40 %  Physical Exam:  Vital signs: Please see the above listed vital signs HEENT: Trachea midline, no thyromegaly appreciated Cardiovascular: Regular rate and rhythm Pulmonary: Clear to auscultation Abdominal: Positive bowel sounds Extremities: No clubbing cyanosis or edema noted Neurologic: No focal deficits appreciated  Assessment/Plan:   Respiratory failure.  Patient is doing significantly better, presently weaned off of BiPAP, weaning on FiO2, status post hemodialysis.  We will continue bronchodilators  Recently diagnosed DVT, nonocclusive, status post IVC filter  History of CLL.  Hemoglobin is 8, hematocrit 23.7, platelet count of 59 no evidence of active bleeding at this time  DM.   Sugar is elevated at 356 increase sliding scale   Raenette Sakata 01/05/2018  *Care during the described time interval was provided by me and/or other providers on the critical care team.  I have reviewed this patient's available data, including medical history, events of note, physical examination and test results as part of my evaluation.

## 2018-01-05 NOTE — Progress Notes (Signed)
[facility]  ROUNDING NOTE   Subjective:  Patient overall feeling better as compared to earlier yesterday. Underwent hemodialysis yesterday.  Has a bit of epistaxis at the moment.   Objective:  Vital signs in last 24 hours:  Temp:  [98 F (36.7 C)-101.5 F (38.6 C)] 98 F (36.7 C) (10/05 0700) Pulse Rate:  [67-107] 86 (10/05 1000) Resp:  [11-35] 24 (10/05 1000) BP: (78-148)/(48-135) 139/90 (10/05 0948) SpO2:  [88 %-100 %] 92 % (10/05 1000) FiO2 (%):  [40 %] 40 % (10/05 0745) Weight:  [91.7 kg] 91.7 kg (10/04 1135)  Weight change:  Filed Weights   01/02/18 2104 01/03/18 0428 01/04/18 1135  Weight: 92.7 kg 92.1 kg 91.7 kg    Intake/Output: I/O last 3 completed shifts: In: 485.3 [P.O.:240; I.V.:195.3; IV Piggyback:50] Out: 3875 [Other:1040]   Intake/Output this shift:  Total I/O In: 306.8 [P.O.:300; I.V.:6.8] Out: -   Physical Exam: General: No acute distress  Head: Normocephalic, atraumatic. Moist oral mucosal membranes  Eyes: Anicteric, left conjunctival erythema  Neck: Supple, trachea midline  Lungs:  Scattered rhonchi, normal effort  CV: S1S2 no rubs  Abdomen:  Soft, nontender, BS present  Extremities: No peripheral edema.  Neurologic: Awake, alert, following commands  Skin: No lesions  Access: Left forearm AVF    Basic Metabolic Panel: Recent Labs  Lab 12/31/17 0027 12/31/17 0436 12/31/17 0946 01/02/18 1904 01/04/18 0804 01/04/18 1526 01/04/18 1850 01/05/18 0701  NA 135  --   --  140 138  --   --  137  K 6.6*  --   --  3.1* 3.7  --  3.3* 4.2  CL 95*  --   --  98 95*  --   --  94*  CO2 24  --   --  32 28  --   --  28  GLUCOSE 527*  --   --  110* 150*  --   --  339*  BUN 89*  --   --  25* 66*  --   --  55*  CREATININE 7.42*  --   --  2.56* 6.44*  --   --  5.09*  CALCIUM 6.7*  --   --  7.3* 6.2*  --   --  6.6*  MG  --  2.7*  --   --   --   --  1.7  --   PHOS  --  4.7* 4.4 2.1*  --  3.7 2.4*  --     Liver Function Tests: Recent  Labs  Lab 12/31/17 0027 01/02/18 1904  AST 20  --   ALT 24  --   ALKPHOS 76  --   BILITOT 1.0  --   PROT 5.6*  --   ALBUMIN 3.5 3.0*   No results for input(s): LIPASE, AMYLASE in the last 168 hours. No results for input(s): AMMONIA in the last 168 hours.  CBC: Recent Labs  Lab 12/31/17 0027 12/31/17 1155 01/02/18 1904 01/03/18 2157 01/04/18 0804 01/05/18 0701  WBC 6.2  --  5.2  --  6.2 4.9  NEUTROABS 5.9  --   --   --   --  4.5  HGB 6.0* 7.4* 7.3* 7.2* 7.3* 8.0*  HCT 18.0*  --  20.5* 20.5* 21.2* 23.7*  MCV 98.8  --  92.1  --  90.9 92.1  PLT 141*  --  104*  --  67* 59*    Cardiac Enzymes: Recent Labs  Lab 12/31/17 0027  TROPONINI  0.03*    BNP: Invalid input(s): POCBNP  CBG: Recent Labs  Lab 01/04/18 1727 01/04/18 1729 01/04/18 1947 01/05/18 0721 01/05/18 0900  GLUCAP 194* 189* 200* 29* 76*    Microbiology: Results for orders placed or performed during the hospital encounter of 12/30/17  Culture, blood (routine x 2)     Status: None   Collection Time: 12/31/17 12:30 AM  Result Value Ref Range Status   Specimen Description BLOOD RIGHT UPPER ARM  Final   Special Requests   Final    BOTTLES DRAWN AEROBIC AND ANAEROBIC Blood Culture adequate volume   Culture   Final    NO GROWTH 5 DAYS Performed at [facility], [address], [place], [place] [address]    Report Status 01/05/2018 FINAL  Final  Urine culture     Status: None   Collection Time: 12/31/17  1:13 AM  Result Value Ref Range Status   Specimen Description   Final    URINE, RANDOM Performed at [facility], [address] [place], [place] [address]    Special Requests   Final    NONE Performed at [facility], [address], [place], [place] [address]    Culture   Final    NO GROWTH Performed at [facility], [place] [address] [place], [place] [address]    Report Status 01/01/2018 FINAL  Final  Culture, blood (routine x 2)     Status: None    Collection Time: 12/31/17  2:34 AM  Result Value Ref Range Status   Specimen Description BLOOD RIGHT ANTECUBITAL  Final   Special Requests   Final    BOTTLES DRAWN AEROBIC AND ANAEROBIC Blood Culture results may not be optimal due to an excessive volume of blood received in culture bottles   Culture   Final    NO GROWTH 5 DAYS Performed at [facility], [address] [place], [place] [address]    Report Status 01/05/2018 FINAL  Final  MRSA PCR Screening     Status: None   Collection Time: 12/31/17  4:43 AM  Result Value Ref Range Status   MRSA by PCR NEGATIVE NEGATIVE Final    Comment:        The GeneXpert MRSA Assay (FDA approved for NASAL specimens only), is one component of a comprehensive MRSA colonization surveillance program. It is not intended to diagnose MRSA infection nor to guide or monitor treatment for MRSA infections. Performed at [facility], [address] [place], [place] [address]     Coagulation Studies: No results for input(s): LABPROT, INR in the last 72 hours.  Urinalysis: No results for input(s): COLORURINE, LABSPEC, PHURINE, GLUCOSEU, HGBUR, BILIRUBINUR, KETONESUR, PROTEINUR, UROBILINOGEN, NITRITE, LEUKOCYTESUR in the last 72 hours.  Invalid input(s): APPERANCEUR    Imaging: Dg Chest Port 1 View  Result Date: 01/05/2018 CLINICAL DATA:  Shortness of breath, respiratory failure EXAM: PORTABLE CHEST 1 VIEW COMPARISON:  01/04/2018 FINDINGS: Right Port-A-Cath remains in place, unchanged. Diffuse bilateral airspace opacities and small right effusion, stable since prior study. Heart is borderline in size. IMPRESSION: Stable diffuse bilateral airspace opacities and small right effusion. Electronically Signed   By: Rolm Baptise M.D.   On: 01/05/2018 08:25   Dg Chest Port 1 View  Result Date: 01/04/2018 CLINICAL DATA:  Short of breath on exertion EXAM: PORTABLE CHEST 1 VIEW COMPARISON:  12/30/2017 FINDINGS: Progressive bilateral  airspace disease with basilar predominance. Progression of small right effusion. Interval placement of Port-A-Cath  with the tip in the right atrium. No pneumothorax Port-A-Cath tip in the right atrium Progressive bilateral airspace disease and small right effusion. IMPRESSION: No active disease. Electronically Signed   By: Franchot Gallo M.D.   On: 01/04/2018 11:10     Medications:   . sodium chloride    . sodium chloride    . sodium chloride    . sodium chloride 5 mL/hr at 01/05/18 0800  . famotidine (PEPCID) IV 20 mg (01/05/18 0916)   . sodium chloride   Intravenous Once  . bumetanide  1 mg Intravenous Once  . chlorhexidine  15 mL Mouth Rinse BID  . Chlorhexidine Gluconate Cloth  6 each Topical Q0600  . Difluprednate  1 drop Left Eye BID  . feeding supplement (ENSURE ENLIVE)  237 mL Oral Daily  . gabapentin  100 mg Oral TID  . insulin aspart  0-15 Units Subcutaneous TID WC  . insulin detemir  10 Units Subcutaneous Daily  . ipratropium-albuterol  3 mL Nebulization TID  . mouth rinse  15 mL Mouth Rinse BID  . mouth rinse  15 mL Mouth Rinse q12n4p  . methylPREDNISolone (SOLU-MEDROL) injection  40 mg Intravenous Q12H  . metoCLOPramide  5 mg Oral TID AC  . mometasone-formoterol  2 puff Inhalation BID  . multivitamin  1 tablet Oral QHS  . pravastatin  20 mg Oral q1800  . torsemide  100 mg Oral Once per day on Sun Tue Thu Sat   sodium chloride, sodium chloride, sodium chloride, acetaminophen **OR** acetaminophen, albuterol, alteplase, bisacodyl, dextrose, ipratropium-albuterol, lidocaine (PF), lidocaine-prilocaine, midodrine, ondansetron **OR** ondansetron (ZOFRAN) IV, pentafluoroprop-tetrafluoroeth, polyvinyl alcohol, senna-docusate, sodium chloride, sodium chloride flush  Assessment/ Plan:  [name]. is a 72 y.o. black male with end stage renal disease on hemodialysis, CLL, diabetes mellitus type II, COPD, history of bladder cancer who is admitted to [facility] on 12/30/2017  for shortness of breath.   [facility] MWF [facility] L AVF 90.5kg  1.  ESRD on HD MWF. Patient completed hemodialysis yesterday.  No acute indication for dialysis today.  We will plan for dialysis again on Monday.  2.  Anemia of chronic kidney disease.  Hemoglobin up to 8.0.  Continue to monitor.  3.  Secondary hyperparathyroidism: Continue to monitor serum phosphorus.  4. Rt femoral DVT -Previously on Eliquis for this issue.  Had GI bleed this admission.   5.  Hyperkalemia.  Resolved with dialysis.  6.  Increasing shortness of breath.  Bilateral airspace disease noted.  Likely represents pneumonia as the patient has not had very significant input.  Seems imrpoved today.    LOS: 5 Mark [name] 10/5/201910:44 AM

## 2018-01-05 NOTE — Progress Notes (Signed)
Patient seen for svn tx. C/o extreme nasal dryness and h/o nasal bleeds. Patient states md has instructed him not to blow his nose however he feels the need to because that is the only way he he can relieve his shortness of breath and nasal stuffiness.  o2 sat on 3 liters noted at 87-89%. Placed patient on 35% aerosol face mask to help with nasal dryness. o2 sats increased to 93%. Patient states such is helping. Will continue to monitor.

## 2018-01-06 ENCOUNTER — Inpatient Hospital Stay: Payer: Medicare HMO

## 2018-01-06 LAB — GLUCOSE, CAPILLARY
GLUCOSE-CAPILLARY: 403 mg/dL — AB (ref 70–99)
Glucose-Capillary: 256 mg/dL — ABNORMAL HIGH (ref 70–99)
Glucose-Capillary: 376 mg/dL — ABNORMAL HIGH (ref 70–99)
Glucose-Capillary: 429 mg/dL — ABNORMAL HIGH (ref 70–99)
Glucose-Capillary: 377 mg/dL — ABNORMAL HIGH (ref 70–99)
Glucose-Capillary: 406 mg/dL — ABNORMAL HIGH (ref 70–99)
Glucose-Capillary: 387 mg/dL — ABNORMAL HIGH (ref 70–99)

## 2018-01-06 LAB — CBC
HCT: 22.8 % — ABNORMAL LOW (ref 40.0–52.0)
HEMATOCRIT: 21.4 % — AB (ref 40.0–52.0)
HEMOGLOBIN: 7.2 g/dL — AB (ref 13.0–18.0)
Hemoglobin: 7.7 g/dL — ABNORMAL LOW (ref 13.0–18.0)
MCH: 30.8 pg (ref 26.0–34.0)
MCH: 31.1 pg (ref 26.0–34.0)
MCHC: 33.7 g/dL (ref 32.0–36.0)
MCHC: 33.6 g/dL (ref 32.0–36.0)
MCV: 91.3 fL (ref 80.0–100.0)
MCV: 92.5 fL (ref 80.0–100.0)
PLATELETS: 77 10*3/uL — AB (ref 150–440)
Platelets: 70 10*3/uL — ABNORMAL LOW (ref 150–440)
RBC: 2.34 MIL/uL — ABNORMAL LOW (ref 4.40–5.90)
RBC: 2.47 MIL/uL — ABNORMAL LOW (ref 4.40–5.90)
RDW: 16 % — AB (ref 11.5–14.5)
RDW: 15.9 % — ABNORMAL HIGH (ref 11.5–14.5)
WBC: 6.8 10*3/uL (ref 3.8–10.6)
WBC: 6.8 10*3/uL (ref 3.8–10.6)

## 2018-01-06 LAB — BASIC METABOLIC PANEL
Anion gap: 14 (ref 5–15)
BUN: 86 mg/dL — ABNORMAL HIGH (ref 8–23)
CALCIUM: 6.5 mg/dL — AB (ref 8.9–10.3)
CO2: 27 mmol/L (ref 22–32)
CREATININE: 6.44 mg/dL — AB (ref 0.61–1.24)
Chloride: 94 mmol/L — ABNORMAL LOW (ref 98–111)
GFR calc non Af Amer: 8 mL/min — ABNORMAL LOW (ref >60)
GFR, EST AFRICAN AMERICAN: 9 mL/min — AB (ref >60)
GLUCOSE: 255 mg/dL — AB (ref 70–99)
Potassium: 4.6 mmol/L (ref 3.5–5.1)
Sodium: 135 mmol/L (ref 135–145)

## 2018-01-06 LAB — HEMOGLOBIN: Hemoglobin: 7.9 g/dL — ABNORMAL LOW (ref 13.0–18.0)

## 2018-01-06 LAB — PREPARE RBC (CROSSMATCH)

## 2018-01-06 LAB — PROCALCITONIN: PROCALCITONIN: 0.91 ng/mL

## 2018-01-06 MED ORDER — INSULIN ASPART 100 UNIT/ML ~~LOC~~ SOLN
18.0000 [IU] | Freq: Once | SUBCUTANEOUS | Status: AC
  Administered 2018-01-06: 22:00:00 18 [IU] via SUBCUTANEOUS
  Filled 2018-01-06: qty 1

## 2018-01-06 MED ORDER — IPRATROPIUM-ALBUTEROL 0.5-2.5 (3) MG/3ML IN SOLN
3.0000 mL | Freq: Four times a day (QID) | RESPIRATORY_TRACT | Status: DC
  Administered 2018-01-06 – 2018-01-08 (×7): 3 mL via RESPIRATORY_TRACT
  Filled 2018-01-06 (×7): qty 3

## 2018-01-06 MED ORDER — SODIUM CHLORIDE 0.9% IV SOLUTION
Freq: Once | INTRAVENOUS | Status: AC
  Administered 2018-01-06: 14:00:00 via INTRAVENOUS

## 2018-01-06 MED ORDER — INSULIN DETEMIR 100 UNIT/ML ~~LOC~~ SOLN
14.0000 [IU] | Freq: Every day | SUBCUTANEOUS | Status: DC
  Administered 2018-01-06 – 2018-01-08 (×3): 14 [IU] via SUBCUTANEOUS
  Filled 2018-01-06 (×4): qty 0.14

## 2018-01-06 MED ORDER — INSULIN DETEMIR 100 UNIT/ML ~~LOC~~ SOLN: 14.0000 [IU] | Freq: Every day | SUBCUTANEOUS | Status: DC

## 2018-01-06 MED ORDER — INSULIN ASPART 100 UNIT/ML ~~LOC~~ SOLN
4.0000 [IU] | Freq: Three times a day (TID) | SUBCUTANEOUS | Status: DC
  Administered 2018-01-07 – 2018-01-08 (×4): 4 [IU] via SUBCUTANEOUS
  Filled 2018-01-06 (×6): qty 1

## 2018-01-06 NOTE — Plan of Care (Signed)
  Problem: Education: Goal: Knowledge of General Education information will improve Description Including pain rating scale, medication(s)/side effects and non-pharmacologic comfort measures 01/06/2018 1640 by Herbie [place], RN Outcome: Progressing 01/06/2018 1550 by Herbie [place], RN Outcome: Progressing   Problem: Health Behavior/Discharge Planning: Goal: Ability to manage health-related needs will improve 01/06/2018 1640 by Herbie [place], RN Outcome: Progressing 01/06/2018 1550 by Herbie [place], RN Outcome: Progressing   Problem: Clinical Measurements: Goal: Ability to maintain clinical measurements within normal limits will improve 01/06/2018 1640 by Herbie [place], RN Outcome: Progressing 01/06/2018 1550 by Herbie [place], RN Outcome: Progressing Goal: Will remain free from infection 01/06/2018 1640 by Herbie [place], RN Outcome: Progressing 01/06/2018 1550 by Herbie [place], RN Outcome: Progressing Goal: Diagnostic test results will improve 01/06/2018 1640 by Herbie [place], RN Outcome: Progressing 01/06/2018 1550 by Herbie [place], RN Outcome: Progressing Goal: Respiratory complications will improve 01/06/2018 1640 by Herbie [place], RN Outcome: Progressing 01/06/2018 1550 by Herbie [place], RN Outcome: Progressing   Problem: Activity: Goal: Risk for activity intolerance will decrease 01/06/2018 1640 by Herbie [place], RN Outcome: Progressing 01/06/2018 1550 by Herbie [place], RN Outcome: Progressing   Problem: Elimination: Goal: Will not experience complications related to bowel motility 01/06/2018 1640 by Herbie [place], RN Outcome: Progressing 01/06/2018 1550 by Herbie [place], RN Outcome: Progressing Goal: Will not experience complications related to urinary retention 01/06/2018 1640 by Herbie [place], RN Outcome: Progressing 01/06/2018 1550 by Herbie [place],  RN Outcome: Progressing   Problem: Pain Managment: Goal: General experience of comfort will improve 01/06/2018 1640 by Herbie [place], RN Outcome: Progressing 01/06/2018 1550 by Herbie [place], RN Outcome: Progressing   Problem: Safety: Goal: Ability to remain free from injury will improve 01/06/2018 1640 by Herbie [place], RN Outcome: Progressing 01/06/2018 1550 by Herbie [place], RN Outcome: Progressing

## 2018-01-06 NOTE — Progress Notes (Signed)
Dr. Estanislado Pandy notified of CBG of 406; Acknowledged; new order written. Barbaraann Faster, RN 8:53 PM; 01/06/2018

## 2018-01-06 NOTE — Progress Notes (Signed)
[facility]  ROUNDING NOTE   Subjective:  Patient seen at bedside. Resting comfortably. Social work working on placement for nursing home.   Objective:  Vital signs in last 24 hours:  Temp:  [97.9 F (36.6 C)-98.6 F (37 C)] 97.9 F (36.6 C) (10/06 0841) Pulse Rate:  [71-85] 85 (10/06 0841) Resp:  [18-22] 22 (10/06 0841) BP: (124-164)/(62-76) 164/62 (10/06 0841) SpO2:  [88 %-96 %] 94 % (10/06 0841) FiO2 (%):  [35 %] 35 % (10/05 2050)  Weight change:  Filed Weights   01/02/18 2104 01/03/18 0428 01/04/18 1135  Weight: 92.7 kg 92.1 kg 91.7 kg    Intake/Output: I/O last 3 completed shifts: In: 819.6 [P.O.:540; I.V.:129.2; IV Piggyback:150.5] Out: -    Intake/Output this shift:  No intake/output data recorded.  Physical Exam: General: No acute distress  Head: Normocephalic, atraumatic. Moist oral mucosal membranes  Eyes: Anicteric, left conjunctival erythema  Neck: Supple, trachea midline  Lungs:  Scattered rhonchi, normal effort  CV: S1S2 no rubs  Abdomen:  Soft, nontender, BS present  Extremities: No peripheral edema.  Neurologic: Awake, alert, following commands  Skin: No lesions  Access: Left forearm AVF    Basic Metabolic Panel: Recent Labs  Lab 12/31/17 0027 12/31/17 0436 12/31/17 0946 01/02/18 1904 01/04/18 0804 01/04/18 1526 01/04/18 1850 01/05/18 0701 01/06/18 0506  NA 135  --   --  140 138  --   --  137 135  K 6.6*  --   --  3.1* 3.7  --  3.3* 4.2 4.6  CL 95*  --   --  98 95*  --   --  94* 94*  CO2 24  --   --  32 28  --   --  28 27  GLUCOSE 527*  --   --  110* 150*  --   --  339* 255*  BUN 89*  --   --  25* 66*  --   --  55* 86*  CREATININE 7.42*  --   --  2.56* 6.44*  --   --  5.09* 6.44*  CALCIUM 6.7*  --   --  7.3* 6.2*  --   --  6.6* 6.5*  MG  --  2.7*  --   --   --   --  1.7  --   --   PHOS  --  4.7* 4.4 2.1*  --  3.7 2.4*  --   --     Liver Function Tests: Recent Labs  Lab 12/31/17 0027 01/02/18 1904  AST 20  --    ALT 24  --   ALKPHOS 76  --   BILITOT 1.0  --   PROT 5.6*  --   ALBUMIN 3.5 3.0*   No results for input(s): LIPASE, AMYLASE in the last 168 hours. No results for input(s): AMMONIA in the last 168 hours.  CBC: Recent Labs  Lab 12/31/17 0027  01/02/18 1904 01/03/18 2157 01/04/18 0804 01/05/18 0701 01/06/18 0506  WBC 6.2  --  5.2  --  6.2 4.9 6.8  NEUTROABS 5.9  --   --   --   --  4.5  --   HGB 6.0*   < > 7.3* 7.2* 7.3* 8.0* 7.2*  HCT 18.0*  --  20.5* 20.5* 21.2* 23.7* 21.4*  MCV 98.8  --  92.1  --  90.9 92.1 91.3  PLT 141*  --  104*  --  67* 59* 70*   < > =  values in this interval not displayed.    Cardiac Enzymes: Recent Labs  Lab 12/31/17 0027  TROPONINI 0.03*    BNP: Invalid input(s): POCBNP  CBG: Recent Labs  Lab 01/05/18 1623 01/05/18 1817 01/05/18 2022 01/06/18 0812 01/06/18 1140  GLUCAP 313* 408* 347* 256* 376*    Microbiology: Results for orders placed or performed during the hospital encounter of 12/30/17  Culture, blood (routine x 2)     Status: None   Collection Time: 12/31/17 12:30 AM  Result Value Ref Range Status   Specimen Description BLOOD RIGHT UPPER ARM  Final   Special Requests   Final    BOTTLES DRAWN AEROBIC AND ANAEROBIC Blood Culture adequate volume   Culture   Final    NO GROWTH 5 DAYS Performed at [facility], [address], [place], [place] [address]    Report Status 01/05/2018 FINAL  Final  Urine culture     Status: None   Collection Time: 12/31/17  1:13 AM  Result Value Ref Range Status   Specimen Description   Final    URINE, RANDOM Performed at [facility], [address], [place], [place] [address]    Special Requests   Final    NONE Performed at [facility], [address] [place], [place] [address]    Culture   Final    NO GROWTH Performed at [facility], [place] [address], [place], [place] [address]    Report Status 01/01/2018 FINAL  Final  Culture, blood (routine x 2)      Status: None   Collection Time: 12/31/17  2:34 AM  Result Value Ref Range Status   Specimen Description BLOOD RIGHT ANTECUBITAL  Final   Special Requests   Final    BOTTLES DRAWN AEROBIC AND ANAEROBIC Blood Culture results may not be optimal due to an excessive volume of blood received in culture bottles   Culture   Final    NO GROWTH 5 DAYS Performed at [facility], [address] [place], [place] [address]    Report Status 01/05/2018 FINAL  Final  MRSA PCR Screening     Status: None   Collection Time: 12/31/17  4:43 AM  Result Value Ref Range Status   MRSA by PCR NEGATIVE NEGATIVE Final    Comment:        The GeneXpert MRSA Assay (FDA approved for NASAL specimens only), is one component of a comprehensive MRSA colonization surveillance program. It is not intended to diagnose MRSA infection nor to guide or monitor treatment for MRSA infections. Performed at [facility], [address] [place], [place] [address]     Coagulation Studies: No results for input(s): LABPROT, INR in the last 72 hours.  Urinalysis: No results for input(s): COLORURINE, LABSPEC, PHURINE, GLUCOSEU, HGBUR, BILIRUBINUR, KETONESUR, PROTEINUR, UROBILINOGEN, NITRITE, LEUKOCYTESUR in the last 72 hours.  Invalid input(s): APPERANCEUR    Imaging: Ct Chest Wo Contrast  Result Date: 01/06/2018 CLINICAL DATA:  Inpatient.  Dyspnea.  History of bladder cancer. EXAM: CT CHEST WITHOUT CONTRAST TECHNIQUE: Multidetector CT imaging of the chest was performed following the standard protocol without IV contrast. COMPARISON:  Chest radiograph from one day prior. 03/07/2016 chest CT. 11/30/2016 PET-CT. FINDINGS: Cardiovascular: Normal heart size. No significant pericardial effusion/thickening. Right internal jugular MediPort terminates at the cavoatrial junction. Left main and 3 vessel coronary atherosclerosis. Atherosclerotic nonaneurysmal thoracic aorta. Top-normal caliber main pulmonary artery  (3.1 cm diameter). Mediastinum/Nodes: No discrete thyroid nodules. Unremarkable esophagus. Mild  bilateral axillary adenopathy measuring up to the 1.5 cm on the left (series 2/image 29), mildly decreased from 1.7 cm on 11/30/2016 PET-CT. Enlarged 2.6 cm left supraclavicular node (series 2/image 1), stable. Left prevascular adenopathy up to 1.5 cm (series 2/image 45), stable. Enlarged 2.3 cm AP window node (series 2/image 52), stable. Right paratracheal adenopathy up to the 2.1 cm (series 2/image 29), decreased from 2.6 cm. Enlarged 2.1 cm subcarinal node (series 2/image 70), stable. Mild bilateral hilar adenopathy is poorly delineated on these noncontrast images and is not definitely changed. Lungs/Pleura: No pneumothorax. Small to moderate right and small left dependent pleural effusions. Moderate right and mild left dependent lower lobe compressive atelectasis. There is extensive patchy peribronchovascular ground-glass opacity and centrilobular ground-glass micronodularity in both lungs with relative sparing of the lung bases. There is scattered mild interlobular septal thickening. No lung masses. Upper abdomen: Nonobstructing 3 mm upper left renal stone. Musculoskeletal: No aggressive appearing focal osseous lesions. Symmetric mild gynecomastia. Mild anasarca. Moderate thoracic spondylosis. IMPRESSION: 1. Small to moderate right and small left dependent pleural effusions. Mild anasarca. Extensive ground-glass opacity and centrilobular ground-glass micronodularity in both lungs with mild interlobular septal thickening, favoring pulmonary edema. These findings suggest fluid overload in this patient with history of dialysis. 2. Extensive thoracic adenopathy in the bilateral axillary, bilateral mediastinal, bilateral hilar and left supraclavicular regions, stable to decreased since 11/30/2016 PET-CT. 3. Left main and 3 vessel coronary atherosclerosis. Aortic Atherosclerosis (ICD10-I70.0). Electronically Signed   By:  Ilona Sorrel M.D.   On: 01/06/2018 11:34   Dg Chest Port 1 View  Result Date: 01/05/2018 CLINICAL DATA:  Shortness of breath, respiratory failure EXAM: PORTABLE CHEST 1 VIEW COMPARISON:  01/04/2018 FINDINGS: Right Port-A-Cath remains in place, unchanged. Diffuse bilateral airspace opacities and small right effusion, stable since prior study. Heart is borderline in size. IMPRESSION: Stable diffuse bilateral airspace opacities and small right effusion. Electronically Signed   By: Rolm Baptise M.D.   On: 01/05/2018 08:25     Medications:   . sodium chloride    . sodium chloride    . sodium chloride    . sodium chloride Stopped (01/05/18 1000)  . ceFEPime (MAXIPIME) IV Stopped (01/05/18 1424)  . famotidine (PEPCID) IV 20 mg (01/06/18 0909)   . sodium chloride   Intravenous Once  . sodium chloride   Intravenous Once  . bumetanide  1 mg Intravenous Once  . chlorhexidine  15 mL Mouth Rinse BID  . Difluprednate  1 drop Left Eye BID  . [START ON 01/07/2018] epoetin (EPOGEN/PROCRIT) injection  10,000 Units Intravenous Q M,W,F-HD  . feeding supplement (ENSURE ENLIVE)  237 mL Oral Daily  . gabapentin  100 mg Oral TID  . insulin aspart  0-15 Units Subcutaneous TID WC  . insulin aspart  4 Units Subcutaneous TID WC  . insulin detemir  14 Units Subcutaneous Daily  . ipratropium-albuterol  3 mL Nebulization TID  . mouth rinse  15 mL Mouth Rinse BID  . mouth rinse  15 mL Mouth Rinse q12n4p  . methylPREDNISolone (SOLU-MEDROL) injection  40 mg Intravenous Q12H  . metoCLOPramide  5 mg Oral TID AC  . mometasone-formoterol  2 puff Inhalation BID  . multivitamin  1 tablet Oral QHS  . oxymetazoline  1 spray Each Nare BID  . pravastatin  20 mg Oral q1800  . torsemide  100 mg Oral Once per day on Sun Tue Thu Sat   sodium chloride, sodium chloride, sodium chloride, acetaminophen **OR** acetaminophen, albuterol, alteplase,  bisacodyl, dextrose, ipratropium-albuterol, lactulose, lidocaine (PF),  lidocaine-prilocaine, midodrine, ondansetron **OR** ondansetron (ZOFRAN) IV, pentafluoroprop-tetrafluoroeth, polyvinyl alcohol, senna-docusate, sodium chloride, sodium chloride flush  Assessment/ Plan:  [name]. is a 72 y.o. black male with end stage renal disease on hemodialysis, CLL, diabetes mellitus type II, COPD, history of bladder cancer who is admitted to [facility] on 12/30/2017 for shortness of breath.   [facility] MWF [facility] AVF 90.5kg  1.  ESRD on HD MWF. No acute indication for dialysis today.  Patient will be due for hemodialysis again tomorrow.  2.  Anemia of chronic kidney disease.  Hemoglobin continues to drift down and is currently 7.2.  He has required blood transfusion this admission.  Defer decisions regarding blood transfusion to hospitalist.  3.  Secondary hyperparathyroidism: Check phosphorus with next dialysis treatment on Monday.  4. Rt femoral DVT -Previously on Eliquis for this issue.  Had GI bleed this admission.   5.  Hyperkalemia.  Resolved with dialysis.  6.  Increasing shortness of breath.  Patient being treated for pneumonia at this time with cefepime.   LOS: 6 Luree Palla 10/6/201911:43 AM

## 2018-01-06 NOTE — Plan of Care (Signed)
  Problem: Education: Goal: Knowledge of General Education information will improve Description Including pain rating scale, medication(s)/side effects and non-pharmacologic comfort measures Outcome: Progressing   Problem: Health Behavior/Discharge Planning: Goal: Ability to manage health-related needs will improve Outcome: Progressing   Problem: Clinical Measurements: Goal: Ability to maintain clinical measurements within normal limits will improve Outcome: Progressing Goal: Will remain free from infection Outcome: Progressing Goal: Diagnostic test results will improve Outcome: Progressing Goal: Respiratory complications will improve Outcome: Progressing   Problem: Activity: Goal: Risk for activity intolerance will decrease Outcome: Progressing   Problem: Elimination: Goal: Will not experience complications related to bowel motility Outcome: Progressing Goal: Will not experience complications related to urinary retention Outcome: Progressing   Problem: Pain Managment: Goal: General experience of comfort will improve Outcome: Progressing   Problem: Safety: Goal: Ability to remain free from injury will improve Outcome: Progressing

## 2018-01-06 NOTE — Progress Notes (Addendum)
Patient ID: Mark [name]., male   DOB: 1945-07-16, 72 y.o.   MRN: [id number]     [employer] PROGRESS NOTE  Laretta Alstrom [name]. [id number] DOB: 1945-04-25 DOA: 12/30/2017 PCP: Albina Billet, MD  HPI/Subjective: Patient complains of breath , denies any chest pain or palpitations  Objective: Vitals:   01/06/18 0841 01/06/18 1228  BP: (!) 164/62 (!) 172/72  Pulse: 85 84  Resp: (!) 22 (!) 22  Temp: 97.9 F (36.6 C) 97.7 F (36.5 C)  SpO2: 94% 93%    Filed Weights   01/02/18 2104 01/03/18 0428 01/04/18 1135  Weight: 92.7 kg 92.1 kg 91.7 kg    ROS: Review of Systems  Constitutional: Positive for malaise/fatigue. Negative for chills and fever.  Eyes: Negative for blurred vision.  Respiratory: Positive for cough and shortness of breath.   Cardiovascular: Negative for chest pain.  Gastrointestinal: Negative for abdominal pain, constipation, diarrhea, nausea and vomiting.  Genitourinary: Negative for dysuria.  Musculoskeletal: Negative for joint pain.  Neurological: Negative for dizziness and headaches.   Exam: Physical Exam  Constitutional: He is oriented to person, place, and time.  HENT:  Nose: No mucosal edema.  Mouth/Throat: No oropharyngeal exudate or posterior oropharyngeal edema.  Eyes: Pupils are equal, round, and reactive to light. Conjunctivae, EOM and lids are normal.  Neck: No JVD present. Carotid bruit is not present. No edema present. No thyroid mass and no thyromegaly present.  Cardiovascular: S1 normal and S2 normal. Exam reveals no gallop.  No murmur heard. Pulses:      Dorsalis pedis pulses are 2+ on the right side, and 2+ on the left side.  Respiratory: No respiratory distress. He has decreased breath sounds in the right lower field and the left lower field. He has no wheezes. He has no rhonchi. He has no rales.  GI: Soft. Bowel sounds are normal. There is no tenderness.  Musculoskeletal:       Right ankle: He exhibits no swelling.   Left ankle: He exhibits no swelling.  Lymphadenopathy:    He has no cervical adenopathy.  Neurological: He is alert and oriented to person, place, and time. No cranial nerve deficit.  Skin: Skin is warm. No rash noted. Nails show no clubbing.  Right groin with pressure dressing still on it.  Psychiatric: He has a normal mood and affect.      Data Reviewed: Basic Metabolic Panel: Recent Labs  Lab 12/31/17 0027 12/31/17 0436 12/31/17 0946 01/02/18 1904 01/04/18 0804 01/04/18 1526 01/04/18 1850 01/05/18 0701 01/06/18 0506  NA 135  --   --  140 138  --   --  137 135  K 6.6*  --   --  3.1* 3.7  --  3.3* 4.2 4.6  CL 95*  --   --  98 95*  --   --  94* 94*  CO2 24  --   --  32 28  --   --  28 27  GLUCOSE 527*  --   --  110* 150*  --   --  339* 255*  BUN 89*  --   --  25* 66*  --   --  55* 86*  CREATININE 7.42*  --   --  2.56* 6.44*  --   --  5.09* 6.44*  CALCIUM 6.7*  --   --  7.3* 6.2*  --   --  6.6* 6.5*  MG  --  2.7*  --   --   --   --  1.7  --   --   PHOS  --  4.7* 4.4 2.1*  --  3.7 2.4*  --   --    Liver Function Tests: Recent Labs  Lab 12/31/17 0027 01/02/18 1904  AST 20  --   ALT 24  --   ALKPHOS 76  --   BILITOT 1.0  --   PROT 5.6*  --   ALBUMIN 3.5 3.0*   CBC: Recent Labs  Lab 12/31/17 0027  01/02/18 1904 01/03/18 2157 01/04/18 0804 01/05/18 0701 01/06/18 0506  WBC 6.2  --  5.2  --  6.2 4.9 6.8  NEUTROABS 5.9  --   --   --   --  4.5  --   HGB 6.0*   < > 7.3* 7.2* 7.3* 8.0* 7.2*  HCT 18.0*  --  20.5* 20.5* 21.2* 23.7* 21.4*  MCV 98.8  --  92.1  --  90.9 92.1 91.3  PLT 141*  --  104*  --  67* 59* 70*   < > = values in this interval not displayed.   Cardiac Enzymes: Recent Labs  Lab 12/31/17 0027  TROPONINI 0.03*    CBG: Recent Labs  Lab 01/05/18 1623 01/05/18 1817 01/05/18 2022 01/06/18 0812 01/06/18 1140  GLUCAP 313* 408* 347* 256* 376*    Recent Results (from the past 240 hour(s))  Culture, blood (routine x 2)     Status: None    Collection Time: 12/31/17 12:30 AM  Result Value Ref Range Status   Specimen Description BLOOD RIGHT UPPER ARM  Final   Special Requests   Final    BOTTLES DRAWN AEROBIC AND ANAEROBIC Blood Culture adequate volume   Culture   Final    NO GROWTH 5 DAYS Performed at [facility], [address], [place], [place] [address]    Report Status 01/05/2018 FINAL  Final  Urine culture     Status: None   Collection Time: 12/31/17  1:13 AM  Result Value Ref Range Status   Specimen Description   Final    URINE, RANDOM Performed at [facility], [address], [place], [place] [address]    Special Requests   Final    NONE Performed at [facility], [address] [place], [place] [address]    Culture   Final    NO GROWTH Performed at [facility], [place] [address] [place], [place] [address]    Report Status 01/01/2018 FINAL  Final  Culture, blood (routine x 2)     Status: None   Collection Time: 12/31/17  2:34 AM  Result Value Ref Range Status   Specimen Description BLOOD RIGHT ANTECUBITAL  Final   Special Requests   Final    BOTTLES DRAWN AEROBIC AND ANAEROBIC Blood Culture results may not be optimal due to an excessive volume of blood received in culture bottles   Culture   Final    NO GROWTH 5 DAYS Performed at [facility], [address] [place], [place] [address]    Report Status 01/05/2018 FINAL  Final  MRSA PCR Screening     Status: None   Collection Time: 12/31/17  4:43 AM  Result Value Ref Range Status   MRSA by PCR NEGATIVE NEGATIVE Final    Comment:        The GeneXpert MRSA Assay (FDA approved for NASAL specimens only), is one component of a comprehensive MRSA colonization surveillance program. It is not intended to diagnose MRSA infection  nor to guide or monitor treatment for MRSA infections. Performed at [facility], [address] [place], [place] [address]      Scheduled Meds: . sodium chloride    Intravenous Once  . sodium chloride   Intravenous Once  . bumetanide  1 mg Intravenous Once  . chlorhexidine  15 mL Mouth Rinse BID  . Difluprednate  1 drop Left Eye BID  . [START ON 01/07/2018] epoetin (EPOGEN/PROCRIT) injection  10,000 Units Intravenous Q M,W,F-HD  . feeding supplement (ENSURE ENLIVE)  237 mL Oral Daily  . gabapentin  100 mg Oral TID  . insulin aspart  0-15 Units Subcutaneous TID WC  . insulin aspart  4 Units Subcutaneous TID WC  . insulin detemir  14 Units Subcutaneous Daily  . ipratropium-albuterol  3 mL Nebulization TID  . mouth rinse  15 mL Mouth Rinse BID  . mouth rinse  15 mL Mouth Rinse q12n4p  . methylPREDNISolone (SOLU-MEDROL) injection  40 mg Intravenous Q12H  . metoCLOPramide  5 mg Oral TID AC  . mometasone-formoterol  2 puff Inhalation BID  . multivitamin  1 tablet Oral QHS  . oxymetazoline  1 spray Each Nare BID  . pravastatin  20 mg Oral q1800  . torsemide  100 mg Oral Once per day on Sun Tue Thu Sat   Continuous Infusions: . sodium chloride    . sodium chloride    . sodium chloride    . sodium chloride Stopped (01/05/18 1000)  . ceFEPime (MAXIPIME) IV 1 g (01/06/18 1211)  . famotidine (PEPCID) IV 20 mg (01/06/18 0909)    Assessment/Plan:  1. Acute on chronic anemia.  Hemoglobin is low again we will give 1 more unit of blood today 2. hyperkalemia.  Resolved 3. Recent diagnosis of DVT with transfusion dependent anemia.  Eliquis on hold.  IVC filter placed. 4. Acute respiratory failure possible pneumonia procalcitonin high continue antibiotics CT also suggests fluid overload nephrology has been noticed by patient will need extra fluid removal tomorrow  5. type 2 diabetes mellitus increase Levemir  6. history of bladder cancer and CLL. 7. Hyperlipidemia unspecified on pravastatin 8. End-stage renal disease on dialysis 9. History of stroke.  Unable to give any blood thinners 10. DNR    Code Status:     Code Status Orders  (From admission,  onward)         Start     Ordered   12/31/17 0441  Do not attempt resuscitation (DNR)  Continuous    Question Answer Comment  In the event of cardiac or respiratory ARREST Do not call a "code blue"   In the event of cardiac or respiratory ARREST Do not perform Intubation, CPR, defibrillation or ACLS   In the event of cardiac or respiratory ARREST Use medication by any route, position, wound care, and other measures to relive pain and suffering. May use oxygen, suction and manual treatment of airway obstruction as needed for comfort.      12/31/17 0440        Code Status History    Date Active Date Inactive Code Status Order ID Comments User Context   12/31/2017 0212 12/31/2017 0440 DNR [id number]  Arta Silence, MD ED   12/25/2017 0514 12/27/2017 1651 Partial Code [id number]  Harrie Foreman, MD ED   12/18/2017 1441 12/23/2017 1907 Partial Code [id number]  Asencion Gowda, NP Inpatient   01/15/2017 0423 01/18/2017 2120 Full Code [id number]  Saundra Shelling, MD Inpatient   10/20/2016 1627 10/22/2016 1817  Full Code [id number]  Lance Coon, MD Inpatient      Disposition Plan: Patient now interested in rehab  Consultants:  Nephrology  Vascular surgery  Oncology  Time spent: 24 minutes  Vianka Ertel [facility]

## 2018-01-07 LAB — TYPE AND SCREEN
ABO/RH(D): B POS
Antibody Screen: NEGATIVE
UNIT DIVISION: 0
UNIT DIVISION: 0

## 2018-01-07 LAB — BPAM RBC
BLOOD PRODUCT EXPIRATION DATE: 201910182359
Blood Product Expiration Date: 201910262359
ISSUE DATE / TIME: 201910041600
ISSUE DATE / TIME: 201910061531
UNIT TYPE AND RH: 1700
Unit Type and Rh: 7300

## 2018-01-07 LAB — RENAL FUNCTION PANEL
Albumin: 2.8 g/dL — ABNORMAL LOW (ref 3.5–5.0)
Anion gap: 16 — ABNORMAL HIGH (ref 5–15)
BUN: 103 mg/dL — ABNORMAL HIGH (ref 8–23)
CALCIUM: 6 mg/dL — AB (ref 8.9–10.3)
CHLORIDE: 92 mmol/L — AB (ref 98–111)
CO2: 25 mmol/L (ref 22–32)
Creatinine, Ser: 7.52 mg/dL — ABNORMAL HIGH (ref 0.61–1.24)
GFR calc non Af Amer: 6 mL/min — ABNORMAL LOW (ref >60)
GFR, EST AFRICAN AMERICAN: 7 mL/min — AB (ref >60)
Glucose, Bld: 194 mg/dL — ABNORMAL HIGH (ref 70–99)
Phosphorus: 6.3 mg/dL — ABNORMAL HIGH (ref 2.5–4.6)
Potassium: 5.7 mmol/L — ABNORMAL HIGH (ref 3.5–5.1)
SODIUM: 133 mmol/L — AB (ref 135–145)

## 2018-01-07 LAB — BASIC METABOLIC PANEL
Anion gap: 15 (ref 5–15)
BUN: 66 mg/dL — ABNORMAL HIGH (ref 8–23)
CALCIUM: 6.2 mg/dL — AB (ref 8.9–10.3)
CO2: 28 mmol/L (ref 22–32)
CREATININE: 6.44 mg/dL — AB (ref 0.61–1.24)
Chloride: 95 mmol/L — ABNORMAL LOW (ref 98–111)
GFR, EST AFRICAN AMERICAN: 9 mL/min — AB (ref >60)
GFR, EST NON AFRICAN AMERICAN: 8 mL/min — AB (ref >60)
Glucose, Bld: 150 mg/dL — ABNORMAL HIGH (ref 70–99)
Potassium: 3.7 mmol/L (ref 3.5–5.1)
SODIUM: 138 mmol/L (ref 135–145)

## 2018-01-07 LAB — CBC
HEMATOCRIT: 23.5 % — AB (ref 40.0–52.0)
HEMOGLOBIN: 7.8 g/dL — AB (ref 13.0–18.0)
MCH: 30.1 pg (ref 26.0–34.0)
MCHC: 33.2 g/dL (ref 32.0–36.0)
MCV: 90.6 fL (ref 80.0–100.0)
Platelets: 76 10*3/uL — ABNORMAL LOW (ref 150–440)
RBC: 2.59 MIL/uL — AB (ref 4.40–5.90)
RDW: 16.8 % — ABNORMAL HIGH (ref 11.5–14.5)
WBC: 5.8 10*3/uL (ref 3.8–10.6)

## 2018-01-07 LAB — GLUCOSE, CAPILLARY
Glucose-Capillary: 156 mg/dL — ABNORMAL HIGH (ref 70–99)
Glucose-Capillary: 160 mg/dL — ABNORMAL HIGH (ref 70–99)
Glucose-Capillary: 302 mg/dL — ABNORMAL HIGH (ref 70–99)

## 2018-01-07 NOTE — Progress Notes (Signed)
HD tx start    01/07/18 1034  Vital Signs  Pulse Rate 65  Pulse Rate Source Monitor  Resp 17  BP (!) 118/56  BP Location Left Leg  BP Method Automatic  Patient Position (if appropriate) Lying  Oxygen Therapy  SpO2 97 %  O2 Device Nasal Cannula  O2 Flow Rate (L/min) 5 L/min  During Hemodialysis Assessment  Blood Flow Rate (mL/min) 400 mL/min  Arterial Pressure (mmHg) -140 mmHg  Venous Pressure (mmHg) 140 mmHg  Transmembrane Pressure (mmHg) 60 mmHg  Ultrafiltration Rate (mL/min) 710 mL/min  Dialysate Flow Rate (mL/min) 600 ml/min  Conductivity: Machine  13.9  HD Safety Checks Performed Yes  Dialysis Fluid Bolus Normal Saline  Bolus Amount (mL) 250 mL  Intra-Hemodialysis Comments Tx initiated  Fistula / Graft Left Forearm Arteriovenous fistula  No Placement Date or Time found.   Placed prior to admission: No  Orientation: Left  Access Location: Forearm  Access Type: Arteriovenous fistula  Status Accessed  Needle Size 15

## 2018-01-07 NOTE — Progress Notes (Signed)
Dr. Marcille Blanco notified of critical Calcium 6.0 on HD patient; Acknowledged; no new orders; for dialysis today. Zayli Villafuerte K, RN5:20 AM 01/07/2018

## 2018-01-07 NOTE — Progress Notes (Signed)
Post HD assessment. Pt tolerated tx well without c/o or complication. Net UF 2033, goal met. Pt experienced increased bleeding post HD tx, at both arterial and venous HD access sites, MD aware.    01/07/18 1424  Vital Signs  Temp 97.7 F (36.5 C)  Temp Source Oral  Pulse Rate 71  Pulse Rate Source Monitor  Resp 12  BP (!) 146/85  BP Location Left Leg  Patient Position (if appropriate) Lying  Oxygen Therapy  SpO2 100 %  O2 Device Nasal Cannula  O2 Flow Rate (L/min) 5 L/min  Dialysis Weight  Weight 95.9 kg  Type of Weight Post-Dialysis  Post-Hemodialysis Assessment  Rinseback Volume (mL) 250 mL  KECN 79.6 V  Dialyzer Clearance Lightly streaked  Duration of HD Treatment -hour(s) 3.5 hour(s)  Hemodialysis Intake (mL) 500 mL  UF Total -Machine (mL) 2533 mL  Net UF (mL) 2033 mL  Tolerated HD Treatment Yes  AVG/AVF Arterial Site Held (minutes) 15 minutes (INCREASED BLEEDING)  AVG/AVF Venous Site Held (minutes) 15 minutes (INCREASED BLEEDING)  Education / Care Plan  Dialysis Education Provided Yes  Documented Education in Care Plan Yes  Fistula / Graft Left Forearm Arteriovenous fistula  No Placement Date or Time found.   Placed prior to admission: No  Orientation: Left  Access Location: Forearm  Access Type: Arteriovenous fistula  Site Condition No complications (increased bleeding time post HD tx )  Fistula / Graft Assessment Present;Thrill;Bruit  Status Deaccessed  Drainage Description None

## 2018-01-07 NOTE — Progress Notes (Signed)
Post HD assessment    01/07/18 1423  Neurological  Level of Consciousness Alert  Orientation Level Oriented X4  Respiratory  Respiratory Pattern Regular;Unlabored  Chest Assessment Chest expansion symmetrical  Cough Productive  Cardiac  ECG Monitor Yes  Cardiac Rhythm Atrial fibrillation  Vascular  R Radial Pulse +2  L Radial Pulse +2  Edema Generalized  Integumentary  Integumentary (WDL) X  Skin Color Appropriate for ethnicity  Musculoskeletal  Musculoskeletal (WDL) X  Generalized Weakness Yes  Assistive Device None  Psychosocial  Psychosocial (WDL) WDL

## 2018-01-07 NOTE — Progress Notes (Signed)
[facility]  ROUNDING NOTE   Subjective:   Seen and examined on hemodialysis treatment. Tolerating treatment. UF of 2.5 liters.   Patient states he is feeling better and breathing better.     HEMODIALYSIS FLOWSHEET:  Blood Flow Rate (mL/min): 400 mL/min Arterial Pressure (mmHg): -170 mmHg Venous Pressure (mmHg): 150 mmHg Transmembrane Pressure (mmHg): 50 mmHg Ultrafiltration Rate (mL/min): 720 mL/min Dialysate Flow Rate (mL/min): 600 ml/min Conductivity: Machine : 13.9 Conductivity: Machine : 13.9 Dialysis Fluid Bolus: Normal Saline Bolus Amount (mL): 250 mL   Objective:  Vital signs in last 24 hours:  Temp:  [97.6 F (36.4 C)-98.5 F (36.9 C)] 97.7 F (36.5 C) (10/07 1023) Pulse Rate:  [60-79] 60 (10/07 1400) Resp:  [10-22] 10 (10/07 1400) BP: (100-171)/(49-86) 100/86 (10/07 1400) SpO2:  [92 %-100 %] 100 % (10/07 1400) Weight:  [97.9 kg] 97.9 kg (10/07 1023)  Weight change:  Filed Weights   01/03/18 0428 01/04/18 1135 01/07/18 1023  Weight: 92.1 kg 91.7 kg 97.9 kg    Intake/Output: I/O last 3 completed shifts: In: 1182.8 [P.O.:240; I.V.:17.6; Blood:352.5; IV Piggyback:572.8] Out: -    Intake/Output this shift:  Total I/O In: 240 [P.O.:240] Out: -   Physical Exam: General: No acute distress  Head: Normocephalic, atraumatic. Moist oral mucosal membranes  Eyes: Anicteric, left conjunctival erythema  Neck: Supple, trachea midline  Lungs:  Scattered rhonchi, normal effort 5 L [place]  CV: regular  Abdomen:  Soft, nontender, BS present  Extremities: No peripheral edema.  Neurologic: Awake, alert, following commands  Skin: No lesions  Access: Left forearm AVF    Basic Metabolic Panel: Recent Labs  Lab 01/02/18 1904 01/04/18 0804 01/04/18 1526 01/04/18 1850 01/05/18 0701 01/06/18 0506 01/07/18 0421  NA 140 138  --   --  137 135 133*  K 3.1* 3.7  --  3.3* 4.2 4.6 5.7*  CL 98 95*  --   --  94* 94* 92*  CO2 32 28  --   --  28 27 25    GLUCOSE 110* 150*  --   --  339* 255* 194*  BUN 25* 66*  --   --  55* 86* 103*  CREATININE 2.56* 6.44*  --   --  5.09* 6.44* 7.52*  CALCIUM 7.3* 6.2*  --   --  6.6* 6.5* 6.0*  MG  --   --   --  1.7  --   --   --   PHOS 2.1*  --  3.7 2.4*  --   --  6.3*    Liver Function Tests: Recent Labs  Lab 01/02/18 1904 01/07/18 0421  ALBUMIN 3.0* 2.8*   No results for input(s): LIPASE, AMYLASE in the last 168 hours. No results for input(s): AMMONIA in the last 168 hours.  CBC: Recent Labs  Lab 01/04/18 0804 01/05/18 0701 01/06/18 0506 01/06/18 1407 01/06/18 2216 01/07/18 0421  WBC 6.2 4.9 6.8 6.8  --  5.8  NEUTROABS  --  4.5  --   --   --   --   HGB 7.3* 8.0* 7.2* 7.7* 7.9* 7.8*  HCT 21.2* 23.7* 21.4* 22.8*  --  23.5*  MCV 90.9 92.1 91.3 92.5  --  90.6  PLT 67* 59* 70* 77*  --  76*    Cardiac Enzymes: No results for input(s): CKTOTAL, CKMB, CKMBINDEX, TROPONINI in the last 168 hours.  BNP: Invalid input(s): POCBNP  CBG: Recent Labs  Lab 01/06/18 1503 01/06/18 1800 01/06/18 2039 01/06/18 2210 01/07/18 0725  GLUCAP [place]    Microbiology: Results for orders placed or performed during the hospital encounter of 12/30/17  Culture, blood (routine x 2)     Status: None   Collection Time: 12/31/17 12:30 AM  Result Value Ref Range Status   Specimen Description BLOOD RIGHT UPPER ARM  Final   Special Requests   Final    BOTTLES DRAWN AEROBIC AND ANAEROBIC Blood Culture adequate volume   Culture   Final    NO GROWTH 5 DAYS Performed at [facility], [address], [place], [place] [address]    Report Status 01/05/2018 FINAL  Final  Urine culture     Status: None   Collection Time: 12/31/17  1:13 AM  Result Value Ref Range Status   Specimen Description   Final    URINE, RANDOM Performed at [facility], [address] [place], [place] [address]    Special Requests   Final    NONE Performed at [facility], [address], [place], [place] [address]    Culture   Final    NO GROWTH Performed at [facility], [place] [address], [place], [place] [address]    Report Status 01/01/2018 FINAL  Final  Culture, blood (routine x 2)     Status: None   Collection Time: 12/31/17  2:34 AM  Result Value Ref Range Status   Specimen Description BLOOD RIGHT ANTECUBITAL  Final   Special Requests   Final    BOTTLES DRAWN AEROBIC AND ANAEROBIC Blood Culture results may not be optimal due to an excessive volume of blood received in culture bottles   Culture   Final    NO GROWTH 5 DAYS Performed at [facility], [address] [place], [place] [address]    Report Status 01/05/2018 FINAL  Final  MRSA PCR Screening     Status: None   Collection Time: 12/31/17  4:43 AM  Result Value Ref Range Status   MRSA by PCR NEGATIVE NEGATIVE Final    Comment:        The GeneXpert MRSA Assay (FDA approved for NASAL specimens only), is one component of a comprehensive MRSA colonization surveillance program. It is not intended to diagnose MRSA infection nor to guide or monitor treatment for MRSA infections. Performed at [facility], [address] [place], [place] [address]     Coagulation Studies: No results for input(s): LABPROT, INR in the last 72 hours.  Urinalysis: No results for input(s): COLORURINE, LABSPEC, PHURINE, GLUCOSEU, HGBUR, BILIRUBINUR, KETONESUR, PROTEINUR, UROBILINOGEN, NITRITE, LEUKOCYTESUR in the last 72 hours.  Invalid input(s): APPERANCEUR    Imaging: Ct Chest Wo Contrast  Result Date: 01/06/2018 CLINICAL DATA:  Inpatient.  Dyspnea.  History of bladder cancer. EXAM: CT CHEST WITHOUT CONTRAST TECHNIQUE: Multidetector CT imaging of the chest was performed following the standard protocol without IV contrast. COMPARISON:  Chest radiograph from one day prior. 03/07/2016 chest CT. 11/30/2016 PET-CT. FINDINGS: Cardiovascular: Normal heart size. No significant pericardial  effusion/thickening. Right internal jugular MediPort terminates at the cavoatrial junction. Left main and 3 vessel coronary atherosclerosis. Atherosclerotic nonaneurysmal thoracic aorta. Top-normal caliber main pulmonary artery (3.1 cm diameter). Mediastinum/Nodes: No discrete thyroid nodules. Unremarkable esophagus. Mild bilateral axillary adenopathy measuring up to the 1.5 cm on the left (series 2/image 29), mildly decreased from 1.7 cm on 11/30/2016 PET-CT. Enlarged 2.6 cm left supraclavicular node (series 2/image 1), stable. Left prevascular adenopathy up to 1.5 cm (series 2/image 45), stable.  Enlarged 2.3 cm AP window node (series 2/image 52), stable. Right paratracheal adenopathy up to the 2.1 cm (series 2/image 29), decreased from 2.6 cm. Enlarged 2.1 cm subcarinal node (series 2/image 70), stable. Mild bilateral hilar adenopathy is poorly delineated on these noncontrast images and is not definitely changed. Lungs/Pleura: No pneumothorax. Small to moderate right and small left dependent pleural effusions. Moderate right and mild left dependent lower lobe compressive atelectasis. There is extensive patchy peribronchovascular ground-glass opacity and centrilobular ground-glass micronodularity in both lungs with relative sparing of the lung bases. There is scattered mild interlobular septal thickening. No lung masses. Upper abdomen: Nonobstructing 3 mm upper left renal stone. Musculoskeletal: No aggressive appearing focal osseous lesions. Symmetric mild gynecomastia. Mild anasarca. Moderate thoracic spondylosis. IMPRESSION: 1. Small to moderate right and small left dependent pleural effusions. Mild anasarca. Extensive ground-glass opacity and centrilobular ground-glass micronodularity in both lungs with mild interlobular septal thickening, favoring pulmonary edema. These findings suggest fluid overload in this patient with history of dialysis. 2. Extensive thoracic adenopathy in the bilateral axillary, bilateral  mediastinal, bilateral hilar and left supraclavicular regions, stable to decreased since 11/30/2016 PET-CT. 3. Left main and 3 vessel coronary atherosclerosis. Aortic Atherosclerosis (ICD10-I70.0). Electronically Signed   By: Ilona Sorrel M.D.   On: 01/06/2018 11:34     Medications:   . sodium chloride    . sodium chloride    . sodium chloride Stopped (01/05/18 1000)  . ceFEPime (MAXIPIME) IV 200 mL/hr at 01/06/18 1214  . famotidine (PEPCID) IV 20 mg (01/07/18 0801)   . bumetanide  1 mg Intravenous Once  . chlorhexidine  15 mL Mouth Rinse BID  . Difluprednate  1 drop Left Eye BID  . epoetin (EPOGEN/PROCRIT) injection  10,000 Units Intravenous Q M,W,F-HD  . feeding supplement (ENSURE ENLIVE)  237 mL Oral Daily  . gabapentin  100 mg Oral TID  . insulin aspart  0-15 Units Subcutaneous TID WC  . insulin aspart  4 Units Subcutaneous TID WC  . insulin detemir  14 Units Subcutaneous Daily  . ipratropium-albuterol  3 mL Nebulization QID  . mouth rinse  15 mL Mouth Rinse q12n4p  . methylPREDNISolone (SOLU-MEDROL) injection  40 mg Intravenous Q12H  . metoCLOPramide  5 mg Oral TID AC  . multivitamin  1 tablet Oral QHS  . oxymetazoline  1 spray Each Nare BID  . pravastatin  20 mg Oral q1800  . torsemide  100 mg Oral Once per day on Sun Tue Thu Sat   sodium chloride, sodium chloride, sodium chloride, acetaminophen **OR** acetaminophen, albuterol, alteplase, bisacodyl, dextrose, ipratropium-albuterol, lactulose, lidocaine (PF), lidocaine-prilocaine, midodrine, ondansetron **OR** ondansetron (ZOFRAN) IV, pentafluoroprop-tetrafluoroeth, polyvinyl alcohol, senna-docusate, sodium chloride, sodium chloride flush  Assessment/ Plan:  [name]. is a 72 y.o. black male with end stage renal disease on hemodialysis, CLL, diabetes mellitus type II, COPD, history of bladder cancer who is admitted to [facility] on 12/30/2017 for shortness of breath.   [facility] MWF [facility] AVF 90.5kg  1.   ESRD on HD MWF Seen and examined on hemodialysis. Tolerating treatment well.  Shortness of breath is thought to be due to pulmonary edema.   2.  Anemia of chronic kidney disease. Hemoglobin 7.8. Status post multiple transfusions PRBC.  - EPO with HD treatments.   3.  Secondary hyperparathyroidism: with hypocalcemia and hyperphosphatemia. Corrected calcium of 7. Phosphorus of 6.3 - Restart calcium acetate - 3 with meals.   4. Rt femoral DVT -Previously on Eliquis for  this issue.  Had GI bleed this admission. IVC filter placed on 9/30 by Dr. Lucky Cowboy.   5. Hypertension: blood pressure at goal. Diuretics ordered? Patient is baseline anuric.    LOS: 7 Mark [name] 10/7/20192:21 PM

## 2018-01-07 NOTE — Progress Notes (Signed)
Pre HD assessment    01/07/18 1024  Neurological  Level of Consciousness Alert  Orientation Level Oriented X4  Respiratory  Respiratory Pattern Regular;Unlabored  Chest Assessment Chest expansion symmetrical  Cough Non-productive  Cardiac  ECG Monitor Yes  Vascular  R Radial Pulse +2  L Radial Pulse +2  Edema Generalized  Integumentary  Integumentary (WDL) X  Skin Color Appropriate for ethnicity  Musculoskeletal  Musculoskeletal (WDL) X  Generalized Weakness Yes  Assistive Device None  GU Assessment  Genitourinary (WDL) X  Genitourinary Symptoms  (HD)  Psychosocial  Psychosocial (WDL) WDL

## 2018-01-07 NOTE — Clinical Social Work Note (Signed)
CSW met with patient to discuss bed offers. CSW presented bed offers and patient chose [facility]. CSW spoke with patient's daughter Waldo Laine [phone] per patient request. Daughter is in agreement with discharge to [facility] place when medically ready. CSW notified Bernadene Bell of SNF location. CSW will continue to follow for discharge planning.   [place], [place]

## 2018-01-07 NOTE — Progress Notes (Signed)
Patient ID: Regenia Skeeter., male   DOB: 03-15-1946, 72 y.o.   MRN: [id number]     [employer] PROGRESS NOTE  Laretta Alstrom [name]. GHW:[id number] DOB: 09/28/1945 DOA: 12/30/2017 PCP: Albina Billet, MD  HPI/Subjective: Patient complains of breath , denies any chest pain or palpitations  Objective: Vitals:   01/07/18 1345 01/07/18 1400  BP: (!) 146/79 100/86  Pulse: 70 60  Resp: 12 10  Temp:    SpO2: 100% 100%    Filed Weights   01/03/18 0428 01/04/18 1135 01/07/18 1023  Weight: 92.1 kg 91.7 kg 97.9 kg    ROS: Review of Systems  Constitutional: Positive for malaise/fatigue. Negative for chills and fever.  Eyes: Negative for blurred vision.  Respiratory: Positive for cough and shortness of breath.   Cardiovascular: Negative for chest pain.  Gastrointestinal: Negative for abdominal pain, constipation, diarrhea, nausea and vomiting.  Genitourinary: Negative for dysuria.  Musculoskeletal: Negative for joint pain.  Neurological: Negative for dizziness and headaches.   Exam: Physical Exam  Constitutional: He is oriented to person, place, and time.  HENT:  Nose: No mucosal edema.  Mouth/Throat: No oropharyngeal exudate or posterior oropharyngeal edema.  Eyes: Pupils are equal, round, and reactive to light. Conjunctivae, EOM and lids are normal.  Neck: No JVD present. Carotid bruit is not present. No edema present. No thyroid mass and no thyromegaly present.  Cardiovascular: S1 normal and S2 normal. Exam reveals no gallop.  No murmur heard. Pulses:      Dorsalis pedis pulses are 2+ on the right side, and 2+ on the left side.  Respiratory: No respiratory distress. He has decreased breath sounds in the right lower field and the left lower field. He has no wheezes. He has no rhonchi. He has no rales.  GI: Soft. Bowel sounds are normal. There is no tenderness.  Musculoskeletal:       Right ankle: He exhibits no swelling.       Left ankle: He exhibits no swelling.   Lymphadenopathy:    He has no cervical adenopathy.  Neurological: He is alert and oriented to person, place, and time. No cranial nerve deficit.  Skin: Skin is warm. No rash noted. Nails show no clubbing.  Right groin with pressure dressing still on it.  Psychiatric: He has a normal mood and affect.      Data Reviewed: Basic Metabolic Panel: Recent Labs  Lab 01/02/18 1904 01/04/18 0804 01/04/18 1526 01/04/18 1850 01/05/18 0701 01/06/18 0506 01/07/18 0421  NA 140 138  --   --  137 135 133*  K 3.1* 3.7  --  3.3* 4.2 4.6 5.7*  CL 98 95*  --   --  94* 94* 92*  CO2 32 28  --   --  28 27 25   GLUCOSE 110* 150*  --   --  339* 255* 194*  BUN 25* 66*  --   --  55* 86* 103*  CREATININE 2.56* 6.44*  --   --  5.09* 6.44* 7.52*  CALCIUM 7.3* 6.2*  --   --  6.6* 6.5* 6.0*  MG  --   --   --  1.7  --   --   --   PHOS 2.1*  --  3.7 2.4*  --   --  6.3*   Liver Function Tests: Recent Labs  Lab 01/02/18 1904 01/07/18 0421  ALBUMIN 3.0* 2.8*   CBC: Recent Labs  Lab 01/04/18 0804 01/05/18 0701 01/06/18 0506 01/06/18 1407 01/06/18  2216 01/07/18 0421  WBC 6.2 4.9 6.8 6.8  --  5.8  NEUTROABS  --  4.5  --   --   --   --   HGB 7.3* 8.0* 7.2* 7.7* 7.9* 7.8*  HCT 21.2* 23.7* 21.4* 22.8*  --  23.5*  MCV 90.9 92.1 91.3 92.5  --  90.6  PLT 67* 59* 70* 77*  --  76*   Cardiac Enzymes: No results for input(s): CKTOTAL, CKMB, CKMBINDEX, TROPONINI in the last 168 hours.  CBG: Recent Labs  Lab 01/06/18 1503 01/06/18 1800 01/06/18 2039 01/06/18 2210 01/07/18 0725  GLUCAP 403* 377* 406* 387* 156*    Recent Results (from the past 240 hour(s))  Culture, blood (routine x 2)     Status: None   Collection Time: 12/31/17 12:30 AM  Result Value Ref Range Status   Specimen Description BLOOD RIGHT UPPER ARM  Final   Special Requests   Final    BOTTLES DRAWN AEROBIC AND ANAEROBIC Blood Culture adequate volume   Culture   Final    NO GROWTH 5 DAYS Performed at [facility], [address], [place], [place] [address]    Report Status 01/05/2018 FINAL  Final  Urine culture     Status: None   Collection Time: 12/31/17  1:13 AM  Result Value Ref Range Status   Specimen Description   Final    URINE, RANDOM Performed at [facility], [address] [place], [place] [address]    Special Requests   Final    NONE Performed at [facility], [address], [place], [place] [address]    Culture   Final    NO GROWTH Performed at [facility], [place] [address], [place], [place] [address]    Report Status 01/01/2018 FINAL  Final  Culture, blood (routine x 2)     Status: None   Collection Time: 12/31/17  2:34 AM  Result Value Ref Range Status   Specimen Description BLOOD RIGHT ANTECUBITAL  Final   Special Requests   Final    BOTTLES DRAWN AEROBIC AND ANAEROBIC Blood Culture results may not be optimal due to an excessive volume of blood received in culture bottles   Culture   Final    NO GROWTH 5 DAYS Performed at [facility], [address] [place], [place] [address]    Report Status 01/05/2018 FINAL  Final  MRSA PCR Screening     Status: None   Collection Time: 12/31/17  4:43 AM  Result Value Ref Range Status   MRSA by PCR NEGATIVE NEGATIVE Final    Comment:        The GeneXpert MRSA Assay (FDA approved for NASAL specimens only), is one component of a comprehensive MRSA colonization surveillance program. It is not intended to diagnose MRSA infection nor to guide or monitor treatment for MRSA infections. Performed at [facility], [address] [place], [place] [address]      Scheduled Meds: . bumetanide  1 mg Intravenous Once  . chlorhexidine  15 mL Mouth Rinse BID  . Difluprednate  1 drop Left Eye BID  . epoetin (EPOGEN/PROCRIT) injection  10,000 Units Intravenous Q M,W,F-HD  . feeding supplement (ENSURE ENLIVE)  237 mL Oral Daily  . gabapentin  100 mg Oral TID  . insulin aspart  0-15 Units  Subcutaneous TID WC  . insulin aspart  4 Units Subcutaneous TID WC  . insulin detemir  14 Units Subcutaneous Daily  .  ipratropium-albuterol  3 mL Nebulization QID  . mouth rinse  15 mL Mouth Rinse q12n4p  . methylPREDNISolone (SOLU-MEDROL) injection  40 mg Intravenous Q12H  . metoCLOPramide  5 mg Oral TID AC  . multivitamin  1 tablet Oral QHS  . oxymetazoline  1 spray Each Nare BID  . pravastatin  20 mg Oral q1800  . torsemide  100 mg Oral Once per day on Sun Tue Thu Sat   Continuous Infusions: . sodium chloride    . sodium chloride    . sodium chloride Stopped (01/05/18 1000)  . ceFEPime (MAXIPIME) IV 200 mL/hr at 01/06/18 1214  . famotidine (PEPCID) IV 20 mg (01/07/18 0801)    Assessment/Plan:  1. Acute on chronic anemia.  Repeat hemoglobin in morning 2. hyperkalemia.  Resolved 3. Recent diagnosis of DVT with transfusion dependent anemia.  Eliquis on hold.  IVC filter placed. 4. Acute respiratory failure possible pneumonia procalcitonin high continue antibiotics CT also suggests fluid overload 5. type 2 diabetes mellitus continue Levemir  6. history of bladder cancer and CLL. 7. Hyperlipidemia unspecified on pravastatin 8. End-stage renal disease on dialysis 9. History of stroke.  Unable to give any blood thinners 10. DNR    Code Status:     Code Status Orders  (From admission, onward)         Start     Ordered   12/31/17 0441  Do not attempt resuscitation (DNR)  Continuous    Question Answer Comment  In the event of cardiac or respiratory ARREST Do not call a "code blue"   In the event of cardiac or respiratory ARREST Do not perform Intubation, CPR, defibrillation or ACLS   In the event of cardiac or respiratory ARREST Use medication by any route, position, wound care, and other measures to relive pain and suffering. May use oxygen, suction and manual treatment of airway obstruction as needed for comfort.      12/31/17 0440        Code Status History    Date  Active Date Inactive Code Status Order ID Comments User Context   12/31/2017 0212 12/31/2017 0440 DNR [id number]  Arta Silence, MD ED   12/25/2017 0514 12/27/2017 1651 Partial Code [id number]  Harrie Foreman, MD ED   12/18/2017 1441 12/23/2017 1907 Partial Code [id number]  Asencion Gowda, NP Inpatient   01/15/2017 0423 01/18/2017 2120 Full Code [id number]  Saundra Shelling, MD Inpatient   10/20/2016 1627 10/22/2016 1817 Full Code [id number]  Lance Coon, MD Inpatient      Disposition Plan: Patient now interested in rehab  Consultants:  Nephrology  Vascular surgery  Oncology  Time spent: 24 minutes  Kay Ricciuti [employer]

## 2018-01-07 NOTE — Progress Notes (Signed)
Pre HD assessment    01/07/18 1023  Vital Signs  Temp 97.7 F (36.5 C)  Temp Source Oral  Pulse Rate 66  Pulse Rate Source Monitor  Resp 11  BP 128/66  BP Location Left Leg  BP Method Automatic  Patient Position (if appropriate) Lying  Oxygen Therapy  SpO2 99 %  O2 Device Nasal Cannula  O2 Flow Rate (L/min) 5 L/min  Pain Assessment  Pain Scale 0-10  Pain Score 0  Dialysis Weight  Weight 97.9 kg  Type of Weight Pre-Dialysis  Time-Out for Hemodialysis  What Procedure? HD  Pt Identifiers(min of two) First/Last Name;MRN/Account#  Correct Site? Yes  Correct Side? Yes  Correct Procedure? Yes  Consents Verified? Yes  Rad Studies Available? N/A  Safety Precautions Reviewed? Yes  Engineer, civil (consulting) Number  (4A)  Station Number 1  UF/Alarm Test Passed  Conductivity: Meter 14.8  Conductivity: Machine  14.9  pH 7.6  Reverse Osmosis main  Normal Saline Lot Number [id number]  Dialyzer Lot Number [id number]  Disposable Set Lot Number [id number]  Machine Temperature 98.6 F (37 C)  Musician and Audible Yes  Blood Lines Intact and Secured Yes  Pre Treatment Patient Checks  Vascular access used during treatment Fistula  Hepatitis B Surface Antigen Results Negative  Date Hepatitis B Surface Antigen Drawn 12/31/17  Hepatitis B Surface Antibody  (<10)  Date Hepatitis B Surface Antibody Drawn 12/31/17  Hemodialysis Consent Verified Yes  Hemodialysis Standing Orders Initiated Yes  ECG (Telemetry) Monitor On Yes  Prime Ordered Normal Saline  Length of  DialysisTreatment -hour(s) 3.5 Hour(s)  Dialyzer Elisio 17H NR  Dialysate 2K, 2.5 Ca  Dialysis Anticoagulant None  Dialysate Flow Ordered 600  Blood Flow Rate Ordered 400 mL/min  Ultrafiltration Goal 2 Liters  Pre Treatment Labs Phosphorus  Dialysis Blood Pressure Support Ordered Normal Saline  Education / Care Plan  Dialysis Education Provided Yes  Documented Education in Care Plan Yes  Fistula / Graft Left Forearm  Arteriovenous fistula  No Placement Date or Time found.   Placed prior to admission: No  Orientation: Left  Access Location: Forearm  Access Type: Arteriovenous fistula  Site Condition No complications  Fistula / Graft Assessment Present;Thrill;Bruit  Drainage Description None

## 2018-01-07 NOTE — Care Management Important Message (Signed)
Copy of signed IM left in patient's room (out for dialysis).    

## 2018-01-07 NOTE — Progress Notes (Signed)
On-call Hospitalist paged for critical calcium of 6.0; awaiting callback. Barbaraann Faster, RN 5:18 AM 01/07/2018

## 2018-01-07 NOTE — Progress Notes (Signed)
HD tx end    01/07/18 1414  Vital Signs  Pulse Rate 75  Pulse Rate Source Monitor  Resp 13  BP 123/69  BP Location Left Leg  BP Method Automatic  Patient Position (if appropriate) Lying  Oxygen Therapy  SpO2 99 %  O2 Device Nasal Cannula  O2 Flow Rate (L/min) 5 L/min  During Hemodialysis Assessment  Dialysis Fluid Bolus Normal Saline  Bolus Amount (mL) 250 mL  Intra-Hemodialysis Comments Tx completed

## 2018-01-08 ENCOUNTER — Ambulatory Visit: Payer: Medicare HMO | Admitting: Podiatry

## 2018-01-08 DIAGNOSIS — R0602 Shortness of breath: Secondary | ICD-10-CM

## 2018-01-08 LAB — CBC
HCT: 25.4 % — ABNORMAL LOW (ref 40.0–52.0)
Hemoglobin: 8.4 g/dL — ABNORMAL LOW (ref 13.0–18.0)
MCH: 30.6 pg (ref 26.0–34.0)
MCHC: 33.1 g/dL (ref 32.0–36.0)
MCV: 92.4 fL (ref 80.0–100.0)
PLATELETS: 81 10*3/uL — AB (ref 150–440)
RBC: 2.75 MIL/uL — ABNORMAL LOW (ref 4.40–5.90)
RDW: 17.3 % — AB (ref 11.5–14.5)
WBC: 4.9 10*3/uL (ref 3.8–10.6)

## 2018-01-08 LAB — GLUCOSE, CAPILLARY
GLUCOSE-CAPILLARY: 472 mg/dL — AB (ref 70–99)
GLUCOSE-CAPILLARY: 338 mg/dL — AB (ref 70–99)
Glucose-Capillary: 363 mg/dL — ABNORMAL HIGH (ref 70–99)

## 2018-01-08 MED ORDER — GUAIFENESIN ER 600 MG PO TB12
600.0000 mg | ORAL_TABLET | Freq: Two times a day (BID) | ORAL | Status: DC
  Administered 2018-01-08: 600 mg via ORAL

## 2018-01-08 MED ORDER — INSULIN DETEMIR 100 UNIT/ML ~~LOC~~ SOLN: 14.0000 [IU] | Freq: Every day | SUBCUTANEOUS | 11 refills | Status: DC

## 2018-01-08 MED ORDER — IPRATROPIUM-ALBUTEROL 0.5-2.5 (3) MG/3ML IN SOLN: 3.0000 mL | Freq: Four times a day (QID) | RESPIRATORY_TRACT | Status: DC

## 2018-01-08 MED ORDER — INSULIN ASPART 100 UNIT/ML ~~LOC~~ SOLN
15.0000 [IU] | Freq: Once | SUBCUTANEOUS | Status: AC
  Administered 2018-01-08: 13:00:00 15 [IU] via SUBCUTANEOUS
  Filled 2018-01-08: qty 1

## 2018-01-08 MED ORDER — SALINE SPRAY 0.65 % NA SOLN: 1.0000 | NASAL | 0 refills | Status: AC | PRN

## 2018-01-08 MED ORDER — INSULIN ASPART 100 UNIT/ML ~~LOC~~ SOLN: 4.0000 [IU] | Freq: Three times a day (TID) | SUBCUTANEOUS | 11 refills | Status: DC

## 2018-01-08 MED ORDER — PREDNISONE 10 MG (21) PO TBPK: ORAL_TABLET | ORAL | 0 refills | Status: DC

## 2018-01-08 MED ORDER — GUAIFENESIN ER 600 MG PO TB12: 600.0000 mg | ORAL_TABLET | Freq: Two times a day (BID) | ORAL | Status: DC

## 2018-01-08 MED ORDER — IPRATROPIUM-ALBUTEROL 0.5-2.5 (3) MG/3ML IN SOLN
3.0000 mL | Freq: Three times a day (TID) | RESPIRATORY_TRACT | Status: DC
  Filled 2018-01-08: qty 3

## 2018-01-08 MED ORDER — HEPARIN SOD (PORK) LOCK FLUSH 100 UNIT/ML IV SOLN
500.0000 [IU] | Freq: Once | INTRAVENOUS | Status: AC
  Administered 2018-01-08: 18:00:00 500 [IU] via INTRAVENOUS
  Filled 2018-01-08: qty 5

## 2018-01-08 MED ORDER — AMOXICILLIN-POT CLAVULANATE 500-125 MG PO TABS: 1.0000 | ORAL_TABLET | Freq: Every day | ORAL | 0 refills | Status: DC

## 2018-01-08 MED ORDER — AMOXICILLIN-POT CLAVULANATE 875-125 MG PO TABS: 1.0000 | ORAL_TABLET | Freq: Two times a day (BID) | ORAL | 0 refills | Status: DC

## 2018-01-08 MED ORDER — GUAIFENESIN-DM 100-10 MG/5ML PO SYRP: 15.0000 mL | ORAL_SOLUTION | ORAL | 0 refills | Status: DC | PRN

## 2018-01-08 MED ORDER — MIDODRINE HCL 10 MG PO TABS: 10.0000 mg | ORAL_TABLET | ORAL | Status: AC | PRN

## 2018-01-08 NOTE — Plan of Care (Signed)
  Problem: Education: Goal: Knowledge of General Education information will improve Description Including pain rating scale, medication(s)/side effects and non-pharmacologic comfort measures Outcome: Progressing   Problem: Health Behavior/Discharge Planning: Goal: Ability to manage health-related needs will improve Outcome: Progressing   Problem: Clinical Measurements: Goal: Ability to maintain clinical measurements within normal limits will improve Outcome: Progressing Goal: Will remain free from infection Outcome: Progressing Goal: Diagnostic test results will improve Outcome: Progressing Goal: Respiratory complications will improve Outcome: Progressing   Problem: Activity: Goal: Risk for activity intolerance will decrease Outcome: Progressing   Problem: Elimination: Goal: Will not experience complications related to bowel motility Outcome: Progressing Goal: Will not experience complications related to urinary retention Outcome: Progressing   Problem: Pain Managment: Goal: General experience of comfort will improve Outcome: Progressing   Problem: Safety: Goal: Ability to remain free from injury will improve Outcome: Progressing

## 2018-01-08 NOTE — Progress Notes (Signed)
Pt for discharge. No distress. [facility]ms here to transport pt. Report called to don at [facility].  Port  Flushed and deaccessed per protocol. tol well.

## 2018-01-08 NOTE — Discharge Summary (Signed)
[place] [facility] [place] 72 y.o., DOB 1945-08-20, MRN [id number]. Admission date: 12/30/2017 Discharge Date 01/08/2018 Primary MD Albina Billet, MD Admitting Physician Arta Silence, MD  Admission Diagnosis  Hyperkalemia [E87.5] Acute pulmonary edema (HCC) [J81.0] Hyperglycemia [R73.9] Elevated troponin [R79.89] ESRD (end stage renal disease) on dialysis ([place]) [N18.6, Z99.2] Symptomatic anemia [D64.9] Altered mental status, unspecified altered mental status type [R41.82] Acute on chronic respiratory failure with hypoxemia ([place]) [J96.21]  Discharge Diagnosis   Active Problems: Acute on chronic anemia due to nasal bleed Hyperkalemia Recent diagnosis of DVT status post IVC filter placed Acute respiratory failure with pneumonia Diabetes type 2 History of bladder cancer CLL Hyperlipidemia End-stage renal disease Previous history of stroke   Hospital Course  Patient is a 72 year old with multiple medical problems who was admitted with altered mental status and worsening anemia.  Patient was transfused.  He has been on Eliquis and he is required transfusion therefore his Eliquis was discontinued.  He had IVC filter placed for DVT.  Patient also was noted to have acute on chronic respiratory failure and was noted to have pneumonia which was treated.  Patient is very weak and deconditioned need of rehab.   3 L oxygen at all time Oral antibiotics with Augmentin for 2          Consults  nephrology  Significant Tests:  See full reports for all details     Dg Chest 2 View  Result Date: 12/24/2017 CLINICAL DATA:  Acute onset shortness of breath. EXAM: CHEST - 2 VIEW COMPARISON:  12/18/2017 and 12/15/2017 FINDINGS: Lungs are adequately inflated demonstrate a small right-sided pleural effusion likely with associated right basilar atelectasis unchanged. Interval improved left base opacification. Cardiomediastinal silhouette and  remainder of the exam is unchanged. IMPRESSION: Stable small right pleural effusion likely with associated right basilar atelectasis. Near resolution of left base opacification. Electronically Signed   By: Marin Olp M.D.   On: 12/24/2017 21:43   Ct Head Wo Contrast  Result Date: 12/31/2017 CLINICAL DATA:  Confusion, combative. Headache. On Eliquis. History of lymphoma, end-stage renal disease on dialysis, hypertension, stroke. EXAM: CT HEAD WITHOUT CONTRAST TECHNIQUE: Contiguous axial images were obtained from the base of the skull through the vertex without intravenous contrast. COMPARISON:  CT HEAD October 15, 2013 FINDINGS: BRAIN: No intraparenchymal hemorrhage, mass effect nor midline shift. The ventricles and sulci are normal for age. Minimal supratentorial white matter hypodensities less than expected for patient's age, though non-specific are most compatible with chronic small vessel ischemic disease. No acute large vascular territory infarcts. No abnormal extra-axial fluid collections. Basal cisterns are patent. VASCULAR: Moderate to severe calcific atherosclerosis of the carotid siphons. SKULL: No skull fracture. No significant scalp soft tissue swelling. SINUSES/ORBITS: Trace paranasal sinus mucosal thickening. Mastoid air cells are well aerated.The included ocular globes and orbital contents are non-suspicious. Status post bilateral ocular lens implants. OTHER: None. IMPRESSION: Normal noncontrast CT HEAD for age. Electronically Signed   By: Elon Alas M.D.   On: 12/31/2017 01:13   Ct Chest Wo Contrast  Result Date: 01/06/2018 CLINICAL DATA:  Inpatient.  Dyspnea.  History of bladder cancer. EXAM: CT CHEST WITHOUT CONTRAST TECHNIQUE: Multidetector CT imaging of the chest was performed following the standard protocol without IV contrast. COMPARISON:  Chest radiograph from one day prior. 03/07/2016 chest CT. 11/30/2016 PET-CT. FINDINGS: Cardiovascular: Normal heart size. No significant  pericardial effusion/thickening. Right internal jugular MediPort terminates at the cavoatrial junction. Left main  and 3 vessel coronary atherosclerosis. Atherosclerotic nonaneurysmal thoracic aorta. Top-normal caliber main pulmonary artery (3.1 cm diameter). Mediastinum/Nodes: No discrete thyroid nodules. Unremarkable esophagus. Mild bilateral axillary adenopathy measuring up to the 1.5 cm on the left (series 2/image 29), mildly decreased from 1.7 cm on 11/30/2016 PET-CT. Enlarged 2.6 cm left supraclavicular node (series 2/image 1), stable. Left prevascular adenopathy up to 1.5 cm (series 2/image 45), stable. Enlarged 2.3 cm AP window node (series 2/image 52), stable. Right paratracheal adenopathy up to the 2.1 cm (series 2/image 29), decreased from 2.6 cm. Enlarged 2.1 cm subcarinal node (series 2/image 70), stable. Mild bilateral hilar adenopathy is poorly delineated on these noncontrast images and is not definitely changed. Lungs/Pleura: No pneumothorax. Small to moderate right and small left dependent pleural effusions. Moderate right and mild left dependent lower lobe compressive atelectasis. There is extensive patchy peribronchovascular ground-glass opacity and centrilobular ground-glass micronodularity in both lungs with relative sparing of the lung bases. There is scattered mild interlobular septal thickening. No lung masses. Upper abdomen: Nonobstructing 3 mm upper left renal stone. Musculoskeletal: No aggressive appearing focal osseous lesions. Symmetric mild gynecomastia. Mild anasarca. Moderate thoracic spondylosis. IMPRESSION: 1. Small to moderate right and small left dependent pleural effusions. Mild anasarca. Extensive ground-glass opacity and centrilobular ground-glass micronodularity in both lungs with mild interlobular septal thickening, favoring pulmonary edema. These findings suggest fluid overload in this patient with history of dialysis. 2. Extensive thoracic adenopathy in the bilateral  axillary, bilateral mediastinal, bilateral hilar and left supraclavicular regions, stable to decreased since 11/30/2016 PET-CT. 3. Left main and 3 vessel coronary atherosclerosis. Aortic Atherosclerosis (ICD10-I70.0). Electronically Signed   By: Ilona Sorrel M.D.   On: 01/06/2018 11:34   Nm Pulmonary Perf And Vent  Result Date: 12/25/2017 CLINICAL DATA:  Chest pain. EXAM: NUCLEAR MEDICINE VENTILATION - PERFUSION LUNG SCAN TECHNIQUE: Ventilation images were obtained in multiple projections using inhaled aerosol Tc-44m DTPA. Perfusion images were obtained in multiple projections after intravenous injection of Tc-54m-MAA. RADIOPHARMACEUTICALS:  34.4 mCi of Tc-14m DTPA aerosol inhalation and 4.3 mCi Tc2m-MAA IV COMPARISON:  Chest x-Mark 12/24/2017. FINDINGS: No ventilation perfusion mismatches noted. Mild ventilatory defects and perfusion defects are noted within the tori defects being larger. These are primarily in the bases and most likely related to previously identified basilar atelectasis and pleural effusions. IMPRESSION: Low probability pulmonary embolus. Electronically Signed   By: Marcello Moores  Register   On: 12/25/2017 11:58   US Venous Img Lower Bilateral  Result Date: 12/25/2017 CLINICAL DATA:  Bilateral lower extremity pain/cramping for 1 day with dyspnea. EXAM: BILATERAL LOWER EXTREMITY VENOUS DOPPLER ULTRASOUND TECHNIQUE: Gray-scale sonography with graded compression, as well as color Doppler and duplex ultrasound were performed to evaluate the lower extremity deep venous systems from the level of the common femoral vein and including the common femoral, femoral, profunda femoral, popliteal and calf veins including the posterior tibial, peroneal and gastrocnemius veins when visible. The superficial great saphenous vein was also interrogated. Spectral Doppler was utilized to evaluate flow at rest and with distal augmentation maneuvers in the common femoral, femoral and popliteal veins. COMPARISON:  Left  lower extremity venous Doppler scan dated 01/15/2017 FINDINGS: RIGHT LOWER EXTREMITY Common Femoral Vein: No evidence of thrombus. Normal compressibility, respiratory phasicity and response to augmentation. Saphenofemoral Junction: No evidence of thrombus. Normal compressibility and flow on color Doppler imaging. Profunda Femoral Vein: No evidence of thrombus. Normal compressibility and flow on color Doppler imaging. Femoral Vein: Nonocclusive thrombosis of the midportion of the right femoral vein. Patent compressible  proximal and distal portions of the right femoral vein. Popliteal Vein: No evidence of thrombus. Normal compressibility, respiratory phasicity and response to augmentation. Calf Veins: No evidence of thrombus. Normal compressibility and flow on color Doppler imaging. Superficial Great Saphenous Vein: No evidence of thrombus. Normal compressibility. Venous Reflux:  None. Other Findings:  None. LEFT LOWER EXTREMITY Common Femoral Vein: No evidence of thrombus. Normal compressibility, respiratory phasicity and response to augmentation. Saphenofemoral Junction: No evidence of thrombus. Normal compressibility and flow on color Doppler imaging. Profunda Femoral Vein: No evidence of thrombus. Normal compressibility and flow on color Doppler imaging. Femoral Vein: No evidence of thrombus. Normal compressibility, respiratory phasicity and response to augmentation. Popliteal Vein: No evidence of thrombus. Normal compressibility, respiratory phasicity and response to augmentation. Calf Veins: No evidence of thrombus. Normal compressibility and flow on color Doppler imaging. Superficial Great Saphenous Vein: No evidence of thrombus. Normal compressibility. Venous Reflux:  None. Other Findings:  None. IMPRESSION: 1. Nonocclusive deep venous thrombosis of the midportion of the right femoral vein. 2. Otherwise patent deep veins of the lower extremities bilaterally. Electronically Signed   By: Ilona Sorrel M.D.   On:  12/25/2017 03:00   Dg Chest Port 1 View  Result Date: 01/05/2018 CLINICAL DATA:  Shortness of breath, respiratory failure EXAM: PORTABLE CHEST 1 VIEW COMPARISON:  01/04/2018 FINDINGS: Right Port-A-Cath remains in place, unchanged. Diffuse bilateral airspace opacities and small right effusion, stable since prior study. Heart is borderline in size. IMPRESSION: Stable diffuse bilateral airspace opacities and small right effusion. Electronically Signed   By: Rolm Baptise M.D.   On: 01/05/2018 08:25   Dg Chest Port 1 View  Result Date: 01/04/2018 CLINICAL DATA:  Short of breath on exertion EXAM: PORTABLE CHEST 1 VIEW COMPARISON:  12/30/2017 FINDINGS: Progressive bilateral airspace disease with basilar predominance. Progression of small right effusion. Interval placement of Port-A-Cath with the tip in the right atrium. No pneumothorax Port-A-Cath tip in the right atrium Progressive bilateral airspace disease and small right effusion. IMPRESSION: No active disease. Electronically Signed   By: Franchot Gallo M.D.   On: 01/04/2018 11:10   Dg Chest Port 1 View  Result Date: 12/30/2017 CLINICAL DATA:  Weakness. EXAM: PORTABLE CHEST 1 VIEW COMPARISON:  Most recent radiograph 12/24/2017, 12/18/2017. Additional priors. FINDINGS: Low lung volumes. Increased pulmonary edema from prior exam. Small right pleural effusion is grossly unchanged. Streaky left lung base atelectasis. Mild cardiomegaly is similar to priors. No pneumothorax. IMPRESSION: Increased pulmonary edema from prior exam. Small right pleural effusion is similar. Electronically Signed   By: Keith Rake M.D.   On: 12/30/2017 23:50   Dg Chest Port 1 View  Result Date: 12/18/2017 CLINICAL DATA:  Shortness of breath. EXAM: PORTABLE CHEST 1 VIEW COMPARISON:  Radiograph of December 15, 2017. FINDINGS: The heart size and mediastinal contours are within normal limits. Stable bilateral pulmonary edema is noted. Mild bilateral pleural effusions are noted  which are increased compared to prior exam. No pneumothorax is noted. Atherosclerosis of thoracic aorta is noted. The visualized skeletal structures are unremarkable. IMPRESSION: Bilateral pulmonary edema with mild bilateral pleural effusions. Aortic Atherosclerosis (ICD10-I70.0). Electronically Signed   By: Marijo Conception, M.D.   On: 12/18/2017 15:10   Dg Chest Port 1 View  Result Date: 12/15/2017 CLINICAL DATA:  Altered mental status with cough. EXAM: PORTABLE CHEST 1 VIEW COMPARISON:  01/15/2017 and PET-CT 11/30/2016 FINDINGS: Lungs are adequately inflated and demonstrate hazy prominence of the perihilar markings likely interstitial edema. No effusion. Cardiomediastinal silhouette  and remainder the exam is unchanged. IMPRESSION: Findings suggesting mild interstitial edema. Electronically Signed   By: Marin Olp M.D.   On: 12/15/2017 21:06       Today   Subjective:   Mark [name] patient doing better shortness of breath improved Objective:   Blood pressure 134/64, pulse 60, temperature 97.6 F (36.4 C), temperature source Oral, resp. rate 20, height 5\' 11"  (1.803 m), weight 95.9 kg, SpO2 95 %.  .  Intake/Output Summary (Last 24 hours) at 01/08/2018 1239 Last data filed at 01/07/2018 1424 Gross per 24 hour  Intake -  Output 2033 ml  Net -2033 ml    Exam VITAL SIGNS: Blood pressure 134/64, pulse 60, temperature 97.6 F (36.4 C), temperature source Oral, resp. rate 20, height 5\' 11"  (1.803 m), weight 95.9 kg, SpO2 95 %.  GENERAL:  72 y.o.-year-old patient lying in the bed with no acute distress.  EYES: Pupils equal, round, reactive to light and accommodation. No scleral icterus. Extraocular muscles intact.  HEENT: Head atraumatic, normocephalic. Oropharynx and nasopharynx clear.  NECK:  Supple, no jugular venous distention. No thyroid enlargement, no tenderness.  LUNGS: Normal breath sounds bilaterally, no wheezing, rales,rhonchi or crepitation. No use of accessory muscles of  respiration.  CARDIOVASCULAR: S1, S2 normal. No murmurs, rubs, or gallops.  ABDOMEN: Soft, nontender, nondistended. Bowel sounds present. No organomegaly or mass.  EXTREMITIES: No pedal edema, cyanosis, or clubbing.  NEUROLOGIC: Cranial nerves II through XII are intact. Muscle strength 5/5 in all extremities. Sensation intact. Gait not checked.  PSYCHIATRIC: The patient is alert and oriented x 3.  SKIN: No obvious rash, lesion, or ulcer.   Data Review     CBC w Diff:  Lab Results  Component Value Date   WBC 4.9 01/08/2018   HGB 8.4 (L) 01/08/2018   HGB 9.9 (L) 11/18/2013   HCT 25.4 (L) 01/08/2018   HCT 29.3 (L) 11/18/2013   PLT 81 (L) 01/08/2018   PLT 190 11/18/2013   LYMPHOPCT 7 01/05/2018   LYMPHOPCT 22.1 11/18/2013   MONOPCT 1 01/05/2018   MONOPCT 11.4 11/18/2013   EOSPCT 0 01/05/2018   EOSPCT 5.7 11/18/2013   BASOPCT 0 01/05/2018   BASOPCT 0.7 11/18/2013   CMP:  Lab Results  Component Value Date   NA 133 (L) 01/07/2018   NA 140 11/18/2013   K 5.7 (H) 01/07/2018   K 4.0 11/18/2013   CL 92 (L) 01/07/2018   CL 97 (L) 11/18/2013   CO2 25 01/07/2018   CO2 33 (H) 11/18/2013   BUN 103 (H) 01/07/2018   BUN 30 (H) 11/18/2013   CREATININE 7.52 (H) 01/07/2018   CREATININE 5.05 (H) 11/18/2013   PROT 5.6 (L) 12/31/2017   PROT 7.0 11/18/2013   ALBUMIN 2.8 (L) 01/07/2018   ALBUMIN 3.7 11/18/2013   BILITOT 1.0 12/31/2017   BILITOT 0.3 11/18/2013   ALKPHOS 76 12/31/2017   ALKPHOS 105 11/18/2013   AST 20 12/31/2017   AST 15 11/18/2013   ALT 24 12/31/2017   ALT 25 11/18/2013  .  Micro Results Recent Results (from the past 240 hour(s))  Culture, blood (routine x 2)     Status: None   Collection Time: 12/31/17 12:30 AM  Result Value Ref Range Status   Specimen Description BLOOD RIGHT UPPER ARM  Final   Special Requests   Final    BOTTLES DRAWN AEROBIC AND ANAEROBIC Blood Culture adequate volume   Culture   Final    NO GROWTH 5 DAYS Performed  at [facility], [address] [place], [place] [address]    Report Status 01/05/2018 FINAL  Final  Urine culture     Status: None   Collection Time: 12/31/17  1:13 AM  Result Value Ref Range Status   Specimen Description   Final    URINE, RANDOM Performed at [facility], [address] [place], [place] [address]    Special Requests   Final    NONE Performed at [facility], [address] [place], [place] [address]    Culture   Final    NO GROWTH Performed at [facility], [place] [address], [place], [place] [address]    Report Status 01/01/2018 FINAL  Final  Culture, blood (routine x 2)     Status: None   Collection Time: 12/31/17  2:34 AM  Result Value Ref Range Status   Specimen Description BLOOD RIGHT ANTECUBITAL  Final   Special Requests   Final    BOTTLES DRAWN AEROBIC AND ANAEROBIC Blood Culture results may not be optimal due to an excessive volume of blood received in culture bottles   Culture   Final    NO GROWTH 5 DAYS Performed at [facility], [address] [place], [place] [address]    Report Status 01/05/2018 FINAL  Final  MRSA PCR Screening     Status: None   Collection Time: 12/31/17  4:43 AM  Result Value Ref Range Status   MRSA by PCR NEGATIVE NEGATIVE Final    Comment:        The GeneXpert MRSA Assay (FDA approved for NASAL specimens only), is one component of a comprehensive MRSA colonization surveillance program. It is not intended to diagnose MRSA infection nor to guide or monitor treatment for MRSA infections. Performed at [facility], [address] [place], [place] [address]         Code Status Orders  (From admission, onward)         Start     Ordered   12/31/17 0441  Do not attempt resuscitation (DNR)  Continuous    Question Answer Comment  In the event of cardiac or respiratory ARREST Do not call a "code blue"   In the event of cardiac or respiratory ARREST Do not perform Intubation, CPR,  defibrillation or ACLS   In the event of cardiac or respiratory ARREST Use medication by any route, position, wound care, and other measures to relive pain and suffering. May use oxygen, suction and manual treatment of airway obstruction as needed for comfort.      12/31/17 0440        Code Status History    Date Active Date Inactive Code Status Order ID Comments User Context   12/31/2017 0212 12/31/2017 0440 DNR [id number]  Arta Silence, MD ED   12/25/2017 0514 12/27/2017 1651 Partial Code [id number]  Harrie Foreman, MD ED   12/18/2017 1441 12/23/2017 1907 Partial Code [id number]  Asencion Gowda, NP Inpatient   01/15/2017 0423 01/18/2017 2120 Full Code [id number]  Saundra Shelling, MD Inpatient   10/20/2016 1627 10/22/2016 1817 Full Code [id number]  Lance Coon, MD Inpatient    Advance Directive Documentation     Most Recent Value  Type of Advance Directive  Healthcare Power of Attorney  Pre-existing out of facility DNR order (yellow form or pink MOST form)  -  "MOST" Form in Place?  -          Contact information for after-discharge  care    Destination    [facility] Preferred SNF .   Service:  Skilled Nursing Contact information: [address] [place] [phone]              Discharge Medications   Allergies as of 01/08/2018      Reactions   Heparin Nausea Only, Other (See Comments)   Excessive bleeding per pt.    Ibuprofen Other (See Comments)   DUE TO DIALYSIS   Multivitamin [centrum] Other (See Comments)   DUE TO DIALYSIS   Daypro [oxaprozin] Swelling, Rash   Other reaction(s): Other (See Comments)   Tape Rash      Medication List    STOP taking these medications   apixaban 5 MG Tabs tablet Commonly known as:  ELIQUIS   predniSONE 10 MG tablet Commonly known as:  DELTASONE Replaced by:  predniSONE 10 MG (21) Tbpk tablet     TAKE these medications   albuterol (2.5 MG/3ML) 0.083% nebulizer  solution Commonly known as:  PROVENTIL Take 2.5 mg by nebulization every 6 (six) hours as needed for wheezing or shortness of breath.   amoxicillin-clavulanate 875-125 MG tablet Commonly known as:  AUGMENTIN Take 1 tablet by mouth 2 (two) times daily for 5 days.   budesonide-formoterol 160-4.5 MCG/ACT inhaler Commonly known as:  SYMBICORT Inhale 2 puffs into the lungs 2 (two) times daily.   docusate sodium 100 MG capsule Commonly known as:  COLACE Take 100 mg by mouth daily as needed for mild constipation.   DUREZOL 0.05 % Emul Generic drug:  Difluprednate Place 1 drop into the left eye 2 (two) times daily.   ENSURE PLUS Liqd Take 1 Can by mouth daily.   fluticasone 50 MCG/ACT nasal spray Commonly known as:  FLONASE Place 1 spray into both nostrils daily as needed for allergies.   gabapentin 100 MG capsule Commonly known as:  NEURONTIN Take 1 capsule (100 mg total) by mouth 3 (three) times daily.   glimepiride 4 MG tablet Commonly known as:  AMARYL Take 4 mg by mouth at bedtime. Prn glucose levels   guaiFENesin 600 MG 12 hr tablet Commonly known as:  MUCINEX Take 1 tablet (600 mg total) by mouth 2 (two) times daily.   guaiFENesin-dextromethorphan 100-10 MG/5ML syrup Commonly known as:  ROBITUSSIN DM Take 15 mLs by mouth every 4 (four) hours as needed for cough.   insulin aspart 100 UNIT/ML injection Commonly known as:  novoLOG Inject 4 Units into the skin 3 (three) times daily with meals.   ipratropium-albuterol 0.5-2.5 (3) MG/3ML Soln Commonly known as:  DUONEB Take 3 mLs by nebulization 4 (four) times daily.   LEVEMIR FLEXTOUCH 100 UNIT/ML Pen Generic drug:  Insulin Detemir Inject 5-6 Units into the skin daily at 10 pm. What changed:  Another medication with the same name was added. Make sure you understand how and when to take each.   insulin detemir 100 UNIT/ML injection Commonly known as:  LEVEMIR Inject 0.14 mLs (14 Units total) into the skin  daily. Start taking on:  01/09/2018 What changed:  You were already taking a medication with the same name, and this prescription was added. Make sure you understand how and when to take each.   lovastatin 20 MG tablet Commonly known as:  MEVACOR Take 1 tablet by mouth daily.   metoCLOPramide 5 MG tablet Commonly known as:  REGLAN Take 5 mg by mouth 3 (three) times daily before meals. Patient takes 1 tab with  each meal.   midodrine 10 MG tablet Commonly known as:  PROAMATINE Take 1 tablet (10 mg total) by mouth every dialysis (MWF).   multivitamin Tabs tablet Take 1 tablet by mouth at bedtime.   nystatin cream Commonly known as:  MYCOSTATIN Apply 1 application topically 2 (two) times daily as needed (irritation).   predniSONE 10 MG (21) Tbpk tablet Commonly known as:  STERAPRED UNI-PAK 21 TAB Start at 60mg  taper by 10mg  until complete Replaces:  predniSONE 10 MG tablet   REFRESH 1.4-0.6 % Soln Generic drug:  Polyvinyl Alcohol-Povidone PF Apply 1 drop to eye 3 (three) times daily as needed (Dry eyes).   silver sulfADIAZINE 1 % cream Commonly known as:  SILVADENE APPLY 1 APPLICATION TOPICALY DAILY What changed:  See the new instructions.   sodium chloride 0.65 % Soln nasal spray Commonly known as:  OCEAN Place 1 spray into both nostrils as needed for congestion.   torsemide 100 MG tablet Commonly known as:  DEMADEX Take 1 tablet (100 mg total) by mouth daily. Please take on Non dialysis days- Tues, Thurs, Saturday and sunday            Durable Medical Equipment  (From admission, onward)         Start     Ordered   01/08/18 1236  DME Oxygen  Once    Question Answer Comment  Mode or (Route) Nasal cannula   Liters per Minute 3   Oxygen delivery system Gas      10 /08/19 1236             Total Time in preparing paper work, data evaluation and todays exam - 35 minutes  Dustin Flock M.D on 01/08/2018 at 12:39 PM [place]   [phone]

## 2018-01-08 NOTE — Clinical Social Work Note (Signed)
Patient is medically ready for discharge today. CSW notified patient and daughter Waldo Laine [phone] of discharge today. CSW also notified Linus Orn at [facility] of discharge today. CSW received insurance authorization for patient and gave information to [place] at [facility]. Patient will be transported by EMS. RN to call report and call for transport.   [place], [place]

## 2018-01-08 NOTE — Progress Notes (Signed)
Pt left via ems  At 1800 for [facility]. No resp distress.03 3l [place]  Can get sob with increased exertion. Port  Flushed and deaccessed per protocol.   No voiced c/o

## 2018-01-08 NOTE — Progress Notes (Signed)
[facility]  ROUNDING NOTE   Subjective:   Speaking to Palliative care.   Hemodialysis treatment yesterday. Tolerated treatment well. UF of 2 liters  4 L [place]  Objective:  Vital signs in last 24 hours:  Temp:  [97.6 F (36.4 C)-98.2 F (36.8 C)] 97.6 F (36.4 C) (10/08 0456) Pulse Rate:  [60-75] 60 (10/08 0456) Resp:  [10-20] 20 (10/08 0456) BP: (100-171)/(49-86) 134/64 (10/08 0456) SpO2:  [95 %-100 %] 95 % (10/08 0456) Weight:  [95.9 kg] 95.9 kg (10/07 1424)  Weight change:  Filed Weights   01/04/18 1135 01/07/18 1023 01/07/18 1424  Weight: 91.7 kg 97.9 kg 95.9 kg    Intake/Output: I/O last 3 completed shifts: In: 1024.8 [P.O.:240; I.V.:10; Blood:352.5; IV Piggyback:422.3] Out: 2033 [Other:2033]   Intake/Output this shift:  No intake/output data recorded.  Physical Exam: General: No acute distress  Head: Normocephalic, atraumatic. Moist oral mucosal membranes  Eyes: Anicteric, left conjunctival erythema  Neck: Supple, trachea midline  Lungs:  +rhonchi, normal effort 4 L [place]  CV: regular  Abdomen:  Soft, nontender, BS present  Extremities: No peripheral edema.  Neurologic: Awake, alert, following commands  Skin: No lesions  Access: Left forearm AVF    Basic Metabolic Panel: Recent Labs  Lab 01/02/18 1904 01/04/18 0804 01/04/18 1526 01/04/18 1850 01/05/18 0701 01/06/18 0506 01/07/18 0421  NA 140 138  --   --  137 135 133*  K 3.1* 3.7  --  3.3* 4.2 4.6 5.7*  CL 98 95*  --   --  94* 94* 92*  CO2 32 28  --   --  28 27 25   GLUCOSE 110* 150*  --   --  339* 255* 194*  BUN 25* 66*  --   --  55* 86* 103*  CREATININE 2.56* 6.44*  --   --  5.09* 6.44* 7.52*  CALCIUM 7.3* 6.2*  --   --  6.6* 6.5* 6.0*  MG  --   --   --  1.7  --   --   --   PHOS 2.1*  --  3.7 2.4*  --   --  6.3*    Liver Function Tests: Recent Labs  Lab 01/02/18 1904 01/07/18 0421  ALBUMIN 3.0* 2.8*   No results for input(s): LIPASE, AMYLASE in the last 168 hours. No  results for input(s): AMMONIA in the last 168 hours.  CBC: Recent Labs  Lab 01/05/18 0701 01/06/18 0506 01/06/18 1407 01/06/18 2216 01/07/18 0421 01/08/18 0605  WBC 4.9 6.8 6.8  --  5.8 4.9  NEUTROABS 4.5  --   --   --   --   --   HGB 8.0* 7.2* 7.7* 7.9* 7.8* 8.4*  HCT 23.7* 21.4* 22.8*  --  23.5* 25.4*  MCV 92.1 91.3 92.5  --  90.6 92.4  PLT 59* 70* 77*  --  76* 81*    Cardiac Enzymes: No results for input(s): CKTOTAL, CKMB, CKMBINDEX, TROPONINI in the last 168 hours.  BNP: Invalid input(s): POCBNP  CBG: Recent Labs  Lab 01/06/18 2210 01/07/18 0725 01/07/18 1637 01/07/18 2107 01/08/18 0727  GLUCAP 387* 156* 160* 302* 13*    Microbiology: Results for orders placed or performed during the hospital encounter of 12/30/17  Culture, blood (routine x 2)     Status: None   Collection Time: 12/31/17 12:30 AM  Result Value Ref Range Status   Specimen Description BLOOD RIGHT UPPER ARM  Final   Special Requests   Final  BOTTLES DRAWN AEROBIC AND ANAEROBIC Blood Culture adequate volume   Culture   Final    NO GROWTH 5 DAYS Performed at [facility], [address] [place], [place] [address]    Report Status 01/05/2018 FINAL  Final  Urine culture     Status: None   Collection Time: 12/31/17  1:13 AM  Result Value Ref Range Status   Specimen Description   Final    URINE, RANDOM Performed at [facility], [address] [place], [place] [address]    Special Requests   Final    NONE Performed at [facility], [address], [place], [place] [address]    Culture   Final    NO GROWTH Performed at [facility], [place] [address], [place], [place] [address]    Report Status 01/01/2018 FINAL  Final  Culture, blood (routine x 2)     Status: None   Collection Time: 12/31/17  2:34 AM  Result Value Ref Range Status   Specimen Description BLOOD RIGHT ANTECUBITAL  Final   Special Requests   Final    BOTTLES DRAWN AEROBIC AND ANAEROBIC  Blood Culture results may not be optimal due to an excessive volume of blood received in culture bottles   Culture   Final    NO GROWTH 5 DAYS Performed at [facility], [address] [place], [place] [address]    Report Status 01/05/2018 FINAL  Final  MRSA PCR Screening     Status: None   Collection Time: 12/31/17  4:43 AM  Result Value Ref Range Status   MRSA by PCR NEGATIVE NEGATIVE Final    Comment:        The GeneXpert MRSA Assay (FDA approved for NASAL specimens only), is one component of a comprehensive MRSA colonization surveillance program. It is not intended to diagnose MRSA infection nor to guide or monitor treatment for MRSA infections. Performed at [facility], [address] [place], [place] [address]     Coagulation Studies: No results for input(s): LABPROT, INR in the last 72 hours.  Urinalysis: No results for input(s): COLORURINE, LABSPEC, PHURINE, GLUCOSEU, HGBUR, BILIRUBINUR, KETONESUR, PROTEINUR, UROBILINOGEN, NITRITE, LEUKOCYTESUR in the last 72 hours.  Invalid input(s): APPERANCEUR    Imaging: Ct Chest Wo Contrast  Result Date: 01/06/2018 CLINICAL DATA:  Inpatient.  Dyspnea.  History of bladder cancer. EXAM: CT CHEST WITHOUT CONTRAST TECHNIQUE: Multidetector CT imaging of the chest was performed following the standard protocol without IV contrast. COMPARISON:  Chest radiograph from one day prior. 03/07/2016 chest CT. 11/30/2016 PET-CT. FINDINGS: Cardiovascular: Normal heart size. No significant pericardial effusion/thickening. Right internal jugular MediPort terminates at the cavoatrial junction. Left main and 3 vessel coronary atherosclerosis. Atherosclerotic nonaneurysmal thoracic aorta. Top-normal caliber main pulmonary artery (3.1 cm diameter). Mediastinum/Nodes: No discrete thyroid nodules. Unremarkable esophagus. Mild bilateral axillary adenopathy measuring up to the 1.5 cm on the left (series 2/image 29), mildly decreased from  1.7 cm on 11/30/2016 PET-CT. Enlarged 2.6 cm left supraclavicular node (series 2/image 1), stable. Left prevascular adenopathy up to 1.5 cm (series 2/image 45), stable. Enlarged 2.3 cm AP window node (series 2/image 52), stable. Right paratracheal adenopathy up to the 2.1 cm (series 2/image 29), decreased from 2.6 cm. Enlarged 2.1 cm subcarinal node (series 2/image 70), stable. Mild bilateral hilar adenopathy is poorly delineated on these noncontrast images and is not definitely changed. Lungs/Pleura: No pneumothorax. Small to moderate right and small left dependent pleural effusions. Moderate right and mild  left dependent lower lobe compressive atelectasis. There is extensive patchy peribronchovascular ground-glass opacity and centrilobular ground-glass micronodularity in both lungs with relative sparing of the lung bases. There is scattered mild interlobular septal thickening. No lung masses. Upper abdomen: Nonobstructing 3 mm upper left renal stone. Musculoskeletal: No aggressive appearing focal osseous lesions. Symmetric mild gynecomastia. Mild anasarca. Moderate thoracic spondylosis. IMPRESSION: 1. Small to moderate right and small left dependent pleural effusions. Mild anasarca. Extensive ground-glass opacity and centrilobular ground-glass micronodularity in both lungs with mild interlobular septal thickening, favoring pulmonary edema. These findings suggest fluid overload in this patient with history of dialysis. 2. Extensive thoracic adenopathy in the bilateral axillary, bilateral mediastinal, bilateral hilar and left supraclavicular regions, stable to decreased since 11/30/2016 PET-CT. 3. Left main and 3 vessel coronary atherosclerosis. Aortic Atherosclerosis (ICD10-I70.0). Electronically Signed   By: Ilona Sorrel M.D.   On: 01/06/2018 11:34     Medications:   . sodium chloride    . sodium chloride    . sodium chloride Stopped (01/05/18 1000)  . ceFEPime (MAXIPIME) IV 1 g (01/07/18 1531)  .  famotidine (PEPCID) IV 20 mg (01/08/18 0834)   . bumetanide  1 mg Intravenous Once  . chlorhexidine  15 mL Mouth Rinse BID  . Difluprednate  1 drop Left Eye BID  . epoetin (EPOGEN/PROCRIT) injection  10,000 Units Intravenous Q M,W,F-HD  . feeding supplement (ENSURE ENLIVE)  237 mL Oral Daily  . gabapentin  100 mg Oral TID  . insulin aspart  0-15 Units Subcutaneous TID WC  . insulin aspart  4 Units Subcutaneous TID WC  . insulin detemir  14 Units Subcutaneous Daily  . ipratropium-albuterol  3 mL Nebulization QID  . mouth rinse  15 mL Mouth Rinse q12n4p  . methylPREDNISolone (SOLU-MEDROL) injection  40 mg Intravenous Q12H  . metoCLOPramide  5 mg Oral TID AC  . multivitamin  1 tablet Oral QHS  . pravastatin  20 mg Oral q1800  . torsemide  100 mg Oral Once per day on Sun Tue Thu Sat   sodium chloride, sodium chloride, sodium chloride, acetaminophen **OR** acetaminophen, albuterol, alteplase, bisacodyl, dextrose, ipratropium-albuterol, lactulose, lidocaine (PF), lidocaine-prilocaine, midodrine, ondansetron **OR** ondansetron (ZOFRAN) IV, pentafluoroprop-tetrafluoroeth, polyvinyl alcohol, senna-docusate, sodium chloride, sodium chloride flush  Assessment/ Plan:  [name]. is a 72 y.o. black male with end stage renal disease on hemodialysis, CLL, diabetes mellitus type II, COPD, history of bladder cancer who is admitted to [facility] on 12/30/2017 for shortness of breath.   [facility] MWF [facility] L AVF 90.5kg  1.  ESRD on HD MWF - Hemodialysis treatment for tomorrow.   2.  Anemia of chronic kidney disease. Hemoglobin 8.4. Status post multiple transfusions PRBC.  - EPO with HD treatments.   3.  Secondary hyperparathyroidism: with hypocalcemia and hyperphosphatemia. Corrected calcium of 7. Phosphorus of 6.3 - Restarted calcium acetate - 3 with meals.   4. Rt femoral DVT -Previously on Eliquis for this issue.  Had GI bleed this admission. IVC filter placed on 9/30 by Dr. Lucky Cowboy.    5. Hypotension: blood pressure at goal. 134/64 - Midodrine before hemodialysis - Discontinue diuretics.    LOS: 8 Mark [name] 10/8/201911:08 AM

## 2018-01-08 NOTE — Progress Notes (Signed)
Daily Progress Note   Patient Name: Mark [name].       Date: 01/08/2018 DOB: Apr 27, 1945  Age: 72 y.o. MRN#: [id number] Attending Physician: Dustin Flock, MD Primary Care Physician: Albina Billet, MD Admit Date: 12/30/2017  Reason for Consultation/Follow-up: Psychosocial/spiritual support  Subjective: Patient moved out of ICU. Patient is in good spirits today, and jokes with the staff. He is happy to have been "cleaned out" and states he feels much better after having a BM. He tells me he did not take his Gabapentin this morning because it makes him sleepy, and he wanted to be in a good place for PT. He states when he misses doses his hands will burn, but after he takes the pill, the burning stops after 20- 30 minutes. He tells me that he has sleep apnea and would like to be fit for a CPAP but has either not been out of the hospital long enough or his breathing was not in a place to do the test.   He is eager for discharge and states if rehab does not work out, he does not want to live in a facility for the rest of his life.  The difference between an aggressive medical intervention path and a comfort care path was discussed. Concept of Hospice and Palliative Care were discussed.  Natural trajectory and expectations at EOL were discussed.  Questions and concerns addressed.     Recommend palliative to follow outpatient with transition to hospice care if the patient chooses to stop dialysis.    Length of Stay: 8  Current Medications: Scheduled Meds:  . bumetanide  1 mg Intravenous Once  . chlorhexidine  15 mL Mouth Rinse BID  . Difluprednate  1 drop Left Eye BID  . epoetin (EPOGEN/PROCRIT) injection  10,000 Units Intravenous Q M,W,F-HD  . feeding supplement (ENSURE ENLIVE)  237 mL  Oral Daily  . gabapentin  100 mg Oral TID  . insulin aspart  0-15 Units Subcutaneous TID WC  . insulin aspart  4 Units Subcutaneous TID WC  . insulin detemir  14 Units Subcutaneous Daily  . ipratropium-albuterol  3 mL Nebulization QID  . mouth rinse  15 mL Mouth Rinse q12n4p  . methylPREDNISolone (SOLU-MEDROL) injection  40 mg Intravenous Q12H  . metoCLOPramide  5 mg Oral TID AC  .  multivitamin  1 tablet Oral QHS  . pravastatin  20 mg Oral q1800    Continuous Infusions: . sodium chloride    . sodium chloride    . sodium chloride Stopped (01/05/18 1000)  . ceFEPime (MAXIPIME) IV 1 g (01/07/18 1531)  . famotidine (PEPCID) IV 20 mg (01/08/18 0834)    PRN Meds: sodium chloride, sodium chloride, sodium chloride, acetaminophen **OR** acetaminophen, albuterol, alteplase, bisacodyl, dextrose, ipratropium-albuterol, lactulose, lidocaine (PF), lidocaine-prilocaine, midodrine, ondansetron **OR** ondansetron (ZOFRAN) IV, pentafluoroprop-tetrafluoroeth, polyvinyl alcohol, senna-docusate, sodium chloride, sodium chloride flush  Physical Exam  Constitutional: No distress.  Pulmonary/Chest: Effort normal.  Neurological: He is alert.            Vital Signs: BP 134/64 (BP Location: Left Leg)   Pulse 60   Temp 97.6 F (36.4 C) (Oral)   Resp 20   Ht 5\' 11"  (1.803 m)   Wt 95.9 kg   SpO2 95%   BMI 29.49 kg/m  SpO2: SpO2: 95 % O2 Device: O2 Device: Nasal Cannula O2 Flow Rate: O2 Flow Rate (L/min): 4 L/min  Intake/output summary:   Intake/Output Summary (Last 24 hours) at 01/08/2018 1121 Last data filed at 01/07/2018 1424 Gross per 24 hour  Intake -  Output 2033 ml  Net -2033 ml   LBM: Last BM Date: 01/07/18 Baseline Weight: Weight: 74.8 kg Most recent weight: Weight: 95.9 kg       Palliative Assessment/Data: 40%      Patient Active Problem List   Diagnosis Date Noted  . Symptomatic anemia 12/31/2017  . Acute on chronic respiratory failure with hypoxemia ([place]) 12/31/2017  .  Hypoxia 12/25/2017  . Pressure injury of skin 12/16/2017  . Fluid overload 01/15/2017  . UTI (urinary tract infection) 10/20/2016  . Sepsis ([place]) 10/20/2016  . 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 Ulcer of foot, chronic ([place]) 03/21/2015  . History of bladder cancer 11/10/2014  . Penile bleeding 10/12/2014  . Dependence on renal dialysis ([place]) 08/18/2013  . Arterial blood pressure decreased 08/18/2013  . Leukemia ([place]) 08/18/2013  . Retina disorder 08/18/2013  . Breath shortness 08/18/2013  . End-stage renal disease on hemodialysis ([place]) 12/23/2012  . Absolute anemia 07/18/2012  . HPTH (hyperparathyroidism) ([place]) 07/18/2012  . Acidosis, metabolic 41/66/0630  . Hyperparathyroidism ([place]) 07/18/2012  . Abnormal presence of protein in urine 01/01/2012  . HLD (hyperlipidemia) 12/25/2011  . Diabetic retinopathy associated with type 2 diabetes mellitus ([place]) 10/26/1999  . Essential (primary) hypertension 04/04/1999  . Diabetes mellitus type 2, uncontrolled ([place]) 04/04/1999  . Type 2 diabetes mellitus with other diabetic kidney complication ([place]) 16/04/930    Palliative Care Assessment & Plan   Recommendations/Plan:  Continue current care.   D/C with palliative  Recommend sleep study when able for CPAP 2/2 possible OSA.     Code Status:    Code Status Orders  (From admission, onward)         Start     Ordered   12/31/17 0441  Do not attempt resuscitation (DNR)  Continuous    Question Answer Comment  In the event of cardiac or respiratory ARREST Do not call a "code blue"   In the event of cardiac or respiratory ARREST Do not perform Intubation, CPR, defibrillation or ACLS   In the event of cardiac or respiratory ARREST Use medication by any route, position, wound care, and other measures to relive pain and suffering. May use oxygen, suction and  manual treatment of airway obstruction as needed  for comfort.      12/31/17 0440        Code Status History    Date Active Date Inactive Code Status Order ID Comments User Context   12/31/2017 0212 12/31/2017 0440 DNR [id number]  Arta Silence, MD ED   12/25/2017 0514 12/27/2017 1651 Partial Code [id number]  Harrie Foreman, MD ED   12/18/2017 1441 12/23/2017 1907 Partial Code [id number]  Asencion Gowda, NP Inpatient   01/15/2017 0423 01/18/2017 2120 Full Code [id number]  Saundra Shelling, MD Inpatient   10/20/2016 1627 10/22/2016 1817 Full Code [id number]  Lance Coon, MD Inpatient    Advance Directive Documentation     Most Recent Value  Type of Advance Directive  Healthcare Power of Attorney  Pre-existing out of facility DNR order (yellow form or pink MOST form)  -  "MOST" Form in Place?  -       Prognosis:   Poor.   Discharge Planning:  To Be Determined   Thank you for allowing the Palliative Medicine Team to assist in the care of this patient.   Total Time 35 min Prolonged Time Billed  NO      Greater than 50%  of this time was spent counseling and coordinating care related to the above assessment and plan.  Asencion Gowda, NP  Please contact Palliative Medicine Team phone at [phone] for questions and concerns.

## 2018-01-12 ENCOUNTER — Emergency Department (HOSPITAL_COMMUNITY): Payer: Medicare HMO

## 2018-01-12 ENCOUNTER — Inpatient Hospital Stay (HOSPITAL_COMMUNITY)
Admission: EM | Admit: 2018-01-12 | Discharge: 2018-01-15 | DRG: 871 | Disposition: A | Discharge status: Home Health | Payer: Medicare HMO | Source: Skilled Nursing Facility | Attending: Internal Medicine | Admitting: Internal Medicine

## 2018-01-12 ENCOUNTER — Other Ambulatory Visit: Payer: Self-pay

## 2018-01-12 ENCOUNTER — Encounter (HOSPITAL_COMMUNITY): Payer: Self-pay

## 2018-01-12 DIAGNOSIS — N186 End stage renal disease: Secondary | ICD-10-CM | POA: Diagnosis present

## 2018-01-12 DIAGNOSIS — I5033 Acute on chronic diastolic (congestive) heart failure: Secondary | ICD-10-CM | POA: Diagnosis present

## 2018-01-12 DIAGNOSIS — E785 Hyperlipidemia, unspecified: Secondary | ICD-10-CM | POA: Diagnosis present

## 2018-01-12 DIAGNOSIS — D631 Anemia in chronic kidney disease: Secondary | ICD-10-CM | POA: Diagnosis present

## 2018-01-12 DIAGNOSIS — R6520 Severe sepsis without septic shock: Secondary | ICD-10-CM | POA: Diagnosis not present

## 2018-01-12 DIAGNOSIS — A419 Sepsis, unspecified organism: Principal | ICD-10-CM | POA: Diagnosis present

## 2018-01-12 DIAGNOSIS — E11649 Type 2 diabetes mellitus with hypoglycemia without coma: Secondary | ICD-10-CM | POA: Diagnosis present

## 2018-01-12 DIAGNOSIS — E1122 Type 2 diabetes mellitus with diabetic chronic kidney disease: Secondary | ICD-10-CM | POA: Diagnosis present

## 2018-01-12 DIAGNOSIS — J206 Acute bronchitis due to rhinovirus: Secondary | ICD-10-CM | POA: Diagnosis present

## 2018-01-12 DIAGNOSIS — Z8579 Personal history of other malignant neoplasms of lymphoid, hematopoietic and related tissues: Secondary | ICD-10-CM

## 2018-01-12 DIAGNOSIS — Z7951 Long term (current) use of inhaled steroids: Secondary | ICD-10-CM

## 2018-01-12 DIAGNOSIS — J9601 Acute respiratory failure with hypoxia: Secondary | ICD-10-CM | POA: Diagnosis present

## 2018-01-12 DIAGNOSIS — Z86718 Personal history of other venous thrombosis and embolism: Secondary | ICD-10-CM

## 2018-01-12 DIAGNOSIS — J9600 Acute respiratory failure, unspecified whether with hypoxia or hypercapnia: Secondary | ICD-10-CM | POA: Diagnosis present

## 2018-01-12 DIAGNOSIS — Z961 Presence of intraocular lens: Secondary | ICD-10-CM | POA: Diagnosis present

## 2018-01-12 DIAGNOSIS — Y95 Nosocomial condition: Secondary | ICD-10-CM | POA: Diagnosis present

## 2018-01-12 DIAGNOSIS — Z91048 Other nonmedicinal substance allergy status: Secondary | ICD-10-CM

## 2018-01-12 DIAGNOSIS — H547 Unspecified visual loss: Secondary | ICD-10-CM | POA: Diagnosis present

## 2018-01-12 DIAGNOSIS — R5381 Other malaise: Secondary | ICD-10-CM | POA: Diagnosis present

## 2018-01-12 DIAGNOSIS — Z79899 Other long term (current) drug therapy: Secondary | ICD-10-CM

## 2018-01-12 DIAGNOSIS — Z87891 Personal history of nicotine dependence: Secondary | ICD-10-CM

## 2018-01-12 DIAGNOSIS — Z9842 Cataract extraction status, left eye: Secondary | ICD-10-CM

## 2018-01-12 DIAGNOSIS — Z9981 Dependence on supplemental oxygen: Secondary | ICD-10-CM

## 2018-01-12 DIAGNOSIS — Z992 Dependence on renal dialysis: Secondary | ICD-10-CM | POA: Diagnosis not present

## 2018-01-12 DIAGNOSIS — Z9841 Cataract extraction status, right eye: Secondary | ICD-10-CM

## 2018-01-12 DIAGNOSIS — K589 Irritable bowel syndrome without diarrhea: Secondary | ICD-10-CM | POA: Diagnosis present

## 2018-01-12 DIAGNOSIS — R509 Fever, unspecified: Secondary | ICD-10-CM | POA: Diagnosis present

## 2018-01-12 DIAGNOSIS — I132 Hypertensive heart and chronic kidney disease with heart failure and with stage 5 chronic kidney disease, or end stage renal disease: Secondary | ICD-10-CM | POA: Diagnosis present

## 2018-01-12 DIAGNOSIS — E1165 Type 2 diabetes mellitus with hyperglycemia: Secondary | ICD-10-CM | POA: Diagnosis not present

## 2018-01-12 DIAGNOSIS — Z8551 Personal history of malignant neoplasm of bladder: Secondary | ICD-10-CM

## 2018-01-12 DIAGNOSIS — E1140 Type 2 diabetes mellitus with diabetic neuropathy, unspecified: Secondary | ICD-10-CM | POA: Diagnosis present

## 2018-01-12 DIAGNOSIS — J189 Pneumonia, unspecified organism: Secondary | ICD-10-CM | POA: Diagnosis present

## 2018-01-12 DIAGNOSIS — Z8701 Personal history of pneumonia (recurrent): Secondary | ICD-10-CM

## 2018-01-12 DIAGNOSIS — Z8673 Personal history of transient ischemic attack (TIA), and cerebral infarction without residual deficits: Secondary | ICD-10-CM

## 2018-01-12 DIAGNOSIS — J9621 Acute and chronic respiratory failure with hypoxia: Secondary | ICD-10-CM | POA: Diagnosis present

## 2018-01-12 DIAGNOSIS — Z95828 Presence of other vascular implants and grafts: Secondary | ICD-10-CM

## 2018-01-12 DIAGNOSIS — Z886 Allergy status to analgesic agent status: Secondary | ICD-10-CM

## 2018-01-12 DIAGNOSIS — G9341 Metabolic encephalopathy: Secondary | ICD-10-CM

## 2018-01-12 DIAGNOSIS — Z794 Long term (current) use of insulin: Secondary | ICD-10-CM

## 2018-01-12 DIAGNOSIS — K219 Gastro-esophageal reflux disease without esophagitis: Secondary | ICD-10-CM | POA: Diagnosis present

## 2018-01-12 DIAGNOSIS — R000 Tachycardia, unspecified: Secondary | ICD-10-CM | POA: Diagnosis present

## 2018-01-12 DIAGNOSIS — Z888 Allergy status to other drugs, medicaments and biological substances status: Secondary | ICD-10-CM

## 2018-01-12 HISTORY — DX: End stage renal disease: N18.6

## 2018-01-12 HISTORY — DX: End stage renal disease: Z99.2

## 2018-01-12 LAB — CBC WITH DIFFERENTIAL/PLATELET
ABS IMMATURE GRANULOCYTES: 0.01 10*3/uL (ref 0.00–0.07)
BASOS ABS: 0 10*3/uL (ref 0.0–0.1)
Basophils Relative: 0 %
Eosinophils Absolute: 0 10*3/uL (ref 0.0–0.5)
Eosinophils Relative: 1 %
HEMATOCRIT: 24.4 % — AB (ref 39.0–52.0)
HEMOGLOBIN: 7.5 g/dL — AB (ref 13.0–17.0)
IMMATURE GRANULOCYTES: 0 %
Lymphocytes Relative: 9 %
Lymphs Abs: 0.3 10*3/uL — ABNORMAL LOW (ref 0.7–4.0)
MCH: 29.5 pg (ref 26.0–34.0)
MCHC: 30.7 g/dL (ref 30.0–36.0)
MCV: 96.1 fL (ref 80.0–100.0)
MONOS PCT: 2 %
Monocytes Absolute: 0.1 10*3/uL (ref 0.1–1.0)
NEUTROS ABS: 3 10*3/uL (ref 1.7–7.7)
NEUTROS PCT: 88 %
Platelets: 108 10*3/uL — ABNORMAL LOW (ref 150–400)
RBC: 2.54 MIL/uL — ABNORMAL LOW (ref 4.22–5.81)
RDW: 16.9 % — ABNORMAL HIGH (ref 11.5–15.5)
WBC: 3.4 10*3/uL — ABNORMAL LOW (ref 4.0–10.5)
nRBC: 0 % (ref 0.0–0.2)

## 2018-01-12 LAB — I-STAT ARTERIAL BLOOD GAS, ED
Acid-Base Excess: 5 mmol/L — ABNORMAL HIGH (ref 0.0–2.0)
BICARBONATE: 29.1 mmol/L — AB (ref 20.0–28.0)
O2 SAT: 100 %
PCO2 ART: 42.7 mmHg (ref 32.0–48.0)
Patient temperature: 104
TCO2: 30 mmol/L (ref 22–32)
pH, Arterial: 7.452 — ABNORMAL HIGH (ref 7.350–7.450)
pO2, Arterial: 171 mmHg — ABNORMAL HIGH (ref 83.0–108.0)

## 2018-01-12 LAB — PROTIME-INR
INR: 1.41
Prothrombin Time: 17.1 seconds — ABNORMAL HIGH (ref 11.4–15.2)

## 2018-01-12 LAB — PHOSPHORUS: Phosphorus: 3.9 mg/dL (ref 2.5–4.6)

## 2018-01-12 LAB — GLUCOSE, CAPILLARY
Glucose-Capillary: 162 mg/dL — ABNORMAL HIGH (ref 70–99)
Glucose-Capillary: 28 mg/dL — CL (ref 70–99)
Glucose-Capillary: 171 mg/dL — ABNORMAL HIGH (ref 70–99)
Glucose-Capillary: 193 mg/dL — ABNORMAL HIGH (ref 70–99)
Glucose-Capillary: 212 mg/dL — ABNORMAL HIGH (ref 70–99)

## 2018-01-12 LAB — I-STAT CG4 LACTIC ACID, ED: LACTIC ACID, VENOUS: 1.66 mmol/L (ref 0.5–1.9)

## 2018-01-12 LAB — URINALYSIS, ROUTINE W REFLEX MICROSCOPIC
Bilirubin Urine: NEGATIVE
GLUCOSE, UA: 50 mg/dL — AB
KETONES UR: NEGATIVE mg/dL
LEUKOCYTES UA: NEGATIVE
Nitrite: NEGATIVE
PH: 5 (ref 5.0–8.0)
Protein, ur: >=300 mg/dL — AB
RBC / HPF: >50 RBC/hpf — ABNORMAL HIGH (ref 0–5)
Specific Gravity, Urine: 1.014 (ref 1.005–1.030)

## 2018-01-12 LAB — COMPREHENSIVE METABOLIC PANEL
ALT: 20 U/L (ref 0–44)
AST: 19 U/L (ref 15–41)
Albumin: 2.5 g/dL — ABNORMAL LOW (ref 3.5–5.0)
Alkaline Phosphatase: 56 U/L (ref 38–126)
Anion gap: 13 (ref 5–15)
BUN: 42 mg/dL — AB (ref 8–23)
CHLORIDE: 98 mmol/L (ref 98–111)
CO2: 26 mmol/L (ref 22–32)
Calcium: 6.4 mg/dL — CL (ref 8.9–10.3)
Creatinine, Ser: 4.28 mg/dL — ABNORMAL HIGH (ref 0.61–1.24)
GFR calc Af Amer: 15 mL/min — ABNORMAL LOW (ref >60)
GFR, EST NON AFRICAN AMERICAN: 13 mL/min — AB (ref >60)
GLUCOSE: 170 mg/dL — AB (ref 70–99)
POTASSIUM: 4.4 mmol/L (ref 3.5–5.1)
Sodium: 137 mmol/L (ref 135–145)
Total Bilirubin: 1 mg/dL (ref 0.3–1.2)
Total Protein: 5.2 g/dL — ABNORMAL LOW (ref 6.5–8.1)

## 2018-01-12 LAB — INFLUENZA PANEL BY PCR (TYPE A & B)
INFLAPCR: NEGATIVE
INFLBPCR: NEGATIVE

## 2018-01-12 LAB — MAGNESIUM: Magnesium: 1.8 mg/dL (ref 1.7–2.4)

## 2018-01-12 MED ORDER — DEXTROSE 50 % IV SOLN
INTRAVENOUS | Status: AC
  Administered 2018-01-12: 11:00:00
  Filled 2018-01-12: qty 50

## 2018-01-12 MED ORDER — PREDNISONE 20 MG PO TABS
30.0000 mg | ORAL_TABLET | Freq: Every day | ORAL | Status: AC
  Administered 2018-01-13: 30 mg via ORAL
  Filled 2018-01-12: qty 1

## 2018-01-12 MED ORDER — ENSURE ENLIVE PO LIQD
237.0000 mL | Freq: Every day | ORAL | Status: DC
  Administered 2018-01-12 – 2018-01-15 (×3): 237 mL via ORAL

## 2018-01-12 MED ORDER — INSULIN DETEMIR 100 UNIT/ML ~~LOC~~ SOLN: 14.0000 [IU] | Freq: Every day | SUBCUTANEOUS | Status: DC

## 2018-01-12 MED ORDER — DIFLUPREDNATE 0.05 % OP EMUL: 1.0000 [drp] | Freq: Two times a day (BID) | OPHTHALMIC | Status: DC

## 2018-01-12 MED ORDER — METOCLOPRAMIDE HCL 5 MG PO TABS
5.0000 mg | ORAL_TABLET | Freq: Three times a day (TID) | ORAL | Status: DC
  Administered 2018-01-12 – 2018-01-15 (×8): 5 mg via ORAL
  Filled 2018-01-12 (×8): qty 1

## 2018-01-12 MED ORDER — INSULIN DETEMIR 100 UNIT/ML FLEXPEN: 5.0000 [IU] | PEN_INJECTOR | Freq: Every day | SUBCUTANEOUS | Status: DC

## 2018-01-12 MED ORDER — OSELTAMIVIR PHOSPHATE 30 MG PO CAPS
30.0000 mg | ORAL_CAPSULE | Freq: Two times a day (BID) | ORAL | Status: DC
  Filled 2018-01-12: qty 1

## 2018-01-12 MED ORDER — TORSEMIDE 20 MG PO TABS
100.0000 mg | ORAL_TABLET | ORAL | Status: DC
  Administered 2018-01-13 – 2018-01-15 (×2): 100 mg via ORAL
  Filled 2018-01-12 (×2): qty 5

## 2018-01-12 MED ORDER — MIDODRINE HCL 5 MG PO TABS
10.0000 mg | ORAL_TABLET | ORAL | Status: DC
  Administered 2018-01-14: 10 mg via ORAL
  Filled 2018-01-12 (×2): qty 2

## 2018-01-12 MED ORDER — SALINE SPRAY 0.65 % NA SOLN
1.0000 | NASAL | Status: DC | PRN
  Filled 2018-01-12: qty 44

## 2018-01-12 MED ORDER — INSULIN DETEMIR 100 UNIT/ML ~~LOC~~ SOLN
10.0000 [IU] | Freq: Two times a day (BID) | SUBCUTANEOUS | Status: DC
  Filled 2018-01-12: qty 0.1

## 2018-01-12 MED ORDER — ACETAMINOPHEN 650 MG RE SUPP
650.0000 mg | RECTAL | Status: AC
  Administered 2018-01-12: 650 mg via RECTAL
  Filled 2018-01-12: qty 1

## 2018-01-12 MED ORDER — SODIUM CHLORIDE 0.9% FLUSH
10.0000 mL | INTRAVENOUS | Status: DC | PRN
  Administered 2018-01-14: 10 mL
  Filled 2018-01-12: qty 40

## 2018-01-12 MED ORDER — DOCUSATE SODIUM 100 MG PO CAPS: 100.0000 mg | ORAL_CAPSULE | Freq: Every day | ORAL | Status: DC | PRN

## 2018-01-12 MED ORDER — MIDODRINE HCL 5 MG PO TABS
10.0000 mg | ORAL_TABLET | Freq: Once | ORAL | Status: AC
  Administered 2018-01-12: 10 mg via ORAL

## 2018-01-12 MED ORDER — GABAPENTIN 100 MG PO CAPS
100.0000 mg | ORAL_CAPSULE | Freq: Three times a day (TID) | ORAL | Status: DC
  Administered 2018-01-13 – 2018-01-15 (×5): 100 mg via ORAL
  Filled 2018-01-12 (×7): qty 1

## 2018-01-12 MED ORDER — FLUTICASONE PROPIONATE 50 MCG/ACT NA SUSP
1.0000 | Freq: Every day | NASAL | Status: DC | PRN
  Filled 2018-01-12: qty 16

## 2018-01-12 MED ORDER — CHLORHEXIDINE GLUCONATE CLOTH 2 % EX PADS
6.0000 | MEDICATED_PAD | Freq: Every day | CUTANEOUS | Status: DC
  Administered 2018-01-13: 6 via TOPICAL

## 2018-01-12 MED ORDER — VANCOMYCIN HCL IN DEXTROSE 1-5 GM/200ML-% IV SOLN: 1000.0000 mg | INTRAVENOUS | Status: DC

## 2018-01-12 MED ORDER — PREDNISOLONE ACETATE 1 % OP SUSP
1.0000 [drp] | Freq: Two times a day (BID) | OPHTHALMIC | Status: DC
  Administered 2018-01-12 – 2018-01-15 (×7): 1 [drp] via OPHTHALMIC
  Filled 2018-01-12: qty 5

## 2018-01-12 MED ORDER — POLYVINYL ALCOHOL 1.4 % OP SOLN
1.0000 [drp] | Freq: Three times a day (TID) | OPHTHALMIC | Status: DC | PRN
  Filled 2018-01-12: qty 15

## 2018-01-12 MED ORDER — VANCOMYCIN HCL 10 G IV SOLR
2000.0000 mg | Freq: Once | INTRAVENOUS | Status: AC
  Administered 2018-01-12: 2000 mg via INTRAVENOUS
  Filled 2018-01-12: qty 2000

## 2018-01-12 MED ORDER — IPRATROPIUM-ALBUTEROL 0.5-2.5 (3) MG/3ML IN SOLN
3.0000 mL | Freq: Four times a day (QID) | RESPIRATORY_TRACT | Status: DC | PRN
  Administered 2018-01-13 – 2018-01-14 (×4): 3 mL via RESPIRATORY_TRACT
  Filled 2018-01-12 (×4): qty 3

## 2018-01-12 MED ORDER — VANCOMYCIN HCL IN DEXTROSE 1-5 GM/200ML-% IV SOLN: 1000.0000 mg | Freq: Once | INTRAVENOUS | Status: DC

## 2018-01-12 MED ORDER — INSULIN ASPART 100 UNIT/ML ~~LOC~~ SOLN: 4.0000 [IU] | Freq: Three times a day (TID) | SUBCUTANEOUS | Status: DC

## 2018-01-12 MED ORDER — RENA-VITE PO TABS
1.0000 | ORAL_TABLET | Freq: Every day | ORAL | Status: DC
  Administered 2018-01-12 – 2018-01-14 (×3): 1 via ORAL
  Filled 2018-01-12 (×3): qty 1

## 2018-01-12 MED ORDER — SODIUM CHLORIDE 0.9 % IV SOLN
2.0000 g | Freq: Once | INTRAVENOUS | Status: AC
  Administered 2018-01-12: 2 g via INTRAVENOUS
  Filled 2018-01-12: qty 2

## 2018-01-12 MED ORDER — MIDODRINE HCL 5 MG PO TABS
ORAL_TABLET | ORAL | Status: AC
  Filled 2018-01-12: qty 2

## 2018-01-12 MED ORDER — INSULIN ASPART 100 UNIT/ML ~~LOC~~ SOLN: 0.0000 [IU] | Freq: Every day | SUBCUTANEOUS | Status: DC

## 2018-01-12 MED ORDER — MOMETASONE FURO-FORMOTEROL FUM 200-5 MCG/ACT IN AERO
2.0000 | INHALATION_SPRAY | Freq: Two times a day (BID) | RESPIRATORY_TRACT | Status: DC
  Filled 2018-01-12 (×2): qty 8.8

## 2018-01-12 MED ORDER — HEPARIN SODIUM (PORCINE) 5000 UNIT/ML IJ SOLN: 5000.0000 [IU] | Freq: Three times a day (TID) | INTRAMUSCULAR | Status: DC

## 2018-01-12 MED ORDER — PREDNISONE 20 MG PO TABS
40.0000 mg | ORAL_TABLET | Freq: Every day | ORAL | Status: AC
  Administered 2018-01-12: 40 mg via ORAL
  Filled 2018-01-12: qty 2

## 2018-01-12 MED ORDER — GLIMEPIRIDE 4 MG PO TABS: 4.0000 mg | ORAL_TABLET | Freq: Every day | ORAL | Status: DC

## 2018-01-12 MED ORDER — SODIUM CHLORIDE 0.9 % IV SOLN
1.0000 g | INTRAVENOUS | Status: DC
  Administered 2018-01-12: 1 g via INTRAVENOUS
  Filled 2018-01-12: qty 1

## 2018-01-12 MED ORDER — PREDNISONE 20 MG PO TABS
20.0000 mg | ORAL_TABLET | Freq: Every day | ORAL | Status: AC
  Administered 2018-01-14: 20 mg via ORAL
  Filled 2018-01-12: qty 1

## 2018-01-12 MED ORDER — PREDNISONE 10 MG PO TABS
10.0000 mg | ORAL_TABLET | Freq: Every day | ORAL | Status: AC
  Administered 2018-01-15: 10 mg via ORAL
  Filled 2018-01-12: qty 1

## 2018-01-12 MED ORDER — PRAVASTATIN SODIUM 20 MG PO TABS
20.0000 mg | ORAL_TABLET | Freq: Every day | ORAL | Status: DC
  Administered 2018-01-12 – 2018-01-15 (×4): 20 mg via ORAL
  Filled 2018-01-12 (×4): qty 1

## 2018-01-12 MED ORDER — INSULIN ASPART 100 UNIT/ML ~~LOC~~ SOLN: 0.0000 [IU] | Freq: Three times a day (TID) | SUBCUTANEOUS | Status: DC

## 2018-01-12 NOTE — Progress Notes (Signed)
2nd IV RN to reaccess PAC because of difficulty obtaining blood from port. Noted Right chest PAC cath kinked per today's chest x-ray. Reaccessed PAC,noted GBR,flushed with 20ccNS. Notified primary RN Lilia Pro not to use [facility] for now,PAC needs to repositioned in Radiology.Dr Marcie Bal paged for further instruction.

## 2018-01-12 NOTE — ED Triage Notes (Signed)
Pt BIB [facility] from [facility] for eval SOB progressing throughout the day. Pt received albuterol treatment around 1400 but stated felt worse. Pt arrives on CPAP by EMS, RA sat 86%. Pt w/ recent hx of PNA.

## 2018-01-12 NOTE — Progress Notes (Signed)
Hypoglycemic Event  CBG: 28 A&Ox4 despite lethargy  Treatment: 2 amps D50  Symptoms: Lethargy, profuse diaphoresis  Follow-up CBG: Time:15 min CBG Result:162  Possible Reasons for Event: NPO  Comments/MD notified: MD notified at 1115. Orders to check CBG q1h x4 and q4h afterwards. Will continue to monitor    Kinnie Scales  01/12/18 12:11 PM

## 2018-01-12 NOTE — ED Provider Notes (Signed)
[place] EMERGENCY DEPARTMENT Provider Note   CSN: [id number] Arrival date & time: 01/12/18  0040     History   Chief Complaint Chief Complaint  Patient presents with  . Shortness of Breath    HPI Mark [name]. is a 72 y.o. male.  Patient brought to the emergency department by ambulance from skilled nursing facility.  Patient has been experiencing increased difficulty breathing.  Patient was recently hospitalized for pneumonia.  He was noted to have a fever of 102 at the nursing home with cough and respiratory distress.  He is brought to the emergency department by ambulance on CPAP.  Distress has improved with the CPAP.  EMS report differently diminished breath sounds in the left lung field with moderate hypertension during transport.     Past Medical History:  Diagnosis Date  . Anemia   . Arthritis   . Bladder cancer ([place])   . Chronic kidney disease   . Diabetes mellitus without complication ([place])   . Dialysis patient [facility])    M,W,F  . Dyspnea    DOE  . GERD (gastroesophageal reflux disease)   . HOH (hard of hearing)   . Hypertension   . IBS (irritable bowel syndrome)   . Lymphoma ([place]) 09/27/2014  . Neuropathy    RIGHT LEG  . Stroke [facility])    TIA    Patient Active Problem List   Diagnosis Date Noted  . Acute respiratory failure with hypoxia ([place]) 01/12/2018  . Symptomatic anemia 12/31/2017  . Acute on chronic respiratory failure with hypoxemia ([place]) 12/31/2017  . Hypoxia 12/25/2017  . Pressure injury of skin 12/16/2017  . Fluid overload 01/15/2017  . UTI (urinary tract infection) 10/20/2016  . Sepsis ([place]) 10/20/2016  . 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 Ulcer of foot, chronic ([place]) 03/21/2015  . History of bladder cancer 11/10/2014  . Penile bleeding 10/12/2014  . Dependence on renal dialysis ([place]) 08/18/2013  . Arterial blood pressure  decreased 08/18/2013  . Leukemia ([place]) 08/18/2013  . Retina disorder 08/18/2013  . Breath shortness 08/18/2013  . End-stage renal disease on hemodialysis ([place]) 12/23/2012  . Absolute anemia 07/18/2012  . HPTH (hyperparathyroidism) ([place]) 07/18/2012  . Acidosis, metabolic 81/27/5170  . Hyperparathyroidism ([place]) 07/18/2012  . Abnormal presence of protein in urine 01/01/2012  . HLD (hyperlipidemia) 12/25/2011  . Diabetic retinopathy associated with type 2 diabetes mellitus ([place]) 10/26/1999  . Essential (primary) hypertension 04/04/1999  . Diabetes mellitus type 2, uncontrolled ([place]) 04/04/1999  . Type 2 diabetes mellitus with other diabetic kidney complication ([place]) 01/74/9449    Past Surgical History:  Procedure Laterality Date  . BACK SURGERY  2007  . CATARACT EXTRACTION W/PHACO Left 03/23/2016   Procedure: CATARACT EXTRACTION PHACO AND INTRAOCULAR LENS PLACEMENT (IOC);  Surgeon: Leandrew Koyanagi, MD;  Location: [facility] ORS;  Service: Ophthalmology;  Laterality: Left;  [place] 52.6 AP% 14.7 CDE 7.72 Fluid pack lot # [id number]  . CATARACT EXTRACTION W/PHACO Right 05/30/2016   Procedure: CATARACT EXTRACTION PHACO AND INTRAOCULAR LENS PLACEMENT ([place]);  Surgeon: Leandrew Koyanagi, MD;  Location: [facility] ORS;  Service: Ophthalmology;  Laterality: Right;  [place] 01:08 AP% 13.9 CDE 9.53  note: could not get IV in patient, so procedure done without anesthesia personell present, ok per Dr Wallace Going fluid pack lo t# [id number]  . CIRCUMCISION    . IVC FILTER INSERTION N/A 12/31/2017   Procedure: IVC FILTER INSERTION;  Surgeon: Leotis Pain  S, MD;  Location: [place] CV LAB;  Service: Cardiovascular;  Laterality: N/A;  . PERIPHERAL VASCULAR CATHETERIZATION Left 08/19/2015   Procedure: A/V Shuntogram/Fistulagram;  Surgeon: Algernon Huxley, MD;  Location: [place] CV LAB;  Service: Cardiovascular;  Laterality: Left;  . PERIPHERAL VASCULAR CATHETERIZATION N/A 08/19/2015   Procedure: A/V Shunt Intervention;   Surgeon: Algernon Huxley, MD;  Location: [place] CV LAB;  Service: Cardiovascular;  Laterality: N/A;  . PORTA CATH INSERTION N/A 01/02/2018   Procedure: PORTA CATH INSERTION;  Surgeon: Katha Cabal, MD;  Location: [place] CV LAB;  Service: Cardiovascular;  Laterality: N/A;        Home Medications    Prior to Admission medications   Medication Sig Start Date End Date Taking? Authorizing Provider  albuterol (PROVENTIL) (2.5 MG/3ML) 0.083% nebulizer solution Take 2.5 mg by nebulization every 6 (six) hours as needed for wheezing or shortness of breath.    [provider]  amoxicillin-clavulanate (AUGMENTIN) 500-125 MG tablet Take 1 tablet (500 mg total) by mouth daily for 5 days. 01/08/18 01/13/18  Dustin Flock, MD  budesonide-formoterol [facility]) 160-4.5 MCG/ACT inhaler Inhale 2 puffs into the lungs 2 (two) times daily. 12/27/17   Gladstone Lighter, MD  Difluprednate (DUREZOL) 0.05 % EMUL Place 1 drop into the left eye 2 (two) times daily.     [provider]  docusate sodium (COLACE) 100 MG capsule Take 100 mg by mouth daily as needed for mild constipation.    [provider]  ENSURE PLUS (ENSURE PLUS) LIQD Take 1 Can by mouth daily.  01/26/14   [provider]  fluticasone (FLONASE) 50 MCG/ACT nasal spray Place 1 spray into both nostrils daily as needed for allergies.  12/14/14   [provider]  gabapentin (NEURONTIN) 100 MG capsule Take 1 capsule (100 mg total) by mouth 3 (three) times daily. 03/02/16   Edrick Kins, DPM  glimepiride (AMARYL) 4 MG tablet Take 4 mg by mouth at bedtime. Prn glucose levels 10/06/14   [provider]  guaiFENesin (MUCINEX) 600 MG 12 hr tablet Take 1 tablet (600 mg total) by mouth 2 (two) times daily. 01/08/18   Dustin Flock, MD  guaiFENesin-dextromethorphan (ROBITUSSIN DM) 100-10 MG/5ML syrup Take 15 mLs by mouth every 4 (four) hours as needed for cough. 01/08/18   Dustin Flock, MD    insulin aspart (NOVOLOG) 100 UNIT/ML injection Inject 4 Units into the skin 3 (three) times daily with meals. 01/08/18   Dustin Flock, MD  insulin detemir (LEVEMIR) 100 UNIT/ML injection Inject 0.14 mLs (14 Units total) into the skin daily. 01/09/18   Dustin Flock, MD  ipratropium-albuterol (DUONEB) 0.5-2.5 (3) MG/3ML SOLN Take 3 mLs by nebulization 4 (four) times daily. 01/08/18   Dustin Flock, MD  LEVEMIR FLEXTOUCH 100 UNIT/ML Pen Inject 5-6 Units into the skin daily at 10 pm.  08/21/14   [provider]  lovastatin (MEVACOR) 20 MG tablet Take 1 tablet by mouth daily. 12/25/16   [provider]  metoCLOPramide (REGLAN) 5 MG tablet Take 5 mg by mouth 3 (three) times daily before meals. Patient takes 1 tab with each meal.    [provider]  midodrine (PROAMATINE) 10 MG tablet Take 1 tablet (10 mg total) by mouth every dialysis (MWF). 01/08/18   Dustin Flock, MD  multivitamin (RENA-VIT) TABS tablet Take 1 tablet by mouth at bedtime.     [provider]  nystatin cream (MYCOSTATIN) Apply 1 application topically 2 (two) times daily as  needed (irritation).     [provider]  Polyvinyl Alcohol-Povidone PF (REFRESH) 1.4-0.6 % SOLN Apply 1 drop to eye 3 (three) times daily as needed (Dry eyes).    [provider]  predniSONE (STERAPRED UNI-PAK 21 TAB) 10 MG (21) TBPK tablet Start at 60mg  taper by 10mg  until complete 01/08/18   Dustin Flock, MD  silver sulfADIAZINE (SILVADENE) 1 % cream APPLY 1 APPLICATION TOPICALY DAILY Patient taking differently: Apply 1 application topically daily as needed (sores).  04/12/17   Edrick Kins, DPM  sodium chloride (OCEAN) 0.65 % SOLN nasal spray Place 1 spray into both nostrils as needed for congestion. 01/08/18   Dustin Flock, MD  torsemide (DEMADEX) 100 MG tablet Take 1 tablet (100 mg total) by mouth daily. Please take on Non dialysis days- Tues, Thurs, Saturday and sunday 12/28/17   Gladstone Lighter, MD     Family History Family History  Problem Relation Age of Onset  . Cancer Mother   . Kidney cancer Neg Hx   . Prostate cancer Neg Hx   . Kidney failure Neg Hx   . Bladder Cancer Neg Hx     Social History Social History   Tobacco Use  . Smoking status: Former Smoker    Types: Cigarettes    Last attempt to quit: 05/19/2013    Years since quitting: 4.6  . Smokeless tobacco: Never Used  . Tobacco comment: quit  Substance Use Topics  . Alcohol use: No    Alcohol/week: 0.0 standard drinks  . Drug use: No     Allergies   Heparin; Ibuprofen; Multivitamin [centrum]; Daypro [oxaprozin]; and Tape   Review of Systems Review of Systems  Constitutional: Positive for chills and fever.  Respiratory: Positive for cough and shortness of breath.   Cardiovascular: Negative for chest pain.  All other systems reviewed and are negative.    Physical Exam Updated Vital Signs BP (!) 125/57 (BP Location: Right Arm)   Pulse 79   Temp 98.4 F (36.9 C) (Oral)   Resp (!) 24   Ht 5\' 11"  (1.803 m)   Wt 94.4 kg   SpO2 98%   BMI 29.03 kg/m   Physical Exam  Constitutional: He is oriented to person, place, and time. He appears well-developed and well-nourished. No distress.  HENT:  Head: Normocephalic and atraumatic.  Right Ear: Hearing normal.  Left Ear: Hearing normal.  Nose: Nose normal.  Mouth/Throat: Oropharynx is clear and moist and mucous membranes are normal.  Eyes: Pupils are equal, round, and reactive to light. Conjunctivae and EOM are normal.  Neck: Normal range of motion. Neck supple.  Cardiovascular: Regular rhythm, S1 normal and S2 normal. Tachycardia present. Exam reveals no gallop and no friction rub.  No murmur heard. Pulmonary/Chest: Effort normal. No respiratory distress. He has decreased breath sounds. He exhibits no tenderness.  Abdominal: Soft. Normal appearance and bowel sounds are normal. There is no hepatosplenomegaly. There is no tenderness. There is no  rebound, no guarding, no tenderness at McBurney's point and negative Murphy's sign. No hernia.  Musculoskeletal: Normal range of motion.  Neurological: He is alert and oriented to person, place, and time. He has normal strength. No cranial nerve deficit or sensory deficit. Coordination normal. GCS eye subscore is 4. GCS verbal subscore is 5. GCS motor subscore is 6.  Skin: Skin is warm, dry and intact. No rash noted. No cyanosis.  Psychiatric: He has a normal mood and affect. His speech is normal and behavior is normal. Thought  content normal.  Nursing note and vitals reviewed.    ED Treatments / Results  Labs (all labs ordered are listed, but only abnormal results are displayed) Labs Reviewed  COMPREHENSIVE METABOLIC PANEL - Abnormal; Notable for the following components:      Result Value   Glucose, Bld 170 (*)    BUN 42 (*)    Creatinine, Ser 4.28 (*)    Calcium 6.4 (*)    Total Protein 5.2 (*)    Albumin 2.5 (*)    GFR calc non Af Amer 13 (*)    GFR calc Af Amer 15 (*)    All other components within normal limits  CBC WITH DIFFERENTIAL/PLATELET - Abnormal; Notable for the following components:   WBC 3.4 (*)    RBC 2.54 (*)    Hemoglobin 7.5 (*)    HCT 24.4 (*)    RDW 16.9 (*)    Platelets 108 (*)    Lymphs Abs 0.3 (*)    All other components within normal limits  PROTIME-INR - Abnormal; Notable for the following components:   Prothrombin Time 17.1 (*)    All other components within normal limits  URINALYSIS, ROUTINE W REFLEX MICROSCOPIC - Abnormal; Notable for the following components:   APPearance CLOUDY (*)    Glucose, UA 50 (*)    Hgb urine dipstick MODERATE (*)    Protein, ur >=300 (*)    RBC / HPF >50 (*)    Bacteria, UA RARE (*)    All other components within normal limits  I-STAT ARTERIAL BLOOD GAS, ED - Abnormal; Notable for the following components:   pH, Arterial 7.452 (*)    pO2, Arterial 171.0 (*)    Bicarbonate 29.1 (*)    Acid-Base Excess 5.0 (*)     All other components within normal limits  CULTURE, BLOOD (ROUTINE X 2)  CULTURE, BLOOD (ROUTINE X 2)  URINE CULTURE  INFLUENZA PANEL BY PCR (TYPE A & B)  I-STAT CG4 LACTIC ACID, ED  I-STAT CG4 LACTIC ACID, ED    EKG None  Radiology Dg Chest Portable 1 View  Result Date: 01/12/2018 CLINICAL DATA:  72 year old male with shortness of breath. EXAM: PORTABLE CHEST 1 VIEW COMPARISON:  None. FINDINGS: Lung volumes are low. Diffuse peribronchial cuffing. Small to moderate right pleural effusion. Opacities in the lung bases bilaterally which may reflect areas of atelectasis and/or consolidation. No evidence of pulmonary edema. Heart size is normal. Upper mediastinal contours are within normal limits. Aortic atherosclerosis. Right internal jugular single-lumen porta cath with tip terminating in the right atrium. IMPRESSION: 1. Diffuse peribronchial cuffing concerning for an acute bronchitis. 2. Small to moderate right pleural effusion. 3. Low lung volumes with bibasilar opacities which may reflect areas of atelectasis and/or airspace consolidation. Electronically Signed   By: Vinnie Langton M.D.   On: 01/12/2018 01:48    Procedures Procedures (including critical care time)  Medications Ordered in ED Medications  vancomycin (VANCOCIN) IVPB 1000 mg/200 mL premix (has no administration in time range)  ceFEPIme (MAXIPIME) 1 g in sodium chloride 0.9 % 100 mL IVPB (has no administration in time range)  ceFEPIme (MAXIPIME) 2 g in sodium chloride 0.9 % 100 mL IVPB (0 g Intravenous Stopped 01/12/18 0215)  vancomycin (VANCOCIN) 2,000 mg in sodium chloride 0.9 % 500 mL IVPB (0 mg Intravenous Stopped 01/12/18 0459)  acetaminophen (TYLENOL) suppository 650 mg (650 mg Rectal Given 01/12/18 0120)     Initial Impression / Assessment and Plan / ED Course  I have reviewed the triage vital signs and the nursing notes.  Pertinent labs & imaging results that were available during my care of the patient were  reviewed by me and considered in my medical decision making (see chart for details).     Patient brought to the emergency department for evaluation of difficulty breathing.  Patient reports that he was recently treated for pneumonia, had an extended hospital stay.  Tonight at the nursing home he was noted to have a fever of 102 and was exhibiting respiratory distress.  Patient brought to the emergency department by EMS on CPAP.  He was found to be febrile to 104.  He was administered Tylenol, initiated on broad-spectrum antibiotic coverage for healthcare associated pneumonia.  Patient was not hypotensive, did not have a lactic acidosis.  No evidence of septic shock or severe sepsis.  Patient was kept on BiPAP here for a period of time and monitored.  Blood gas did not show any significant CO2 retention, was weaned off of BiPAP to nasal cannula oxygen has done well.  Will be hospitalized for further management of sepsis, pneumonia.  CRITICAL CARE Performed by: Orpah Greek   Total critical care time: 30 minutes  Critical care time was exclusive of separately billable procedures and treating other patients.  Critical care was necessary to treat or prevent imminent or life-threatening deterioration.  Critical care was time spent personally by me on the following activities: development of treatment plan with patient and/or surrogate as well as nursing, discussions with consultants, evaluation of patient's response to treatment, examination of patient, obtaining history from patient or surrogate, ordering and performing treatments and interventions, ordering and review of laboratory studies, ordering and review of radiographic studies, pulse oximetry and re-evaluation of patient's condition.   Final Clinical Impressions(s) / ED Diagnoses   Final diagnoses:  Sepsis with acute hypoxic respiratory failure without septic shock, due to unspecified organism [facility])    ED Discharge Orders      None       Orpah Greek, MD 01/12/18 [phone]

## 2018-01-12 NOTE — Consult Note (Addendum)
Renal Service Consult Note [facility]  Mark [name]. 01/12/2018 Mark [name] Requesting Physician: Dr Marthenia Rolling   Reason for Consult:  ESRD pt vol overload HPI: The patient is a 72 y.o. year-old w/ hx CVA, HTN, DM2, bladder cancer and neuropathy presenting w/ SOB from SNF to ED w/ O2 sat 86%, recent hx PNA. In ED CXR showed bibasilar infiltrates and vasc congestion.  Pt admitted and started on IV abx. We are called to see patient for vol overload and ESRD.    Pt gives his own hx, he was started on HD about 5 years ago, gets HD MWF schedule in [facility] w/ Dr Rolly Salter / [facility].  He notes sig LE edema and SOB w/ moving about the bed, some cough, not productive, no chills or fevers.  No recent AVF problems.  Has L forearm aVF created in May 2017 by Dr Lucky Cowboy.   Recent problems include admit in Sept 2019 for acute DVT lower extremity, started on Eliquis then due to worsening anemia in recent admit early October 2019 , had IVC filter placed and eliquis was dc'd.  Pt is married.    ROS  denies CP  no joint pain   no HA  no blurry vision  no rash  no diarrhea  no nausea/ vomiting   Past Medical History  Past Medical History:  Diagnosis Date  . Anemia   . Arthritis   . Bladder cancer ([place])   . Chronic kidney disease   . Diabetes mellitus without complication ([place])   . Dialysis patient [facility])    M,W,F  . Dyspnea    DOE  . GERD (gastroesophageal reflux disease)   . HOH (hard of hearing)   . Hypertension   . IBS (irritable bowel syndrome)   . Lymphoma ([place]) 09/27/2014  . Neuropathy    RIGHT LEG  . Stroke [facility])    TIA   Past Surgical History  Past Surgical History:  Procedure Laterality Date  . BACK SURGERY  2007  . CATARACT EXTRACTION W/PHACO Left 03/23/2016   Procedure: CATARACT EXTRACTION PHACO AND INTRAOCULAR LENS PLACEMENT (IOC);  Surgeon: Leandrew Koyanagi, MD;  Location: [facility] ORS;  Service: Ophthalmology;  Laterality: Left;  [place] 52.6 AP%  14.7 CDE 7.72 Fluid pack lot # [id number]  . CATARACT EXTRACTION W/PHACO Right 05/30/2016   Procedure: CATARACT EXTRACTION PHACO AND INTRAOCULAR LENS PLACEMENT ([place]);  Surgeon: Leandrew Koyanagi, MD;  Location: [facility] ORS;  Service: Ophthalmology;  Laterality: Right;  [place] 01:08 AP% 13.9 CDE 9.53  note: could not get IV in patient, so procedure done without anesthesia personell present, ok per Dr Wallace Going fluid pack lo t# [id number]  . CIRCUMCISION    . IVC FILTER INSERTION N/A 12/31/2017   Procedure: IVC FILTER INSERTION;  Surgeon: Algernon Huxley, MD;  Location: [place] CV LAB;  Service: Cardiovascular;  Laterality: N/A;  . PERIPHERAL VASCULAR CATHETERIZATION Left 08/19/2015   Procedure: A/V Shuntogram/Fistulagram;  Surgeon: Algernon Huxley, MD;  Location: [place] CV LAB;  Service: Cardiovascular;  Laterality: Left;  . PERIPHERAL VASCULAR CATHETERIZATION N/A 08/19/2015   Procedure: A/V Shunt Intervention;  Surgeon: Algernon Huxley, MD;  Location: [place] CV LAB;  Service: Cardiovascular;  Laterality: N/A;  . PORTA CATH INSERTION N/A 01/02/2018   Procedure: PORTA CATH INSERTION;  Surgeon: Katha Cabal, MD;  Location: [place] CV LAB;  Service: Cardiovascular;  Laterality: N/A;   Family History  Family History  Problem Relation Age of Onset  . Cancer  Mother   . Kidney cancer Neg Hx   . Prostate cancer Neg Hx   . Kidney failure Neg Hx   . Bladder Cancer Neg Hx    Social History  reports that he quit smoking about 4 years ago. His smoking use included cigarettes. He has never used smokeless tobacco. He reports that he does not drink alcohol or use drugs. Allergies  Allergies  Allergen Reactions  . Heparin Other (See Comments)    Excessive bleeding per pt.   . Ibuprofen Other (See Comments)    DUE TO DIALYSIS  . Multivitamin [Centrum] Other (See Comments)    DUE TO DIALYSIS  . Daypro [Oxaprozin] Swelling and Rash    Other reaction(s): Other (See Comments)  . Tape Rash    Home medications Prior to Admission medications   Medication Sig Start Date End Date Taking? Authorizing Provider  albuterol (PROVENTIL) (2.5 MG/3ML) 0.083% nebulizer solution Take 2.5 mg by nebulization every 6 (six) hours as needed for wheezing or shortness of breath.    [provider]  amoxicillin-clavulanate (AUGMENTIN) 500-125 MG tablet Take 1 tablet (500 mg total) by mouth daily for 5 days. 01/08/18 01/13/18  Dustin Flock, MD  budesonide-formoterol [facility]) 160-4.5 MCG/ACT inhaler Inhale 2 puffs into the lungs 2 (two) times daily. 12/27/17   Gladstone Lighter, MD  Difluprednate (DUREZOL) 0.05 % EMUL Place 1 drop into the left eye 2 (two) times daily.     [provider]  docusate sodium (COLACE) 100 MG capsule Take 100 mg by mouth daily as needed for mild constipation.    [provider]  ENSURE PLUS (ENSURE PLUS) LIQD Take 1 Can by mouth daily.  01/26/14   [provider]  fluticasone (FLONASE) 50 MCG/ACT nasal spray Place 1 spray into both nostrils daily as needed for allergies.  12/14/14   [provider]  gabapentin (NEURONTIN) 100 MG capsule Take 1 capsule (100 mg total) by mouth 3 (three) times daily. 03/02/16   Edrick Kins, DPM  glimepiride (AMARYL) 4 MG tablet Take 4 mg by mouth at bedtime. Prn glucose levels 10/06/14   [provider]  guaiFENesin (MUCINEX) 600 MG 12 hr tablet Take 1 tablet (600 mg total) by mouth 2 (two) times daily. 01/08/18   Dustin Flock, MD  guaiFENesin-dextromethorphan (ROBITUSSIN DM) 100-10 MG/5ML syrup Take 15 mLs by mouth every 4 (four) hours as needed for cough. 01/08/18   Dustin Flock, MD  insulin aspart (NOVOLOG) 100 UNIT/ML injection Inject 4 Units into the skin 3 (three) times daily with meals. 01/08/18   Dustin Flock, MD  insulin detemir (LEVEMIR) 100 UNIT/ML injection Inject 0.14 mLs (14 Units total) into the skin daily. 01/09/18   Dustin Flock, MD  ipratropium-albuterol (DUONEB)  0.5-2.5 (3) MG/3ML SOLN Take 3 mLs by nebulization 4 (four) times daily. 01/08/18   Dustin Flock, MD  LEVEMIR FLEXTOUCH 100 UNIT/ML Pen Inject 5-6 Units into the skin daily at 10 pm.  08/21/14   [provider]  lovastatin (MEVACOR) 20 MG tablet Take 1 tablet by mouth daily. 12/25/16   [provider]  metoCLOPramide (REGLAN) 5 MG tablet Take 5 mg by mouth 3 (three) times daily before meals. Patient takes 1 tab with each meal.    [provider]  midodrine (PROAMATINE) 10 MG tablet Take 1 tablet (10 mg total) by mouth every dialysis (MWF). 01/08/18   Dustin Flock, MD  multivitamin (RENA-VIT) TABS tablet Take 1 tablet by mouth at bedtime.     [provider]  nystatin cream (MYCOSTATIN) Apply 1 application topically 2 (two) times daily as needed (irritation).     [provider]  Polyvinyl Alcohol-Povidone PF (REFRESH) 1.4-0.6 % SOLN Apply 1 drop to eye 3 (three) times daily as needed (Dry eyes).    [provider]  predniSONE (STERAPRED UNI-PAK 21 TAB) 10 MG (21) TBPK tablet Start at 60mg  taper by 10mg  until complete 01/08/18   Dustin Flock, MD  silver sulfADIAZINE (SILVADENE) 1 % cream APPLY 1 APPLICATION TOPICALY DAILY Patient taking differently: Apply 1 application topically daily as needed (sores).  04/12/17   Edrick Kins, DPM  sodium chloride (OCEAN) 0.65 % SOLN nasal spray Place 1 spray into both nostrils as needed for congestion. 01/08/18   Dustin Flock, MD  torsemide (DEMADEX) 100 MG tablet Take 1 tablet (100 mg total) by mouth daily. Please take on Non dialysis days- Tues, Thurs, Saturday and sunday 12/28/17   Gladstone Lighter, MD   Liver Function Tests Recent Labs  Lab 01/07/18 0421 01/12/18 0122  AST  --  19  ALT  --  20  ALKPHOS  --  56  BILITOT  --  1.0  PROT  --  5.2*  ALBUMIN 2.8* 2.5*   No results for input(s): LIPASE, AMYLASE in the last 168 hours. CBC Recent Labs  Lab 01/07/18 0421 01/08/18 0605  01/12/18 0122  WBC 5.8 4.9 3.4*  NEUTROABS  --   --  3.0  HGB 7.8* 8.4* 7.5*  HCT 23.5* 25.4* 24.4*  MCV 90.6 92.4 96.1  PLT 76* 81* 751*   Basic Metabolic Panel Recent Labs  Lab 01/06/18 0506 01/07/18 0421 01/12/18 0122  NA 135 133* 137  K 4.6 5.7* 4.4  CL 94* 92* 98  CO2 27 25 26   GLUCOSE 255* 194* 170*  BUN 86* 103* 42*  CREATININE 6.44* 7.52* 4.28*  CALCIUM 6.5* 6.0* 6.4*  PHOS  --  6.3* 3.9   Iron/TIBC/Ferritin/ %Sat    Component Value Date/Time   IRON 29 (L) 01/25/2017 0844   IRON 78 01/31/2013 1134   TIBC 213 (L) 01/25/2017 0844   TIBC 265 01/31/2013 1134   FERRITIN 601 (H) 07-13-2016 1123   IRONPCTSAT 14 (L) 01/25/2017 0844   IRONPCTSAT 29 01/31/2013 1134    Vitals:   01/12/18 0445 01/12/18 0500 01/12/18 0515 01/12/18 0549  BP: (!) 134/47 (!) 124/59 (!) 114/52 (!) 125/57  Pulse: 84 85 82 79  Resp: 20 20 16  (!) 24  Temp:    98.4 F (36.9 C)  TempSrc:    Oral  SpO2: 96% 96% 100% 98%  Weight:    94.4 kg  Height:    5\' 11"  (1.803 m)   Exam Gen nasal O2, ^wob w/ talking, no distress No rash, cyanosis or gangrene Sclera anicteric, throat clear  +jvd to angle of the jaw, no bruits Chest rales R base, L clear RRR no MRG Abd soft ntnd no mass or ascites +bs GU normal male MS no joint effusions or deformity Ext 2+ pitting bilat LE edema, +1-2 UE edema Neuro is alert, Ox 3, gen'd weakness, bedbound   CXR 10/12 am > bilat LL densities R > L atx vs consolidation, no gross edema   CT chest 10/6 - diffuse ground glass bilat, RLL consolidation/ air bronchograms   Home meds:  - glimepiride 4 hs/ insulin aspart 4u tid/ insulin detemir 14 qd  - midodrine 10 mg prehd mwf  - albuterol prn/ budesonide-formoterol 2 bid/ ipratropium-albuterol qid nebs  -  lovastatin 20 qd/ gabapentin 100 tid  - prns/ vitamins/ supplements/ creams   Dialysis: [facility] MWF [facility]  L AVF 90.5kg  Impression/ Plan: 1. SOB/ vol overload - sig vol overload, edema by recent  CT chest w/ sig LE edema.  Plan extra HD / serial HD for this.  HD tonight, max UF as tolerated 2. PNA - HCAP/ RLL, on IV vanc/ cefepime  3. ESRD on HD - [facility] in [place] MWF sched.  4. DM on insulin 5. Recent DVT LE - sp IVC filter, eliquis dc'd due to worsening anemia 6. H/o CVA 7. H/o bladder cancer 8. Debility - prob multifactorial, from SNF   Kelly Splinter MD [place] pager [phone]   01/12/2018, 11:43 AM

## 2018-01-12 NOTE — Progress Notes (Signed)
Pharmacy Antibiotic Note  Mark [name]. is a 71 y.o. male admitted on 01/12/2018 with pneumonia.  Pharmacy has been consulted for Vancomycin/Cefepime dosing. WBC 3.4. ESRD on HD MWF.   Plan: Vancomycin 2000 mg IV x 1, then 1000 mg IV qHD MWF Cefepime 1g IV q24h Trend WBC, temp, HD schedule F/U infectious work-up Drug levels as indicated   Height: 5\' 11"  (180.3 cm) Weight: 211 lb 10.3 oz (96 kg) IBW/kg (Calculated) : 75.3  Temp (24hrs), Avg:103 F (39.4 C), Min:101.4 F (38.6 C), Max:104.6 F (40.3 C)  Recent Labs  Lab 01/05/18 0701 01/06/18 0506 01/06/18 1407 01/07/18 0421 01/08/18 0605 01/12/18 0122 01/12/18 0125  WBC 4.9 6.8 6.8 5.8 4.9 3.4*  --   CREATININE 5.09* 6.44*  --  7.52*  --  4.28*  --   LATICACIDVEN  --   --   --   --   --   --  1.66    Estimated Creatinine Clearance: 18.4 mL/min (A) (by C-G formula based on SCr of 4.28 mg/dL (H)).    Allergies  Allergen Reactions  . Heparin Nausea Only and Other (See Comments)    Excessive bleeding per pt.   . Ibuprofen Other (See Comments)    DUE TO DIALYSIS  . Multivitamin [Centrum] Other (See Comments)    DUE TO DIALYSIS  . Daypro [Oxaprozin] Swelling and Rash    Other reaction(s): Other (See Comments)  . Tape Rash     [name] 01/12/2018 5:45 AM

## 2018-01-12 NOTE — H&P (Signed)
History and Physical  Maddock J [employer]. VHQ:[id number] DOB: [birth year] DOA: 01/12/2018  Referring physician: ER physician PCP: Albina Billet, MD  Outpatient Specialists: Nephrology Patient coming from: Skilled nursing facility  Chief Complaint: Respiratory distress and fever since last night  HPI: Patient is a 72 year old male, skilled nursing facility resident, with past medical history significant for recent pneumonia, ESRD on hemodialysis on Monday, Wednesday and Friday; CVA, lymphoma, diabetes mellitus, hypertension, bladder cancer, anemia and irritable bowel syndrome.  Patient was recently discharged from: [facility] on 01/08/2018 after inpatient care for acute pulmonary edema, elevated troponin, altered mental status, pneumonia and acute on chronic respiratory failure with hypoxemia.  Patient present from the skilled nursing facility with fever and chills that was reported to have started overnight.  Pressure of 104.6 F was reported.  Patient was in significant respiratory distress on presentation, necessitating BiPAP treatment.  Leukopenia is noted.  Chest x-ray done on presentation revealed "Diffuse peribronchial cuffing concerning for an acute bronchitis.  Small to moderate right pleural effusion.  Low lung volumes with bibasilar opacities which may reflect areas of atelectasis and/or airspace consolidation".  On presentation to the ER, sepsis protocol was activated.  Patient was administered IV fluids as per sepsis protocol, and started on IV vancomycin and cefepime.  Patient has been able to, the BiPAP.  Patient could not or would not give any significant history.  Patient is noted to be sweaty.  Blood sugar done later was 28.  No headache, no neck pain, no chest pain, no sore throat, no GI symptoms and no urinary symptoms.  ED Course: Please see above.  Pertinent labs:   EKG: Independently reviewed.   Imaging: independently reviewed.   Review of Systems:  Patient is  a poor historian.  Negative for sore throat, rash, new muscle aches, chest pain, SOB, dysuria, bleeding, n/v/abdominal pain.  Past Medical History:  Diagnosis Date  . Anemia   . Arthritis   . Bladder cancer ([place])   . Chronic kidney disease   . Diabetes mellitus without complication ([place])   . Dialysis patient [facility])    M,W,F  . Dyspnea    DOE  . GERD (gastroesophageal reflux disease)   . HOH (hard of hearing)   . Hypertension   . IBS (irritable bowel syndrome)   . Lymphoma ([place]) 09/27/2014  . Neuropathy    RIGHT LEG  . Stroke [facility])    TIA    Past Surgical History:  Procedure Laterality Date  . BACK SURGERY  2007  . CATARACT EXTRACTION W/PHACO Left 03/23/2016   Procedure: CATARACT EXTRACTION PHACO AND INTRAOCULAR LENS PLACEMENT (IOC);  Surgeon: Leandrew Koyanagi, MD;  Location: [facility] ORS;  Service: Ophthalmology;  Laterality: Left;  [place] 52.6 AP% 14.7 CDE 7.72 Fluid pack lot # [id number]  . CATARACT EXTRACTION W/PHACO Right 05/30/2016   Procedure: CATARACT EXTRACTION PHACO AND INTRAOCULAR LENS PLACEMENT ([place]);  Surgeon: Leandrew Koyanagi, MD;  Location: [facility] ORS;  Service: Ophthalmology;  Laterality: Right;  [place] 01:08 AP% 13.9 CDE 9.53  note: could not get IV in patient, so procedure done without anesthesia personell present, ok per Dr Wallace Going fluid pack lo t# [id number]  . CIRCUMCISION    . IVC FILTER INSERTION N/A 12/31/2017   Procedure: IVC FILTER INSERTION;  Surgeon: Algernon Huxley, MD;  Location: [place] CV LAB;  Service: Cardiovascular;  Laterality: N/A;  . PERIPHERAL VASCULAR CATHETERIZATION Left 08/19/2015   Procedure: A/V Shuntogram/Fistulagram;  Surgeon: Algernon Huxley, MD;  Location: [place]  CV LAB;  Service: Cardiovascular;  Laterality: Left;  . PERIPHERAL VASCULAR CATHETERIZATION N/A 08/19/2015   Procedure: A/V Shunt Intervention;  Surgeon: Algernon Huxley, MD;  Location: [place] CV LAB;  Service: Cardiovascular;  Laterality: N/A;  . PORTA CATH INSERTION N/A  01/02/2018   Procedure: PORTA CATH INSERTION;  Surgeon: Katha Cabal, MD;  Location: [place] CV LAB;  Service: Cardiovascular;  Laterality: N/A;     reports that he quit smoking about 4 years ago. His smoking use included cigarettes. He has never used smokeless tobacco. He reports that he does not drink alcohol or use drugs.  Allergies  Allergen Reactions  . Heparin Nausea Only and Other (See Comments)    Excessive bleeding per pt.   . Ibuprofen Other (See Comments)    DUE TO DIALYSIS  . Multivitamin [Centrum] Other (See Comments)    DUE TO DIALYSIS  . Daypro [Oxaprozin] Swelling and Rash    Other reaction(s): Other (See Comments)  . Tape Rash    Family History  Problem Relation Age of Onset  . Cancer Mother   . Kidney cancer Neg Hx   . Prostate cancer Neg Hx   . Kidney failure Neg Hx   . Bladder Cancer Neg Hx      Prior to Admission medications   Medication Sig Start Date End Date Taking? Authorizing Provider  albuterol (PROVENTIL) (2.5 MG/3ML) 0.083% nebulizer solution Take 2.5 mg by nebulization every 6 (six) hours as needed for wheezing or shortness of breath.    [provider]  amoxicillin-clavulanate (AUGMENTIN) 500-125 MG tablet Take 1 tablet (500 mg total) by mouth daily for 5 days. 01/08/18 01/13/18  Dustin Flock, MD  budesonide-formoterol [facility]) 160-4.5 MCG/ACT inhaler Inhale 2 puffs into the lungs 2 (two) times daily. 12/27/17   Gladstone Lighter, MD  Difluprednate (DUREZOL) 0.05 % EMUL Place 1 drop into the left eye 2 (two) times daily.     [provider]  docusate sodium (COLACE) 100 MG capsule Take 100 mg by mouth daily as needed for mild constipation.    [provider]  ENSURE PLUS (ENSURE PLUS) LIQD Take 1 Can by mouth daily.  01/26/14   [provider]  fluticasone (FLONASE) 50 MCG/ACT nasal spray Place 1 spray into both nostrils daily as needed for allergies.  12/14/14   [provider]    gabapentin (NEURONTIN) 100 MG capsule Take 1 capsule (100 mg total) by mouth 3 (three) times daily. 03/02/16   Edrick Kins, DPM  glimepiride (AMARYL) 4 MG tablet Take 4 mg by mouth at bedtime. Prn glucose levels 10/06/14   [provider]  guaiFENesin (MUCINEX) 600 MG 12 hr tablet Take 1 tablet (600 mg total) by mouth 2 (two) times daily. 01/08/18   Dustin Flock, MD  guaiFENesin-dextromethorphan (ROBITUSSIN DM) 100-10 MG/5ML syrup Take 15 mLs by mouth every 4 (four) hours as needed for cough. 01/08/18   Dustin Flock, MD  insulin aspart (NOVOLOG) 100 UNIT/ML injection Inject 4 Units into the skin 3 (three) times daily with meals. 01/08/18   Dustin Flock, MD  insulin detemir (LEVEMIR) 100 UNIT/ML injection Inject 0.14 mLs (14 Units total) into the skin daily. 01/09/18   Dustin Flock, MD  ipratropium-albuterol (DUONEB) 0.5-2.5 (3) MG/3ML SOLN Take 3 mLs by nebulization 4 (four) times daily. 01/08/18   Dustin Flock, MD  LEVEMIR FLEXTOUCH 100 UNIT/ML Pen Inject 5-6 Units into the skin daily at 10 pm.  08/21/14   [provider]  lovastatin (  MEVACOR) 20 MG tablet Take 1 tablet by mouth daily. 12/25/16   [provider]  metoCLOPramide (REGLAN) 5 MG tablet Take 5 mg by mouth 3 (three) times daily before meals. Patient takes 1 tab with each meal.    [provider]  midodrine (PROAMATINE) 10 MG tablet Take 1 tablet (10 mg total) by mouth every dialysis (MWF). 01/08/18   Dustin Flock, MD  multivitamin (RENA-VIT) TABS tablet Take 1 tablet by mouth at bedtime.     [provider]  nystatin cream (MYCOSTATIN) Apply 1 application topically 2 (two) times daily as needed (irritation).     [provider]  Polyvinyl Alcohol-Povidone PF (REFRESH) 1.4-0.6 % SOLN Apply 1 drop to eye 3 (three) times daily as needed (Dry eyes).    [provider]  predniSONE (STERAPRED UNI-PAK 21 TAB) 10 MG (21) TBPK tablet Start at 60mg  taper by 10mg  until  complete 01/08/18   Dustin Flock, MD  silver sulfADIAZINE (SILVADENE) 1 % cream APPLY 1 APPLICATION TOPICALY DAILY Patient taking differently: Apply 1 application topically daily as needed (sores).  04/12/17   Edrick Kins, DPM  sodium chloride (OCEAN) 0.65 % SOLN nasal spray Place 1 spray into both nostrils as needed for congestion. 01/08/18   Dustin Flock, MD  torsemide (DEMADEX) 100 MG tablet Take 1 tablet (100 mg total) by mouth daily. Please take on Non dialysis days- Tues, Thurs, Saturday and sunday 12/28/17   Gladstone Lighter, MD    Physical Exam: Vitals:   01/12/18 0445 01/12/18 0500 01/12/18 0515 01/12/18 0549  BP: (!) 134/47 (!) 124/59 (!) 114/52 (!) 125/57  Pulse: 84 85 82 79  Resp: 20 20 16  (!) 24  Temp:    98.4 F (36.9 C)  TempSrc:    Oral  SpO2: 96% 96% 100% 98%  Weight:    94.4 kg  Height:    5\' 11"  (1.803 m)    Constitutional:  . Appears calm and comfortable.  Bed is soaked with sweat.   Eyes:  . Pallor. No jaundice.  Impaired vision left eye  ENMT:  . external ears, nose appear normal Neck:  . Neck is supple. No JVD Respiratory:  . Decreased air entry at right lung base posteriorly.   Cardiovascular:  . S1S2 . Bilateral lower leg edema.   Abdomen:  . Abdomen is soft and non tender. Organs are difficult to assess. Neurologic:  . Awake and alert. . Moves all limbs.  Wt Readings from Last 3 Encounters:  01/12/18 94.4 kg  01/07/18 95.9 kg  12/27/17 86.5 kg    I have personally reviewed following labs and imaging studies  Labs on Admission:  CBC: Recent Labs  Lab 01/06/18 0506 01/06/18 1407 01/06/18 2216 01/07/18 0421 01/08/18 0605 01/12/18 0122  WBC 6.8 6.8  --  5.8 4.9 3.4*  NEUTROABS  --   --   --   --   --  3.0  HGB 7.2* 7.7* 7.9* 7.8* 8.4* 7.5*  HCT 21.4* 22.8*  --  23.5* 25.4* 24.4*  MCV 91.3 92.5  --  90.6 92.4 96.1  PLT 70* 77*  --  76* 81* 509*   Basic Metabolic Panel: Recent Labs  Lab 01/06/18 0506 01/07/18 0421  01/12/18 0122  NA 135 133* 137  K 4.6 5.7* 4.4  CL 94* 92* 98  CO2 27 25 26   GLUCOSE 255* 194* 170*  BUN 86* 103* 42*  CREATININE 6.44* 7.52* 4.28*  CALCIUM 6.5* 6.0* 6.4*  PHOS  --  6.3*  --    Liver Function Tests: Recent Labs  Lab 01/07/18 0421 01/12/18 0122  AST  --  19  ALT  --  20  ALKPHOS  --  56  BILITOT  --  1.0  PROT  --  5.2*  ALBUMIN 2.8* 2.5*   No results for input(s): LIPASE, AMYLASE in the last 168 hours. No results for input(s): AMMONIA in the last 168 hours. Coagulation Profile: Recent Labs  Lab 01/12/18 0122  INR 1.41   Cardiac Enzymes: No results for input(s): CKTOTAL, CKMB, CKMBINDEX, TROPONINI in the last 168 hours. BNP (last 3 results) No results for input(s): PROBNP in the last 8760 hours. HbA1C: No results for input(s): HGBA1C in the last 72 hours. CBG: Recent Labs  Lab 01/07/18 1637 01/07/18 2107 01/08/18 0727 01/08/18 1237 01/08/18 1650  GLUCAP 160* 302* 363* 472* 338*   Lipid Profile: No results for input(s): CHOL, HDL, LDLCALC, TRIG, CHOLHDL, LDLDIRECT in the last 72 hours. Thyroid Function Tests: No results for input(s): TSH, T4TOTAL, FREET4, T3FREE, THYROIDAB in the last 72 hours. Anemia Panel: No results for input(s): VITAMINB12, FOLATE, FERRITIN, TIBC, IRON, RETICCTPCT in the last 72 hours. Urine analysis:    Component Value Date/Time   COLORURINE YELLOW 01/12/2018 0129   APPEARANCEUR CLOUDY (A) 01/12/2018 0129   APPEARANCEUR Clear 01/06/2016 1433   LABSPEC 1.014 01/12/2018 0129   LABSPEC 1.014 10/20/2013 1835   PHURINE 5.0 01/12/2018 0129   GLUCOSEU 50 (A) 01/12/2018 0129   GLUCOSEU 150 mg/dL 10/20/2013 1835   HGBUR MODERATE (A) 01/12/2018 0129   BILIRUBINUR NEGATIVE 01/12/2018 0129   BILIRUBINUR Negative 01/06/2016 1433   BILIRUBINUR Negative 10/20/2013 1835   KETONESUR NEGATIVE 01/12/2018 0129   PROTEINUR >=300 (A) 01/12/2018 0129   NITRITE NEGATIVE 01/12/2018 0129   LEUKOCYTESUR NEGATIVE 01/12/2018 0129    LEUKOCYTESUR Negative 01/06/2016 1433   LEUKOCYTESUR 1+ 10/20/2013 1835   Sepsis Labs: @LABRCNTIP (procalcitonin:4,lacticidven:4) )No results found for this or any previous visit (from the past 240 hour(s)).    Radiological Exams on Admission: Dg Chest Portable 1 View  Result Date: 01/12/2018 CLINICAL DATA:  72 year old male with shortness of breath. EXAM: PORTABLE CHEST 1 VIEW COMPARISON:  None. FINDINGS: Lung volumes are low. Diffuse peribronchial cuffing. Small to moderate right pleural effusion. Opacities in the lung bases bilaterally which may reflect areas of atelectasis and/or consolidation. No evidence of pulmonary edema. Heart size is normal. Upper mediastinal contours are within normal limits. Aortic atherosclerosis. Right internal jugular single-lumen porta cath with tip terminating in the right atrium. IMPRESSION: 1. Diffuse peribronchial cuffing concerning for an acute bronchitis. 2. Small to moderate right pleural effusion. 3. Low lung volumes with bibasilar opacities which may reflect areas of atelectasis and/or airspace consolidation. Electronically Signed   By: Vinnie Langton M.D.   On: 01/12/2018 01:48    EKG: Independently reviewed.   Active Problems:   Acute respiratory failure with hypoxia (HCC)   Assessment/Plan Acute respiratory failure: Continue steroids Continue neb bronchodilator treatment. Follow cultures. IV antibiotics. Titrate oxygen supplement to keep O2 sat greater than 91%. Treat underlying process. Further management depend on hospital course.  Possible viral syndrome/pneumonia: PCR for influenza A and B. Tamiflu and to the results of the influenza is known. IV vancomycin  Possible HCAP: Recent inpatient stay at [facility], [place]. Follow sputum culture IV Vanco and IV cefepime. Further management will depend on hospital course.  Hypoglycemia: Hold off supplemental insulin. Accu-Cheks q. for 1 hour. Await the results of work-up  done so far.  ESRD on hemodialysis on Monday, Wednesday and Friday: Last hemodialysis was yesterday. Patient seems volume overloaded. Chest x-ray also revealed right-sided pleural effusion. Nephrology team consulted, discussed with Dr. Roney Jaffe.  Volume overload: Kindly see above.  Pleural effusion, right-sided: Worsened from previous chest x-ray done on 01/05/2018. Continue to monitor closely Low threshold to proceed with thoracentesis and analysis, considering recent pneumonia to rule out abscess/empyema.  History of CVA: Stable.  Hypertension: Patient is on midodrine Continue to monitor and optimize.  Diabetes mellitus: Currently hypoglycemic. Continue to assess and manage expectantly.  Anemia of CKD: [place] to the nephrology group.  Query encephalopathy: Baseline not known to me. Likely multifactorial Continue to assess.  Guarded prognosis.  DVT prophylaxis: SCD Code Status: Full code Family Communication:  Disposition Plan: Back to skilled nursing facility eventually Consults called: Nephrology Admission status: Inpatient  Patient medical condition is such that inpatient stay is warranted.  Patient is acutely ill, and will likely deteriorate if not managed on an inpatient basis.  Time spent: 65 minutes minutes.  Dana Allan, MD  [facility] Pager #: [phone] 7PM-7AM contact night coverage as above  01/12/2018, 8:53 AM

## 2018-01-12 NOTE — Progress Notes (Signed)
Pt taken off of BIPAP, placed on 4L [place] (per home reg). Pt states he feels comfortable at this time. Advised family to notify for RT if pt feels like he needs to go back on BIPAP. RT Will continue to monitor.

## 2018-01-13 LAB — RESPIRATORY PANEL BY PCR
ADENOVIRUS-RVPPCR: NOT DETECTED
BORDETELLA PERTUSSIS-RVPCR: NOT DETECTED
CORONAVIRUS HKU1-RVPPCR: NOT DETECTED
CORONAVIRUS NL63-RVPPCR: NOT DETECTED
Chlamydophila pneumoniae: NOT DETECTED
Coronavirus 229E: NOT DETECTED
Coronavirus OC43: NOT DETECTED
Influenza A H1 2009: NOT DETECTED
Influenza A H1: NOT DETECTED
Influenza A H3: NOT DETECTED
Influenza A: NOT DETECTED
Influenza B: NOT DETECTED
METAPNEUMOVIRUS-RVPPCR: NOT DETECTED
MYCOPLASMA PNEUMONIAE-RVPPCR: NOT DETECTED
PARAINFLUENZA VIRUS 1-RVPPCR: NOT DETECTED
PARAINFLUENZA VIRUS 4-RVPPCR: NOT DETECTED
Parainfluenza Virus 2: NOT DETECTED
Parainfluenza Virus 3: NOT DETECTED
RESPIRATORY SYNCYTIAL VIRUS-RVPPCR: NOT DETECTED
Rhinovirus / Enterovirus: DETECTED — AB

## 2018-01-13 LAB — CBC
HCT: 22.6 % — ABNORMAL LOW (ref 39.0–52.0)
Hemoglobin: 7 g/dL — ABNORMAL LOW (ref 13.0–17.0)
MCH: 29.9 pg (ref 26.0–34.0)
MCHC: 31 g/dL (ref 30.0–36.0)
MCV: 96.6 fL (ref 80.0–100.0)
Platelets: 86 10*3/uL — ABNORMAL LOW (ref 150–400)
RBC: 2.34 MIL/uL — ABNORMAL LOW (ref 4.22–5.81)
RDW: 16.1 % — ABNORMAL HIGH (ref 11.5–15.5)
WBC: 2.4 10*3/uL — ABNORMAL LOW (ref 4.0–10.5)
nRBC: 0 % (ref 0.0–0.2)

## 2018-01-13 LAB — GLUCOSE, CAPILLARY
GLUCOSE-CAPILLARY: 239 mg/dL — AB (ref 70–99)
GLUCOSE-CAPILLARY: 319 mg/dL — AB (ref 70–99)
GLUCOSE-CAPILLARY: 322 mg/dL — AB (ref 70–99)
GLUCOSE-CAPILLARY: 332 mg/dL — AB (ref 70–99)
GLUCOSE-CAPILLARY: 356 mg/dL — AB (ref 70–99)
GLUCOSE-CAPILLARY: 431 mg/dL — AB (ref 70–99)

## 2018-01-13 LAB — BASIC METABOLIC PANEL
Anion gap: 16 — ABNORMAL HIGH (ref 5–15)
BUN: 30 mg/dL — ABNORMAL HIGH (ref 8–23)
CO2: 24 mmol/L (ref 22–32)
Calcium: 6.8 mg/dL — ABNORMAL LOW (ref 8.9–10.3)
Chloride: 97 mmol/L — ABNORMAL LOW (ref 98–111)
Creatinine, Ser: 3.26 mg/dL — ABNORMAL HIGH (ref 0.61–1.24)
GFR calc Af Amer: 20 mL/min — ABNORMAL LOW (ref >60)
GFR calc non Af Amer: 18 mL/min — ABNORMAL LOW (ref >60)
Glucose, Bld: 371 mg/dL — ABNORMAL HIGH (ref 70–99)
Potassium: 4.7 mmol/L (ref 3.5–5.1)
Sodium: 137 mmol/L (ref 135–145)

## 2018-01-13 LAB — GLUCOSE, RANDOM: Glucose, Bld: 404 mg/dL — ABNORMAL HIGH (ref 70–99)

## 2018-01-13 LAB — URINE CULTURE: CULTURE: NO GROWTH

## 2018-01-13 MED ORDER — POLYETHYLENE GLYCOL 3350 17 G PO PACK
17.0000 g | PACK | Freq: Every day | ORAL | Status: DC
  Administered 2018-01-15: 17 g via ORAL
  Filled 2018-01-13 (×3): qty 1

## 2018-01-13 MED ORDER — INSULIN ASPART 100 UNIT/ML ~~LOC~~ SOLN
15.0000 [IU] | Freq: Once | SUBCUTANEOUS | Status: AC
  Administered 2018-01-13: 15 [IU] via SUBCUTANEOUS

## 2018-01-13 MED ORDER — INSULIN ASPART 100 UNIT/ML ~~LOC~~ SOLN
0.0000 [IU] | Freq: Three times a day (TID) | SUBCUTANEOUS | Status: DC
  Administered 2018-01-14: 3 [IU] via SUBCUTANEOUS
  Administered 2018-01-15: 5 [IU] via SUBCUTANEOUS

## 2018-01-13 MED ORDER — CHLORHEXIDINE GLUCONATE CLOTH 2 % EX PADS
6.0000 | MEDICATED_PAD | Freq: Every day | CUTANEOUS | Status: DC
  Administered 2018-01-14: 6 via TOPICAL

## 2018-01-13 MED ORDER — INSULIN ASPART 100 UNIT/ML ~~LOC~~ SOLN: 20.0000 [IU] | Freq: Once | SUBCUTANEOUS | Status: DC

## 2018-01-13 MED ORDER — SODIUM CHLORIDE 0.9 % IV SOLN
2.0000 g | INTRAVENOUS | Status: DC
  Filled 2018-01-13: qty 2

## 2018-01-13 MED ORDER — SENNOSIDES-DOCUSATE SODIUM 8.6-50 MG PO TABS
1.0000 | ORAL_TABLET | Freq: Two times a day (BID) | ORAL | Status: DC
  Administered 2018-01-13 – 2018-01-15 (×4): 1 via ORAL
  Filled 2018-01-13 (×5): qty 1

## 2018-01-13 MED ORDER — INSULIN ASPART 100 UNIT/ML ~~LOC~~ SOLN
0.0000 [IU] | Freq: Every day | SUBCUTANEOUS | Status: DC
  Administered 2018-01-13: 2 [IU] via SUBCUTANEOUS
  Administered 2018-01-13: 5 [IU] via SUBCUTANEOUS

## 2018-01-13 NOTE — Progress Notes (Signed)
[name] Progress Note  Subjective: no new c/o, still SOB at rest  Vitals:   01/13/18 0435 01/13/18 0735 01/13/18 1100 01/13/18 1312  BP: (!) 141/71 (!) 131/53 (!) 157/66   Pulse: 81 68 95   Resp: 15 18 16    Temp: 97.8 F (36.6 C) 97.6 F (36.4 C) 97.9 F (36.6 C)   TempSrc: Oral Oral Oral   SpO2: 98% 97% 93% 100%  Weight:      Height:        Inpatient medications: . Chlorhexidine Gluconate Cloth  6 each Topical Q0600  . feeding supplement (ENSURE ENLIVE)  237 mL Oral Daily  . gabapentin  100 mg Oral TID  . metoCLOPramide  5 mg Oral TID AC  . [START ON 01/14/2018] midodrine  10 mg Oral Q M,W,F-HD  . mometasone-formoterol  2 puff Inhalation BID  . multivitamin  1 tablet Oral QHS  . polyethylene glycol  17 g Oral Daily  . pravastatin  20 mg Oral q1800  . prednisoLONE acetate  1 drop Left Eye BID  . [START ON 01/14/2018] predniSONE  20 mg Oral Q breakfast   And  . [START ON 01/15/2018] predniSONE  10 mg Oral Q breakfast  . senna-docusate  1 tablet Oral BID  . torsemide  100 mg Oral Once per day on Sun Tue Thu Sat   . [START ON 01/14/2018] ceFEPime (MAXIPIME) IV    . [START ON 01/14/2018] vancomycin     docusate sodium, fluticasone, ipratropium-albuterol, polyvinyl alcohol, sodium chloride, sodium chloride flush  Iron/TIBC/Ferritin/ %Sat    Component Value Date/Time   IRON 29 (L) 01/25/2017 0844   IRON 78 01/31/2013 1134   TIBC 213 (L) 01/25/2017 0844   TIBC 265 01/31/2013 1134   FERRITIN 601 (H) 2020/11/3116 1123   IRONPCTSAT 14 (L) 01/25/2017 0844   IRONPCTSAT 29 01/31/2013 1134    Exam: Gen nasal O2, ^wob  No rash, cyanosis or gangrene Sclera anicteric, throat clear  +jvd  Chest rales bibasilar RRR no MRG Abd soft ntnd no mass or ascites +bs GU normal male MS no joint effusions or deformity Ext diffuse 2+ pitting edema Neuro is alert, Ox 3, gen'd weak   CXR 10/12 am > bilat LL densities R > L atx vs consolidation, no gross edema   CT chest 10/6 - diffuse ground glass bilat, RLL consolidation/ air bronchograms   Home meds:  - glimepiride 4 hs/ insulin aspart 4u tid/ insulin detemir 14 qd  - midodrine 10 mg prehd mwf  - albuterol prn/ budesonide-formoterol 2 bid/ ipratropium-albuterol qid nebs  - lovastatin 20 qd/ gabapentin 100 tid  - prns/ vitamins/ supplements/ creams   Dialysis: [facility] MWF [facility]  L AVF 90.5kg  Impression/ Plan: 1. SOB/ vol overload - 4.5 L off on HD yest w/ no BP drops. Sig lean body wt loss, will need serial HD prob 2 more to get extra volume off and improve breathing.  Have d/w pmd.  HD tomorrow, max UF as tol.  2. PNA - HCAP/ RLL, on IV vanc/ cefepime  3. ESRD on HD - [facility] in [place] MWF sched.  4. DM on insulin 5. Recent DVT LE - sp IVC filter, eliquis dc'd due to worsening anemia 6. H/o CVA 7. H/o bladder cancer 8. Debility - prob multifactorial, from SNF    Mark Splinter MD [facility] pager [phone]   01/13/2018, 1:25 PM   Recent Labs  Lab 01/07/18 0421 01/12/18 0122 01/13/18 0340  NA 133* 137 137  K 5.7* 4.4 4.7  CL 92* 98 97*  CO2 25 26 24   GLUCOSE 194* 170* 371*  BUN 103* 42* 30*  CREATININE 7.52* 4.28* 3.26*  CALCIUM 6.0* 6.4* 6.8*  PHOS 6.3* 3.9  --   ALBUMIN 2.8* 2.5*  --   INR  --  1.41  --    Recent Labs  Lab 01/12/18 0122  AST 19  ALT 20  ALKPHOS 56  BILITOT 1.0  PROT 5.2*   Recent Labs  Lab 01/12/18 0122 01/13/18 0340  WBC 3.4* 2.4*  NEUTROABS 3.0  --   HGB 7.5* 7.0*  HCT 24.4* 22.6*  MCV 96.1 96.6  PLT 108* 86*

## 2018-01-13 NOTE — Progress Notes (Signed)
01/13/2018 Patient blood sugar was 431 at 1651. Dr Posey Pronto was made aware. Patient place on sliding scale insulin [facility].

## 2018-01-13 NOTE — Progress Notes (Signed)
[facility] Progress Note  Patient: Mark [name]. YWV:[id number]   PCP: Albina Billet, MD DOB: 03/25/1946   DOA: 01/12/2018   DOS: 01/13/2018   Date of Service: the patient was seen and examined on 01/13/2018  Brief hospital course: Pt. with PMH of ESRD on HD MWF, CVA, HTN, DM 2, bladder cancer, neuropathy; admitted on 01/12/2018, presented with complaint of shortness of breath, was found to have sepsis secondary to acute respiratory infection as well as acute volume overload. Currently further plan is continue urgent HD and treatment with IV antibiotics.  Subjective: Feeling better but still not back to nose line.  No nausea no vomiting.  No chest pain right now.  No fever overnight.  No diarrhea.  No constipation.  Assessment and Plan: 1.  Acute on chronic hypoxic respiratory failure. Possible healthcare associated pneumonia. Most likely viral infection. Presents with shortness of breath, x-ray shows possible infiltrate. Influenza PCR negative. Blood cultures positive pending. Patient had a temp of 104 on admission. Patient is probably suffering from viral bronchitis although patient was given empiric IV antibiotics given his recent hospitalization as well as ESRD status. We will continue with IV cefepime and vancomycin with HD. Follow-up on cultures. Low threshold to discontinue antibiotics. Check respiratory virus pathogen panel. Discontinue Tamiflu. Continue steroids continue DuoNeb's. Patient uses 4 L of oxygen at home at rest and 4-1/2 on exertion.  Currently on 4 L of oxygen. Still has bilateral expiratory wheezing.  2.  ESRD on HD MWF Acute on chronic diastolic CHF. Acute volume overload. Appreciate nephrology assistance in arranging urgent HD. Four-point liters off on HD on 01/12/2018. Will need serial HD for 2 more sessions.  3.  Recent DVT. Anemia chronic kidney disease S/P IVC filter. Patient was on Eliquis but it was discontinued due to worsening  anemia. Patient did not have any active bleeding. Management of anemia per nephrology.  4.  Type 2 diabetes mellitus. Uncontrolled with hyperglycemia.  Hypoglycemic on admission Patient is on low-dose Levemir at home currently on hold. Continue sliding scale insulin. Carb modified diet.  5.  History of bladder cancer and CLL. HLD on pravastatin.  Diet: Renal carb modified diet DVT Prophylaxis: mechanical compression device  Advance goals of care discussion: full code  Family Communication: no family was present at bedside, at the time of interview.   Disposition:  Discharge to home.  Patient is from an SNF and he does not want to go back to the same SNF.  Consultants: Nephrology Procedures: HD  Scheduled Meds: . [START ON 01/14/2018] Chlorhexidine Gluconate Cloth  6 each Topical Q0600  . feeding supplement (ENSURE ENLIVE)  237 mL Oral Daily  . gabapentin  100 mg Oral TID  . [START ON 01/14/2018] insulin aspart  0-15 Units Subcutaneous TID WC  . insulin aspart  0-5 Units Subcutaneous QHS  . insulin aspart  15 Units Subcutaneous Once  . metoCLOPramide  5 mg Oral TID AC  . [START ON 01/14/2018] midodrine  10 mg Oral Q M,W,F-HD  . mometasone-formoterol  2 puff Inhalation BID  . multivitamin  1 tablet Oral QHS  . polyethylene glycol  17 g Oral Daily  . pravastatin  20 mg Oral q1800  . prednisoLONE acetate  1 drop Left Eye BID  . [START ON 01/14/2018] predniSONE  20 mg Oral Q breakfast   And  . [START ON 01/15/2018] predniSONE  10 mg Oral Q breakfast  . senna-docusate  1 tablet Oral BID  . torsemide  100 mg Oral Once per day on Sun Tue Thu Sat   Continuous Infusions: . [START ON 01/14/2018] ceFEPime (MAXIPIME) IV    . [START ON 01/14/2018] vancomycin     PRN Meds: docusate sodium, fluticasone, ipratropium-albuterol, polyvinyl alcohol, sodium chloride, sodium chloride flush Antibiotics: Anti-infectives (From admission, onward)   Start     Dose/Rate Route Frequency  Ordered Stop   01/14/18 1200  vancomycin (VANCOCIN) IVPB 1000 mg/200 mL premix     1,000 mg 200 mL/hr over 60 Minutes Intravenous Every M-W-F (Hemodialysis) 01/12/18 0550     01/14/18 1200  ceFEPIme (MAXIPIME) 2 g in sodium chloride 0.9 % 100 mL IVPB     2 g 200 mL/hr over 30 Minutes Intravenous Every M-W-F (Hemodialysis) 01/13/18 0804     01/12/18 2200  ceFEPIme (MAXIPIME) 1 g in sodium chloride 0.9 % 100 mL IVPB  Status:  Discontinued     1 g 200 mL/hr over 30 Minutes Intravenous Every 24 hours 01/12/18 0550 01/13/18 0804   01/12/18 1100  oseltamivir (TAMIFLU) capsule 30 mg  Status:  Discontinued     30 mg Oral 2 times daily 01/12/18 1017 01/12/18 1054   01/12/18 0115  vancomycin (VANCOCIN) 2,000 mg in sodium chloride 0.9 % 500 mL IVPB     2,000 mg 250 mL/hr over 120 Minutes Intravenous  Once 01/12/18 0101 01/12/18 0459   01/12/18 0100  vancomycin (VANCOCIN) IVPB 1000 mg/200 mL premix  Status:  Discontinued     1,000 mg 200 mL/hr over 60 Minutes Intravenous  Once 01/12/18 0059 01/12/18 0101   01/12/18 0100  ceFEPIme (MAXIPIME) 2 g in sodium chloride 0.9 % 100 mL IVPB     2 g 200 mL/hr over 30 Minutes Intravenous  Once 01/12/18 0059 01/12/18 0215       Objective: Physical Exam: Vitals:   01/13/18 0735 01/13/18 1100 01/13/18 1312 01/13/18 1650  BP: (!) 131/53 (!) 157/66  (!) 154/63  Pulse: 68 95  85  Resp: 18 16  20   Temp: 97.6 F (36.4 C) 97.9 F (36.6 C)  98.8 F (37.1 C)  TempSrc: Oral Oral  Oral  SpO2: 97% 93% 100% 91%  Weight:      Height:        Intake/Output Summary (Last 24 hours) at 01/13/2018 1741 Last data filed at 01/13/2018 0910 Gross per 24 hour  Intake 100 ml  Output 4715 ml  Net -4615 ml   Filed Weights   01/12/18 0048 01/12/18 0549 01/12/18 2245  Weight: 96 kg 94.4 kg 89.9 kg   General: Alert, Awake and Oriented to Time, Place and Person. Appear in moderate distress, affect appropriate Eyes: PERRL, Conjunctiva normal ENT: Oral Mucosa clear  moist. Neck: difficult to assess  JVD, no Abnormal Mass Or lumps Cardiovascular: S1 and S2 Present, aortic systolic  Murmur, Peripheral Pulses Present Respiratory: normal respiratory effort, Bilateral Air entry equal and Decreased, no use of accessory muscle, bilateral  Crackles, bilateral expiratory  wheezes Abdomen: Bowel Sound present, Soft and no tenderness, no hernia Skin: no redness, no Rash, non induration Extremities: trace Pedal edema, no calf tenderness Neurologic: Grossly no focal neuro deficit. Bilaterally Equal motor strength  Data Reviewed: CBC: Recent Labs  Lab 01/06/18 2216 01/07/18 0421 01/08/18 0605 01/12/18 0122 01/13/18 0340  WBC  --  5.8 4.9 3.4* 2.4*  NEUTROABS  --   --   --  3.0  --   HGB 7.9* 7.8* 8.4* 7.5* 7.0*  HCT  --  23.5* 25.4*  24.4* 22.6*  MCV  --  90.6 92.4 96.1 96.6  PLT  --  76* 81* 108* 86*   Basic Metabolic Panel: Recent Labs  Lab 01/07/18 0421 01/12/18 0122 01/13/18 0340  NA 133* 137 137  K 5.7* 4.4 4.7  CL 92* 98 97*  CO2 25 26 24   GLUCOSE 194* 170* 371*  BUN 103* 42* 30*  CREATININE 7.52* 4.28* 3.26*  CALCIUM 6.0* 6.4* 6.8*  MG  --  1.8  --   PHOS 6.3* 3.9  --     Liver Function Tests: Recent Labs  Lab 01/07/18 0421 01/12/18 0122  AST  --  19  ALT  --  20  ALKPHOS  --  56  BILITOT  --  1.0  PROT  --  5.2*  ALBUMIN 2.8* 2.5*   No results for input(s): LIPASE, AMYLASE in the last 168 hours. No results for input(s): AMMONIA in the last 168 hours. Coagulation Profile: Recent Labs  Lab 01/12/18 0122  INR 1.41   Cardiac Enzymes: No results for input(s): CKTOTAL, CKMB, CKMBINDEX, TROPONINI in the last 168 hours. BNP (last 3 results) No results for input(s): PROBNP in the last 8760 hours. CBG: Recent Labs  Lab 01/13/18 0037 01/13/18 0432 01/13/18 0738 01/13/18 1159 01/13/18 1651  GLUCAP 239* 356* 319* 322* 431*   Studies: No results found.   Time spent: 35 minutes  Author: Berle Mull, MD [facility] Pager: [phone] 01/13/2018 5:41 PM  If 7PM-7AM, please contact night-coverage at [email or web address], password [facility]

## 2018-01-14 LAB — CBC
HEMATOCRIT: 21.1 % — AB (ref 39.0–52.0)
Hemoglobin: 6.5 g/dL — CL (ref 13.0–17.0)
MCH: 29.4 pg (ref 26.0–34.0)
MCHC: 30.8 g/dL (ref 30.0–36.0)
MCV: 95.5 fL (ref 80.0–100.0)
Platelets: 104 10*3/uL — ABNORMAL LOW (ref 150–400)
RBC: 2.21 MIL/uL — AB (ref 4.22–5.81)
RDW: 16.1 % — ABNORMAL HIGH (ref 11.5–15.5)
WBC: 4 10*3/uL (ref 4.0–10.5)
nRBC: 0 % (ref 0.0–0.2)

## 2018-01-14 LAB — GLUCOSE, CAPILLARY
GLUCOSE-CAPILLARY: 459 mg/dL — AB (ref 70–99)
GLUCOSE-CAPILLARY: 136 mg/dL — AB (ref 70–99)
GLUCOSE-CAPILLARY: 171 mg/dL — AB (ref 70–99)
GLUCOSE-CAPILLARY: 262 mg/dL — AB (ref 70–99)
Glucose-Capillary: 94 mg/dL (ref 70–99)
Glucose-Capillary: 144 mg/dL — ABNORMAL HIGH (ref 70–99)

## 2018-01-14 LAB — HEMOGLOBIN A1C
HEMOGLOBIN A1C: 6.5 % — AB (ref 4.8–5.6)
Mean Plasma Glucose: 139.85 mg/dL

## 2018-01-14 LAB — ABO/RH: ABO/RH(D): B POS

## 2018-01-14 LAB — PREPARE RBC (CROSSMATCH)

## 2018-01-14 MED ORDER — SODIUM CHLORIDE 0.9% IV SOLUTION: Freq: Once | INTRAVENOUS | Status: DC

## 2018-01-14 MED ORDER — ALBUTEROL SULFATE (2.5 MG/3ML) 0.083% IN NEBU: 2.5000 mg | INHALATION_SOLUTION | Freq: Four times a day (QID) | RESPIRATORY_TRACT | Status: DC | PRN

## 2018-01-14 MED ORDER — CHLORHEXIDINE GLUCONATE CLOTH 2 % EX PADS
6.0000 | MEDICATED_PAD | Freq: Every day | CUTANEOUS | Status: DC
  Administered 2018-01-14: 6 via TOPICAL

## 2018-01-14 MED ORDER — IPRATROPIUM-ALBUTEROL 0.5-2.5 (3) MG/3ML IN SOLN
3.0000 mL | Freq: Three times a day (TID) | RESPIRATORY_TRACT | Status: DC
  Administered 2018-01-14 – 2018-01-15 (×2): 3 mL via RESPIRATORY_TRACT
  Filled 2018-01-14 (×2): qty 3

## 2018-01-14 NOTE — Progress Notes (Signed)
[facility] Progress Note  Patient: Mark [name]. YNW:[id number]   PCP: Albina Billet, MD DOB: April 29, 1945   DOA: 01/12/2018   DOS: 01/14/2018   Date of Service: the patient was seen and examined on 01/14/2018  Brief hospital course: Pt. with PMH of ESRD on HD MWF, CVA, HTN, DM 2, bladder cancer, neuropathy; admitted on 01/12/2018, presented with complaint of shortness of breath, was found to have sepsis secondary to acute respiratory infection as well as acute volume overload. Currently further plan is continue urgent HD and treatment with IV antibiotics.  Subjective: Feeling better, breathing better.  No nausea no vomiting.  Assessment and Plan: 1.  Acute on chronic hypoxic respiratory failure. Possible healthcare associated pneumonia. Rhinovirus bronchitis Presents with shortness of breath, x-ray shows possible infiltrate. Influenza PCR negative. Blood cultures positive pending. Patient had a temp of 104 on admission. Patient is probably suffering from viral bronchitis although patient was given empiric IV antibiotics given his recent hospitalization as well as ESRD status. We will monitor the patient off of antibiotics since the respiratory panel is positive for rhinovirus and culture so far negative with negative chest x-ray. Continue steroids continue DuoNeb's. Patient uses 4 L of oxygen at home at rest and 4-1/2 on exertion.  Currently on 4 L of oxygen. Still has bilateral expiratory wheezing.  2.  ESRD on HD MWF Acute on chronic diastolic CHF. Acute volume overload. Appreciate nephrology assistance in arranging urgent HD. Four liters off on HD on 01/12/2018. Will need serial HD for 2 more sessions.  3.  Recent DVT. Anemia chronic kidney disease S/P IVC filter. Patient was on Eliquis but it was discontinued due to worsening anemia. Patient did not have any active bleeding. Management of anemia per nephrology.  4.  Type 2 diabetes mellitus. Uncontrolled with  hyperglycemia.  Hypoglycemic on admission Patient is on low-dose Levemir at home currently on hold. Continue sliding scale insulin. Carb modified diet.  5.  History of bladder cancer and CLL. HLD on pravastatin.  Diet: Renal carb modified diet DVT Prophylaxis: mechanical compression device  Advance goals of care discussion: full code  Family Communication: no family was present at bedside, at the time of interview.   Disposition:  Discharge to home.  Likely tomorrow. Patient is from an SNF and he does not want to go back to the same SNF.  Consultants: Nephrology Procedures: HD  Scheduled Meds: . sodium chloride   Intravenous Once  . Chlorhexidine Gluconate Cloth  6 each Topical Q0600  . feeding supplement (ENSURE ENLIVE)  237 mL Oral Daily  . gabapentin  100 mg Oral TID  . insulin aspart  0-15 Units Subcutaneous TID WC  . insulin aspart  0-5 Units Subcutaneous QHS  . ipratropium-albuterol  3 mL Nebulization TID  . metoCLOPramide  5 mg Oral TID AC  . midodrine  10 mg Oral Q M,W,F-HD  . mometasone-formoterol  2 puff Inhalation BID  . multivitamin  1 tablet Oral QHS  . polyethylene glycol  17 g Oral Daily  . pravastatin  20 mg Oral q1800  . prednisoLONE acetate  1 drop Left Eye BID  . [START ON 01/15/2018] predniSONE  10 mg Oral Q breakfast  . senna-docusate  1 tablet Oral BID  . torsemide  100 mg Oral Once per day on Sun Tue Thu Sat   Continuous Infusions:  PRN Meds: albuterol, docusate sodium, fluticasone, polyvinyl alcohol, sodium chloride, sodium chloride flush Antibiotics: Anti-infectives (From admission, onward)   Start  Dose/Rate Route Frequency Ordered Stop   01/14/18 1200  vancomycin (VANCOCIN) IVPB 1000 mg/200 mL premix  Status:  Discontinued     1,000 mg 200 mL/hr over 60 Minutes Intravenous Every M-W-F (Hemodialysis) 01/12/18 0550 01/14/18 0624   01/14/18 1200  ceFEPIme (MAXIPIME) 2 g in sodium chloride 0.9 % 100 mL IVPB  Status:  Discontinued     2  g 200 mL/hr over 30 Minutes Intravenous Every M-W-F (Hemodialysis) 01/13/18 0804 01/14/18 0624   01/12/18 2200  ceFEPIme (MAXIPIME) 1 g in sodium chloride 0.9 % 100 mL IVPB  Status:  Discontinued     1 g 200 mL/hr over 30 Minutes Intravenous Every 24 hours 01/12/18 0550 01/13/18 0804   01/12/18 1100  oseltamivir (TAMIFLU) capsule 30 mg  Status:  Discontinued     30 mg Oral 2 times daily 01/12/18 1017 01/12/18 1054   01/12/18 0115  vancomycin (VANCOCIN) 2,000 mg in sodium chloride 0.9 % 500 mL IVPB     2,000 mg 250 mL/hr over 120 Minutes Intravenous  Once 01/12/18 0101 01/12/18 0459   01/12/18 0100  vancomycin (VANCOCIN) IVPB 1000 mg/200 mL premix  Status:  Discontinued     1,000 mg 200 mL/hr over 60 Minutes Intravenous  Once 01/12/18 0059 01/12/18 0101   01/12/18 0100  ceFEPIme (MAXIPIME) 2 g in sodium chloride 0.9 % 100 mL IVPB     2 g 200 mL/hr over 30 Minutes Intravenous  Once 01/12/18 0059 01/12/18 0215       Objective: Physical Exam: Vitals:   01/14/18 1642 01/14/18 1657 01/14/18 1715 01/14/18 1730  BP: (!) 155/74 (!) 154/72 (!) 167/72 (!) 161/69  Pulse: 78 76 76 68  Resp: 18 (!) 22 (!) 22   Temp: 97.6 F (36.4 C) 97.8 F (36.6 C)    TempSrc: Axillary Axillary    SpO2:      Weight:      Height:        Intake/Output Summary (Last 24 hours) at 01/14/2018 1741 Last data filed at 01/14/2018 1620 Gross per 24 hour  Intake 455 ml  Output 40 ml  Net 415 ml   Filed Weights   01/12/18 0549 01/12/18 2245 01/14/18 0500  Weight: 94.4 kg 89.9 kg 90 kg   General: Alert, Awake and Oriented to Time, Place and Person. Appear in moderate distress, affect appropriate Eyes: PERRL, Conjunctiva normal ENT: Oral Mucosa clear moist. Neck: difficult to assess  JVD, no Abnormal Mass Or lumps Cardiovascular: S1 and S2 Present, aortic systolic  Murmur, Peripheral Pulses Present Respiratory: normal respiratory effort, Bilateral Air entry equal and Decreased, no use of accessory muscle,  bilateral  Crackles, bilateral expiratory  wheezes Abdomen: Bowel Sound present, Soft and no tenderness, no hernia Skin: no redness, no Rash, non induration Extremities: trace Pedal edema, no calf tenderness Neurologic: Grossly no focal neuro deficit. Bilaterally Equal motor strength  Data Reviewed: CBC: Recent Labs  Lab 01/08/18 0605 01/12/18 0122 01/13/18 0340 01/14/18 0523  WBC 4.9 3.4* 2.4* 4.0  NEUTROABS  --  3.0  --   --   HGB 8.4* 7.5* 7.0* 6.5*  HCT 25.4* 24.4* 22.6* 21.1*  MCV 92.4 96.1 96.6 95.5  PLT 81* 108* 86* 026*   Basic Metabolic Panel: Recent Labs  Lab 01/12/18 0122 01/13/18 0340 01/13/18 2225 01/14/18 1427  NA 137 137  --  138  K 4.4 4.7  --  4.8  CL 98 97*  --  97*  CO2 26 24  --  24  GLUCOSE 170* 371* 404* 314*  BUN 42* 30*  --  67*  CREATININE 4.28* 3.26*  --  6.02*  CALCIUM 6.4* 6.8*  --  6.4*  MG 1.8  --   --   --   PHOS 3.9  --   --  4.8*    Liver Function Tests: Recent Labs  Lab 01/12/18 0122 01/14/18 1427  AST 19  --   ALT 20  --   ALKPHOS 56  --   BILITOT 1.0  --   PROT 5.2*  --   ALBUMIN 2.5* 2.5*   No results for input(s): LIPASE, AMYLASE in the last 168 hours. No results for input(s): AMMONIA in the last 168 hours. Coagulation Profile: Recent Labs  Lab 01/12/18 0122  INR 1.41   Cardiac Enzymes: No results for input(s): CKTOTAL, CKMB, CKMBINDEX, TROPONINI in the last 168 hours. BNP (last 3 results) No results for input(s): PROBNP in the last 8760 hours. CBG: Recent Labs  Lab 01/13/18 2009 01/13/18 2326 01/14/18 0552 01/14/18 0735 01/14/18 1217  GLUCAP 459* 332* 136* 94 171*   Studies: No results found.   Time spent: 35 minutes  Author: Berle Mull, MD [facility] Pager: [phone] 01/14/2018 5:41 PM  If 7PM-7AM, please contact night-coverage at [email or web address], password [facility]

## 2018-01-14 NOTE — Clinical Social Work Note (Signed)
Pt refusing SNF at this time stating him and his family have decided he would go home with home health. RNCM notified. Clinical Social Worker will sign off for now as social work intervention is no longer needed. Please consult [place] again if new need arises.   Shelton Silvas A Thyra Yinger 01/14/2018

## 2018-01-14 NOTE — Progress Notes (Signed)
[facility] Progress Note  Subjective: no new c/o, wts stable  Vitals:   01/13/18 1650 01/13/18 2234 01/13/18 2300 01/14/18 0500  BP: (!) 154/63  (!) 154/67   Pulse: 85  92   Resp: 20  20   Temp: 98.8 F (37.1 C)  98.8 F (37.1 C)   TempSrc: Oral  Oral   SpO2: 91% 92% 94%   Weight:    90 kg  Height:        Inpatient medications: . sodium chloride   Intravenous Once  . Chlorhexidine Gluconate Cloth  6 each Topical Q0600  . feeding supplement (ENSURE ENLIVE)  237 mL Oral Daily  . gabapentin  100 mg Oral TID  . insulin aspart  0-15 Units Subcutaneous TID WC  . insulin aspart  0-5 Units Subcutaneous QHS  . metoCLOPramide  5 mg Oral TID AC  . midodrine  10 mg Oral Q M,W,F-HD  . mometasone-formoterol  2 puff Inhalation BID  . multivitamin  1 tablet Oral QHS  . polyethylene glycol  17 g Oral Daily  . pravastatin  20 mg Oral q1800  . prednisoLONE acetate  1 drop Left Eye BID  . predniSONE  20 mg Oral Q breakfast   And  . [START ON 01/15/2018] predniSONE  10 mg Oral Q breakfast  . senna-docusate  1 tablet Oral BID  . torsemide  100 mg Oral Once per day on Sun Tue Thu Sat    docusate sodium, fluticasone, ipratropium-albuterol, polyvinyl alcohol, sodium chloride, sodium chloride flush  Iron/TIBC/Ferritin/ %Sat    Component Value Date/Time   IRON 29 (L) 01/25/2017 0844   IRON 78 01/31/2013 1134   TIBC 213 (L) 01/25/2017 0844   TIBC 265 01/31/2013 1134   FERRITIN 601 (H) Jan 04, 202018 1123   IRONPCTSAT 14 (L) 01/25/2017 0844   IRONPCTSAT 29 01/31/2013 1134    Exam: Gen nasal O2, ^wob  No rash, cyanosis or gangrene Sclera anicteric, throat clear  +jvd  Chest rales bibasilar RRR no MRG Abd soft ntnd no mass or ascites +bs GU normal male MS no joint effusions or deformity Ext diffuse 2+ pitting edema Neuro is alert, Ox 3, gen'd weak   CXR 10/12 am > bilat LL densities R > L atx vs consolidation, no gross edema   CT chest 10/6 - diffuse ground glass  bilat, RLL consolidation/ air bronchograms   Home meds:  - glimepiride 4 hs/ insulin aspart 4u tid/ insulin detemir 14 qd  - midodrine 10 mg prehd mwf  - albuterol prn/ budesonide-formoterol 2 bid/ ipratropium-albuterol qid nebs  - lovastatin 20 qd/ gabapentin 100 tid  - prns/ vitamins/ supplements/ creams   Dialysis: [facility] MWF [facility]  L AVF 90.5kg  Impression/ Plan: 1. SOB/ vol overload/ anasarca - improving SOB, plan HD again today and probably tomorrow to get to a new lower dry wt and resolved marked vol overload 2. PNA - HCAP/ RLL, on IV vanc/ cefepime  3. ESRD on HD - [facility] in [place] MWF sched.  4. DM on insulin 5. Recent DVT LE - sp IVC filter, eliquis dc'd due to worsening anemia 6. H/o CVA 7. H/o bladder cancer 8. Debility - prob multifactorial, from SNF    Kelly Splinter MD [place] pager [phone]   01/14/2018, 7:31 AM   Recent Labs  Lab 01/12/18 0122 01/13/18 0340 01/13/18 2225  NA 137 137  --   K 4.4 4.7  --   CL 98 97*  --  CO2 26 24  --   GLUCOSE 170* 371* 404*  BUN 42* 30*  --   CREATININE 4.28* 3.26*  --   CALCIUM 6.4* 6.8*  --   PHOS 3.9  --   --   ALBUMIN 2.5*  --   --   INR 1.41  --   --    Recent Labs  Lab 01/12/18 0122  AST 19  ALT 20  ALKPHOS 56  BILITOT 1.0  PROT 5.2*   Recent Labs  Lab 01/12/18 0122 01/13/18 0340 01/14/18 0523  WBC 3.4* 2.4* 4.0  NEUTROABS 3.0  --   --   HGB 7.5* 7.0* 6.5*  HCT 24.4* 22.6* 21.1*  MCV 96.1 96.6 95.5  PLT 108* 86* 104*

## 2018-01-14 NOTE — Progress Notes (Addendum)
PT Cancellation Note  Patient Details Name: Mark [name]. MRN: [id number] DOB: 07-14-1945   Cancelled Treatment:    Reason Eval/Treat Not Completed: Patient at procedure or test/unavailable.  Pt at HD and will not be done until 18:15 per HD RN and likely not back to his room until around 19:00.  Unfortunately, we did not get to him before HD.  PT will re-attempt tomorrow AM for imminent d/c.     Thanks,    Barbarann Ehlers. Detria Cummings, PT, DPT  Acute Rehabilitation [phone] pager #[phone] office   01/14/2018, 2:37 PM   01/14/2018 Addendum @16 :37- I spoke with the HD unit and they anticipate that they will take pt 1st shift tomorrow, so he will go to HD between 6 and 7 am.  PT will see after HD in the AM.  I also saw CSW's note that he is refusing SNF placement.    Thanks,   Barbarann Ehlers. Salote Weidmann, PT, DPT  Acute Rehabilitation [phone] pager [phone]) [phone] office

## 2018-01-14 NOTE — Clinical Social Work Note (Signed)
Clinical Social Work Assessment  Patient Details  Name: Mark [name]. MRN: [id number] Date of Birth: 10/07/1945  Date of referral:  01/14/18               Reason for consult:  Facility Placement                Permission sought to share information with:  Family Supports Permission granted to share information::  Yes, Release of Information Signed  Name::     [name]  Agency::     Relationship::  Daughter  Contact Information:  [phone]  Housing/Transportation Living arrangements for the past 2 months:  [place] of Information:  Patient Patient Interpreter Needed:  None Criminal Activity/Legal Involvement Pertinent to Current Situation/Hospitalization:  No - Comment as needed Significant Relationships:  Adult Children, Spouse Lives with:  Spouse Do you feel safe going back to the place where you live?  Yes Need for family participation in patient care:  Yes (Comment)  Care giving concerns:  Pt is alert and oriented   Facilities manager / plan:  CSW spoke with pt at bedside. Pt lives with spouse. Pt has been to [place] in the past where he had a "bad" experience. Pt states him and his family have decided pt will go home with home health. CSW notified RNCM.   Employment status:  Retired Nurse, adult PT Recommendations:  [place] / Referral to community resources:  [place]  Patient/Family's Response to care:  Pt verbalized understanding of CSW role and expressed appreciation for support. Pt denies any concern regarding pt care at this time.   Patient/Family's Understanding of and Emotional Response to Diagnosis, Current Treatment, and Prognosis:  Pt is declining SNF at this time. Pt's responses emotionally appropriate during conversation with CSW. Pt denies any concern regarding treatment plan at this time. CSW will continue to provide support and facilitate d/c  needs.     Emotional Assessment Appearance:  Appears stated age Attitude/Demeanor/Rapport:  (Patient was appropriate) Affect (typically observed):  Accepting, Appropriate Orientation:  Oriented to Self, Oriented to Place, Oriented to  Time, Oriented to Situation Alcohol / Substance use:  Not Applicable Psych involvement (Current and /or in the community):  No (Comment)  Discharge Needs  Concerns to be addressed:  Patient refuses services, Care Coordination, Basic Needs Readmission within the last 30 days:  Yes Current discharge risk:  Dependent with Mobility Barriers to Discharge:  Continued Medical Work up   [employer], LCSW 01/14/2018, 2:40 PM

## 2018-01-14 NOTE — Progress Notes (Signed)
Inpatient Diabetes Program Recommendations  AACE/ADA: New Consensus Statement on Inpatient Glycemic Control (2015)  Target Ranges:  Prepandial:   less than 140 mg/dL      Peak postprandial:   less than 180 mg/dL (1-2 hours)      Critically ill patients:  140 - 180 mg/dL   Lab Results  Component Value Date   GLUCAP 171 (H) 01/14/2018   HGBA1C 6.5 (H) 01/14/2018    Review of Glycemic Control Results for Mark [name], Mark [name] (MRN [id number]) as of 01/14/2018 12:23  Ref. Range 01/13/2018 20:09 01/13/2018 23:26 01/14/2018 05:52 01/14/2018 07:35 01/14/2018 12:17  Glucose-Capillary Latest Ref Range: 70 - 99 mg/dL 459 (H) 332 (H) 136 (H) 94 171 (H)   Diabetes history: Type 2 DM Outpatient Diabetes medications: Amaryl 4 mg QHS, Novolog 4 units TID, Levemir 14 units QD, Levemir 5-6 units QHS Current orders for Inpatient glycemic control: Novolog 0-15 units TID, Novolog 0-5 units QHS Prednisone 10 mg QAM  Inpatient Diabetes Program Recommendations:    Consider decreasing correction to Novolog 0-9 units TID.  Thanks,  Bronson Curb, MSN, RNC-OB Diabetes Coordinator [phone] (8a-5p)

## 2018-01-15 LAB — RENAL FUNCTION PANEL
ALBUMIN: 2.5 g/dL — AB (ref 3.5–5.0)
ALBUMIN: 2.7 g/dL — AB (ref 3.5–5.0)
ANION GAP: 17 — AB (ref 5–15)
Anion gap: 13 (ref 5–15)
BUN: 67 mg/dL — ABNORMAL HIGH (ref 8–23)
BUN: 37 mg/dL — ABNORMAL HIGH (ref 8–23)
CALCIUM: 6.4 mg/dL — AB (ref 8.9–10.3)
CHLORIDE: 99 mmol/L (ref 98–111)
CO2: 24 mmol/L (ref 22–32)
CO2: 27 mmol/L (ref 22–32)
Calcium: 7 mg/dL — ABNORMAL LOW (ref 8.9–10.3)
Chloride: 97 mmol/L — ABNORMAL LOW (ref 98–111)
Creatinine, Ser: 6.02 mg/dL — ABNORMAL HIGH (ref 0.61–1.24)
Creatinine, Ser: 4.08 mg/dL — ABNORMAL HIGH (ref 0.61–1.24)
GFR calc Af Amer: 10 mL/min — ABNORMAL LOW (ref >60)
GFR calc non Af Amer: 8 mL/min — ABNORMAL LOW (ref >60)
GFR calc non Af Amer: 13 mL/min — ABNORMAL LOW (ref >60)
GFR, EST AFRICAN AMERICAN: 15 mL/min — AB (ref >60)
GLUCOSE: 237 mg/dL — AB (ref 70–99)
Glucose, Bld: 314 mg/dL — ABNORMAL HIGH (ref 70–99)
Phosphorus: 4.8 mg/dL — ABNORMAL HIGH (ref 2.5–4.6)
Phosphorus: 3.6 mg/dL (ref 2.5–4.6)
Potassium: 4.8 mmol/L (ref 3.5–5.1)
Potassium: 3.6 mmol/L (ref 3.5–5.1)
SODIUM: 138 mmol/L (ref 135–145)
SODIUM: 139 mmol/L (ref 135–145)

## 2018-01-15 LAB — TYPE AND SCREEN
ABO/RH(D): B POS
Antibody Screen: NEGATIVE
UNIT DIVISION: 0
UNIT DIVISION: 0

## 2018-01-15 LAB — GLUCOSE, CAPILLARY
Glucose-Capillary: 108 mg/dL — ABNORMAL HIGH (ref 70–99)
Glucose-Capillary: 241 mg/dL — ABNORMAL HIGH (ref 70–99)

## 2018-01-15 LAB — CBC
HEMATOCRIT: 28.1 % — AB (ref 39.0–52.0)
HEMOGLOBIN: 8.8 g/dL — AB (ref 13.0–17.0)
MCH: 28.8 pg (ref 26.0–34.0)
MCHC: 31.3 g/dL (ref 30.0–36.0)
MCV: 91.8 fL (ref 80.0–100.0)
Platelets: 72 10*3/uL — ABNORMAL LOW (ref 150–400)
RBC: 3.06 MIL/uL — ABNORMAL LOW (ref 4.22–5.81)
RDW: 17.2 % — ABNORMAL HIGH (ref 11.5–15.5)
WBC: 3.7 10*3/uL — ABNORMAL LOW (ref 4.0–10.5)
nRBC: 0 % (ref 0.0–0.2)

## 2018-01-15 LAB — BPAM RBC
BLOOD PRODUCT EXPIRATION DATE: 201911012359
Blood Product Expiration Date: 201911052359
ISSUE DATE / TIME: 201910141523
ISSUE DATE / TIME: 201910141523
UNIT TYPE AND RH: 7300
Unit Type and Rh: 7300

## 2018-01-15 MED ORDER — POLYETHYLENE GLYCOL 3350 17 G PO PACK: 17.0000 g | PACK | Freq: Every day | ORAL | 0 refills | Status: AC

## 2018-01-15 MED ORDER — MIDODRINE HCL 5 MG PO TABS
ORAL_TABLET | ORAL | Status: AC
  Administered 2018-01-15: 15:00:00
  Filled 2018-01-15: qty 2

## 2018-01-15 MED ORDER — IPRATROPIUM-ALBUTEROL 0.5-2.5 (3) MG/3ML IN SOLN: 3.0000 mL | Freq: Four times a day (QID) | RESPIRATORY_TRACT | Status: DC | PRN

## 2018-01-15 MED ORDER — HEPARIN SOD (PORK) LOCK FLUSH 100 UNIT/ML IV SOLN
500.0000 [IU] | INTRAVENOUS | Status: AC | PRN
  Administered 2018-01-15: 500 [IU]

## 2018-01-15 NOTE — Care Management Note (Addendum)
Case Management Note  Patient Details  Name: Mark [name]. MRN: [id number] Date of Birth: [birth year]  Subjective/Objective:    From [facility], does not want to go back per CSW,  Patient wants to go home with family.  He has had [facility] in the past and would like to work with [facility] again.  Referral given to [facility] for [facility], [place] , [place].  Soc will begin 24-48 hrs post dc. Patient will also need ambulatory sat check , he has home oxygen with [facility] but states his oxygen only goes to 5 liters and sometimes he needs more than this.  Patient liter flow has changed and MD has put in new order and Butch Penny with [facility] aware, Leroy Sea will give her the information. Leroy Sea brought a tank to room for patient to go home with.                 Action/Plan: NCM will follow for transition of care needs.   Expected Discharge Date:                  Expected Discharge Plan:  [place]  In-House Referral:     Discharge planning Services  CM Consult  Post Acute Care Choice:  Home Health Choice offered to:  Patient  DME Arranged:    DME Agency:     HH Arranged:    [place] Agency:  [facility]  Status of Service:  Completed, signed off  If discussed at [place] of Stay Meetings, dates discussed:    Additional Comments:  Zenon Mayo, RN 01/15/2018, 3:45 PM

## 2018-01-15 NOTE — Discharge Summary (Signed)
[facility] Discharge Summary   Patient: Mark [name]. TWS:[id number]   PCP: Albina Billet, MD DOB: February 25, 1946   Date of admission: 01/12/2018   Date of discharge: 01/15/2018     Discharge Diagnoses:  Active Problems:   Acute respiratory failure with hypoxia (HCC)   Acute respiratory failure (HCC)   Admitted From: home Disposition:  Home health, pt redused  SNF  Recommendations for Outpatient Follow-up:  1. Please follow up with PCP in  week   Follow-up Information    [facility] Follow up.   Specialty:  Home Health Services Why:  [facility], HHPT, [facility] Contact information: [place] [address] [phone]        Albina Billet, MD. Schedule an appointment as soon as possible for a visit in 1 week(s).   Specialty:  Internal Medicine Contact information: [address] [place] [address] [phone]          Diet recommendation: cardia cdiet  Activity: The patient is advised to gradually reintroduce usual activities.  Discharge Condition: stable  Code Status: full ocde  History of present illness: As per the H and P dictated on admission, "Patient is a 72 year old male, skilled nursing facility resident, with past medical history significant for recent pneumonia, ESRD on hemodialysis on Monday, Wednesday and Friday; CVA, lymphoma, diabetes mellitus, hypertension, bladder cancer, anemia and irritable bowel syndrome.  Patient was recently discharged from: [facility] on 01/08/2018 after inpatient care for acute pulmonary edema, elevated troponin, altered mental status, pneumonia and acute on chronic respiratory failure with hypoxemia.  Patient present from the skilled nursing facility with fever and chills that was reported to have started overnight.  Pressure of 104.6 F was reported.  Patient was in significant respiratory distress on presentation, necessitating BiPAP treatment.  Leukopenia is noted.  Chest  x-ray done on presentation revealed "Diffuse peribronchial cuffing concerning for an acute bronchitis.  Small to moderate right pleural effusion.  Low lung volumes with bibasilar opacities which may reflect areas of atelectasis and/or airspace consolidation".  On presentation to the ER, sepsis protocol was activated.  Patient was administered IV fluids as per sepsis protocol, and started on IV vancomycin and cefepime.  Patient has been able to, the BiPAP.  Patient could not or would not give any significant history.  Patient is noted to be sweaty.  Blood sugar done later was 28.  No headache, no neck pain, no chest pain, no sore throat, no GI symptoms and no urinary symptoms."  Hospital Course:  Summary of his active problems in the hospital is as following. 1.  Acute on chronic hypoxic respiratory failure. Possible healthcare associated pneumonia. Rhinovirus bronchitis Presents with shortness of breath, x-ray shows possible infiltrate. Influenza PCR negative. Blood cultures positive pending. Patient had a temp of 104 on admission. Patient is probably suffering from viral bronchitis although patient was given empiric IV antibiotics given his recent hospitalization as well as ESRD status. We will monitor the patient off of antibiotics since the respiratory panel is positive for rhinovirus and culture so far negative with negative chest x-ray. Continue steroids continue DuoNeb's. Patient uses 4 L of oxygen at home at rest and 4-1/2 on exertion.  Currently on 4 L of oxygen.  2.  ESRD on HD MWF Acute on chronic diastolic CHF. Acute volume overload. Appreciate nephrology assistance in arranging urgent HD. Four liters off on HD on 01/12/2018. Will need serial HD for 2 more sessions.  3.  Recent DVT. Anemia chronic kidney disease S/P IVC filter. Patient was on Eliquis but it was discontinued due to worsening anemia. Patient did not have any active bleeding. Management of anemia per  nephrology.  4.  Type 2 diabetes mellitus. Uncontrolled with hyperglycemia.  Hypoglycemic on admission Patient is on low-dose Levemir at home  Carb modified diet.  5.  History of bladder cancer and CLL. HLD on pravastatin.  All other chronic medical condition were stable during the hospitalization.  Patient was seen by physical therapy, who recommended home health, which was arranged by Education officer, museum and case Freight forwarder. On the day of the discharge the patient's vitals were stable , and no other acute medical condition were reported by patient. the patient was felt safe to be discharge at home with home health.  Consultants: nephrology Procedures: HD  DISCHARGE MEDICATION: Allergies as of 01/15/2018      Reactions   Acyclovir And Related    Heparin Other (See Comments)   Excessive bleeding per pt.    Ibuprofen Other (See Comments)   DUE TO DIALYSIS   Multivitamin [centrum] Other (See Comments)   DUE TO DIALYSIS   Daypro [oxaprozin] Swelling, Rash   Other reaction(s): Other (See Comments)   Tape Rash      Medication List    STOP taking these medications   amoxicillin-clavulanate 500-125 MG tablet Commonly known as:  AUGMENTIN     TAKE these medications   albuterol (2.5 MG/3ML) 0.083% nebulizer solution Commonly known as:  PROVENTIL Take 2.5 mg by nebulization every 6 (six) hours as needed for wheezing or shortness of breath.   budesonide-formoterol 160-4.5 MCG/ACT inhaler Commonly known as:  SYMBICORT Inhale 2 puffs into the lungs 2 (two) times daily.   docusate sodium 100 MG capsule Commonly known as:  COLACE Take 100 mg by mouth daily as needed for mild constipation.   DUREZOL 0.05 % Emul Generic drug:  Difluprednate Place 1 drop into the left eye 2 (two) times daily.   ENSURE PLUS Liqd Take 1 Can by mouth daily.   fluticasone 50 MCG/ACT nasal spray Commonly known as:  FLONASE Place 1 spray into both nostrils daily as needed for allergies.   gabapentin  100 MG capsule Commonly known as:  NEURONTIN Take 1 capsule (100 mg total) by mouth 3 (three) times daily.   glimepiride 4 MG tablet Commonly known as:  AMARYL Take 4 mg by mouth at bedtime. Prn glucose levels   guaiFENesin 600 MG 12 hr tablet Commonly known as:  MUCINEX Take 1 tablet (600 mg total) by mouth 2 (two) times daily.   guaiFENesin-dextromethorphan 100-10 MG/5ML syrup Commonly known as:  ROBITUSSIN DM Take 15 mLs by mouth every 4 (four) hours as needed for cough.   insulin aspart 100 UNIT/ML injection Commonly known as:  novoLOG Inject 4 Units into the skin 3 (three) times daily with meals.   insulin detemir 100 UNIT/ML injection Commonly known as:  LEVEMIR Inject 0.14 mLs (14 Units total) into the skin daily.   ipratropium-albuterol 0.5-2.5 (3) MG/3ML Soln Commonly known as:  DUONEB Take 3 mLs by nebulization 4 (four) times daily.   lovastatin 20 MG tablet Commonly known as:  MEVACOR Take 1 tablet by mouth daily.   metoCLOPramide 5 MG tablet Commonly known as:  REGLAN Take 5 mg by mouth 3 (three) times daily before meals. Patient takes 1 tab with each meal.   midodrine 10 MG tablet Commonly known as:  PROAMATINE Take 1 tablet (10  mg total) by mouth every dialysis (MWF).   multivitamin Tabs tablet Take 1 tablet by mouth at bedtime.   nystatin cream Commonly known as:  MYCOSTATIN Apply 1 application topically 2 (two) times daily as needed (irritation).   polyethylene glycol packet Commonly known as:  MIRALAX / GLYCOLAX Take 17 g by mouth daily. Start taking on:  01/16/2018   predniSONE 10 MG (21) Tbpk tablet Commonly known as:  STERAPRED UNI-PAK 21 TAB Start at 60mg  taper by 10mg  until complete   REFRESH 1.4-0.6 % Soln Generic drug:  Polyvinyl Alcohol-Povidone PF Apply 1 drop to eye 3 (three) times daily as needed (Dry eyes).   silver sulfADIAZINE 1 % cream Commonly known as:  SILVADENE APPLY 1 APPLICATION TOPICALY DAILY What changed:  See the  new instructions.   sodium chloride 0.65 % Soln nasal spray Commonly known as:  OCEAN Place 1 spray into both nostrils as needed for congestion.   torsemide 100 MG tablet Commonly known as:  DEMADEX Take 1 tablet (100 mg total) by mouth daily. Please take on Non dialysis days- Tues, Thurs, Saturday and sunday            Durable Medical Equipment  (From admission, onward)         Start     Ordered   01/15/18 1755  For home use only DME oxygen  Once    Question Answer Comment  Mode or (Route) Nasal cannula   Liters per Minute 5   Frequency Continuous (stationary and portable oxygen unit needed)   Oxygen delivery system Gas      10 /15/19 1754         Allergies  Allergen Reactions  . Acyclovir And Related   . Heparin Other (See Comments)    Excessive bleeding per pt.   . Ibuprofen Other (See Comments)    DUE TO DIALYSIS  . Multivitamin [Centrum] Other (See Comments)    DUE TO DIALYSIS  . Daypro [Oxaprozin] Swelling and Rash    Other reaction(s): Other (See Comments)  . Tape Rash   Discharge Instructions    Diet renal/carb modified with fluid restriction   Complete by:  As directed    Discharge instructions   Complete by:  As directed    It is important that you read following instructions as well as go over your medication list with RN to help you understand your care after this hospitalization.  Discharge Instructions: Please follow-up with PCP in one week  Please request your primary care physician to go over all Hospital Tests and Procedure/Radiological results at the follow up,  Please get all Hospital records sent to your PCP by signing hospital release before you go home.   Do not take more than prescribed Pain, Sleep and Anxiety Medications. You were cared for by a hospitalist during your hospital stay. If you have any questions about your discharge medications or the care you received while you were in the hospital after you are discharged, you can call  the unit and ask to speak with the hospitalist on call if the hospitalist that took care of you is not available.  Once you are discharged, your primary care physician will handle any further medical issues. Please note that NO REFILLS for any discharge medications will be authorized once you are discharged, as it is imperative that you return to your primary care physician (or establish a relationship with a primary care physician if you do not have one) for your aftercare needs so  that they can reassess your need for medications and monitor your lab values. You Must read complete instructions/literature along with all the possible adverse reactions/side effects for all the Medicines you take and that have been prescribed to you. Take any new Medicines after you have completely understood and accept all the possible adverse reactions/side effects. Wear Seat belts while driving. If you have smoked or chewed Tobacco in the last 2 yrs please stop smoking and/or stop any Recreational drug use.   Increase activity slowly   Complete by:  As directed      Discharge Exam: Filed Weights   01/14/18 1816 01/15/18 0840 01/15/18 1216  Weight: 89.1 kg 89.8 kg 86.5 kg   Vitals:   01/15/18 1324 01/15/18 1544  BP:  (!) 144/51  Pulse:  73  Resp:  14  Temp:  98.7 F (37.1 C)  SpO2: 99% 100%   General: Appear in no distress, no Rash; Oral Mucosamoist. Cardiovascular: S1 and S2 Present, no Murmur, no JVD Respiratory: Bilateral Air entry present and Occasional, no Crackles, no wheezes Abdomen: Bowel Sound present, Soft and no tenderness Extremities: no Pedal edema, no calf tenderness Neurology: Grossly no focal neuro deficit.  The results of significant diagnostics from this hospitalization (including imaging, microbiology, ancillary and laboratory) are listed below for reference.    Significant Diagnostic Studies: Dg Chest 2 View  Result Date: 12/24/2017 CLINICAL DATA:  Acute onset shortness of  breath. EXAM: CHEST - 2 VIEW COMPARISON:  12/18/2017 and 12/15/2017 FINDINGS: Lungs are adequately inflated demonstrate a small right-sided pleural effusion likely with associated right basilar atelectasis unchanged. Interval improved left base opacification. Cardiomediastinal silhouette and remainder of the exam is unchanged. IMPRESSION: Stable small right pleural effusion likely with associated right basilar atelectasis. Near resolution of left base opacification. Electronically Signed   By: Marin Olp M.D.   On: 12/24/2017 21:43   Ct Head Wo Contrast  Result Date: 12/31/2017 CLINICAL DATA:  Confusion, combative. Headache. On Eliquis. History of lymphoma, end-stage renal disease on dialysis, hypertension, stroke. EXAM: CT HEAD WITHOUT CONTRAST TECHNIQUE: Contiguous axial images were obtained from the base of the skull through the vertex without intravenous contrast. COMPARISON:  CT HEAD October 15, 2013 FINDINGS: BRAIN: No intraparenchymal hemorrhage, mass effect nor midline shift. The ventricles and sulci are normal for age. Minimal supratentorial white matter hypodensities less than expected for patient's age, though non-specific are most compatible with chronic small vessel ischemic disease. No acute large vascular territory infarcts. No abnormal extra-axial fluid collections. Basal cisterns are patent. VASCULAR: Moderate to severe calcific atherosclerosis of the carotid siphons. SKULL: No skull fracture. No significant scalp soft tissue swelling. SINUSES/ORBITS: Trace paranasal sinus mucosal thickening. Mastoid air cells are well aerated.The included ocular globes and orbital contents are non-suspicious. Status post bilateral ocular lens implants. OTHER: None. IMPRESSION: Normal noncontrast CT HEAD for age. Electronically Signed   By: Elon Alas M.D.   On: 12/31/2017 01:13   Ct Chest Wo Contrast  Result Date: 01/06/2018 CLINICAL DATA:  Inpatient.  Dyspnea.  History of bladder cancer. EXAM: CT  CHEST WITHOUT CONTRAST TECHNIQUE: Multidetector CT imaging of the chest was performed following the standard protocol without IV contrast. COMPARISON:  Chest radiograph from one day prior. 03/07/2016 chest CT. 11/30/2016 PET-CT. FINDINGS: Cardiovascular: Normal heart size. No significant pericardial effusion/thickening. Right internal jugular MediPort terminates at the cavoatrial junction. Left main and 3 vessel coronary atherosclerosis. Atherosclerotic nonaneurysmal thoracic aorta. Top-normal caliber main pulmonary artery (3.1 cm diameter). Mediastinum/Nodes: No discrete thyroid  nodules. Unremarkable esophagus. Mild bilateral axillary adenopathy measuring up to the 1.5 cm on the left (series 2/image 29), mildly decreased from 1.7 cm on 11/30/2016 PET-CT. Enlarged 2.6 cm left supraclavicular node (series 2/image 1), stable. Left prevascular adenopathy up to 1.5 cm (series 2/image 45), stable. Enlarged 2.3 cm AP window node (series 2/image 52), stable. Right paratracheal adenopathy up to the 2.1 cm (series 2/image 29), decreased from 2.6 cm. Enlarged 2.1 cm subcarinal node (series 2/image 70), stable. Mild bilateral hilar adenopathy is poorly delineated on these noncontrast images and is not definitely changed. Lungs/Pleura: No pneumothorax. Small to moderate right and small left dependent pleural effusions. Moderate right and mild left dependent lower lobe compressive atelectasis. There is extensive patchy peribronchovascular ground-glass opacity and centrilobular ground-glass micronodularity in both lungs with relative sparing of the lung bases. There is scattered mild interlobular septal thickening. No lung masses. Upper abdomen: Nonobstructing 3 mm upper left renal stone. Musculoskeletal: No aggressive appearing focal osseous lesions. Symmetric mild gynecomastia. Mild anasarca. Moderate thoracic spondylosis. IMPRESSION: 1. Small to moderate right and small left dependent pleural effusions. Mild anasarca.  Extensive ground-glass opacity and centrilobular ground-glass micronodularity in both lungs with mild interlobular septal thickening, favoring pulmonary edema. These findings suggest fluid overload in this patient with history of dialysis. 2. Extensive thoracic adenopathy in the bilateral axillary, bilateral mediastinal, bilateral hilar and left supraclavicular regions, stable to decreased since 11/30/2016 PET-CT. 3. Left main and 3 vessel coronary atherosclerosis. Aortic Atherosclerosis (ICD10-I70.0). Electronically Signed   By: Ilona Sorrel M.D.   On: 01/06/2018 11:34   Nm Pulmonary Perf And Vent  Result Date: 12/25/2017 CLINICAL DATA:  Chest pain. EXAM: NUCLEAR MEDICINE VENTILATION - PERFUSION LUNG SCAN TECHNIQUE: Ventilation images were obtained in multiple projections using inhaled aerosol Tc-82m DTPA. Perfusion images were obtained in multiple projections after intravenous injection of Tc-28m-MAA. RADIOPHARMACEUTICALS:  34.4 mCi of Tc-50m DTPA aerosol inhalation and 4.3 mCi Tc43m-MAA IV COMPARISON:  Chest x-ray 12/24/2017. FINDINGS: No ventilation perfusion mismatches noted. Mild ventilatory defects and perfusion defects are noted within the tori defects being larger. These are primarily in the bases and most likely related to previously identified basilar atelectasis and pleural effusions. IMPRESSION: Low probability pulmonary embolus. Electronically Signed   By: Marcello Moores  Register   On: 12/25/2017 11:58   US Venous Img Lower Bilateral  Result Date: 12/25/2017 CLINICAL DATA:  Bilateral lower extremity pain/cramping for 1 day with dyspnea. EXAM: BILATERAL LOWER EXTREMITY VENOUS DOPPLER ULTRASOUND TECHNIQUE: Gray-scale sonography with graded compression, as well as color Doppler and duplex ultrasound were performed to evaluate the lower extremity deep venous systems from the level of the common femoral vein and including the common femoral, femoral, profunda femoral, popliteal and calf veins including the  posterior tibial, peroneal and gastrocnemius veins when visible. The superficial great saphenous vein was also interrogated. Spectral Doppler was utilized to evaluate flow at rest and with distal augmentation maneuvers in the common femoral, femoral and popliteal veins. COMPARISON:  Left lower extremity venous Doppler scan dated 01/15/2017 FINDINGS: RIGHT LOWER EXTREMITY Common Femoral Vein: No evidence of thrombus. Normal compressibility, respiratory phasicity and response to augmentation. Saphenofemoral Junction: No evidence of thrombus. Normal compressibility and flow on color Doppler imaging. Profunda Femoral Vein: No evidence of thrombus. Normal compressibility and flow on color Doppler imaging. Femoral Vein: Nonocclusive thrombosis of the midportion of the right femoral vein. Patent compressible proximal and distal portions of the right femoral vein. Popliteal Vein: No evidence of thrombus. Normal compressibility, respiratory phasicity and response  to augmentation. Calf Veins: No evidence of thrombus. Normal compressibility and flow on color Doppler imaging. Superficial Great Saphenous Vein: No evidence of thrombus. Normal compressibility. Venous Reflux:  None. Other Findings:  None. LEFT LOWER EXTREMITY Common Femoral Vein: No evidence of thrombus. Normal compressibility, respiratory phasicity and response to augmentation. Saphenofemoral Junction: No evidence of thrombus. Normal compressibility and flow on color Doppler imaging. Profunda Femoral Vein: No evidence of thrombus. Normal compressibility and flow on color Doppler imaging. Femoral Vein: No evidence of thrombus. Normal compressibility, respiratory phasicity and response to augmentation. Popliteal Vein: No evidence of thrombus. Normal compressibility, respiratory phasicity and response to augmentation. Calf Veins: No evidence of thrombus. Normal compressibility and flow on color Doppler imaging. Superficial Great Saphenous Vein: No evidence of  thrombus. Normal compressibility. Venous Reflux:  None. Other Findings:  None. IMPRESSION: 1. Nonocclusive deep venous thrombosis of the midportion of the right femoral vein. 2. Otherwise patent deep veins of the lower extremities bilaterally. Electronically Signed   By: Ilona Sorrel M.D.   On: 12/25/2017 03:00   Dg Chest Portable 1 View  Result Date: 01/12/2018 CLINICAL DATA:  72 year old male with shortness of breath. EXAM: PORTABLE CHEST 1 VIEW COMPARISON:  None. FINDINGS: Lung volumes are low. Diffuse peribronchial cuffing. Small to moderate right pleural effusion. Opacities in the lung bases bilaterally which may reflect areas of atelectasis and/or consolidation. No evidence of pulmonary edema. Heart size is normal. Upper mediastinal contours are within normal limits. Aortic atherosclerosis. Right internal jugular single-lumen porta cath with tip terminating in the right atrium. IMPRESSION: 1. Diffuse peribronchial cuffing concerning for an acute bronchitis. 2. Small to moderate right pleural effusion. 3. Low lung volumes with bibasilar opacities which may reflect areas of atelectasis and/or airspace consolidation. Electronically Signed   By: Vinnie Langton M.D.   On: 01/12/2018 01:48   Dg Chest Port 1 View  Result Date: 01/05/2018 CLINICAL DATA:  Shortness of breath, respiratory failure EXAM: PORTABLE CHEST 1 VIEW COMPARISON:  01/04/2018 FINDINGS: Right Port-A-Cath remains in place, unchanged. Diffuse bilateral airspace opacities and small right effusion, stable since prior study. Heart is borderline in size. IMPRESSION: Stable diffuse bilateral airspace opacities and small right effusion. Electronically Signed   By: Rolm Baptise M.D.   On: 01/05/2018 08:25   Dg Chest Port 1 View  Result Date: 01/04/2018 CLINICAL DATA:  Short of breath on exertion EXAM: PORTABLE CHEST 1 VIEW COMPARISON:  12/30/2017 FINDINGS: Progressive bilateral airspace disease with basilar predominance. Progression of small  right effusion. Interval placement of Port-A-Cath with the tip in the right atrium. No pneumothorax Port-A-Cath tip in the right atrium Progressive bilateral airspace disease and small right effusion. IMPRESSION: No active disease. Electronically Signed   By: Franchot Gallo M.D.   On: 01/04/2018 11:10   Dg Chest Port 1 View  Result Date: 12/30/2017 CLINICAL DATA:  Weakness. EXAM: PORTABLE CHEST 1 VIEW COMPARISON:  Most recent radiograph 12/24/2017, 12/18/2017. Additional priors. FINDINGS: Low lung volumes. Increased pulmonary edema from prior exam. Small right pleural effusion is grossly unchanged. Streaky left lung base atelectasis. Mild cardiomegaly is similar to priors. No pneumothorax. IMPRESSION: Increased pulmonary edema from prior exam. Small right pleural effusion is similar. Electronically Signed   By: Keith Rake M.D.   On: 12/30/2017 23:50   Dg Chest Port 1 View  Result Date: 12/18/2017 CLINICAL DATA:  Shortness of breath. EXAM: PORTABLE CHEST 1 VIEW COMPARISON:  Radiograph of December 15, 2017. FINDINGS: The heart size and mediastinal contours are within normal  limits. Stable bilateral pulmonary edema is noted. Mild bilateral pleural effusions are noted which are increased compared to prior exam. No pneumothorax is noted. Atherosclerosis of thoracic aorta is noted. The visualized skeletal structures are unremarkable. IMPRESSION: Bilateral pulmonary edema with mild bilateral pleural effusions. Aortic Atherosclerosis (ICD10-I70.0). Electronically Signed   By: Marijo Conception, M.D.   On: 12/18/2017 15:10    Microbiology: Recent Results (from the past 240 hour(s))  Culture, blood (Routine x 2)     Status: None (Preliminary result)   Collection Time: 01/12/18  1:15 AM  Result Value Ref Range Status   Specimen Description BLOOD PORTA CATH  Final   Special Requests   Final    BOTTLES DRAWN AEROBIC AND ANAEROBIC Blood Culture adequate volume   Culture   Final    NO GROWTH 3  DAYS Performed at [facility], [address] [place], [place] [address]    Report Status PENDING  Incomplete  Urine Culture     Status: None   Collection Time: 01/12/18  1:30 AM  Result Value Ref Range Status   Specimen Description URINE, RANDOM  Final   Special Requests NONE  Final   Culture   Final    NO GROWTH Performed at [facility], [place] [address] [place], [place] [address]    Report Status 01/13/2018 FINAL  Final  Culture, blood (Routine x 2)     Status: None (Preliminary result)   Collection Time: 01/12/18  1:40 AM  Result Value Ref Range Status   Specimen Description BLOOD LEFT ARM  Final   Special Requests   Final    BOTTLES DRAWN AEROBIC AND ANAEROBIC Blood Culture results may not be optimal due to an excessive volume of blood received in culture bottles   Culture   Final    NO GROWTH 3 DAYS Performed at [facility], [place] [address], [place] [place], [place] [address]    Report Status PENDING  Incomplete  Respiratory Panel by PCR     Status: Abnormal   Collection Time: 01/13/18  4:04 PM  Result Value Ref Range Status   Adenovirus NOT DETECTED NOT DETECTED Final   Coronavirus 229E NOT DETECTED NOT DETECTED Final   Coronavirus HKU1 NOT DETECTED NOT DETECTED Final   Coronavirus NL63 NOT DETECTED NOT DETECTED Final   Coronavirus OC43 NOT DETECTED NOT DETECTED Final   Metapneumovirus NOT DETECTED NOT DETECTED Final   Rhinovirus / Enterovirus DETECTED (A) NOT DETECTED Final   Influenza A NOT DETECTED NOT DETECTED Final   Influenza A H1 NOT DETECTED NOT DETECTED Final   Influenza A H1 2009 NOT DETECTED NOT DETECTED Final   Influenza A H3 NOT DETECTED NOT DETECTED Final   Influenza B NOT DETECTED NOT DETECTED Final   Parainfluenza Virus 1 NOT DETECTED NOT DETECTED Final   Parainfluenza Virus 2 NOT DETECTED NOT DETECTED Final   Parainfluenza Virus 3 NOT DETECTED NOT DETECTED Final   Parainfluenza Virus 4 NOT DETECTED NOT DETECTED Final   Respiratory  Syncytial Virus NOT DETECTED NOT DETECTED Final   Bordetella pertussis NOT DETECTED NOT DETECTED Final   Chlamydophila pneumoniae NOT DETECTED NOT DETECTED Final   Mycoplasma pneumoniae NOT DETECTED NOT DETECTED Final    Comment: Performed at [facility], [place] [address], [place], [place] [address]     Labs: CBC: Recent Labs  Lab 01/12/18 0122 01/13/18 0340 01/14/18 0523 01/15/18 0923  WBC 3.4* 2.4* 4.0 3.7*  NEUTROABS 3.0  --   --   --  HGB 7.5* 7.0* 6.5* 8.8*  HCT 24.4* 22.6* 21.1* 28.1*  MCV 96.1 96.6 95.5 91.8  PLT 108* 86* 104* 72*   Basic Metabolic Panel: Recent Labs  Lab 01/12/18 0122 01/13/18 0340 01/13/18 2225 01/14/18 1427 01/15/18 0744  NA 137 137  --  138 139  K 4.4 4.7  --  4.8 3.6  CL 98 97*  --  97* 99  CO2 26 24  --  24 27  GLUCOSE 170* 371* 404* 314* 237*  BUN 42* 30*  --  67* 37*  CREATININE 4.28* 3.26*  --  6.02* 4.08*  CALCIUM 6.4* 6.8*  --  6.4* 7.0*  MG 1.8  --   --   --   --   PHOS 3.9  --   --  4.8* 3.6   Liver Function Tests: Recent Labs  Lab 01/12/18 0122 01/14/18 1427 01/15/18 0744  AST 19  --   --   ALT 20  --   --   ALKPHOS 56  --   --   BILITOT 1.0  --   --   PROT 5.2*  --   --   ALBUMIN 2.5* 2.5* 2.7*   No results for input(s): LIPASE, AMYLASE in the last 168 hours. No results for input(s): AMMONIA in the last 168 hours. Cardiac Enzymes: No results for input(s): CKTOTAL, CKMB, CKMBINDEX, TROPONINI in the last 168 hours. BNP (last 3 results) No results for input(s): BNP in the last 8760 hours. CBG: Recent Labs  Lab 01/14/18 1217 01/14/18 1928 01/14/18 2318 01/15/18 1213 01/15/18 1540  GLUCAP 171* 144* 262* 108* 241*   Time spent: 35 minutes  Signed:  Berle Mull  [facility] 01/15/2018   , 11:05 PM

## 2018-01-15 NOTE — Progress Notes (Signed)
[facility] Progress Note  Subjective: trouble breathing last night  Vitals:   01/15/18 1100 01/15/18 1130 01/15/18 1200 01/15/18 1216  BP: (!) 160/71 (!) 163/68 (!) 158/63 (!) 160/66  Pulse: 78 65 74 69  Resp:    18  Temp:    98 F (36.7 C)  TempSrc:    Oral  SpO2:    96%  Weight:    86.5 kg  Height:        Inpatient medications: . sodium chloride   Intravenous Once  . Chlorhexidine Gluconate Cloth  6 each Topical Q0600  . feeding supplement (ENSURE ENLIVE)  237 mL Oral Daily  . gabapentin  100 mg Oral TID  . insulin aspart  0-15 Units Subcutaneous TID WC  . insulin aspart  0-5 Units Subcutaneous QHS  . ipratropium-albuterol  3 mL Nebulization TID  . metoCLOPramide  5 mg Oral TID AC  . midodrine      . midodrine  10 mg Oral Q M,W,F-HD  . mometasone-formoterol  2 puff Inhalation BID  . multivitamin  1 tablet Oral QHS  . polyethylene glycol  17 g Oral Daily  . pravastatin  20 mg Oral q1800  . prednisoLONE acetate  1 drop Left Eye BID  . senna-docusate  1 tablet Oral BID  . torsemide  100 mg Oral Once per day on Sun Tue Thu Sat    albuterol, docusate sodium, fluticasone, polyvinyl alcohol, sodium chloride, sodium chloride flush  Iron/TIBC/Ferritin/ %Sat    Component Value Date/Time   IRON 29 (L) 01/25/2017 0844   IRON 78 01/31/2013 1134   TIBC 213 (L) 01/25/2017 0844   TIBC 265 01/31/2013 1134   FERRITIN 601 (H) 07-01-2016 1123   IRONPCTSAT 14 (L) 01/25/2017 0844   IRONPCTSAT 29 01/31/2013 1134    Exam: Gen nasal O2, ^wob  No rash, cyanosis or gangrene Sclera anicteric, throat clear  +jvd  Chest rales bibasilar RRR no MRG Abd soft ntnd no mass or ascites +bs GU normal male MS no joint effusions or deformity Ext diffuse 2+ pitting edema Neuro is alert, Ox 3, gen'd weak   CXR 10/12 am > bilat LL densities R > L atx vs consolidation, no gross edema   CT chest 10/6 - diffuse ground glass bilat, RLL consolidation/ air bronchograms   Home  meds:  - glimepiride 4 hs/ insulin aspart 4u tid/ insulin detemir 14 qd  - midodrine 10 mg prehd mwf  - albuterol prn/ budesonide-formoterol 2 bid/ ipratropium-albuterol qid nebs  - lovastatin 20 qd/ gabapentin 100 tid  - prns/ vitamins/ supplements/ creams   Dialysis: [facility] MWF [facility]  L AVF 90.5kg  Impression/ Plan: 1. SOB/ vol overload/ anasarca/ pulm edema - improving w/ serial HD, down 8kg. Take more off w hd today. See how he tolerates, is 4kg under dry going into HD today.  2. PNA - HCAP/ RLL, on IV vanc/ cefepime  3. ESRD on HD - [facility] in [place] MWF sched.  4. DM on insulin 5. Recent DVT LE - sp IVC filter, eliquis dc'd due to worsening anemia 6. H/o CVA 7. H/o bladder cancer 8. Debility - prob multifactorial, from SNF 9. Disposition - when enough vol overload has resolved.     Kelly Splinter MD [facility] pager [phone]   01/15/2018, 1:15 PM   Recent Labs  Lab 01/12/18 0122  01/14/18 1427 01/15/18 0744  NA 137   < > 138 139  K 4.4   < >  4.8 3.6  CL 98   < > 97* 99  CO2 26   < > 24 27  GLUCOSE 170*   < > 314* 237*  BUN 42*   < > 67* 37*  CREATININE 4.28*   < > 6.02* 4.08*  CALCIUM 6.4*   < > 6.4* 7.0*  PHOS 3.9  --  4.8* 3.6  ALBUMIN 2.5*  --  2.5* 2.7*  INR 1.41  --   --   --    < > = values in this interval not displayed.   Recent Labs  Lab 01/12/18 0122  AST 19  ALT 20  ALKPHOS 56  BILITOT 1.0  PROT 5.2*   Recent Labs  Lab 01/12/18 0122  01/14/18 0523 01/15/18 0923  WBC 3.4*   < > 4.0 3.7*  NEUTROABS 3.0  --   --   --   HGB 7.5*   < > 6.5* 8.8*  HCT 24.4*   < > 21.1* 28.1*  MCV 96.1   < > 95.5 91.8  PLT 108*   < > 104* 72*   < > = values in this interval not displayed.

## 2018-01-15 NOTE — Progress Notes (Signed)
Spoke with Joelene Millin PharmD in pharmacy regarding patients documented/reported heparin allergy. Joelene Millin PharmD reported that instilling Heparin into PORT-a-Cath would be "ok d/t the dosage and not system." Explained to patient and pt VU. Fran Lowes, RN VAST

## 2018-01-15 NOTE — Progress Notes (Signed)
SATURATION QUALIFICATIONS: (This note is used to comply with regulatory documentation for home oxygen)  Patient Saturations on Room Air at Rest 88%  Patient Saturations on Room Air while Ambulating 88%  Patient Saturations on 5 Liters of oxygen while Ambulating.95%  Please briefly explain why patient needs home oxygen: Pt is unable to maintain minimum level of required oxygen on room air.

## 2018-01-15 NOTE — Evaluation (Signed)
Physical Therapy Evaluation Patient Details Name: Mark [name]. MRN: [id number] DOB: [birth year] Today's Date: 01/15/2018   History of Present Illness  Patient is a 72 y/o male admitted with respiratory failure requiring Bipap and fever.  PMH positive for past medical history significant for recent pneumonia, ESRD on hemodialysis MWF; CVA, lymphoma, diabetes mellitus, hypertension, bladder cancer, anemia and irritable bowel syndrome  Clinical Impression  Patient presents with decreased independence with mobility due to weakness, decreased endurance and limited activity tolerance especially following dialysis.  Currently minguard to min a for OOB to chair with ambulation limited per patient since last hospital stay.  Feel he will benefit from skilled PT in the acute setting and follow up HHPT with 24 hour assist from family.      Follow Up Recommendations Home health PT;Supervision/Assistance - 24 hour    Equipment Recommendations  None recommended by PT    Recommendations for Other Services       Precautions / Restrictions Precautions Precautions: Fall Precaution Comments: oxygen dependent Required Braces or Orthoses: Other Brace/Splint(chronic R LE foot drop with articulating AFO)      Mobility  Bed Mobility Overal bed mobility: Needs Assistance Bed Mobility: Supine to Sit     Supine to sit: Min guard;HOB elevated     General bed mobility comments: assist for lines and safety  Transfers Overall transfer level: Needs assistance Equipment used: Rolling walker (2 wheeled) Transfers: Sit to/from [facility] Sit to Stand: Min assist;Min guard Stand pivot transfers: Min assist       General transfer comment: assist to stabilize as coming up from EOB, up from recliner with armrests minguard as well for safety  Ambulation/Gait             General Gait Details: deferred due to pt fatigued as just back from dialysis and awaiting to eat (no food  since breakfast)  Stairs            Wheelchair Mobility    Modified Rankin (Stroke Patients Only)       Balance Overall balance assessment: Needs assistance Sitting-balance support: No upper extremity supported Sitting balance-Leahy Scale: Good Sitting balance - Comments: seated to perform upper body bathing                                     Pertinent Vitals/Pain Pain Assessment: No/denies pain    Home Living Family/patient expects to be discharged to:: Private residence Living Arrangements: Spouse/significant other Available Help at Discharge: Family Type of Home: House Home Access: Ramped entrance     Home Layout: One level Home Equipment: Environmental consultant - 4 wheels;Wheelchair - Banker;Shower seat;Grab bars - tub/shower;Grab bars - toilet;Hand held shower head;Cane - quad;Cane - single point;Walker - 2 wheels;Bedside commode      Prior Function Level of Independence: Independent with assistive device(s)   Gait / Transfers Assistance Needed: Mod Ind amb with a rollator household distances, no fall history, uses a w/c for community access  ADL's / Homemaking Assistance Needed: wife has been assisting with lower body dressing        Hand Dominance   Dominant Hand: Right    Extremity/Trunk Assessment   Upper Extremity Assessment Upper Extremity Assessment: Overall WFL for tasks assessed    Lower Extremity Assessment Lower Extremity Assessment: Generalized weakness;RLE deficits/detail RLE Deficits / Details: Pt with chronic R foot drop.  RLE Sensation: history of peripheral neuropathy  Cervical / Trunk Assessment Cervical / Trunk Assessment: Kyphotic  Communication   Communication: No difficulties  Cognition Arousal/Alertness: Awake/alert Behavior During Therapy: WFL for tasks assessed/performed Overall Cognitive Status: Within Functional Limits for tasks assessed                                         General Comments General comments (skin integrity, edema, etc.): assist for back and perineal area for bath and assisted with bed change as soiled; discussed follow up PT needs and pt's preference for home with bad recent experience at [facility] ([place]    Exercises     Assessment/Plan    PT Assessment    PT Problem List Decreased strength;Decreased mobility;Decreased safety awareness;Decreased balance;Decreased knowledge of use of DME;Decreased activity tolerance;Cardiopulmonary status limiting activity       PT Treatment Interventions DME instruction;Therapeutic activities;Gait training;Therapeutic exercise;Patient/family education;Balance training;Functional mobility training    PT Goals (Current goals can be found in the Care Plan section)  Acute Rehab PT Goals Patient Stated Goal: To go home with home PT PT Goal Formulation: With patient Time For Goal Achievement: 01/29/18 Potential to Achieve Goals: Good    Frequency Min 3X/week   Barriers to discharge        Co-evaluation               AM-PAC PT "6 Clicks" Daily Activity  Outcome Measure Difficulty turning over in bed (including adjusting bedclothes, sheets and blankets)?: A Little Difficulty moving from lying on back to sitting on the side of the bed? : Unable Difficulty sitting down on and standing up from a chair with arms (e.g., wheelchair, bedside commode, etc,.)?: Unable Help needed moving to and from a bed to chair (including a wheelchair)?: A Little Help needed walking in hospital room?: A Little Help needed climbing 3-5 steps with a railing? : Total 6 Click Score: 12    End of Session Equipment Utilized During Treatment: Gait belt;Oxygen Activity Tolerance: Patient limited by fatigue Patient left: with call bell/phone within reach;in chair   PT Visit Diagnosis: Muscle weakness (generalized) (M62.81);Other abnormalities of gait and mobility (R26.89)    Time: 2376-2831 PT Time Calculation (min) (ACUTE ONLY): 30  min   Charges:   PT Evaluation $PT Eval Moderate Complexity: 1 Mod PT Treatments $Therapeutic Activity: 8-22 mins        Magda Kiel, [place] [facility] [phone] 01/15/2018   Reginia Naas 01/15/2018, 3:23 PM

## 2018-01-17 ENCOUNTER — Inpatient Hospital Stay
Admission: EM | Admit: 2018-01-17 | Discharge: 2018-01-20 | DRG: 640 | Disposition: A | Discharge status: Home Health | Payer: Medicare HMO | Attending: Internal Medicine | Admitting: Internal Medicine

## 2018-01-17 ENCOUNTER — Other Ambulatory Visit: Payer: Self-pay

## 2018-01-17 ENCOUNTER — Emergency Department: Payer: Medicare HMO

## 2018-01-17 DIAGNOSIS — J90 Pleural effusion, not elsewhere classified: Secondary | ICD-10-CM | POA: Diagnosis present

## 2018-01-17 DIAGNOSIS — J9601 Acute respiratory failure with hypoxia: Secondary | ICD-10-CM | POA: Diagnosis not present

## 2018-01-17 DIAGNOSIS — C9110 Chronic lymphocytic leukemia of B-cell type not having achieved remission: Secondary | ICD-10-CM | POA: Diagnosis present

## 2018-01-17 DIAGNOSIS — I959 Hypotension, unspecified: Secondary | ICD-10-CM | POA: Diagnosis present

## 2018-01-17 DIAGNOSIS — Z8673 Personal history of transient ischemic attack (TIA), and cerebral infarction without residual deficits: Secondary | ICD-10-CM

## 2018-01-17 DIAGNOSIS — I120 Hypertensive chronic kidney disease with stage 5 chronic kidney disease or end stage renal disease: Secondary | ICD-10-CM | POA: Diagnosis present

## 2018-01-17 DIAGNOSIS — I82401 Acute embolism and thrombosis of unspecified deep veins of right lower extremity: Secondary | ICD-10-CM | POA: Diagnosis present

## 2018-01-17 DIAGNOSIS — Z95828 Presence of other vascular implants and grafts: Secondary | ICD-10-CM

## 2018-01-17 DIAGNOSIS — Z9111 Patient's noncompliance with dietary regimen: Secondary | ICD-10-CM

## 2018-01-17 DIAGNOSIS — E8770 Fluid overload, unspecified: Secondary | ICD-10-CM | POA: Diagnosis present

## 2018-01-17 DIAGNOSIS — Z961 Presence of intraocular lens: Secondary | ICD-10-CM | POA: Diagnosis present

## 2018-01-17 DIAGNOSIS — N186 End stage renal disease: Secondary | ICD-10-CM | POA: Diagnosis present

## 2018-01-17 DIAGNOSIS — H9190 Unspecified hearing loss, unspecified ear: Secondary | ICD-10-CM | POA: Diagnosis present

## 2018-01-17 DIAGNOSIS — Z79899 Other long term (current) drug therapy: Secondary | ICD-10-CM

## 2018-01-17 DIAGNOSIS — E1122 Type 2 diabetes mellitus with diabetic chronic kidney disease: Secondary | ICD-10-CM | POA: Diagnosis present

## 2018-01-17 DIAGNOSIS — J9621 Acute and chronic respiratory failure with hypoxia: Secondary | ICD-10-CM | POA: Diagnosis present

## 2018-01-17 DIAGNOSIS — Z7951 Long term (current) use of inhaled steroids: Secondary | ICD-10-CM

## 2018-01-17 DIAGNOSIS — Z794 Long term (current) use of insulin: Secondary | ICD-10-CM

## 2018-01-17 DIAGNOSIS — Z8551 Personal history of malignant neoplasm of bladder: Secondary | ICD-10-CM

## 2018-01-17 DIAGNOSIS — E1140 Type 2 diabetes mellitus with diabetic neuropathy, unspecified: Secondary | ICD-10-CM | POA: Diagnosis present

## 2018-01-17 DIAGNOSIS — Z9841 Cataract extraction status, right eye: Secondary | ICD-10-CM

## 2018-01-17 DIAGNOSIS — K589 Irritable bowel syndrome without diarrhea: Secondary | ICD-10-CM | POA: Diagnosis present

## 2018-01-17 DIAGNOSIS — J189 Pneumonia, unspecified organism: Secondary | ICD-10-CM | POA: Diagnosis present

## 2018-01-17 DIAGNOSIS — K219 Gastro-esophageal reflux disease without esophagitis: Secondary | ICD-10-CM | POA: Diagnosis present

## 2018-01-17 DIAGNOSIS — E785 Hyperlipidemia, unspecified: Secondary | ICD-10-CM | POA: Diagnosis present

## 2018-01-17 DIAGNOSIS — J9691 Respiratory failure, unspecified with hypoxia: Secondary | ICD-10-CM | POA: Diagnosis present

## 2018-01-17 DIAGNOSIS — D696 Thrombocytopenia, unspecified: Secondary | ICD-10-CM | POA: Diagnosis present

## 2018-01-17 DIAGNOSIS — J440 Chronic obstructive pulmonary disease with acute lower respiratory infection: Secondary | ICD-10-CM | POA: Diagnosis present

## 2018-01-17 DIAGNOSIS — J810 Acute pulmonary edema: Secondary | ICD-10-CM | POA: Diagnosis present

## 2018-01-17 DIAGNOSIS — N2581 Secondary hyperparathyroidism of renal origin: Secondary | ICD-10-CM | POA: Diagnosis present

## 2018-01-17 DIAGNOSIS — Z7952 Long term (current) use of systemic steroids: Secondary | ICD-10-CM

## 2018-01-17 DIAGNOSIS — Z9842 Cataract extraction status, left eye: Secondary | ICD-10-CM | POA: Diagnosis not present

## 2018-01-17 DIAGNOSIS — Z992 Dependence on renal dialysis: Secondary | ICD-10-CM | POA: Diagnosis not present

## 2018-01-17 DIAGNOSIS — D631 Anemia in chronic kidney disease: Secondary | ICD-10-CM | POA: Diagnosis present

## 2018-01-17 DIAGNOSIS — Z91048 Other nonmedicinal substance allergy status: Secondary | ICD-10-CM

## 2018-01-17 DIAGNOSIS — R0602 Shortness of breath: Secondary | ICD-10-CM

## 2018-01-17 DIAGNOSIS — Z888 Allergy status to other drugs, medicaments and biological substances status: Secondary | ICD-10-CM

## 2018-01-17 DIAGNOSIS — M1990 Unspecified osteoarthritis, unspecified site: Secondary | ICD-10-CM | POA: Diagnosis present

## 2018-01-17 LAB — GLUCOSE, CAPILLARY
GLUCOSE-CAPILLARY: 135 mg/dL — AB (ref 70–99)
GLUCOSE-CAPILLARY: 151 mg/dL — AB (ref 70–99)
GLUCOSE-CAPILLARY: 149 mg/dL — AB (ref 70–99)
Glucose-Capillary: 394 mg/dL — ABNORMAL HIGH (ref 70–99)
Glucose-Capillary: 296 mg/dL — ABNORMAL HIGH (ref 70–99)

## 2018-01-17 LAB — BLOOD GAS, ARTERIAL
ACID-BASE EXCESS: 4.9 mmol/L — AB (ref 0.0–2.0)
Bicarbonate: 29.2 mmol/L — ABNORMAL HIGH (ref 20.0–28.0)
Delivery systems: POSITIVE
EXPIRATORY PAP: 5
FIO2: 0.4
Inspiratory PAP: 12
O2 SAT: 89.5 %
Patient temperature: 37
pCO2 arterial: 41 mmHg (ref 32.0–48.0)
pH, Arterial: 7.46 — ABNORMAL HIGH (ref 7.350–7.450)
pO2, Arterial: 54 mmHg — ABNORMAL LOW (ref 83.0–108.0)

## 2018-01-17 LAB — BASIC METABOLIC PANEL
Anion gap: 12 (ref 5–15)
BUN: 38 mg/dL — AB (ref 8–23)
CHLORIDE: 95 mmol/L — AB (ref 98–111)
CO2: 28 mmol/L (ref 22–32)
CREATININE: 4 mg/dL — AB (ref 0.61–1.24)
Calcium: 6.8 mg/dL — ABNORMAL LOW (ref 8.9–10.3)
GFR calc Af Amer: 16 mL/min — ABNORMAL LOW (ref >60)
GFR, EST NON AFRICAN AMERICAN: 14 mL/min — AB (ref >60)
Glucose, Bld: 475 mg/dL — ABNORMAL HIGH (ref 70–99)
Potassium: 3.7 mmol/L (ref 3.5–5.1)
SODIUM: 135 mmol/L (ref 135–145)

## 2018-01-17 LAB — CBC
HEMATOCRIT: 24.4 % — AB (ref 39.0–52.0)
Hemoglobin: 7.8 g/dL — ABNORMAL LOW (ref 13.0–17.0)
MCH: 29.3 pg (ref 26.0–34.0)
MCHC: 32 g/dL (ref 30.0–36.0)
MCV: 91.7 fL (ref 80.0–100.0)
Platelets: 51 10*3/uL — ABNORMAL LOW (ref 150–400)
RBC: 2.66 MIL/uL — ABNORMAL LOW (ref 4.22–5.81)
RDW: 16.7 % — ABNORMAL HIGH (ref 11.5–15.5)
WBC: 4 10*3/uL (ref 4.0–10.5)
nRBC: 0 % (ref 0.0–0.2)

## 2018-01-17 LAB — CULTURE, BLOOD (ROUTINE X 2)
Culture: NO GROWTH
Culture: NO GROWTH
Special Requests: ADEQUATE

## 2018-01-17 LAB — TROPONIN I
TROPONIN I: 0.06 ng/mL — AB (ref <0.03)
Troponin I: 0.09 ng/mL (ref <0.03)
Troponin I: 0.11 ng/mL (ref <0.03)

## 2018-01-17 MED ORDER — VANCOMYCIN HCL IN DEXTROSE 1-5 GM/200ML-% IV SOLN
1000.0000 mg | Freq: Once | INTRAVENOUS | Status: DC
  Filled 2018-01-17: qty 200

## 2018-01-17 MED ORDER — ONDANSETRON HCL 4 MG/2ML IJ SOLN: 4.0000 mg | Freq: Four times a day (QID) | INTRAMUSCULAR | Status: DC | PRN

## 2018-01-17 MED ORDER — LIDOCAINE HCL (PF) 1 % IJ SOLN
5.0000 mL | INTRAMUSCULAR | Status: DC | PRN
  Filled 2018-01-17: qty 5

## 2018-01-17 MED ORDER — INSULIN ASPART 100 UNIT/ML ~~LOC~~ SOLN: 0.0000 [IU] | Freq: Three times a day (TID) | SUBCUTANEOUS | Status: DC

## 2018-01-17 MED ORDER — FAMOTIDINE 20 MG PO TABS
20.0000 mg | ORAL_TABLET | Freq: Every day | ORAL | Status: DC
  Administered 2018-01-17 – 2018-01-20 (×4): 20 mg via ORAL
  Filled 2018-01-17 (×4): qty 1

## 2018-01-17 MED ORDER — ASPIRIN EC 81 MG PO TBEC
81.0000 mg | DELAYED_RELEASE_TABLET | Freq: Every day | ORAL | Status: DC
  Administered 2018-01-18 – 2018-01-20 (×3): 81 mg via ORAL
  Filled 2018-01-17 (×3): qty 1

## 2018-01-17 MED ORDER — INSULIN ASPART 100 UNIT/ML ~~LOC~~ SOLN: 0.0000 [IU] | Freq: Every day | SUBCUTANEOUS | Status: DC

## 2018-01-17 MED ORDER — ACETAMINOPHEN 650 MG RE SUPP: 650.0000 mg | Freq: Four times a day (QID) | RECTAL | Status: DC | PRN

## 2018-01-17 MED ORDER — HYDROCORTISONE NA SUCCINATE PF 100 MG IJ SOLR
25.0000 mg | Freq: Every day | INTRAMUSCULAR | Status: DC
  Administered 2018-01-18: 25 mg via INTRAVENOUS
  Filled 2018-01-17: qty 2

## 2018-01-17 MED ORDER — EPOETIN ALFA 10000 UNIT/ML IJ SOLN: 4000.0000 [IU] | Freq: Once | INTRAMUSCULAR | Status: DC

## 2018-01-17 MED ORDER — ALBUTEROL SULFATE (2.5 MG/3ML) 0.083% IN NEBU
2.5000 mg | INHALATION_SOLUTION | RESPIRATORY_TRACT | Status: DC | PRN
  Administered 2018-01-19 (×2): 2.5 mg via RESPIRATORY_TRACT
  Filled 2018-01-17 (×2): qty 3

## 2018-01-17 MED ORDER — SODIUM CHLORIDE 0.9 % IV SOLN
1.0000 g | Freq: Once | INTRAVENOUS | Status: DC
  Filled 2018-01-17: qty 1

## 2018-01-17 MED ORDER — MIDODRINE HCL 5 MG PO TABS
10.0000 mg | ORAL_TABLET | Freq: Three times a day (TID) | ORAL | Status: DC
  Administered 2018-01-17: 10 mg via ORAL
  Filled 2018-01-17 (×4): qty 2

## 2018-01-17 MED ORDER — FLUTICASONE FUROATE-VILANTEROL 100-25 MCG/INH IN AEPB
1.0000 | INHALATION_SPRAY | Freq: Every day | RESPIRATORY_TRACT | Status: DC
  Filled 2018-01-17: qty 28

## 2018-01-17 MED ORDER — ASPIRIN 325 MG PO TABS
325.0000 mg | ORAL_TABLET | Freq: Every day | ORAL | Status: DC
  Administered 2018-01-17: 325 mg via ORAL
  Filled 2018-01-17: qty 1

## 2018-01-17 MED ORDER — CHLORHEXIDINE GLUCONATE CLOTH 2 % EX PADS
6.0000 | MEDICATED_PAD | Freq: Every day | CUTANEOUS | Status: DC
  Administered 2018-01-17 – 2018-01-18 (×2): 6 via TOPICAL
  Filled 2018-01-17: qty 6

## 2018-01-17 MED ORDER — INSULIN ASPART 100 UNIT/ML ~~LOC~~ SOLN
0.0000 [IU] | Freq: Every day | SUBCUTANEOUS | Status: DC
  Administered 2018-01-18: 4 [IU] via SUBCUTANEOUS
  Administered 2018-01-19: 5 [IU] via SUBCUTANEOUS
  Filled 2018-01-17 (×2): qty 1

## 2018-01-17 MED ORDER — LIDOCAINE-PRILOCAINE 2.5-2.5 % EX CREA
1.0000 "application " | TOPICAL_CREAM | CUTANEOUS | Status: DC | PRN
  Filled 2018-01-17: qty 5

## 2018-01-17 MED ORDER — IPRATROPIUM-ALBUTEROL 0.5-2.5 (3) MG/3ML IN SOLN: 3.0000 mL | Freq: Four times a day (QID) | RESPIRATORY_TRACT | Status: DC | PRN

## 2018-01-17 MED ORDER — MORPHINE SULFATE (PF) 2 MG/ML IV SOLN
2.0000 mg | Freq: Once | INTRAVENOUS | Status: AC
  Administered 2018-01-17: 2 mg via INTRAVENOUS
  Filled 2018-01-17: qty 1

## 2018-01-17 MED ORDER — IPRATROPIUM-ALBUTEROL 0.5-2.5 (3) MG/3ML IN SOLN
3.0000 mL | Freq: Four times a day (QID) | RESPIRATORY_TRACT | Status: DC
  Administered 2018-01-17 – 2018-01-20 (×12): 3 mL via RESPIRATORY_TRACT
  Filled 2018-01-17 (×12): qty 3

## 2018-01-17 MED ORDER — VANCOMYCIN HCL IN DEXTROSE 1-5 GM/200ML-% IV SOLN
1000.0000 mg | INTRAVENOUS | Status: DC
  Filled 2018-01-17: qty 200

## 2018-01-17 MED ORDER — VANCOMYCIN HCL 10 G IV SOLR
2000.0000 mg | Freq: Once | INTRAVENOUS | Status: AC
  Administered 2018-01-17: 2000 mg via INTRAVENOUS
  Filled 2018-01-17: qty 2000

## 2018-01-17 MED ORDER — POLYETHYLENE GLYCOL 3350 17 G PO PACK
17.0000 g | PACK | Freq: Every day | ORAL | Status: DC | PRN
  Administered 2018-01-18: 17 g via ORAL
  Filled 2018-01-17: qty 1

## 2018-01-17 MED ORDER — ACETAMINOPHEN 325 MG PO TABS
650.0000 mg | ORAL_TABLET | Freq: Four times a day (QID) | ORAL | Status: DC | PRN
  Administered 2018-01-19: 650 mg via ORAL
  Filled 2018-01-17: qty 2

## 2018-01-17 MED ORDER — DEXTROSE 5 % IV SOLN: 500.0000 mg | INTRAVENOUS | Status: DC

## 2018-01-17 MED ORDER — INSULIN ASPART 100 UNIT/ML ~~LOC~~ SOLN
0.0000 [IU] | SUBCUTANEOUS | Status: DC
  Administered 2018-01-17: 8 [IU] via SUBCUTANEOUS
  Filled 2018-01-17: qty 1

## 2018-01-17 MED ORDER — SODIUM CHLORIDE 0.9 % IV SOLN: 100.0000 mL | INTRAVENOUS | Status: DC | PRN

## 2018-01-17 MED ORDER — ONDANSETRON HCL 4 MG/2ML IJ SOLN
4.0000 mg | Freq: Once | INTRAMUSCULAR | Status: AC
  Administered 2018-01-17: 4 mg via INTRAVENOUS
  Filled 2018-01-17: qty 2

## 2018-01-17 MED ORDER — PENTAFLUOROPROP-TETRAFLUOROETH EX AERO
1.0000 "application " | INHALATION_SPRAY | CUTANEOUS | Status: DC | PRN
  Filled 2018-01-17: qty 30

## 2018-01-17 MED ORDER — INSULIN ASPART 100 UNIT/ML ~~LOC~~ SOLN
0.0000 [IU] | Freq: Three times a day (TID) | SUBCUTANEOUS | Status: DC
  Administered 2018-01-18: 3 [IU] via SUBCUTANEOUS
  Administered 2018-01-19: 11 [IU] via SUBCUTANEOUS
  Administered 2018-01-19 – 2018-01-20 (×2): 8 [IU] via SUBCUTANEOUS
  Administered 2018-01-20: 5 [IU] via SUBCUTANEOUS
  Filled 2018-01-17 (×5): qty 1

## 2018-01-17 MED ORDER — EPOETIN ALFA 10000 UNIT/ML IJ SOLN
4000.0000 [IU] | Freq: Once | INTRAMUSCULAR | Status: AC
  Administered 2018-01-17: 4000 [IU] via INTRAVENOUS
  Filled 2018-01-17: qty 0.4

## 2018-01-17 MED ORDER — HYDROCORTISONE NA SUCCINATE PF 100 MG IJ SOLR
25.0000 mg | Freq: Two times a day (BID) | INTRAMUSCULAR | Status: DC
  Administered 2018-01-17: 25 mg via INTRAVENOUS
  Filled 2018-01-17: qty 2

## 2018-01-17 MED ORDER — INSULIN DETEMIR 100 UNIT/ML ~~LOC~~ SOLN
18.0000 [IU] | Freq: Every day | SUBCUTANEOUS | Status: DC
  Administered 2018-01-17: 18 [IU] via SUBCUTANEOUS
  Filled 2018-01-17 (×2): qty 0.18

## 2018-01-17 MED ORDER — ONDANSETRON HCL 4 MG PO TABS: 4.0000 mg | ORAL_TABLET | Freq: Four times a day (QID) | ORAL | Status: DC | PRN

## 2018-01-17 MED ORDER — INSULIN ASPART 100 UNIT/ML ~~LOC~~ SOLN: 0.0000 [IU] | SUBCUTANEOUS | Status: DC

## 2018-01-17 NOTE — Progress Notes (Signed)
Physical Therapy Evaluation Patient Details Name: Mark [name]. MRN: [id number] DOB: [birth year] Today's Date: 01/17/2018   History of Present Illness  Pt presented with c/c of chest tightness and increased SOB. On 01/17/18 admitted for respiratory failure with hypoxia. Pt's PMH includes ESRD on HD, chronic anemia, RLE DVT s/p IVC filter, HIT, HTN, and GERD. Imaging showed shallow inspiration with atelectasis in the lung bases, potential edema or pneumonia, and small R pleural effusion.  Clinical Impression  Pt is a 72 year old male who was admitted for respiratory failure with hypoxia. Pt is reluctant to PT, but agreeable. Pt performs bed mobility, transfers, and ambulation with CGA and RW for UE support. Pt also uses RLE articulating AFO d/t chronic foot drop. He displays good safety awareness during all mobility. D/t pt's fatigue after HD this date and meal arrival did not perform ther-ex. Will provide HEP at next PT session. Pt demonstrates deficits with LE strength, mobility, balance, and activity tolerance. Pt is not at his baseline. Would benefit from skilled PT to address above deficits and promote optimal return to PLOF.      Follow Up Recommendations Home health PT;Supervision/Assistance - 24 hour    Equipment Recommendations  None recommended by PT    Recommendations for Other Services       Precautions / Restrictions Precautions Precautions: Fall Precaution Comments: oxygen dependent Required Braces or Orthoses: Other Brace/Splint(Chronic RLE foot drop) Other Brace/Splint: articulating AFO Restrictions Weight Bearing Restrictions: No      Mobility  Bed Mobility Overal bed mobility: Needs Assistance Bed Mobility: Supine to Sit     Supine to sit: Min guard;HOB elevated     General bed mobility comments: assist for lines and safety  Transfers Overall transfer level: Needs assistance Equipment used: Rolling walker (2 wheeled) Transfers: Sit to/from  Stand Sit to Stand: Min guard         General transfer comment: Pt performs with safe body mechanics, requires increased time to perform.  Ambulation/Gait Ambulation/Gait assistance: Min guard Gait Distance (Feet): 3 Feet Assistive device: Rolling walker (2 wheeled) Gait Pattern/deviations: Step-to pattern;Trunk flexed;Decreased step length - right;Decreased step length - left Gait velocity: decreased   General Gait Details: Pt demonstrates appropriate use of RW, CGA for safety. Equal steps with fair foot clearance.  Stairs            Wheelchair Mobility    Modified Rankin (Stroke Patients Only)       Balance Overall balance assessment: Needs assistance Sitting-balance support: No upper extremity supported Sitting balance-Leahy Scale: Good     Standing balance support: Bilateral upper extremity supported;During functional activity Standing balance-Leahy Scale: Fair Standing balance comment: Heavy reliance on the RW for support in standing                             Pertinent Vitals/Pain Pain Assessment: 0-10 Pain Score: 4  Pain Location: L upper chest below nipple line Pain Descriptors / Indicators: Aching;Guarding Pain Intervention(s): Limited activity within patient's tolerance;Monitored during session    [place] expects to be discharged to:: Private residence Living Arrangements: Spouse/significant other(wife) Available Help at Discharge: Family Type of Home: House Home Access: Ramped entrance     Home Layout: One level Home Equipment: Environmental consultant - 4 wheels;Wheelchair - Banker;Shower seat;Grab bars - tub/shower;Grab bars - toilet;Hand held shower head;Cane - quad;Cane - single point;Walker - 2 wheels;Bedside commode      Prior Function  Level of Independence: Independent with assistive device(s)   Gait / Transfers Assistance Needed: Per pt uses manual wc for home and community mobility. Independently  transfers to and from wheelchair with sit-to-stand or stand-pivot transfer.  ADL's / Homemaking Assistance Needed: wife has been assisting with lower body dressing        Hand Dominance   Dominant Hand: Right    Extremity/Trunk Assessment   Upper Extremity Assessment Upper Extremity Assessment: Overall WFL for tasks assessed    Lower Extremity Assessment Lower Extremity Assessment: Generalized weakness(BLE 4/5) RLE Deficits / Details: Pt with chronic R foot drop.  RLE Sensation: history of peripheral neuropathy    Cervical / Trunk Assessment Cervical / Trunk Assessment: Kyphotic  Communication   Communication: No difficulties  Cognition Arousal/Alertness: Awake/alert Behavior During Therapy: WFL for tasks assessed/performed Overall Cognitive Status: Within Functional Limits for tasks assessed                                 General Comments: Follows 3-step commands without difficulty      General Comments      Exercises     Assessment/Plan    PT Assessment Patient needs continued PT services  PT Problem List Decreased strength;Decreased activity tolerance;Decreased balance;Decreased mobility;Cardiopulmonary status limiting activity;Pain       PT Treatment Interventions DME instruction;Gait training;Functional mobility training;Therapeutic activities;Therapeutic exercise;Balance training;Patient/family education    PT Goals (Current goals can be found in the Care Plan section)  Acute Rehab PT Goals Patient Stated Goal: To go home PT Goal Formulation: With patient Time For Goal Achievement: 01/31/18 Potential to Achieve Goals: Good Additional Goals Additional Goal #1: Pt will perform 5xSTS using UE for support prn in 30 seconds for improved strength and functional mobility.    Frequency Min 2X/week   Barriers to discharge        Co-evaluation               AM-PAC PT "6 Clicks" Daily Activity  Outcome Measure Difficulty turning  over in bed (including adjusting bedclothes, sheets and blankets)?: A Little Difficulty moving from lying on back to sitting on the side of the bed? : Unable Difficulty sitting down on and standing up from a chair with arms (e.g., wheelchair, bedside commode, etc,.)?: Unable Help needed moving to and from a bed to chair (including a wheelchair)?: A Little Help needed walking in hospital room?: A Little Help needed climbing 3-5 steps with a railing? : Total 6 Click Score: 12    End of Session Equipment Utilized During Treatment: Gait belt;Oxygen Activity Tolerance: Patient limited by fatigue Patient left: in bed;with call bell/phone within reach Nurse Communication: Mobility status PT Visit Diagnosis: Muscle weakness (generalized) (M62.81);Other abnormalities of gait and mobility (R26.89);Pain Pain - Right/Left: Left Pain - part of body: (Chest)    Time: 2248-2500 PT Time Calculation (min) (ACUTE ONLY): 23 min   Charges:              Algis Downs, SPT  01/17/2018, 5:44 PM

## 2018-01-17 NOTE — Progress Notes (Signed)
Waiting on pt to get admitted to ICU, RN will call me when pt gets out of CT and on ICU unit.    01/17/18 0920  Hand-Off documentation  Report given to (Full Name) Stark Bray  Report received from (Full Name) Hiral Patel,RN  Vital Signs  Pulse Rate 82  Resp 14  Oxygen Therapy  SpO2 100 %

## 2018-01-17 NOTE — H&P (Addendum)
[facility] NAME: Mark [name]    MR#:  [id number]  DATE OF BIRTH:  01/18/1946  DATE OF ADMISSION:  01/17/2018  PRIMARY CARE PHYSICIAN: Albina Billet, MD   REQUESTING/REFERRING PHYSICIAN: Dr brown  CHIEF COMPLAINT:  chest tightness and increasing shortness of breath  HISTORY OF PRESENT ILLNESS:  Mark [name]  is a 72 y.o. male with a known history of end-stage renal disease on hemodialysis, chronic anemia, right lower extremity DVT status post IVC filter, H IT, hypertension, Jerrye Bushy comes to the emergency room with chest tightness and increasing shortness of breath. Patient was found to be short of breath placed on BiPAP. Chest x-ray shows pulmonary edema. No fever. White count normal doubt pneumonia. Dr. Owens Shark discussed with Dr. Candiss Norse. Patient is going to get urgent hemodialysis. Patient also received a port on his right upper chest due to poor stick. Need to rule out PE if shortness of breath does not improve after origin dialysis and ultrafiltration  Patient has right lower extremity DVT. He underwent IVC filter secondary to H IT from heparin. He was started on eliquis however discontinued because of his worsening anemia.  PAST MEDICAL HISTORY:   Past Medical History:  Diagnosis Date  . Anemia   . Arthritis   . Bladder cancer ([place])   . Chronic kidney disease   . Diabetes mellitus without complication ([place])   . Dyspnea    DOE  . ESRD on hemodialysis [facility])    Started dialysis in 2014, gets HD as of 2019 in [place] w/ Dr Rolly Salter on MWF schedule.    [name] GERD (gastroesophageal reflux disease)   . HOH (hard of hearing)   . Hypertension   . IBS (irritable bowel syndrome)   . Lymphoma ([place]) 09/27/2014  . Neuropathy    RIGHT LEG  . Stroke [facility])    TIA    PAST SURGICAL HISTOIRY:   Past Surgical History:  Procedure Laterality Date  . BACK SURGERY  2007  . CATARACT EXTRACTION W/PHACO Left 03/23/2016   Procedure:  CATARACT EXTRACTION PHACO AND INTRAOCULAR LENS PLACEMENT (IOC);  Surgeon: Leandrew Koyanagi, MD;  Location: [facility] ORS;  Service: Ophthalmology;  Laterality: Left;  [place] 52.6 AP% 14.7 CDE 7.72 Fluid pack lot # [id number]  . CATARACT EXTRACTION W/PHACO Right 05/30/2016   Procedure: CATARACT EXTRACTION PHACO AND INTRAOCULAR LENS PLACEMENT ([place]);  Surgeon: Leandrew Koyanagi, MD;  Location: [facility] ORS;  Service: Ophthalmology;  Laterality: Right;  [place] 01:08 AP% 13.9 CDE 9.53  note: could not get IV in patient, so procedure done without anesthesia personell present, ok per Dr Wallace Going fluid pack lo t# [id number]  . CIRCUMCISION    . IVC FILTER INSERTION N/A 12/31/2017   Procedure: IVC FILTER INSERTION;  Surgeon: Algernon Huxley, MD;  Location: [place] CV LAB;  Service: Cardiovascular;  Laterality: N/A;  . PERIPHERAL VASCULAR CATHETERIZATION Left 08/19/2015   Procedure: A/V Shuntogram/Fistulagram;  Surgeon: Algernon Huxley, MD;  Location: [place] CV LAB;  Service: Cardiovascular;  Laterality: Left;  . PERIPHERAL VASCULAR CATHETERIZATION N/A 08/19/2015   Procedure: A/V Shunt Intervention;  Surgeon: Algernon Huxley, MD;  Location: [place] CV LAB;  Service: Cardiovascular;  Laterality: N/A;  . PORTA CATH INSERTION N/A 01/02/2018   Procedure: PORTA CATH INSERTION;  Surgeon: Katha Cabal, MD;  Location: [place] CV LAB;  Service: Cardiovascular;  Laterality: N/A;    SOCIAL HISTORY:   Social History   Tobacco Use  .  Smoking status: Former Smoker    Types: Cigarettes    Last attempt to quit: 05/19/2013    Years since quitting: 4.6  . Smokeless tobacco: Never Used  . Tobacco comment: quit  Substance Use Topics  . Alcohol use: No    Alcohol/week: 0.0 standard drinks    FAMILY HISTORY:   Family History  Problem Relation Age of Onset  . Cancer Mother   . Kidney cancer Neg Hx   . Prostate cancer Neg Hx   . Kidney failure Neg Hx   . Bladder Cancer Neg Hx     DRUG ALLERGIES:    Allergies  Allergen Reactions  . Acyclovir And Related   . Heparin Other (See Comments)    Excessive bleeding per pt.   . Ibuprofen Other (See Comments)    DUE TO DIALYSIS  . Multivitamin [Centrum] Other (See Comments)    DUE TO DIALYSIS  . Daypro [Oxaprozin] Swelling and Rash    Other reaction(s): Other (See Comments)  . Tape Rash    REVIEW OF SYSTEMS:  Review of Systems  Constitutional: Negative for chills, fever and weight loss.  HENT: Negative for ear discharge, ear pain and nosebleeds.   Eyes: Negative for blurred vision, pain and discharge.  Respiratory: Positive for shortness of breath. Negative for sputum production, wheezing and stridor.   Cardiovascular: Positive for chest pain. Negative for palpitations, orthopnea and PND.  Gastrointestinal: Negative for abdominal pain, diarrhea, nausea and vomiting.  Genitourinary: Negative for frequency and urgency.  Musculoskeletal: Negative for back pain and joint pain.  Neurological: Positive for weakness. Negative for sensory change, speech change and focal weakness.  Psychiatric/Behavioral: Negative for depression and hallucinations. The patient is not nervous/anxious.      MEDICATIONS AT HOME:   Prior to Admission medications   Medication Sig Start Date End Date Taking? Authorizing Provider  albuterol (PROVENTIL) (2.5 MG/3ML) 0.083% nebulizer solution Take 2.5 mg by nebulization every 6 (six) hours as needed for wheezing or shortness of breath.    [provider]  budesonide-formoterol (SYMBICORT) 160-4.5 MCG/ACT inhaler Inhale 2 puffs into the lungs 2 (two) times daily. 12/27/17   Gladstone Lighter, MD  Difluprednate (DUREZOL) 0.05 % EMUL Place 1 drop into the left eye 2 (two) times daily.     [provider]  docusate sodium (COLACE) 100 MG capsule Take 100 mg by mouth daily as needed for mild constipation.    [provider]  ENSURE PLUS (ENSURE PLUS) LIQD Take 1 Can by mouth daily.  01/26/14    [provider]  fluticasone (FLONASE) 50 MCG/ACT nasal spray Place 1 spray into both nostrils daily as needed for allergies.  12/14/14   [provider]  gabapentin (NEURONTIN) 100 MG capsule Take 1 capsule (100 mg total) by mouth 3 (three) times daily. 03/02/16   Edrick Kins, DPM  glimepiride (AMARYL) 4 MG tablet Take 4 mg by mouth at bedtime. Prn glucose levels 10/06/14   [provider]  guaiFENesin (MUCINEX) 600 MG 12 hr tablet Take 1 tablet (600 mg total) by mouth 2 (two) times daily. 01/08/18   Dustin Flock, MD  guaiFENesin-dextromethorphan (ROBITUSSIN DM) 100-10 MG/5ML syrup Take 15 mLs by mouth every 4 (four) hours as needed for cough. 01/08/18   Dustin Flock, MD  insulin aspart (NOVOLOG) 100 UNIT/ML injection Inject 4 Units into the skin 3 (three) times daily with meals. 01/08/18   Dustin Flock, MD  insulin detemir (LEVEMIR) 100 UNIT/ML injection Inject 0.14 mLs (14 Units  total) into the skin daily. 01/09/18   Dustin Flock, MD  ipratropium-albuterol (DUONEB) 0.5-2.5 (3) MG/3ML SOLN Take 3 mLs by nebulization 4 (four) times daily. 01/08/18   Dustin Flock, MD  lovastatin (MEVACOR) 20 MG tablet Take 1 tablet by mouth daily. 12/25/16   [provider]  metoCLOPramide (REGLAN) 5 MG tablet Take 5 mg by mouth 3 (three) times daily before meals. Patient takes 1 tab with each meal.    [provider]  midodrine (PROAMATINE) 10 MG tablet Take 1 tablet (10 mg total) by mouth every dialysis (MWF). 01/08/18   Dustin Flock, MD  multivitamin (RENA-VIT) TABS tablet Take 1 tablet by mouth at bedtime.     [provider]  nystatin cream (MYCOSTATIN) Apply 1 application topically 2 (two) times daily as needed (irritation).     [provider]  polyethylene glycol (MIRALAX / GLYCOLAX) packet Take 17 g by mouth daily. 01/16/18   Lavina Hamman, MD  Polyvinyl Alcohol-Povidone PF (REFRESH) 1.4-0.6 % SOLN Apply 1 drop to eye 3 (three) times  daily as needed (Dry eyes).    [provider]  predniSONE (STERAPRED UNI-PAK 21 TAB) 10 MG (21) TBPK tablet Start at 60mg  taper by 10mg  until complete 01/08/18   Dustin Flock, MD  silver sulfADIAZINE (SILVADENE) 1 % cream APPLY 1 APPLICATION TOPICALY DAILY Patient taking differently: Apply 1 application topically daily as needed (sores).  04/12/17   Edrick Kins, DPM  sodium chloride (OCEAN) 0.65 % SOLN nasal spray Place 1 spray into both nostrils as needed for congestion. 01/08/18   Dustin Flock, MD  torsemide (DEMADEX) 100 MG tablet Take 1 tablet (100 mg total) by mouth daily. Please take on Non dialysis days- Tues, Thurs, Saturday and sunday 12/28/17   Gladstone Lighter, MD      VITAL SIGNS:  Blood pressure (!) 132/50, pulse 77, resp. rate (!) 21, height 5\' 11"  (1.803 m), weight 86.5 kg, SpO2 98 %.  PHYSICAL EXAMINATION:  GENERAL:  72 y.o.-year-old patient lying in the bed with  acute distress. Currently on BiPAP EYES: Pupils equal, round, reactive to light and accommodation. No scleral icterus. Extraocular muscles intact.  HEENT: Head atraumatic, normocephalic. Oropharynx and nasopharynx clear.  NECK:  Supple, no jugular venous distention. No thyroid enlargement, no tenderness.  LUNGS: Normal breath sounds bilaterally, no wheezing, rales,rhonchi or crepitation. No use of accessory muscles of respiration.  CARDIOVASCULAR: S1, S2 normal. No murmurs, rubs, or gallops.  ABDOMEN: Soft, nontender, nondistended. Bowel sounds present. No organomegaly or mass.  EXTREMITIES: No pedal edema, cyanosis, or clubbing. Right lower extremity brace present NEUROLOGIC: Cranial nerves II through XII are intact. Muscle strength 5/5 in all extremities. Sensation intact. Gait not checked.  PSYCHIATRIC: The patient is alert and oriented x 3.  SKIN: No obvious rash, lesion, or ulcer.   LABORATORY PANEL:   CBC Recent Labs  Lab 01/17/18 0630  WBC 4.0  HGB 7.8*  HCT 24.4*  PLT 51*    ------------------------------------------------------------------------------------------------------------------  Chemistries  Recent Labs  Lab 01/12/18 0122  01/17/18 0630  NA 137   < > 135  K 4.4   < > 3.7  CL 98   < > 95*  CO2 26   < > 28  GLUCOSE 170*   < > 475*  BUN 42*   < > 38*  CREATININE 4.28*   < > 4.00*  CALCIUM 6.4*   < > 6.8*  MG 1.8  --   --   AST 19  --   --  ALT 20  --   --   ALKPHOS 56  --   --   BILITOT 1.0  --   --    < > = values in this interval not displayed.   ------------------------------------------------------------------------------------------------------------------  Cardiac Enzymes Recent Labs  Lab 01/17/18 0630  TROPONINI 0.09*   ------------------------------------------------------------------------------------------------------------------  RADIOLOGY:  Dg Chest Port 1 View  Result Date: 01/17/2018 CLINICAL DATA:  Left-sided chest pain. EXAM: PORTABLE CHEST 1 VIEW COMPARISON:  CT 01/06/2018. Chest 01/05/2018. FINDINGS: Shallow inspiration with linear atelectasis in the lung bases. Increased opacity in the left lung may represent asymmetrical edema or pneumonia. Small right pleural effusion, improving since previous study. No pneumothorax. Power port type central venous catheter with tip over the cavoatrial junction region. Calcification of the aorta. IMPRESSION: Shallow inspiration with atelectasis in the lung bases. Increased opacity in the left lung may represent asymmetrical edema or pneumonia. Small right pleural effusion, improving since previous study. Electronically Signed   By: Lucienne Capers M.D.   On: 01/17/2018 06:53    EKG:    IMPRESSION AND PLAN:   Mark [name]  is a 72 y.o. male with a known history of end-stage renal disease on hemodialysis, chronic anemia, right lower extremity DVT status post IVC filter, H IT, hypertension, Jerrye Bushy comes to the emergency room with chest tightness and increasing shortness of  breath. Patient was found to be short of breath placed on BiPAP  1. acute on chronic hypoxic respiratory failure secondary to flash pulmonary edema/volume overload -admit under ICU step down -discussed with Dr. Manuella Ghazi ICU attending. Recommends getting ABG. -Continue BiPAP wean as tolerated -Dr. Candiss Norse aware of patient being admitted. Urgent hemodialysis. -Consider BQ scan versus CT chest for PE protocol if patient symptoms do not improve after urgent hemodialysis and ultrafiltration. This was discussed with Dr. Candiss Norse [facility] aware patient has not been on any anticoagulation given H IT and anemia.  2. recent right lower extremity DVT -patient has IVC filter placed. He was on eliquis but discontinue due to worsening anemia. Does not have any active bleeding. -Patient has history of H IT  3. Type II diabetes sliding scale insulin for now resume Levemir  4. Hypertension continue home meds  5. History of bladder cancer and CLL  6. Hyperlipidemia on pravastatin  it is critically ill. Code status discussed with patient in presence of wife and daughter. Patient wants to be a partial code. No mechanical ventilator.  Palliative care has seen patient in the past. Most form needs to be filled out.   All the records are reviewed and case discussed with ED provider. Management plans discussed with the patient, family and they are in agreement.  CODE STATUS: limited code  TOTAL critical TIME TAKING CARE OF THIS PATIENT: *50* minutes.    Fritzi Mandes M.D on 01/17/2018 at 8:15 AM  Between 7am to 6pm - Pager - [phone]  After 6pm go to [email or web address] - password [email or web address]  [facility]  Office  [phone]  CC: Primary care physician; Albina Billet, MD

## 2018-01-17 NOTE — Progress Notes (Signed)
Pre HD assessment    01/17/18 1011  Neurological  Level of Consciousness Alert  Orientation Level Oriented X4  Respiratory  Respiratory Pattern Dyspnea at rest;Dyspnea with exertion  Chest Assessment Chest expansion symmetrical  Cardiac  ECG Monitor Yes  Cardiac Rhythm NSR  Vascular  R Radial Pulse +2  L Radial Pulse +2  Edema Generalized  Integumentary  Integumentary (WDL) X  Skin Color Appropriate for ethnicity  Musculoskeletal  Musculoskeletal (WDL) X  Generalized Weakness Yes  Assistive Device None  GU Assessment  Genitourinary (WDL) X  Genitourinary Symptoms  (HD)  Psychosocial  Psychosocial (WDL) WDL

## 2018-01-17 NOTE — Consult Note (Signed)
Pharmacy Antibiotic Note  Mark [name]. is a 72 y.o. male admitted on 01/17/2018 with sepsis.  Pharmacy has been consulted for vancomycin and cefepime dosing. PMH includes chronic anemia, right lower extremity DVT status post IVC filter 12/31/17, HIT, HTN. Patient normally gets HD on MWF but required an urgent HD session today where he became hypotensive and febrile. Blood cultures are being drawn.  Plan: 1) Will order Vancomycin 2g LD. Will order vancomycin 1g IV to be given with HD on MWF.   Will order Vancomycin level prior to 3rd HD. Pre-HD target level 15-25 mcg/mL.  2) cefepime 1000mg  once then 500mg  every 24 hours  Height: 5\' 11"  (180.3 cm) Weight: 188 lb 0.8 oz (85.3 kg) IBW/kg (Calculated) : 75.3  Temp (24hrs), Avg:98.9 F (37.2 C), Min:98.3 F (36.8 C), Max:99.5 F (37.5 C)  Recent Labs  Lab 01/12/18 0122 01/12/18 0125 01/13/18 0340 01/14/18 0523 01/14/18 1427 01/15/18 0744 01/15/18 0923 01/17/18 0630  WBC 3.4*  --  2.4* 4.0  --   --  3.7* 4.0  CREATININE 4.28*  --  3.26*  --  6.02* 4.08*  --  4.00*  LATICACIDVEN  --  1.66  --   --   --   --   --   --     Estimated Creatinine Clearance: 17.8 mL/min (A) (by C-G formula based on SCr of 4 mg/dL (H)).    Allergies  Allergen Reactions  . Acyclovir And Related   . Heparin Other (See Comments)    Excessive bleeding per pt.   . Ibuprofen Other (See Comments)    DUE TO DIALYSIS  . Multivitamin [Centrum] Other (See Comments)    DUE TO DIALYSIS  . Daypro [Oxaprozin] Swelling and Rash    Other reaction(s): Other (See Comments)  . Tape Rash    Antimicrobials this admission: vancomycin 10/17 >>  cefepime 10/17 >>   Microbiology results: 10/17 BCx: pending  Thank you for allowing pharmacy to be a part of this patient's care.  Dallie Piles, PharmD 01/17/2018 12:19 PM

## 2018-01-17 NOTE — ED Provider Notes (Signed)
[facility] Emergency Department Provider Note   None    (approximate)  I have reviewed the triage vital signs and the nursing notes.   HISTORY  Chief Complaint No chief complaint on file.    HPI Mark J [name]. is a 72 y.o. male with below list of chronic medical conditions including recently diagnosed right lower extremity DVT status post IVC filter placement recently placed mediport placed 2 weeks ago presents to the emergency department via EMS with acute onset of left-sided chest pain with accompanying dyspnea which began this morning.  Patient patient states that he was scheduled to have an additional dialysis treatment today emergency his usual dialysis days Monday Wednesday Friday).  Patient states that this appointment was scheduled secondary to "fluid on his lung".  Patient also states that he is currently being treated for pneumonia patient states current pain score is 7 out of 10.    Past Medical History:  Diagnosis Date  . Anemia   . Arthritis   . Bladder cancer ([place])   . Chronic kidney disease   . Diabetes mellitus without complication ([place])   . Dyspnea    DOE  . ESRD on hemodialysis [facility])    Started dialysis in 2014, gets HD as of 2019 in [place] w/ Dr Rolly Salter on MWF schedule.    [name] GERD (gastroesophageal reflux disease)   . HOH (hard of hearing)   . Hypertension   . IBS (irritable bowel syndrome)   . Lymphoma ([place]) 09/27/2014  . Neuropathy    RIGHT LEG  . Stroke [facility])    TIA    Patient Active Problem List   Diagnosis Date Noted  . Respiratory failure with hypoxia ([place]) 01/17/2018  . Acute respiratory failure with hypoxia ([place]) 01/12/2018  . Acute respiratory failure ([place]) 01/12/2018  . Symptomatic anemia 12/31/2017  . Acute on chronic respiratory failure with hypoxemia ([place]) 12/31/2017  . Hypoxia 12/25/2017  . Pressure injury of skin 12/16/2017  . Fluid overload 01/15/2017  . UTI (urinary tract infection) 10/20/2016    . Sepsis ([place]) 10/20/2016  . 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 Ulcer of foot, chronic ([place]) 03/21/2015  . History of bladder cancer 11/10/2014  . Penile bleeding 10/12/2014  . Dependence on renal dialysis ([place]) 08/18/2013  . Arterial blood pressure decreased 08/18/2013  . Leukemia ([place]) 08/18/2013  . Retina disorder 08/18/2013  . Breath shortness 08/18/2013  . End-stage renal disease on hemodialysis ([place]) 12/23/2012  . Absolute anemia 07/18/2012  . HPTH (hyperparathyroidism) ([place]) 07/18/2012  . Acidosis, metabolic 31/54/0086  . Hyperparathyroidism ([place]) 07/18/2012  . Abnormal presence of protein in urine 01/01/2012  . HLD (hyperlipidemia) 12/25/2011  . Diabetic retinopathy associated with type 2 diabetes mellitus ([place]) 10/26/1999  . Essential (primary) hypertension 04/04/1999  . Diabetes mellitus type 2, uncontrolled ([place]) 04/04/1999  . Type 2 diabetes mellitus with other diabetic kidney complication ([place]) 76/19/5093    Past Surgical History:  Procedure Laterality Date  . BACK SURGERY  2007  . CATARACT EXTRACTION W/PHACO Left 03/23/2016   Procedure: CATARACT EXTRACTION PHACO AND INTRAOCULAR LENS PLACEMENT (IOC);  Surgeon: Leandrew Koyanagi, MD;  Location: [facility] ORS;  Service: Ophthalmology;  Laterality: Left;  [place] 52.6 AP% 14.7 CDE 7.72 Fluid pack lot # [id number]  . CATARACT EXTRACTION W/PHACO Right 05/30/2016   Procedure: CATARACT EXTRACTION PHACO AND INTRAOCULAR LENS PLACEMENT ([place]);  Surgeon: Leandrew Koyanagi, MD;  Location: [facility] ORS;  Service:  Ophthalmology;  Laterality: Right;  [place] 01:08 AP% 13.9 CDE 9.53  note: could not get IV in patient, so procedure done without anesthesia personell present, ok per Dr Wallace Going fluid pack lo t# [id number]  . CIRCUMCISION    . IVC FILTER INSERTION N/A 12/31/2017   Procedure: IVC FILTER INSERTION;  Surgeon: Algernon Huxley, MD;  Location: [place] CV  LAB;  Service: Cardiovascular;  Laterality: N/A;  . PERIPHERAL VASCULAR CATHETERIZATION Left 08/19/2015   Procedure: A/V Shuntogram/Fistulagram;  Surgeon: Algernon Huxley, MD;  Location: [place] CV LAB;  Service: Cardiovascular;  Laterality: Left;  . PERIPHERAL VASCULAR CATHETERIZATION N/A 08/19/2015   Procedure: A/V Shunt Intervention;  Surgeon: Algernon Huxley, MD;  Location: [place] CV LAB;  Service: Cardiovascular;  Laterality: N/A;  . PORTA CATH INSERTION N/A 01/02/2018   Procedure: PORTA CATH INSERTION;  Surgeon: Katha Cabal, MD;  Location: [place] CV LAB;  Service: Cardiovascular;  Laterality: N/A;    Prior to Admission medications   Medication Sig Start Date End Date Taking? Authorizing Provider  albuterol (PROVENTIL) (2.5 MG/3ML) 0.083% nebulizer solution Take 2.5 mg by nebulization every 6 (six) hours as needed for wheezing or shortness of breath.   Yes [provider]  Difluprednate (DUREZOL) 0.05 % EMUL Place 1 drop into the left eye 2 (two) times daily.    Yes [provider]  docusate sodium (COLACE) 100 MG capsule Take 100 mg by mouth daily as needed for mild constipation.   Yes [provider]  ENSURE PLUS (ENSURE PLUS) LIQD Take 1 Can by mouth daily.  01/26/14  Yes [provider]  fluticasone (FLONASE) 50 MCG/ACT nasal spray Place 1 spray into both nostrils daily as needed for allergies.  12/14/14  Yes [provider]  gabapentin (NEURONTIN) 100 MG capsule Take 1 capsule (100 mg total) by mouth 3 (three) times daily. Patient taking differently: Take 100 mg by mouth 2 (two) times daily.  03/02/16  Yes Edrick Kins, DPM  glimepiride (AMARYL) 4 MG tablet Take 4 mg by mouth at bedtime. Prn glucose levels 10/06/14  Yes [provider]  guaiFENesin (MUCINEX) 600 MG 12 hr tablet Take 1 tablet (600 mg total) by mouth 2 (two) times daily. 01/08/18  Yes Dustin Flock, MD  guaiFENesin-dextromethorphan (ROBITUSSIN DM)  100-10 MG/5ML syrup Take 15 mLs by mouth every 4 (four) hours as needed for cough. 01/08/18  Yes Dustin Flock, MD  insulin detemir (LEVEMIR) 100 UNIT/ML injection Inject 0.14 mLs (14 Units total) into the skin daily. 01/09/18  Yes Dustin Flock, MD  ipratropium-albuterol (DUONEB) 0.5-2.5 (3) MG/3ML SOLN Take 3 mLs by nebulization 4 (four) times daily. 01/08/18  Yes Dustin Flock, MD  lovastatin (MEVACOR) 20 MG tablet Take 1 tablet by mouth daily. 12/25/16  Yes [provider]  metoCLOPramide (REGLAN) 5 MG tablet Take 5 mg by mouth 3 (three) times daily before meals. Patient takes 1 tab with each meal.   Yes [provider]  midodrine (PROAMATINE) 10 MG tablet Take 1 tablet (10 mg total) by mouth every dialysis (MWF). 01/08/18  Yes Dustin Flock, MD  multivitamin (RENA-VIT) TABS tablet Take 1 tablet by mouth at bedtime.    Yes [provider]  nystatin cream (MYCOSTATIN) Apply 1 application topically 2 (two) times daily as needed (irritation).    Yes [provider]  polyethylene glycol (MIRALAX / GLYCOLAX) packet Take 17 g by mouth daily. 01/16/18  Yes Lavina Hamman, MD  Polyvinyl Alcohol-Povidone PF [facility])  1.4-0.6 % SOLN Apply 1 drop to eye 3 (three) times daily as needed (Dry eyes).   Yes [provider]  silver sulfADIAZINE (SILVADENE) 1 % cream APPLY 1 APPLICATION TOPICALY DAILY Patient taking differently: Apply 1 application topically daily as needed (sores).  04/12/17  Yes Edrick Kins, DPM  sodium chloride (OCEAN) 0.65 % SOLN nasal spray Place 1 spray into both nostrils as needed for congestion. 01/08/18  Yes Dustin Flock, MD  torsemide (DEMADEX) 100 MG tablet Take 1 tablet (100 mg total) by mouth daily. Please take on Non dialysis days- Tues, Thurs, Saturday and sunday 12/28/17  Yes Gladstone Lighter, MD  budesonide-formoterol [facility]) 160-4.5 MCG/ACT inhaler Inhale 2 puffs into the lungs 2 (two) times daily. Patient not taking:  Reported on 01/17/2018 12/27/17   Gladstone Lighter, MD  insulin aspart (NOVOLOG) 100 UNIT/ML injection Inject 4 Units into the skin 3 (three) times daily with meals. Patient not taking: Reported on 01/17/2018 01/08/18   Dustin Flock, MD  predniSONE (STERAPRED UNI-PAK 21 TAB) 10 MG (21) TBPK tablet Start at 60mg  taper by 10mg  until complete Patient not taking: Reported on 01/17/2018 01/08/18   Dustin Flock, MD    Allergies Acyclovir and related; Heparin; Ibuprofen; Multivitamin [centrum]; Daypro [oxaprozin]; and Tape  Family History  Problem Relation Age of Onset  . Cancer Mother   . Kidney cancer Neg Hx   . Prostate cancer Neg Hx   . Kidney failure Neg Hx   . Bladder Cancer Neg Hx     Social History Social History   Tobacco Use  . Smoking status: Former Smoker    Types: Cigarettes    Last attempt to quit: 05/19/2013    Years since quitting: 4.6  . Smokeless tobacco: Never Used  . Tobacco comment: quit  Substance Use Topics  . Alcohol use: No    Alcohol/week: 0.0 standard drinks  . Drug use: No    Review of Systems Constitutional: No fever/chills Eyes: No visual changes. ENT: No sore throat. Cardiovascular: Positive for chest pain. Respiratory: Positive for shortness of breath. Gastrointestinal: No abdominal pain.  No nausea, no vomiting.  No diarrhea.  No constipation. Genitourinary: Negative for dysuria. Musculoskeletal: Negative for neck pain.  Negative for back pain. Integumentary: Negative for rash. Neurological: Negative for headaches, focal weakness or numbness.   ____________________________________________   PHYSICAL EXAM:  VITAL SIGNS: ED Triage Vitals  Enc Vitals Group     BP 01/17/18 0615 (!) 168/114     Pulse Rate 01/17/18 0615 97     Resp 01/17/18 0615 (!) 28     Temp --      Temp src --      SpO2 01/17/18 0615 (!) 84 %     Weight 01/17/18 0623 86.5 kg (190 lb 11.2 oz)     Height 01/17/18 0623 1.803 m (5\' 11" )     Head Circumference --       Peak Flow --      Pain Score 01/17/18 0623 7     Pain Loc --      Pain Edu? --      Excl. in [place]? --     Constitutional: Alert and oriented.  Apparent discomfort Eyes: Conjunctivae are normal. Mouth/Throat: Mucous membranes are moist. Oropharynx non-erythematous. Neck: No stridor.  Cardiovascular: Normal rate, regular rhythm. Good peripheral circulation. Grossly normal heart sounds. Respiratory: Tachypnea, positive accessory respiratory muscle use bibasilar rhonchi Gastrointestinal: Soft and nontender. No distention.  Musculoskeletal: No lower extremity tenderness nor edema.  No gross deformities of extremities. Neurologic:  Normal speech and language. No gross focal neurologic deficits are appreciated.  Skin:  Skin is warm, dry and intact. No rash noted. Psychiatric: Mood and affect are normal. Speech and behavior are normal.  ____________________________________________   LABS (all labs ordered are listed, but only abnormal results are displayed)  Labs Reviewed  BASIC METABOLIC PANEL - Abnormal; Notable for the following components:      Result Value   Chloride 95 (*)    Glucose, Bld 475 (*)    BUN 38 (*)    Creatinine, Ser 4.00 (*)    Calcium 6.8 (*)    GFR calc non Af Amer 14 (*)    GFR calc Af Amer 16 (*)    All other components within normal limits  CBC - Abnormal; Notable for the following components:   RBC 2.66 (*)    Hemoglobin 7.8 (*)    HCT 24.4 (*)    RDW 16.7 (*)    Platelets 51 (*)    All other components within normal limits  TROPONIN I - Abnormal; Notable for the following components:   Troponin I 0.09 (*)    All other components within normal limits  BLOOD GAS, ARTERIAL - Abnormal; Notable for the following components:   pH, Arterial 7.46 (*)    pO2, Arterial 54 (*)    Bicarbonate 29.2 (*)    Acid-Base Excess 4.9 (*)    All other components within normal limits  GLUCOSE, CAPILLARY - Abnormal; Notable for the following components:    Glucose-Capillary 394 (*)    All other components within normal limits  TROPONIN I - Abnormal; Notable for the following components:   Troponin I 0.11 (*)    All other components within normal limits  TROPONIN I - Abnormal; Notable for the following components:   Troponin I 0.06 (*)    All other components within normal limits  GLUCOSE, CAPILLARY - Abnormal; Notable for the following components:   Glucose-Capillary 151 (*)    All other components within normal limits  GLUCOSE, CAPILLARY - Abnormal; Notable for the following components:   Glucose-Capillary 135 (*)    All other components within normal limits  GLUCOSE, CAPILLARY - Abnormal; Notable for the following components:   Glucose-Capillary 296 (*)    All other components within normal limits  GLUCOSE, CAPILLARY - Abnormal; Notable for the following components:   Glucose-Capillary 149 (*)    All other components within normal limits  CULTURE, BLOOD (ROUTINE X 2)  CULTURE, BLOOD (ROUTINE X 2)  CBC WITH DIFFERENTIAL/PLATELET  COMPREHENSIVE METABOLIC PANEL  TROPONIN I  TROPONIN I  TROPONIN I   ____________________________________________  EKG  ED ECG REPORT I, [place] N Raini Tiley, the attending physician, personally viewed and interpreted this ECG.   Date: 01/17/2018  EKG Time: 6:14 AM  Rate: 97  Rhythm: Normal sinus rhythm  Axis: Normal  Intervals: Normal   ST&T Change: None  ____________________________________________  RADIOLOGY I, [place] N Izabell Schalk, personally viewed and evaluated these images (plain radiographs) as part of my medical decision making, as well as reviewing the written report by the radiologist.  ED MD interpretation:    Official radiology report(s): CLINICAL DATA:  Left-sided chest pain.  EXAM: PORTABLE CHEST 1 VIEW  COMPARISON:  CT 01/06/2018. Chest 01/05/2018.  FINDINGS: Shallow inspiration with linear atelectasis in the lung bases. Increased opacity in the left lung may represent  asymmetrical edema or pneumonia. Small right pleural effusion, improving since previous study. No  pneumothorax. Power port type central venous catheter with tip over the cavoatrial junction region. Calcification of the aorta.  IMPRESSION: Shallow inspiration with atelectasis in the lung bases. Increased opacity in the left lung may represent asymmetrical edema or pneumonia. Small right pleural effusion, improving since previous study.   Electronically Signed   By: Lucienne Capers M.D.   On: 01/17/2018 06:53  ____________________________________________   PROCEDURES  Critical Care performed:   .Critical Care Performed by: Gregor Hams, MD Authorized by: Gregor Hams, MD   Critical care provider statement:    Critical care time (minutes):  45   Critical care time was exclusive of:  Separately billable procedures and treating other patients   Critical care was necessary to treat or prevent imminent or life-threatening deterioration of the following conditions:  Respiratory failure   Critical care was time spent personally by me on the following activities:  Development of treatment plan with patient or surrogate, discussions with consultants, evaluation of patient's response to treatment, examination of patient, obtaining history from patient or surrogate, ordering and performing treatments and interventions, ordering and review of laboratory studies, ordering and review of radiographic studies, pulse oximetry, re-evaluation of patient's condition and review of old charts   I assumed direction of critical care for this patient from another provider in my specialty: no       ____________________________________________   INITIAL IMPRESSION / ASSESSMENT AND PLAN / ED COURSE  As part of my medical decision making, I reviewed the following data within the electronic MEDICAL RECORD NUMBER   72 year old male presenting with above-stated history and physical exam secondary  to respiratory distress.  Patient tachypneic with rales on exam concern for possible pulmonary edema and as such BiPAP was applied on arrival.  Also consider the possibility of pulmonary emboli given recent Mediport placement.  Patient discussed with Dr. Juleen China nephrology for emergent dialysis which will be performed.  I discussed the possibility of pulmonary emboli with Dr. Juleen China who recommended performing CT angiogram after dialysis was performed.  On reevaluation patient's respiratory status much improved on BiPAP.    ____________________________________________  FINAL CLINICAL IMPRESSION(S) / ED DIAGNOSES  Final diagnoses:  Acute pulmonary edema ([place])     MEDICATIONS GIVEN DURING THIS VISIT:  Medications  acetaminophen (TYLENOL) tablet 650 mg (has no administration in time range)    Or  acetaminophen (TYLENOL) suppository 650 mg (has no administration in time range)  polyethylene glycol (MIRALAX / GLYCOLAX) packet 17 g (has no administration in time range)  ondansetron (ZOFRAN) tablet 4 mg (has no administration in time range)    Or  ondansetron (ZOFRAN) injection 4 mg (has no administration in time range)  albuterol (PROVENTIL) (2.5 MG/3ML) 0.083% nebulizer solution 2.5 mg (has no administration in time range)  Chlorhexidine Gluconate Cloth 2 % PADS 6 each (6 each Topical Given 01/17/18 1000)  pentafluoroprop-tetrafluoroeth (GEBAUERS) aerosol 1 application (has no administration in time range)  lidocaine (PF) (XYLOCAINE) 1 % injection 5 mL (has no administration in time range)  lidocaine-prilocaine (EMLA) cream 1 application (has no administration in time range)  0.9 %  sodium chloride infusion (has no administration in time range)  0.9 %  sodium chloride infusion (has no administration in time range)  midodrine (PROAMATINE) tablet 10 mg (10 mg Oral Not Given 01/17/18 1627)  famotidine (PEPCID) tablet 20 mg (20 mg Oral Given 01/17/18 1151)  insulin detemir (LEVEMIR) injection  18 Units (18 Units Subcutaneous Given 01/17/18 1200)  aspirin tablet 325 mg (325  mg Oral Given 01/17/18 1151)  aspirin EC tablet 81 mg (has no administration in time range)  ipratropium-albuterol (DUONEB) 0.5-2.5 (3) MG/3ML nebulizer solution 3 mL (3 mLs Nebulization Given 01/18/18 0203)  vancomycin (VANCOCIN) IVPB 1000 mg/200 mL premix (has no administration in time range)  hydrocortisone sodium succinate (SOLU-CORTEF) 100 MG injection 25 mg (has no administration in time range)  insulin aspart (novoLOG) injection 0-15 Units (has no administration in time range)  insulin aspart (novoLOG) injection 0-5 Units (0 Units Subcutaneous Not Given 01/17/18 2057)  morphine 2 MG/ML injection 2 mg (2 mg Intravenous Given 01/17/18 0645)  ondansetron (ZOFRAN) injection 4 mg (4 mg Intravenous Given 01/17/18 0645)  epoetin alfa (EPOGEN,PROCRIT) injection 4,000 Units (4,000 Units Intravenous Given 01/17/18 1144)  vancomycin (VANCOCIN) 2,000 mg in sodium chloride 0.9 % 500 mL IVPB ( Intravenous Stopped 01/17/18 1727)     ED Discharge Orders    None       Note:  This document was prepared using Dragon voice recognition software and may include unintentional dictation errors.    Gregor Hams, MD 01/18/18 [phone]

## 2018-01-17 NOTE — Progress Notes (Signed)
Inpatient Diabetes Program Recommendations  AACE/ADA: New Consensus Statement on Inpatient Glycemic Control (2019)  Target Ranges:  Prepandial:   less than 140 mg/dL      Peak postprandial:   less than 180 mg/dL (1-2 hours)      Critically ill patients:  140 - 180 mg/dL   Results for Mark [name], Mark [name] (MRN [id number]) as of 01/17/2018 10:12  Ref. Range 01/17/2018 09:48  Glucose-Capillary Latest Ref Range: 70 - 99 mg/dL 394 (H)  Results for Mark [name], Mark [name] (MRN [id number]) as of 01/17/2018 10:12  Ref. Range 01/17/2018 06:30  Glucose Latest Ref Range: 70 - 99 mg/dL 475 (H)  Results for Mark [name], Mark [name] (MRN [id number]) as of 01/17/2018 10:12  Ref. Range 01/14/2018 03:53  Hemoglobin A1C Latest Ref Range: 4.8 - 5.6 % 6.5 (H)   Review of Glycemic Control  Diabetes history: DM2 Outpatient Diabetes medications: Amaryl 4 mg QHS, Levemir 14 units daily Current orders for Inpatient glycemic control: Novolog 0-9 units TID with meals  Inpatient Diabetes Program Recommendations:  Insulin - Basal: Noted patient will get emergent hemodialysis. If glucose continues to be consistently greater than 180 mg/dl, please consider ordering Levemir 5 units Q24H. Correction (SSI): Please consider adding Novolog 0-5 units QHS for bedtime correction.  Thanks, Barnie Alderman, RN, MSN, CDE Diabetes Coordinator Inpatient Diabetes Program [phone] (Team Pager from 8am to 5pm)

## 2018-01-17 NOTE — Progress Notes (Signed)
[facility], [place] 01/17/18  Subjective:   Patient known to our practice from outpatient dialysis as well as previous admissions.  He returns for acute shortness of breath.  He was recently at [facility] in [place] and was dialyzed for fluid overload.  8 kg were remove over several days.  An IVC filter was placed for DVT.  He had some bleeding issues from the operative site while he was on Eliquis therefore it was stopped. Today, patient also reports chest pain which was relieved with morphine.  He is currently on Nitropaste.  Objective:  Vital signs in last 24 hours:  Pulse Rate:  [77-97] 77 (10/17 0845) Resp:  [18-28] 20 (10/17 0845) BP: (126-168)/(39-114) 126/49 (10/17 0845) SpO2:  [84 %-99 %] 96 % (10/17 0845) Weight:  [86.5 kg] 86.5 kg (10/17 0623)  Weight change:  Filed Weights   01/17/18 0623  Weight: 86.5 kg    Intake/Output:   No intake or output data in the 24 hours ending 01/17/18 0906   Physical Exam: General:  Ill-appearing, laying in the bed  HEENT  BiPAP mask in place, left conjunctival redness  Neck  supple  Pulm/lungs  mild bilateral crackles, decreased breath sounds at bases  CVS/Heart  no rub  Abdomen:   Soft, nontender, nondistended  Extremities:  Right leg in brace, trace to 1+ edema  Neurologic:  Alert, able to follow commands  Skin:  No acute rashes  Access: Left forearm AVF       Basic Metabolic Panel:  Recent Labs  Lab 01/12/18 0122 01/13/18 0340 01/13/18 2225 01/14/18 1427 01/15/18 0744 01/17/18 0630  NA 137 137  --  138 139 135  K 4.4 4.7  --  4.8 3.6 3.7  CL 98 97*  --  97* 99 95*  CO2 26 24  --  24 27 28   GLUCOSE 170* 371* 404* 314* 237* 475*  BUN 42* 30*  --  67* 37* 38*  CREATININE 4.28* 3.26*  --  6.02* 4.08* 4.00*  CALCIUM 6.4* 6.8*  --  6.4* 7.0* 6.8*  MG 1.8  --   --   --   --   --   PHOS 3.9  --   --  4.8* 3.6  --      CBC: Recent Labs  Lab 01/12/18 0122 01/13/18 0340  01/14/18 0523 01/15/18 0923 01/17/18 0630  WBC 3.4* 2.4* 4.0 3.7* 4.0  NEUTROABS 3.0  --   --   --   --   HGB 7.5* 7.0* 6.5* 8.8* 7.8*  HCT 24.4* 22.6* 21.1* 28.1* 24.4*  MCV 96.1 96.6 95.5 91.8 91.7  PLT 108* 86* 104* 72* 51*      Lab Results  Component Value Date   HEPBSAG Negative 12/31/2017   HEPBIGM Negative 08/17/2016      Microbiology:  Recent Results (from the past 240 hour(s))  Culture, blood (Routine x 2)     Status: None   Collection Time: 01/12/18  1:15 AM  Result Value Ref Range Status   Specimen Description BLOOD PORTA CATH  Final   Special Requests   Final    BOTTLES DRAWN AEROBIC AND ANAEROBIC Blood Culture adequate volume   Culture   Final    NO GROWTH 5 DAYS Performed at [facility], [address] [place], [place] [address]    Report Status 01/17/2018 FINAL  Final  Urine Culture     Status: None   Collection Time: 01/12/18  1:30  AM  Result Value Ref Range Status   Specimen Description URINE, RANDOM  Final   Special Requests NONE  Final   Culture   Final    NO GROWTH Performed at [facility], [address], [place], [place] [address]    Report Status 01/13/2018 FINAL  Final  Culture, blood (Routine x 2)     Status: None   Collection Time: 01/12/18  1:40 AM  Result Value Ref Range Status   Specimen Description BLOOD LEFT ARM  Final   Special Requests   Final    BOTTLES DRAWN AEROBIC AND ANAEROBIC Blood Culture results may not be optimal due to an excessive volume of blood received in culture bottles   Culture   Final    NO GROWTH 5 DAYS Performed at [facility], [place] [address] [place], [place] [address]    Report Status 01/17/2018 FINAL  Final  Respiratory Panel by PCR     Status: Abnormal   Collection Time: 01/13/18  4:04 PM  Result Value Ref Range Status   Adenovirus NOT DETECTED NOT DETECTED Final   Coronavirus 229E NOT DETECTED NOT DETECTED Final   Coronavirus HKU1 NOT DETECTED NOT DETECTED Final   Coronavirus  NL63 NOT DETECTED NOT DETECTED Final   Coronavirus OC43 NOT DETECTED NOT DETECTED Final   Metapneumovirus NOT DETECTED NOT DETECTED Final   Rhinovirus / Enterovirus DETECTED (A) NOT DETECTED Final   Influenza A NOT DETECTED NOT DETECTED Final   Influenza A H1 NOT DETECTED NOT DETECTED Final   Influenza A H1 2009 NOT DETECTED NOT DETECTED Final   Influenza A H3 NOT DETECTED NOT DETECTED Final   Influenza B NOT DETECTED NOT DETECTED Final   Parainfluenza Virus 1 NOT DETECTED NOT DETECTED Final   Parainfluenza Virus 2 NOT DETECTED NOT DETECTED Final   Parainfluenza Virus 3 NOT DETECTED NOT DETECTED Final   Parainfluenza Virus 4 NOT DETECTED NOT DETECTED Final   Respiratory Syncytial Virus NOT DETECTED NOT DETECTED Final   Bordetella pertussis NOT DETECTED NOT DETECTED Final   Chlamydophila pneumoniae NOT DETECTED NOT DETECTED Final   Mycoplasma pneumoniae NOT DETECTED NOT DETECTED Final    Comment: Performed at [facility], [place] [address], [place], [place] [address]    Coagulation Studies: No results for input(s): LABPROT, INR in the last 72 hours.  Urinalysis: No results for input(s): COLORURINE, LABSPEC, PHURINE, GLUCOSEU, HGBUR, BILIRUBINUR, KETONESUR, PROTEINUR, UROBILINOGEN, NITRITE, LEUKOCYTESUR in the last 72 hours.  Invalid input(s): APPERANCEUR    Imaging: Dg Chest Port 1 View  Result Date: 01/17/2018 CLINICAL DATA:  Left-sided chest pain. EXAM: PORTABLE CHEST 1 VIEW COMPARISON:  CT 01/06/2018. Chest 01/05/2018. FINDINGS: Shallow inspiration with linear atelectasis in the lung bases. Increased opacity in the left lung may represent asymmetrical edema or pneumonia. Small right pleural effusion, improving since previous study. No pneumothorax. Power port type central venous catheter with tip over the cavoatrial junction region. Calcification of the aorta. IMPRESSION: Shallow inspiration with atelectasis in the lung bases. Increased opacity in the left lung may represent  asymmetrical edema or pneumonia. Small right pleural effusion, improving since previous study. Electronically Signed   By: Lucienne Capers M.D.   On: 01/17/2018 06:53     Medications:    . insulin aspart  0-9 Units Subcutaneous TID WC     Assessment/ Plan:  72 y.o. male with end stage renal disease on hemodialysis, CLL, diabetes mellitus type II, COPD, history of bladder cancer who is  admitted to [facility] on 01/17/2018 for shortness of breath.   [facility] MWF [facility] AVF 85.5kg  1.  End-stage renal disease Urgent hemodialysis requested today for volume overload and acute pulmonary edema  2.  Anemia of chronic kidney disease Hemoglobin 7.8. We will continue Epogen with routine hemodialysis on Monday Wednesday Friday  3.  Acute pulmonary edema Requiring BiPAP Urgent hemodialysis for volume removal  4.  Right leg DVT Patient had GI bleed previous admission.  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  He was taken off of Eliquis recently because of bleeding issues.  He had IVC filter on 12/31/2017    LOS: 0 Mark [name] 10/17/20199:06 AM  [place], [place]  Note: This note was prepared with Dragon dictation. Any transcription errors are unintentional

## 2018-01-17 NOTE — ED Notes (Signed)
Accessed right chest port (20 gauge 1 inch)- Mark [name]

## 2018-01-17 NOTE — Progress Notes (Signed)
HD tx end    01/17/18 1331  Vital Signs  Pulse Rate 83  Pulse Rate Source Monitor  Resp 20  BP (!) 111/51  BP Location Right Arm  BP Method Automatic  Patient Position (if appropriate) Lying  Oxygen Therapy  SpO2 98 %  O2 Device Nasal Cannula  O2 Flow Rate (L/min) 4 L/min  During Hemodialysis Assessment  Dialysis Fluid Bolus Normal Saline  Bolus Amount (mL) 250 mL  Intra-Hemodialysis Comments Tx completed

## 2018-01-17 NOTE — Progress Notes (Signed)
Post HD assessment    01/17/18 1339  Neurological  Level of Consciousness Alert  Orientation Level Oriented X4  Respiratory  Respiratory Pattern Dyspnea at rest;Dyspnea with exertion  Chest Assessment Chest expansion symmetrical  Cardiac  ECG Monitor Yes  Cardiac Rhythm NSR  Vascular  R Radial Pulse +2  L Radial Pulse +2  Edema Generalized  Integumentary  Integumentary (WDL) X  Skin Color Appropriate for ethnicity  Musculoskeletal  Musculoskeletal (WDL) X  Generalized Weakness Yes  Assistive Device None  GU Assessment  Genitourinary (WDL) X  Genitourinary Symptoms  (HD)  Psychosocial  Psychosocial (WDL) WDL

## 2018-01-17 NOTE — Progress Notes (Signed)
Pre HD assessment   01/17/18 1010  Vital Signs  Temp 99.5 F (37.5 C)  Temp Source Axillary  Pulse Rate 75  Pulse Rate Source Monitor  Resp 14  BP (!) 121/47  BP Location Right Arm  BP Method Automatic  Patient Position (if appropriate) Lying  Oxygen Therapy  SpO2 99 %  O2 Device Bi-PAP  Pain Assessment  Pain Scale 0-10  Pain Score 4  Pain Type Acute pain  Pain Location Chest  Pain Orientation Left  Pain Radiating Towards  (pain does not radiate )  Pain Descriptors / Indicators Sharp  Pain Onset On-going  Patients Stated Pain Goal 0  Pain Intervention(s) RN made aware  Dialysis Weight  Weight 85.3 kg  Type of Weight Pre-Dialysis  Time-Out for Hemodialysis  What Procedure? HD  Pt Identifiers(min of two) First/Last Name;MRN/Account#  Correct Site? Yes  Correct Side? Yes  Correct Procedure? Yes  Consents Verified? Yes  Rad Studies Available? N/A  Safety Precautions Reviewed? Yes  Engineer, civil (consulting) Number  (3A)  Station Number  (ICU 19 Bedside )  UF/Alarm Test Passed  Conductivity: Meter 13.6  Conductivity: Machine  13.5  pH 7.6  Reverse Osmosis  (WRO #4)  Normal Saline Lot Number [id number]  Dialyzer Lot Number [id number]  Disposable Set Lot Number [id number]  Machine Temperature 98.6 F (37 C)  Musician and Audible Yes  Blood Lines Intact and Secured Yes  Pre Treatment Patient Checks  Vascular access used during treatment Fistula  Hepatitis B Surface Antigen Results Negative  Date Hepatitis B Surface Antigen Drawn 12/31/17  Hepatitis B Surface Antibody  (<10)  Date Hepatitis B Surface Antibody Drawn 12/31/17  Hemodialysis Consent Verified Yes  Hemodialysis Standing Orders Initiated Yes  ECG (Telemetry) Monitor On Yes  Prime Ordered Normal Saline  Length of  DialysisTreatment -hour(s) 3 Hour(s)  Dialyzer Elisio 17H NR  Dialysate 3K, 2.5 Ca  Dialysis Anticoagulant None  Dialysate Flow Ordered 800  Blood Flow Rate Ordered 400 mL/min   Ultrafiltration Goal 2 Liters  Dialysis Blood Pressure Support Ordered Normal Saline  Education / Care Plan  Dialysis Education Provided Yes  Documented Education in Care Plan Yes  Fistula / Graft Left Forearm Arteriovenous fistula  No Placement Date or Time found.   Placed prior to admission: No  Orientation: Left  Access Location: Forearm  Access Type: Arteriovenous fistula  Site Condition No complications  Fistula / Graft Assessment Present;Thrill;Bruit  Drainage Description None

## 2018-01-17 NOTE — ED Triage Notes (Signed)
Pt arrived from home via EMS with complaints of left sided chest pain. Pt is alert and oriented x 4. Pt is a dialysis (Monday, wed, Friday) pt and was suppose to go in today for a "special treatment" because he has "fluid on his lungs". EMS stated that his 12 Lead was unremarkable. Pt has bed sore. Pt has a port in right side that was placed 2 weeks ago. Pt reports having a clot in his right leg. EMS gave 324 mg of ASA. Pt currently has pneumonia. Pt states a 7/10 pain. VS per EMS BP-178/76 HR-96 O2sat- 93% on 4L. Dr. Owens Shark at bedside.

## 2018-01-17 NOTE — Consult Note (Signed)
Pharmacy Antibiotic Note  Mark [name]. is a 72 y.o. male admitted on 01/17/2018 with sepsis.  Pharmacy has been consulted for vancomycin dosing. PMH includes chronic anemia, right lower extremity DVT status post IVC filter 12/31/17, HIT, HTN. Patient normally gets HD on MWF but required an urgent HD session today where he became hypotensive and febrile. Blood cultures are being drawn.  Plan: Will order Vancomycin 2g LD. Will order vancomycin 1g IV to be given with HD on MWF.   Will order Vancomycin level prior to 3rd HD. Pre-HD target level 15-25 mcg/mL.  Upon admission to ICU patient case examined and discussed with CCM. Decision was made to discontinue cefepime - no doses administered.   Height: 5\' 11"  (180.3 cm) Weight: 181 lb 10.5 oz (82.4 kg) IBW/kg (Calculated) : 75.3  Temp (24hrs), Avg:98.5 F (36.9 C), Min:98 F (36.7 C), Max:99.5 F (37.5 C)  Recent Labs  Lab 01/12/18 0122 01/12/18 0125 01/13/18 0340 01/14/18 0523 01/14/18 1427 01/15/18 0744 01/15/18 0923 01/17/18 0630  WBC 3.4*  --  2.4* 4.0  --   --  3.7* 4.0  CREATININE 4.28*  --  3.26*  --  6.02* 4.08*  --  4.00*  LATICACIDVEN  --  1.66  --   --   --   --   --   --     Estimated Creatinine Clearance: 17.8 mL/min (A) (by C-G formula based on SCr of 4 mg/dL (H)).    Allergies  Allergen Reactions  . Acyclovir And Related   . Heparin Other (See Comments)    Excessive bleeding per pt.   . Ibuprofen Other (See Comments)    DUE TO DIALYSIS  . Multivitamin [Centrum] Other (See Comments)    DUE TO DIALYSIS  . Daypro [Oxaprozin] Swelling and Rash    Other reaction(s): Other (See Comments)  . Tape Rash    Antimicrobials this admission: vancomycin 10/17 >>   Microbiology results: 10/17 BCx: pending  Thank you for allowing pharmacy to be a part of this patient's care.  Laresha Bacorn L 01/17/2018 2:31 PM

## 2018-01-17 NOTE — Progress Notes (Signed)
Post HD assessment. Pt tolerated tx well without c/o or complication. Net UF 2302, goal met. Pt's work of breathing improved with HD tx. Pre HD temp of 99.5 axillary, MD ordered blood cultures X2, drawn post HD and sent to lab.    01/17/18 1341  Vital Signs  Temp 98.3 F (36.8 C)  Temp Source Axillary  Pulse Rate 79  Pulse Rate Source Monitor  Resp 14  BP (!) 116/57  BP Location Right Arm  BP Method Automatic  Patient Position (if appropriate) Lying  Oxygen Therapy  SpO2 94 %  O2 Device Nasal Cannula  O2 Flow Rate (L/min) 4 L/min  Dialysis Weight  Weight 82.4 kg  Type of Weight Post-Dialysis  Post-Hemodialysis Assessment  Rinseback Volume (mL) 250 mL  KECN 68.1 V  Dialyzer Clearance Lightly streaked  Duration of HD Treatment -hour(s) 3 hour(s)  Hemodialysis Intake (mL) 500 mL  UF Total -Machine (mL) 2802 mL  Net UF (mL) 2302 mL  Tolerated HD Treatment Yes  AVG/AVF Arterial Site Held (minutes) 10 minutes  AVG/AVF Venous Site Held (minutes) 10 minutes  Education / Care Plan  Dialysis Education Provided Yes  Documented Education in Care Plan Yes  Fistula / Graft Left Forearm Arteriovenous fistula  No Placement Date or Time found.   Placed prior to admission: No  Orientation: Left  Access Location: Forearm  Access Type: Arteriovenous fistula  Site Condition No complications  Fistula / Graft Assessment Present;Thrill;Bruit  Status Deaccessed  Drainage Description None

## 2018-01-17 NOTE — Progress Notes (Signed)
Pt taken off bipap, tolerating well at this time, sats 95%, respiratory rate 18/min. Will continue to monitor.

## 2018-01-17 NOTE — Consult Note (Addendum)
Mark [name], M.D Pulmonary Critical Care & Sleep Medicine   ICU Patient Progress Note  Name: Mark [name].  DOB: Mark [name]  MRN: [id number]  Date: 01/17/2018   '[x]' I have reviewed the flowsheet and previous day's notes.  IMPRESSION:  Respiratory failure: Most likely due to fluid overload, improved postdialysis Questionable sepsis: History of chills but WBC count is normal no fever, will continue with vancomycin and adjust with culture hold cefepime for now as there is no clear source of infection History of COPD: Was on home nebulization will continue that, patient was supposed to be on Symbicort we offered to continue however patient refused to take it in the hospital, no evidence of any wheezing to suggest any account of acute exacerbation Renal failure ESRD Monday Wednesday Friday HD per renal Bladder cancer CLL management per hematology oncology as an outpatient Chronic right-sided pleural effusion: Questionable was drained in the past (not able to identify on pulmonary note or [facility] records), at this point patient is back to baseline status if he develops worsening respiratory failure or worsening sepsis will consider draining it otherwise monitor closely DVT: Status post IVC filter, not able to tolerate antiregulation due to anemia and bleeding, patient complains of chest pain- as we cannot anticoagulate doing further testing to look for PE will not add anything as we cannot treat with any kind of anticoagulation and patient already has IVC filter, monitor closely if continue to have issues consider echo for any RV strain, last echo did not suggest anything in September 2019 Diabetes with poorly controlled blood sugar: Resume Levemir increase it to 20, was on steroids and borderline blood pressure-we will add Hydrocort 25 every 12 which might further worsen the blood sugar so monitor closely and taper steroids as soon as possible Anemia: Most likely related to ESRD monitor for any  active bleeding transfuse with hemoglobin less than 7  PLAN:  CVS: Check serial enzymes, if worsening troponin consider echo and cardiology consult, continue with aspirin RS: Back to baseline oxygen status -Bronchodilators -- Aspiration precaution -- Vent protocol on vented patient  ID: Not entirely convinced on sepsis, follow culture, continue Vanco hold cefepime for now -- Follow culture and adjust ENDO: Poorly controlled diabetes increase Levemir to 29 - Was on steroids now came with borderline blood pressure will use cautious Hydrocort 25 every 12 and taper as tolerated -- SSI monitor blood sugar GI: Continue diet Pepcid RENAL: Monday Wednesday Friday dialysis as per renal -- Electrolyte replacement protocol, Monitor Cr and K  CNS: No acute issues HEMATOLOGY: Thrombocytopenia with platelet of 51 monitor questionable chronic be related to underlying CLL if continue to have issues consider hematology consultation -- Monitor HB and PLT MUSCULOSKELETAL: No acute issue PAIN AND SEDATION: PRN meds  Skin/Wound: Chronic changes chronic changes  Electrolytes: Replace electrolytes per ICU electrolyte replacement protocol.   IVF: none  Nutrition: Renal diet  Prophylaxis: DVT Prophylaxis with SCDs,. GI Prophylaxis.   Restraints: None  PT/OT eval and treat. OOB when appropriate.   Lines/Tubes: No Foley no central line. ADVANCE DIRECTIVE: Partial code, okay with CPR no intubation FAMILY DISCUSSION: Spoke with patient Quality Care: PPI, DVT prophylaxis, HOB elevated, Infection control all reviewed and addressed. Events and notes from last 24 hours reviewed. Care plan discussed on multidisciplinary rounds CC TIME: 35 minutes Patient is back to baseline currently on 3 to 4 L oxygen will put a transfer order for telemetry -We will follow on as-needed basis   HISTORY OF PRESENT  ILLNESS:  Mark [name]  is a 72 y.o. male with a known history of end-stage renal disease on hemodialysis,  chronic anemia, right lower extremity DVT status post IVC filter, H IT, hypertension, Jerrye Bushy comes to the emergency room with chest tightness and increasing shortness of breath. Patient was found to be short of breath placed on BiPAP. Chest x-ray shows pulmonary edema. No fever. White count normal doubt pneumonia. Patient also received a port on his right upper chest due to poor stick. Patient has right lower extremity DVT. He underwent IVC filter secondary to H IT from heparin. He was started on eliquis however discontinued because of his worsening anemia.  Carries a diagnosis of CLL follows with [facility] hematology, also been seen by pulmonary at [facility] PFT showed moderate restrictive pattern, patient has chronic right-sided pleural effusion as per the patient it was tapped in the past and was told it is due to CLL however I could not identify any of the records in the system - Patient was brought in on a BiPAP, immediately after coming to ICU he was switched to 4 L oxygen via nasal cannula, underwent hemodialysis and feels much better back to baseline oxygen status - Component of complaints of chest pain - No history of any cough, history of extensive smoking stopped since 6 years, was prescribed inhaler only uses nebulizer at home - Also had some chills but no fever at home WBC count has been normal here - Patient was recently been treated for pneumonia and was on tapering doses of steroids, last dose was 2 days ago -In the emergency room also found to have elevated blood sugar, patient did not take his Levemir last night   Subjective: Postdialysis feels better '[]' The patient is critically ill on     '[]' Mechanical ventilation '[]' Pressors  '[x]' BiPAP '[]'      [START ON 01/18/2018] aspirin EC 81 mg Daily  aspirin 325 mg Daily  Chlorhexidine Gluconate Cloth 6 each Q0600  famotidine 20 mg Daily  [START ON 01/18/2018] hydrocortisone sod succinate (SOLU-CORTEF) inj 25 mg Daily  insulin aspart 0-15 Units  Q4H  insulin detemir 18 Units Daily  ipratropium-albuterol 3 mL Q6H  midodrine 10 mg TID WC    ALLERGIES: Allergies  Allergen Reactions  . Acyclovir And Related   . Heparin Other (See Comments)    Excessive bleeding per pt.   . Ibuprofen Other (See Comments)    DUE TO DIALYSIS  . Multivitamin [Centrum] Other (See Comments)    DUE TO DIALYSIS  . Daypro [Oxaprozin] Swelling and Rash    Other reaction(s): Other (See Comments)  . Tape Rash    PAST MEDICAL HISTORY:   Past Medical History:  Diagnosis Date  . Anemia   . Arthritis   . Bladder cancer ([place])   . Chronic kidney disease   . Diabetes mellitus without complication ([place])   . Dyspnea    DOE  . ESRD on hemodialysis [facility])    Started dialysis in 2014, gets HD as of 2019 in [place] w/ Dr Rolly Salter on MWF schedule.    [name] GERD (gastroesophageal reflux disease)   . HOH (hard of hearing)   . Hypertension   . IBS (irritable bowel syndrome)   . Lymphoma ([place]) 09/27/2014  . Neuropathy    RIGHT LEG  . Stroke [facility])    TIA    PAST SURGICAL HISTORY: Past Surgical History:  Procedure Laterality Date  . BACK SURGERY  2007  . CATARACT EXTRACTION W/PHACO Left 03/23/2016  Procedure: CATARACT EXTRACTION PHACO AND INTRAOCULAR LENS PLACEMENT (IOC);  Surgeon: Leandrew Koyanagi, MD;  Location: [facility] ORS;  Service: Ophthalmology;  Laterality: Left;  [place] 52.6 AP% 14.7 CDE 7.72 Fluid pack lot # [id number]  . CATARACT EXTRACTION W/PHACO Right 05/30/2016   Procedure: CATARACT EXTRACTION PHACO AND INTRAOCULAR LENS PLACEMENT ([place]);  Surgeon: Leandrew Koyanagi, MD;  Location: [facility] ORS;  Service: Ophthalmology;  Laterality: Right;  [place] 01:08 AP% 13.9 CDE 9.53  note: could not get IV in patient, so procedure done without anesthesia personell present, ok per Dr Wallace Going fluid pack lo t# [id number]  . CIRCUMCISION    . IVC FILTER INSERTION N/A 12/31/2017   Procedure: IVC FILTER INSERTION;  Surgeon: Algernon Huxley, MD;  Location: [place] CV  LAB;  Service: Cardiovascular;  Laterality: N/A;  . PERIPHERAL VASCULAR CATHETERIZATION Left 08/19/2015   Procedure: A/V Shuntogram/Fistulagram;  Surgeon: Algernon Huxley, MD;  Location: [place] CV LAB;  Service: Cardiovascular;  Laterality: Left;  . PERIPHERAL VASCULAR CATHETERIZATION N/A 08/19/2015   Procedure: A/V Shunt Intervention;  Surgeon: Algernon Huxley, MD;  Location: [place] CV LAB;  Service: Cardiovascular;  Laterality: N/A;  . PORTA CATH INSERTION N/A 01/02/2018   Procedure: PORTA CATH INSERTION;  Surgeon: Katha Cabal, MD;  Location: [place] CV LAB;  Service: Cardiovascular;  Laterality: N/A;    PAST SOCIAL HISTORY: Social History   Socioeconomic History  . Marital status: Married    Spouse name: Not on file  . Number of children: Not on file  . Years of education: Not on file  . Highest education level: Not on file  Occupational History  . Occupation: retired  Scientific laboratory technician  . Financial resource strain: Not hard at all  . Food insecurity:    Worry: Never true    Inability: Never true  . Transportation needs:    Medical: No    Non-medical: No  Tobacco Use  . Smoking status: Former Smoker    Types: Cigarettes    Last attempt to quit: 05/19/2013    Years since quitting: 4.6  . Smokeless tobacco: Never Used  . Tobacco comment: quit  Substance and Sexual Activity  . Alcohol use: No    Alcohol/week: 0.0 standard drinks  . Drug use: No  . Sexual activity: Not on file  Lifestyle  . Physical activity:    Days per week: 0 days    Minutes per session: 0 min  . Stress: To some extent  Relationships  . Social connections:    Talks on phone: Twice a week    Gets together: Twice a week    Attends religious service: Never    Active member of club or organization: Yes    Attends meetings of clubs or organizations: Never    Relationship status: Married  . Intimate partner violence:    Fear of current or ex partner: No    Emotionally abused: No     Physically abused: No    Forced sexual activity: No  Other Topics Concern  . Not on file  Social History Narrative   Lives with wife    FAMILY HISTORY: Family History  Problem Relation Age of Onset  . Cancer Mother   . Kidney cancer Neg Hx   . Prostate cancer Neg Hx   . Kidney failure Neg Hx   . Bladder Cancer Neg Hx     Vital Signs:   BP (!) 116/57 (BP Location: Right Arm)   Pulse 79   Temp  98.3 F (36.8 C) (Axillary)   Resp 14   Ht '5\' 11"'  (1.803 m)   Wt 82.4 kg   SpO2 94%   BMI 25.34 kg/m       Prehospital Care Cardiac Rhythm: Normal sinus rhythm  Temp (24hrs), Avg:98.5 F (36.9 C), Min:98 F (36.7 C), Max:99.5 F (37.5 C)   Intake/Output:Last shift:      10/17 0701 - 10/17 1900 In: -  Out: 2302 Last 3 shifts: No intake/output data recorded.  Intake/Output Summary (Last 24 hours) at 01/17/2018 1349 Last data filed at 01/17/2018 1341 Gross per 24 hour  Intake -  Output 2302 ml  Net -2302 ml    Objective:   Physical Exam:  General: comfortable, no acute distress HEENT: pupils reactive, sclera anicteric,  Neck: No adenopathy or thyroid swelling, no lymphadenopathy or JVD, supple CVS: S1S2 no murmurs RS: Mod AE bilaterally decreased at both bases minimal rales Abd: soft, non tender,  Neuro: non focal, awake, alert Extrm: Chronic changes  CBC w/Diff Recent Labs  Lab 01/14/18 0523 01/15/18 0923 01/17/18 0630  HGB 6.5* 8.8* 7.8*  HCT 21.1* 28.1* 24.4*  WBC 4.0 3.7* 4.0  PLT 104* 72* 51*     Chemistry Recent Labs  Lab 01/14/18 1427 01/15/18 0744 01/17/18 0630  NA 138 139 135  K 4.8 3.6 3.7  CL 97* 99 95*  CO2 '24 27 28  ' BUN 67* 37* 38*  CREATININE 6.02* 4.08* 4.00*  GLUCOSE 314* 237* 475*    Hepatic Function Latest Ref Rng & Units 01/15/2018 01/14/2018 01/12/2018  Total Protein 6.5 - 8.1 g/dL - - 5.2(L)  Albumin 3.5 - 5.0 g/dL 2.7(L) 2.5(L) 2.5(L)  AST 15 - 41 U/L - - 19  ALT 0 - 44 U/L - - 20  Alk Phosphatase 38 - 126 U/L - - 56    Total Bilirubin 0.3 - 1.2 mg/dL - - 1.0  Bilirubin, Direct 0.1 - 0.5 mg/dL - - -     Lactic Acid    Component Value Date/Time   LATICACIDVEN 1.66 01/12/2018 0125     Micro  '@MICRO' @ Recent Results (from the past 720 hour(s))  Expectorated sputum assessment w rflx to resp cult     Status: None   Collection Time: 12/20/17  5:41 AM  Result Value Ref Range Status   Specimen Description SPUTUM  Final   Special Requests Normal  Final   Sputum evaluation   Final    THIS SPECIMEN IS ACCEPTABLE FOR SPUTUM CULTURE Performed at [facility], [address], [place], [place] [address]    Report Status 12/20/2017 FINAL  Final  Culture, respiratory     Status: None   Collection Time: 12/20/17  5:41 AM  Result Value Ref Range Status   Specimen Description   Final    SPUTUM Performed at [facility], [address], [place], [place] [address]    Special Requests   Final    Normal Reflexed from [phone] Performed at [facility], [address] [place], [place] [address]    Gram Stain   Final    FEW WBC PRESENT, PREDOMINANTLY PMN MODERATE GRAM NEGATIVE RODS MODERATE GRAM POSITIVE COCCI    Culture   Final    Consistent with normal respiratory flora. Performed at [facility], [place] [address] [place], [place] [address]    Report Status 12/22/2017 FINAL  Final  MRSA PCR Screening     Status: None   Collection Time: 12/25/17  9:58 AM  Result Value Ref Range Status   MRSA by PCR NEGATIVE NEGATIVE Final    Comment:        The GeneXpert MRSA Assay (FDA approved for NASAL specimens only), is one component of a comprehensive MRSA colonization surveillance program. It is not intended to diagnose MRSA infection nor to guide or monitor treatment for MRSA infections. Performed at [facility], [address] [place], [place] [address]   Culture, blood (routine x 2)     Status: None   Collection Time: 12/31/17 12:30 AM  Result Value Ref  Range Status   Specimen Description BLOOD RIGHT UPPER ARM  Final   Special Requests   Final    BOTTLES DRAWN AEROBIC AND ANAEROBIC Blood Culture adequate volume   Culture   Final    NO GROWTH 5 DAYS Performed at [facility], [address] [place], [place] [address]    Report Status 01/05/2018 FINAL  Final  Urine culture     Status: None   Collection Time: 12/31/17  1:13 AM  Result Value Ref Range Status   Specimen Description   Final    URINE, RANDOM Performed at [facility], [address] [place], [place] [address]    Special Requests   Final    NONE Performed at [facility], [address], [place], [place] [address]    Culture   Final    NO GROWTH Performed at [facility], [place] [address], [place], [place] [address]    Report Status 01/01/2018 FINAL  Final  Culture, blood (routine x 2)     Status: None   Collection Time: 12/31/17  2:34 AM  Result Value Ref Range Status   Specimen Description BLOOD RIGHT ANTECUBITAL  Final   Special Requests   Final    BOTTLES DRAWN AEROBIC AND ANAEROBIC Blood Culture results may not be optimal due to an excessive volume of blood received in culture bottles   Culture   Final    NO GROWTH 5 DAYS Performed at [facility], [address] [place], [place] [address]    Report Status 01/05/2018 FINAL  Final  MRSA PCR Screening     Status: None   Collection Time: 12/31/17  4:43 AM  Result Value Ref Range Status   MRSA by PCR NEGATIVE NEGATIVE Final    Comment:        The GeneXpert MRSA Assay (FDA approved for NASAL specimens only), is one component of a comprehensive MRSA colonization surveillance program. It is not intended to diagnose MRSA infection nor to guide or monitor treatment for MRSA infections. Performed at [facility], [address] [place], [place] [address]   Culture, blood (Routine x 2)     Status: None   Collection Time: 01/12/18  1:15 AM  Result Value  Ref Range Status   Specimen Description BLOOD PORTA CATH  Final   Special Requests   Final    BOTTLES DRAWN AEROBIC AND ANAEROBIC Blood Culture adequate volume   Culture   Final    NO GROWTH 5 DAYS Performed at [facility], [place] [address] [place], [place] [address]    Report Status 01/17/2018 FINAL  Final  Urine Culture     Status: None   Collection Time: 01/12/18  1:30 AM  Result Value Ref Range Status   Specimen Description URINE, RANDOM  Final   Special Requests NONE  Final   Culture   Final  NO GROWTH Performed at [facility], [place] [address] [place], [place] [address]    Report Status 01/13/2018 FINAL  Final  Culture, blood (Routine x 2)     Status: None   Collection Time: 01/12/18  1:40 AM  Result Value Ref Range Status   Specimen Description BLOOD LEFT ARM  Final   Special Requests   Final    BOTTLES DRAWN AEROBIC AND ANAEROBIC Blood Culture results may not be optimal due to an excessive volume of blood received in culture bottles   Culture   Final    NO GROWTH 5 DAYS Performed at [facility], [place] [address] [place], [place] [address]    Report Status 01/17/2018 FINAL  Final  Respiratory Panel by PCR     Status: Abnormal   Collection Time: 01/13/18  4:04 PM  Result Value Ref Range Status   Adenovirus NOT DETECTED NOT DETECTED Final   Coronavirus 229E NOT DETECTED NOT DETECTED Final   Coronavirus HKU1 NOT DETECTED NOT DETECTED Final   Coronavirus NL63 NOT DETECTED NOT DETECTED Final   Coronavirus OC43 NOT DETECTED NOT DETECTED Final   Metapneumovirus NOT DETECTED NOT DETECTED Final   Rhinovirus / Enterovirus DETECTED (A) NOT DETECTED Final   Influenza A NOT DETECTED NOT DETECTED Final   Influenza A H1 NOT DETECTED NOT DETECTED Final   Influenza A H1 2009 NOT DETECTED NOT DETECTED Final   Influenza A H3 NOT DETECTED NOT DETECTED Final   Influenza B NOT DETECTED NOT DETECTED Final   Parainfluenza Virus 1 NOT DETECTED NOT DETECTED Final    Parainfluenza Virus 2 NOT DETECTED NOT DETECTED Final   Parainfluenza Virus 3 NOT DETECTED NOT DETECTED Final   Parainfluenza Virus 4 NOT DETECTED NOT DETECTED Final   Respiratory Syncytial Virus NOT DETECTED NOT DETECTED Final   Bordetella pertussis NOT DETECTED NOT DETECTED Final   Chlamydophila pneumoniae NOT DETECTED NOT DETECTED Final   Mycoplasma pneumoniae NOT DETECTED NOT DETECTED Final    Comment: Performed at [facility], [place] [address], [address] [place], [place] [address]     ABG    Component Value Date/Time   PHART 7.46 (H) 01/17/2018 0831   PCO2ART 41 01/17/2018 0831   PO2ART 54 (L) 01/17/2018 0831   HCO3 29.2 (H) 01/17/2018 0831   TCO2 30 01/12/2018 0248   O2SAT 89.5 01/17/2018 0831     Cardiac Enzymes Recent Labs    01/17/18 0630  TROPONINI 0.09*      Coagulation Lab Results  Component Value Date   INR 1.41 01/12/2018   APTT 38 (H) 02/01/2017     Radiology Results Reviewed by me and compared with previous imaging studies Dg Chest Port 1 View  Result Date: 01/17/2018 CLINICAL DATA:  Left-sided chest pain. EXAM: PORTABLE CHEST 1 VIEW COMPARISON:  CT 01/06/2018. Chest 01/05/2018. FINDINGS: Shallow inspiration with linear atelectasis in the lung bases. Increased opacity in the left lung may represent asymmetrical edema or pneumonia. Small right pleural effusion, improving since previous study. No pneumothorax. Power port type central venous catheter with tip over the cavoatrial junction region. Calcification of the aorta. IMPRESSION: Shallow inspiration with atelectasis in the lung bases. Increased opacity in the left lung may represent asymmetrical edema or pneumonia. Small right pleural effusion, improving since previous study. Electronically Signed   By: Lucienne Capers M.D.   On: 01/17/2018 06:53      ECHO  Sept 2019 Result Narrative   Technically difficult study due to chest wall/lung  interference  Ultrasound enhancing agent utilized to improve endocardial  border  definition  Normal left ventricular systolic function, ejection fraction > 84%  Diastolic dysfunction - grade II (elevated filling pressures)  Dilated left atrium - mild  Normal right ventricular systolic function      Mark Rocker, MD 01/17/2018 '@NOWR' @   The patient is: '[x]'  acutely ill Risk of deterioration: '[]'  moderate   '[x]'  critically ill  '[]'  high   '[x]' See my orders for details  My assessment, plan of care, findings, medications, side effects etc were discussed with: '[x]' nursing '[]' PT/OT   '[x]' respiratory therapy '[]' Dr.  '[]' family '[]'    Mark [name], M.D. Pulmonary Critical Care & Sleep Medicine

## 2018-01-17 NOTE — Progress Notes (Signed)
HD tx start   01/17/18 1025  Vital Signs  Pulse Rate 76  Pulse Rate Source Monitor  Resp 18  BP (!) 122/48  BP Location Right Arm  BP Method Automatic  Patient Position (if appropriate) Lying  Oxygen Therapy  SpO2 99 %  O2 Device Bi-PAP  During Hemodialysis Assessment  Blood Flow Rate (mL/min) 400 mL/min  Arterial Pressure (mmHg) -140 mmHg  Venous Pressure (mmHg) 150 mmHg  Transmembrane Pressure (mmHg) 70 mmHg  Ultrafiltration Rate (mL/min) 830 mL/min  Dialysate Flow Rate (mL/min) 800 ml/min  Conductivity: Machine  13.6  HD Safety Checks Performed Yes  Dialysis Fluid Bolus Normal Saline  Bolus Amount (mL) 250 mL  Intra-Hemodialysis Comments Tx initiated  Fistula / Graft Left Forearm Arteriovenous fistula  No Placement Date or Time found.   Placed prior to admission: No  Orientation: Left  Access Location: Forearm  Access Type: Arteriovenous fistula  Status Accessed  Needle Size 15

## 2018-01-18 ENCOUNTER — Inpatient Hospital Stay: Payer: Medicare HMO

## 2018-01-18 ENCOUNTER — Other Ambulatory Visit: Payer: Self-pay

## 2018-01-18 LAB — CBC WITH DIFFERENTIAL/PLATELET
ABS IMMATURE GRANULOCYTES: 0.02 10*3/uL (ref 0.00–0.07)
BASOS ABS: 0 10*3/uL (ref 0.0–0.1)
Basophils Relative: 0 %
EOS PCT: 1 %
Eosinophils Absolute: 0 10*3/uL (ref 0.0–0.5)
HEMATOCRIT: 23.5 % — AB (ref 39.0–52.0)
HEMOGLOBIN: 7.4 g/dL — AB (ref 13.0–17.0)
Immature Granulocytes: 1 %
LYMPHS ABS: 1 10*3/uL (ref 0.7–4.0)
LYMPHS PCT: 27 %
MCH: 29.1 pg (ref 26.0–34.0)
MCHC: 31.5 g/dL (ref 30.0–36.0)
MCV: 92.5 fL (ref 80.0–100.0)
MONO ABS: 0.1 10*3/uL (ref 0.1–1.0)
Monocytes Relative: 2 %
NEUTROS ABS: 2.5 10*3/uL (ref 1.7–7.7)
NRBC: 0 % (ref 0.0–0.2)
Neutrophils Relative %: 69 %
Platelets: 54 10*3/uL — ABNORMAL LOW (ref 150–400)
RBC: 2.54 MIL/uL — AB (ref 4.22–5.81)
RDW: 16.5 % — ABNORMAL HIGH (ref 11.5–15.5)
WBC: 3.6 10*3/uL — AB (ref 4.0–10.5)

## 2018-01-18 LAB — TROPONIN I
TROPONIN I: 0.05 ng/mL — AB (ref <0.03)
Troponin I: 0.06 ng/mL (ref <0.03)
Troponin I: 0.05 ng/mL (ref <0.03)

## 2018-01-18 LAB — GLUCOSE, CAPILLARY
GLUCOSE-CAPILLARY: 80 mg/dL (ref 70–99)
GLUCOSE-CAPILLARY: 136 mg/dL — AB (ref 70–99)
GLUCOSE-CAPILLARY: 200 mg/dL — AB (ref 70–99)
Glucose-Capillary: 30 mg/dL — CL (ref 70–99)
Glucose-Capillary: 324 mg/dL — ABNORMAL HIGH (ref 70–99)
Glucose-Capillary: 331 mg/dL — ABNORMAL HIGH (ref 70–99)

## 2018-01-18 LAB — COMPREHENSIVE METABOLIC PANEL
ALBUMIN: 2.6 g/dL — AB (ref 3.5–5.0)
ALK PHOS: 50 U/L (ref 38–126)
ALT: 15 U/L (ref 0–44)
AST: 13 U/L — ABNORMAL LOW (ref 15–41)
Anion gap: 12 (ref 5–15)
BILIRUBIN TOTAL: 0.9 mg/dL (ref 0.3–1.2)
BUN: 35 mg/dL — ABNORMAL HIGH (ref 8–23)
CALCIUM: 6.7 mg/dL — AB (ref 8.9–10.3)
CO2: 29 mmol/L (ref 22–32)
Chloride: 99 mmol/L (ref 98–111)
Creatinine, Ser: 3.91 mg/dL — ABNORMAL HIGH (ref 0.61–1.24)
GFR calc Af Amer: 16 mL/min — ABNORMAL LOW (ref >60)
GFR calc non Af Amer: 14 mL/min — ABNORMAL LOW (ref >60)
Glucose, Bld: 48 mg/dL — ABNORMAL LOW (ref 70–99)
Potassium: 3.5 mmol/L (ref 3.5–5.1)
SODIUM: 140 mmol/L (ref 135–145)
Total Protein: 5.2 g/dL — ABNORMAL LOW (ref 6.5–8.1)

## 2018-01-18 MED ORDER — PREDNISOLONE ACETATE 0.12 % OP SUSP
1.0000 [drp] | Freq: Four times a day (QID) | OPHTHALMIC | Status: DC
  Administered 2018-01-18 – 2018-01-20 (×5): 1 [drp] via OPHTHALMIC
  Filled 2018-01-18: qty 5

## 2018-01-18 MED ORDER — SODIUM CHLORIDE 0.9% FLUSH: 10.0000 mL | INTRAVENOUS | Status: DC | PRN

## 2018-01-18 MED ORDER — GABAPENTIN 100 MG PO CAPS
100.0000 mg | ORAL_CAPSULE | Freq: Two times a day (BID) | ORAL | Status: DC
  Administered 2018-01-18 – 2018-01-19 (×3): 100 mg via ORAL
  Filled 2018-01-18 (×4): qty 1

## 2018-01-18 MED ORDER — MIDODRINE HCL 5 MG PO TABS
10.0000 mg | ORAL_TABLET | ORAL | Status: DC
  Administered 2018-01-18: 10 mg via ORAL
  Filled 2018-01-18: qty 2

## 2018-01-18 MED ORDER — EPOETIN ALFA 10000 UNIT/ML IJ SOLN
4000.0000 [IU] | INTRAMUSCULAR | Status: DC
  Administered 2018-01-18: 4000 [IU] via INTRAVENOUS

## 2018-01-18 MED ORDER — PREDNISONE 20 MG PO TABS
20.0000 mg | ORAL_TABLET | Freq: Every day | ORAL | Status: DC
  Administered 2018-01-19 – 2018-01-20 (×2): 20 mg via ORAL
  Filled 2018-01-18: qty 1
  Filled 2018-01-18: qty 2

## 2018-01-18 MED ORDER — VITAMIN C 500 MG PO TABS
500.0000 mg | ORAL_TABLET | Freq: Two times a day (BID) | ORAL | Status: DC
  Administered 2018-01-18 – 2018-01-20 (×4): 500 mg via ORAL
  Filled 2018-01-18 (×4): qty 1

## 2018-01-18 MED ORDER — PREDNISOLONE 5 MG PO TABS: 20.0000 mg | ORAL_TABLET | Freq: Every day | ORAL | Status: DC

## 2018-01-18 MED ORDER — RENA-VITE PO TABS
1.0000 | ORAL_TABLET | Freq: Every day | ORAL | Status: DC
  Administered 2018-01-18 – 2018-01-19 (×2): 1 via ORAL
  Filled 2018-01-18 (×3): qty 1

## 2018-01-18 MED ORDER — NEPRO/CARBSTEADY PO LIQD
237.0000 mL | Freq: Two times a day (BID) | ORAL | Status: DC
  Administered 2018-01-19: 237 mL via ORAL

## 2018-01-18 MED ORDER — SODIUM CHLORIDE 0.9% FLUSH
10.0000 mL | Freq: Two times a day (BID) | INTRAVENOUS | Status: DC
  Administered 2018-01-18 – 2018-01-20 (×4): 10 mL

## 2018-01-18 MED ORDER — INSULIN DETEMIR 100 UNIT/ML ~~LOC~~ SOLN
12.0000 [IU] | Freq: Every day | SUBCUTANEOUS | Status: DC
  Administered 2018-01-19 – 2018-01-20 (×2): 12 [IU] via SUBCUTANEOUS
  Filled 2018-01-18 (×3): qty 0.12

## 2018-01-18 MED ORDER — POLYVINYL ALCOHOL 1.4 % OP SOLN
1.0000 [drp] | OPHTHALMIC | Status: DC | PRN
  Administered 2018-01-19: 1 [drp] via OPHTHALMIC
  Filled 2018-01-18: qty 15

## 2018-01-18 NOTE — Progress Notes (Signed)
Inpatient Diabetes Program Recommendations  AACE/ADA: New Consensus Statement on Inpatient Glycemic Control (2019)  Target Ranges:  Prepandial:   less than 140 mg/dL      Peak postprandial:   less than 180 mg/dL (1-2 hours)      Critically ill patients:  140 - 180 mg/dL   Results for Mark [name], Mark [name] (MRN [id number]) as of 01/18/2018 08:38  Ref. Range 01/17/2018 09:48 01/17/2018 12:35 01/17/2018 13:13 01/17/2018 15:53 01/17/2018 20:54 01/18/2018 07:06 01/18/2018 07:35  Glucose-Capillary Latest Ref Range: 70 - 99 mg/dL 394 (H) 151 (H)  Levemir 18 units 135 (H) 296 (H)  Novolog 8 units 149 (H) 30 (LL) 80   Results for Mark [name], Mark [name] (MRN [id number]) as of 01/17/2018 10:12  Ref. Range 01/17/2018 06:30  Glucose Latest Ref Range: 70 - 99 mg/dL 475 (H)  Results for Mark [name], Mark [name] (MRN [id number]) as of 01/17/2018 10:12  Ref. Range 01/14/2018 03:53  Hemoglobin A1C Latest Ref Range: 4.8 - 5.6 % 6.5 (H)   Review of Glycemic Control  Diabetes history: DM2 Outpatient Diabetes medications: Amaryl 4 mg QHS, Levemir 14 units daily Current orders for Inpatient glycemic control: Levemir 18 units daily, Novolog 0-15 units TID with meals, Novolog 0-5 units QHS; Solucortef 25 mg daily  Inpatient Diabetes Program Recommendations:  Insulin - Basal: Noted patient received hemodialysis on 01/17/18 and received Levemir 18 units at 12:00 pm on 01/17/18. Glucose down to 30 mg/dl this morning. Please consider decreasing Levemir to 8 units QHS. Insulin-Correction: Please consider decreasing Novolog correction to sensitive scale (0-9 units TID).  Thanks, Barnie Alderman, RN, MSN, CDE Diabetes Coordinator Inpatient Diabetes Program [phone] (Team Pager from 8am to 5pm)

## 2018-01-18 NOTE — Progress Notes (Signed)
This note also relates to the following rows which could not be included: Pulse Rate - Cannot attach notes to unvalidated device data Resp - Cannot attach notes to unvalidated device data BP - Cannot attach notes to unvalidated device data  Hd completed  

## 2018-01-18 NOTE — Progress Notes (Signed)
[facility] [place] NAME: Mark [name]    MR#:  [id number]  DATE OF BIRTH:  [birth year]  SUBJECTIVE:  CHIEF COMPLAINT:  No chief complaint on file.  Still has shortness of breath and orthopnea.  REVIEW OF SYSTEMS:    Review of Systems  Constitutional: Positive for malaise/fatigue. Negative for chills and fever.  HENT: Negative for sore throat.   Eyes: Negative for blurred vision, double vision and pain.  Respiratory: Positive for cough and shortness of breath. Negative for hemoptysis and wheezing.   Cardiovascular: Positive for orthopnea. Negative for chest pain, palpitations and leg swelling.  Gastrointestinal: Negative for abdominal pain, constipation, diarrhea, heartburn, nausea and vomiting.  Genitourinary: Negative for dysuria and hematuria.  Musculoskeletal: Negative for back pain and joint pain.  Skin: Negative for rash.  Neurological: Negative for sensory change, speech change, focal weakness and headaches.  Endo/Heme/Allergies: Does not bruise/bleed easily.  Psychiatric/Behavioral: Negative for depression. The patient is not nervous/anxious.     DRUG ALLERGIES:   Allergies  Allergen Reactions  . Acyclovir And Related   . Heparin Other (See Comments)    Excessive bleeding per pt.   . Ibuprofen Other (See Comments)    DUE TO DIALYSIS  . Multivitamin [Centrum] Other (See Comments)    DUE TO DIALYSIS  . Daypro [Oxaprozin] Swelling and Rash    Other reaction(s): Other (See Comments)  . Tape Rash    VITALS:  Blood pressure (!) 153/71, pulse 74, temperature 99.3 F (37.4 C), temperature source Oral, resp. rate 13, height 5\' 11"  (1.803 m), weight 82.4 kg, SpO2 99 %.  PHYSICAL EXAMINATION:   Physical Exam  GENERAL:  72 y.o.-year-old patient lying in the bed with no acute distress.  EYES: Pupils equal, round, reactive to light and accommodation. No scleral icterus. Extraocular muscles intact.  HEENT: Head atraumatic,  normocephalic. Oropharynx and nasopharynx clear.  NECK:  Supple, no jugular venous distention. No thyroid enlargement, no tenderness.  LUNGS:  decreased breath sounds bilaterally CARDIOVASCULAR: S1, S2 normal. No murmurs, rubs, or gallops.  ABDOMEN: Soft, nontender, nondistended. Bowel sounds present. No organomegaly or mass.  EXTREMITIES: No cyanosis, clubbing or edema b/l.    NEUROLOGIC: Cranial nerves II through XII are intact. No focal Motor or sensory deficits b/l.   PSYCHIATRIC: The patient is alert and oriented x 3.  SKIN: No obvious rash, lesion, or ulcer.   LABORATORY PANEL:   CBC Recent Labs  Lab 01/18/18 0327  WBC 3.6*  HGB 7.4*  HCT 23.5*  PLT 54*   ------------------------------------------------------------------------------------------------------------------ Chemistries  Recent Labs  Lab 01/12/18 0122  01/18/18 0327  NA 137   < > 140  K 4.4   < > 3.5  CL 98   < > 99  CO2 26   < > 29  GLUCOSE 170*   < > 48*  BUN 42*   < > 35*  CREATININE 4.28*   < > 3.91*  CALCIUM 6.4*   < > 6.7*  MG 1.8  --   --   AST 19  --  13*  ALT 20  --  15  ALKPHOS 56  --  50  BILITOT 1.0  --  0.9   < > = values in this interval not displayed.   ------------------------------------------------------------------------------------------------------------------  Cardiac Enzymes Recent Labs  Lab 01/18/18 1012  TROPONINI 0.05*   ------------------------------------------------------------------------------------------------------------------  RADIOLOGY:  Dg Chest Port 1 View  Result Date: 01/18/2018 CLINICAL DATA:  Short of breath  EXAM: PORTABLE CHEST 1 VIEW COMPARISON:  01/17/2018, 01/05/2018, CT 01/06/2018 FINDINGS: Right-sided central venous port tip over the right atrium. Stable cardiomediastinal silhouette. Similar appearance of vascular congestion and moderate pulmonary edema. Continued small bilateral effusions. Findings appear slightly worsened. Aortic atherosclerosis.  No pneumothorax. IMPRESSION: 1. Small bilateral pleural effusions. Vascular congestion with slight interval increase in edema. Electronically Signed   By: Donavan Foil M.D.   On: 01/18/2018 03:54   Dg Chest Port 1 View  Result Date: 01/17/2018 CLINICAL DATA:  Left-sided chest pain. EXAM: PORTABLE CHEST 1 VIEW COMPARISON:  CT 01/06/2018. Chest 01/05/2018. FINDINGS: Shallow inspiration with linear atelectasis in the lung bases. Increased opacity in the left lung may represent asymmetrical edema or pneumonia. Small right pleural effusion, improving since previous study. No pneumothorax. Power port type central venous catheter with tip over the cavoatrial junction region. Calcification of the aorta. IMPRESSION: Shallow inspiration with atelectasis in the lung bases. Increased opacity in the left lung may represent asymmetrical edema or pneumonia. Small right pleural effusion, improving since previous study. Electronically Signed   By: Lucienne Capers M.D.   On: 01/17/2018 06:53     ASSESSMENT AND PLAN:   Mark [name]  is a 72 y.o. male with a known history of end-stage renal disease on hemodialysis, chronic anemia, right lower extremity DVT status post IVC filter, H IT, hypertension, Jerrye Bushy comes to the emergency room with chest tightness and increasing shortness of breath. Patient was found to be short of breath placed on BiPAP  * Acute on chronic hypoxic respiratory failure secondary tovolume overload --Off BiPAP today but continues to be short of breath Discussed with Dr. Candiss Norse of nephrology.  Hemodialysis again today.  * Recent right lower extremity DVT -patient has IVC filter placed. He was on eliquis but discontinue due to worsening anemia. Does not have any active bleeding. -Patient has history of H IT   * Type II diabetes sliding scale insulin for now resume Levemir  * Hypertension continue home meds  * History of bladder cancer and CLL  * Hyperlipidemia on  pravastatin  All the records are reviewed and case discussed with Care Management/Social Worker Management plans discussed with the patient, family and they are in agreement.  CODE STATUS: Partial code  DVT Prophylaxis: SCDs  TOTAL TIME TAKING CARE OF THIS PATIENT: 35 minutes.   POSSIBLE D/C IN 1-2 DAYS, DEPENDING ON CLINICAL CONDITION.  Leia Alf Tajon Moring M.D on 01/18/2018 at 12:15 PM  Between 7am to 6pm - Pager - [phone]  After 6pm go to [email or web address] - password [email or web address] [facility]  Office  [phone]  CC: Primary care physician; No primary care provider on file.  Note: This dictation was prepared with Dragon dictation along with smaller phrase technology. Any transcriptional errors that result from this process are unintentional.

## 2018-01-18 NOTE — Consult Note (Signed)
Pharmacy Antibiotic Note  Mark [name]. is a 72 y.o. male admitted on 01/17/2018 with sepsis.  Pharmacy has been consulted for vancomycin dosing. PMH includes chronic anemia, right lower extremity DVT status post IVC filter 12/31/17, HIT, HTN. Patient normally gets HD on MWF but required an urgent HD session 10/17 where he became hypotensive and febrile. Blood cultures are being drawn.  Plan: Will order Vancomycin 2g LD. Will order vancomycin 1g IV to be given with HD on MWF.   Will order Vancomycin level prior to 3rd HD. Pre-HD target level 15-25 mcg/mL.  Upon admission to ICU patient case examined and discussed with CCM. Decision was made to discontinue cefepime - no doses administered.   Height: 5\' 11"  (180.3 cm) Weight: 181 lb 10.5 oz (82.4 kg) IBW/kg (Calculated) : 75.3  Temp (24hrs), Avg:98.5 F (36.9 C), Min:98 F (36.7 C), Max:99.3 F (37.4 C)  Recent Labs  Lab 01/12/18 0125 01/13/18 0340 01/14/18 0523 01/14/18 1427 01/15/18 0744 01/15/18 0923 01/17/18 0630 01/18/18 0327  WBC  --  2.4* 4.0  --   --  3.7* 4.0 3.6*  CREATININE  --  3.26*  --  6.02* 4.08*  --  4.00* 3.91*  LATICACIDVEN 1.66  --   --   --   --   --   --   --     Estimated Creatinine Clearance: 18.2 mL/min (A) (by C-G formula based on SCr of 3.91 mg/dL (H)).    Allergies  Allergen Reactions  . Acyclovir And Related   . Heparin Other (See Comments)    Excessive bleeding per pt.   . Ibuprofen Other (See Comments)    DUE TO DIALYSIS  . Multivitamin [Centrum] Other (See Comments)    DUE TO DIALYSIS  . Daypro [Oxaprozin] Swelling and Rash    Other reaction(s): Other (See Comments)  . Tape Rash    Antimicrobials this admission: vancomycin 10/17 >>   Microbiology results: 10/17 BCx: NG < 1 day  Thank you for allowing pharmacy to be a part of this patient's care.   Paticia Stack, PharmD Pharmacy Resident  01/18/2018 11:41 AM

## 2018-01-18 NOTE — Consult Note (Signed)
Pharmacy Antibiotic Note  Mark [name]. is a 72 y.o. male admitted on 01/17/2018 with sepsis.  Pharmacy has been consulted for vancomycin dosing. PMH includes chronic anemia, right lower extremity DVT status post IVC filter 12/31/17, HIT, HTN. Patient normally gets HD on MWF but required an urgent HD session today where he became hypotensive and febrile. Blood cultures are being drawn.  Plan: Discontinued Vancomycin per discussion on CCM rounds and with hospitalist team.   Height: 5\' 11"  (180.3 cm) Weight: 181 lb 10.5 oz (82.4 kg) IBW/kg (Calculated) : 75.3  Temp (24hrs), Avg:98.5 F (36.9 C), Min:98.1 F (36.7 C), Max:99.3 F (37.4 C)  Recent Labs  Lab 01/12/18 0125 01/13/18 0340 01/14/18 0523 01/14/18 1427 01/15/18 0744 01/15/18 0923 01/17/18 0630 01/18/18 0327  WBC  --  2.4* 4.0  --   --  3.7* 4.0 3.6*  CREATININE  --  3.26*  --  6.02* 4.08*  --  4.00* 3.91*  LATICACIDVEN 1.66  --   --   --   --   --   --   --     Estimated Creatinine Clearance: 18.2 mL/min (A) (by C-G formula based on SCr of 3.91 mg/dL (H)).    Allergies  Allergen Reactions  . Acyclovir And Related   . Heparin Other (See Comments)    Excessive bleeding per pt.   . Ibuprofen Other (See Comments)    DUE TO DIALYSIS  . Multivitamin [Centrum] Other (See Comments)    DUE TO DIALYSIS  . Daypro [Oxaprozin] Swelling and Rash    Other reaction(s): Other (See Comments)  . Tape Rash    Antimicrobials this admission: vancomycin 10/17 >> 10/18  Microbiology results: 10/17 BCx: NG < 1 day  Thank you for allowing pharmacy to be a part of this patient's care.   Paticia Stack, PharmD Pharmacy Resident  01/18/2018 12:39 PM

## 2018-01-18 NOTE — Progress Notes (Signed)
Patient transported to Dialysis unit via floor bed from room 252 and 2A telemetry unit 40-19. Report given to Sherron Monday, RN on dialysis unit.  Patient to be transferred directly to 2A upon completing dialysis treatment.  Awaiting callback from 2A nurse for report.

## 2018-01-18 NOTE — Progress Notes (Signed)
This note also relates to the following rows which could not be included: Pulse Rate - Cannot attach notes to unvalidated device data Resp - Cannot attach notes to unvalidated device data BP - Cannot attach notes to unvalidated device data  Hd started  

## 2018-01-18 NOTE — Progress Notes (Signed)
[facility], [place] 01/18/18  Subjective:   Patient known to our practice from outpatient dialysis as well as previous admissions.  He returns for acute shortness of breath.  He was recently at [facility] in [place] and was dialyzed for fluid overload.  8 kg were remove over several days.  An IVC filter was placed for DVT.  He had some bleeding issues from the operative site while he was on Eliquis therefore it was stopped.  He presented for pleuritic  chest pain and SOB. Placed on BiPAP. 2.3 L removed with HD on Thursday   Objective:  Vital signs in last 24 hours:  Temp:  [98 F (36.7 C)-99.5 F (37.5 C)] 98.3 F (36.8 C) (10/18 0400) Pulse Rate:  [63-88] 85 (10/18 0800) Resp:  [11-27] 22 (10/18 0800) BP: (98-150)/(35-80) 127/43 (10/18 0800) SpO2:  [86 %-100 %] 95 % (10/18 0800) FiO2 (%):  [40 %] 40 % (10/17 0952) Weight:  [82.4 kg-85.3 kg] 82.4 kg (10/17 1341)  Weight change: -1.7 kg Filed Weights   01/17/18 0952 01/17/18 1010 01/17/18 1341  Weight: 84.8 kg 85.3 kg 82.4 kg    Intake/Output:    Intake/Output Summary (Last 24 hours) at 01/18/2018 0926 Last data filed at 01/17/2018 1745 Gross per 24 hour  Intake 547.12 ml  Output 2302 ml  Net -1754.88 ml     Physical Exam: General: NAD, sitting up in the bed  HEENT  [place] O2, left conjunctival redness, (fom strain on commode)  Neck  supple  Pulm/lungs Mild left bilateral crackles, decreased breath sounds at rt base  CVS/Heart  no rub  Abdomen:   Soft, nontender, nondistended  Extremities:  trace to 1+ edema  Neurologic:  Alert, able to follow commands  Skin:  No acute rashes  Access: Left forearm AVF       Basic Metabolic Panel:  Recent Labs  Lab 01/12/18 0122 01/13/18 0340 01/13/18 2225 01/14/18 1427 01/15/18 0744 01/17/18 0630 01/18/18 0327  NA 137 137  --  138 139 135 140  K 4.4 4.7  --  4.8 3.6 3.7 3.5  CL 98 97*  --  97* 99 95* 99  CO2 26 24  --  24 27 28 29    GLUCOSE 170* 371* 404* 314* 237* 475* 48*  BUN 42* 30*  --  67* 37* 38* 35*  CREATININE 4.28* 3.26*  --  6.02* 4.08* 4.00* 3.91*  CALCIUM 6.4* 6.8*  --  6.4* 7.0* 6.8* 6.7*  MG 1.8  --   --   --   --   --   --   PHOS 3.9  --   --  4.8* 3.6  --   --      CBC: Recent Labs  Lab 01/12/18 0122 01/13/18 0340 01/14/18 0523 01/15/18 0923 01/17/18 0630 01/18/18 0327  WBC 3.4* 2.4* 4.0 3.7* 4.0 3.6*  NEUTROABS 3.0  --   --   --   --  2.5  HGB 7.5* 7.0* 6.5* 8.8* 7.8* 7.4*  HCT 24.4* 22.6* 21.1* 28.1* 24.4* 23.5*  MCV 96.1 96.6 95.5 91.8 91.7 92.5  PLT 108* 86* 104* 72* 51* 54*      Lab Results  Component Value Date   HEPBSAG Negative 12/31/2017   HEPBIGM Negative 08/17/2016      Microbiology:  Recent Results (from the past 240 hour(s))  Culture, blood (Routine x 2)     Status: None   Collection Time: 01/12/18  1:15 AM  Result Value  Ref Range Status   Specimen Description BLOOD PORTA CATH  Final   Special Requests   Final    BOTTLES DRAWN AEROBIC AND ANAEROBIC Blood Culture adequate volume   Culture   Final    NO GROWTH 5 DAYS Performed at [facility], [address], [place], [place] [address]    Report Status 01/17/2018 FINAL  Final  Urine Culture     Status: None   Collection Time: 01/12/18  1:30 AM  Result Value Ref Range Status   Specimen Description URINE, RANDOM  Final   Special Requests NONE  Final   Culture   Final    NO GROWTH Performed at [facility], [place] [address], [place], [place] [address]    Report Status 01/13/2018 FINAL  Final  Culture, blood (Routine x 2)     Status: None   Collection Time: 01/12/18  1:40 AM  Result Value Ref Range Status   Specimen Description BLOOD LEFT ARM  Final   Special Requests   Final    BOTTLES DRAWN AEROBIC AND ANAEROBIC Blood Culture results may not be optimal due to an excessive volume of blood received in culture bottles   Culture   Final    NO GROWTH 5 DAYS Performed at [facility],  [place] [address] [place], [place] [address]    Report Status 01/17/2018 FINAL  Final  Respiratory Panel by PCR     Status: Abnormal   Collection Time: 01/13/18  4:04 PM  Result Value Ref Range Status   Adenovirus NOT DETECTED NOT DETECTED Final   Coronavirus 229E NOT DETECTED NOT DETECTED Final   Coronavirus HKU1 NOT DETECTED NOT DETECTED Final   Coronavirus NL63 NOT DETECTED NOT DETECTED Final   Coronavirus OC43 NOT DETECTED NOT DETECTED Final   Metapneumovirus NOT DETECTED NOT DETECTED Final   Rhinovirus / Enterovirus DETECTED (A) NOT DETECTED Final   Influenza A NOT DETECTED NOT DETECTED Final   Influenza A H1 NOT DETECTED NOT DETECTED Final   Influenza A H1 2009 NOT DETECTED NOT DETECTED Final   Influenza A H3 NOT DETECTED NOT DETECTED Final   Influenza B NOT DETECTED NOT DETECTED Final   Parainfluenza Virus 1 NOT DETECTED NOT DETECTED Final   Parainfluenza Virus 2 NOT DETECTED NOT DETECTED Final   Parainfluenza Virus 3 NOT DETECTED NOT DETECTED Final   Parainfluenza Virus 4 NOT DETECTED NOT DETECTED Final   Respiratory Syncytial Virus NOT DETECTED NOT DETECTED Final   Bordetella pertussis NOT DETECTED NOT DETECTED Final   Chlamydophila pneumoniae NOT DETECTED NOT DETECTED Final   Mycoplasma pneumoniae NOT DETECTED NOT DETECTED Final    Comment: Performed at [facility], [place] [address] [place], [place] [address]  Culture, blood (Routine X 2) w Reflex to ID Panel     Status: None (Preliminary result)   Collection Time: 01/17/18 12:53 PM  Result Value Ref Range Status   Specimen Description BLOOD DRAWN BY DIALYSIS  Final   Special Requests   Final    BOTTLES DRAWN AEROBIC AND ANAEROBIC Blood Culture adequate volume   Culture   Final    NO GROWTH < 24 HOURS Performed at [facility], [address] [place], [place] [address]    Report Status PENDING  Incomplete  Culture, blood (Routine X 2) w Reflex to ID Panel     Status: None (Preliminary result)   Collection  Time: 01/17/18 12:53 PM  Result Value Ref Range Status  Specimen Description BLOOD DRAWN BY DIALYSIS  Final   Special Requests   Final    BOTTLES DRAWN AEROBIC AND ANAEROBIC Blood Culture adequate volume   Culture   Final    NO GROWTH < 24 HOURS Performed at [facility], [address] [place], [place] [address]    Report Status PENDING  Incomplete    Coagulation Studies: No results for input(s): LABPROT, INR in the last 72 hours.  Urinalysis: No results for input(s): COLORURINE, LABSPEC, PHURINE, GLUCOSEU, HGBUR, BILIRUBINUR, KETONESUR, PROTEINUR, UROBILINOGEN, NITRITE, LEUKOCYTESUR in the last 72 hours.  Invalid input(s): APPERANCEUR    Imaging: Dg Chest Port 1 View  Result Date: 01/18/2018 CLINICAL DATA:  Short of breath EXAM: PORTABLE CHEST 1 VIEW COMPARISON:  01/17/2018, 01/05/2018, CT 01/06/2018 FINDINGS: Right-sided central venous port tip over the right atrium. Stable cardiomediastinal silhouette. Similar appearance of vascular congestion and moderate pulmonary edema. Continued small bilateral effusions. Findings appear slightly worsened. Aortic atherosclerosis. No pneumothorax. IMPRESSION: 1. Small bilateral pleural effusions. Vascular congestion with slight interval increase in edema. Electronically Signed   By: Donavan Foil M.D.   On: 01/18/2018 03:54   Dg Chest Port 1 View  Result Date: 01/17/2018 CLINICAL DATA:  Left-sided chest pain. EXAM: PORTABLE CHEST 1 VIEW COMPARISON:  CT 01/06/2018. Chest 01/05/2018. FINDINGS: Shallow inspiration with linear atelectasis in the lung bases. Increased opacity in the left lung may represent asymmetrical edema or pneumonia. Small right pleural effusion, improving since previous study. No pneumothorax. Power port type central venous catheter with tip over the cavoatrial junction region. Calcification of the aorta. IMPRESSION: Shallow inspiration with atelectasis in the lung bases. Increased opacity in the left lung may  represent asymmetrical edema or pneumonia. Small right pleural effusion, improving since previous study. Electronically Signed   By: Lucienne Capers M.D.   On: 01/17/2018 06:53     Medications:   . sodium chloride    . sodium chloride    . vancomycin     . aspirin EC  81 mg Oral Daily  . aspirin  325 mg Oral Daily  . Chlorhexidine Gluconate Cloth  6 each Topical Q0600  . famotidine  20 mg Oral Daily  . hydrocortisone sod succinate (SOLU-CORTEF) inj  25 mg Intravenous Daily  . insulin aspart  0-15 Units Subcutaneous TID WC  . insulin aspart  0-5 Units Subcutaneous QHS  . insulin detemir  18 Units Subcutaneous Daily  . ipratropium-albuterol  3 mL Nebulization Q6H  . midodrine  10 mg Oral Once per day on Mon Wed Fri     Assessment/ Plan:  72 y.o. male with end stage renal disease on hemodialysis, CLL, diabetes mellitus type II, COPD, history of bladder cancer who is admitted to [facility] on 01/17/2018 for shortness of breath.   [facility] MWF [facility] L AVF 85.5kg  1.  End-stage renal disease Urgent hemodialysis requested today for volume overload and acute pulmonary edema. 2. L removed with clinical improvement - Routine HD today - UF Goal 1-1.5 L  2.  Anemia of chronic kidney disease Hemoglobin 7.4. We will continue Epogen with routine hemodialysis on Monday Wednesday Friday  3.  Acute pulmonary edema  Urgent hemodialysis for volume removal, now improved Adjust outpatient EDW  4.  Right leg DVT Patient had GI bleed previous admission.  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  He was taken off of Eliquis recently because of bleeding issues.  He had IVC filter on 12/31/2017    LOS:  [place] 10/18/20199:26 [place], [place]  Note: This note was prepared with Dragon dictation. Any transcription errors are unintentional

## 2018-01-18 NOTE — Progress Notes (Signed)
PT Cancellation Note  Patient Details Name: Mark [name]. MRN: [id number] DOB: Sep 09, 1945   Cancelled Treatment:    Reason Eval/Treat Not Completed: Other (comment). Pt is currently out of room for HD. Unavailable for PT at this time. Will re-attempt next available date.   Jeter Tomey 01/18/2018, 11:59 AM  Greggory Stallion, PT, DPT [phone]

## 2018-01-19 LAB — GLUCOSE, CAPILLARY
GLUCOSE-CAPILLARY: 253 mg/dL — AB (ref 70–99)
GLUCOSE-CAPILLARY: 365 mg/dL — AB (ref 70–99)
Glucose-Capillary: 291 mg/dL — ABNORMAL HIGH (ref 70–99)
Glucose-Capillary: 331 mg/dL — ABNORMAL HIGH (ref 70–99)
Glucose-Capillary: 399 mg/dL — ABNORMAL HIGH (ref 70–99)

## 2018-01-19 MED ORDER — CHLORHEXIDINE GLUCONATE CLOTH 2 % EX PADS
6.0000 | MEDICATED_PAD | Freq: Every day | CUTANEOUS | Status: DC
  Administered 2018-01-19: 6 via TOPICAL

## 2018-01-19 NOTE — Progress Notes (Signed)
This note also relates to the following rows which could not be included: Pulse Rate - Cannot attach notes to unvalidated device data Resp - Cannot attach notes to unvalidated device data  Hd completed  

## 2018-01-19 NOTE — Progress Notes (Signed)
This note also relates to the following rows which could not be included: Pulse Rate - Cannot attach notes to unvalidated device data Resp - Cannot attach notes to unvalidated device data BP - Cannot attach notes to unvalidated device data  Hd started  

## 2018-01-19 NOTE — Progress Notes (Signed)
[facility] [place] NAME: Quinlan Vollmer    MR#:  [id number]  DATE OF BIRTH:  Apr 16, 1945  SUBJECTIVE:  CHIEF COMPLAINT:  No chief complaint on file.  Still has shortness of breath and orthopnea. HD later today  Afebrile  REVIEW OF SYSTEMS:    Review of Systems  Constitutional: Positive for malaise/fatigue. Negative for chills and fever.  HENT: Negative for sore throat.   Eyes: Negative for blurred vision, double vision and pain.  Respiratory: Positive for cough and shortness of breath. Negative for hemoptysis and wheezing.   Cardiovascular: Positive for orthopnea. Negative for chest pain, palpitations and leg swelling.  Gastrointestinal: Negative for abdominal pain, constipation, diarrhea, heartburn, nausea and vomiting.  Genitourinary: Negative for dysuria and hematuria.  Musculoskeletal: Negative for back pain and joint pain.  Skin: Negative for rash.  Neurological: Negative for sensory change, speech change, focal weakness and headaches.  Endo/Heme/Allergies: Does not bruise/bleed easily.  Psychiatric/Behavioral: Negative for depression. The patient is not nervous/anxious.     DRUG ALLERGIES:   Allergies  Allergen Reactions  . Acyclovir And Related   . Heparin Other (See Comments)    Excessive bleeding per pt.   . Ibuprofen Other (See Comments)    DUE TO DIALYSIS  . Multivitamin [Centrum] Other (See Comments)    DUE TO DIALYSIS  . Daypro [Oxaprozin] Swelling and Rash    Other reaction(s): Other (See Comments)  . Tape Rash    VITALS:  Blood pressure (!) 106/43, pulse 91, temperature 99.2 F (37.3 C), temperature source Oral, resp. rate 18, height 5\' 11"  (1.803 m), weight 81.7 kg, SpO2 96 %.  PHYSICAL EXAMINATION:   Physical Exam  GENERAL:  72 y.o.-year-old patient lying in the bed with no acute distress.  EYES: Pupils equal, round, reactive to light and accommodation. No scleral icterus. Extraocular muscles intact.   HEENT: Head atraumatic, normocephalic. Oropharynx and nasopharynx clear.  NECK:  Supple, no jugular venous distention. No thyroid enlargement, no tenderness.  LUNGS:  decreased breath sounds bilaterally CARDIOVASCULAR: S1, S2 normal. No murmurs, rubs, or gallops.  ABDOMEN: Soft, nontender, nondistended. Bowel sounds present. No organomegaly or mass.  EXTREMITIES: No cyanosis, clubbing or edema b/l.    NEUROLOGIC: Cranial nerves II through XII are intact. No focal Motor or sensory deficits b/l.   PSYCHIATRIC: The patient is alert and oriented x 3.  SKIN: No obvious rash, lesion, or ulcer.   LABORATORY PANEL:   CBC Recent Labs  Lab 01/18/18 0327  WBC 3.6*  HGB 7.4*  HCT 23.5*  PLT 54*   ------------------------------------------------------------------------------------------------------------------ Chemistries  Recent Labs  Lab 01/18/18 0327  NA 140  K 3.5  CL 99  CO2 29  GLUCOSE 48*  BUN 35*  CREATININE 3.91*  CALCIUM 6.7*  AST 13*  ALT 15  ALKPHOS 50  BILITOT 0.9   ------------------------------------------------------------------------------------------------------------------  Cardiac Enzymes Recent Labs  Lab 01/18/18 1703  TROPONINI 0.05*   ------------------------------------------------------------------------------------------------------------------  RADIOLOGY:  Dg Chest Port 1 View  Result Date: 01/18/2018 CLINICAL DATA:  Short of breath EXAM: PORTABLE CHEST 1 VIEW COMPARISON:  01/17/2018, 01/05/2018, CT 01/06/2018 FINDINGS: Right-sided central venous port tip over the right atrium. Stable cardiomediastinal silhouette. Similar appearance of vascular congestion and moderate pulmonary edema. Continued small bilateral effusions. Findings appear slightly worsened. Aortic atherosclerosis. No pneumothorax. IMPRESSION: 1. Small bilateral pleural effusions. Vascular congestion with slight interval increase in edema. Electronically Signed   By: Donavan Foil  M.D.   On: 01/18/2018 03:54  ASSESSMENT AND PLAN:   Dejaun Vidrio  is a 72 y.o. male with a known history of end-stage renal disease on hemodialysis, chronic anemia, right lower extremity DVT status post IVC filter, H IT, hypertension, Jerrye Bushy comes to the emergency room with chest tightness and increasing shortness of breath. Patient was found to be short of breath placed on BiPAP  * Acute on chronic hypoxic respiratory failure secondary to volume overload - worsening - Dietary non compliance. - Discussed with Dr. Juleen China of nephrology. - Hemodialysis again today.  * Recent right lower extremity DVT -patient has IVC filter placed. He was on eliquis but discontinue due to worsening anemia. Does not have any active bleeding. -Patient has history of H IT   * Type II diabetes sliding scale insulin for now resume Levemir  * Hypertension continue home meds  * History of bladder cancer and CLL  * Hyperlipidemia on pravastatin  All the records are reviewed and case discussed with Care Management/Social Worker Management plans discussed with the patient, family and they are in agreement.  CODE STATUS: Partial code  DVT Prophylaxis: SCDs  TOTAL TIME TAKING CARE OF THIS PATIENT: 35 minutes.   POSSIBLE D/C IN 1-2 DAYS, DEPENDING ON CLINICAL CONDITION.  Leia Alf Franceen Erisman M.D on 01/19/2018 at 11:08 AM  Between 7am to 6pm - Pager - [phone]  After 6pm go to [email or web address] - password [email or web address] [facility]  Office  [phone]  CC: Primary care physician; No primary care provider on file.  Note: This dictation was prepared with Dragon dictation along with smaller phrase technology. Any transcriptional errors that result from this process are unintentional.

## 2018-01-19 NOTE — Progress Notes (Signed)
PT Cancellation Note  Patient Details Name: Mark [name]. MRN: [id number] DOB: September 24, 1945   Cancelled Treatment:    Reason Eval/Treat Not Completed: Medical issues which prohibited therapy   Chart reviewed.  Pt with continued SOB and scheduled for a dialysis session today.  Will hold session this am and continue as appropriate.   Chesley Noon 01/19/2018, 11:26 AM

## 2018-01-19 NOTE — Progress Notes (Signed)
[place], [place] 01/19/18  Subjective:   Hemodialysis treatment yesterday. UF of 1.5 liters.   Continues to have shortness of breath. On 4.5L O2  Daughter at bedside.    Objective:  Vital signs in last 24 hours:  Temp:  [98 F (36.7 C)-99.3 F (37.4 C)] 99.2 F (37.3 C) (10/19 0807) Pulse Rate:  [72-106] 91 (10/19 0807) Resp:  [13-18] 18 (10/19 0807) BP: (106-161)/(43-71) 106/43 (10/19 0807) SpO2:  [88 %-100 %] 96 % (10/19 0807) Weight:  [81.7 kg-85.4 kg] 81.7 kg (10/19 0340)  Weight change: 0.567 kg Filed Weights   01/17/18 1341 01/18/18 1508 01/19/18 0340  Weight: 82.4 kg 85.4 kg 81.7 kg    Intake/Output:    Intake/Output Summary (Last 24 hours) at 01/19/2018 0949 Last data filed at 01/19/2018 0100 Gross per 24 hour  Intake 240 ml  Output 1500 ml  Net -1260 ml     Physical Exam: General: NAD, sitting up in the bed  HEENT  [place] O2, left conjunctival redness   Neck  supple  Pulm/lungs basilar bilateral crackles   CVS/Heart  no rub  Abdomen:   Soft, nontender, nondistended  Extremities:  trace edema  Neurologic:  Alert, able to follow commands  Skin:  No acute rashes  Access: Left forearm AVF       Basic Metabolic Panel:  Recent Labs  Lab 01/13/18 0340 01/13/18 2225 01/14/18 1427 01/15/18 0744 01/17/18 0630 01/18/18 0327  NA 137  --  138 139 135 140  K 4.7  --  4.8 3.6 3.7 3.5  CL 97*  --  97* 99 95* 99  CO2 24  --  24 27 28 29   GLUCOSE 371* 404* 314* 237* 475* 48*  BUN 30*  --  67* 37* 38* 35*  CREATININE 3.26*  --  6.02* 4.08* 4.00* 3.91*  CALCIUM 6.8*  --  6.4* 7.0* 6.8* 6.7*  PHOS  --   --  4.8* 3.6  --   --      CBC: Recent Labs  Lab 01/13/18 0340 01/14/18 0523 01/15/18 0923 01/17/18 0630 01/18/18 0327  WBC 2.4* 4.0 3.7* 4.0 3.6*  NEUTROABS  --   --   --   --  2.5  HGB 7.0* 6.5* 8.8* 7.8* 7.4*  HCT 22.6* 21.1* 28.1* 24.4* 23.5*  MCV 96.6 95.5 91.8 91.7 92.5  PLT 86* 104* 72* 51* 54*       Lab Results  Component Value Date   HEPBSAG Negative 12/31/2017   HEPBIGM Negative 08/17/2016      Microbiology:  Recent Results (from the past 240 hour(s))  Culture, blood (Routine x 2)     Status: None   Collection Time: 01/12/18  1:15 AM  Result Value Ref Range Status   Specimen Description BLOOD PORTA CATH  Final   Special Requests   Final    BOTTLES DRAWN AEROBIC AND ANAEROBIC Blood Culture adequate volume   Culture   Final    NO GROWTH 5 DAYS Performed at [facility], [address], [place], [place] [address]    Report Status 01/17/2018 FINAL  Final  Urine Culture     Status: None   Collection Time: 01/12/18  1:30 AM  Result Value Ref Range Status   Specimen Description URINE, RANDOM  Final   Special Requests NONE  Final   Culture   Final    NO GROWTH Performed at [facility], [place] [address], [place], [place] [address]  Report Status 01/13/2018 FINAL  Final  Culture, blood (Routine x 2)     Status: None   Collection Time: 01/12/18  1:40 AM  Result Value Ref Range Status   Specimen Description BLOOD LEFT ARM  Final   Special Requests   Final    BOTTLES DRAWN AEROBIC AND ANAEROBIC Blood Culture results may not be optimal due to an excessive volume of blood received in culture bottles   Culture   Final    NO GROWTH 5 DAYS Performed at [facility], [place] [address] [place], [place] [address]    Report Status 01/17/2018 FINAL  Final  Respiratory Panel by PCR     Status: Abnormal   Collection Time: 01/13/18  4:04 PM  Result Value Ref Range Status   Adenovirus NOT DETECTED NOT DETECTED Final   Coronavirus 229E NOT DETECTED NOT DETECTED Final   Coronavirus HKU1 NOT DETECTED NOT DETECTED Final   Coronavirus NL63 NOT DETECTED NOT DETECTED Final   Coronavirus OC43 NOT DETECTED NOT DETECTED Final   Metapneumovirus NOT DETECTED NOT DETECTED Final   Rhinovirus / Enterovirus DETECTED (A) NOT DETECTED Final   Influenza A NOT DETECTED NOT DETECTED  Final   Influenza A H1 NOT DETECTED NOT DETECTED Final   Influenza A H1 2009 NOT DETECTED NOT DETECTED Final   Influenza A H3 NOT DETECTED NOT DETECTED Final   Influenza B NOT DETECTED NOT DETECTED Final   Parainfluenza Virus 1 NOT DETECTED NOT DETECTED Final   Parainfluenza Virus 2 NOT DETECTED NOT DETECTED Final   Parainfluenza Virus 3 NOT DETECTED NOT DETECTED Final   Parainfluenza Virus 4 NOT DETECTED NOT DETECTED Final   Respiratory Syncytial Virus NOT DETECTED NOT DETECTED Final   Bordetella pertussis NOT DETECTED NOT DETECTED Final   Chlamydophila pneumoniae NOT DETECTED NOT DETECTED Final   Mycoplasma pneumoniae NOT DETECTED NOT DETECTED Final    Comment: Performed at [facility], [place] [address], [place], [place] [address]  Culture, blood (Routine X 2) w Reflex to ID Panel     Status: None (Preliminary result)   Collection Time: 01/17/18 12:53 PM  Result Value Ref Range Status   Specimen Description BLOOD DRAWN BY DIALYSIS  Final   Special Requests   Final    BOTTLES DRAWN AEROBIC AND ANAEROBIC Blood Culture adequate volume   Culture   Final    NO GROWTH 2 DAYS Performed at [facility], [address] [place], [place] [address]    Report Status PENDING  Incomplete  Culture, blood (Routine X 2) w Reflex to ID Panel     Status: None (Preliminary result)   Collection Time: 01/17/18 12:53 PM  Result Value Ref Range Status   Specimen Description BLOOD DRAWN BY DIALYSIS  Final   Special Requests   Final    BOTTLES DRAWN AEROBIC AND ANAEROBIC Blood Culture adequate volume   Culture   Final    NO GROWTH 2 DAYS Performed at [facility], [address], [place], [place] [address]    Report Status PENDING  Incomplete    Coagulation Studies: No results for input(s): LABPROT, INR in the last 72 hours.  Urinalysis: No results for input(s): COLORURINE, LABSPEC, PHURINE, GLUCOSEU, HGBUR, BILIRUBINUR, KETONESUR, PROTEINUR, UROBILINOGEN, NITRITE,  LEUKOCYTESUR in the last 72 hours.  Invalid input(s): APPERANCEUR    Imaging: Dg Chest Port 1 View  Result Date: 01/18/2018 CLINICAL DATA:  Short of breath EXAM: PORTABLE CHEST 1 VIEW COMPARISON:  01/17/2018, 01/05/2018, CT  01/06/2018 FINDINGS: Right-sided central venous port tip over the right atrium. Stable cardiomediastinal silhouette. Similar appearance of vascular congestion and moderate pulmonary edema. Continued small bilateral effusions. Findings appear slightly worsened. Aortic atherosclerosis. No pneumothorax. IMPRESSION: 1. Small bilateral pleural effusions. Vascular congestion with slight interval increase in edema. Electronically Signed   By: Donavan Foil M.D.   On: 01/18/2018 03:54     Medications:    . aspirin EC  81 mg Oral Daily  . epoetin (EPOGEN/PROCRIT) injection  4,000 Units Intravenous Q M,W,F-HD  . famotidine  20 mg Oral Daily  . feeding supplement (NEPRO CARB STEADY)  237 mL Oral BID BM  . gabapentin  100 mg Oral BID  . insulin aspart  0-15 Units Subcutaneous TID WC  . insulin aspart  0-5 Units Subcutaneous QHS  . insulin detemir  12 Units Subcutaneous Daily  . ipratropium-albuterol  3 mL Nebulization Q6H  . midodrine  10 mg Oral Once per day on Mon Wed Fri  . multivitamin  1 tablet Oral QHS  . prednisoLONE acetate  1 drop Both Eyes QID  . predniSONE  20 mg Oral Q breakfast  . sodium chloride flush  10-40 mL Intracatheter Q12H  . vitamin C  500 mg Oral BID     Assessment/ Plan:  72 y.o. male with end stage renal disease on hemodialysis, CLL, diabetes mellitus type II, COPD, history of bladder cancer who is admitted to [facility] on 01/17/2018 for shortness of breath.   [facility] MWF [facility] AVF 85.5kg  1.  End-stage renal disease Urgent hemodialysis requested today for volume overload and acute pulmonary edema. - Extra hemodialysis for today. Orders prepared.   2.  Anemia of chronic kidney disease Hemoglobin 7.4. We will continue Epogen with  routine hemodialysis on Monday Wednesday Friday  3.  Acute pulmonary edema  Urgent hemodialysis for volume removal, now improved Adjust outpatient EDW  4.  Right leg DVT Patient had GI bleed previous admission.  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  He was taken off of Eliquis recently because of bleeding issues.  He had IVC filter on 12/31/2017.  Patient is asking about aspirin.     LOS: 2 Lavonia Dana 10/19/20199:49 AM  [place], [place]  Note: This note was prepared with Dragon dictation. Any transcription errors are unintentional

## 2018-01-19 NOTE — Progress Notes (Signed)
Rang out complaining of shortness of breath.  Requesting "breathing treatment".  Lung sounds diminished with tachypnea noted at 24.  Oxygen saturations 90% on 4LNC.  Prn nebulizer given.

## 2018-01-19 NOTE — Plan of Care (Signed)
  Problem: Activity: Goal: Risk for activity intolerance will decrease Outcome: Progressing   Problem: Safety: Goal: Ability to remain free from injury will improve Outcome: Progressing   Problem: Skin Integrity: Goal: Risk for impaired skin integrity will decrease Outcome: Progressing   

## 2018-01-20 LAB — GLUCOSE, CAPILLARY
Glucose-Capillary: 207 mg/dL — ABNORMAL HIGH (ref 70–99)
Glucose-Capillary: 257 mg/dL — ABNORMAL HIGH (ref 70–99)

## 2018-01-20 MED ORDER — HEPARIN SOD (PORK) LOCK FLUSH 100 UNIT/ML IV SOLN
500.0000 [IU] | Freq: Once | INTRAVENOUS | Status: DC
  Filled 2018-01-20: qty 5

## 2018-01-20 NOTE — Plan of Care (Signed)

## 2018-01-20 NOTE — Progress Notes (Signed)
[place], [place] 01/20/18  Subjective:   Hemodialysis treatment yesterday. Sequential only. 2 Liters UF  Large BM this morning  Objective:  Vital signs in last 24 hours:  Temp:  [97.6 F (36.4 C)-98.9 F (37.2 C)] 97.6 F (36.4 C) (10/20 0752) Pulse Rate:  [65-82] 67 (10/20 0752) Resp:  [13-30] 18 (10/20 0752) BP: (107-120)/(44-58) 111/56 (10/20 0752) SpO2:  [93 %-100 %] 100 % (10/20 0850) Weight:  [81.8 kg-82.7 kg] 82.7 kg (10/20 0404)  Weight change: -3.606 kg Filed Weights   01/19/18 0340 01/19/18 1950 01/20/18 0404  Weight: 81.7 kg 81.8 kg 82.7 kg    Intake/Output:    Intake/Output Summary (Last 24 hours) at 01/20/2018 1115 Last data filed at 01/20/2018 0405 Gross per 24 hour  Intake -  Output 2000 ml  Net -2000 ml     Physical Exam: General: NAD, sitting up in the bed  HEENT  [place] O2, left conjunctival redness   Neck  supple  Pulm/lungs basilar bilateral crackles   CVS/Heart  no rub  Abdomen:   Soft, nontender, nondistended  Extremities:  no edema  Neurologic:  Alert, able to follow commands  Skin:  No acute rashes  Access: Left forearm AVF       Basic Metabolic Panel:  Recent Labs  Lab 01/13/18 2225  01/14/18 1427 01/15/18 0744 01/17/18 0630 01/18/18 0327  NA  --   --  138 139 135 140  K  --   --  4.8 3.6 3.7 3.5  CL  --   --  97* 99 95* 99  CO2  --   --  24 27 28 29   GLUCOSE 404*  --  314* 237* 475* 48*  BUN  --   --  67* 37* 38* 35*  CREATININE  --   --  6.02* 4.08* 4.00* 3.91*  CALCIUM  --    < > 6.4* 7.0* 6.8* 6.7*  PHOS  --   --  4.8* 3.6  --   --    < > = values in this interval not displayed.     CBC: Recent Labs  Lab 01/14/18 0523 01/15/18 0923 01/17/18 0630 01/18/18 0327  WBC 4.0 3.7* 4.0 3.6*  NEUTROABS  --   --   --  2.5  HGB 6.5* 8.8* 7.8* 7.4*  HCT 21.1* 28.1* 24.4* 23.5*  MCV 95.5 91.8 91.7 92.5  PLT 104* 72* 51* 54*      Lab Results  Component Value Date   HEPBSAG Negative  12/31/2017   HEPBIGM Negative 08/17/2016      Microbiology:  Recent Results (from the past 240 hour(s))  Culture, blood (Routine x 2)     Status: None   Collection Time: 01/12/18  1:15 AM  Result Value Ref Range Status   Specimen Description BLOOD PORTA CATH  Final   Special Requests   Final    BOTTLES DRAWN AEROBIC AND ANAEROBIC Blood Culture adequate volume   Culture   Final    NO GROWTH 5 DAYS Performed at [facility], [address], [place], [place] [address]    Report Status 01/17/2018 FINAL  Final  Urine Culture     Status: None   Collection Time: 01/12/18  1:30 AM  Result Value Ref Range Status   Specimen Description URINE, RANDOM  Final   Special Requests NONE  Final   Culture   Final    NO GROWTH Performed at [facility], [place]  [address], [place], [place] [address]    Report Status 01/13/2018 FINAL  Final  Culture, blood (Routine x 2)     Status: None   Collection Time: 01/12/18  1:40 AM  Result Value Ref Range Status   Specimen Description BLOOD LEFT ARM  Final   Special Requests   Final    BOTTLES DRAWN AEROBIC AND ANAEROBIC Blood Culture results may not be optimal due to an excessive volume of blood received in culture bottles   Culture   Final    NO GROWTH 5 DAYS Performed at [facility], [place] [address], [place], [place] [address]    Report Status 01/17/2018 FINAL  Final  Respiratory Panel by PCR     Status: Abnormal   Collection Time: 01/13/18  4:04 PM  Result Value Ref Range Status   Adenovirus NOT DETECTED NOT DETECTED Final   Coronavirus 229E NOT DETECTED NOT DETECTED Final   Coronavirus HKU1 NOT DETECTED NOT DETECTED Final   Coronavirus NL63 NOT DETECTED NOT DETECTED Final   Coronavirus OC43 NOT DETECTED NOT DETECTED Final   Metapneumovirus NOT DETECTED NOT DETECTED Final   Rhinovirus / Enterovirus DETECTED (A) NOT DETECTED Final   Influenza A NOT DETECTED NOT DETECTED Final   Influenza A H1 NOT DETECTED NOT DETECTED Final    Influenza A H1 2009 NOT DETECTED NOT DETECTED Final   Influenza A H3 NOT DETECTED NOT DETECTED Final   Influenza B NOT DETECTED NOT DETECTED Final   Parainfluenza Virus 1 NOT DETECTED NOT DETECTED Final   Parainfluenza Virus 2 NOT DETECTED NOT DETECTED Final   Parainfluenza Virus 3 NOT DETECTED NOT DETECTED Final   Parainfluenza Virus 4 NOT DETECTED NOT DETECTED Final   Respiratory Syncytial Virus NOT DETECTED NOT DETECTED Final   Bordetella pertussis NOT DETECTED NOT DETECTED Final   Chlamydophila pneumoniae NOT DETECTED NOT DETECTED Final   Mycoplasma pneumoniae NOT DETECTED NOT DETECTED Final    Comment: Performed at [facility], [place] [address] [place], [place] [address]  Culture, blood (Routine X 2) w Reflex to ID Panel     Status: None (Preliminary result)   Collection Time: 01/17/18 12:53 PM  Result Value Ref Range Status   Specimen Description BLOOD DRAWN BY DIALYSIS  Final   Special Requests   Final    BOTTLES DRAWN AEROBIC AND ANAEROBIC Blood Culture adequate volume   Culture   Final    NO GROWTH 3 DAYS Performed at [facility], [address] [place], [place] [address]    Report Status PENDING  Incomplete  Culture, blood (Routine X 2) w Reflex to ID Panel     Status: None (Preliminary result)   Collection Time: 01/17/18 12:53 PM  Result Value Ref Range Status   Specimen Description BLOOD DRAWN BY DIALYSIS  Final   Special Requests   Final    BOTTLES DRAWN AEROBIC AND ANAEROBIC Blood Culture adequate volume   Culture   Final    NO GROWTH 3 DAYS Performed at [facility], [place] [place], [place] [address]    Report Status PENDING  Incomplete    Coagulation Studies: No results for input(s): LABPROT, INR in the last 72 hours.  Urinalysis: No results for input(s): COLORURINE, LABSPEC, PHURINE, GLUCOSEU, HGBUR, BILIRUBINUR, KETONESUR, PROTEINUR, UROBILINOGEN, NITRITE, LEUKOCYTESUR in the last 72 hours.  Invalid input(s): APPERANCEUR     Imaging: No results found.   Medications:    . aspirin EC  81 mg Oral Daily  .  epoetin (EPOGEN/PROCRIT) injection  4,000 Units Intravenous Q M,W,F-HD  . famotidine  20 mg Oral Daily  . feeding supplement (NEPRO CARB STEADY)  237 mL Oral BID BM  . gabapentin  100 mg Oral BID  . insulin aspart  0-15 Units Subcutaneous TID WC  . insulin aspart  0-5 Units Subcutaneous QHS  . insulin detemir  12 Units Subcutaneous Daily  . ipratropium-albuterol  3 mL Nebulization Q6H  . midodrine  10 mg Oral Once per day on Mon Wed Fri  . multivitamin  1 tablet Oral QHS  . prednisoLONE acetate  1 drop Both Eyes QID  . predniSONE  20 mg Oral Q breakfast  . sodium chloride flush  10-40 mL Intracatheter Q12H  . vitamin C  500 mg Oral BID     Assessment/ Plan:  72 y.o. male with end stage renal disease on hemodialysis, CLL, diabetes mellitus type II, COPD, history of bladder cancer who is admitted to [facility] on 01/17/2018 for shortness of breath.   [facility] MWF [facility] AVF 85.5kg  1.  End-stage renal disease: extra hemodialysis treatment yesterday.  Urgent hemodialysis requested today for volume overload and acute pulmonary edema. - Resume MWF schedule  2.  Anemia of chronic kidney disease Hemoglobin 7.4. We will continue Epogen with routine hemodialysis on MWF  3.  Acute pulmonary edema  Urgent hemodialysis for volume removal, now improved Adjust outpatient EDW  4.  Right leg DVT Patient had GI bleed previous admission.  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  He was taken off of Eliquis recently because of bleeding issues.  He had IVC filter on 12/31/2017.  - Started on Aspirin 81mg    5. Hypotension - midodrine.   6. Secondary Hyperparathyroidism with hypocalcemia - holding binders    LOS: 3 Lavonia Dana 10/20/201911:15 [place], [place]

## 2018-01-20 NOTE — Care Management Note (Signed)
Case Management Note  Patient Details  Name: Mark [name]. MRN: [id number] Date of Birth: 29-Sep-1945  Subjective/Objective:  Patient to be discharged per MD order. Orders in place for home health services. Patient was previously worked up for home health with [facility] but had not been fully established yet per patient. Referral confirmed with Sharyn Lull and she confirmed orders for PT,RN and aide. Patient on chronic O2 through [facility], family to bring tank for d/c. No DME needs. Family to transport.                    Action/Plan:   Expected Discharge Date:  01/20/18               Expected Discharge Plan:  [place]  In-House Referral:     Discharge planning Services  CM Consult  Post Acute Care Choice:  Home Health, Resumption of Svcs/PTA Provider Choice offered to:  Patient  DME Arranged:    DME Agency:     HH Arranged:  RN, PT, Nurse's Aide [place] Agency:  [facility]  Status of Service:  Completed, signed off  If discussed at [place] of Stay Meetings, dates discussed:    Additional Comments:  Latanya Maudlin, RN 01/20/2018, 11:52 AM

## 2018-01-22 LAB — CULTURE, BLOOD (ROUTINE X 2)
CULTURE: NO GROWTH
CULTURE: NO GROWTH
Special Requests: ADEQUATE
Special Requests: ADEQUATE

## 2018-01-23 NOTE — Discharge Summary (Signed)
[facility] [place] NAME: Bernell Sigal    MR#:  [id number]  DATE OF BIRTH:  [birth year]  DATE OF ADMISSION:  01/17/2018 ADMITTING PHYSICIAN: Fritzi Mandes, MD  DATE OF DISCHARGE: 01/20/2018  3:20 PM  PRIMARY CARE PHYSICIAN: No primary care provider on file.   ADMISSION DIAGNOSIS:  Acute pulmonary edema ([place]) [J81.0]  DISCHARGE DIAGNOSIS:  Active Problems:   Respiratory failure with hypoxia (HCC)   SECONDARY DIAGNOSIS:   Past Medical History:  Diagnosis Date  . Anemia   . Arthritis   . Bladder cancer ([place])   . Chronic kidney disease   . Diabetes mellitus without complication ([place])   . Dyspnea    DOE  . ESRD on hemodialysis [facility])    Started dialysis in 2014, gets HD as of 2019 in [place] w/ Dr Rolly Salter on MWF schedule.    [name] GERD (gastroesophageal reflux disease)   . HOH (hard of hearing)   . Hypertension   . IBS (irritable bowel syndrome)   . Lymphoma ([place]) 09/27/2014  . Neuropathy    RIGHT LEG  . Stroke [facility])    TIA     ADMITTING HISTORY  HISTORY OF PRESENT ILLNESS:  Jaggar Benko  is a 72 y.o. male with a known history of end-stage renal disease on hemodialysis, chronic anemia, right lower extremity DVT status post IVC filter, H IT, hypertension, Jerrye Bushy comes to the emergency room with chest tightness and increasing shortness of breath. Patient was found to be short of breath placed on BiPAP. Chest x-ray shows pulmonary edema. No fever. White count normal doubt pneumonia. Dr. Owens Shark discussed with Dr. Candiss Norse. Patient is going to get urgent hemodialysis. Patient also received a port on his right upper chest due to poor stick. Need to rule out PE if shortness of breath does not improve after origin dialysis and ultrafiltration  Patient has right lower extremity DVT. He underwent IVC filter secondary to H IT from heparin. He was started on eliquis however discontinued because of his worsening anemia.   HOSPITAL COURSE:    Patient admitted to stepdown unit on BiPAP from emergency room.  *Acute on chronic hypoxic respiratory failure secondary to fluid overload In setting of end-stage renal disease patient had hemodialysis on 3 consecutive days with significant volume removed.  Patient's breathing returned to normal.  He continues to be on oxygen at home which is at baseline.  Discussed with Dr. Juleen China on day of discharge.  Patient will be discharged home to follow-up with primary care physician and continue hemodialysis as outpatient.  Advised to follow strict renal diet with fluid restriction .  Dietary noncompliance seems to be the cause for fluid overload.  Patient's diabetes and hypertension have remained fairly stable during the hospital stay.  No change to medications.  Discharge home with home health  CONSULTS OBTAINED:  Treatment Team:  Murlean Iba, MD  DRUG ALLERGIES:   Allergies  Allergen Reactions  . Acyclovir And Related   . Heparin Other (See Comments)    Excessive bleeding per pt.   . Ibuprofen Other (See Comments)    DUE TO DIALYSIS  . Multivitamin [Centrum] Other (See Comments)    DUE TO DIALYSIS  . Daypro [Oxaprozin] Swelling and Rash    Other reaction(s): Other (See Comments)  . Tape Rash    DISCHARGE MEDICATIONS:   Allergies as of 01/20/2018      Reactions   Acyclovir And Related    Heparin Other (See Comments)  Excessive bleeding per pt.    Ibuprofen Other (See Comments)   DUE TO DIALYSIS   Multivitamin [centrum] Other (See Comments)   DUE TO DIALYSIS   Daypro [oxaprozin] Swelling, Rash   Other reaction(s): Other (See Comments)   Tape Rash      Medication List    TAKE these medications   albuterol (2.5 MG/3ML) 0.083% nebulizer solution Commonly known as:  PROVENTIL Take 2.5 mg by nebulization every 6 (six) hours as needed for wheezing or shortness of breath.   budesonide-formoterol 160-4.5 MCG/ACT inhaler Commonly known as:  SYMBICORT Inhale 2 puffs into  the lungs 2 (two) times daily.   docusate sodium 100 MG capsule Commonly known as:  COLACE Take 100 mg by mouth daily as needed for mild constipation.   DUREZOL 0.05 % Emul Generic drug:  Difluprednate Place 1 drop into the left eye 2 (two) times daily.   ENSURE PLUS Liqd Take 1 Can by mouth daily.   fluticasone 50 MCG/ACT nasal spray Commonly known as:  FLONASE Place 1 spray into both nostrils daily as needed for allergies.   gabapentin 100 MG capsule Commonly known as:  NEURONTIN Take 1 capsule (100 mg total) by mouth 3 (three) times daily. What changed:  when to take this   glimepiride 4 MG tablet Commonly known as:  AMARYL Take 4 mg by mouth at bedtime. Prn glucose levels   guaiFENesin 600 MG 12 hr tablet Commonly known as:  MUCINEX Take 1 tablet (600 mg total) by mouth 2 (two) times daily.   guaiFENesin-dextromethorphan 100-10 MG/5ML syrup Commonly known as:  ROBITUSSIN DM Take 15 mLs by mouth every 4 (four) hours as needed for cough.   insulin aspart 100 UNIT/ML injection Commonly known as:  novoLOG Inject 4 Units into the skin 3 (three) times daily with meals.   insulin detemir 100 UNIT/ML injection Commonly known as:  LEVEMIR Inject 0.14 mLs (14 Units total) into the skin daily.   ipratropium-albuterol 0.5-2.5 (3) MG/3ML Soln Commonly known as:  DUONEB Take 3 mLs by nebulization 4 (four) times daily.   lovastatin 20 MG tablet Commonly known as:  MEVACOR Take 1 tablet by mouth daily.   metoCLOPramide 5 MG tablet Commonly known as:  REGLAN Take 5 mg by mouth 3 (three) times daily before meals. Patient takes 1 tab with each meal.   midodrine 10 MG tablet Commonly known as:  PROAMATINE Take 1 tablet (10 mg total) by mouth every dialysis (MWF).   multivitamin Tabs tablet Take 1 tablet by mouth at bedtime.   nystatin cream Commonly known as:  MYCOSTATIN Apply 1 application topically 2 (two) times daily as needed (irritation).   polyethylene glycol  packet Commonly known as:  MIRALAX / GLYCOLAX Take 17 g by mouth daily.   predniSONE 10 MG (21) Tbpk tablet Commonly known as:  STERAPRED UNI-PAK 21 TAB Start at 60mg  taper by 10mg  until complete   REFRESH 1.4-0.6 % Soln Generic drug:  Polyvinyl Alcohol-Povidone PF Apply 1 drop to eye 3 (three) times daily as needed (Dry eyes).   silver sulfADIAZINE 1 % cream Commonly known as:  SILVADENE APPLY 1 APPLICATION TOPICALY DAILY What changed:  See the new instructions.   sodium chloride 0.65 % Soln nasal spray Commonly known as:  OCEAN Place 1 spray into both nostrils as needed for congestion.   torsemide 100 MG tablet Commonly known as:  DEMADEX Take 1 tablet (100 mg total) by mouth daily. Please take on Non dialysis days- Tues, Thurs,  Saturday and sunday       Today   VITAL SIGNS:  Blood pressure (!) 111/56, pulse 67, temperature 97.6 F (36.4 C), temperature source Oral, resp. rate 18, height 5\' 11"  (1.803 m), weight 82.7 kg, SpO2 93 %.  I/O:  No intake or output data in the 24 hours ending 01/23/18 1944  PHYSICAL EXAMINATION:  Physical Exam  GENERAL:  72 y.o.-year-old patient lying in the bed with no acute distress.  LUNGS: Normal breath sounds bilaterally, no wheezing, rales,rhonchi or crepitation. No use of accessory muscles of respiration.  CARDIOVASCULAR: S1, S2 normal. No murmurs, rubs, or gallops.  ABDOMEN: Soft, non-tender, non-distended. Bowel sounds present. No organomegaly or mass.  NEUROLOGIC: Moves all 4 extremities. PSYCHIATRIC: The patient is alert and oriented x 3.  SKIN: No obvious rash, lesion, or ulcer.   DATA REVIEW:   CBC Recent Labs  Lab 01/18/18 0327  WBC 3.6*  HGB 7.4*  HCT 23.5*  PLT 54*    Chemistries  Recent Labs  Lab 01/18/18 0327  NA 140  K 3.5  CL 99  CO2 29  GLUCOSE 48*  BUN 35*  CREATININE 3.91*  CALCIUM 6.7*  AST 13*  ALT 15  ALKPHOS 50  BILITOT 0.9    Cardiac Enzymes Recent Labs  Lab 01/18/18 1703   TROPONINI 0.05*    Microbiology Results  Results for orders placed or performed during the hospital encounter of 01/17/18  Culture, blood (Routine X 2) w Reflex to ID Panel     Status: None   Collection Time: 01/17/18 12:53 PM  Result Value Ref Range Status   Specimen Description BLOOD DRAWN BY DIALYSIS  Final   Special Requests   Final    BOTTLES DRAWN AEROBIC AND ANAEROBIC Blood Culture adequate volume   Culture   Final    NO GROWTH 5 DAYS Performed at [facility], [address] [place], [place] [address]    Report Status 01/22/2018 FINAL  Final  Culture, blood (Routine X 2) w Reflex to ID Panel     Status: None   Collection Time: 01/17/18 12:53 PM  Result Value Ref Range Status   Specimen Description BLOOD DRAWN BY DIALYSIS  Final   Special Requests   Final    BOTTLES DRAWN AEROBIC AND ANAEROBIC Blood Culture adequate volume   Culture   Final    NO GROWTH 5 DAYS Performed at [facility], [address], [place], [place] [address]    Report Status 01/22/2018 FINAL  Final    RADIOLOGY:  No results found.  Follow up with PCP in 1 week.  Management plans discussed with the patient, family and they are in agreement.  CODE STATUS:  Code Status History    Date Active Date Inactive Code Status Order ID Comments User Context   01/17/2018 0952 01/20/2018 1906 Partial Code [id number]  Fritzi Mandes, MD Inpatient   01/12/2018 1017 01/15/2018 2305 Full Code [id number]  Bonnell Public, MD Inpatient   12/31/2017 0440 01/08/2018 2329 DNR [id number]  Arta Silence, MD Inpatient   12/31/2017 0212 12/31/2017 0440 DNR [id number]  Arta Silence, MD ED   12/25/2017 0514 12/27/2017 1651 Partial Code [id number]  Harrie Foreman, MD ED   12/18/2017 1441 12/23/2017 1907 Partial Code [id number]  Asencion Gowda, NP Inpatient   01/15/2017 0423 01/18/2017 2120 Full Code [id number]  Saundra Shelling, MD Inpatient   10/20/2016 1627 10/22/2016 1817 Full Code [id number]   Lance Coon, MD Inpatient    Questions for  Most Recent Historical Code Status (Order [id number])    Question Answer Comment   In the event of cardiac or respiratory ARREST: Initiate Code Blue, Call Rapid Response Yes    In the event of cardiac or respiratory ARREST: Perform CPR Yes    In the event of cardiac or respiratory ARREST: Perform Intubation/Mechanical Ventilation No    In the event of cardiac or respiratory ARREST: Use NIPPV/BiPAp only if indicated Yes    In the event of cardiac or respiratory ARREST: Administer ACLS medications if indicated Yes    In the event of cardiac or respiratory ARREST: Perform Defibrillation or Cardioversion if indicated Yes         Advance Directive Documentation     Most Recent Value  Type of Advance Directive  Healthcare Power of Attorney  Pre-existing out of facility DNR order (yellow form or pink MOST form)  -  "MOST" Form in Place?  -      TOTAL TIME TAKING CARE OF THIS PATIENT ON DAY OF DISCHARGE: more than 30 minutes.   Leia Alf Sabastian Raimondi M.D on 01/23/2018 at 7:44 PM  Between 7am to 6pm - Pager - [phone]  After 6pm go to [email or web address] - password [email or web address] [facility]  Office  [phone]  CC: Primary care physician; No primary care provider on file.  Note: This dictation was prepared with Dragon dictation along with smaller phrase technology. Any transcriptional errors that result from this process are unintentional.

## 2018-01-30 ENCOUNTER — Telehealth: Payer: Self-pay | Admitting: Internal Medicine

## 2018-01-30 ENCOUNTER — Other Ambulatory Visit: Payer: Self-pay | Admitting: Internal Medicine

## 2018-01-30 DIAGNOSIS — N186 End stage renal disease: Principal | ICD-10-CM

## 2018-01-30 DIAGNOSIS — D631 Anemia in chronic kidney disease: Secondary | ICD-10-CM

## 2018-01-30 DIAGNOSIS — Z992 Dependence on renal dialysis: Principal | ICD-10-CM

## 2018-01-30 NOTE — Telephone Encounter (Signed)
As per nephrology/Dr. Kolluru-hemoglobin 5.5 with dialysis.  Patient clinically stable.  Plan 2 units of PRBC transfusion on 10/31.  Discussed with blood bank; check CBC hold tube in the morning of 10/31.

## 2018-01-31 ENCOUNTER — Other Ambulatory Visit: Payer: Self-pay

## 2018-01-31 ENCOUNTER — Inpatient Hospital Stay: Payer: Medicare HMO | Attending: Internal Medicine

## 2018-01-31 ENCOUNTER — Inpatient Hospital Stay: Payer: Medicare HMO

## 2018-01-31 VITALS — BP 137/68 | HR 67 | Temp 96.8°F | Resp 20

## 2018-01-31 DIAGNOSIS — N186 End stage renal disease: Secondary | ICD-10-CM

## 2018-01-31 DIAGNOSIS — D649 Anemia, unspecified: Secondary | ICD-10-CM | POA: Insufficient documentation

## 2018-01-31 DIAGNOSIS — Z992 Dependence on renal dialysis: Secondary | ICD-10-CM

## 2018-01-31 DIAGNOSIS — Z95828 Presence of other vascular implants and grafts: Secondary | ICD-10-CM

## 2018-01-31 DIAGNOSIS — D631 Anemia in chronic kidney disease: Secondary | ICD-10-CM

## 2018-01-31 DIAGNOSIS — C9110 Chronic lymphocytic leukemia of B-cell type not having achieved remission: Secondary | ICD-10-CM | POA: Insufficient documentation

## 2018-01-31 LAB — SAMPLE TO BLOOD BANK

## 2018-01-31 LAB — CBC WITH DIFFERENTIAL/PLATELET
ABS IMMATURE GRANULOCYTES: 0.08 10*3/uL — AB (ref 0.00–0.07)
BASOS PCT: 0 %
Basophils Absolute: 0 10*3/uL (ref 0.0–0.1)
Eosinophils Absolute: 0 10*3/uL (ref 0.0–0.5)
Eosinophils Relative: 0 %
HCT: 17.9 % — ABNORMAL LOW (ref 39.0–52.0)
Hemoglobin: 5.4 g/dL — ABNORMAL LOW (ref 13.0–17.0)
IMMATURE GRANULOCYTES: 2 %
Lymphocytes Relative: 16 %
Lymphs Abs: 0.7 10*3/uL (ref 0.7–4.0)
MCH: 29.3 pg (ref 26.0–34.0)
MCHC: 30.2 g/dL (ref 30.0–36.0)
MCV: 97.3 fL (ref 80.0–100.0)
Monocytes Absolute: 0.1 10*3/uL (ref 0.1–1.0)
Monocytes Relative: 1 %
NEUTROS ABS: 3.9 10*3/uL (ref 1.7–7.7)
NEUTROS PCT: 81 %
PLATELETS: 127 10*3/uL — AB (ref 150–400)
RBC: 1.84 MIL/uL — AB (ref 4.22–5.81)
RDW: 20.1 % — ABNORMAL HIGH (ref 11.5–15.5)
WBC: 4.8 10*3/uL (ref 4.0–10.5)
nRBC: 1.1 % — ABNORMAL HIGH (ref 0.0–0.2)

## 2018-01-31 LAB — PREPARE RBC (CROSSMATCH)

## 2018-01-31 MED ORDER — SODIUM CHLORIDE 0.9% IV SOLUTION
250.0000 mL | Freq: Once | INTRAVENOUS | Status: AC
  Administered 2018-01-31: 250 mL via INTRAVENOUS
  Filled 2018-01-31: qty 250

## 2018-01-31 MED ORDER — ACETAMINOPHEN 325 MG PO TABS
650.0000 mg | ORAL_TABLET | Freq: Once | ORAL | Status: AC
  Administered 2018-01-31: 650 mg via ORAL
  Filled 2018-01-31: qty 2

## 2018-01-31 MED ORDER — HEPARIN SOD (PORK) LOCK FLUSH 100 UNIT/ML IV SOLN
500.0000 [IU] | Freq: Once | INTRAVENOUS | Status: AC
  Administered 2018-01-31: 500 [IU] via INTRAVENOUS
  Filled 2018-01-31: qty 5

## 2018-01-31 MED ORDER — DIPHENHYDRAMINE HCL 25 MG PO CAPS
25.0000 mg | ORAL_CAPSULE | Freq: Once | ORAL | Status: AC
  Administered 2018-01-31: 25 mg via ORAL
  Filled 2018-01-31: qty 1

## 2018-01-31 MED ORDER — SODIUM CHLORIDE 0.9% FLUSH
10.0000 mL | Freq: Once | INTRAVENOUS | Status: AC
  Administered 2018-01-31: 10 mL via INTRAVENOUS
  Filled 2018-01-31: qty 10

## 2018-02-01 LAB — TYPE AND SCREEN
ABO/RH(D): B POS
ANTIBODY SCREEN: NEGATIVE
Unit division: 0
Unit division: 0

## 2018-02-01 LAB — BPAM RBC
BLOOD PRODUCT EXPIRATION DATE: 201911212359
Blood Product Expiration Date: 201911182359
ISSUE DATE / TIME: 201910311044
ISSUE DATE / TIME: 201910311220
UNIT TYPE AND RH: 7300
Unit Type and Rh: 7300

## 2018-02-07 ENCOUNTER — Ambulatory Visit (INDEPENDENT_AMBULATORY_CARE_PROVIDER_SITE_OTHER): Payer: Medicare HMO | Admitting: Urology

## 2018-02-07 DIAGNOSIS — C679 Malignant neoplasm of bladder, unspecified: Secondary | ICD-10-CM | POA: Diagnosis not present

## 2018-02-07 NOTE — Progress Notes (Signed)
Bladder cancer surveillance note  INDICATION Hx CIS 2015  HISTORY  Mark [name]. is a 72 y.o. male with numerous comorbidities and including end-stage renal disease on dialysis, diabetes, and history of bladder cancer.  Initial Diagnosis of Bladder  Year: 2015 by Dr. Elnoria Howard Pathology: CIS  Recurrent Bladder Cancer Diagnosis None  Treatments for Bladder Cancer Cystoscopy, bladder biopsy and fulguration May 2015 Induction BCG  AUA Risk Category High  Cystoscopy Procedure Note: After informed consent and discussion of the procedure and its risks, [name]. was positioned and prepped in the standard fashion. Cystoscopy was performed with the a flexible cystoscope. The urethra, bladder neck and entire bladder was visualized in a standard fashion, and no concerning lesions were seen. The ureteral orifices were visualized in their normal location and orientation.   Plan: Repeat cystoscopy in 1 year Consider upper tract imaging at that time  Mark Madrid, MD 02/07/2018

## 2018-02-08 ENCOUNTER — Other Ambulatory Visit: Payer: Medicare HMO

## 2018-02-12 ENCOUNTER — Ambulatory Visit: Payer: Medicare HMO | Admitting: Podiatry

## 2018-02-12 ENCOUNTER — Encounter: Payer: Self-pay | Admitting: Podiatry

## 2018-02-12 DIAGNOSIS — E0842 Diabetes mellitus due to underlying condition with diabetic polyneuropathy: Secondary | ICD-10-CM | POA: Diagnosis not present

## 2018-02-12 DIAGNOSIS — M79676 Pain in unspecified toe(s): Secondary | ICD-10-CM

## 2018-02-12 DIAGNOSIS — B351 Tinea unguium: Secondary | ICD-10-CM

## 2018-02-12 DIAGNOSIS — L989 Disorder of the skin and subcutaneous tissue, unspecified: Secondary | ICD-10-CM

## 2018-02-12 NOTE — Progress Notes (Signed)
    Subjective: Patient is a 72 y.o. male presenting to the office today with a chief complaint of a painful callus lesion to the sub-fifth MPJ that has been present for several months. Walking and bearing weight increases the pain. He has not had any recent treatment.   Patient also complains of elongated, thickened nails that cause pain while ambulating in shoes. He is unable to trim his own nails. Patient presents today for further treatment and evaluation.  Past Medical History:  Diagnosis Date  . Anemia   . Arthritis   . Bladder cancer ([place])   . Chronic kidney disease   . Diabetes mellitus without complication ([place])   . Dyspnea    DOE  . ESRD on hemodialysis [facility])    Started dialysis in 2014, gets HD as of 2019 in [place] w/ Dr Rolly Salter on MWF schedule.    [name] GERD (gastroesophageal reflux disease)   . HOH (hard of hearing)   . Hypertension   . IBS (irritable bowel syndrome)   . Lymphoma ([place]) 09/27/2014  . Neuropathy    RIGHT LEG  . Stroke [facility])    TIA    Objective:  Physical Exam General: Alert and oriented x3 in no acute distress  Dermatology: Hyperkeratotic lesion present on the left sub-fifth MPJ. Pain on palpation with a central nucleated core noted. Skin is warm, dry and supple bilateral lower extremities. Negative for open lesions or macerations. Nails are tender, long, thickened and dystrophic with subungual debris, consistent with onychomycosis, 1-5 bilateral. No signs of infection noted.  Vascular: Palpable pedal pulses bilaterally. No edema or erythema noted. Capillary refill within normal limits.  Neurological: Epicritic and protective threshold diminished bilaterally.   Musculoskeletal Exam: Pain on palpation at the keratotic lesion noted. Range of motion within normal limits bilateral. Muscle strength 5/5 in all groups bilateral.  Assessment: 1. Onychodystrophic nails 1-5 bilateral with hyperkeratosis of nails.  2. Onychomycosis of nail due to dermatophyte  bilateral 3. Pre-ulcerative callus lesion noted to the sub-fifth MPJ of the left foot   Plan of Care:  1. Patient evaluated. 2. Excisional debridement of keratoic lesion using a chisel blade was performed without incident.  3. Dressed with light dressing. 4. Mechanical debridement of nails 1-5 bilaterally performed using a nail nipper. Filed with dremel without incident.  5. DM shoes dispensed today with insoles x 3 pair.  6. Patient is to return to the clinic as needed.   Edrick Kins, DPM [facility]  Dr. Edrick Kins, [address]                                        [place], [place] [address]                Office [phone]  Fax [phone]

## 2018-02-15 ENCOUNTER — Other Ambulatory Visit: Payer: Self-pay | Admitting: *Deleted

## 2018-02-15 ENCOUNTER — Telehealth: Payer: Self-pay | Admitting: *Deleted

## 2018-02-15 DIAGNOSIS — D649 Anemia, unspecified: Secondary | ICD-10-CM

## 2018-02-15 NOTE — Telephone Encounter (Signed)
[facility] called and reports that patient has hgb 6.3 from 11/11 and needs to set up for a blood transfusion. Please advise

## 2018-02-15 NOTE — Telephone Encounter (Signed)
Patient will require 2 unit of blood. Dialysis is on MWF per dialysis nurse.  The blood transfusion will be performed in the SDS dept due to chair availability in cancer center on Tuesday. Patient is not able to be typed/cross until Tuesday morning. Hand off of care provided to [facility] who approved blood transfusion in SDS. Pt will need to arrive at 8 am in [place] on Tuesday 11/19.  I contacted the patient's daughter - Mark [name]. She is aware of the plan of care and gave verbal understanding.  Blood orders entered, but pending RN release.

## 2018-02-19 ENCOUNTER — Telehealth: Payer: Self-pay | Admitting: *Deleted

## 2018-02-19 ENCOUNTER — Ambulatory Visit
Admission: RE | Admit: 2018-02-19 | Discharge: 2018-02-19 | Disposition: A | Discharge status: Home / Self Care | Payer: Medicare HMO | Source: Ambulatory Visit | Attending: Internal Medicine | Admitting: Internal Medicine

## 2018-02-19 DIAGNOSIS — Z79899 Other long term (current) drug therapy: Secondary | ICD-10-CM | POA: Insufficient documentation

## 2018-02-19 DIAGNOSIS — D649 Anemia, unspecified: Secondary | ICD-10-CM

## 2018-02-19 DIAGNOSIS — Z7951 Long term (current) use of inhaled steroids: Secondary | ICD-10-CM | POA: Diagnosis not present

## 2018-02-19 DIAGNOSIS — Z794 Long term (current) use of insulin: Secondary | ICD-10-CM | POA: Insufficient documentation

## 2018-02-19 LAB — GLUCOSE, CAPILLARY
Glucose-Capillary: 346 mg/dL — ABNORMAL HIGH (ref 70–99)
Glucose-Capillary: 237 mg/dL — ABNORMAL HIGH (ref 70–99)

## 2018-02-19 LAB — HEMOGLOBIN: Hemoglobin: 6 g/dL — ABNORMAL LOW (ref 13.0–17.0)

## 2018-02-19 LAB — PREPARE RBC (CROSSMATCH)

## 2018-02-19 MED ORDER — SODIUM CHLORIDE 0.9% IV SOLUTION
250.0000 mL | Freq: Once | INTRAVENOUS | Status: AC
  Administered 2018-02-19: 1000 mL via INTRAVENOUS

## 2018-02-19 MED ORDER — HEPARIN SODIUM (PORCINE) 5000 UNIT/ML IJ SOLN
INTRAMUSCULAR | Status: AC
  Administered 2018-02-19: 5000 [IU]
  Filled 2018-02-19: qty 1

## 2018-02-19 MED ORDER — DIPHENHYDRAMINE HCL 25 MG PO CAPS
25.0000 mg | ORAL_CAPSULE | Freq: Once | ORAL | Status: AC
  Administered 2018-02-19: 25 mg via ORAL

## 2018-02-19 MED ORDER — DIPHENHYDRAMINE HCL 25 MG PO CAPS
ORAL_CAPSULE | ORAL | Status: AC
  Administered 2018-02-19: 25 mg via ORAL
  Filled 2018-02-19: qty 1

## 2018-02-19 MED ORDER — ACETAMINOPHEN 325 MG PO TABS
650.0000 mg | ORAL_TABLET | Freq: Once | ORAL | Status: AC
  Administered 2018-02-19: 650 mg via ORAL

## 2018-02-19 MED ORDER — ACETAMINOPHEN 325 MG PO TABS
ORAL_TABLET | ORAL | Status: AC
  Administered 2018-02-19: 650 mg via ORAL
  Filled 2018-02-19: qty 2

## 2018-02-19 MED ORDER — SODIUM CHLORIDE 0.9% FLUSH
10.0000 mL | INTRAVENOUS | Status: AC | PRN
  Administered 2018-02-19: 10 mL

## 2018-02-19 MED ORDER — HEPARIN SOD (PORK) LOCK FLUSH 100 UNIT/ML IV SOLN: 500.0000 [IU] | Freq: Every day | INTRAVENOUS | Status: DC | PRN

## 2018-02-19 MED ORDER — SODIUM CHLORIDE FLUSH 0.9 % IV SOLN
INTRAVENOUS | Status: AC
  Administered 2018-02-19: 10 mL
  Filled 2018-02-19: qty 10

## 2018-02-19 NOTE — OR Nursing (Signed)
Patient coughing occasionally while laying in bed. Nonproductive cough for the most part. Denies any complaints of pain, fever, headache, nausea, etc.

## 2018-02-19 NOTE — Telephone Encounter (Signed)
Per Dr. Jacinto Reap - no changes in any additional orders. Thank the nurse for making [place] aware.

## 2018-02-19 NOTE — Telephone Encounter (Signed)
Patient there for blood transfusion this morning and his blood sugar is 343 this morning. She wanted you to be aware. Any new orders?

## 2018-02-19 NOTE — Telephone Encounter (Signed)
Mark [name] informed no changes/ orders at this time

## 2018-02-20 LAB — BPAM RBC
BLOOD PRODUCT EXPIRATION DATE: 201912092359
BLOOD PRODUCT EXPIRATION DATE: 201912092359
ISSUE DATE / TIME: 201911191041
ISSUE DATE / TIME: 201911191308
UNIT TYPE AND RH: 7300
Unit Type and Rh: 7300

## 2018-02-20 LAB — TYPE AND SCREEN
ABO/RH(D): B POS
Antibody Screen: NEGATIVE
UNIT DIVISION: 0
Unit division: 0

## 2018-02-21 ENCOUNTER — Ambulatory Visit
Admission: RE | Admit: 2018-02-21 | Discharge: 2018-02-21 | Disposition: A | Discharge status: Home / Self Care | Payer: Medicare HMO | Source: Ambulatory Visit | Attending: Student | Admitting: Student

## 2018-02-21 ENCOUNTER — Other Ambulatory Visit: Payer: Self-pay | Admitting: Student

## 2018-02-21 DIAGNOSIS — J90 Pleural effusion, not elsewhere classified: Secondary | ICD-10-CM | POA: Insufficient documentation

## 2018-02-21 DIAGNOSIS — R590 Localized enlarged lymph nodes: Secondary | ICD-10-CM | POA: Diagnosis not present

## 2018-02-21 DIAGNOSIS — R05 Cough: Secondary | ICD-10-CM | POA: Diagnosis present

## 2018-02-21 DIAGNOSIS — R0602 Shortness of breath: Secondary | ICD-10-CM | POA: Diagnosis present

## 2018-02-21 DIAGNOSIS — R059 Cough, unspecified: Secondary | ICD-10-CM

## 2018-03-08 ENCOUNTER — Emergency Department: Payer: Medicare HMO

## 2018-03-08 ENCOUNTER — Other Ambulatory Visit: Payer: Self-pay

## 2018-03-08 ENCOUNTER — Emergency Department
Admission: EM | Admit: 2018-03-08 | Discharge: 2018-03-09 | Disposition: A | Discharge status: Home / Self Care | Payer: Medicare HMO | Attending: Emergency Medicine | Admitting: Emergency Medicine

## 2018-03-08 DIAGNOSIS — Z8572 Personal history of non-Hodgkin lymphomas: Secondary | ICD-10-CM | POA: Insufficient documentation

## 2018-03-08 DIAGNOSIS — Z794 Long term (current) use of insulin: Secondary | ICD-10-CM | POA: Diagnosis not present

## 2018-03-08 DIAGNOSIS — I120 Hypertensive chronic kidney disease with stage 5 chronic kidney disease or end stage renal disease: Secondary | ICD-10-CM | POA: Insufficient documentation

## 2018-03-08 DIAGNOSIS — Z992 Dependence on renal dialysis: Secondary | ICD-10-CM | POA: Insufficient documentation

## 2018-03-08 DIAGNOSIS — Z9221 Personal history of antineoplastic chemotherapy: Secondary | ICD-10-CM | POA: Diagnosis not present

## 2018-03-08 DIAGNOSIS — R0602 Shortness of breath: Secondary | ICD-10-CM | POA: Insufficient documentation

## 2018-03-08 DIAGNOSIS — Z87891 Personal history of nicotine dependence: Secondary | ICD-10-CM | POA: Diagnosis not present

## 2018-03-08 DIAGNOSIS — Z79899 Other long term (current) drug therapy: Secondary | ICD-10-CM | POA: Diagnosis not present

## 2018-03-08 DIAGNOSIS — N186 End stage renal disease: Secondary | ICD-10-CM | POA: Insufficient documentation

## 2018-03-08 DIAGNOSIS — Z8551 Personal history of malignant neoplasm of bladder: Secondary | ICD-10-CM | POA: Diagnosis not present

## 2018-03-08 DIAGNOSIS — E1122 Type 2 diabetes mellitus with diabetic chronic kidney disease: Secondary | ICD-10-CM | POA: Diagnosis not present

## 2018-03-08 DIAGNOSIS — E1140 Type 2 diabetes mellitus with diabetic neuropathy, unspecified: Secondary | ICD-10-CM | POA: Diagnosis not present

## 2018-03-08 LAB — BASIC METABOLIC PANEL
ANION GAP: 13 (ref 5–15)
BUN: 19 mg/dL (ref 8–23)
CO2: 26 mmol/L (ref 22–32)
Calcium: 8.3 mg/dL — ABNORMAL LOW (ref 8.9–10.3)
Chloride: 98 mmol/L (ref 98–111)
Creatinine, Ser: 3.53 mg/dL — ABNORMAL HIGH (ref 0.61–1.24)
GFR calc Af Amer: 19 mL/min — ABNORMAL LOW (ref >60)
GFR, EST NON AFRICAN AMERICAN: 16 mL/min — AB (ref >60)
GLUCOSE: 331 mg/dL — AB (ref 70–99)
Potassium: 3 mmol/L — ABNORMAL LOW (ref 3.5–5.1)
Sodium: 137 mmol/L (ref 135–145)

## 2018-03-08 LAB — BLOOD GAS, VENOUS
Acid-Base Excess: 6.6 mmol/L — ABNORMAL HIGH (ref 0.0–2.0)
Bicarbonate: 27.8 mmol/L (ref 20.0–28.0)
O2 SAT: 76.2 %
Patient temperature: 37
pCO2, Ven: 29 mmHg — ABNORMAL LOW (ref 44.0–60.0)
pH, Ven: 7.59 — ABNORMAL HIGH (ref 7.250–7.430)
pO2, Ven: 33 mmHg (ref 32.0–45.0)

## 2018-03-08 LAB — CBC
HCT: 29 % — ABNORMAL LOW (ref 39.0–52.0)
Hemoglobin: 9 g/dL — ABNORMAL LOW (ref 13.0–17.0)
MCH: 28.8 pg (ref 26.0–34.0)
MCHC: 31 g/dL (ref 30.0–36.0)
MCV: 92.7 fL (ref 80.0–100.0)
Platelets: 94 10*3/uL — ABNORMAL LOW (ref 150–400)
RBC: 3.13 MIL/uL — AB (ref 4.22–5.81)
RDW: 19 % — ABNORMAL HIGH (ref 11.5–15.5)
WBC: 4.6 10*3/uL (ref 4.0–10.5)
nRBC: 0 % (ref 0.0–0.2)

## 2018-03-08 LAB — BRAIN NATRIURETIC PEPTIDE: B Natriuretic Peptide: 316 pg/mL — ABNORMAL HIGH (ref 0.0–100.0)

## 2018-03-08 LAB — GLUCOSE, CAPILLARY: Glucose-Capillary: 306 mg/dL — ABNORMAL HIGH (ref 70–99)

## 2018-03-08 LAB — TROPONIN I: TROPONIN I: 0.03 ng/mL — AB (ref <0.03)

## 2018-03-08 MED ORDER — IPRATROPIUM-ALBUTEROL 0.5-2.5 (3) MG/3ML IN SOLN
3.0000 mL | Freq: Once | RESPIRATORY_TRACT | Status: AC
  Administered 2018-03-08: 3 mL via RESPIRATORY_TRACT

## 2018-03-08 MED ORDER — LORAZEPAM 2 MG/ML IJ SOLN
1.0000 mg | Freq: Once | INTRAMUSCULAR | Status: AC
  Administered 2018-03-08: 1 mg via INTRAVENOUS
  Filled 2018-03-08: qty 1

## 2018-03-08 MED ORDER — HEPARIN SOD (PORK) LOCK FLUSH 10 UNIT/ML IV SOLN
INTRAVENOUS | Status: AC
  Filled 2018-03-08: qty 1

## 2018-03-08 MED ORDER — IPRATROPIUM-ALBUTEROL 0.5-2.5 (3) MG/3ML IN SOLN
RESPIRATORY_TRACT | Status: AC
  Administered 2018-03-08: 3 mL via RESPIRATORY_TRACT
  Filled 2018-03-08: qty 3

## 2018-03-08 NOTE — ED Provider Notes (Signed)
[facility] Emergency Department Provider Note ____________________________________________   First MD Initiated Contact with Patient 03/08/18 2033     (approximate)  I have reviewed the triage vital signs and the nursing notes.   HISTORY  Chief Complaint Shortness of Breath  Level 5 caveat: History of present illness limited due to respiratory distress  HPI Mark [name]. is a 72 y.o. male with PMH as noted below who presents with shortness of breath, acute onset this evening after dialysis, associated with increased work of breathing.  The patient denies associated cough or chest pain.  He states he has had prior episodes before when he has been anemic or after getting chemotherapy.  The patient states that he was being treated with chemotherapy for lymphoma but the last treatment was several months ago.   Past Medical History:  Diagnosis Date  . Anemia   . Arthritis   . Bladder cancer ([place])   . Chronic kidney disease   . Diabetes mellitus without complication ([place])   . Dyspnea    DOE  . ESRD on hemodialysis [facility])    Started dialysis in 2014, gets HD as of 2019 in [place] w/ Dr Rolly Salter on MWF schedule.    [name] GERD (gastroesophageal reflux disease)   . HOH (hard of hearing)   . Hypertension   . IBS (irritable bowel syndrome)   . Lymphoma ([place]) 09/27/2014  . Neuropathy    RIGHT LEG  . Stroke [facility])    TIA    Patient Active Problem List   Diagnosis Date Noted  . Malignant neoplasm of urinary bladder ([place]) 02/07/2018  . Respiratory failure with hypoxia ([place]) 01/17/2018  . Acute respiratory failure with hypoxia ([place]) 01/12/2018  . Acute respiratory failure ([place]) 01/12/2018  . Symptomatic anemia 12/31/2017  . Acute on chronic respiratory failure with hypoxemia ([place]) 12/31/2017  . Hypoxia 12/25/2017  . Pressure injury of skin 12/16/2017  . Fluid overload 01/15/2017  . UTI (urinary tract infection) 10/20/2016  . Sepsis ([place]) 10/20/2016    . 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 Ulcer of foot, chronic ([place]) 03/21/2015  . History of bladder cancer 11/10/2014  . Penile bleeding 10/12/2014  . Dependence on renal dialysis ([place]) 08/18/2013  . Arterial blood pressure decreased 08/18/2013  . Leukemia ([place]) 08/18/2013  . Retina disorder 08/18/2013  . Breath shortness 08/18/2013  . End-stage renal disease on hemodialysis ([place]) 12/23/2012  . Absolute anemia 07/18/2012  . HPTH (hyperparathyroidism) ([place]) 07/18/2012  . Acidosis, metabolic 30/16/0109  . Hyperparathyroidism ([place]) 07/18/2012  . Abnormal presence of protein in urine 01/01/2012  . HLD (hyperlipidemia) 12/25/2011  . Diabetic retinopathy associated with type 2 diabetes mellitus ([place]) 10/26/1999  . Essential (primary) hypertension 04/04/1999  . Diabetes mellitus type 2, uncontrolled ([place]) 04/04/1999  . Type 2 diabetes mellitus with other diabetic kidney complication ([place]) 32/35/5732    Past Surgical History:  Procedure Laterality Date  . BACK SURGERY  2007  . CATARACT EXTRACTION W/PHACO Left 03/23/2016   Procedure: CATARACT EXTRACTION PHACO AND INTRAOCULAR LENS PLACEMENT (IOC);  Surgeon: Leandrew Koyanagi, MD;  Location: [facility] ORS;  Service: Ophthalmology;  Laterality: Left;  [place] 52.6 AP% 14.7 CDE 7.72 Fluid pack lot # [id number]  . CATARACT EXTRACTION W/PHACO Right 05/30/2016   Procedure: CATARACT EXTRACTION PHACO AND INTRAOCULAR LENS PLACEMENT ([place]);  Surgeon: Leandrew Koyanagi, MD;  Location: [facility] ORS;  Service: Ophthalmology;  Laterality: Right;  [place] 01:08 AP% 13.9  CDE 9.53  note: could not get IV in patient, so procedure done without anesthesia personell present, ok per Dr Wallace Going fluid pack lo t# [id number]  . CIRCUMCISION    . IVC FILTER INSERTION N/A 12/31/2017   Procedure: IVC FILTER INSERTION;  Surgeon: Algernon Huxley, MD;  Location: [place] [facility];  Service:  Cardiovascular;  Laterality: N/A;  . PERIPHERAL VASCULAR CATHETERIZATION Left 08/19/2015   Procedure: A/V Shuntogram/Fistulagram;  Surgeon: Algernon Huxley, MD;  Location: [place] [facility];  Service: Cardiovascular;  Laterality: Left;  . PERIPHERAL VASCULAR CATHETERIZATION N/A 08/19/2015   Procedure: A/V Shunt Intervention;  Surgeon: Algernon Huxley, MD;  Location: [place] [facility];  Service: Cardiovascular;  Laterality: N/A;  . PORTA CATH INSERTION N/A 01/02/2018   Procedure: PORTA CATH INSERTION;  Surgeon: Katha Cabal, MD;  Location: [place] [facility];  Service: Cardiovascular;  Laterality: N/A;    Prior to Admission medications   Medication Sig Start Date End Date Taking? Authorizing Provider  albuterol (PROVENTIL) (2.5 MG/3ML) 0.083% nebulizer solution Take 2.5 mg by nebulization every 6 (six) hours as needed for wheezing or shortness of breath.   Yes [provider]  Difluprednate (DUREZOL) 0.05 % EMUL Place 1 drop into the left eye 2 (two) times daily.    Yes [provider]  docusate sodium (COLACE) 100 MG capsule Take 100 mg by mouth daily as needed for mild constipation.   Yes [provider]  fluticasone (FLONASE) 50 MCG/ACT nasal spray Place 1 spray into both nostrils daily as needed for allergies.  12/14/14  Yes [provider]  gabapentin (NEURONTIN) 100 MG capsule Take 1 capsule (100 mg total) by mouth 3 (three) times daily. Patient taking differently: Take 100 mg by mouth at bedtime.  03/02/16  Yes Edrick Kins, DPM  glimepiride (AMARYL) 2 MG tablet Take 2 mg by mouth at bedtime. Prn glucose levels 10/06/14  Yes [provider]  guaiFENesin (MUCINEX) 600 MG 12 hr tablet Take 1 tablet (600 mg total) by mouth 2 (two) times daily. Patient taking differently: Take 600 mg by mouth 2 (two) times daily as needed.  01/08/18  Yes Dustin Flock, MD  guaiFENesin-dextromethorphan (ROBITUSSIN DM) 100-10 MG/5ML syrup Take 15 mLs by mouth every  4 (four) hours as needed for cough. 01/08/18  Yes Dustin Flock, MD  insulin detemir (LEVEMIR) 100 UNIT/ML injection Inject 0.14 mLs (14 Units total) into the skin daily. Patient taking differently: Inject 10 Units into the skin daily.  01/09/18  Yes Dustin Flock, MD  lovastatin (MEVACOR) 20 MG tablet Take 1 tablet by mouth daily. 12/25/16  Yes [provider]  metoCLOPramide (REGLAN) 5 MG tablet Take 5 mg by mouth 3 (three) times daily before meals. Patient takes 1 tab with each meal.   Yes [provider]  midodrine (PROAMATINE) 10 MG tablet Take 1 tablet (10 mg total) by mouth every dialysis (MWF). 01/08/18  Yes Dustin Flock, MD  multivitamin (RENA-VIT) TABS tablet Take 1 tablet by mouth at bedtime.    Yes [provider]  nystatin cream (MYCOSTATIN) Apply 1 application topically 2 (two) times daily as needed (irritation).    Yes [provider]  polyethylene glycol (MIRALAX / GLYCOLAX) packet Take 17 g by mouth daily. Patient taking differently: Take 17 g by mouth daily as needed.  01/16/18  Yes Lavina Hamman, MD  Polyvinyl Alcohol-Povidone PF (REFRESH) 1.4-0.6 % SOLN Apply 1 drop to eye 3 (three) times daily as needed (  Dry eyes).   Yes [provider]  silver sulfADIAZINE (SILVADENE) 1 % cream APPLY 1 APPLICATION TOPICALY DAILY Patient taking differently: Apply 1 application topically daily as needed (sores).  04/12/17  Yes Edrick Kins, DPM  sodium chloride (OCEAN) 0.65 % SOLN nasal spray Place 1 spray into both nostrils as needed for congestion. 01/08/18  Yes Dustin Flock, MD  budesonide-formoterol [facility]) 160-4.5 MCG/ACT inhaler Inhale 2 puffs into the lungs 2 (two) times daily. Patient not taking: Reported on 03/08/2018 12/27/17   Gladstone Lighter, MD  ENSURE PLUS (ENSURE PLUS) LIQD Take 1 Can by mouth daily.  01/26/14   [provider]  insulin aspart (NOVOLOG) 100 UNIT/ML injection Inject 4 Units into the skin 3 (three)  times daily with meals. Patient not taking: Reported on 03/08/2018 01/08/18   Dustin Flock, MD  ipratropium-albuterol (DUONEB) 0.5-2.5 (3) MG/3ML SOLN Take 3 mLs by nebulization 4 (four) times daily. Patient not taking: Reported on 03/08/2018 01/08/18   Dustin Flock, MD  predniSONE (STERAPRED UNI-PAK 21 TAB) 10 MG (21) TBPK tablet Start at 60mg  taper by 10mg  until complete Patient not taking: Reported on 03/08/2018 01/08/18   Dustin Flock, MD  torsemide (DEMADEX) 100 MG tablet Take 1 tablet (100 mg total) by mouth daily. Please take on Non dialysis days- Tues, Thurs, Saturday and sunday Patient not taking: Reported on 03/08/2018 12/28/17   Gladstone Lighter, MD    Allergies Acyclovir and related; Heparin; Ibuprofen; Multivitamin [centrum]; Daypro [oxaprozin]; and Tape  Family History  Problem Relation Age of Onset  . Cancer Mother   . Kidney cancer Neg Hx   . Prostate cancer Neg Hx   . Kidney failure Neg Hx   . Bladder Cancer Neg Hx     Social History Social History   Tobacco Use  . Smoking status: Former Smoker    Types: Cigarettes    Last attempt to quit: 05/19/2013    Years since quitting: 4.8  . Smokeless tobacco: Never Used  . Tobacco comment: quit  Substance Use Topics  . Alcohol use: No    Alcohol/week: 0.0 standard drinks  . Drug use: No    Review of Systems 05 caveat: Review of systems limited due to respiratory distress Constitutional: No fever. Cardiovascular: Denies chest pain. Respiratory: Positive for shortness of breath. Gastrointestinal: No vomiting. Neurological: Negative for headache.   ____________________________________________   PHYSICAL EXAM:  VITAL SIGNS: ED Triage Vitals  Enc Vitals Group     BP 03/08/18 2012 (!) 161/82     Pulse Rate 03/08/18 2012 85     Resp 03/08/18 2012 (!) 44     Temp 03/08/18 2012 98.1 F (36.7 C)     Temp Source 03/08/18 2012 Oral     SpO2 03/08/18 2012 100 %     Weight 03/08/18 2014 180 lb (81.6 kg)      Height 03/08/18 2014 5\' 11"  (1.803 m)     Head Circumference --      Peak Flow --      Pain Score 03/08/18 2013 7     Pain Loc --      Pain Edu? --      Excl. in [place]? --     Constitutional: Alert and oriented.  Uncomfortable appearing.  Eyes: Conjunctivae are normal.  Head: Atraumatic. Nose: No congestion/rhinnorhea. Mouth/Throat: Mucous membranes are somewhat dry.   Neck: Normal range of motion.  Cardiovascular: Normal rate, regular rhythm. Grossly normal heart sounds.  Good peripheral circulation. Respiratory: Increased respiratory effort.  Coarse breath sounds bilaterally and somewhat decreased in the right base.   Gastrointestinal: Soft and nontender. No distention.  Genitourinary: No flank tenderness. Musculoskeletal: No lower extremity edema.  Extremities warm and well perfused.  Neurologic:  Normal speech and language. No gross focal neurologic deficits are appreciated.  Skin:  Skin is warm and dry. No rash noted. Psychiatric: Speech and behavior are normal.  ____________________________________________   LABS (all labs ordered are listed, but only abnormal results are displayed)  Labs Reviewed  BASIC METABOLIC PANEL - Abnormal; Notable for the following components:      Result Value   Potassium 3.0 (*)    Glucose, Bld 331 (*)    Creatinine, Ser 3.53 (*)    Calcium 8.3 (*)    GFR calc non Af Amer 16 (*)    GFR calc Af Amer 19 (*)    All other components within normal limits  CBC - Abnormal; Notable for the following components:   RBC 3.13 (*)    Hemoglobin 9.0 (*)    HCT 29.0 (*)    RDW 19.0 (*)    Platelets 94 (*)    All other components within normal limits  TROPONIN I - Abnormal; Notable for the following components:   Troponin I 0.03 (*)    All other components within normal limits  BRAIN NATRIURETIC PEPTIDE - Abnormal; Notable for the following components:   B Natriuretic Peptide 316.0 (*)    All other components within normal limits  BLOOD GAS, VENOUS -  Abnormal; Notable for the following components:   pH, Ven 7.59 (*)    pCO2, Ven 29 (*)    Acid-Base Excess 6.6 (*)    All other components within normal limits  GLUCOSE, CAPILLARY - Abnormal; Notable for the following components:   Glucose-Capillary 306 (*)    All other components within normal limits   ____________________________________________  EKG  ED ECG REPORT I, Arta Silence, the attending physician, personally viewed and interpreted this ECG.  Date: 03/08/2018 EKG Time: 2010 Rate: 90 Rhythm: normal sinus rhythm QRS Axis: normal Intervals: normal ST/T Wave abnormalities: Nonspecific repolarization abnormality Narrative Interpretation: no evidence of acute ischemia; no significant change when compared to EKG of 01/17/2018  ____________________________________________  RADIOLOGY  CXR: No focal infiltrate.  Small right pleural effusion, chronic.  ____________________________________________   PROCEDURES  Procedure(s) performed: No  Procedures  Critical Care performed: Yes  CRITICAL CARE Performed by: Arta Silence   Total critical care time: 15 minutes  Critical care time was exclusive of separately billable procedures and treating other patients.  Critical care was necessary to treat or prevent imminent or life-threatening deterioration.  Critical care was time spent personally by me on the following activities: development of treatment plan with patient and/or surrogate as well as nursing, discussions with consultants, evaluation of patient's response to treatment, examination of patient, obtaining history from patient or surrogate, ordering and performing treatments and interventions, ordering and review of laboratory studies, ordering and review of radiographic studies, pulse oximetry and re-evaluation of patient's condition. ____________________________________________   INITIAL IMPRESSION / ASSESSMENT AND PLAN / ED COURSE  Pertinent labs  & imaging results that were available during my care of the patient were reviewed by me and considered in my medical decision making (see chart for details).  72 year old male with PMH as noted above presents with acute onset of shortness of breath today after dialysis.  The patient states that he has had similar episodes after getting chemotherapy, and his family member  mentions that he has had similar shortness of breath when he has been very anemic.  His last chemo was several months ago.  I reviewed the past medical records in epic; the most recent admission was for acute pulmonary edema with respiratory failure and hypoxia and October of this year.  He was also admitted for respiratory failure earlier that month.  On exam the patient is uncomfortable appearing, tachypneic, and with increased work of breathing.  He has coarse breath sounds bilaterally and somewhat decreased breath sounds in the right base.  The remainder of the exam is as described above.  He is alert and able to answer questions.  Differential includes pulmonary edema, pneumonia or other infectious cause, or anemia.  The patient was placed on BiPAP due to his work of breathing and hypoxia.  We will obtain chest x-ray, lab work-up, and reassess.  ----------------------------------------- 11:44 PM on 03/08/2018 -----------------------------------------  The patient's lab work-up is all reassuring.  His chemistry shows slightly low potassium although given that he has ESRD, there is no indication to give him IV potassium at this time.  His glucose is elevated but there is no anion gap or evidence of DKA.  The VBG shows elevated pH and low CO2 consistent with respiratory alkalosis likely from hyperventilation.  The patient's hemoglobin is actually improved from prior.  His troponin is minimally elevated but given his ESRD and is chronically elevated, and lower than prior today.  Chest x-ray shows chronic small right pleural  effusion.  On reassessment the patient continued to be somewhat tachypneic to the 20s but his O2 saturation was normal.  I switched the patient over to his normal 4 L by nasal cannula and his O2 saturation remained 100%.  Throughout the ED evaluation, the patient seems somewhat anxious and labile.  He frequently was speaking quickly and raising his voice.  Based on further discussion with him and with his family it appears that he has chronic anxiety that exacerbates his respiratory symptoms and is sometimes the source of them.  Given that the work-up and x-ray were otherwise negative, I suggested that this may be a primary factor tonight and offered to give him a dose of Ativan.  He and the family members agreed with this.  After the Ativan, the patient's symptoms completely resolved.  His respiratory rate went down below 20, he appeared much more calm and comfortable, and he continued to have an O2 saturation of 100% on nasal cannula.  I had an extensive discussion with the patient and his family members about the results of his work-up and the plan of care.  I initially assumed that we would need to admit him given that he required BiPAP although now that his symptoms have resolved, his hemoglobin and other lab results are actually better than prior, and he has no pulmonary edema and does not appear to have any other active respiratory issue, I do not think he will benefit from admission.  The patient agrees and states he feels comfortable going home.  The family members also agree with this plan.  I counseled them extensively on return precautions and they expressed understanding.  ____________________________________________   FINAL CLINICAL IMPRESSION(S) / ED DIAGNOSES  Final diagnoses:  Shortness of breath      NEW MEDICATIONS STARTED DURING THIS VISIT:  New Prescriptions   No medications on file     Note:  This document was prepared using Dragon voice recognition software  and may include unintentional dictation errors.  Arta Silence, MD 03/08/18 2350

## 2018-03-08 NOTE — Discharge Instructions (Addendum)
Continue to take your normal medications and go to your dialysis on the normal schedule.  Contact your doctor to arrange for follow-up within the next 1 to 2 weeks.  Return to the ER for new, worsening, persistent severe shortness of breath, anxiety, chest pain, weakness or lightheadedness, or any other new or worsening symptoms that concern you.

## 2018-03-08 NOTE — ED Notes (Addendum)
Respiratory at bedside. Pt placed on Bi-PAP.

## 2018-03-08 NOTE — ED Notes (Signed)
Pt yelling at staff stating that a mask needs to be put on him to help him breathe and that he is a hard stick. Pt then swings arm in front of RN Margreta Journey) to show that he has a bracelet that states (powerport) and states "can't you read." RN Margreta Journey) at bedside and hit call bell for help and for doctor to evaluate patient.

## 2018-03-08 NOTE — ED Notes (Signed)
Date and time results received: 03/08/18 2108 (use smartphrase ".now" to insert current time)  Test: Troponin Critical Value: 0.03  Name of Provider Notified: Siadecki   Orders Received? Or Actions Taken?: No orders received

## 2018-03-08 NOTE — ED Notes (Signed)
Family requesting to talk to charge nurse after concerns with care up to this point. Pt verbalized frustration that the nurses attempted an IV when he had a port and requested no IV. Pt also frustrated that he requested Bipap but had to wait for MD to order. Pt also apologized for yelling at staff upon arrival but verbalized frustration that staff threatened to get security if he did not calm down. Charge nurse apologized for concerns to this point, asked what could be done to help with their care and reassure them he was being cared for appropriately. Pt resting in bed with call bell in reach and a plan of care. MD back at bedside.

## 2018-03-08 NOTE — ED Triage Notes (Signed)
Pt arrived via EMS c/o difficulty breathing that began at approx 1830

## 2018-03-22 ENCOUNTER — Other Ambulatory Visit: Payer: Self-pay

## 2018-03-22 ENCOUNTER — Encounter: Payer: Self-pay | Admitting: Emergency Medicine

## 2018-03-22 ENCOUNTER — Emergency Department: Payer: Medicare HMO

## 2018-03-22 ENCOUNTER — Emergency Department
Admission: EM | Admit: 2018-03-22 | Discharge: 2018-03-22 | Disposition: A | Discharge status: Home / Self Care | Payer: Medicare HMO | Attending: Emergency Medicine | Admitting: Emergency Medicine

## 2018-03-22 DIAGNOSIS — Z87891 Personal history of nicotine dependence: Secondary | ICD-10-CM | POA: Diagnosis not present

## 2018-03-22 DIAGNOSIS — E119 Type 2 diabetes mellitus without complications: Secondary | ICD-10-CM | POA: Insufficient documentation

## 2018-03-22 DIAGNOSIS — R0602 Shortness of breath: Secondary | ICD-10-CM

## 2018-03-22 DIAGNOSIS — Z8551 Personal history of malignant neoplasm of bladder: Secondary | ICD-10-CM | POA: Insufficient documentation

## 2018-03-22 DIAGNOSIS — I120 Hypertensive chronic kidney disease with stage 5 chronic kidney disease or end stage renal disease: Secondary | ICD-10-CM | POA: Diagnosis not present

## 2018-03-22 DIAGNOSIS — Z79899 Other long term (current) drug therapy: Secondary | ICD-10-CM | POA: Diagnosis not present

## 2018-03-22 DIAGNOSIS — Z992 Dependence on renal dialysis: Secondary | ICD-10-CM | POA: Insufficient documentation

## 2018-03-22 DIAGNOSIS — N186 End stage renal disease: Secondary | ICD-10-CM | POA: Diagnosis not present

## 2018-03-22 DIAGNOSIS — J449 Chronic obstructive pulmonary disease, unspecified: Secondary | ICD-10-CM | POA: Insufficient documentation

## 2018-03-22 DIAGNOSIS — Z794 Long term (current) use of insulin: Secondary | ICD-10-CM | POA: Diagnosis not present

## 2018-03-22 LAB — CBC WITH DIFFERENTIAL/PLATELET
Band Neutrophils: 0 %
Basophils Absolute: 0 10*3/uL (ref 0.0–0.1)
Basophils Relative: 0 %
Blasts: 0 %
Eosinophils Absolute: 0.1 10*3/uL (ref 0.0–0.5)
Eosinophils Relative: 3 %
HCT: 32.2 % — ABNORMAL LOW (ref 39.0–52.0)
Hemoglobin: 10.1 g/dL — ABNORMAL LOW (ref 13.0–17.0)
Lymphocytes Relative: 50 %
Lymphs Abs: 2.4 10*3/uL (ref 0.7–4.0)
MCH: 29.1 pg (ref 26.0–34.0)
MCHC: 31.4 g/dL (ref 30.0–36.0)
MCV: 92.8 fL (ref 80.0–100.0)
MONOS PCT: 1 %
Metamyelocytes Relative: 0 %
Monocytes Absolute: 0 10*3/uL — ABNORMAL LOW (ref 0.1–1.0)
Myelocytes: 0 %
Neutro Abs: 2.1 10*3/uL (ref 1.7–7.7)
Neutrophils Relative %: 46 %
Other: 0 %
Platelets: 99 10*3/uL — ABNORMAL LOW (ref 150–400)
Promyelocytes Relative: 0 %
RBC: 3.47 MIL/uL — ABNORMAL LOW (ref 4.22–5.81)
RDW: 19.7 % — AB (ref 11.5–15.5)
WBC: 4.6 10*3/uL (ref 4.0–10.5)
nRBC: 0 % (ref 0.0–0.2)
nRBC: 0 /100 WBC

## 2018-03-22 LAB — BASIC METABOLIC PANEL
Anion gap: 15 (ref 5–15)
BUN: 21 mg/dL (ref 8–23)
CO2: 23 mmol/L (ref 22–32)
Calcium: 8.5 mg/dL — ABNORMAL LOW (ref 8.9–10.3)
Chloride: 98 mmol/L (ref 98–111)
Creatinine, Ser: 4.04 mg/dL — ABNORMAL HIGH (ref 0.61–1.24)
GFR calc Af Amer: 16 mL/min — ABNORMAL LOW (ref >60)
GFR calc non Af Amer: 14 mL/min — ABNORMAL LOW (ref >60)
GLUCOSE: 234 mg/dL — AB (ref 70–99)
Potassium: 2.9 mmol/L — ABNORMAL LOW (ref 3.5–5.1)
Sodium: 136 mmol/L (ref 135–145)

## 2018-03-22 LAB — TROPONIN I: Troponin I: <0.03 ng/mL (ref <0.03)

## 2018-03-22 MED ORDER — IPRATROPIUM-ALBUTEROL 0.5-2.5 (3) MG/3ML IN SOLN
9.0000 mL | Freq: Once | RESPIRATORY_TRACT | Status: AC
  Administered 2018-03-22: 9 mL via RESPIRATORY_TRACT
  Filled 2018-03-22: qty 9

## 2018-03-22 MED ORDER — HEPARIN SOD (PORK) LOCK FLUSH 100 UNIT/ML IV SOLN
INTRAVENOUS | Status: AC
  Administered 2018-03-22: 500 [IU]
  Filled 2018-03-22: qty 5

## 2018-03-22 MED ORDER — METHYLPREDNISOLONE SODIUM SUCC 125 MG IJ SOLR
125.0000 mg | Freq: Once | INTRAMUSCULAR | Status: AC
  Administered 2018-03-22: 125 mg via INTRAVENOUS
  Filled 2018-03-22: qty 2

## 2018-03-22 NOTE — ED Notes (Signed)
Pt with left arm restriction due to dialysis shunt.  Pt refuses any PIV to right arm due to personal preference, states, "I have a port, I am not to have any IV's except for a blood gas, I am a hard stick."

## 2018-03-22 NOTE — ED Triage Notes (Signed)
Pt in via [facility] from home with complaints of sudden onset shortness of breath this evening, reports taking home nebulizers without any relief.  Pt is no dialysis, last full treatment today.  Pt hyperventilating upon arrival.  Vitals WDL.

## 2018-03-22 NOTE — ED Provider Notes (Addendum)
[facility] Emergency Department Provider Note  ____________________________________________   First MD Initiated Contact with Patient 03/22/18 1856     (approximate)  I have reviewed the triage vital signs and the nursing notes.   HISTORY  Chief Complaint Shortness of Breath   HPI Mark [name]. is a 72 y.o. male with a history of COPD as well as end-stage renal disease on dialysis was presented department sudden onset shortness of breath this evening.  He states that he has had this happen to her multiple times in the past.  Was recently here in the emergency department with similar symptoms.  Says that he has had bronchitis as well pneumonia with these symptoms.  Was just dialyzed today.  Denies any pain at this time.  Says that he has not smoked in 5 years.   Past Medical History:  Diagnosis Date  . Anemia   . Arthritis   . Bladder cancer ([place])   . Chronic kidney disease   . Diabetes mellitus without complication ([place])   . Dyspnea    DOE  . ESRD on hemodialysis [facility])    Started dialysis in 2014, gets HD as of 2019 in [place] w/ Dr Rolly Salter on MWF schedule.    [name] GERD (gastroesophageal reflux disease)   . HOH (hard of hearing)   . Hypertension   . IBS (irritable bowel syndrome)   . Lymphoma ([place]) 09/27/2014  . Neuropathy    RIGHT LEG  . Stroke [facility])    TIA    Patient Active Problem List   Diagnosis Date Noted  . Malignant neoplasm of urinary bladder ([place]) 02/07/2018  . Respiratory failure with hypoxia ([place]) 01/17/2018  . Acute respiratory failure with hypoxia ([place]) 01/12/2018  . Acute respiratory failure ([place]) 01/12/2018  . Symptomatic anemia 12/31/2017  . Acute on chronic respiratory failure with hypoxemia ([place]) 12/31/2017  . Hypoxia 12/25/2017  . Pressure injury of skin 12/16/2017  . Fluid overload 01/15/2017  . UTI (urinary tract infection) 10/20/2016  . Sepsis ([place]) 10/20/2016  . 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 Ulcer of foot, chronic ([place]) 03/21/2015  . History of bladder cancer 11/10/2014  . Penile bleeding 10/12/2014  . Dependence on renal dialysis ([place]) 08/18/2013  . Arterial blood pressure decreased 08/18/2013  . Leukemia ([place]) 08/18/2013  . Retina disorder 08/18/2013  . Breath shortness 08/18/2013  . End-stage renal disease on hemodialysis ([place]) 12/23/2012  . Absolute anemia 07/18/2012  . HPTH (hyperparathyroidism) ([place]) 07/18/2012  . Acidosis, metabolic 63/04/6008  . Hyperparathyroidism ([place]) 07/18/2012  . Abnormal presence of protein in urine 01/01/2012  . HLD (hyperlipidemia) 12/25/2011  . Diabetic retinopathy associated with type 2 diabetes mellitus ([place]) 10/26/1999  . Essential (primary) hypertension 04/04/1999  . Diabetes mellitus type 2, uncontrolled ([place]) 04/04/1999  . Type 2 diabetes mellitus with other diabetic kidney complication ([place]) 93/23/5573    Past Surgical History:  Procedure Laterality Date  . BACK SURGERY  2007  . CATARACT EXTRACTION W/PHACO Left 03/23/2016   Procedure: CATARACT EXTRACTION PHACO AND INTRAOCULAR LENS PLACEMENT (IOC);  Surgeon: Leandrew Koyanagi, MD;  Location: [facility] ORS;  Service: Ophthalmology;  Laterality: Left;  [place] 52.6 AP% 14.7 CDE 7.72 Fluid pack lot # [id number]  . CATARACT EXTRACTION W/PHACO Right 05/30/2016   Procedure: CATARACT EXTRACTION PHACO AND INTRAOCULAR LENS PLACEMENT ([place]);  Surgeon: Leandrew Koyanagi, MD;  Location: [facility] ORS;  Service: Ophthalmology;  Laterality: Right;  [place] 01:08  AP% 13.9 CDE 9.53  note: could not get IV in patient, so procedure done without anesthesia personell present, ok per Dr Wallace Going fluid pack lo t# [id number]  . CIRCUMCISION    . IVC FILTER INSERTION N/A 12/31/2017   Procedure: IVC FILTER INSERTION;  Surgeon: Algernon Huxley, MD;  Location: [place] CV LAB;  Service: Cardiovascular;  Laterality: N/A;  . PERIPHERAL VASCULAR  CATHETERIZATION Left 08/19/2015   Procedure: A/V Shuntogram/Fistulagram;  Surgeon: Algernon Huxley, MD;  Location: [place] CV LAB;  Service: Cardiovascular;  Laterality: Left;  . PERIPHERAL VASCULAR CATHETERIZATION N/A 08/19/2015   Procedure: A/V Shunt Intervention;  Surgeon: Algernon Huxley, MD;  Location: [place] CV LAB;  Service: Cardiovascular;  Laterality: N/A;  . PORTA CATH INSERTION N/A 01/02/2018   Procedure: PORTA CATH INSERTION;  Surgeon: Katha Cabal, MD;  Location: [place] [facility];  Service: Cardiovascular;  Laterality: N/A;    Prior to Admission medications   Medication Sig Start Date End Date Taking? Authorizing Provider  albuterol (PROVENTIL) (2.5 MG/3ML) 0.083% nebulizer solution Take 2.5 mg by nebulization every 6 (six) hours as needed for wheezing or shortness of breath.    [provider]  budesonide-formoterol (SYMBICORT) 160-4.5 MCG/ACT inhaler Inhale 2 puffs into the lungs 2 (two) times daily. Patient not taking: Reported on 03/08/2018 12/27/17   Gladstone Lighter, MD  Difluprednate (DUREZOL) 0.05 % EMUL Place 1 drop into the left eye 2 (two) times daily.     [provider]  docusate sodium (COLACE) 100 MG capsule Take 100 mg by mouth daily as needed for mild constipation.    [provider]  ENSURE PLUS (ENSURE PLUS) LIQD Take 1 Can by mouth daily.  01/26/14   [provider]  fluticasone (FLONASE) 50 MCG/ACT nasal spray Place 1 spray into both nostrils daily as needed for allergies.  12/14/14   [provider]  gabapentin (NEURONTIN) 100 MG capsule Take 1 capsule (100 mg total) by mouth 3 (three) times daily. Patient taking differently: Take 100 mg by mouth at bedtime.  03/02/16   Edrick Kins, DPM  glimepiride (AMARYL) 2 MG tablet Take 2 mg by mouth at bedtime. Prn glucose levels 10/06/14   [provider]  guaiFENesin (MUCINEX) 600 MG 12 hr tablet Take 1 tablet (600 mg total) by mouth 2 (two) times  daily. Patient taking differently: Take 600 mg by mouth 2 (two) times daily as needed.  01/08/18   Dustin Flock, MD  guaiFENesin-dextromethorphan (ROBITUSSIN DM) 100-10 MG/5ML syrup Take 15 mLs by mouth every 4 (four) hours as needed for cough. 01/08/18   Dustin Flock, MD  insulin aspart (NOVOLOG) 100 UNIT/ML injection Inject 4 Units into the skin 3 (three) times daily with meals. Patient not taking: Reported on 03/08/2018 01/08/18   Dustin Flock, MD  insulin detemir (LEVEMIR) 100 UNIT/ML injection Inject 0.14 mLs (14 Units total) into the skin daily. Patient taking differently: Inject 10 Units into the skin daily.  01/09/18   Dustin Flock, MD  ipratropium-albuterol (DUONEB) 0.5-2.5 (3) MG/3ML SOLN Take 3 mLs by nebulization 4 (four) times daily. Patient not taking: Reported on 03/08/2018 01/08/18   Dustin Flock, MD  lovastatin (MEVACOR) 20 MG tablet Take 1 tablet by mouth daily. 12/25/16   [provider]  metoCLOPramide (REGLAN) 5 MG tablet Take 5 mg by mouth 3 (three) times daily before meals. Patient takes 1 tab with each meal.    [provider]  midodrine (PROAMATINE) 10 MG tablet Take  1 tablet (10 mg total) by mouth every dialysis (MWF). 01/08/18   Dustin Flock, MD  multivitamin (RENA-VIT) TABS tablet Take 1 tablet by mouth at bedtime.     [provider]  nystatin cream (MYCOSTATIN) Apply 1 application topically 2 (two) times daily as needed (irritation).     [provider]  polyethylene glycol (MIRALAX / GLYCOLAX) packet Take 17 g by mouth daily. Patient taking differently: Take 17 g by mouth daily as needed.  01/16/18   Lavina Hamman, MD  Polyvinyl Alcohol-Povidone PF (REFRESH) 1.4-0.6 % SOLN Apply 1 drop to eye 3 (three) times daily as needed (Dry eyes).    [provider]  predniSONE (STERAPRED UNI-PAK 21 TAB) 10 MG (21) TBPK tablet Start at 60mg  taper by 10mg  until complete Patient not taking: Reported on 03/08/2018 01/08/18    Dustin Flock, MD  silver sulfADIAZINE (SILVADENE) 1 % cream APPLY 1 APPLICATION TOPICALY DAILY Patient taking differently: Apply 1 application topically daily as needed (sores).  04/12/17   Edrick Kins, DPM  sodium chloride (OCEAN) 0.65 % SOLN nasal spray Place 1 spray into both nostrils as needed for congestion. 01/08/18   Dustin Flock, MD  torsemide (DEMADEX) 100 MG tablet Take 1 tablet (100 mg total) by mouth daily. Please take on Non dialysis days- Tues, Thurs, Saturday and sunday Patient not taking: Reported on 03/08/2018 12/28/17   Gladstone Lighter, MD    Allergies Acyclovir and related; Heparin; Ibuprofen; Multivitamin [centrum]; Daypro [oxaprozin]; and Tape  Family History  Problem Relation Age of Onset  . Cancer Mother   . Kidney cancer Neg Hx   . Prostate cancer Neg Hx   . Kidney failure Neg Hx   . Bladder Cancer Neg Hx     Social History Social History   Tobacco Use  . Smoking status: Former Smoker    Types: Cigarettes    Last attempt to quit: 05/19/2013    Years since quitting: 4.8  . Smokeless tobacco: Never Used  . Tobacco comment: quit  Substance Use Topics  . Alcohol use: No    Alcohol/week: 0.0 standard drinks  . Drug use: No    Review of Systems  Constitutional: No fever/chills Eyes: No visual changes. ENT: No sore throat. Cardiovascular: Denies chest pain. Respiratory: As above.  No cough. Gastrointestinal: No abdominal pain.  No nausea, no vomiting.  No diarrhea.  No constipation. Genitourinary: Negative for dysuria. Musculoskeletal: Negative for back pain. Skin: Negative for rash. Neurological: Negative for headaches, focal weakness or numbness.   ____________________________________________   PHYSICAL EXAM:  VITAL SIGNS: ED Triage Vitals  Enc Vitals Group     BP 03/22/18 1851 140/85     Pulse Rate 03/22/18 1851 83     Resp 03/22/18 1851 (!) 32     Temp 03/22/18 1851 98 F (36.7 C)     Temp Source 03/22/18 1851 Oral     SpO2  03/22/18 1850 100 %     Weight 03/22/18 1852 180 lb (81.6 kg)     Height 03/22/18 1852 5\' 11"  (1.803 m)     Head Circumference --      Peak Flow --      Pain Score 03/22/18 1852 0     Pain Loc --      Pain Edu? --      Excl. in [place]? --     Constitutional: Alert and oriented.  Tachypneic. Eyes: Conjunctivae are normal.  Head: Atraumatic. Nose: No congestion/rhinnorhea. Mouth/Throat: Mucous membranes are  moist.  Neck: No stridor.   Cardiovascular: Normal rate, regular rhythm. Grossly normal heart sounds.  Palpable thrill to the left forearm fistula. Respiratory: Slightly labored respirations with tachypnea and coarse rhonchi throughout with a prolonged expiratory phase. Gastrointestinal: Soft and nontender. No distention. Musculoskeletal: No lower extremity tenderness nor edema.  No joint effusions. Neurologic:  Normal speech and language. No gross focal neurologic deficits are appreciated. Skin:  Skin is warm, dry and intact. No rash noted. Psychiatric: Mood and affect are normal. Speech and behavior are normal.  ____________________________________________   LABS (all labs ordered are listed, but only abnormal results are displayed)  Labs Reviewed  CBC WITH DIFFERENTIAL/PLATELET - Abnormal; Notable for the following components:      Result Value   RBC 3.47 (*)    Hemoglobin 10.1 (*)    HCT 32.2 (*)    RDW 19.7 (*)    Platelets 99 (*)    Monocytes Absolute 0.0 (*)    All other components within normal limits  BASIC METABOLIC PANEL - Abnormal; Notable for the following components:   Potassium 2.9 (*)    Glucose, Bld 234 (*)    Creatinine, Ser 4.04 (*)    Calcium 8.5 (*)    GFR calc non Af Amer 14 (*)    GFR calc Af Amer 16 (*)    All other components within normal limits  TROPONIN I   ____________________________________________  EKG  ED ECG REPORT I, Doran Stabler, the attending physician, personally viewed and interpreted this ECG.   Date: 03/22/2018  EKG  Time: 1851  Rate: 82  Rhythm: normal sinus rhythm  Axis: Normal  Intervals:none  ST&T Change: No ST segment elevation or depression.  No abnormal T wave inversion.  ____________________________________________  RADIOLOGY  Mild interstitial edema on the chest x-ray. ____________________________________________   PROCEDURES  Procedure(s) performed:   Procedures  Critical Care performed:   ____________________________________________   INITIAL IMPRESSION / ASSESSMENT AND PLAN / ED COURSE  Pertinent labs & imaging results that were available during my care of the patient were reviewed by me and considered in my medical decision making (see chart for details).  Differential includes, but is not limited to, viral syndrome, bronchitis including COPD exacerbation, pneumonia, reactive airway disease including asthma, CHF including exacerbation with or without pulmonary/interstitial edema, pneumothorax, ACS, thoracic trauma, and pulmonary embolism. As part of my medical decision making, I reviewed the following data within the electronic MEDICAL RECORD NUMBER Notes from prior ED visits  ----------------------------------------- 10:09 PM on 03/22/2018 -----------------------------------------  Patient at this time without any shortness of breath.  Re-auscultated his lungs and he is clear throughout all fields.  Family now the bedside and states that he has had multiple episodes similar to this and Ativan has helped.  I discussed with the patient and family that we have a very strict emergency department policy about prescribing Ativan for ongoing issues and the patient and family agreed to follow-up with outpatient physicians including the patient's primary care doctor and pulmonologist at [facility].  Patient will be discharged at this time.  Family and patient say in the past that he has gone home without any antibiotics or steroids and had no issue.  Because the patient has multiple episodes of  this I do not want to overmedicate him especially if the etiology is more anxiety. ____________________________________________   FINAL CLINICAL IMPRESSION(S) / ED DIAGNOSES  Shortness of breath.  NEW MEDICATIONS STARTED DURING THIS VISIT:  New Prescriptions   No medications  on file     Note:  This document was prepared using Dragon voice recognition software and may include unintentional dictation errors.     Orbie Pyo, MD 03/22/18 2209    Orbie Pyo, MD 03/22/18 2211

## 2018-03-22 NOTE — ED Notes (Signed)
Pt given some crackers and apple sauce. Ok'd by provider.

## 2018-03-22 NOTE — ED Notes (Signed)
Dr. Lucita Lora made aware of trop of 0.03.

## 2018-04-23 ENCOUNTER — Ambulatory Visit (INDEPENDENT_AMBULATORY_CARE_PROVIDER_SITE_OTHER): Payer: Medicare HMO

## 2018-04-23 ENCOUNTER — Encounter (INDEPENDENT_AMBULATORY_CARE_PROVIDER_SITE_OTHER): Payer: Self-pay | Admitting: Vascular Surgery

## 2018-04-23 ENCOUNTER — Ambulatory Visit (INDEPENDENT_AMBULATORY_CARE_PROVIDER_SITE_OTHER): Payer: Medicare HMO | Admitting: Vascular Surgery

## 2018-04-23 VITALS — BP 143/67 | HR 69 | Resp 16 | Ht 71.0 in

## 2018-04-23 DIAGNOSIS — Z992 Dependence on renal dialysis: Secondary | ICD-10-CM

## 2018-04-23 DIAGNOSIS — N186 End stage renal disease: Secondary | ICD-10-CM | POA: Diagnosis not present

## 2018-04-23 DIAGNOSIS — E1129 Type 2 diabetes mellitus with other diabetic kidney complication: Secondary | ICD-10-CM

## 2018-04-23 DIAGNOSIS — I120 Hypertensive chronic kidney disease with stage 5 chronic kidney disease or end stage renal disease: Secondary | ICD-10-CM | POA: Diagnosis not present

## 2018-04-23 DIAGNOSIS — E785 Hyperlipidemia, unspecified: Secondary | ICD-10-CM | POA: Diagnosis not present

## 2018-04-23 DIAGNOSIS — E1122 Type 2 diabetes mellitus with diabetic chronic kidney disease: Secondary | ICD-10-CM

## 2018-04-23 DIAGNOSIS — I10 Essential (primary) hypertension: Secondary | ICD-10-CM

## 2018-04-23 DIAGNOSIS — Z95828 Presence of other vascular implants and grafts: Secondary | ICD-10-CM

## 2018-04-23 NOTE — Assessment & Plan Note (Signed)
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 As long as they continue to rotate the access sites, I would expect this fistula to continue to work well.  This can be rechecked in 6 to 9 months with duplex.

## 2018-04-23 NOTE — Assessment & Plan Note (Signed)
Given his debilitated status and inability to tolerate anticoagulation, his filter will be left in place indefinitely.

## 2018-04-23 NOTE — Progress Notes (Signed)
MRN : [id number]  Mark [name] [name]. is a 73 y.o. ([birth year]) male who presents with chief complaint of  Chief Complaint  Patient presents with  . Follow-up  .  History of Present Illness: Patient returns today in follow up.  He had a filter placed several months ago.  He cannot resume anticoagulation due to GI bleeding and other issues and this is still in place.  His legs are currently doing well with only mild pain and no swelling.  There is no plan to remove his filter.  He is also here to study his dialysis access.  He has an aneurysmal left radiocephalic AV fistula which has been in place for many years.  This is still working quite well for his dialysis without any new issues or problems.  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  Current Outpatient Medications  Medication Sig Dispense Refill  . albuterol (PROVENTIL) (2.5 MG/3ML) 0.083% nebulizer solution Take 2.5 mg by nebulization every 6 (six) hours as needed for wheezing or shortness of breath.    . Difluprednate (DUREZOL) 0.05 % EMUL Place 1 drop into the left eye 2 (two) times daily.     [name] docusate sodium (COLACE) 100 MG capsule Take 100 mg by mouth daily as needed for mild constipation.    . ENSURE PLUS (ENSURE PLUS) LIQD Take 237 mLs by mouth.    . fluticasone (FLONASE) 50 MCG/ACT nasal spray Place 1 spray into both nostrils daily as needed for allergies.     [name] gabapentin (NEURONTIN) 100 MG capsule Take 1 capsule (100 mg total) by mouth 3 (three) times daily. (Patient taking differently: Take 100 mg by mouth at bedtime. ) 90 capsule 0  . glimepiride (AMARYL) 2 MG tablet Take 2 mg by mouth at bedtime. Prn glucose levels    . guaiFENesin (MUCINEX) 600 MG 12 hr tablet Take 1 tablet (600 mg total) by mouth 2 (two) times daily. (Patient taking differently: Take 600 mg by mouth 2 (two) times daily as needed. )    . guaiFENesin-dextromethorphan (ROBITUSSIN DM) 100-10 MG/5ML syrup Take  15 mLs by mouth every 4 (four) hours as needed for cough. 118 mL 0  . lovastatin (MEVACOR) 20 MG tablet Take 1 tablet by mouth daily.    . Melatonin 5 MG TABS Take by mouth at bedtime.    . metoCLOPramide (REGLAN) 5 MG tablet Take 5 mg by mouth 3 (three) times daily before meals. Patient takes 1 tab with each meal.    . midodrine (PROAMATINE) 10 MG tablet Take 1 tablet (10 mg total) by mouth every dialysis (MWF).    [name] multivitamin (RENA-VIT) TABS tablet Take 1 tablet by mouth at bedtime.     [name] nystatin cream (MYCOSTATIN) Apply 1 application topically 2 (two) times daily as needed (irritation).     . polyethylene glycol (MIRALAX / GLYCOLAX) packet Take 17 g by mouth daily. (Patient taking differently: Take 17 g by mouth daily as needed. ) 14 each 0  . Polyvinyl Alcohol-Povidone PF (REFRESH) 1.4-0.6 % SOLN Apply 1 drop to eye 3 (three) times daily as needed (Dry eyes).    . silver sulfADIAZINE (SILVADENE) 1 % cream APPLY 1 APPLICATION TOPICALY DAILY (Patient taking differently: Apply 1 application topically daily as needed (sores). ) 50 g 1  . sodium chloride (OCEAN) 0.65 % SOLN nasal spray Place 1 spray into both nostrils as needed for congestion.  0  . insulin aspart (  NOVOLOG) 100 UNIT/ML injection Inject 4 Units into the skin 3 (three) times daily with meals. (Patient not taking: Reported on 03/08/2018) 10 mL 11  . insulin detemir (LEVEMIR) 100 UNIT/ML injection Inject 0.14 mLs (14 Units total) into the skin daily. (Patient not taking: Reported on 04/23/2018) 10 mL 11   No current facility-administered medications for this visit.     Past Medical History:  Diagnosis Date  . Anemia   . Arthritis   . Bladder cancer ([place])   . Chronic kidney disease   . Diabetes mellitus without complication ([place])   . Dyspnea    DOE  . ESRD on hemodialysis [facility])    Started dialysis in 2014, gets HD as of 2019 in [place] w/ Dr Rolly Salter on MWF schedule.    [name] GERD (gastroesophageal reflux disease)   . HOH  (hard of hearing)   . Hypertension   . IBS (irritable bowel syndrome)   . Lymphoma ([place]) 09/27/2014  . Neuropathy    RIGHT LEG  . Stroke [facility])    TIA    Past Surgical History:  Procedure Laterality Date  . BACK SURGERY  2007  . CATARACT EXTRACTION W/PHACO Left 03/23/2016   Procedure: CATARACT EXTRACTION PHACO AND INTRAOCULAR LENS PLACEMENT (IOC);  Surgeon: Leandrew Koyanagi, MD;  Location: [facility] ORS;  Service: Ophthalmology;  Laterality: Left;  [place] 52.6 AP% 14.7 CDE 7.72 Fluid pack lot # [id number]  . CATARACT EXTRACTION W/PHACO Right 05/30/2016   Procedure: CATARACT EXTRACTION PHACO AND INTRAOCULAR LENS PLACEMENT ([place]);  Surgeon: Leandrew Koyanagi, MD;  Location: [facility] ORS;  Service: Ophthalmology;  Laterality: Right;  [place] 01:08 AP% 13.9 CDE 9.53  note: could not get IV in patient, so procedure done without anesthesia personell present, ok per Dr [place] Going fluid pack lo t# [id number]  . CIRCUMCISION    . IVC FILTER INSERTION N/A 12/31/2017   Procedure: IVC FILTER INSERTION;  Surgeon: Algernon Huxley, MD;  Location: [place] CV LAB;  Service: Cardiovascular;  Laterality: N/A;  . PERIPHERAL VASCULAR CATHETERIZATION Left 08/19/2015   Procedure: A/V Shuntogram/Fistulagram;  Surgeon: Algernon Huxley, MD;  Location: [place] CV LAB;  Service: Cardiovascular;  Laterality: Left;  . PERIPHERAL VASCULAR CATHETERIZATION N/A 08/19/2015   Procedure: A/V Shunt Intervention;  Surgeon: Algernon Huxley, MD;  Location: [place] [facility];  Service: Cardiovascular;  Laterality: N/A;  . PORTA CATH INSERTION N/A 01/02/2018   Procedure: PORTA CATH INSERTION;  Surgeon: Katha Cabal, MD;  Location: [place] CV LAB;  Service: Cardiovascular;  Laterality: N/A;    Social History  Substance Use Topics  . Smoking status: Former Smoker    Types: Cigarettes    Quit date: 05/19/2013  . Smokeless tobacco: Never Used     Comment: quit  . Alcohol use No  No IVDU  Family History        Family History  Problem Relation Age of Onset  . Kidney cancer Neg Hx   . Prostate cancer Neg Hx   . Kidney failure Neg Hx   . Bladder Cancer Neg Hx          Allergies  Allergen Reactions  . Ibuprofen     DUE TO DIALYSIS  . Multivitamin [Centrum]     DUE TO DIALYSIS  . Daypro [Oxaprozin] Swelling and Rash    Other reaction(s): Other (See Comments)     REVIEW OF SYSTEMS(Negative unless checked)  Constitutional: [] ?Weight loss[] ?Fever[] ?Chills Cardiac:[] ?Chest pain[] ?Chest pressure[] ?Palpitations [] ?Shortness of breath when laying flat [] ?Shortness of breath at  rest [] ?Shortness of breath with exertion. Vascular: [] ?Pain in legs with walking[] ?Pain in legsat rest[] ?Pain in legs when laying flat [] ?Claudication [] ?Pain in feet when walking [] ?Pain in feet at rest [] ?Pain in feet when laying flat [] ?History of DVT [] ?Phlebitis [] ?Swelling in legs [] ?Varicose veins [] ?Non-healing ulcers Pulmonary: [] ?Uses home oxygen [] ?Productive cough[] ?Hemoptysis [] ?Wheeze [] ?COPD [] ?Asthma Neurologic: [] ?Dizziness [] ?Blackouts [] ?Seizures [x] ?History of stroke [] ?History of TIA[] ?Aphasia [] ?Temporary blindness[] ?Dysphagia [] ?Weaknessor numbness in arms [x] ?Weakness or numbnessin legs Musculoskeletal: [] ?Arthritis [] ?Joint swelling [] ?Joint pain [] ?Low back pain Hematologic:[] ?Easy bruising[] ?Easy bleeding [] ?Hypercoagulable state [] ?Anemic  Gastrointestinal:[] ?Blood in stool[] ?Vomiting blood[] ?Gastroesophageal reflux/heartburn[] ?Abdominal pain Genitourinary: [x] ?Chronic kidney disease [] ?Difficulturination [] ?Frequenturination [] ?Burning with urination[] ?Hematuria Skin: [] ?Rashes [] ?Ulcers [] ?Wounds Psychological: [] ?History of anxiety[] ?History of major depression.     Physical Examination  BP (!) 143/67 (BP Location: Right Arm, Patient Position:  Sitting, Cuff Size: Normal)   Pulse 69   Resp 16   Ht 5\' 11"  (1.803 m)   BMI 25.10 kg/m  Gen:  WD/WN, NAD.  Somewhat more frail and fragile than he has been previously. Head: [place]/AT, No temporalis wasting. Ear/Nose/Throat: Hearing somewhat diminished, nares w/o erythema or drainage Eyes: Conjunctiva clear. Sclera non-icteric Neck: Supple.  Trachea midline Pulmonary:  Good air movement, no use of accessory muscles.  Cardiac: RRR, no JVD Vascular: Aneurysmal left radiocephalic AV fistula without skin threat and with good thrill. Vessel Right Left  Radial Palpable Palpable                       Musculoskeletal: M/S 5/5 throughout.  No deformity or atrophy.  Using a wheelchair.  No appreciable lower extremity edema. Neurologic: Sensation grossly intact in extremities.  Symmetrical.  Speech is fluent.  Psychiatric: Judgment intact, Mood & affect appropriate for pt's clinical situation. Dermatologic: No rashes or ulcers noted.  No cellulitis or open wounds.       Labs Recent Results (from the past 2160 hour(s))  Sample to Blood Bank     Status: None   Collection Time: 01/31/18  8:54 AM  Result Value Ref Range   Blood Bank Specimen SAMPLE AVAILABLE FOR TESTING    Sample Expiration      02/03/2018 Performed at [facility], [address] [place], [place] [address]   Prepare RBC     Status: None   Collection Time: 01/31/18  8:54 AM  Result Value Ref Range   Order Confirmation      ORDER PROCESSED BY BLOOD BANK Performed at [facility], [address] [place], [place] [address]   Type and screen     Status: None   Collection Time: 01/31/18  8:54 AM  Result Value Ref Range   ABO/RH(D) B POS    Antibody Screen NEG    Sample Expiration 02/03/2018    Unit Number [id number]    Blood Component Type RED CELLS,LR    Unit division 00    Status of Unit ISSUED,FINAL    Transfusion Status OK TO TRANSFUSE    Crossmatch Result      Compatible Performed  at [facility], [address] [place], [place] [address]    Unit Number [id number]    Blood Component Type RED CELLS,LR    Unit division 00    Status of Unit ISSUED,FINAL    Transfusion Status OK TO TRANSFUSE    Crossmatch Result Compatible   BPAM RBC     Status: None   Collection Time: 01/31/18  8:54 AM  Result Value Ref Range   ISSUE DATE /  TIME [id number]    Blood Product Unit Number [id number]    PRODUCT CODE E0336V00    Unit Type and Rh 7300    Blood Product Expiration Date 932355732202    ISSUE DATE / TIME [id number]    Blood Product Unit Number [id number]    PRODUCT CODE E0382V00    Unit Type and Rh 7300    Blood Product Expiration Date [phone]   CBC with Differential/Platelet     Status: Abnormal   Collection Time: 01/31/18  9:05 AM  Result Value Ref Range   WBC 4.8 4.0 - 10.5 K/uL   RBC 1.84 (L) 4.22 - 5.81 MIL/uL   Hemoglobin 5.4 (L) 13.0 - 17.0 g/dL   HCT 17.9 (L) 39.0 - 52.0 %   MCV 97.3 80.0 - 100.0 fL   MCH 29.3 26.0 - 34.0 pg   MCHC 30.2 30.0 - 36.0 g/dL   RDW 20.1 (H) 11.5 - 15.5 %   Platelets 127 (L) 150 - 400 K/uL   nRBC 1.1 (H) 0.0 - 0.2 %   Neutrophils Relative % 81 %   Neutro Abs 3.9 1.7 - 7.7 K/uL   Lymphocytes Relative 16 %   Lymphs Abs 0.7 0.7 - 4.0 K/uL   Monocytes Relative 1 %   Monocytes Absolute 0.1 0.1 - 1.0 K/uL   Eosinophils Relative 0 %   Eosinophils Absolute 0.0 0.0 - 0.5 K/uL   Basophils Relative 0 %   Basophils Absolute 0.0 0.0 - 0.1 K/uL   Immature Granulocytes 2 %   Abs Immature Granulocytes 0.08 (H) 0.00 - 0.07 K/uL    Comment: Performed at [facility], [address] [place], [place] [address]  Type and screen [place]     Status: None   Collection Time: 02/19/18  8:59 AM  Result Value Ref Range   ABO/RH(D) B POS    Antibody Screen NEG    Sample Expiration 02/22/2018    Unit Number [id number]    Blood Component Type RED CELLS,LR    Unit division 00     Status of Unit ISSUED,FINAL    Transfusion Status OK TO TRANSFUSE    Crossmatch Result      Compatible Performed at [facility], [address], [place], [place] [address]    Unit Number [id number]    Blood Component Type RED CELLS,LR    Unit division 00    Status of Unit ISSUED,FINAL    Transfusion Status OK TO TRANSFUSE    Crossmatch Result Compatible   Hemoglobin     Status: Abnormal   Collection Time: 02/19/18  8:59 AM  Result Value Ref Range   Hemoglobin 6.0 (L) 13.0 - 17.0 g/dL    Comment: Performed at [facility], [address] [place], [place] [address]  BPAM RBC     Status: None   Collection Time: 02/19/18  8:59 AM  Result Value Ref Range   ISSUE DATE / TIME [id number]    Blood Product Unit Number [id number]    PRODUCT CODE E0336V00    Unit Type and Rh 7300    Blood Product Expiration Date [id number]    ISSUE DATE / TIME [id number]    Blood Product Unit Number [id number]    PRODUCT CODE [id number]    Unit Type and Rh 7300    Blood Product Expiration Date 683419622297   Glucose, capillary     Status: Abnormal   Collection Time: 02/19/18  9:20 AM  Result Value Ref Range  Glucose-Capillary 346 (H) 70 - 99 mg/dL   Comment 1 Call MD NNP PA CNM   Glucose, capillary     Status: Abnormal   Collection Time: 02/19/18  2:36 PM  Result Value Ref Range   Glucose-Capillary 237 (H) 70 - 99 mg/dL  Prepare RBC     Status: None   Collection Time: 02/19/18  4:11 PM  Result Value Ref Range   Order Confirmation      ORDER PROCESSED BY BLOOD BANK Performed at [facility], [address], [place], [place] [address]   Basic metabolic panel     Status: Abnormal   Collection Time: 03/08/18  8:24 PM  Result Value Ref Range   Sodium 137 135 - 145 mmol/L   Potassium 3.0 (L) 3.5 - 5.1 mmol/L   Chloride 98 98 - 111 mmol/L   CO2 26 22 - 32 mmol/L   Glucose, Bld 331 (H) 70 - 99 mg/dL   BUN 19 8 - 23 mg/dL   Creatinine, Ser 3.53 (H) 0.61  - 1.24 mg/dL   Calcium 8.3 (L) 8.9 - 10.3 mg/dL   GFR calc non Af Amer 16 (L) >60 mL/min   GFR calc Af Amer 19 (L) >60 mL/min   Anion gap 13 5 - 15    Comment: Performed at [facility], [address] [place], [place] [address]  CBC     Status: Abnormal   Collection Time: 03/08/18  8:24 PM  Result Value Ref Range   WBC 4.6 4.0 - 10.5 K/uL   RBC 3.13 (L) 4.22 - 5.81 MIL/uL   Hemoglobin 9.0 (L) 13.0 - 17.0 g/dL   HCT 29.0 (L) 39.0 - 52.0 %   MCV 92.7 80.0 - 100.0 fL   MCH 28.8 26.0 - 34.0 pg   MCHC 31.0 30.0 - 36.0 g/dL   RDW 19.0 (H) 11.5 - 15.5 %   Platelets 94 (L) 150 - 400 K/uL    Comment: Immature Platelet Fraction may be clinically indicated, consider ordering this additional test [id number]    nRBC 0.0 0.0 - 0.2 %    Comment: Performed at [facility], [address] [place], [place] [address]  Troponin I - ONCE - STAT     Status: Abnormal   Collection Time: 03/08/18  8:24 PM  Result Value Ref Range   Troponin I 0.03 (HH) <0.03 ng/mL    Comment: CRITICAL RESULT CALLED TO, READ BACK BY AND VERIFIED WITH CHRISTINA JAMES ON 03/08/18 AT 2059 [facility] Performed at [facility], [address], [place], [place] [address]   Brain natriuretic peptide     Status: Abnormal   Collection Time: 03/08/18  8:24 PM  Result Value Ref Range   B Natriuretic Peptide 316.0 (H) 0.0 - 100.0 pg/mL    Comment: Performed at [facility], [address] [place],  [address]  Blood gas, venous     Status: Abnormal   Collection Time: 03/08/18  8:24 PM  Result Value Ref Range   pH, Ven 7.59 (H) 7.250 - 7.430   pCO2, Ven 29 (L) 44.0 - 60.0 mmHg   pO2, Ven 33.0 32.0 - 45.0 mmHg   Bicarbonate 27.8 20.0 - 28.0 mmol/L   Acid-Base Excess 6.6 (H) 0.0 - 2.0 mmol/L   O2 Saturation 76.2 %   Patient temperature 37.0    Sample type VENOUS     Comment: Performed at [facility], [address], [place], [place] [address]  Glucose, capillary  Status:  Abnormal   Collection Time: 03/08/18  8:29 PM  Result Value Ref Range   Glucose-Capillary 306 (H) 70 - 99 mg/dL  CBC with Differential     Status: Abnormal   Collection Time: 03/22/18  7:32 PM  Result Value Ref Range   WBC 4.6 4.0 - 10.5 K/uL   RBC 3.47 (L) 4.22 - 5.81 MIL/uL   Hemoglobin 10.1 (L) 13.0 - 17.0 g/dL   HCT 32.2 (L) 39.0 - 52.0 %   MCV 92.8 80.0 - 100.0 fL   MCH 29.1 26.0 - 34.0 pg   MCHC 31.4 30.0 - 36.0 g/dL   RDW 19.7 (H) 11.5 - 15.5 %   Platelets 99 (L) 150 - 400 K/uL    Comment: Immature Platelet Fraction may be clinically indicated, consider ordering this additional test [id number]    nRBC 0.0 0.0 - 0.2 %   Neutrophils Relative % 46 %   Lymphocytes Relative 50 %   Monocytes Relative 1 %   Eosinophils Relative 3 %   Basophils Relative 0 %   Band Neutrophils 0 %   Metamyelocytes Relative 0 %   Myelocytes 0 %   Promyelocytes Relative 0 %   Blasts 0 %   nRBC 0 0 /100 WBC   Other 0 %   Neutro Abs 2.1 1.7 - 7.7 K/uL   Lymphs Abs 2.4 0.7 - 4.0 K/uL   Monocytes Absolute 0.0 (L) 0.1 - 1.0 K/uL   Eosinophils Absolute 0.1 0.0 - 0.5 K/uL   Basophils Absolute 0.0 0.0 - 0.1 K/uL   WBC Morphology SMUDGE CELLS     Comment: Performed at [facility], [address] [place], [place] [address]  Basic metabolic panel     Status: Abnormal   Collection Time: 03/22/18  7:32 PM  Result Value Ref Range   Sodium 136 135 - 145 mmol/L   Potassium 2.9 (L) 3.5 - 5.1 mmol/L   Chloride 98 98 - 111 mmol/L   CO2 23 22 - 32 mmol/L   Glucose, Bld 234 (H) 70 - 99 mg/dL   BUN 21 8 - 23 mg/dL   Creatinine, Ser 4.04 (H) 0.61 - 1.24 mg/dL   Calcium 8.5 (L) 8.9 - 10.3 mg/dL   GFR calc non Af Amer 14 (L) >60 mL/min   GFR calc Af Amer 16 (L) >60 mL/min   Anion gap 15 5 - 15    Comment: Performed at [facility], [address] [place], [place] [address]  Troponin I - ONCE - STAT     Status: None   Collection Time: 03/22/18  7:32 PM  Result Value Ref Range    Troponin I <0.03 <0.03 ng/mL    Comment: Performed at [facility], [address] [place], [place] [address]    Radiology No results found.  Assessment/Plan Diabetes mellitus ([place]) blood glucose control important in reducing the progression of atherosclerotic disease. Also, involved in wound healing. On appropriate medications.   HLD (hyperlipidemia) lipid control important in reducing the progression of atherosclerotic disease. Continue statin therapy   Essential (primary) hypertension blood pressure control important in reducing the progression of atherosclerotic disease. On appropriate oral medications.  S/P IVC filter Given his debilitated status and inability to tolerate anticoagulation, his filter will be left in place indefinitely.  End-stage renal disease on hemodialysis ([place]) The fistula is aneurysmal but widely patent on duplex with no focal stenosis identified. As long as they continue to rotate the access sites, I  would expect this fistula to continue to work well.  This can be rechecked in 6 to 9 months with duplex.    Leotis Pain, MD  04/23/2018 3:05 PM    This note was created with Dragon medical transcription system.  Any errors from dictation are purely unintentional

## 2018-05-14 ENCOUNTER — Encounter: Payer: Self-pay | Admitting: Podiatry

## 2018-05-14 ENCOUNTER — Ambulatory Visit: Payer: Medicare HMO | Admitting: Podiatry

## 2018-05-14 DIAGNOSIS — L989 Disorder of the skin and subcutaneous tissue, unspecified: Secondary | ICD-10-CM | POA: Diagnosis not present

## 2018-05-14 DIAGNOSIS — B351 Tinea unguium: Secondary | ICD-10-CM | POA: Diagnosis not present

## 2018-05-14 DIAGNOSIS — M79676 Pain in unspecified toe(s): Secondary | ICD-10-CM | POA: Diagnosis not present

## 2018-05-14 DIAGNOSIS — E0842 Diabetes mellitus due to underlying condition with diabetic polyneuropathy: Secondary | ICD-10-CM

## 2018-05-19 NOTE — Progress Notes (Signed)
    Subjective: Patient is a 73 y.o. male presenting to the office today for follow up evaluation of a painful callus lesion to the sub-fifth MPJ. Walking and bearing weight increases the pain. He has not had any recent treatment.   Patient also complains of elongated, thickened nails that cause pain while ambulating in shoes. He is unable to trim his own nails. Patient presents today for further treatment and evaluation.  Past Medical History:  Diagnosis Date  . Anemia   . Arthritis   . Bladder cancer ([place])   . Chronic kidney disease   . Diabetes mellitus without complication ([place])   . Dyspnea    DOE  . ESRD on hemodialysis [facility])    Started dialysis in 2014, gets HD as of 2019 in [place] w/ Dr Rolly Salter on MWF schedule.    [name] GERD (gastroesophageal reflux disease)   . HOH (hard of hearing)   . Hypertension   . IBS (irritable bowel syndrome)   . Lymphoma ([place]) 09/27/2014  . Neuropathy    RIGHT LEG  . Stroke [facility])    TIA    Objective:  Physical Exam General: Alert and oriented x3 in no acute distress  Dermatology: Hyperkeratotic lesion present on the left sub-fifth MPJ. Pain on palpation with a central nucleated core noted. Skin is warm, dry and supple bilateral lower extremities. Negative for open lesions or macerations. Nails are tender, long, thickened and dystrophic with subungual debris, consistent with onychomycosis, 1-5 bilateral. No signs of infection noted.  Vascular: Palpable pedal pulses bilaterally. No edema or erythema noted. Capillary refill within normal limits.  Neurological: Epicritic and protective threshold diminished bilaterally.   Musculoskeletal Exam: Pain on palpation at the keratotic lesion noted. Range of motion within normal limits bilateral. Muscle strength 5/5 in all groups bilateral.  Assessment: 1. Onychodystrophic nails 1-5 bilateral with hyperkeratosis of nails.  2. Onychomycosis of nail due to dermatophyte bilateral 3. Pre-ulcerative callus  lesion noted to the sub-fifth MPJ of the left foot   Plan of Care:  1. Patient evaluated. 2. Excisional debridement of keratoic lesion using a chisel blade was performed without incident.  3. Dressed with light dressing. 4. Mechanical debridement of nails 1-5 bilaterally performed using a nail nipper. Filed with dremel without incident.  5. DM shoes dispensed today with insoles x 3 pair.  6. Patient is to return to the clinic as needed.   Edrick Kins, DPM [facility]  Dr. Edrick Kins, [place]                                        [place], [place] [address]                Office [phone]  Fax [phone]

## 2018-05-21 ENCOUNTER — Inpatient Hospital Stay: Payer: Medicare HMO

## 2018-05-21 ENCOUNTER — Encounter: Payer: Self-pay | Admitting: Internal Medicine

## 2018-05-21 ENCOUNTER — Inpatient Hospital Stay: Payer: Medicare HMO | Attending: Internal Medicine | Admitting: Internal Medicine

## 2018-05-21 DIAGNOSIS — Z95828 Presence of other vascular implants and grafts: Secondary | ICD-10-CM

## 2018-05-21 DIAGNOSIS — C8303 Small cell B-cell lymphoma, intra-abdominal lymph nodes: Secondary | ICD-10-CM

## 2018-05-21 DIAGNOSIS — C8308 Small cell B-cell lymphoma, lymph nodes of multiple sites: Secondary | ICD-10-CM

## 2018-05-21 DIAGNOSIS — D696 Thrombocytopenia, unspecified: Secondary | ICD-10-CM

## 2018-05-21 DIAGNOSIS — N19 Unspecified kidney failure: Secondary | ICD-10-CM

## 2018-05-21 DIAGNOSIS — N186 End stage renal disease: Secondary | ICD-10-CM | POA: Diagnosis not present

## 2018-05-21 DIAGNOSIS — Z992 Dependence on renal dialysis: Secondary | ICD-10-CM | POA: Diagnosis not present

## 2018-05-21 DIAGNOSIS — I120 Hypertensive chronic kidney disease with stage 5 chronic kidney disease or end stage renal disease: Secondary | ICD-10-CM | POA: Diagnosis not present

## 2018-05-21 DIAGNOSIS — C679 Malignant neoplasm of bladder, unspecified: Secondary | ICD-10-CM | POA: Insufficient documentation

## 2018-05-21 DIAGNOSIS — E1122 Type 2 diabetes mellitus with diabetic chronic kidney disease: Secondary | ICD-10-CM | POA: Insufficient documentation

## 2018-05-21 MED ORDER — HEPARIN SOD (PORK) LOCK FLUSH 100 UNIT/ML IV SOLN
500.0000 [IU] | Freq: Once | INTRAVENOUS | Status: AC
  Administered 2018-05-21: 500 [IU]
  Filled 2018-05-21: qty 5

## 2018-05-21 MED ORDER — SODIUM CHLORIDE 0.9% FLUSH
10.0000 mL | Freq: Once | INTRAVENOUS | Status: AC
  Administered 2018-05-21: 10 mL via INTRAVENOUS
  Filled 2018-05-21: qty 10

## 2018-05-21 NOTE — Progress Notes (Signed)
[place] OFFICE PROGRESS NOTE  Patient Care Team: Albina Billet, MD as PCP - General (Internal Medicine)  Cancer Staging No matching staging information was found for the patient.   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 Bone marrow biopsy on 03/05/13 with mildly hypercellular marrow for age 73-50% with interstitial predominantly small B-lymphocytic infiltrate estimated about 20-30% of marrow cells, mild nonspecific dyserythropoiesis, storage iron present this. Flow cytometry showed 29% CD5+ clonal B-cell population which is CD45+, CD5+, CD19+, CD20+, CD22+, CD23+, CD38+, HLA-DR+, Slg kappa+, and is CD10-, CD11b-, FMC7-. 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 PET scan on 03/03/13 with extensive hypermetabolic lymphadenopathy.  (presented with progressive Thrombocytopenia with persistent Anemia. CBC on 01/16/13 showed hemoglobin 10.0, MCV 98, platelets 80, WBC 4890 with 40% neutrophils, 54% lymphocytes, 1.4% monocytes. On Epogen and Venofer at dialysis treatments. Further workup showed small M-spike of 0.2).   Got Treanda/Rituxan x2 cycles in Dec 2014/Jan 2015. Then on single agent Rituxan q 8 weeks (got #3 dose on 09/16/13). --------------------------------------------------------------- ----------------------  # DEC 2014- SMALL CELL LYMPHOMA/CLL- STAGE IV [BMBx- 03/2013- 20-30% B cell infiltrate; CD 5+/CD 20+]; FISH- Trisomy 12;DEC 2014-Treanda-Rituxan x2 [held sec to prolonged hospital/pneumoani/HD]; Rituxa q8W [last June 2015; Held sec to Superficial Bladder ca]; CT- JUNE 2017- STABLE Mediastinal LN/supraclav LN/pelvic LN; CT DEC 2017- mild progression noted ~1.5-2cm mediastinal/Ax LN. Cont surveillance  # SEP 2018- PROGRESSION on PET scan-  # SEP 27th 2018- Ibrutinib 280 mg/day [after HD]; stopped Jan 2019- sec to severe anemia 6.7/easy bruising; Hb 9-10 off ibrutinib; II opinion at  [facility]/Dr.Coombs [223m/day x3 times a week]; DISCONTINUED in SEP 2019 [sec to intol]  # 784mRLL lung nodule- F/u in 6 M [Hx of smoking quit 2015]  # ESRD on HD [Dr.Voora/Dr.Kolluru]; DM; Non-healing ulcers in feet/Hx R foot drop- walker ambulation  #Noninvasive Bladder cancer/recent UTIs-currently resolved. ------------------------------------------------------------   DIAGNOSIS: [ 2014] SLL/CLL  STAGE: IV  ;GOALS: Control  CURRENT/MOST RECENT THERAPY: Surveillance.       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  male pleasant patient above history of CLL/SLL currently on surveillance is here for follow-up.  Patient has been taken off ibrutinib because of poor tolerance easy bruising.   Patient last received PRBC blood transfusion in late December 2019.  Otherwise he continues to get Procrit/iron IV through nephrology.  Patient states he feels good off ibrutinib.  Continues to have chronic mild swelling in his left neck and right neck not any worse.  No worsening bleeding.  Review of Systems  Constitutional: Positive for malaise/fatigue. Negative for chills, diaphoresis, fever and weight loss.  HENT: Negative for nosebleeds and sore throat.   Eyes: Negative for double vision.  Respiratory: Negative for cough, hemoptysis, sputum production, shortness of breath and wheezing.   Cardiovascular: Negative for chest pain, palpitations, orthopnea and leg swelling.  Gastrointestinal: Negative for abdominal pain, blood in stool, constipation, diarrhea, heartburn, melena, nausea and vomiting.  Genitourinary: Negative for dysuria, frequency and urgency.  Musculoskeletal: Positive for back pain and joint pain.  Skin: Negative.  Negative for itching and rash.  Neurological: Negative for dizziness, tingling, focal weakness, weakness and headaches.  Endo/Heme/Allergies: Does not bruise/bleed easily.   Psychiatric/Behavioral: Negative for depression. The patient does not have insomnia.       PAST MEDICAL HISTORY :  Past Medical History:  Diagnosis Date  .  Anemia   . Arthritis   . Bladder cancer ([place])   . Chronic kidney disease   . Diabetes mellitus without complication ([place])   . Dyspnea    DOE  . ESRD on hemodialysis [facility])    Started dialysis in 2014, gets HD as of 2019 in [place] w/ Dr Rolly Salter on MWF schedule.    Mark [name] GERD (gastroesophageal reflux disease)   . HOH (hard of hearing)   . Hypertension   . IBS (irritable bowel syndrome)   . Lymphoma ([place]) 09/27/2014  . Neuropathy    RIGHT LEG  . Stroke [facility])    TIA    PAST SURGICAL HISTORY :   Past Surgical History:  Procedure Laterality Date  . BACK SURGERY  2007  . CATARACT EXTRACTION W/PHACO Left 03/23/2016   Procedure: CATARACT EXTRACTION PHACO AND INTRAOCULAR LENS PLACEMENT (IOC);  Surgeon: Leandrew Koyanagi, MD;  Location: [facility] ORS;  Service: Ophthalmology;  Laterality: Left;  [place] 52.6 AP% 14.7 CDE 7.72 Fluid pack lot # [id number]  . CATARACT EXTRACTION W/PHACO Right 05/30/2016   Procedure: CATARACT EXTRACTION PHACO AND INTRAOCULAR LENS PLACEMENT ([place]);  Surgeon: Leandrew Koyanagi, MD;  Location: [facility] ORS;  Service: Ophthalmology;  Laterality: Right;  [place] 01:08 AP% 13.9 CDE 9.53  note: could not get IV in patient, so procedure done without anesthesia personell present, ok per Dr Wallace Going fluid pack lo t# [id number]  . CIRCUMCISION    . IVC FILTER INSERTION N/A 12/31/2017   Procedure: IVC FILTER INSERTION;  Surgeon: Algernon Huxley, MD;  Location: [facility];  Service: Cardiovascular;  Laterality: N/A;  . PERIPHERAL VASCULAR CATHETERIZATION Left 08/19/2015   Procedure: A/V Shuntogram/Fistulagram;  Surgeon: Algernon Huxley, MD;  Location: [facility];  Service: Cardiovascular;  Laterality: Left;  . PERIPHERAL VASCULAR CATHETERIZATION N/A 08/19/2015   Procedure: A/V Shunt Intervention;  Surgeon: Algernon Huxley, MD;  Location: [facility];  Service: Cardiovascular;  Laterality: N/A;  . PORTA CATH INSERTION N/A 01/02/2018   Procedure: PORTA CATH INSERTION;  Surgeon: Katha Cabal, MD;  Location: [facility] [facility];  Service: Cardiovascular;  Laterality: N/A;    FAMILY HISTORY :   Family History  Problem Relation Age of Onset  . Cancer Mother   . Kidney cancer Neg Hx   . Prostate cancer Neg Hx   . Kidney failure Neg Hx   . Bladder Cancer Neg Hx     SOCIAL HISTORY:   Social History   Tobacco Use  . Smoking status: Former Smoker    Types: Cigarettes    Last attempt to quit: 05/19/2013    Years since quitting: 5.0  . Smokeless tobacco: Never Used  . Tobacco comment: quit  Substance Use Topics  . Alcohol use: No    Alcohol/week: 0.0 standard drinks  . Drug use: No    ALLERGIES:  is allergic to acyclovir and related; heparin; ibuprofen; multivitamin [centrum]; daypro [oxaprozin]; and tape.  MEDICATIONS:  Current Outpatient Medications  Medication Sig Dispense Refill  . albuterol (PROVENTIL) (2.5 MG/3ML) 0.083% nebulizer solution Take 2.5 mg by nebulization every 6 (six) hours as needed for wheezing or shortness of breath.    . Difluprednate (DUREZOL) 0.05 % EMUL Place 1 drop into the left eye 2 (two) times daily.     Mark [name] docusate sodium (COLACE) 100 MG capsule Take 100 mg by mouth daily as needed for mild constipation.    . ENSURE PLUS (ENSURE PLUS) LIQD Take 237 mLs by mouth.    Mark [name]  fluticasone (FLONASE) 50 MCG/ACT nasal spray Place 1 spray into both nostrils daily as needed for allergies.     Mark [name] gabapentin (NEURONTIN) 100 MG capsule Take 1 capsule (100 mg total) by mouth 3 (three) times daily. (Patient taking differently: Take 100 mg by mouth at bedtime. ) 90 capsule 0  . glimepiride (AMARYL) 2 MG tablet Take 2 mg by mouth at bedtime. Prn glucose levels    . guaiFENesin (MUCINEX) 600 MG 12 hr tablet Take 1 tablet (600 mg total) by mouth 2 (two) times daily. (Patient taking  differently: Take 600 mg by mouth 2 (two) times daily as needed. )    . guaiFENesin-dextromethorphan (ROBITUSSIN DM) 100-10 MG/5ML syrup Take 15 mLs by mouth every 4 (four) hours as needed for cough. 118 mL 0  . insulin aspart (NOVOLOG) 100 UNIT/ML injection Inject 4 Units into the skin 3 (three) times daily with meals. (Patient not taking: Reported on 03/08/2018) 10 mL 11  . insulin detemir (LEVEMIR) 100 UNIT/ML injection Inject 0.14 mLs (14 Units total) into the skin daily. (Patient not taking: Reported on 04/23/2018) 10 mL 11  . lovastatin (MEVACOR) 20 MG tablet Take 1 tablet by mouth daily.    . Melatonin 5 MG TABS Take by mouth at bedtime.    . metoCLOPramide (REGLAN) 5 MG tablet Take 5 mg by mouth 3 (three) times daily before meals. Patient takes 1 tab with each meal.    . midodrine (PROAMATINE) 10 MG tablet Take 1 tablet (10 mg total) by mouth every dialysis (MWF).    Mark [name] multivitamin (RENA-VIT) TABS tablet Take 1 tablet by mouth at bedtime.     Mark [name] nystatin cream (MYCOSTATIN) Apply 1 application topically 2 (two) times daily as needed (irritation).     . polyethylene glycol (MIRALAX / GLYCOLAX) packet Take 17 g by mouth daily. (Patient taking differently: Take 17 g by mouth daily as needed. ) 14 each 0  . Polyvinyl Alcohol-Povidone PF (REFRESH) 1.4-0.6 % SOLN Apply 1 drop to eye 3 (three) times daily as needed (Dry eyes).    . silver sulfADIAZINE (SILVADENE) 1 % cream APPLY 1 APPLICATION TOPICALY DAILY (Patient taking differently: Apply 1 application topically daily as needed (sores). ) 50 g 1  . sodium chloride (OCEAN) 0.65 % SOLN nasal spray Place 1 spray into both nostrils as needed for congestion.  0   No current facility-administered medications for this visit.     PHYSICAL EXAMINATION: ECOG PERFORMANCE STATUS: 1 - Symptomatic but completely ambulatory  BP (!) 177/75 (BP Location: Left Arm, Patient Position: Sitting, Cuff Size: Normal)   Pulse 65   Temp 97.6 F (36.4 C) (Tympanic)    Resp 16   There were no vitals filed for this visit.  Physical Exam  Constitutional: He is oriented to person, place, and time and well-developed, well-nourished, and in no distress.  In a wheel chair; accompanied by family.   HENT:  Head: Normocephalic and atraumatic.  Mouth/Throat: Oropharynx is clear and moist. No oropharyngeal exudate.  Eyes: Pupils are equal, round, and reactive to light.  Neck: Normal range of motion. Neck supple.  Bilateral approximately 1 cm lymph nodes felt in the neck left more than right.  Cardiovascular: Normal rate and regular rhythm.  Pulmonary/Chest: No respiratory distress. He has no wheezes.  Abdominal: Soft. Bowel sounds are normal. He exhibits no distension and no mass. There is no abdominal tenderness. There is no rebound and no guarding.  Musculoskeletal: Normal range of motion.  General: No tenderness or edema.  Neurological: He is alert and oriented to person, place, and time.  Skin: Skin is warm.  Psychiatric: Affect normal.  ;    LABORATORY DATA:  I have reviewed the data as listed    Component Value Date/Time   NA 136 03/22/2018 1932   NA 140 11/18/2013 1030   K 2.9 (L) 03/22/2018 1932   K 4.0 11/18/2013 1030   CL 98 03/22/2018 1932   CL 97 (L) 11/18/2013 1030   CO2 23 03/22/2018 1932   CO2 33 (H) 11/18/2013 1030   GLUCOSE 234 (H) 03/22/2018 1932   GLUCOSE 265 (H) 11/18/2013 1030   BUN 21 03/22/2018 1932   BUN 30 (H) 11/18/2013 1030   CREATININE 4.04 (H) 03/22/2018 1932   CREATININE 5.05 (H) 11/18/2013 1030   CALCIUM 8.5 (L) 03/22/2018 1932   CALCIUM 8.6 11/18/2013 1030   PROT 5.2 (L) 01/18/2018 0327   PROT 7.0 11/18/2013 1030   ALBUMIN 2.6 (L) 01/18/2018 0327   ALBUMIN 3.7 11/18/2013 1030   AST 13 (L) 01/18/2018 0327   AST 15 11/18/2013 1030   ALT 15 01/18/2018 0327   ALT 25 11/18/2013 1030   ALKPHOS 50 01/18/2018 0327   ALKPHOS 105 11/18/2013 1030   BILITOT 0.9 01/18/2018 0327   BILITOT 0.3 11/18/2013 1030    GFRNONAA 14 (L) 03/22/2018 1932   GFRNONAA 11 (L) 11/18/2013 1030   GFRAA 16 (L) 03/22/2018 1932   GFRAA 13 (L) 11/18/2013 1030    No results found for: SPEP, UPEP  Lab Results  Component Value Date   WBC 4.6 03/22/2018   NEUTROABS 2.1 03/22/2018   HGB 10.1 (L) 03/22/2018   HCT 32.2 (L) 03/22/2018   MCV 92.8 03/22/2018   PLT 99 (L) 03/22/2018      Chemistry      Component Value Date/Time   NA 136 03/22/2018 1932   NA 140 11/18/2013 1030   K 2.9 (L) 03/22/2018 1932   K 4.0 11/18/2013 1030   CL 98 03/22/2018 1932   CL 97 (L) 11/18/2013 1030   CO2 23 03/22/2018 1932   CO2 33 (H) 11/18/2013 1030   BUN 21 03/22/2018 1932   BUN 30 (H) 11/18/2013 1030   CREATININE 4.04 (H) 03/22/2018 1932   CREATININE 5.05 (H) 11/18/2013 1030      Component Value Date/Time   CALCIUM 8.5 (L) 03/22/2018 1932   CALCIUM 8.6 11/18/2013 1030   ALKPHOS 50 01/18/2018 0327   ALKPHOS 105 11/18/2013 1030   AST 13 (L) 01/18/2018 0327   AST 15 11/18/2013 1030   ALT 15 01/18/2018 0327   ALT 25 11/18/2013 1030   BILITOT 0.9 01/18/2018 0327   BILITOT 0.3 11/18/2013 1030       RADIOGRAPHIC STUDIES: I have personally reviewed the radiological images as listed and agreed with the findings in the report. No results found.   ASSESSMENT & PLAN:  Small B-cell lymphoma of intra-abdominal lymph nodes ([place]) # SLL/CLL- stage IV-currently off ibrutinib [secondary to intolerance fatigue easy bruising]/ on surveillance.   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  Monitor for now.  # Hb- 10-11; Iron sat- 17%; continue poor IV iron as per nephrology.  # Headaches- frontal; defer to nephrology/PCP.  # End-stage renal disease on hemodialysis; tolerating well.  Stable  # Left eye keratitis-on three times acyclovir stable  #IV access port flush every 2 months.  #Discussed with Dr. Juleen China nephrology.  # port flush today if possible #  port flush in 2 months # follow up in 4  months-MD labs- cbc/LDH/port flush- Dr.B  Cc; Dr.Kolluru/ [facility] Busing.    No orders of the defined types were placed in this encounter.  All questions were answered. The patient knows to call the clinic with any problems, questions or concerns.      Cammie Sickle, MD 05/26/2018 4:47 PM

## 2018-05-21 NOTE — Assessment & Plan Note (Addendum)
#   SLL/CLL- stage IV-currently off ibrutinib [secondary to intolerance fatigue easy bruising]/ on surveillance.   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  Monitor for now.  # Hb- 10-11; Iron sat- 17%; continue poor IV iron as per nephrology.  # Headaches- frontal; defer to nephrology/PCP.  # End-stage renal disease on hemodialysis; tolerating well.  Stable  # Left eye keratitis-on three times acyclovir stable  #IV access port flush every 2 months.  #Discussed with Dr. Juleen China nephrology.  # port flush today if possible # port flush in 2 months # follow up in 4 months-MD labs- cbc/LDH/port flush- Dr.B  Cc; Dr.Kolluru/ [facility] Busing.

## 2018-06-07 ENCOUNTER — Telehealth: Payer: Self-pay | Admitting: Nurse Practitioner

## 2018-06-07 NOTE — Telephone Encounter (Signed)
I called to schedule f/u in-home PC visit, message left to return call with contact information

## 2018-06-12 ENCOUNTER — Telehealth: Payer: Self-pay

## 2018-06-12 NOTE — Telephone Encounter (Signed)
Call to schedule palliative follow up appt no answer, message left

## 2018-07-23 ENCOUNTER — Inpatient Hospital Stay: Payer: 59 | Attending: Internal Medicine

## 2018-07-23 ENCOUNTER — Other Ambulatory Visit: Payer: Self-pay

## 2018-07-23 DIAGNOSIS — Z95828 Presence of other vascular implants and grafts: Secondary | ICD-10-CM

## 2018-07-23 DIAGNOSIS — Z452 Encounter for adjustment and management of vascular access device: Secondary | ICD-10-CM | POA: Insufficient documentation

## 2018-07-23 DIAGNOSIS — C8308 Small cell B-cell lymphoma, lymph nodes of multiple sites: Secondary | ICD-10-CM | POA: Diagnosis present

## 2018-07-23 MED ORDER — SODIUM CHLORIDE 0.9% FLUSH
10.0000 mL | Freq: Once | INTRAVENOUS | Status: AC
  Administered 2018-07-23: 13:00:00 10 mL via INTRAVENOUS
  Filled 2018-07-23: qty 10

## 2018-08-07 ENCOUNTER — Other Ambulatory Visit: Payer: Self-pay | Admitting: Neurology

## 2018-08-07 DIAGNOSIS — G44201 Tension-type headache, unspecified, intractable: Secondary | ICD-10-CM

## 2018-08-13 ENCOUNTER — Ambulatory Visit: Payer: Medicare HMO | Admitting: Podiatry

## 2018-08-19 ENCOUNTER — Ambulatory Visit: Payer: Medicare HMO

## 2018-08-20 ENCOUNTER — Other Ambulatory Visit: Payer: Self-pay

## 2018-08-20 ENCOUNTER — Ambulatory Visit: Payer: Medicare HMO | Admitting: Podiatry

## 2018-08-20 ENCOUNTER — Encounter: Payer: Self-pay | Admitting: Podiatry

## 2018-08-20 DIAGNOSIS — M79676 Pain in unspecified toe(s): Secondary | ICD-10-CM

## 2018-08-20 DIAGNOSIS — E0842 Diabetes mellitus due to underlying condition with diabetic polyneuropathy: Secondary | ICD-10-CM | POA: Diagnosis not present

## 2018-08-20 DIAGNOSIS — L989 Disorder of the skin and subcutaneous tissue, unspecified: Secondary | ICD-10-CM

## 2018-08-20 DIAGNOSIS — B351 Tinea unguium: Secondary | ICD-10-CM

## 2018-08-22 NOTE — Progress Notes (Signed)
    Subjective: Patient is a 73 y.o. male presenting to the office today for follow up evaluation of a painful callus lesion to the sub-fifth MPJ. Walking and bearing weight increases the pain. He has not had any recent treatment.   Patient also complains of elongated, thickened nails that cause pain while ambulating in shoes. He is unable to trim his own nails. Patient presents today for further treatment and evaluation.  Past Medical History:  Diagnosis Date  . Anemia   . Arthritis   . Bladder cancer ([place])   . Chronic kidney disease   . Diabetes mellitus without complication ([place])   . Dyspnea    DOE  . ESRD on hemodialysis [facility])    Started dialysis in 2014, gets HD as of 2019 in [place] w/ Dr Rolly Salter on MWF schedule.    [name] GERD (gastroesophageal reflux disease)   . HOH (hard of hearing)   . Hypertension   . IBS (irritable bowel syndrome)   . Lymphoma ([place]) 09/27/2014  . Neuropathy    RIGHT LEG  . Stroke [facility])    TIA    Objective:  Physical Exam General: Alert and oriented x3 in no acute distress  Dermatology: Hyperkeratotic lesion present on the left sub-fifth MPJ. Pain on palpation with a central nucleated core noted. Skin is warm, dry and supple bilateral lower extremities. Negative for open lesions or macerations. Nails are tender, long, thickened and dystrophic with subungual debris, consistent with onychomycosis, 1-5 bilateral. No signs of infection noted.  Vascular: Palpable pedal pulses bilaterally. No edema or erythema noted. Capillary refill within normal limits.  Neurological: Epicritic and protective threshold diminished bilaterally.   Musculoskeletal Exam: Pain on palpation at the keratotic lesion noted. Range of motion within normal limits bilateral. Muscle strength 5/5 in all groups bilateral.  Assessment: 1. Onychodystrophic nails 1-5 bilateral with hyperkeratosis of nails.  2. Onychomycosis of nail due to dermatophyte bilateral 3. Pre-ulcerative callus  lesion noted to the sub-fifth MPJ of the left foot   Plan of Care:  1. Patient evaluated. 2. Excisional debridement of keratoic lesion using a chisel blade was performed without incident.  3. Dressed with light dressing. 4. Mechanical debridement of nails 1-5 bilaterally performed using a nail nipper. Filed with dremel without incident.  5. DM shoes dispensed today with insoles x 3 pair.  6. Patient is to return to the clinic as needed.   Edrick Kins, DPM [facility]  Dr. Edrick Kins, [address]                                        [place], [place] [address]                Office [phone]  Fax [phone]

## 2018-08-29 ENCOUNTER — Other Ambulatory Visit: Payer: Self-pay

## 2018-08-29 ENCOUNTER — Ambulatory Visit
Admission: RE | Admit: 2018-08-29 | Discharge: 2018-08-29 | Disposition: A | Discharge status: Home / Self Care | Payer: Medicare HMO | Source: Ambulatory Visit | Attending: Neurology | Admitting: Neurology

## 2018-08-29 DIAGNOSIS — G44201 Tension-type headache, unspecified, intractable: Secondary | ICD-10-CM | POA: Diagnosis not present

## 2018-09-17 ENCOUNTER — Other Ambulatory Visit: Payer: Self-pay

## 2018-09-17 ENCOUNTER — Inpatient Hospital Stay: Payer: Medicare HMO | Attending: Internal Medicine

## 2018-09-17 ENCOUNTER — Inpatient Hospital Stay (HOSPITAL_BASED_OUTPATIENT_CLINIC_OR_DEPARTMENT_OTHER): Payer: Medicare HMO | Admitting: Internal Medicine

## 2018-09-17 ENCOUNTER — Other Ambulatory Visit: Payer: Self-pay | Admitting: *Deleted

## 2018-09-17 VITALS — BP 121/70 | HR 74 | Temp 96.4°F | Resp 18 | Wt 194.0 lb

## 2018-09-17 DIAGNOSIS — Z79899 Other long term (current) drug therapy: Secondary | ICD-10-CM | POA: Insufficient documentation

## 2018-09-17 DIAGNOSIS — Z8551 Personal history of malignant neoplasm of bladder: Secondary | ICD-10-CM | POA: Insufficient documentation

## 2018-09-17 DIAGNOSIS — R222 Localized swelling, mass and lump, trunk: Secondary | ICD-10-CM | POA: Insufficient documentation

## 2018-09-17 DIAGNOSIS — M1990 Unspecified osteoarthritis, unspecified site: Secondary | ICD-10-CM

## 2018-09-17 DIAGNOSIS — R51 Headache: Secondary | ICD-10-CM

## 2018-09-17 DIAGNOSIS — Z886 Allergy status to analgesic agent status: Secondary | ICD-10-CM | POA: Diagnosis not present

## 2018-09-17 DIAGNOSIS — R5383 Other fatigue: Secondary | ICD-10-CM | POA: Insufficient documentation

## 2018-09-17 DIAGNOSIS — E1140 Type 2 diabetes mellitus with diabetic neuropathy, unspecified: Secondary | ICD-10-CM

## 2018-09-17 DIAGNOSIS — Z809 Family history of malignant neoplasm, unspecified: Secondary | ICD-10-CM

## 2018-09-17 DIAGNOSIS — I120 Hypertensive chronic kidney disease with stage 5 chronic kidney disease or end stage renal disease: Secondary | ICD-10-CM

## 2018-09-17 DIAGNOSIS — C9110 Chronic lymphocytic leukemia of B-cell type not having achieved remission: Secondary | ICD-10-CM

## 2018-09-17 DIAGNOSIS — Z8673 Personal history of transient ischemic attack (TIA), and cerebral infarction without residual deficits: Secondary | ICD-10-CM

## 2018-09-17 DIAGNOSIS — L98429 Non-pressure chronic ulcer of back with unspecified severity: Secondary | ICD-10-CM

## 2018-09-17 DIAGNOSIS — Z87891 Personal history of nicotine dependence: Secondary | ICD-10-CM | POA: Diagnosis not present

## 2018-09-17 DIAGNOSIS — L8990 Pressure ulcer of unspecified site, unspecified stage: Secondary | ICD-10-CM | POA: Insufficient documentation

## 2018-09-17 DIAGNOSIS — Z992 Dependence on renal dialysis: Secondary | ICD-10-CM | POA: Insufficient documentation

## 2018-09-17 DIAGNOSIS — Z888 Allergy status to other drugs, medicaments and biological substances status: Secondary | ICD-10-CM

## 2018-09-17 DIAGNOSIS — N186 End stage renal disease: Secondary | ICD-10-CM | POA: Diagnosis not present

## 2018-09-17 DIAGNOSIS — E1122 Type 2 diabetes mellitus with diabetic chronic kidney disease: Secondary | ICD-10-CM | POA: Insufficient documentation

## 2018-09-17 DIAGNOSIS — C8303 Small cell B-cell lymphoma, intra-abdominal lymph nodes: Secondary | ICD-10-CM

## 2018-09-17 LAB — CBC WITH DIFFERENTIAL/PLATELET
Abs Immature Granulocytes: 0 10*3/uL (ref 0.00–0.07)
Basophils Absolute: 0 10*3/uL (ref 0.0–0.1)
Basophils Relative: 0 %
Eosinophils Absolute: 0 10*3/uL (ref 0.0–0.5)
Eosinophils Relative: 1 %
HCT: 38.9 % — ABNORMAL LOW (ref 39.0–52.0)
Hemoglobin: 12.8 g/dL — ABNORMAL LOW (ref 13.0–17.0)
Lymphocytes Relative: 73 %
Lymphs Abs: 3.1 10*3/uL (ref 0.7–4.0)
MCH: 33.9 pg (ref 26.0–34.0)
MCHC: 32.9 g/dL (ref 30.0–36.0)
MCV: 102.9 fL — ABNORMAL HIGH (ref 80.0–100.0)
Monocytes Absolute: 0 10*3/uL — ABNORMAL LOW (ref 0.1–1.0)
Monocytes Relative: 0 %
Neutro Abs: 0.9 10*3/uL — ABNORMAL LOW (ref 1.7–7.7)
Neutrophils Relative %: 22 %
Other: 4 %
Platelets: 98 10*3/uL — ABNORMAL LOW (ref 150–400)
RBC: 3.78 MIL/uL — ABNORMAL LOW (ref 4.22–5.81)
RDW: 15.3 % (ref 11.5–15.5)
Smear Review: NORMAL
WBC Morphology: ABNORMAL
WBC: 4.3 10*3/uL (ref 4.0–10.5)
nRBC: 0 % (ref 0.0–0.2)

## 2018-09-17 LAB — PATHOLOGIST SMEAR REVIEW

## 2018-09-17 LAB — LACTATE DEHYDROGENASE: LDH: 139 U/L (ref 98–192)

## 2018-09-17 MED ORDER — SODIUM CHLORIDE 0.9% FLUSH
10.0000 mL | INTRAVENOUS | Status: DC | PRN
  Administered 2018-09-17: 10 mL via INTRAVENOUS
  Filled 2018-09-17: qty 10

## 2018-09-17 NOTE — Assessment & Plan Note (Addendum)
#   SLL/CLL- stage IV-currently off ibrutinib [secondary to intolerance fatigue easy bruising]/ on surveillance.  Stable.   #Continue surveillance.  Would not recommend therapies given his poor tolerance.  #Chronic mild thrombocytopenia platelets 98.  Stable.  #Anemia hemoglobin 12 stable.  Continue iron/Procrit with nephrology.  # Headaches-inetrmittent- Dr.Shah-  frontal; defer to nephrology/PCP.  # End-stage renal disease on hemodialysis; tolerating well.  Stable  #IV access port flush every 2 months.  # pressure ulcers-recommend wound center referral.  # Lump in the back-appears to be a prominent lower vertebral body.  Monitor for now.  # DISPOSITION: # referral to wound care- sacral ulcer # port flush in 2 months # follow up in 4 months-MD labs- cbc/LDH/port flush- Dr.B  Cc; Dr.Kolluru/ [facility] Busing.

## 2018-09-17 NOTE — Progress Notes (Signed)
[place] OFFICE PROGRESS NOTE  Patient Care Team: Albina Billet, MD as PCP - General (Internal Medicine)  Cancer Staging No matching staging information was found for the patient.   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 Bone marrow biopsy on 03/05/13 with mildly hypercellular marrow for age 73-50% with interstitial predominantly small B-lymphocytic infiltrate estimated about 20-30% of marrow cells, mild nonspecific dyserythropoiesis, storage iron present this. Flow cytometry showed 29% CD5+ clonal B-cell population which is CD45+, CD5+, CD19+, CD20+, CD22+, CD23+, CD38+, HLA-DR+, Slg kappa+, and is CD10-, CD11b-, FMC7-. 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 PET scan on 03/03/13 with extensive hypermetabolic lymphadenopathy.  (presented with progressive Thrombocytopenia with persistent Anemia. CBC on 01/16/13 showed hemoglobin 10.0, MCV 98, platelets 80, WBC 4890 with 40% neutrophils, 54% lymphocytes, 1.4% monocytes. On Epogen and Venofer at dialysis treatments. Further workup showed small M-spike of 0.2).   Got Treanda/Rituxan x2 cycles in Dec 2014/Jan 2015. Then on single agent Rituxan q 8 weeks (got #3 dose on 09/16/13). --------------------------------------------------------------- ----------------------  # DEC 2014- SMALL CELL LYMPHOMA/CLL- STAGE IV [BMBx- 03/2013- 20-30% B cell infiltrate; CD 5+/CD 20+]; FISH- Trisomy 12;DEC 2014-Treanda-Rituxan x2 [held sec to prolonged hospital/pneumoani/HD]; Rituxa q8W [last June 2015; Held sec to Superficial Bladder ca]; CT- JUNE 2017- STABLE Mediastinal LN/supraclav LN/pelvic LN; CT DEC 2017- mild progression noted ~1.5-2cm mediastinal/Ax LN. Cont surveillance  # SEP 2018- PROGRESSION on PET scan-  # SEP 27th 2018- Ibrutinib 280 mg/day [after HD]; stopped Jan 2019- sec to severe anemia 6.7/easy bruising; Hb 9-10 off ibrutinib; II  opinion at [facility]/Dr.Coombs [225m/day x3 times a week]; DISCONTINUED in SEP 2019 [sec to intol]  # 769mRLL lung nodule- F/u in 6 M [Hx of smoking quit 2015]  # ESRD on HD [Dr.Voora/Dr.Kolluru]; DM; Non-healing ulcers in feet/Hx R foot drop- walker ambulation  #Noninvasive Bladder cancer/recent UTIs-currently resolved. ------------------------------------------------------------   DIAGNOSIS: [ 2014] SLL/CLL  STAGE: IV  ;GOALS: Control  CURRENT/MOST RECENT THERAPY: Surveillance.     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  male pleasant patient above history of CLL/SLL currently on surveillance is here for follow-up.  Patient continues to be off ibrutinib because of poor tolerance/easy bruising.  He denies any worsening lumps in his neck.  Appetite is good with no weight loss.  No night sweats.  Complains of "knot" in the midline lower back.  No pain.  However he notes to have healing sacral ulcers-complains of pain.  Patient continues per dialysis-Procrit IV iron thru nephrology.  His hemoglobin has been fairly stable on 12.  Not needing a blood transfusion.  Continues to complain of chronic headaches.  Had a recent MRI that was negative for any acute process.  Followed by neurology.   Review of Systems  Constitutional: Positive for malaise/fatigue. Negative for chills, diaphoresis, fever and weight loss.  HENT: Negative for nosebleeds and sore throat.   Eyes: Negative for double vision.  Respiratory: Negative for cough, hemoptysis, sputum production, shortness of breath and wheezing.   Cardiovascular: Negative for chest pain, palpitations, orthopnea and leg swelling.  Gastrointestinal: Negative for abdominal pain, blood in stool, constipation, diarrhea, heartburn, melena, nausea and vomiting.  Genitourinary: Negative for dysuria, frequency and urgency.  Musculoskeletal: Positive for back pain and joint  pain.  Skin: Negative.  Negative for itching and rash.  Neurological: Negative for dizziness, tingling,  focal weakness, weakness and headaches.  Endo/Heme/Allergies: Does not bruise/bleed easily.  Psychiatric/Behavioral: Negative for depression. The patient does not have insomnia.       PAST MEDICAL HISTORY :  Past Medical History:  Diagnosis Date  . Anemia   . Arthritis   . Bladder cancer ([place])   . Chronic kidney disease   . Diabetes mellitus without complication ([place])   . Dyspnea    DOE  . ESRD on hemodialysis [facility])    Started dialysis in 2014, gets HD as of 2019 in [place] w/ Dr Rolly Salter on MWF schedule.    [name] GERD (gastroesophageal reflux disease)   . HOH (hard of hearing)   . Hypertension   . IBS (irritable bowel syndrome)   . Lymphoma ([place]) 09/27/2014  . Neuropathy    RIGHT LEG  . Stroke [facility])    TIA    PAST SURGICAL HISTORY :   Past Surgical History:  Procedure Laterality Date  . BACK SURGERY  2007  . CATARACT EXTRACTION W/PHACO Left 03/23/2016   Procedure: CATARACT EXTRACTION PHACO AND INTRAOCULAR LENS PLACEMENT (IOC);  Surgeon: Leandrew Koyanagi, MD;  Location: [facility] ORS;  Service: Ophthalmology;  Laterality: Left;  [place] 52.6 AP% 14.7 CDE 7.72 Fluid pack lot # [id number]  . CATARACT EXTRACTION W/PHACO Right 05/30/2016   Procedure: CATARACT EXTRACTION PHACO AND INTRAOCULAR LENS PLACEMENT ([place]);  Surgeon: Leandrew Koyanagi, MD;  Location: [facility] ORS;  Service: Ophthalmology;  Laterality: Right;  [place] 01:08 AP% 13.9 CDE 9.53  note: could not get IV in patient, so procedure done without anesthesia personell present, ok per Dr Wallace Going fluid pack lo t# [id number]  . CIRCUMCISION    . IVC FILTER INSERTION N/A 12/31/2017   Procedure: IVC FILTER INSERTION;  Surgeon: Algernon Huxley, MD;  Location: [place] CV LAB;  Service: Cardiovascular;  Laterality: N/A;  . PERIPHERAL VASCULAR CATHETERIZATION Left 08/19/2015   Procedure: A/V Shuntogram/Fistulagram;  Surgeon: Algernon Huxley, MD;  Location: [place] CV LAB;  Service: Cardiovascular;  Laterality: Left;  . PERIPHERAL VASCULAR CATHETERIZATION N/A 08/19/2015   Procedure: A/V Shunt Intervention;  Surgeon: Algernon Huxley, MD;  Location: [place] CV LAB;  Service: Cardiovascular;  Laterality: N/A;  . PORTA CATH INSERTION N/A 01/02/2018   Procedure: PORTA CATH INSERTION;  Surgeon: Katha Cabal, MD;  Location: [place] CV LAB;  Service: Cardiovascular;  Laterality: N/A;    FAMILY HISTORY :   Family History  Problem Relation Age of Onset  . Cancer Mother   . Kidney cancer Neg Hx   . Prostate cancer Neg Hx   . Kidney failure Neg Hx   . Bladder Cancer Neg Hx     SOCIAL HISTORY:   Social History   Tobacco Use  . Smoking status: Former Smoker    Types: Cigarettes    Quit date: 05/19/2013    Years since quitting: 5.3  . Smokeless tobacco: Never Used  . Tobacco comment: quit  Substance Use Topics  . Alcohol use: No    Alcohol/week: 0.0 standard drinks  . Drug use: No    ALLERGIES:  is allergic to acyclovir and related; heparin; ibuprofen; multivitamin [centrum]; daypro [oxaprozin]; and tape.  MEDICATIONS:  Current Outpatient Medications  Medication Sig Dispense Refill  . acyclovir (ZOVIRAX) 400 MG tablet     . albuterol (PROVENTIL) (2.5 MG/3ML) 0.083% nebulizer solution Take 2.5 mg by nebulization every 6 (six) hours as needed for wheezing or shortness of breath.    . B Complex-C-Folic Acid (RENAL  VITAMIN PO) Take by mouth. Renal tab Calcium 2 BID    . butalbital-acetaminophen-caffeine (FIORICET) 50-325-40 MG tablet Take by mouth.    . cetirizine (ZYRTEC) 10 MG tablet Take 10 mg by mouth as needed for allergies.    . Difluprednate (DUREZOL) 0.05 % EMUL Place 1 drop into the left eye 2 (two) times daily.     [name] ENSURE PLUS (ENSURE PLUS) LIQD Take 237 mLs by mouth.    . fluticasone (FLONASE) 50 MCG/ACT nasal spray Place 1 spray into both nostrils daily as needed for allergies.     [name]  gabapentin (NEURONTIN) 100 MG capsule Take 1 capsule (100 mg total) by mouth 3 (three) times daily. (Patient taking differently: Take 100 mg by mouth at bedtime. ) 90 capsule 0  . glimepiride (AMARYL) 2 MG tablet Take 2 mg by mouth. 1/2 tab Mon, Wed, Fri    . linagliptin (TRADJENTA) 5 MG TABS tablet Take 5 mg by mouth daily.    [name] lovastatin (MEVACOR) 20 MG tablet Take 1 tablet by mouth daily.    . Melatonin 5 MG TABS Take by mouth at bedtime.    . metoCLOPramide (REGLAN) 5 MG tablet Take 5 mg by mouth 3 (three) times daily before meals. Patient takes 1 tab with each meal.    . midodrine (PROAMATINE) 10 MG tablet Take 1 tablet (10 mg total) by mouth every dialysis (MWF).    [name] multivitamin (RENA-VIT) TABS tablet Take 1 tablet by mouth at bedtime.     [name] nystatin cream (MYCOSTATIN) Apply 1 application topically 2 (two) times daily as needed (irritation).     . polyethylene glycol (MIRALAX / GLYCOLAX) packet Take 17 g by mouth daily. (Patient taking differently: Take 17 g by mouth daily as needed. ) 14 each 0  . Polyvinyl Alcohol-Povidone PF (REFRESH) 1.4-0.6 % SOLN Apply 1 drop to eye 3 (three) times daily as needed (Dry eyes).    . silver sulfADIAZINE (SILVADENE) 1 % cream APPLY 1 APPLICATION TOPICALY DAILY (Patient taking differently: Apply 1 application topically daily as needed (sores). ) 50 g 1  . sodium chloride (OCEAN) 0.65 % SOLN nasal spray Place 1 spray into both nostrils as needed for congestion.  0  . docusate sodium (COLACE) 100 MG capsule Take 100 mg by mouth daily as needed for mild constipation.    [name] guaiFENesin (MUCINEX) 600 MG 12 hr tablet Take 1 tablet (600 mg total) by mouth 2 (two) times daily. (Patient not taking: Reported on 09/17/2018)    . guaiFENesin-dextromethorphan (ROBITUSSIN DM) 100-10 MG/5ML syrup Take 15 mLs by mouth every 4 (four) hours as needed for cough. (Patient not taking: Reported on 09/17/2018) 118 mL 0  . insulin aspart (NOVOLOG) 100 UNIT/ML injection Inject 4 Units  into the skin 3 (three) times daily with meals. (Patient not taking: Reported on 09/17/2018) 10 mL 11  . insulin detemir (LEVEMIR) 100 UNIT/ML injection Inject 0.14 mLs (14 Units total) into the skin daily. (Patient not taking: Reported on 09/17/2018) 10 mL 11   No current facility-administered medications for this visit.    Facility-Administered Medications Ordered in Other Visits  Medication Dose Route Frequency Provider Last Rate Last Dose  . sodium chloride flush (NS) 0.9 % injection 10 mL  10 mL Intravenous PRN Charlaine Dalton R, MD   10 mL at 09/17/18 1038    PHYSICAL EXAMINATION: ECOG PERFORMANCE STATUS: 1 - Symptomatic but completely ambulatory  BP 121/70   Pulse 74   Temp (!) 96.4 F (  35.8 C)   Resp 18   Wt 194 lb (88 kg)   BMI 27.06 kg/m   Filed Weights   09/17/18 1105  Weight: 194 lb (88 kg)    Physical Exam  Constitutional: He is oriented to person, place, and time and well-developed, well-nourished, and in no distress.  In a wheel chair; he is alone.  HENT:  Head: Normocephalic and atraumatic.  Mouth/Throat: Oropharynx is clear and moist. No oropharyngeal exudate.  Eyes: Pupils are equal, round, and reactive to light.  Neck: Normal range of motion. Neck supple.  Bilateral approximately 1 cm lymph nodes felt in the neck left more than right.  Cardiovascular: Normal rate and regular rhythm.  Pulmonary/Chest: No respiratory distress. He has no wheezes.  Abdominal: Soft. Bowel sounds are normal. He exhibits no distension and no mass. There is no abdominal tenderness. There is no rebound and no guarding.  Musculoskeletal: Normal range of motion.        General: No tenderness or edema.     Comments: Approximately 2 to 3 cm knot midline lower back/bony prominence.  Neurological: He is alert and oriented to person, place, and time.  Skin: Skin is warm.  Sacral ulcer scabbing.  Psychiatric: Affect normal.  ;    LABORATORY DATA:  I have reviewed the data as  listed    Component Value Date/Time   NA 136 03/22/2018 1932   NA 140 11/18/2013 1030   K 2.9 (L) 03/22/2018 1932   K 4.0 11/18/2013 1030   CL 98 03/22/2018 1932   CL 97 (L) 11/18/2013 1030   CO2 23 03/22/2018 1932   CO2 33 (H) 11/18/2013 1030   GLUCOSE 234 (H) 03/22/2018 1932   GLUCOSE 265 (H) 11/18/2013 1030   BUN 21 03/22/2018 1932   BUN 30 (H) 11/18/2013 1030   CREATININE 4.04 (H) 03/22/2018 1932   CREATININE 5.05 (H) 11/18/2013 1030   CALCIUM 8.5 (L) 03/22/2018 1932   CALCIUM 8.6 11/18/2013 1030   PROT 5.2 (L) 01/18/2018 0327   PROT 7.0 11/18/2013 1030   ALBUMIN 2.6 (L) 01/18/2018 0327   ALBUMIN 3.7 11/18/2013 1030   AST 13 (L) 01/18/2018 0327   AST 15 11/18/2013 1030   ALT 15 01/18/2018 0327   ALT 25 11/18/2013 1030   ALKPHOS 50 01/18/2018 0327   ALKPHOS 105 11/18/2013 1030   BILITOT 0.9 01/18/2018 0327   BILITOT 0.3 11/18/2013 1030   GFRNONAA 14 (L) 03/22/2018 1932   GFRNONAA 11 (L) 11/18/2013 1030   GFRAA 16 (L) 03/22/2018 1932   GFRAA 13 (L) 11/18/2013 1030    No results found for: SPEP, UPEP  Lab Results  Component Value Date   WBC 4.3 09/17/2018   NEUTROABS 0.9 (L) 09/17/2018   HGB 12.8 (L) 09/17/2018   HCT 38.9 (L) 09/17/2018   MCV 102.9 (H) 09/17/2018   PLT 98 (L) 09/17/2018      Chemistry      Component Value Date/Time   NA 136 03/22/2018 1932   NA 140 11/18/2013 1030   K 2.9 (L) 03/22/2018 1932   K 4.0 11/18/2013 1030   CL 98 03/22/2018 1932   CL 97 (L) 11/18/2013 1030   CO2 23 03/22/2018 1932   CO2 33 (H) 11/18/2013 1030   BUN 21 03/22/2018 1932   BUN 30 (H) 11/18/2013 1030   CREATININE 4.04 (H) 03/22/2018 1932   CREATININE 5.05 (H) 11/18/2013 1030      Component Value Date/Time   CALCIUM 8.5 (L) 03/22/2018 1932  CALCIUM 8.6 11/18/2013 1030   ALKPHOS 50 01/18/2018 0327   ALKPHOS 105 11/18/2013 1030   AST 13 (L) 01/18/2018 0327   AST 15 11/18/2013 1030   ALT 15 01/18/2018 0327   ALT 25 11/18/2013 1030   BILITOT 0.9 01/18/2018  0327   BILITOT 0.3 11/18/2013 1030       RADIOGRAPHIC STUDIES: I have personally reviewed the radiological images as listed and agreed with the findings in the report. No results found.   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  Stable.   #Continue surveillance.  Would not recommend therapies given his poor tolerance.  #Chronic mild thrombocytopenia platelets 98.  Stable.  #Anemia hemoglobin 12 stable.  Continue iron/Procrit with nephrology.  # Headaches-inetrmittent- Dr.Shah-  frontal; defer to nephrology/PCP.  # End-stage renal disease on hemodialysis; tolerating well.  Stable  #IV access port flush every 2 months.  # pressure ulcers-recommend wound center referral.  # Lump in the back-appears to be a prominent lower vertebral body.  Monitor for now.  # DISPOSITION: # referral to wound care- sacral ulcer # port flush in 2 months # follow up in 4 months-MD labs- cbc/LDH/port flush- Dr.B  Cc; Dr.Kolluru/ [facility].    Orders Placed This Encounter  Procedures  . CBC with Differential    Standing Status:   Future    Standing Expiration Date:   09/17/2019  . Comprehensive metabolic panel    Standing Status:   Future    Standing Expiration Date:   09/17/2019  . Lactate dehydrogenase    Standing Status:   Future    Standing Expiration Date:   09/17/2019  . AMB referral to wound care center    Referral Priority:   Routine    Referral Type:   Consultation    Referred to Provider:   Ricard Dillon, MD    Number of Visits Requested:   1   All questions were answered. The patient knows to call the clinic with any problems, questions or concerns.      Cammie Sickle, MD 09/17/2018 12:22 PM

## 2018-09-17 NOTE — Progress Notes (Signed)
Patient concerned of a new knot on lower back that is not painful and doesn't know it's there until her actually touches the knot.  Had a recent head MRI ordered by neuro for chronic headaches.  Also has a "knot" on top of buttocks that has now scabbed over and is painful with sitting.

## 2018-09-24 ENCOUNTER — Encounter: Payer: Medicare HMO | Attending: Physician Assistant | Admitting: Physician Assistant

## 2018-09-24 ENCOUNTER — Other Ambulatory Visit: Payer: Self-pay

## 2018-09-24 DIAGNOSIS — C9111 Chronic lymphocytic leukemia of B-cell type in remission: Secondary | ICD-10-CM | POA: Diagnosis not present

## 2018-09-24 DIAGNOSIS — M069 Rheumatoid arthritis, unspecified: Secondary | ICD-10-CM | POA: Diagnosis not present

## 2018-09-24 DIAGNOSIS — L89153 Pressure ulcer of sacral region, stage 3: Secondary | ICD-10-CM | POA: Insufficient documentation

## 2018-09-24 DIAGNOSIS — E11622 Type 2 diabetes mellitus with other skin ulcer: Secondary | ICD-10-CM | POA: Insufficient documentation

## 2018-09-24 DIAGNOSIS — E1122 Type 2 diabetes mellitus with diabetic chronic kidney disease: Secondary | ICD-10-CM | POA: Insufficient documentation

## 2018-09-24 DIAGNOSIS — Z8249 Family history of ischemic heart disease and other diseases of the circulatory system: Secondary | ICD-10-CM | POA: Insufficient documentation

## 2018-09-24 DIAGNOSIS — Z87891 Personal history of nicotine dependence: Secondary | ICD-10-CM | POA: Diagnosis not present

## 2018-09-24 DIAGNOSIS — N186 End stage renal disease: Secondary | ICD-10-CM | POA: Insufficient documentation

## 2018-09-24 DIAGNOSIS — Z992 Dependence on renal dialysis: Secondary | ICD-10-CM | POA: Insufficient documentation

## 2018-09-24 DIAGNOSIS — I120 Hypertensive chronic kidney disease with stage 5 chronic kidney disease or end stage renal disease: Secondary | ICD-10-CM | POA: Diagnosis not present

## 2018-09-25 NOTE — Progress Notes (Signed)
AVIER, JECH ([id number]) Visit Report for 09/24/2018 Abuse/Suicide Risk Screen Details Patient Name: Mark [name], Mark [name] Date of Service: 09/24/2018 1:15 PM Medical Record Number: [id number] Patient Account Number: [id number] Date of Birth/Sex: 06/17/1945 (73 y.o. M) Treating RN: Army Melia Primary Care Brannon Decaire: Benita Stabile Other Clinician: Referring Meggen Spaziani: Charlaine Dalton Treating Aric Jost/Extender: STONE III, HOYT Weeks in Treatment: 0 Abuse/Suicide Risk Screen Items Answer ABUSE RISK SCREEN: Has anyone close to you tried to hurt or harm you recentlyo No Do you feel uncomfortable with anyone in your familyo No Has anyone forced you do things that you didnot want to doo No Electronic Signature(s) Signed: 09/24/2018 3:33:36 PM By: Army Melia Entered By: Army Melia on 09/24/2018 13:30:00 Mark [name] ([id number]) -------------------------------------------------------------------------------- Activities of Daily Living Details Patient Name: Mark [name] Date of Service: 09/24/2018 1:15 PM Medical Record Number: [id number] Patient Account Number: [id number] Date of Birth/Sex: [birth year] (73 y.o. M) Treating RN: Army Melia Primary Care Nurah Petrides: Benita Stabile Other Clinician: Referring Tanisia Yokley: Charlaine Dalton Treating Valetta Mulroy/Extender: STONE III, HOYT Weeks in Treatment: 0 Activities of Daily Living Items Answer Activities of Daily Living (Please select one for each item) Drive Automobile Not Able Take Medications Completely Able Use Telephone Completely Able Care for Appearance Completely Able Use Toilet Completely Able Bath / Shower Need Assistance Dress Self Completely Able Feed Self Completely Able Walk Need Assistance Get In / Out Bed Need Assistance Housework Need Assistance Prepare Meals Need Assistance Handle Money Completely Able Shop for Self Need Assistance Electronic Signature(s) Signed: 09/24/2018 3:33:36 PM By: Army Melia Entered By: Army Melia on 09/24/2018 13:30:25 Mark, [name] ([id number]) -------------------------------------------------------------------------------- Education Screening Details Patient Name: Mark [name] Date of Service: 09/24/2018 1:15 PM Medical Record Number: [id number] Patient Account Number: [id number] Date of Birth/Sex: 03-14-1946 (73 y.o. M) Treating RN: Army Melia Primary Care Demani Weyrauch: Benita Stabile Other Clinician: Referring Iver Fehrenbach: Charlaine Dalton Treating Merril Isakson/Extender: Melburn Hake, HOYT Weeks in Treatment: 0 Primary Learner Assessed: Patient Learning Preferences/Education Level/Primary Language Learning Preference: Explanation, Demonstration Highest Education Level: High School Preferred Language: English Cognitive Barrier Language Barrier: No Translator Needed: No Memory Deficit: No Emotional Barrier: No Cultural/Religious Beliefs Affecting Medical Care: No Physical Barrier Impaired Vision: No Impaired Hearing: No Decreased Hand dexterity: No Knowledge/Comprehension Knowledge Level: High Comprehension Level: High Ability to understand written High instructions: Ability to understand verbal High instructions: Motivation Anxiety Level: Calm Cooperation: Cooperative Education Importance: Acknowledges Need Interest in Health Problems: Asks Questions Perception: Coherent Willingness to Engage in Self- High Management Activities: Readiness to Engage in Self- High Management Activities: Electronic Signature(s) Signed: 09/24/2018 3:33:36 PM By: Army Melia Entered By: Army Melia on 09/24/2018 13:30:49 Mark, [name] ([id number]) -------------------------------------------------------------------------------- Fall Risk Assessment Details Patient Name: Mark [name] Date of Service: 09/24/2018 1:15 PM Medical Record Number: [id number] Patient Account Number: [id number] Date of Birth/Sex: 08-12-1945 (73 y.o.  M) Treating RN: Army Melia Primary Care Amedee Cerrone: Benita Stabile Other Clinician: Referring Vidal Lampkins: Charlaine Dalton Treating Younique Casad/Extender: STONE III, HOYT Weeks in Treatment: 0 Fall Risk Assessment Items Have you had 2 or more falls in the last 12 monthso 0 No Have you had any fall that resulted in injury in the last 12 monthso 0 No FALLS RISK SCREEN History of falling - immediate or within 3 months 0 No Secondary diagnosis (Do you have 2 or more medical diagnoseso) 0 No Ambulatory aid None/bed rest/wheelchair/nurse 0 No Crutches/cane/walker 15 Yes Furniture 0 No Intravenous therapy Access/Saline/Heparin Lock 0 No Gait/Transferring Normal/ bed rest/ wheelchair 0 No Weak (  short steps with or without shuffle, stooped but able to lift head while 0 No walking, may seek support from furniture) Impaired (short steps with shuffle, may have difficulty arising from chair, head 0 No down, impaired balance) Mental Status Oriented to own ability 0 Yes Electronic Signature(s) Signed: 09/24/2018 3:33:36 PM By: Army Melia Entered By: Army Melia on 09/24/2018 13:31:09 HARVEL, MESKILL ([id number]) -------------------------------------------------------------------------------- Foot Assessment Details Patient Name: Mark [name] Date of Service: 09/24/2018 1:15 PM Medical Record Number: [id number] Patient Account Number: [id number] Date of Birth/Sex: 01/20/1946 (73 y.o. M) Treating RN: Army Melia Primary Care Leanndra Pember: Benita Stabile Other Clinician: Referring Koltan Portocarrero: Charlaine Dalton Treating Dhamar Gregory/Extender: STONE III, HOYT Weeks in Treatment: 0 Foot Assessment Items Site Locations + = Sensation present, - = Sensation absent, C = Callus, U = Ulcer R = Redness, W = Warmth, M = Maceration, PU = Pre-ulcerative lesion F = Fissure, S = Swelling, D = Dryness Assessment Right: Left: Other Deformity: No No Prior Foot Ulcer: No No Prior Amputation: No No Charcot  Joint: No No Ambulatory Status: Ambulatory With Help Assistance Device: Walker Gait: Administrator, arts) Signed: 09/24/2018 3:33:36 PM By: Army Melia Entered By: Army Melia on 09/24/2018 13:32:01 LUDIE, PAVLIK ([id number]) -------------------------------------------------------------------------------- Nutrition Risk Screening Details Patient Name: Mark [name] Date of Service: 09/24/2018 1:15 PM Medical Record Number: [id number] Patient Account Number: [id number] Date of Birth/Sex: [birth year] (73 y.o. M) Treating RN: Army Melia Primary Care Kateri Balch: Benita Stabile Other Clinician: Referring Jakyia Gaccione: Charlaine Dalton Treating [place]scoe/Extender: STONE III, HOYT Weeks in Treatment: 0 Height (in): 71 Weight (lbs): 192 Body Mass Index (BMI): 26.8 Nutrition Risk Screening Items Score Screening NUTRITION RISK SCREEN: I have an illness or condition that made me change the kind and/or amount of 0 No food I eat I eat fewer than two meals per day 0 No I eat few fruits and vegetables, or milk products 0 No I have three or more drinks of beer, liquor or wine almost every day 0 No I have tooth or mouth problems that make it hard for me to eat 0 No I don't always have enough money to buy the food I need 0 No I eat alone most of the time 0 No I take three or more different prescribed or over-the-counter drugs a day 0 No Without wanting to, I have lost or gained 10 pounds in the last six months 0 No I am not always physically able to shop, cook and/or feed myself 0 No Nutrition Protocols Good Risk Protocol 0 No interventions needed Moderate Risk Protocol High Risk Proctocol Risk Level: Good Risk Score: 0 Electronic Signature(s) Signed: 09/24/2018 3:33:36 PM By: Army Melia Entered By: Army Melia on 09/24/2018 13:31:16

## 2018-09-27 NOTE — Progress Notes (Signed)
DIVANTE, KOTCH ([id number]) Visit Report for 09/24/2018 Chief Complaint Document Details Patient Name: Mark, [name] Date of Service: 09/24/2018 1:15 PM Medical Record Number: [id number] Patient Account Number: [id number] Date of Birth/Sex: Apr 11, 1945 (73 y.o. M) Treating RN: Montey Hora Primary Care Provider: Benita Stabile Other Clinician: Referring Provider: Charlaine Dalton Treating Provider/Extender: STONE III, HOYT Weeks in Treatment: 0 Information Obtained from: Patient Chief Complaint Pressure ulcer sacral region stage 3 Electronic Signature(s) Signed: 09/25/2018 10:45:49 PM By: Worthy Keeler PA-C Entered By: Worthy Keeler on 09/24/2018 14:01:33 TAY, WHITWELL ([id number]) -------------------------------------------------------------------------------- HPI Details Patient Name: Mark [name] Date of Service: 09/24/2018 1:15 PM Medical Record Number: [id number] Patient Account Number: [id number] Date of Birth/Sex: Sep 14, 1945 (73 y.o. M) Treating RN: Montey Hora Primary Care Provider: Benita Stabile Other Clinician: Referring Provider: Charlaine Dalton Treating Provider/Extender: STONE III, HOYT Weeks in Treatment: 0 History of Present Illness HPI Description: He returns today in followup. He began his chemotherapy recently and is scheduled to get additional cycles in January. He continues his dialysis on the day she does not get chemotherapy. Has 2 more weeks remaining on his intravenous antibiotics that he receives with dialysis. He's had no brace and problems with his wounds. No pain, no fever or chills, no significant drainage. The wound on the right lower extremity is closed. He also requested callous bearing on his left fifth metatarsal head. He has an appointment to see his podiatrist Dr. Scherrie November in approximately 2 weeks. Dr. Scherrie November has been caring his callous for many years and he would like to continue his followup with him. We strongly encouraged  that. Readmission: 09/24/18 on evaluation today patient presents for initial evaluation our clinic concerning issues that is been having with a sacral pressure ulcer. Fortunately based on what I'm actually seeing at this time the does not appear to be open wound at this time that he tells me that he does have discomfort especially when he sits on this for long periods or when he lies down in the bed on his back. He is very concerned about pain and states that this all developed when he became very weak and the sick knew that being in the hospital as well is in a nursing facility following. Nonetheless overall I feel like that he is showing signs of definite improvement based on what I'm seeing and I do not see any evidence that he is currently having a deeper abscess or anything that would account for the discomfort other than that he is getting pressure to the region. Electronic Signature(s) Signed: 09/25/2018 10:45:49 PM By: Worthy Keeler PA-C Entered By: Worthy Keeler on 09/25/2018 22:41:16 MIRON, MARXEN ([id number]) -------------------------------------------------------------------------------- Physical Exam Details Patient Name: Mark [name] Date of Service: 09/24/2018 1:15 PM Medical Record Number: [id number] Patient Account Number: [id number] Date of Birth/Sex: 10-21-1945 (73 y.o. M) Treating RN: Montey Hora Primary Care Provider: Benita Stabile Other Clinician: Referring Provider: Charlaine Dalton Treating Provider/Extender: STONE III, HOYT Weeks in Treatment: 0 Constitutional patient is hypertensive.. pulse regular and within target range for patient.[name] respirations regular, non-labored and within target range for patient.[name] temperature within target range for patient.. Well-nourished and well-hydrated in no acute distress. Eyes conjunctiva clear no eyelid edema noted. pupils equal round and reactive to light and accommodation. Ears, Nose, Mouth, and Throat no gross  abnormality of ear auricles or external auditory canals. normal hearing noted during conversation. mucus membranes moist. Respiratory normal breathing without difficulty. clear to auscultation bilaterally. Cardiovascular regular rate and  rhythm with normal S1, S2. no clubbing, cyanosis, significant edema, <3 sec cap refill. Gastrointestinal (GI) soft, non-tender, non-distended, +BS. no ventral hernia noted. Musculoskeletal Patient unable to walk without assistance. no significant deformity or arthritic changes, no loss or range of motion, no clubbing. Psychiatric this patient is able to make decisions and demonstrates good insight into disease process. Alert and Oriented x 3. pleasant and cooperative. Notes Patient's wound bed currently actually appears to be completely healed there's some evidence of scar tissue in the sacral region obviously he did have a pressure ulcer on the unsure of exactly how deep this went. Nonetheless it was at least a stage II possibly three with some of the scarring that I see. Nonetheless there's nothing open right now that I can identify. Electronic Signature(s) Signed: 09/25/2018 10:45:49 PM By: Worthy Keeler PA-C Entered By: Worthy Keeler on 09/25/2018 22:41:50 AARSH, [name] ([id number]) -------------------------------------------------------------------------------- Physician Orders Details Patient Name: Mark [name] Date of Service: 09/24/2018 1:15 PM Medical Record Number: [id number] Patient Account Number: [id number] Date of Birth/Sex: June 30, 1945 (73 y.o. 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 M) Treating RN: Montey Hora Primary Care Provider: Benita Stabile Other Clinician: Referring Provider: Charlaine Dalton Treating Provider/Extender: STONE III, HOYT Weeks in Treatment: 0 Subjective Chief  Complaint Information obtained from Patient Pressure ulcer sacral region stage 3 History of Present Illness (HPI) He returns today in followup. He began his chemotherapy recently and is scheduled to get additional cycles in January. He continues his dialysis on the day she does not get chemotherapy. Has 2 more weeks remaining on his intravenous antibiotics that he receives with dialysis. He's had no brace and problems with his wounds. No pain, no fever or chills, no significant drainage. The wound on the right lower extremity is closed. He also requested callous bearing on his left fifth metatarsal head. He has an appointment to see his podiatrist Dr. Scherrie November in approximately 2 weeks.  Dr. Scherrie November has been caring his callous for many years and he would like to continue his followup with him. We strongly encouraged that. Readmission: 09/24/18 on evaluation today patient presents for initial evaluation our clinic concerning issues that is been having with a sacral pressure ulcer. Fortunately based on what I'm actually seeing at this time the does not appear to be open wound at this time that he tells me that he does have discomfort especially when he sits on this for long periods or when he lies down in the bed on his back. He is very concerned about pain and states that this all developed when he became very weak and the sick knew that being in the hospital as well is in a nursing facility following. Nonetheless overall I feel like that he is showing signs of definite improvement based on what I'm seeing and I do not see any evidence that he is currently having a deeper abscess or anything that would account for the discomfort other than that he is getting pressure to the region. Patient History Information obtained from Patient. Allergies Daypro (Severity: Moderate, Reaction: swelling and rash) Family History Cancer - Mother, Diabetes - Maternal Grandparents,Paternal  Grandparents,Mother,Father,Siblings,Child, Heart Disease - Child, Hypertension - Maternal Grandparents,Paternal Grandparents,Father,Siblings,Child, Seizures - Siblings, No family history of Kidney Disease, Lung Disease, Stroke, Thyroid Problems, Tuberculosis. Social History Former smoker, Marital Status - Married, Alcohol Use - Daily. 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 Ear/Nose/Mouth/Throat Denies complaints or symptoms of Difficult clearing ears, Sinusitis. Hematologic/Lymphatic Denies complaints or symptoms of Bleeding / Clotting Disorders, Human Immunodeficiency Virus. Respiratory Denies complaints or symptoms of Chronic or frequent coughs, Shortness of Breath. Cardiovascular Denies complaints or symptoms of Chest pain, LE edema. Gastrointestinal Denies complaints or symptoms of Frequent diarrhea, Nausea, Vomiting. Genitourinary Complains or has symptoms of Kidney failure/ Dialysis. Denies complaints or symptoms of Incontinence/dribbling. Immunological Denies complaints or symptoms of Hives, Itching. Integumentary (Skin) Denies complaints or symptoms of Wounds, Bleeding or bruising tendency,  Breakdown, Swelling. Musculoskeletal Denies complaints or symptoms of Muscle Pain, Muscle Weakness. Neurologic Denies complaints or symptoms of Numbness/parasthesias, Focal/Weakness. Psychiatric Denies complaints or symptoms of Anxiety, Claustrophobia. Objective Constitutional patient is hypertensive.. pulse regular and within target range for patient.[name] respirations regular, non-labored and within target range for patient.[name] temperature within target range for patient.. Well-nourished and well-hydrated in no acute distress. Vitals Time Taken: 1:21 PM, Height: 71 in, Source: Stated, Weight: 192 lbs, Source: Stated, BMI: 26.8, Temperature: 98.6 F, Pulse: 73 bpm, Respiratory Rate: 16 breaths/min, Blood Pressure: 144/61 mmHg. ARMANIE, MARTINE ([id number]) Eyes conjunctiva clear no eyelid edema noted. pupils equal round and reactive to light and accommodation. Ears, Nose, Mouth, and Throat no gross abnormality of ear auricles or external auditory canals. normal hearing noted during conversation. mucus membranes moist. Respiratory normal breathing without difficulty. clear to auscultation bilaterally. Cardiovascular regular rate and rhythm  with normal S1, S2. no clubbing, cyanosis, significant edema, Gastrointestinal (GI) soft, non-tender, non-distended, +BS. no ventral hernia noted. Musculoskeletal Patient unable to walk without assistance. no significant deformity or arthritic changes, no loss or range of motion, no clubbing. Psychiatric this patient is able to make decisions and demonstrates good insight into disease process. Alert and Oriented x 3. pleasant and cooperative. General Notes: Patient's wound bed currently actually appears to be completely healed there's some evidence of scar tissue in the sacral region obviously he did have a pressure ulcer on the unsure of exactly how deep this went. Nonetheless it was at least a stage II possibly three with some of the scarring that I  see. Nonetheless there's nothing open right now that I can identify. 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 He is very frustrated the with the fact that he states that is very cumbersome and gets old very quickly. Nonetheless I think that is gonna be the thing he has to focus on order to keep this close and fit there from being any issues. In the end I do believe he understood he wanted me to see if there was anything that I could "cut out". Nonetheless there really isn't anything like that to be done in this case again this is a shield although scar tissue region in the sacral area. We'll see him back as needed. Electronic Signature(s) Signed: 09/25/2018 10:45:49 PM By: Worthy Keeler PA-C Entered By: Worthy Keeler on 09/25/2018 22:42:03 AUTRY, PRUST ([id number]) -------------------------------------------------------------------------------- ROS/PFSH Details Patient Name: Mark [name] Date of Service: 09/24/2018 1:15 PM Medical Record Number: [id number] Patient Account Number: [id number] Date of Birth/Sex: [birth year] (73 y.o. M) Treating RN: Army Melia Primary Care Provider: Benita Stabile Other Clinician: Referring Provider: Charlaine Dalton Treating Provider/Extender: STONE III, HOYT Weeks in Treatment: 0 Information Obtained From Patient Eyes Complaints and Symptoms: Negative for: Dry Eyes; Vision Changes; Glasses / Contacts Medical History: Positive for: Cataracts - both removed Negative for: Glaucoma; Optic  Neuritis Past Medical History Notes: Retinal neuropathy Ear/Nose/Mouth/Throat Complaints and Symptoms: Negative for: Difficult clearing ears; Sinusitis Hematologic/Lymphatic Complaints and Symptoms: Negative for: Bleeding / Clotting Disorders; Human Immunodeficiency Virus Medical History: Past Medical History Notes: chronic lymphatic leukemia Respiratory Complaints and Symptoms: Negative for: Chronic or frequent coughs; Shortness of Breath Cardiovascular Complaints and Symptoms: Negative for: Chest pain; LE edema Medical History: Positive for: Hypertension Gastrointestinal Complaints and Symptoms: Negative for: Frequent diarrhea; Nausea; Vomiting Genitourinary Complaints and Symptoms: Positive for: Kidney failure/ Dialysis Negative for: Incontinence/dribbling VIRGIE, CHERY ([id number]) Medical History: Positive for: 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 Immunological Complaints and Symptoms: Negative for: Hives; Itching Integumentary (Skin) Complaints and Symptoms: Negative for: Wounds; Bleeding or bruising tendency; Breakdown; Swelling Musculoskeletal Complaints and Symptoms: Negative for: Muscle Pain; Muscle Weakness Medical History: Positive for: Rheumatoid Arthritis Neurologic Complaints and Symptoms: Negative for: Numbness/parasthesias; Focal/Weakness Psychiatric Complaints and Symptoms: Negative for: Anxiety; Claustrophobia Constitutional Symptoms (General Health) Medical History: Past Medical History Notes: Dialysis (Tues, Thurs, Sat), Stage 4 Kidney Disease, Diabetes and High Blood Pressure Endocrine  Medical History: Positive for: Type II Diabetes Time with diabetes: 40 years Treated with: Oral agents Blood sugar tested every day: Yes Tested : Blood sugar testing results: Breakfast: 140; Dinner: 160; Bedtime: 200 Oncologic Medical History: Positive for: Received Chemotherapy Negative for: Received Radiation HBO Extended History  Items Eyes: Cataracts TRENDEN, HAZELRIGG ([id number]) Immunizations Pneumococcal Vaccine: Received Pneumococcal Vaccination: Yes Implantable Devices No devices added Family and Social History Cancer: Yes - Mother; Diabetes: Yes - Maternal Grandparents,Paternal Grandparents,Mother,Father,Siblings,Child; Heart Disease: Yes - Child; Hypertension: Yes - Maternal Grandparents,Paternal Grandparents,Father,Siblings,Child; Kidney Disease: No; Lung Disease: No; Seizures: Yes - Siblings; Stroke: No; Thyroid Problems: No; Tuberculosis: No; Former smoker; Marital Status - Married; Alcohol Use: Daily; Financial Concerns: No; Food, Clothing or Shelter Needs: No; Support System Lacking: No; Transportation Concerns: No Electronic Signature(s) Signed: 09/24/2018 3:33:36 PM By: Army Melia Signed: 09/25/2018 10:45:49 PM By: Worthy Keeler PA-C Entered By: Army Melia on 09/24/2018 13:29:51 JERMANY, RIMEL ([id number]) -------------------------------------------------------------------------------- SuperBill Details Patient Name: Mark [name] Date of Service: 09/24/2018 Medical Record Number: [id number] Patient Account Number: [id number] Date of Birth/Sex: Jul 17, 1945 (73 y.o. 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

## 2018-09-27 NOTE — Progress Notes (Signed)
LARELL, BANEY ([id number]) Visit Report for 09/24/2018 Allergy List Details Patient Name: Mark [name], Mark [name] Date of Service: 09/24/2018 1:15 PM Medical Record Number: [id number] Patient Account Number: [id number] Date of Birth/Sex: 09-30-1945 (73 y.o. M) Treating RN: Army Melia Primary Care Shloime Keilman: Benita Stabile Other Clinician: Referring Lakendra Helling: Charlaine Dalton Treating Audie Stayer/Extender: STONE III, HOYT Weeks in Treatment: 0 Allergies Active Allergies Daypro Reaction: swelling and rash Severity: Moderate Active: 02/10/2013 Allergy Notes Electronic Signature(s) Signed: 09/24/2018 3:33:36 PM By: Army Melia Entered By: Army Melia on 09/24/2018 13:24:15 Mark [name], Mark [name] ([id number]) -------------------------------------------------------------------------------- Arrival Information Details Patient Name: Mark [name] Date of Service: 09/24/2018 1:15 PM Medical Record Number: [id number] Patient Account Number: [id number] Date of Birth/Sex: 01-26-1946 (73 y.o. M) Treating RN: Army Melia Primary Care Ivie Savitt: Benita Stabile Other Clinician: Referring Ellanor Feuerstein: Charlaine Dalton Treating Milania Haubner/Extender: Melburn Hake, HOYT Weeks in Treatment: 0 Visit Information Patient Arrived: Wheel Chair Arrival Time: 13:20 Accompanied By: self Transfer Assistance: None History Since Last Visit Added or deleted any medications: No Any new allergies or adverse reactions: No Had a fall or experienced change in activities of daily living that may affect risk of falls: No Signs or symptoms of abuse/neglect since last visito No Hospitalized since last visit: No Electronic Signature(s) Signed: 09/24/2018 3:33:36 PM By: Army Melia Entered By: Army Melia on 09/24/2018 13:20:45 Mark [name] ([id number]) -------------------------------------------------------------------------------- Clinic Level of Care Assessment Details Patient Name: Mark [name] Date of  Service: 09/24/2018 1:15 PM Medical Record Number: [id number] Patient Account Number: [id number] Date of Birth/Sex: [birth year] (73 y.o. M) Treating RN: Montey Hora Primary Care Myan Suit: Benita Stabile Other Clinician: Referring Jaiyon Wander: Charlaine Dalton Treating Estil Vallee/Extender: STONE III, HOYT Weeks in Treatment: 0 Clinic Level of Care Assessment Items TOOL 2 Quantity Score []  - Use when only an EandM is performed on the INITIAL visit 0 ASSESSMENTS - Nursing Assessment / Reassessment X - General Physical Exam (combine w/ comprehensive assessment (listed just below) when 1 20 performed on new pt. evals) X- 1 25 Comprehensive Assessment (HX, ROS, Risk Assessments, Wounds Hx, etc.) ASSESSMENTS - Wound and Skin Assessment / Reassessment []  - Simple Wound Assessment / Reassessment - one wound 0 []  - 0 Complex Wound Assessment / Reassessment - multiple wounds X- 1 10 Dermatologic / Skin Assessment (not related to wound area) ASSESSMENTS - Ostomy and/or Continence Assessment and Care []  - Incontinence Assessment and Management 0 []  - 0 Ostomy Care Assessment and Management (repouching, etc.) PROCESS - Coordination of Care X - Simple Patient / Family Education for ongoing care 1 15 []  - 0 Complex (extensive) Patient / Family Education for ongoing care X- 1 10 Staff obtains Programmer, systems, Records, Test Results / Process Orders []  - 0 Staff telephones HHA, Nursing Homes / Clarify orders / etc []  - 0 Routine Transfer to another Facility (non-emergent condition) []  - 0 Routine Hospital Admission (non-emergent condition) X- 1 15 New Admissions / Biomedical engineer / Ordering NPWT, Apligraf, etc. []  - 0 Emergency Hospital Admission (emergent condition) X- 1 10 Simple Discharge Coordination []  - 0 Complex (extensive) Discharge Coordination PROCESS - Special Needs []  - Pediatric / Minor Patient Management 0 []  - 0 Isolation Patient Management Mark [name], SWOR.  ([id number]) []  - 0 Hearing / Language / Visual special needs []  - 0 Assessment of Community assistance (transportation, D/C planning, etc.) []  - 0 Additional assistance / Altered mentation []  - 0 Support Surface(s) Assessment (bed, cushion, seat, etc.) INTERVENTIONS - Wound Cleansing / Measurement X - Wound Imaging (photographs -  any number of wounds) 1 5 []  - 0 Wound Tracing (instead of photographs) X- 1 5 Simple Wound Measurement - one wound []  - 0 Complex Wound Measurement - multiple wounds X- 1 5 Simple Wound Cleansing - one wound []  - 0 Complex Wound Cleansing - multiple wounds INTERVENTIONS - Wound Dressings []  - Small Wound Dressing one or multiple wounds 0 []  - 0 Medium Wound Dressing one or multiple wounds []  - 0 Large Wound Dressing one or multiple wounds []  - 0 Application of Medications - injection INTERVENTIONS - Miscellaneous []  - External ear exam 0 []  - 0 Specimen Collection (cultures, biopsies, blood, body fluids, etc.) []  - 0 Specimen(s) / Culture(s) sent or taken to Lab for analysis []  - 0 Patient Transfer (multiple staff / Civil Service fast streamer / Similar devices) []  - 0 Simple Staple / Suture removal (25 or less) []  - 0 Complex Staple / Suture removal (26 or more) []  - 0 Hypo / Hyperglycemic Management (close monitor of Blood Glucose) []  - 0 Ankle / Brachial Index (ABI) - do not check if billed separately Has the patient been seen at the hospital within the last three years: Yes Total Score: 120 Level Of Care: New/Established - Level 4 Electronic Signature(s) Signed: 09/24/2018 4:25:12 PM By: Montey Hora Entered By: Montey Hora on 09/24/2018 14:20:37 Mark [name] ([id number]) -------------------------------------------------------------------------------- Encounter Discharge Information Details Patient Name: Mark [name] Date of Service: 09/24/2018 1:15 PM Medical Record Number: [id number] Patient Account Number: [id number] Date of  Birth/Sex: 29-Mar-1946 (73 y.o. M) Treating RN: Montey Hora Primary Care Taedyn Glasscock: Benita Stabile Other Clinician: Referring Mckinzey Entwistle: Charlaine Dalton Treating Vernell Townley/Extender: Melburn Hake, HOYT Weeks in Treatment: 0 Encounter Discharge Information Items Discharge Condition: Stable Ambulatory Status: Wheelchair Discharge Destination: Home Transportation: Private Auto Accompanied By: self Schedule Follow-up Appointment: No Clinical Summary of Care: Electronic Signature(s) Signed: 09/24/2018 3:32:02 PM By: Montey Hora Entered By: Montey Hora on 09/24/2018 15:32:02 Mark [name] ([id number]) -------------------------------------------------------------------------------- Lower Extremity Assessment Details Patient Name: Mark [name] Date of Service: 09/24/2018 1:15 PM Medical Record Number: [id number] Patient Account Number: [id number] Date of Birth/Sex: [birth year] (73 y.o. M) Treating RN: Army Melia Primary Care Troi Florendo: Benita Stabile Other Clinician: Referring Darian Cansler: Charlaine Dalton Treating Deyvi Bonanno/Extender: Melburn Hake, HOYT Weeks in Treatment: 0 Electronic Signature(s) Signed: 09/24/2018 3:33:36 PM By: Army Melia Entered By: Army Melia on 09/24/2018 13:37:04 Mark [name], Mark [name] ([id number]) -------------------------------------------------------------------------------- Multi Wound Chart Details Patient Name: Mark [name] Date of Service: 09/24/2018 1:15 PM Medical Record Number: [id number] Patient Account Number: [id number] Date of Birth/Sex: [birth year] (73 y.o. M) Treating RN: Montey Hora Primary Care Amariss Detamore: Benita Stabile Other Clinician: Referring Arantza Darrington: Charlaine Dalton Treating Betania Dizon/Extender: STONE III, HOYT Weeks in Treatment: 0 Vital Signs Height(in): 71 Pulse(bpm): 73 Weight(lbs): 192 Blood Pressure(mmHg): 144/61 Body Mass Index(BMI): 27 Temperature(F): 98.6 Respiratory Rate 16 (breaths/min): Wound  Assessments Treatment Notes Electronic Signature(s) Signed: 09/24/2018 4:25:12 PM By: Montey Hora Entered By: Montey Hora on 09/24/2018 14:15:25 Mark [name], Mark [name] ([id number]) -------------------------------------------------------------------------------- Multi-Disciplinary Care Plan Details Patient Name: Mark [name] Date of Service: 09/24/2018 1:15 PM Medical Record Number: [id number] Patient Account Number: [id number] Date of Birth/Sex: May 27, 1945 (73 y.o. M) Treating RN: Montey Hora Primary Care Arbie Blankley: Benita Stabile Other Clinician: Referring Analilia Geddis: Charlaine Dalton Treating Joshus Rogan/Extender: Melburn Hake, HOYT Weeks in Treatment: 0 Active Inactive Electronic Signature(s) Signed: 09/24/2018 4:25:12 PM By: Montey Hora Entered By: Montey Hora on 09/24/2018 14:15:08 Mark [name], Mark [name] ([id number]) -------------------------------------------------------------------------------- Pain Assessment Details Patient Name: Mark [name] Date of Service: 09/24/2018  1:15 PM Medical Record Number: [id number] Patient Account Number: [id number] Date of Birth/Sex: [birth year] (73 y.o. M) Treating RN: Army Melia Primary Care Ausha Sieh: Benita Stabile Other Clinician: Referring Kathaleen Dudziak: Charlaine Dalton Treating Traye Bates/Extender: STONE III, HOYT Weeks in Treatment: 0 Active Problems Location of Pain Severity and Description of Pain Patient Has Paino Yes Site Locations Pain Location: Generalized Pain, Pain in Ulcers Rate the pain. Current Pain Level: 8 Pain Management and Medication Current Pain Management: Electronic Signature(s) Signed: 09/24/2018 3:33:36 PM By: Army Melia Entered By: Army Melia on 09/24/2018 13:21:06 Mark [name], Mark [name] ([id number]) -------------------------------------------------------------------------------- Patient/Caregiver Education Details Patient Name: Mark [name] Date of Service: 09/24/2018 1:15 PM Medical Record  Number: [id number] Patient Account Number: [id number] Date of Birth/Gender: 1946-02-06 (73 y.o. M) Treating RN: Montey Hora Primary Care Physician: Benita Stabile Other Clinician: Referring Physician: Charlaine Dalton Treating Physician/Extender: Melburn Hake, HOYT Weeks in Treatment: 0 Education Assessment Education Provided To: Patient Education Topics Provided Pressure: Handouts: Preventing Pressure Ulcers, Other: pressure relief measures Methods: Explain/Verbal Responses: State content correctly Electronic Signature(s) Signed: 09/24/2018 4:25:12 PM By: Montey Hora Entered By: Montey Hora on 09/24/2018 15:31:45 Mark [name] ([id number]) -------------------------------------------------------------------------------- [place] Details Patient Name: Mark [name] Date of Service: 09/24/2018 1:15 PM Medical Record Number: [id number] Patient Account Number: [id number] Date of Birth/Sex: 1946/01/02 (73 y.o. M) Treating RN: Army Melia Primary Care Dallys Nowakowski: Benita Stabile Other Clinician: Referring Tayvon Culley: Charlaine Dalton Treating Yaseen Gilberg/Extender: STONE III, HOYT Weeks in Treatment: 0 Vital Signs Time Taken: 13:21 Temperature (F): 98.6 Height (in): 71 Pulse (bpm): 73 Source: Stated Respiratory Rate (breaths/min): 16 Weight (lbs): 192 Blood Pressure (mmHg): 144/61 Source: Stated Reference Range: 80 - 120 mg / dl Body Mass Index (BMI): 26.8 Electronic Signature(s) Signed: 09/24/2018 3:33:36 PM By: Army Melia Entered By: Army Melia on 09/24/2018 13:21:35

## 2018-10-25 ENCOUNTER — Emergency Department
Admission: EM | Admit: 2018-10-25 | Discharge: 2018-10-25 | Disposition: A | Discharge status: Home / Self Care | Payer: Medicare HMO | Attending: Emergency Medicine | Admitting: Emergency Medicine

## 2018-10-25 ENCOUNTER — Other Ambulatory Visit: Payer: Self-pay

## 2018-10-25 ENCOUNTER — Encounter: Payer: Self-pay | Admitting: Emergency Medicine

## 2018-10-25 DIAGNOSIS — U071 COVID-19: Secondary | ICD-10-CM

## 2018-10-25 DIAGNOSIS — R05 Cough: Secondary | ICD-10-CM | POA: Diagnosis present

## 2018-10-25 DIAGNOSIS — E1122 Type 2 diabetes mellitus with diabetic chronic kidney disease: Secondary | ICD-10-CM | POA: Diagnosis not present

## 2018-10-25 DIAGNOSIS — Z7984 Long term (current) use of oral hypoglycemic drugs: Secondary | ICD-10-CM | POA: Diagnosis not present

## 2018-10-25 DIAGNOSIS — N186 End stage renal disease: Secondary | ICD-10-CM | POA: Insufficient documentation

## 2018-10-25 DIAGNOSIS — Z87891 Personal history of nicotine dependence: Secondary | ICD-10-CM | POA: Diagnosis not present

## 2018-10-25 DIAGNOSIS — I120 Hypertensive chronic kidney disease with stage 5 chronic kidney disease or end stage renal disease: Secondary | ICD-10-CM | POA: Insufficient documentation

## 2018-10-25 DIAGNOSIS — Z992 Dependence on renal dialysis: Secondary | ICD-10-CM | POA: Diagnosis not present

## 2018-10-25 LAB — SARS CORONAVIRUS 2 BY RT PCR (HOSPITAL ORDER, PERFORMED IN ~~LOC~~ HOSPITAL LAB): SARS Coronavirus 2: POSITIVE — AB

## 2018-10-25 NOTE — ED Notes (Signed)
First Nurse Note: Pt to ED via [facility] stating that he needs a rapid COVID swab. Pt states that this son tested positive yesterday and he is a dialysis pt, pt needs test to determine if he can go to dialysis or not. Pts dialysis schedule is MWF, pt is supposed to go to dialysis today.

## 2018-10-25 NOTE — Discharge Instructions (Signed)
Patient has been rescheduled for dialysis.

## 2018-10-25 NOTE — ED Triage Notes (Signed)
Pt to ED from home c/o cough, nasal congestion, eyes watering for a couple days.  Lives with his son who tested positive for COVID yesterday.  Is a dialysis patient and they told him to have a rapid test before he can go back.  Pt chest rise even and unlabored, skin warm and dry, in NAD at this time.

## 2018-10-25 NOTE — ED Notes (Signed)
See triage note  States his son was admitted and was positive for COVID  States he is not able to be dialysized until tested

## 2018-10-25 NOTE — ED Provider Notes (Signed)
[facility] Emergency Department Provider Note   ____________________________________________   First MD Initiated Contact with Patient 10/25/18 [phone]     (approximate)  I have reviewed the triage vital signs and the nursing notes.   HISTORY  Chief Complaint Nasal Congestion    HPI Mark J [name]. is a 73 y.o. male patient presents for rapid cover testing  requested from dialysis clinic.  Patient went to his scheduled dialysis appointment and advised them his son tested positive for COVID-19 yesterday.  Patient states that he is having cough, nasal congestion and watery eyes for a couple days.  Patient denies fever/chills or sore throat.  Patient denies dyspnea.      Past Medical History:  Diagnosis Date  . Anemia   . Arthritis   . Bladder cancer ([place])   . Chronic kidney disease   . Diabetes mellitus without complication ([place])   . Dyspnea    DOE  . ESRD on hemodialysis [facility])    Started dialysis in 2014, gets HD as of 2019 in [place] w/ Dr Rolly Salter on MWF schedule.    [name] GERD (gastroesophageal reflux disease)   . HOH (hard of hearing)   . Hypertension   . IBS (irritable bowel syndrome)   . Lymphoma ([place]) 09/27/2014  . Neuropathy    RIGHT LEG  . Stroke [facility])    TIA    Patient Active Problem List   Diagnosis Date Noted  . S/P IVC filter 04/23/2018  . Malignant neoplasm of urinary bladder ([place]) 02/07/2018  . Respiratory failure with hypoxia ([place]) 01/17/2018  . Acute respiratory failure with hypoxia ([place]) 01/12/2018  . Acute respiratory failure ([place]) 01/12/2018  . Symptomatic anemia 12/31/2017  . Acute on chronic respiratory failure with hypoxemia ([place]) 12/31/2017  . Hypoxia 12/25/2017  . Pressure injury of skin 12/16/2017  . Fluid overload 01/15/2017  . UTI (urinary tract infection) 10/20/2016  . Sepsis ([place]) 10/20/2016  . 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 Renal cyst, right, on-complex  07/06/2015  . Ulcer of foot, chronic ([place]) 03/21/2015  . History of bladder cancer 11/10/2014  . Penile bleeding 10/12/2014  . Dependence on renal dialysis ([place]) 08/18/2013  . Arterial blood pressure decreased 08/18/2013  . Leukemia ([place]) 08/18/2013  . Retina disorder 08/18/2013  . Breath shortness 08/18/2013  . End-stage renal disease on hemodialysis ([place]) 12/23/2012  . Absolute anemia 07/18/2012  . HPTH (hyperparathyroidism) ([place]) 07/18/2012  . Acidosis, metabolic 03/47/4259  . Hyperparathyroidism ([place]) 07/18/2012  . Abnormal presence of protein in urine 01/01/2012  . HLD (hyperlipidemia) 12/25/2011  . Diabetic retinopathy associated with type 2 diabetes mellitus ([place]) 10/26/1999  . Essential (primary) hypertension 04/04/1999  . Diabetes mellitus type 2, uncontrolled ([place]) 04/04/1999  . Type 2 diabetes mellitus with other diabetic kidney complication ([place]) 56/38/7564    Past Surgical History:  Procedure Laterality Date  . BACK SURGERY  2007  . CATARACT EXTRACTION W/PHACO Left 03/23/2016   Procedure: CATARACT EXTRACTION PHACO AND INTRAOCULAR LENS PLACEMENT (IOC);  Surgeon: Leandrew Koyanagi, MD;  Location: [facility] ORS;  Service: Ophthalmology;  Laterality: Left;  [place] 52.6 AP% 14.7 CDE 7.72 Fluid pack lot # [id number]  . CATARACT EXTRACTION W/PHACO Right 05/30/2016   Procedure: CATARACT EXTRACTION PHACO AND INTRAOCULAR LENS PLACEMENT ([place]);  Surgeon: Leandrew Koyanagi, MD;  Location: [facility] ORS;  Service: Ophthalmology;  Laterality: Right;  [place] 01:08 AP% 13.9 CDE 9.53  note: could not get IV in patient, so procedure done without anesthesia personell  present, ok per Dr Wallace Going fluid pack lo t# [id number]  . CIRCUMCISION    . IVC FILTER INSERTION N/A 12/31/2017   Procedure: IVC FILTER INSERTION;  Surgeon: Algernon Huxley, MD;  Location: [place] CV LAB;  Service: Cardiovascular;  Laterality: N/A;  . PERIPHERAL VASCULAR CATHETERIZATION Left 08/19/2015   Procedure: A/V  Shuntogram/Fistulagram;  Surgeon: Algernon Huxley, MD;  Location: [place] CV LAB;  Service: Cardiovascular;  Laterality: Left;  . PERIPHERAL VASCULAR CATHETERIZATION N/A 08/19/2015   Procedure: A/V Shunt Intervention;  Surgeon: Algernon Huxley, MD;  Location: [place] CV LAB;  Service: Cardiovascular;  Laterality: N/A;  . PORTA CATH INSERTION N/A 01/02/2018   Procedure: PORTA CATH INSERTION;  Surgeon: Katha Cabal, MD;  Location: [place] CV LAB;  Service: Cardiovascular;  Laterality: N/A;    Prior to Admission medications   Medication Sig Start Date End Date Taking? Authorizing Provider  acyclovir (ZOVIRAX) 400 MG tablet  09/03/18   [provider]  albuterol (PROVENTIL) (2.5 MG/3ML) 0.083% nebulizer solution Take 2.5 mg by nebulization every 6 (six) hours as needed for wheezing or shortness of breath.    [provider]  B Complex-C-Folic Acid (RENAL VITAMIN PO) Take by mouth. Renal tab Calcium 2 BID    [provider]  butalbital-acetaminophen-caffeine (FIORICET) 50-325-40 MG tablet Take by mouth. 08/21/18 11/19/18  [provider]  cetirizine (ZYRTEC) 10 MG tablet Take 10 mg by mouth as needed for allergies.    [provider]  Difluprednate (DUREZOL) 0.05 % EMUL Place 1 drop into the left eye 2 (two) times daily.     [provider]  docusate sodium (COLACE) 100 MG capsule Take 100 mg by mouth daily as needed for mild constipation.    [provider]  ENSURE PLUS (ENSURE PLUS) LIQD Take 237 mLs by mouth.    [provider]  fluticasone (FLONASE) 50 MCG/ACT nasal spray Place 1 spray into both nostrils daily as needed for allergies.  12/14/14   [provider]  gabapentin (NEURONTIN) 100 MG capsule Take 1 capsule (100 mg total) by mouth 3 (three) times daily. Patient taking differently: Take 100 mg by mouth at bedtime.  03/02/16   Edrick Kins, DPM  glimepiride (AMARYL) 2 MG tablet Take 2 mg by mouth. 1/2  tab Mon, Wed, Fri 10/06/14   [provider]  linagliptin (TRADJENTA) 5 MG TABS tablet Take 5 mg by mouth daily.    [provider]  lovastatin (MEVACOR) 20 MG tablet Take 1 tablet by mouth daily. 12/25/16   [provider]  Melatonin 5 MG TABS Take by mouth at bedtime.    [provider]  metoCLOPramide (REGLAN) 5 MG tablet Take 5 mg by mouth 3 (three) times daily before meals. Patient takes 1 tab with each meal.    [provider]  midodrine (PROAMATINE) 10 MG tablet Take 1 tablet (10 mg total) by mouth every dialysis (MWF). 01/08/18   Dustin Flock, MD  multivitamin (RENA-VIT) TABS tablet Take 1 tablet by mouth at bedtime.     [provider]  nystatin cream (MYCOSTATIN) Apply 1 application topically 2 (two) times daily as needed (irritation).     [provider]  polyethylene glycol (MIRALAX / GLYCOLAX) packet Take 17 g by mouth daily. Patient taking differently: Take 17 g by mouth daily as needed.  01/16/18   Lavina Hamman, MD  Polyvinyl Alcohol-Povidone PF (REFRESH) 1.4-0.6 % SOLN Apply 1 drop to eye 3 (  three) times daily as needed (Dry eyes).    [provider]  silver sulfADIAZINE (SILVADENE) 1 % cream APPLY 1 APPLICATION TOPICALY DAILY Patient taking differently: Apply 1 application topically daily as needed (sores).  04/12/17   Edrick Kins, DPM  sodium chloride (OCEAN) 0.65 % SOLN nasal spray Place 1 spray into both nostrils as needed for congestion. 01/08/18   Dustin Flock, MD    Allergies Acyclovir and related, Heparin, Ibuprofen, Multivitamin [centrum], Daypro [oxaprozin], and Tape  Family History  Problem Relation Age of Onset  . Cancer Mother   . Kidney cancer Neg Hx   . Prostate cancer Neg Hx   . Kidney failure Neg Hx   . Bladder Cancer Neg Hx     Social History Social History   Tobacco Use  . Smoking status: Former Smoker    Types: Cigarettes    Quit date: 05/19/2013    Years since quitting:  5.4  . Smokeless tobacco: Never Used  . Tobacco comment: quit  Substance Use Topics  . Alcohol use: No    Alcohol/week: 0.0 standard drinks  . Drug use: No    Review of Systems Constitutional: No fever/chills Eyes: No visual changes.  Watery eyes. ENT: No sore throat.  Nasal congestion. Cardiovascular: Denies chest pain. Respiratory: Denies shortness of breath.  Nonproductive cough. Gastrointestinal: No abdominal pain.  No nausea, no vomiting.  No diarrhea.  No constipation. Genitourinary: Negative for dysuria. Musculoskeletal: Negative for back pain. Skin: Negative for rash. Neurological: Negative for headaches, focal weakness or numbness. Endocrine:  Diabetes, end-stage renal disease, and hypertension. Allergic/Immunilogical: See medication list. ____________________________________________   PHYSICAL EXAM:  VITAL SIGNS: ED Triage Vitals  Enc Vitals Group     BP 10/25/18 0905 (!) 157/55     Pulse Rate 10/25/18 0905 92     Resp 10/25/18 0905 18     Temp 10/25/18 0905 98.5 F (36.9 C)     Temp Source 10/25/18 0905 Oral     SpO2 10/25/18 0905 95 %     Weight 10/25/18 0921 192 lb (87.1 kg)     Height 10/25/18 0921 5\' 11"  (1.803 m)     Head Circumference --      Peak Flow --      Pain Score 10/25/18 0921 0     Pain Loc --      Pain Edu? --      Excl. in [place]? --    Constitutional: Alert and oriented. Well appearing and in no acute distress. Eyes: Conjunctivae are erythematous  PERRL. EOMI. Head: Atraumatic. Nose: No congestion/rhinnorhea.  Clear rhinorrhea Mouth/Throat: Mucous membranes are moist.  Oropharynx non-erythematous. Neck: No stridor.   Hematological/Lymphatic/Immunilogical: No cervical lymphadenopathy. Cardiovascular: Normal rate, regular rhythm. Grossly normal heart sounds.  Good peripheral circulation. Respiratory: Normal respiratory effort.  No retractions. Lungs CTAB. Gastrointestinal: Soft and nontender. No distention. No abdominal bruits. No CVA  tenderness. Musculoskeletal: No lower extremity tenderness nor edema.  No joint effusions. Neurologic:  Normal speech and language. No gross focal neurologic deficits are appreciated. No gait instability. Skin:  Skin is warm, dry and intact. No rash noted. Psychiatric: Mood and affect are normal. Speech and behavior are normal.  ____________________________________________   LABS (all labs ordered are listed, but only abnormal results are displayed)  Labs Reviewed  SARS CORONAVIRUS 2 ([place] [facility]) - Abnormal; Notable for the following components:      Result Value   SARS Coronavirus 2 POSITIVE (*)  All other components within normal limits   ____________________________________________  EKG   ____________________________________________  RADIOLOGY  ED MD interpretation:    Official radiology report(s): No results found.  ____________________________________________   PROCEDURES  Procedure(s) performed (including Critical Care):  Procedures   ____________________________________________   INITIAL IMPRESSION / ASSESSMENT AND PLAN / ED COURSE  As part of my medical decision making, I reviewed the following data within the [facility]. was evaluated in Emergency Department on 10/25/2018 for the symptoms described in the history of present illness. He was evaluated in the context of the global COVID-19 pandemic, which necessitated consideration that the patient might be at risk for infection with the SARS-CoV-2 virus that causes COVID-19. Institutional protocols and algorithms that pertain to the evaluation of patients at risk for COVID-19 are in a state of rapid change based on information released by regulatory bodies including the CDC and federal and state organizations. These policies and algorithms were followed during the patient's care in the ED.   Patient presents with cotesting  secondary to past exposure by family member.  Patient was pending dialysis but the clinic sent him to the emergency room to get tested before the procedure.  Patient test was positive for COVID and has been rescheduled for dialysis tomorrow.         ____________________________________________   FINAL CLINICAL IMPRESSION(S) / ED DIAGNOSES  Final diagnoses:  [id number] virus detected     ED Discharge Orders    None       Note:  This document was prepared using Dragon voice recognition software and may include unintentional dictation errors.    Sable Feil, PA-C 10/25/18 1222    Vanessa [place], MD 10/25/18 [phone]

## 2018-11-06 ENCOUNTER — Emergency Department (HOSPITAL_COMMUNITY): Payer: Medicare HMO

## 2018-11-06 ENCOUNTER — Encounter (HOSPITAL_COMMUNITY): Payer: Self-pay

## 2018-11-06 ENCOUNTER — Inpatient Hospital Stay (HOSPITAL_COMMUNITY)
Admission: EM | Admit: 2018-11-06 | Discharge: 2018-11-10 | DRG: 177 | Disposition: A | Discharge status: Home / Self Care | Payer: Medicare HMO | Attending: Internal Medicine | Admitting: Internal Medicine

## 2018-11-06 ENCOUNTER — Other Ambulatory Visit: Payer: Self-pay

## 2018-11-06 DIAGNOSIS — D631 Anemia in chronic kidney disease: Secondary | ICD-10-CM | POA: Diagnosis present

## 2018-11-06 DIAGNOSIS — R0902 Hypoxemia: Secondary | ICD-10-CM | POA: Diagnosis present

## 2018-11-06 DIAGNOSIS — E785 Hyperlipidemia, unspecified: Secondary | ICD-10-CM | POA: Diagnosis present

## 2018-11-06 DIAGNOSIS — Z888 Allergy status to other drugs, medicaments and biological substances status: Secondary | ICD-10-CM

## 2018-11-06 DIAGNOSIS — I10 Essential (primary) hypertension: Secondary | ICD-10-CM | POA: Diagnosis present

## 2018-11-06 DIAGNOSIS — R0602 Shortness of breath: Secondary | ICD-10-CM | POA: Diagnosis not present

## 2018-11-06 DIAGNOSIS — IMO0002 Reserved for concepts with insufficient information to code with codable children: Secondary | ICD-10-CM | POA: Diagnosis present

## 2018-11-06 DIAGNOSIS — Z8551 Personal history of malignant neoplasm of bladder: Secondary | ICD-10-CM

## 2018-11-06 DIAGNOSIS — H9190 Unspecified hearing loss, unspecified ear: Secondary | ICD-10-CM | POA: Diagnosis present

## 2018-11-06 DIAGNOSIS — Z961 Presence of intraocular lens: Secondary | ICD-10-CM | POA: Diagnosis present

## 2018-11-06 DIAGNOSIS — C9110 Chronic lymphocytic leukemia of B-cell type not having achieved remission: Secondary | ICD-10-CM | POA: Diagnosis present

## 2018-11-06 DIAGNOSIS — E1142 Type 2 diabetes mellitus with diabetic polyneuropathy: Secondary | ICD-10-CM | POA: Diagnosis present

## 2018-11-06 DIAGNOSIS — E8889 Other specified metabolic disorders: Secondary | ICD-10-CM | POA: Diagnosis present

## 2018-11-06 DIAGNOSIS — U071 COVID-19: Principal | ICD-10-CM | POA: Diagnosis present

## 2018-11-06 DIAGNOSIS — Z992 Dependence on renal dialysis: Secondary | ICD-10-CM

## 2018-11-06 DIAGNOSIS — Z7984 Long term (current) use of oral hypoglycemic drugs: Secondary | ICD-10-CM

## 2018-11-06 DIAGNOSIS — I679 Cerebrovascular disease, unspecified: Secondary | ICD-10-CM | POA: Diagnosis present

## 2018-11-06 DIAGNOSIS — I120 Hypertensive chronic kidney disease with stage 5 chronic kidney disease or end stage renal disease: Secondary | ICD-10-CM | POA: Diagnosis present

## 2018-11-06 DIAGNOSIS — C8303 Small cell B-cell lymphoma, intra-abdominal lymph nodes: Secondary | ICD-10-CM | POA: Diagnosis present

## 2018-11-06 DIAGNOSIS — K219 Gastro-esophageal reflux disease without esophagitis: Secondary | ICD-10-CM | POA: Diagnosis present

## 2018-11-06 DIAGNOSIS — Z9109 Other allergy status, other than to drugs and biological substances: Secondary | ICD-10-CM

## 2018-11-06 DIAGNOSIS — Z79899 Other long term (current) drug therapy: Secondary | ICD-10-CM

## 2018-11-06 DIAGNOSIS — Z886 Allergy status to analgesic agent status: Secondary | ICD-10-CM

## 2018-11-06 DIAGNOSIS — Z9841 Cataract extraction status, right eye: Secondary | ICD-10-CM

## 2018-11-06 DIAGNOSIS — N186 End stage renal disease: Secondary | ICD-10-CM

## 2018-11-06 DIAGNOSIS — D61818 Other pancytopenia: Secondary | ICD-10-CM

## 2018-11-06 DIAGNOSIS — Z87891 Personal history of nicotine dependence: Secondary | ICD-10-CM

## 2018-11-06 DIAGNOSIS — K580 Irritable bowel syndrome with diarrhea: Secondary | ICD-10-CM | POA: Diagnosis present

## 2018-11-06 DIAGNOSIS — E1122 Type 2 diabetes mellitus with diabetic chronic kidney disease: Secondary | ICD-10-CM | POA: Diagnosis present

## 2018-11-06 DIAGNOSIS — T380X5A Adverse effect of glucocorticoids and synthetic analogues, initial encounter: Secondary | ICD-10-CM | POA: Diagnosis present

## 2018-11-06 DIAGNOSIS — E1165 Type 2 diabetes mellitus with hyperglycemia: Secondary | ICD-10-CM | POA: Diagnosis present

## 2018-11-06 DIAGNOSIS — Z809 Family history of malignant neoplasm, unspecified: Secondary | ICD-10-CM

## 2018-11-06 DIAGNOSIS — M1990 Unspecified osteoarthritis, unspecified site: Secondary | ICD-10-CM | POA: Diagnosis present

## 2018-11-06 DIAGNOSIS — Z8673 Personal history of transient ischemic attack (TIA), and cerebral infarction without residual deficits: Secondary | ICD-10-CM

## 2018-11-06 DIAGNOSIS — J984 Other disorders of lung: Secondary | ICD-10-CM | POA: Diagnosis present

## 2018-11-06 DIAGNOSIS — Z9842 Cataract extraction status, left eye: Secondary | ICD-10-CM

## 2018-11-06 DIAGNOSIS — Z9221 Personal history of antineoplastic chemotherapy: Secondary | ICD-10-CM

## 2018-11-06 DIAGNOSIS — E875 Hyperkalemia: Secondary | ICD-10-CM | POA: Diagnosis present

## 2018-11-06 LAB — CBC WITH DIFFERENTIAL/PLATELET
Band Neutrophils: 0 %
Basophils Absolute: 0 10*3/uL (ref 0.0–0.1)
Basophils Relative: 0 %
Blasts: 0 %
Eosinophils Absolute: 0 10*3/uL (ref 0.0–0.5)
Eosinophils Relative: 1 %
HCT: 31.8 % — ABNORMAL LOW (ref 39.0–52.0)
Hemoglobin: 10.5 g/dL — ABNORMAL LOW (ref 13.0–17.0)
Lymphocytes Relative: 55 %
Lymphs Abs: 1.8 10*3/uL (ref 0.7–4.0)
MCH: 34.7 pg — ABNORMAL HIGH (ref 26.0–34.0)
MCHC: 33 g/dL (ref 30.0–36.0)
MCV: 105 fL — ABNORMAL HIGH (ref 80.0–100.0)
Metamyelocytes Relative: 0 %
Monocytes Absolute: 0.1 10*3/uL (ref 0.1–1.0)
Monocytes Relative: 3 %
Myelocytes: 0 %
Neutro Abs: 1.4 10*3/uL — ABNORMAL LOW (ref 1.7–7.7)
Neutrophils Relative %: 41 %
Other: 0 %
Platelets: 79 10*3/uL — ABNORMAL LOW (ref 150–400)
Promyelocytes Relative: 0 %
RBC: 3.03 MIL/uL — ABNORMAL LOW (ref 4.22–5.81)
RDW: 12.8 % (ref 11.5–15.5)
WBC: 3.3 10*3/uL — ABNORMAL LOW (ref 4.0–10.5)
nRBC: 0 % (ref 0.0–0.2)
nRBC: 0 /100 WBC

## 2018-11-06 LAB — COMPREHENSIVE METABOLIC PANEL WITH GFR
ALT: 27 U/L (ref 0–44)
AST: 28 U/L (ref 15–41)
Albumin: 3 g/dL — ABNORMAL LOW (ref 3.5–5.0)
Alkaline Phosphatase: 45 U/L (ref 38–126)
Anion gap: 17 — ABNORMAL HIGH (ref 5–15)
BUN: 49 mg/dL — ABNORMAL HIGH (ref 8–23)
CO2: 22 mmol/L (ref 22–32)
Calcium: 8.2 mg/dL — ABNORMAL LOW (ref 8.9–10.3)
Chloride: 93 mmol/L — ABNORMAL LOW (ref 98–111)
Creatinine, Ser: 8.7 mg/dL — ABNORMAL HIGH (ref 0.61–1.24)
GFR calc Af Amer: 6 mL/min — ABNORMAL LOW
GFR calc non Af Amer: 5 mL/min — ABNORMAL LOW
Glucose, Bld: 123 mg/dL — ABNORMAL HIGH (ref 70–99)
Potassium: 4.3 mmol/L (ref 3.5–5.1)
Sodium: 132 mmol/L — ABNORMAL LOW (ref 135–145)
Total Bilirubin: 1.1 mg/dL (ref 0.3–1.2)
Total Protein: 6.3 g/dL — ABNORMAL LOW (ref 6.5–8.1)

## 2018-11-06 MED ORDER — GUAIFENESIN-DM 100-10 MG/5ML PO SYRP: 10.0000 mL | ORAL_SOLUTION | ORAL | Status: DC | PRN

## 2018-11-06 MED ORDER — MELATONIN 3 MG PO TABS
6.0000 mg | ORAL_TABLET | Freq: Every day | ORAL | Status: DC
  Filled 2018-11-06 (×5): qty 2

## 2018-11-06 MED ORDER — RENA-VITE PO TABS
1.0000 | ORAL_TABLET | Freq: Every day | ORAL | Status: DC
  Administered 2018-11-07 – 2018-11-09 (×4): 1 via ORAL
  Filled 2018-11-06 (×4): qty 1

## 2018-11-06 MED ORDER — FLUTICASONE PROPIONATE 50 MCG/ACT NA SUSP: 1.0000 | Freq: Every day | NASAL | Status: DC | PRN

## 2018-11-06 MED ORDER — PRAVASTATIN SODIUM 10 MG PO TABS
20.0000 mg | ORAL_TABLET | Freq: Every day | ORAL | Status: DC
  Administered 2018-11-07 – 2018-11-09 (×3): 20 mg via ORAL
  Filled 2018-11-06 (×4): qty 2

## 2018-11-06 MED ORDER — ENSURE ENLIVE PO LIQD
237.0000 mL | Freq: Two times a day (BID) | ORAL | Status: DC
  Administered 2018-11-08 (×2): 237 mL via ORAL

## 2018-11-06 MED ORDER — ACETAMINOPHEN 325 MG PO TABS
650.0000 mg | ORAL_TABLET | Freq: Four times a day (QID) | ORAL | Status: DC | PRN
  Administered 2018-11-07 (×3): 650 mg via ORAL
  Filled 2018-11-06 (×3): qty 2

## 2018-11-06 MED ORDER — MIDODRINE HCL 5 MG PO TABS
10.0000 mg | ORAL_TABLET | ORAL | Status: DC
  Administered 2018-11-07 – 2018-11-09 (×2): 10 mg via ORAL
  Filled 2018-11-06 (×2): qty 2

## 2018-11-06 MED ORDER — ACETAMINOPHEN 325 MG PO TABS
650.0000 mg | ORAL_TABLET | Freq: Once | ORAL | Status: AC
  Administered 2018-11-06: 650 mg via ORAL
  Filled 2018-11-06: qty 2

## 2018-11-06 MED ORDER — VITAMIN C 500 MG PO TABS
500.0000 mg | ORAL_TABLET | Freq: Every day | ORAL | Status: DC
  Administered 2018-11-07 – 2018-11-10 (×4): 500 mg via ORAL
  Filled 2018-11-06 (×4): qty 1

## 2018-11-06 MED ORDER — LINAGLIPTIN 5 MG PO TABS
5.0000 mg | ORAL_TABLET | Freq: Every day | ORAL | Status: DC
  Administered 2018-11-07 – 2018-11-10 (×4): 5 mg via ORAL
  Filled 2018-11-06 (×4): qty 1

## 2018-11-06 MED ORDER — POLYVINYL ALCOHOL 1.4 % OP SOLN
1.0000 [drp] | Freq: Three times a day (TID) | OPHTHALMIC | Status: DC | PRN
  Filled 2018-11-06: qty 15

## 2018-11-06 MED ORDER — AMITRIPTYLINE HCL 10 MG PO TABS
10.0000 mg | ORAL_TABLET | Freq: Every evening | ORAL | Status: DC | PRN
  Filled 2018-11-06: qty 1

## 2018-11-06 MED ORDER — SODIUM CHLORIDE 0.9% FLUSH
10.0000 mL | INTRAVENOUS | Status: DC | PRN
  Administered 2018-11-07 – 2018-11-08 (×2): 10 mL
  Filled 2018-11-06 (×2): qty 40

## 2018-11-06 MED ORDER — LORATADINE 10 MG PO TABS
10.0000 mg | ORAL_TABLET | Freq: Every day | ORAL | Status: DC
  Administered 2018-11-07 – 2018-11-10 (×4): 10 mg via ORAL
  Filled 2018-11-06 (×4): qty 1

## 2018-11-06 MED ORDER — ONDANSETRON HCL 4 MG PO TABS: 4.0000 mg | ORAL_TABLET | Freq: Four times a day (QID) | ORAL | Status: DC | PRN

## 2018-11-06 MED ORDER — ALBUTEROL SULFATE HFA 108 (90 BASE) MCG/ACT IN AERS
2.0000 | INHALATION_SPRAY | Freq: Four times a day (QID) | RESPIRATORY_TRACT | Status: DC
  Filled 2018-11-06: qty 6.7

## 2018-11-06 MED ORDER — ALBUTEROL SULFATE HFA 108 (90 BASE) MCG/ACT IN AERS
1.0000 | INHALATION_SPRAY | Freq: Four times a day (QID) | RESPIRATORY_TRACT | Status: DC | PRN
  Filled 2018-11-06: qty 6.7

## 2018-11-06 MED ORDER — ONDANSETRON HCL 4 MG/2ML IJ SOLN: 4.0000 mg | Freq: Four times a day (QID) | INTRAMUSCULAR | Status: DC | PRN

## 2018-11-06 MED ORDER — GABAPENTIN 100 MG PO CAPS
100.0000 mg | ORAL_CAPSULE | Freq: Every day | ORAL | Status: DC
  Administered 2018-11-07 – 2018-11-09 (×4): 100 mg via ORAL
  Filled 2018-11-06 (×4): qty 1

## 2018-11-06 MED ORDER — SALINE SPRAY 0.65 % NA SOLN: 1.0000 | NASAL | Status: DC | PRN

## 2018-11-06 MED ORDER — HYDROCOD POLST-CPM POLST ER 10-8 MG/5ML PO SUER: 5.0000 mL | Freq: Two times a day (BID) | ORAL | Status: DC | PRN

## 2018-11-06 MED ORDER — ZINC SULFATE 220 (50 ZN) MG PO CAPS
220.0000 mg | ORAL_CAPSULE | Freq: Every day | ORAL | Status: DC
  Administered 2018-11-07 – 2018-11-10 (×4): 220 mg via ORAL
  Filled 2018-11-06 (×4): qty 1

## 2018-11-06 NOTE — ED Triage Notes (Signed)
Patient here with increased SOB and fever x 2 days. Patient states that he feels weak with same. Wears oxygen 2l at home and placed on same in ED. Alert and oriented. Only wants labs drawn when port accessed

## 2018-11-06 NOTE — ED Provider Notes (Signed)
[facility] EMERGENCY DEPARTMENT Provider Note   CSN: [id number] Arrival date & time: 11/06/18  1356    History   Chief Complaint Chief Complaint  Patient presents with   fever/ SOB    HPI Mark [name]. is a 73 y.o. male with history of ESRD on hemodialysis, diabetes, hypertension, and CLL with recent COVID-19 diagnosis on 7/24 presents to the ED complaining of worsening fever, cough, and shortness of breath over the past 2-3 days.  Patient reports he was asymptomatic at time of diagnosis but over the past week has developed a cough and began having fevers at home 2 days ago with progressively worsening shortness of breath over the past 2 to 3 days.  He reports he is normally on 2 L nasal cannula at night only but states he has been wearing it at all times for the past day.  He reports worsening of his shortness of breath with exertion and states he has difficulty getting from his bed to the bathroom which is approximately 15 feet away due to his shortness of breath and generalized weakness.  He reports he has had no appetite over the past 2 to 3 days and has had poor p.o. intake.  He also states he has had several episodes of diarrhea over the past 2 days.  Patient reports he was dialyzed yesterday and has not missed any sessions recently.     The history is provided by the patient.    Past Medical History:  Diagnosis Date   Anemia    Arthritis    Bladder cancer ([place])    Chronic kidney disease    Diabetes mellitus without complication ([place])    Dyspnea    DOE   ESRD on hemodialysis ([place])    Started dialysis in 2014, gets HD as of 2019 in [place] w/ Dr Rolly Salter on MWF schedule.     GERD (gastroesophageal reflux disease)    HOH (hard of hearing)    Hypertension    IBS (irritable bowel syndrome)    Lymphoma ([place]) 09/27/2014   Neuropathy    RIGHT LEG   Stroke [facility])    TIA    Patient Active Problem List   Diagnosis Date Noted    COVID-19 virus infection 11/06/2018   Pancytopenia ([place]) 11/06/2018   S/P IVC filter 04/23/2018   Malignant neoplasm of urinary bladder ([place]) 02/07/2018   Respiratory failure with hypoxia ([place]) 01/17/2018   Acute respiratory failure with hypoxia ([place]) 01/12/2018   Acute respiratory failure ([place]) 01/12/2018   Symptomatic anemia 12/31/2017   Acute on chronic respiratory failure with hypoxemia ([place]) 12/31/2017   Hypoxia 12/25/2017   Pressure injury of skin 12/16/2017   Fluid overload 01/15/2017   UTI (urinary tract infection) 10/20/2016   Sepsis ([place]) 10/20/2016   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   Renal cyst, right, on-complex 07/06/2015   Ulcer of foot, chronic ([place]) 03/21/2015   History of bladder cancer 11/10/2014   Penile bleeding 10/12/2014   Dependence on renal dialysis ([place]) 08/18/2013   Arterial blood pressure decreased 08/18/2013   Leukemia ([place]) 08/18/2013   Retina disorder 08/18/2013   Breath shortness 08/18/2013   End-stage renal disease on hemodialysis ([place]) 12/23/2012   Absolute anemia 07/18/2012   HPTH (hyperparathyroidism) ([place]) 07/18/2012   Acidosis, metabolic 44/06/4740   Hyperparathyroidism ([place]) 07/18/2012   Abnormal presence of protein in urine 01/01/2012   HLD (hyperlipidemia) 12/25/2011   Diabetic retinopathy associated with type 2 diabetes mellitus (  [place]) 10/26/1999   Essential (primary) hypertension 04/04/1999   Diabetes mellitus type 2, uncontrolled ([place]) 04/04/1999   Type 2 diabetes mellitus with other diabetic kidney complication ([place]) 56/43/3295    Past Surgical History:  Procedure Laterality Date   BACK SURGERY  2007   CATARACT EXTRACTION W/PHACO Left 03/23/2016   Procedure: CATARACT EXTRACTION PHACO AND INTRAOCULAR LENS PLACEMENT ([place]);  Surgeon: Leandrew Koyanagi, MD;  Location: [facility] ORS;  Service: Ophthalmology;  Laterality: Left;  [place] 52.6 AP% 14.7 CDE 7.72 Fluid pack  lot # [id number]   CATARACT EXTRACTION W/PHACO Right 05/30/2016   Procedure: CATARACT EXTRACTION PHACO AND INTRAOCULAR LENS PLACEMENT ([place]);  Surgeon: Leandrew Koyanagi, MD;  Location: [facility] ORS;  Service: Ophthalmology;  Laterality: Right;  [place] 01:08 AP% 13.9 CDE 9.53  note: could not get IV in patient, so procedure done without anesthesia personell present, ok per Dr Wallace Going fluid pack lo t# [id number]   CIRCUMCISION     IVC FILTER INSERTION N/A 12/31/2017   Procedure: IVC FILTER INSERTION;  Surgeon: Algernon Huxley, MD;  Location: [facility] CV LAB;  Service: Cardiovascular;  Laterality: N/A;   PERIPHERAL VASCULAR CATHETERIZATION Left 08/19/2015   Procedure: A/V Shuntogram/Fistulagram;  Surgeon: Algernon Huxley, MD;  Location: [facility] CV LAB;  Service: Cardiovascular;  Laterality: Left;   PERIPHERAL VASCULAR CATHETERIZATION N/A 08/19/2015   Procedure: A/V Shunt Intervention;  Surgeon: Algernon Huxley, MD;  Location: [facility] CV LAB;  Service: Cardiovascular;  Laterality: N/A;   PORTA CATH INSERTION N/A 01/02/2018   Procedure: PORTA CATH INSERTION;  Surgeon: Katha Cabal, MD;  Location: [facility] CV LAB;  Service: Cardiovascular;  Laterality: N/A;        Home Medications    Prior to Admission medications   Medication Sig Start Date End Date Taking? Authorizing Provider  amitriptyline (ELAVIL) 10 MG tablet Take 10 mg by mouth at bedtime as needed for sleep. 10/03/18  Yes [provider]  acyclovir (ZOVIRAX) 400 MG tablet  09/03/18   [provider]  albuterol (PROVENTIL) (2.5 MG/3ML) 0.083% nebulizer solution Take 2.5 mg by nebulization every 6 (six) hours as needed for wheezing or shortness of breath.    [provider]  B Complex-C-Folic Acid (RENAL VITAMIN PO) Take by mouth. Renal tab Calcium 2 BID    [provider]  butalbital-acetaminophen-caffeine (FIORICET) 50-325-40 MG tablet Take 1 tablet by mouth every 4 (four) hours as needed for  headache or migraine.  08/21/18 11/19/18  [provider]  cetirizine (ZYRTEC) 10 MG tablet Take 10 mg by mouth as needed for allergies.    [provider]  Difluprednate (DUREZOL) 0.05 % EMUL Place 1 drop into the left eye 2 (two) times daily.     [provider]  docusate sodium (COLACE) 100 MG capsule Take 100 mg by mouth daily as needed for mild constipation.    [provider]  ENSURE PLUS (ENSURE PLUS) LIQD Take 237 mLs by mouth.    [provider]  fluticasone (FLONASE) 50 MCG/ACT nasal spray Place 1 spray into both nostrils daily as needed for allergies.  12/14/14   [provider]  gabapentin (NEURONTIN) 100 MG capsule Take 1 capsule (100 mg total) by mouth 3 (three) times daily. Patient taking differently: Take 100 mg by mouth at bedtime.  03/02/16   Edrick Kins, DPM  glimepiride (AMARYL) 2 MG tablet Take 2 mg by mouth. 1/2 tab Mon, Wed, Fri 10/06/14   [provider]  linagliptin (TRADJENTA)  5 MG TABS tablet Take 5 mg by mouth daily.    [provider]  lovastatin (MEVACOR) 20 MG tablet Take 1 tablet by mouth daily. 12/25/16   [provider]  Melatonin 5 MG TABS Take by mouth at bedtime.    [provider]  metoCLOPramide (REGLAN) 5 MG tablet Take 5 mg by mouth 3 (three) times daily before meals. Patient takes 1 tab with each meal.    [provider]  midodrine (PROAMATINE) 10 MG tablet Take 1 tablet (10 mg total) by mouth every dialysis (MWF). 01/08/18   Dustin Flock, MD  multivitamin (RENA-VIT) TABS tablet Take 1 tablet by mouth at bedtime.     [provider]  nystatin cream (MYCOSTATIN) Apply 1 application topically 2 (two) times daily as needed (irritation).     [provider]  polyethylene glycol (MIRALAX / GLYCOLAX) packet Take 17 g by mouth daily. Patient taking differently: Take 17 g by mouth daily as needed.  01/16/18   Lavina Hamman, MD  Polyvinyl  Alcohol-Povidone PF (REFRESH) 1.4-0.6 % SOLN Apply 1 drop to eye 3 (three) times daily as needed (Dry eyes).    [provider]  silver sulfADIAZINE (SILVADENE) 1 % cream APPLY 1 APPLICATION TOPICALY DAILY Patient taking differently: Apply 1 application topically daily as needed (sores).  04/12/17   Edrick Kins, DPM  sodium chloride (OCEAN) 0.65 % SOLN nasal spray Place 1 spray into both nostrils as needed for congestion. 01/08/18   Dustin Flock, MD    Family History Family History  Problem Relation Age of Onset   Cancer Mother    Kidney cancer Neg Hx    Prostate cancer Neg Hx    Kidney failure Neg Hx    Bladder Cancer Neg Hx     Social History Social History   Tobacco Use   Smoking status: Former Smoker    Types: Cigarettes    Quit date: 05/19/2013    Years since quitting: 5.4   Smokeless tobacco: Never Used   Tobacco comment: quit  Substance Use Topics   Alcohol use: No    Alcohol/week: 0.0 standard drinks   Drug use: No     Allergies   Acyclovir and related, Heparin, Ibuprofen, Multivitamin [centrum], Daypro [oxaprozin], and Tape   Review of Systems Review of Systems  Constitutional: Positive for appetite change, chills and fever.  HENT: Negative for ear pain and sore throat.   Eyes: Negative for pain and visual disturbance.  Respiratory: Positive for cough and shortness of breath.   Cardiovascular: Negative for chest pain and palpitations.  Gastrointestinal: Positive for diarrhea. Negative for abdominal pain.  Genitourinary: Negative for dysuria and hematuria.  Musculoskeletal: Negative for arthralgias and back pain.  Skin: Negative for color change and rash.  Neurological: Positive for weakness. Negative for seizures and syncope.  Psychiatric/Behavioral: Negative for agitation and behavioral problems.  All other systems reviewed and are negative.    Physical Exam Updated Vital Signs BP (!) 120/46    Pulse 75    Temp (!) 102.6 F (39.2  C) (Oral)    Resp (!) 22    SpO2 99%   Physical Exam Vitals signs and nursing note reviewed.  Constitutional:      General: He is not in acute distress.    Appearance: Normal appearance. He is normal weight. He is not ill-appearing, toxic-appearing or diaphoretic.  HENT:     Head: Normocephalic and atraumatic.     Nose: Nose normal. No congestion  or rhinorrhea.     Mouth/Throat:     Mouth: Mucous membranes are moist.     Pharynx: Oropharynx is clear. No oropharyngeal exudate or posterior oropharyngeal erythema.  Eyes:     Extraocular Movements: Extraocular movements intact.     Pupils: Pupils are equal, round, and reactive to light.  Neck:     Musculoskeletal: Normal range of motion and neck supple. No neck rigidity or muscular tenderness.  Cardiovascular:     Rate and Rhythm: Normal rate and regular rhythm.     Pulses: Normal pulses.     Heart sounds: Normal heart sounds. No murmur. No friction rub. No gallop.   Pulmonary:     Effort: Pulmonary effort is normal. No respiratory distress.     Breath sounds: Normal breath sounds. No stridor. No wheezing, rhonchi or rales.  Chest:     Chest wall: No tenderness.  Abdominal:     General: Abdomen is flat. There is no distension.     Palpations: Abdomen is soft.     Tenderness: There is no abdominal tenderness. There is no guarding or rebound.  Musculoskeletal: Normal range of motion.        General: No swelling, tenderness, deformity or signs of injury.  Skin:    General: Skin is warm and dry.  Neurological:     General: No focal deficit present.     Mental Status: He is alert and oriented to person, place, and time. Mental status is at baseline.     Cranial Nerves: No cranial nerve deficit.     Sensory: No sensory deficit.     Motor: No weakness.  Psychiatric:        Mood and Affect: Mood normal.        Behavior: Behavior normal.      ED Treatments / Results  Labs (all labs ordered are listed, but only abnormal results  are displayed) Labs Reviewed  COMPREHENSIVE METABOLIC PANEL - Abnormal; Notable for the following components:      Result Value   Sodium 132 (*)    Chloride 93 (*)    Glucose, Bld 123 (*)    BUN 49 (*)    Creatinine, Ser 8.70 (*)    Calcium 8.2 (*)    Total Protein 6.3 (*)    Albumin 3.0 (*)    GFR calc non Af Amer 5 (*)    GFR calc Af Amer 6 (*)    Anion gap 17 (*)    All other components within normal limits  CBC WITH DIFFERENTIAL/PLATELET - Abnormal; Notable for the following components:   WBC 3.3 (*)    RBC 3.03 (*)    Hemoglobin 10.5 (*)    HCT 31.8 (*)    MCV 105.0 (*)    MCH 34.7 (*)    Platelets 79 (*)    Neutro Abs 1.4 (*)    All other components within normal limits  LACTATE DEHYDROGENASE - Abnormal; Notable for the following components:   LDH 244 (*)    All other components within normal limits  BRAIN NATRIURETIC PEPTIDE  C-REACTIVE PROTEIN  D-DIMER, QUANTITATIVE (NOT AT [facility])  FERRITIN  FIBRINOGEN  PROCALCITONIN  SEDIMENTATION RATE  CBC WITH DIFFERENTIAL/PLATELET  COMPREHENSIVE METABOLIC PANEL  C-REACTIVE PROTEIN  CK  D-DIMER, QUANTITATIVE (NOT AT [facility])  FERRITIN  MAGNESIUM  PHOSPHORUS    EKG EKG Interpretation  Date/Time:  Wednesday November 06 2018 14:13:32 EDT Ventricular Rate:  92 PR Interval:  162 QRS Duration: 72 QT Interval:  344 QTC Calculation: 425 R Axis:   60 Text Interpretation:  Normal sinus rhythm Normal ECG No significant change since last tracing Confirmed by Merrily Pew [phone]) on 11/06/2018 8:35:07 PM   Radiology Dg Chest Portable 1 View  Result Date: 11/06/2018 CLINICAL DATA:  COVID-19 infection. Shortness of breath. EXAM: PORTABLE CHEST 1 VIEW COMPARISON:  Chest x-ray dated 03/22/2018 and chest CT dated 01/06/2018 FINDINGS: Power port in place, unchanged with the tip just above the cavoatrial junction in good position. The heart size and pulmonary vascularity are normal. Increased mediastinal adenopathy since the prior chest  x-ray. New bibasilar linear atelectasis. No focal infiltrates or effusions. No acute bone abnormality. IMPRESSION: 1. Increased mediastinal adenopathy. 2. New bibasilar atelectasis. 3. Lungs otherwise clear. 4.  Aortic Atherosclerosis (ICD10-I70.0). Electronically Signed   By: [name] M.D.   On: 11/06/2018 16:49    Procedures Procedures (including critical care time)  Medications Ordered in ED Medications  sodium chloride flush (NS) 0.9 % injection 10-40 mL (has no administration in time range)  albuterol (VENTOLIN HFA) 108 (90 Base) MCG/ACT inhaler 2 puff (has no administration in time range)  guaiFENesin-dextromethorphan (ROBITUSSIN DM) 100-10 MG/5ML syrup 10 mL (has no administration in time range)  chlorpheniramine-HYDROcodone (TUSSIONEX) 10-8 MG/5ML suspension 5 mL (has no administration in time range)  vitamin C (ASCORBIC ACID) tablet 500 mg (has no administration in time range)  zinc sulfate capsule 220 mg (has no administration in time range)  acetaminophen (TYLENOL) tablet 650 mg (has no administration in time range)  ondansetron (ZOFRAN) tablet 4 mg (has no administration in time range)    Or  ondansetron (ZOFRAN) injection 4 mg (has no administration in time range)  albuterol (VENTOLIN HFA) 108 (90 Base) MCG/ACT inhaler 1 puff (has no administration in time range)  amitriptyline (ELAVIL) tablet 10 mg (has no administration in time range)  loratadine (CLARITIN) tablet 10 mg (has no administration in time range)  Ensure Plus liquid 237 mL (has no administration in time range)  fluticasone (FLONASE) 50 MCG/ACT nasal spray 1 spray (has no administration in time range)  gabapentin (NEURONTIN) capsule 100 mg (has no administration in time range)  pravastatin (PRAVACHOL) tablet 20 mg (has no administration in time range)  Melatonin TABS 5 mg (has no administration in time range)  midodrine (PROAMATINE) tablet 10 mg (has no administration in time range)  multivitamin (RENA-VIT)  tablet 1 tablet (has no administration in time range)  sodium chloride (OCEAN) 0.65 % nasal spray 1 spray (has no administration in time range)  polyvinyl alcohol (LIQUIFILM TEARS) 1.4 % ophthalmic solution 1 drop (has no administration in time range)  linagliptin (TRADJENTA) tablet 5 mg (has no administration in time range)  acetaminophen (TYLENOL) tablet 650 mg (650 mg Oral Given 11/06/18 1634)     Initial Impression / Assessment and Plan / ED Course  I have reviewed the triage vital signs and the nursing notes.  Pertinent labs & imaging results that were available during my care of the patient were reviewed by me and considered in my medical decision making (see chart for details).        Mark [name]. is a 73 y.o. male with history of ESRD on hemodialysis, diabetes, hypertension, and CLL with recent COVID-19 diagnosis on 7/24 presents to the ED complaining of worsening fever, cough, and shortness of breath over the past 2-3 days.  Patient reports he is having dyspnea on exertion to the point where he is having trouble getting around his  home and is now wearing his home oxygen at all times where he normally only wears it at night.  Chest x-ray showed bibasilar atelectasis with no signs of pneumonia.  CBC shows pancytopenia slightly worse than baseline.  CMP significant only for sequela of patient's chronic renal disease.  Given patient's worsening dyspnea and increased O2 requirement in the setting of his COVID-19 infection, the hospitalist was contacted for admission.  Patient will be admitted to the hospitalist service.  Final Clinical Impressions(s) / ED Diagnoses   Final diagnoses:  Hypoxia  COVID-19     Candie Chroman, MD 11/07/18 [id number]    Merrily Pew, MD 11/08/18 [phone]

## 2018-11-06 NOTE — ED Notes (Signed)
RN will collect labs. 

## 2018-11-06 NOTE — H&P (Signed)
History and Physical    Mark [name]. CHY:[id number] DOB: November 09, 1945 DOA: 11/06/2018  PCP: Albina Billet, MD  Patient coming from: Home  I have personally briefly reviewed patient's old medical records in [place]  Chief Complaint: Shortness of breath, DOE, fever, cough  HPI: Mark Dubin. is a 73 y.o. 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  He also developed diarrhea morning of admission.  Patient states he was asymptomatic on 10/25/2018 at time of initial test.  He is on chronic O2 at night due to mild-moderate restrictive lung disease however has had to use oxygen during the day now.  He checked his temperature this morning which was 101 Fahrenheit.  He otherwise denies any chest pain, palpitations, abdominal pain.  ED Course:  Initial vitals in the ED showed BP 126/57, pulse 92, RR 16, temp 102.6 Fahrenheit, SPO2 96% on room air.  Labs notable for WBC 3.3, hemoglobin 10.5, platelets 79,000, absolute neutrophil 1.4, absolute lymphocytes 1.8, sodium 132, potassium 4.3, bicarb 22, BUN 49, creatinine 8.7, serum glucose 123.  Portable chest x-ray showed Port-A-Cath in place, increased mediastinal adenopathy, bibasilar atelectasis, with otherwise clear lung fields.  Patient was given Tylenol and the hospitalist service was consulted to admit for further evaluation and management.  Review of Systems: All systems reviewed and are negative except as documented in history of present illness above.   Past Medical History:  Diagnosis Date  . Anemia   . Arthritis   . Bladder cancer  ([place])   . Chronic kidney disease   . Diabetes mellitus without complication ([place])   . Dyspnea    DOE  . ESRD on hemodialysis [facility])    Started dialysis in 2014, gets HD as of 2019 in [place] w/ Dr Rolly Salter on MWF schedule.    [name] GERD (gastroesophageal reflux disease)   . HOH (hard of hearing)   . Hypertension   . IBS (irritable bowel syndrome)   . Lymphoma ([place]) 09/27/2014  . Neuropathy    RIGHT LEG  . Stroke [facility])    TIA    Past Surgical History:  Procedure Laterality Date  . BACK SURGERY  2007  . CATARACT EXTRACTION W/PHACO Left 03/23/2016   Procedure: CATARACT EXTRACTION PHACO AND INTRAOCULAR LENS PLACEMENT (IOC);  Surgeon: Leandrew Koyanagi, MD;  Location: [facility] ORS;  Service: Ophthalmology;  Laterality: Left;  [place] 52.6 AP% 14.7 CDE 7.72 Fluid pack lot # [id number]  . CATARACT EXTRACTION W/PHACO Right 05/30/2016   Procedure: CATARACT EXTRACTION PHACO AND INTRAOCULAR LENS PLACEMENT ([place]);  Surgeon: Leandrew Koyanagi, MD;  Location: [facility] ORS;  Service: Ophthalmology;  Laterality: Right;  [place] 01:08 AP% 13.9 CDE 9.53  note: could not get IV in patient, so procedure done without anesthesia personell present, ok per Dr Wallace Going fluid pack lo t# [id number]  . CIRCUMCISION    . IVC FILTER INSERTION N/A 12/31/2017   Procedure: IVC FILTER INSERTION;  Surgeon: Algernon Huxley, MD;  Location: [place] CV LAB;  Service: Cardiovascular;  Laterality: N/A;  . PERIPHERAL VASCULAR CATHETERIZATION Left 08/19/2015   Procedure: A/V Shuntogram/Fistulagram;  Surgeon: Algernon Huxley, MD;  Location: [facility]  INVASIVE CV LAB;  Service: Cardiovascular;  Laterality: Left;  . PERIPHERAL VASCULAR CATHETERIZATION N/A 08/19/2015   Procedure: A/V Shunt Intervention;  Surgeon: Algernon Huxley, MD;  Location: [place] CV LAB;  Service: Cardiovascular;  Laterality: N/A;  . PORTA CATH INSERTION N/A 01/02/2018   Procedure: PORTA CATH INSERTION;  Surgeon: Katha Cabal, MD;  Location: [place] CV LAB;  Service:  Cardiovascular;  Laterality: N/A;    Social History:  reports that he quit smoking about 5 years ago. His smoking use included cigarettes. He has never used smokeless tobacco. He reports that he does not drink alcohol or use drugs.  Allergies  Allergen Reactions  . Acyclovir And Related   . Heparin Other (See Comments)    Excessive bleeding per pt.   . Ibuprofen Other (See Comments)    DUE TO DIALYSIS  . Multivitamin [Centrum] Other (See Comments)    DUE TO DIALYSIS  . Daypro [Oxaprozin] Swelling and Rash    Other reaction(s): Other (See Comments)  . Tape Rash    Family History  Problem Relation Age of Onset  . Cancer Mother   . Kidney cancer Neg Hx   . Prostate cancer Neg Hx   . Kidney failure Neg Hx   . Bladder Cancer Neg Hx      Prior to Admission medications   Medication Sig Start Date End Date Taking? Authorizing Provider  acyclovir (ZOVIRAX) 400 MG tablet  09/03/18   [provider]  albuterol (PROVENTIL) (2.5 MG/3ML) 0.083% nebulizer solution Take 2.5 mg by nebulization every 6 (six) hours as needed for wheezing or shortness of breath.    [provider]  B Complex-C-Folic Acid (RENAL VITAMIN PO) Take by mouth. Renal tab Calcium 2 BID    [provider]  butalbital-acetaminophen-caffeine (FIORICET) 50-325-40 MG tablet Take by mouth. 08/21/18 11/19/18  [provider]  cetirizine (ZYRTEC) 10 MG tablet Take 10 mg by mouth as needed for allergies.    [provider]  Difluprednate (DUREZOL) 0.05 % EMUL Place 1 drop into the left eye 2 (two) times daily.     [provider]  docusate sodium (COLACE) 100 MG capsule Take 100 mg by mouth daily as needed for mild constipation.    [provider]  ENSURE PLUS (ENSURE PLUS) LIQD Take 237 mLs by mouth.    [provider]  fluticasone (FLONASE) 50 MCG/ACT nasal spray Place 1 spray into both nostrils daily as needed for allergies.  12/14/14   [provider]   gabapentin (NEURONTIN) 100 MG capsule Take 1 capsule (100 mg total) by mouth 3 (three) times daily. Patient taking differently: Take 100 mg by mouth at bedtime.  03/02/16   Edrick Kins, DPM  glimepiride (AMARYL) 2 MG tablet Take 2 mg by mouth. 1/2 tab Mon, Wed, Fri 10/06/14   [provider]  linagliptin (TRADJENTA) 5 MG TABS tablet Take 5 mg by mouth daily.    [provider]  lovastatin (MEVACOR) 20 MG tablet Take 1 tablet by mouth daily. 12/25/16   [provider]  Melatonin 5 MG TABS Take by mouth at bedtime.    [provider]  metoCLOPramide (REGLAN) 5 MG tablet Take 5 mg by mouth 3 (three) times daily before meals. Patient takes 1 tab with each meal.    [provider]  midodrine (PROAMATINE) 10 MG tablet Take 1 tablet (10 mg total) by mouth every dialysis (MWF). 01/08/18   Dustin Flock, MD  multivitamin (RENA-VIT)  TABS tablet Take 1 tablet by mouth at bedtime.     [provider]  nystatin cream (MYCOSTATIN) Apply 1 application topically 2 (two) times daily as needed (irritation).     [provider]  polyethylene glycol (MIRALAX / GLYCOLAX) packet Take 17 g by mouth daily. Patient taking differently: Take 17 g by mouth daily as needed.  01/16/18   Lavina Hamman, MD  Polyvinyl Alcohol-Povidone PF (REFRESH) 1.4-0.6 % SOLN Apply 1 drop to eye 3 (three) times daily as needed (Dry eyes).    [provider]  silver sulfADIAZINE (SILVADENE) 1 % cream APPLY 1 APPLICATION TOPICALY DAILY Patient taking differently: Apply 1 application topically daily as needed (sores).  04/12/17   Edrick Kins, DPM  sodium chloride (OCEAN) 0.65 % SOLN nasal spray Place 1 spray into both nostrils as needed for congestion. 01/08/18   Dustin Flock, MD    Physical Exam: Vitals:   11/06/18 1900 11/06/18 2000 11/06/18 2145 11/06/18 2230  BP:    (!) 113/96  Pulse: 82 69 71 75  Resp: (!) 22     Temp:      TempSrc:      SpO2: 99% 99%  100% 100%    Constitutional: Resting supine in bed, NAD, calm, comfortable Eyes: PERRL, lids and conjunctivae normal ENMT: Mucous membranes are moist. Posterior pharynx clear of any exudate or lesions.Normal dentition.  Neck: normal, supple, no masses. Respiratory: clear to auscultation bilaterally, no wheezing, no crackles. Normal respiratory effort. No accessory muscle use.  Cardiovascular: Regular rate and rhythm, no murmurs / rubs / gallops. No extremity edema. 2+ pedal pulses.  aVF left forearm with palpable thrill.  Port-A-Cath in place right chest wall. Abdomen: no tenderness, no masses palpated. No hepatosplenomegaly. Bowel sounds positive.  Musculoskeletal: no clubbing / cyanosis. No joint deformity upper and lower extremities. Good ROM, no contractures. Normal muscle tone.  Skin: no rashes, lesions, ulcers. No induration Neurologic: CN 2-12 grossly intact. Sensation intact, Strength 5/5 in all 4.  Psychiatric: Normal judgment and insight. Alert and oriented x 3. Normal mood.     Labs on Admission: I have personally reviewed following labs and imaging studies  CBC: Recent Labs  Lab 11/06/18 1900  WBC 3.3*  NEUTROABS 1.4*  HGB 10.5*  HCT 31.8*  MCV 105.0*  PLT 79*   Basic Metabolic Panel: Recent Labs  Lab 11/06/18 1900  NA 132*  K 4.3  CL 93*  CO2 22  GLUCOSE 123*  BUN 49*  CREATININE 8.70*  CALCIUM 8.2*   GFR: Estimated Creatinine Clearance: 8.1 mL/min (A) (by C-G formula based on SCr of 8.7 mg/dL (H)). Liver Function Tests: Recent Labs  Lab 11/06/18 1900  AST 28  ALT 27  ALKPHOS 45  BILITOT 1.1  PROT 6.3*  ALBUMIN 3.0*   No results for input(s): LIPASE, AMYLASE in the last 168 hours. No results for input(s): AMMONIA in the last 168 hours. Coagulation Profile: No results for input(s): INR, PROTIME in the last 168 hours. Cardiac Enzymes: No results for input(s): CKTOTAL, CKMB, CKMBINDEX, TROPONINI in the last 168 hours. BNP (last 3 results) No  results for input(s): PROBNP in the last 8760 hours. HbA1C: No results for input(s): HGBA1C in the last 72 hours. CBG: No results for input(s): GLUCAP in the last 168 hours. Lipid Profile: No results for input(s): CHOL, HDL, LDLCALC, TRIG, CHOLHDL, LDLDIRECT in the last 72 hours. Thyroid Function Tests: No results for input(s): TSH, T4TOTAL, FREET4, T3FREE, THYROIDAB in the last  72 hours. Anemia Panel: No results for input(s): VITAMINB12, FOLATE, FERRITIN, TIBC, IRON, RETICCTPCT in the last 72 hours. Urine analysis:    Component Value Date/Time   COLORURINE YELLOW 01/12/2018 0129   APPEARANCEUR CLOUDY (A) 01/12/2018 0129   APPEARANCEUR Clear 01/06/2016 1433   LABSPEC 1.014 01/12/2018 0129   LABSPEC 1.014 10/20/2013 1835   PHURINE 5.0 01/12/2018 0129   GLUCOSEU 50 (A) 01/12/2018 0129   GLUCOSEU 150 mg/dL 10/20/2013 1835   HGBUR MODERATE (A) 01/12/2018 0129   BILIRUBINUR NEGATIVE 01/12/2018 0129   BILIRUBINUR Negative 01/06/2016 1433   BILIRUBINUR Negative 10/20/2013 1835   KETONESUR NEGATIVE 01/12/2018 0129   PROTEINUR >=300 (A) 01/12/2018 0129   NITRITE NEGATIVE 01/12/2018 0129   LEUKOCYTESUR NEGATIVE 01/12/2018 0129   LEUKOCYTESUR Negative 01/06/2016 1433   LEUKOCYTESUR 1+ 10/20/2013 1835    Radiological Exams on Admission: Dg Chest Portable 1 View  Result Date: 11/06/2018 CLINICAL DATA:  COVID-19 infection. Shortness of breath. EXAM: PORTABLE CHEST 1 VIEW COMPARISON:  Chest x-ray dated 03/22/2018 and chest CT dated 01/06/2018 FINDINGS: Power port in place, unchanged with the tip just above the cavoatrial junction in good position. The heart size and pulmonary vascularity are normal. Increased mediastinal adenopathy since the prior chest x-ray. New bibasilar linear atelectasis. No focal infiltrates or effusions. No acute bone abnormality. IMPRESSION: 1. Increased mediastinal adenopathy. 2. New bibasilar atelectasis. 3. Lungs otherwise clear. 4.  Aortic Atherosclerosis  (ICD10-I70.0). Electronically Signed   By: [name] M.D.   On: 11/06/2018 16:49    EKG: Independently reviewed.  Sinus rhythm without acute ischemic changes.  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 is a 73 y.o. 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   COVID-19 viral infection: Associated with recent symptoms of fevers, dyspnea, nonproductive cough, poor appetite/p.o. intake, and diarrhea.  Chest x-ray without groundglass opacities/evidence of pneumonia.  Question symptoms primarily related to COVID-19 infection versus some component of underlying lymphoma as well. -SARS-CoV-2 +10/25/2018 -Admit to COVID unit -Obtain inflammatory markers, daily labs ordered -Continue supportive care -No indication for steroids, Remdesivir, and/or Actemra at time of admission, consider use if status changes -Continue supplemental oxygen as needed, wean off as able -Continue flutter valve, incentive spirometer, albuterol inhaler/nebulizer -Continue antitussives, vitamin C, zinc  ESRD on TThS HD: Last HD 11/05/2018.  No emergent indication for HD on admit. -Nephrology consult in a.m. 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  Follows with oncology, Dr. Rogue Bussing.  Pancytopenia: WBC and hemoglobin slightly decreased from prior but  otherwise stable.  Thrombocytopenia slightly worsening.  Patient reports occasional nosebleeds.  No active bleeding present.  Continue to monitor.  Type 2 diabetes: Continue Home linagliptin  Hypertension: Not currently on antihypertensives.  BP control with HD.  Hyperlipidemia: Continue statin.  DVT prophylaxis: SCDs Code Status: Partial code, DNI otherwise full scope of care, confirmed with patient. Family Communication: Discussed with patient, he discussed with wife. Disposition Plan: Pending  clinical progress Consults called: None Admission status: Observation   Zada Finders MD [facility]  If 7PM-7AM, please contact night-coverage [email or web address]  11/06/2018, 11:09 PM

## 2018-11-06 NOTE — ED Notes (Signed)
Pt refusing peripheral sticks for blood. Reports it has to come from his port. IV team consulted for port access.

## 2018-11-07 ENCOUNTER — Observation Stay (HOSPITAL_COMMUNITY): Payer: Medicare HMO

## 2018-11-07 ENCOUNTER — Inpatient Hospital Stay (HOSPITAL_COMMUNITY): Payer: Medicare HMO

## 2018-11-07 DIAGNOSIS — C8303 Small cell B-cell lymphoma, intra-abdominal lymph nodes: Secondary | ICD-10-CM | POA: Diagnosis present

## 2018-11-07 DIAGNOSIS — J984 Other disorders of lung: Secondary | ICD-10-CM | POA: Diagnosis present

## 2018-11-07 DIAGNOSIS — H9190 Unspecified hearing loss, unspecified ear: Secondary | ICD-10-CM | POA: Diagnosis present

## 2018-11-07 DIAGNOSIS — E1122 Type 2 diabetes mellitus with diabetic chronic kidney disease: Secondary | ICD-10-CM | POA: Diagnosis present

## 2018-11-07 DIAGNOSIS — U071 COVID-19: Secondary | ICD-10-CM | POA: Diagnosis present

## 2018-11-07 DIAGNOSIS — R0602 Shortness of breath: Secondary | ICD-10-CM | POA: Diagnosis present

## 2018-11-07 DIAGNOSIS — T380X5A Adverse effect of glucocorticoids and synthetic analogues, initial encounter: Secondary | ICD-10-CM | POA: Diagnosis present

## 2018-11-07 DIAGNOSIS — I120 Hypertensive chronic kidney disease with stage 5 chronic kidney disease or end stage renal disease: Secondary | ICD-10-CM | POA: Diagnosis present

## 2018-11-07 DIAGNOSIS — N186 End stage renal disease: Secondary | ICD-10-CM | POA: Diagnosis present

## 2018-11-07 DIAGNOSIS — E875 Hyperkalemia: Secondary | ICD-10-CM | POA: Diagnosis present

## 2018-11-07 DIAGNOSIS — E1142 Type 2 diabetes mellitus with diabetic polyneuropathy: Secondary | ICD-10-CM | POA: Diagnosis present

## 2018-11-07 DIAGNOSIS — Z992 Dependence on renal dialysis: Secondary | ICD-10-CM | POA: Diagnosis not present

## 2018-11-07 DIAGNOSIS — K219 Gastro-esophageal reflux disease without esophagitis: Secondary | ICD-10-CM | POA: Diagnosis present

## 2018-11-07 DIAGNOSIS — D631 Anemia in chronic kidney disease: Secondary | ICD-10-CM | POA: Diagnosis present

## 2018-11-07 DIAGNOSIS — Z961 Presence of intraocular lens: Secondary | ICD-10-CM | POA: Diagnosis present

## 2018-11-07 DIAGNOSIS — D61818 Other pancytopenia: Secondary | ICD-10-CM | POA: Diagnosis present

## 2018-11-07 DIAGNOSIS — R0902 Hypoxemia: Secondary | ICD-10-CM | POA: Diagnosis present

## 2018-11-07 DIAGNOSIS — Z9842 Cataract extraction status, left eye: Secondary | ICD-10-CM | POA: Diagnosis not present

## 2018-11-07 DIAGNOSIS — E1165 Type 2 diabetes mellitus with hyperglycemia: Secondary | ICD-10-CM | POA: Diagnosis present

## 2018-11-07 DIAGNOSIS — I679 Cerebrovascular disease, unspecified: Secondary | ICD-10-CM | POA: Diagnosis present

## 2018-11-07 DIAGNOSIS — K580 Irritable bowel syndrome with diarrhea: Secondary | ICD-10-CM | POA: Diagnosis present

## 2018-11-07 DIAGNOSIS — E785 Hyperlipidemia, unspecified: Secondary | ICD-10-CM | POA: Diagnosis present

## 2018-11-07 DIAGNOSIS — M1990 Unspecified osteoarthritis, unspecified site: Secondary | ICD-10-CM | POA: Diagnosis present

## 2018-11-07 DIAGNOSIS — E8889 Other specified metabolic disorders: Secondary | ICD-10-CM | POA: Diagnosis present

## 2018-11-07 DIAGNOSIS — C9110 Chronic lymphocytic leukemia of B-cell type not having achieved remission: Secondary | ICD-10-CM | POA: Diagnosis present

## 2018-11-07 LAB — CBC WITH DIFFERENTIAL/PLATELET
Abs Immature Granulocytes: 0 10*3/uL (ref 0.00–0.07)
Basophils Absolute: 0 10*3/uL (ref 0.0–0.1)
Basophils Relative: 0 %
Eosinophils Absolute: 0 10*3/uL (ref 0.0–0.5)
Eosinophils Relative: 1 %
HCT: 30.4 % — ABNORMAL LOW (ref 39.0–52.0)
Hemoglobin: 10.3 g/dL — ABNORMAL LOW (ref 13.0–17.0)
Immature Granulocytes: 0 %
Lymphocytes Relative: 45 %
Lymphs Abs: 1.2 10*3/uL (ref 0.7–4.0)
MCH: 35.6 pg — ABNORMAL HIGH (ref 26.0–34.0)
MCHC: 33.9 g/dL (ref 30.0–36.0)
MCV: 105.2 fL — ABNORMAL HIGH (ref 80.0–100.0)
Monocytes Absolute: 0.3 10*3/uL (ref 0.1–1.0)
Monocytes Relative: 11 %
Neutro Abs: 1.1 10*3/uL — ABNORMAL LOW (ref 1.7–7.7)
Neutrophils Relative %: 43 %
Platelets: 80 10*3/uL — ABNORMAL LOW (ref 150–400)
RBC: 2.89 MIL/uL — ABNORMAL LOW (ref 4.22–5.81)
RDW: 12.8 % (ref 11.5–15.5)
WBC: 2.6 10*3/uL — ABNORMAL LOW (ref 4.0–10.5)
nRBC: 0 % (ref 0.0–0.2)

## 2018-11-07 LAB — BRAIN NATRIURETIC PEPTIDE: B Natriuretic Peptide: 326.7 pg/mL — ABNORMAL HIGH (ref 0.0–100.0)

## 2018-11-07 LAB — COMPREHENSIVE METABOLIC PANEL
ALT: 29 U/L (ref 0–44)
AST: 28 U/L (ref 15–41)
Albumin: 2.8 g/dL — ABNORMAL LOW (ref 3.5–5.0)
Alkaline Phosphatase: 45 U/L (ref 38–126)
Anion gap: 19 — ABNORMAL HIGH (ref 5–15)
BUN: 56 mg/dL — ABNORMAL HIGH (ref 8–23)
CO2: 21 mmol/L — ABNORMAL LOW (ref 22–32)
Calcium: 7.9 mg/dL — ABNORMAL LOW (ref 8.9–10.3)
Chloride: 89 mmol/L — ABNORMAL LOW (ref 98–111)
Creatinine, Ser: 9.51 mg/dL — ABNORMAL HIGH (ref 0.61–1.24)
GFR calc Af Amer: 6 mL/min — ABNORMAL LOW (ref >60)
GFR calc non Af Amer: 5 mL/min — ABNORMAL LOW (ref >60)
Glucose, Bld: 326 mg/dL — ABNORMAL HIGH (ref 70–99)
Potassium: 4.4 mmol/L (ref 3.5–5.1)
Sodium: 129 mmol/L — ABNORMAL LOW (ref 135–145)
Total Bilirubin: 0.9 mg/dL (ref 0.3–1.2)
Total Protein: 6 g/dL — ABNORMAL LOW (ref 6.5–8.1)

## 2018-11-07 LAB — PHOSPHORUS: Phosphorus: 4.9 mg/dL — ABNORMAL HIGH (ref 2.5–4.6)

## 2018-11-07 LAB — GLUCOSE, CAPILLARY
Glucose-Capillary: 207 mg/dL — ABNORMAL HIGH (ref 70–99)
Glucose-Capillary: 219 mg/dL — ABNORMAL HIGH (ref 70–99)
Glucose-Capillary: 140 mg/dL — ABNORMAL HIGH (ref 70–99)
Glucose-Capillary: 161 mg/dL — ABNORMAL HIGH (ref 70–99)

## 2018-11-07 LAB — HEMOGLOBIN A1C
Hgb A1c MFr Bld: 6.8 % — ABNORMAL HIGH (ref 4.8–5.6)
Mean Plasma Glucose: 148.46 mg/dL

## 2018-11-07 LAB — C-REACTIVE PROTEIN
CRP: 26.7 mg/dL — ABNORMAL HIGH (ref <1.0)
CRP: 24.5 mg/dL — ABNORMAL HIGH (ref <1.0)

## 2018-11-07 LAB — FIBRINOGEN: Fibrinogen: 654 mg/dL — ABNORMAL HIGH (ref 210–475)

## 2018-11-07 LAB — MRSA PCR SCREENING: MRSA by PCR: NEGATIVE

## 2018-11-07 LAB — MAGNESIUM: Magnesium: 1.9 mg/dL (ref 1.7–2.4)

## 2018-11-07 LAB — D-DIMER, QUANTITATIVE
D-Dimer, Quant: 4.5 ug/mL-FEU — ABNORMAL HIGH (ref 0.00–0.50)
D-Dimer, Quant: 3.35 ug/mL-FEU — ABNORMAL HIGH (ref 0.00–0.50)

## 2018-11-07 LAB — SEDIMENTATION RATE: Sed Rate: 60 mm/hr — ABNORMAL HIGH (ref 0–16)

## 2018-11-07 LAB — PROCALCITONIN
Procalcitonin: 1.25 ng/mL
Procalcitonin: 1.25 ng/mL

## 2018-11-07 LAB — FERRITIN
Ferritin: 4171 ng/mL — ABNORMAL HIGH (ref 24–336)
Ferritin: 4034 ng/mL — ABNORMAL HIGH (ref 24–336)

## 2018-11-07 LAB — LACTATE DEHYDROGENASE: LDH: 244 U/L — ABNORMAL HIGH (ref 98–192)

## 2018-11-07 LAB — CK: Total CK: 98 U/L (ref 49–397)

## 2018-11-07 MED ORDER — ALTEPLASE 2 MG IJ SOLR: 2.0000 mg | Freq: Once | INTRAMUSCULAR | Status: DC | PRN

## 2018-11-07 MED ORDER — HEPARIN SODIUM (PORCINE) 1000 UNIT/ML DIALYSIS
1000.0000 [IU] | INTRAMUSCULAR | Status: DC | PRN
  Filled 2018-11-07: qty 1

## 2018-11-07 MED ORDER — SODIUM CHLORIDE 0.9 % IV SOLN: 100.0000 mL | INTRAVENOUS | Status: DC | PRN

## 2018-11-07 MED ORDER — INSULIN GLARGINE 100 UNIT/ML ~~LOC~~ SOLN
10.0000 [IU] | Freq: Every day | SUBCUTANEOUS | Status: DC
  Administered 2018-11-07 – 2018-11-09 (×3): 10 [IU] via SUBCUTANEOUS
  Filled 2018-11-07 (×3): qty 0.1

## 2018-11-07 MED ORDER — ACETAMINOPHEN 500 MG PO TABS
1000.0000 mg | ORAL_TABLET | Freq: Four times a day (QID) | ORAL | Status: DC | PRN
  Administered 2018-11-07 – 2018-11-09 (×3): 1000 mg via ORAL
  Filled 2018-11-07 (×3): qty 2

## 2018-11-07 MED ORDER — PENTAFLUOROPROP-TETRAFLUOROETH EX AERO: 1.0000 "application " | INHALATION_SPRAY | CUTANEOUS | Status: DC | PRN

## 2018-11-07 MED ORDER — LIDOCAINE-PRILOCAINE 2.5-2.5 % EX CREA
1.0000 "application " | TOPICAL_CREAM | CUTANEOUS | Status: DC | PRN
  Filled 2018-11-07: qty 5

## 2018-11-07 MED ORDER — DOXERCALCIFEROL 0.5 MCG PO CAPS
0.5000 ug | ORAL_CAPSULE | ORAL | Status: DC
  Administered 2018-11-07: 0.5 ug via ORAL
  Filled 2018-11-07 (×3): qty 1

## 2018-11-07 MED ORDER — INSULIN ASPART 100 UNIT/ML ~~LOC~~ SOLN
0.0000 [IU] | Freq: Three times a day (TID) | SUBCUTANEOUS | Status: DC
  Administered 2018-11-07: 1 [IU] via SUBCUTANEOUS
  Administered 2018-11-07 – 2018-11-08 (×2): 3 [IU] via SUBCUTANEOUS
  Administered 2018-11-08: 2 [IU] via SUBCUTANEOUS
  Administered 2018-11-08: 1 [IU] via SUBCUTANEOUS
  Administered 2018-11-09: 9 [IU] via SUBCUTANEOUS
  Administered 2018-11-09 – 2018-11-10 (×2): 5 [IU] via SUBCUTANEOUS
  Administered 2018-11-10: 3 [IU] via SUBCUTANEOUS

## 2018-11-07 MED ORDER — CHLORHEXIDINE GLUCONATE CLOTH 2 % EX PADS
6.0000 | MEDICATED_PAD | Freq: Every day | CUTANEOUS | Status: DC
  Administered 2018-11-07 – 2018-11-09 (×2): 6 via TOPICAL

## 2018-11-07 MED ORDER — LIDOCAINE HCL (PF) 1 % IJ SOLN: 5.0000 mL | INTRAMUSCULAR | Status: DC | PRN

## 2018-11-07 MED ORDER — ORAL CARE MOUTH RINSE
15.0000 mL | Freq: Two times a day (BID) | OROMUCOSAL | Status: DC
  Administered 2018-11-07 – 2018-11-10 (×6): 15 mL via OROMUCOSAL

## 2018-11-07 NOTE — Progress Notes (Addendum)
PROGRESS NOTE    Marlowe J [employer].  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  He also developed diarrhea morning of admission.  Patient states he was asymptomatic on 10/25/2018 at time of initial test.  He is on chronic O2 at night due to mild-moderate restrictive lung disease however has had to use oxygen during the day until 2days ago, temp was 101, presented to ED and admitted   Assessment & Plan:   COVID-19 viral infection: Associated with recent symptoms of fevers, dyspnea, nonproductive cough, poor appetite/p.o. intake, and diarrhea.  Chest x-ray without groundglass opacities/evidence of pneumonia, basilar atelectasis noted.   -SARS COVID PCR test was positive on 7/24 which is 12 days ago  -We will repeat x-ray after dialysis today, febrile to 102.4 this morning -Inflammatory markers considerably abnormal however no evidence of new hypoxia or abnormal x-ray findings at this time yet, there may be some overlap of symptoms with his advanced lymphoma -Follow CRP, ferritin, d-dimer, also repeat procalcitonin tomorrow -Low threshold to start steroids, +/- Remdesivir -Continue albuterol MDI, incentive spirometer, flutter valve -Off oxygen during the daytime  ESRD on TThS HD: Last HD 11/05/2018. 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  Follows with oncology, Dr. Rogue Bussing in [facility]  Pancytopenia: WBC and  hemoglobin slightly decreased from prior but otherwise stable.   -Due to combination of CKD and CLL   Type 2 diabetes: Continue Home linagliptin -CBGs uncontrolled, add low-dose Lantus, sliding scale and for follow-up hemoglobin A1c  Hypertension: Not currently on antihypertensives.  BP control with HD.  Hyperlipidemia: Continue statin.  DVT prophylaxis: SCDs, due to low platelets Code Status: Partial code, DNI  Family Communication:  No family at bedside will update spouse Disposition Plan:  Home in 1 to 2 days pending stability  Consultants:   Renal   Procedures:   Antimicrobials:    Subjective: -Fever and cough this morning, denies any dyspnea at this time  Objective: Vitals:   11/06/18 2345 11/07/18 0015 11/07/18 0104 11/07/18 0741  BP: (!) 125/58 (!) 120/46 (!) 154/51 (!) 124/57  Pulse: 76 75 95 80  Resp:   (!) 24   Temp:   99.5 F (37.5 C) (!) 102.4 F (39.1 C)  TempSrc:   Oral Oral  SpO2: 100% 99% 99% 100%  Weight:   81.6 kg   Height:   5\' 11"  (1.803 m)     Intake/Output Summary (Last 24 hours) at 11/07/2018 1045 Last data filed at 11/07/2018 0145 Gross per 24 hour  Intake 360 ml  Output -  Net 360 ml   Filed Weights   11/07/18 0104  Weight: 81.6 kg    Examination:  General exam: Elderly frail gentleman, sitting up in bed, AAO x3 Respiratory system: Decreased breath sounds at both bases, otherwise clear  cardiovascular system: S1 & S2 heard, RRR.  Gastrointestinal system: Abdomen is nondistended, soft and nontender.Normal bowel sounds heard. Central nervous system: Alert and oriented. No focal neurological deficits. Extremities: No edema, left arm AV  fistula Skin: No rashes, lesions or ulcers Psychiatry: Mood & affect appropriate.     Data Reviewed:   CBC: Recent Labs  Lab 11/06/18 1900 11/07/18 0500  WBC 3.3* 2.6*  NEUTROABS 1.4* 1.1*  HGB 10.5* 10.3*  HCT 31.8* 30.4*  MCV 105.0* 105.2*  PLT 79* 80*   Basic Metabolic Panel:  Recent Labs  Lab 11/06/18 1900 11/07/18 0500  NA 132* 129*  K 4.3 4.4  CL 93* 89*  CO2 22 21*  GLUCOSE 123* 326*  BUN 49* 56*  CREATININE 8.70* 9.51*  CALCIUM 8.2* 7.9*  MG  --  1.9  PHOS  --  4.9*   GFR: Estimated Creatinine Clearance: 7.4 mL/min (A) (by C-G formula based on SCr of 9.51 mg/dL (H)). Liver Function Tests: Recent Labs  Lab 11/06/18 1900 11/07/18 0500  AST 28 28  ALT 27 29  ALKPHOS 45 45  BILITOT 1.1 0.9  PROT 6.3* 6.0*  ALBUMIN 3.0* 2.8*   No results for input(s): LIPASE, AMYLASE in the last 168 hours. No results for input(s): AMMONIA in the last 168 hours. Coagulation Profile: No results for input(s): INR, PROTIME in the last 168 hours. Cardiac Enzymes: Recent Labs  Lab 11/07/18 0500  CKTOTAL 98   BNP (last 3 results) No results for input(s): PROBNP in the last 8760 hours. HbA1C: No results for input(s): HGBA1C in the last 72 hours. CBG: Recent Labs  Lab 11/07/18 0738  GLUCAP 207*   Lipid Profile: No results for input(s): CHOL, HDL, LDLCALC, TRIG, CHOLHDL, LDLDIRECT in the last 72 hours. Thyroid Function Tests: No results for input(s): TSH, T4TOTAL, FREET4, T3FREE, THYROIDAB in the last 72 hours. Anemia Panel: Recent Labs    11/06/18 2347 11/07/18 0500  FERRITIN 4,171* 4,034*   Urine analysis:    Component Value Date/Time   COLORURINE YELLOW 01/12/2018 0129   APPEARANCEUR CLOUDY (A) 01/12/2018 0129   APPEARANCEUR Clear 01/06/2016 1433   LABSPEC 1.014 01/12/2018 0129   LABSPEC 1.014 10/20/2013 1835   PHURINE 5.0 01/12/2018 0129   GLUCOSEU 50 (A) 01/12/2018 0129   GLUCOSEU 150 mg/dL 10/20/2013 1835   HGBUR MODERATE (A) 01/12/2018 0129   BILIRUBINUR NEGATIVE 01/12/2018 0129   BILIRUBINUR Negative 01/06/2016 1433   BILIRUBINUR Negative 10/20/2013 1835   KETONESUR NEGATIVE 01/12/2018 0129   PROTEINUR >=300 (A) 01/12/2018 0129   NITRITE NEGATIVE 01/12/2018 0129   LEUKOCYTESUR NEGATIVE 01/12/2018 0129   LEUKOCYTESUR Negative  01/06/2016 1433   LEUKOCYTESUR 1+ 10/20/2013 1835   Sepsis Labs: @LABRCNTIP (procalcitonin:4,lacticidven:4)  ) Recent Results (from the past 240 hour(s))  MRSA PCR Screening     Status: None   Collection Time: 11/07/18  1:55 AM   Specimen: Nasal Mucosa; Nasopharyngeal  Result Value Ref Range Status   MRSA by PCR NEGATIVE NEGATIVE Final    Comment:        The GeneXpert MRSA Assay (FDA approved for NASAL specimens only), is one component of a comprehensive MRSA colonization surveillance program. It is not intended to diagnose MRSA infection nor to guide or monitor treatment for MRSA infections. Performed at [facility], [place] [address], [place], [place] [address]          Radiology Studies: Dg Chest Portable 1 View  Result Date: 11/06/2018 CLINICAL DATA:  COVID-19 infection. Shortness of breath. EXAM: PORTABLE CHEST 1 VIEW COMPARISON:  Chest x-ray dated 03/22/2018 and chest CT dated 01/06/2018 FINDINGS: Power port in place, unchanged with the tip just above the cavoatrial junction in good position. The heart  size and pulmonary vascularity are normal. Increased mediastinal adenopathy since the prior chest x-ray. New bibasilar linear atelectasis. No focal infiltrates or effusions. No acute bone abnormality. IMPRESSION: 1. Increased mediastinal adenopathy. 2. New bibasilar atelectasis. 3. Lungs otherwise clear. 4.  Aortic Atherosclerosis (ICD10-I70.0). Electronically Signed   By: [name] M.D.   On: 11/06/2018 16:49        Scheduled Meds: . albuterol  2 puff Inhalation Q6H  . feeding supplement (ENSURE ENLIVE)  237 mL Oral BID BM  . gabapentin  100 mg Oral QHS  . insulin aspart  0-9 Units Subcutaneous TID WC  . linagliptin  5 mg Oral Daily  . loratadine  10 mg Oral Daily  . mouth rinse  15 mL Mouth Rinse BID  . Melatonin  6 mg Oral QHS  . midodrine  10 mg Oral Q T,Th,Sa-HD  . multivitamin  1 tablet Oral QHS  . pravastatin  20 mg Oral q1800  . vitamin C  500  mg Oral Daily  . zinc sulfate  220 mg Oral Daily   Continuous Infusions:   LOS: 1 day    Time spent: 46min    Domenic Polite, MD [facility]   11/07/2018, 10:45 AM

## 2018-11-07 NOTE — Consult Note (Signed)
Reason for Consult: To manage dialysis and dialysis related needs Referring Physician: Eligha Bridegroom. is an 73 y.o. male with past medical history significant for stage IV SLL/CLL-previously on chemo but now held secondary to poor tolerance followed by oncology.  He also has diabetes mellitus, hypertension, hyperlipidemia, cerebrovascular disease and end-stage renal disease-dialyzes at the [facility] and is followed by central [facility].  He was diagnosed with COVID on 7/24 but presented to the hospital yesterday with worsening symptoms of shortness of breath and fever.  He has been admitted for supportive care and we are asked to provide routine dialysis.  Patient was not physically seen as he is on COVID isolation.  This is done in an effort to preserve PPE and to limit exposure to other providers.  I did speak with Dr. Broadus John regarding specifics of exam   Dialyzes at [facility] [place] [facility].  TTS 3-1/2 hours HD [place] 2K/3 calcium, Dialyzer unknown, Heparin no. Access left aVF.  400/800 Hectorol 0.5 mics q. treatment and Venofer 50 weekly  Past Medical History:  Diagnosis Date  . Anemia   . Arthritis   . Bladder cancer ([place])   . Chronic kidney disease   . Diabetes mellitus without complication ([place])   . Dyspnea    DOE  . ESRD on hemodialysis [facility])    Started dialysis in 2014, gets HD as of 2019 in [place] w/ Dr Rolly Salter on MWF schedule.    [name] GERD (gastroesophageal reflux disease)   . HOH (hard of hearing)   . Hypertension   . IBS (irritable bowel syndrome)   . Lymphoma ([place]) 09/27/2014  . Neuropathy    RIGHT LEG  . Stroke [facility])    TIA    Past Surgical History:  Procedure Laterality Date  . BACK SURGERY  2007  . CATARACT EXTRACTION W/PHACO Left 03/23/2016   Procedure: CATARACT EXTRACTION PHACO AND INTRAOCULAR LENS PLACEMENT (IOC);  Surgeon: Leandrew Koyanagi, MD;  Location: [facility] ORS;  Service: Ophthalmology;  Laterality:  Left;  [place] 52.6 AP% 14.7 CDE 7.72 Fluid pack lot # [id number]  . CATARACT EXTRACTION W/PHACO Right 05/30/2016   Procedure: CATARACT EXTRACTION PHACO AND INTRAOCULAR LENS PLACEMENT ([place]);  Surgeon: Leandrew Koyanagi, MD;  Location: [facility] ORS;  Service: Ophthalmology;  Laterality: Right;  [place] 01:08 AP% 13.9 CDE 9.53  note: could not get IV in patient, so procedure done without anesthesia personell present, ok per Dr Wallace Going fluid pack lo t# [id number]  . CIRCUMCISION    . IVC FILTER INSERTION N/A 12/31/2017   Procedure: IVC FILTER INSERTION;  Surgeon: Algernon Huxley, MD;  Location: [place] CV LAB;  Service: Cardiovascular;  Laterality: N/A;  . PERIPHERAL VASCULAR CATHETERIZATION Left 08/19/2015   Procedure: A/V Shuntogram/Fistulagram;  Surgeon: Algernon Huxley, MD;  Location: [place] [facility] LAB;  Service: Cardiovascular;  Laterality: Left;  . PERIPHERAL VASCULAR CATHETERIZATION N/A 08/19/2015   Procedure: A/V Shunt Intervention;  Surgeon: Algernon Huxley, MD;  Location: [place] [facility];  Service: Cardiovascular;  Laterality: N/A;  . PORTA CATH INSERTION N/A 01/02/2018   Procedure: PORTA CATH INSERTION;  Surgeon: Katha Cabal, MD;  Location: [place] [facility];  Service: Cardiovascular;  Laterality: N/A;    Family History  Problem Relation Age of Onset  . Cancer Mother   . Kidney cancer Neg Hx   . Prostate cancer Neg Hx   . Kidney failure Neg Hx   . Bladder Cancer Neg Hx  Social History:  reports that he quit smoking about 5 years ago. His smoking use included cigarettes. He has never used smokeless tobacco. He reports that he does not drink alcohol or use drugs.  Allergies:  Allergies  Allergen Reactions  . Acyclovir And Related   . Heparin Other (See Comments)    Excessive bleeding per pt.   . Ibuprofen Other (See Comments)    DUE TO DIALYSIS  . Multivitamin [Centrum] Other (See Comments)    DUE TO DIALYSIS  . Daypro [Oxaprozin] Swelling and Rash    Other  reaction(s): Other (See Comments)  . Tape Rash    Medications: I have reviewed the patient's current medications.   Results for orders placed or performed during the hospital encounter of 11/06/18 (from the past 48 hour(s))  Comprehensive metabolic panel     Status: Abnormal   Collection Time: 11/06/18  7:00 PM  Result Value Ref Range   Sodium 132 (L) 135 - 145 mmol/L   Potassium 4.3 3.5 - 5.1 mmol/L   Chloride 93 (L) 98 - 111 mmol/L   CO2 22 22 - 32 mmol/L   Glucose, Bld 123 (H) 70 - 99 mg/dL   BUN 49 (H) 8 - 23 mg/dL   Creatinine, Ser 8.70 (H) 0.61 - 1.24 mg/dL   Calcium 8.2 (L) 8.9 - 10.3 mg/dL   Total Protein 6.3 (L) 6.5 - 8.1 g/dL   Albumin 3.0 (L) 3.5 - 5.0 g/dL   AST 28 15 - 41 U/L   ALT 27 0 - 44 U/L   Alkaline Phosphatase 45 38 - 126 U/L   Total Bilirubin 1.1 0.3 - 1.2 mg/dL   GFR calc non Af Amer 5 (L) >60 mL/min   GFR calc Af Amer 6 (L) >60 mL/min   Anion gap 17 (H) 5 - 15    Comment: Performed at [facility], [address], [place], [place] [address]  CBC with Differential     Status: Abnormal   Collection Time: 11/06/18  7:00 PM  Result Value Ref Range   WBC 3.3 (L) 4.0 - 10.5 K/uL   RBC 3.03 (L) 4.22 - 5.81 MIL/uL   Hemoglobin 10.5 (L) 13.0 - 17.0 g/dL   HCT 31.8 (L) 39.0 - 52.0 %   MCV 105.0 (H) 80.0 - 100.0 fL   MCH 34.7 (H) 26.0 - 34.0 pg   MCHC 33.0 30.0 - 36.0 g/dL   RDW 12.8 11.5 - 15.5 %   Platelets 79 (L) 150 - 400 K/uL    Comment: Immature Platelet Fraction may be clinically indicated, consider ordering this additional test [id number]    nRBC 0.0 0.0 - 0.2 %   Neutrophils Relative % 41 %   Lymphocytes Relative 55 %   Monocytes Relative 3 %   Eosinophils Relative 1 %   Basophils Relative 0 %   Band Neutrophils 0 %   Metamyelocytes Relative 0 %   Myelocytes 0 %   Promyelocytes Relative 0 %   Blasts 0 %   nRBC 0 0 /100 WBC   Other 0 %   Neutro Abs 1.4 (L) 1.7 - 7.7 K/uL   Lymphs Abs 1.8 0.7 - 4.0 K/uL   Monocytes Absolute 0.1 0.1  - 1.0 K/uL   Eosinophils Absolute 0.0 0.0 - 0.5 K/uL   Basophils Absolute 0.0 0.0 - 0.1 K/uL   WBC Morphology FEW ATYPICAL LYMPHS NOTED     Comment: Performed at [facility], [place] [address]  [place], [place] [address]  Brain natriuretic peptide     Status: Abnormal   Collection Time: 11/06/18 11:47 PM  Result Value Ref Range   B Natriuretic Peptide 326.7 (H) 0.0 - 100.0 pg/mL    Comment: Performed at [facility] [address] [place], [place] [address]  C-reactive protein     Status: Abnormal   Collection Time: 11/06/18 11:47 PM  Result Value Ref Range   CRP 26.7 (H) <1.0 mg/dL    Comment: Performed at [facility] [address], [place], [place] [address]  D-dimer, quantitative (not at [facility])     Status: Abnormal   Collection Time: 11/06/18 11:47 PM  Result Value Ref Range   D-Dimer, Quant 4.50 (H) 0.00 - 0.50 ug/mL-FEU    Comment: (NOTE) At the manufacturer cut-off of 0.50 ug/mL FEU, this assay has been documented to exclude PE with a sensitivity and negative predictive value of 97 to 99%.  At this time, this assay has not been approved by the FDA to exclude DVT/VTE. Results should be correlated with clinical presentation. Performed at [facility], [place] [address], [place], [place] [address]   Ferritin     Status: Abnormal   Collection Time: 11/06/18 11:47 PM  Result Value Ref Range   Ferritin 4,171 (H) 24 - 336 ng/mL    Comment: Performed at [facility], [place] [address], [place], [place] [address]  Fibrinogen     Status: Abnormal   Collection Time: 11/06/18 11:47 PM  Result Value Ref Range   Fibrinogen 654 (H) 210 - 475 mg/dL    Comment: Performed at [facility] [address] [place], [place] [address]  Lactate dehydrogenase     Status: Abnormal   Collection Time: 11/06/18 11:47 PM  Result Value Ref Range   LDH 244 (H) 98 - 192 U/L    Comment: Performed at [facility], [place] [address] [place], [place] [address]   Procalcitonin     Status: None   Collection Time: 11/06/18 11:47 PM  Result Value Ref Range   Procalcitonin 1.25 ng/mL    Comment:        Interpretation: PCT > 0.5 ng/mL and <= 2 ng/mL: Systemic infection (sepsis) is possible, but other conditions are known to elevate PCT as well. (NOTE)       Sepsis PCT Algorithm           Lower Respiratory Tract                                      Infection PCT Algorithm    ----------------------------     ----------------------------         PCT < 0.25 ng/mL                PCT < 0.10 ng/mL         Strongly encourage             Strongly discourage   discontinuation of antibiotics    initiation of antibiotics    ----------------------------     -----------------------------       PCT 0.25 - 0.50 ng/mL            PCT 0.10 - 0.25 ng/mL               OR       >80% decrease in PCT  Discourage initiation of                                            antibiotics      Encourage discontinuation           of antibiotics    ----------------------------     -----------------------------         PCT >= 0.50 ng/mL              PCT 0.26 - 0.50 ng/mL                AND       <80% decrease in PCT             Encourage initiation of                                             antibiotics       Encourage continuation           of antibiotics    ----------------------------     -----------------------------        PCT >= 0.50 ng/mL                  PCT > 0.50 ng/mL               AND         increase in PCT                  Strongly encourage                                      initiation of antibiotics    Strongly encourage escalation           of antibiotics                                     -----------------------------                                           PCT <= 0.25 ng/mL                                                 OR                                        > 80% decrease in PCT                                     Discontinue /  Do not initiate  antibiotics Performed at [facility], [place] [address], [place], [place] [address]   Sedimentation rate     Status: Abnormal   Collection Time: 11/06/18 11:47 PM  Result Value Ref Range   Sed Rate 60 (H) 0 - 16 mm/hr    Comment: Performed at [facility] [address] [place], [place] [address]  MRSA PCR Screening     Status: None   Collection Time: 11/07/18  1:55 AM   Specimen: Nasal Mucosa; Nasopharyngeal  Result Value Ref Range   MRSA by PCR NEGATIVE NEGATIVE    Comment:        The GeneXpert MRSA Assay (FDA approved for NASAL specimens only), is one component of a comprehensive MRSA colonization surveillance program. It is not intended to diagnose MRSA infection nor to guide or monitor treatment for MRSA infections. Performed at [facility], [place] [address] [place], [place] [address]   CBC with Differential/Platelet     Status: Abnormal   Collection Time: 11/07/18  5:00 AM  Result Value Ref Range   WBC 2.6 (L) 4.0 - 10.5 K/uL   RBC 2.89 (L) 4.22 - 5.81 MIL/uL   Hemoglobin 10.3 (L) 13.0 - 17.0 g/dL   HCT 30.4 (L) 39.0 - 52.0 %   MCV 105.2 (H) 80.0 - 100.0 fL   MCH 35.6 (H) 26.0 - 34.0 pg   MCHC 33.9 30.0 - 36.0 g/dL   RDW 12.8 11.5 - 15.5 %   Platelets 80 (L) 150 - 400 K/uL    Comment: REPEATED TO VERIFY Immature Platelet Fraction may be clinically indicated, consider ordering this additional test [id number] CONSISTENT WITH PREVIOUS RESULT    nRBC 0.0 0.0 - 0.2 %   Neutrophils Relative % 43 %   Neutro Abs 1.1 (L) 1.7 - 7.7 K/uL   Lymphocytes Relative 45 %   Lymphs Abs 1.2 0.7 - 4.0 K/uL   Monocytes Relative 11 %   Monocytes Absolute 0.3 0.1 - 1.0 K/uL   Eosinophils Relative 1 %   Eosinophils Absolute 0.0 0.0 - 0.5 K/uL   Basophils Relative 0 %   Basophils Absolute 0.0 0.0 - 0.1 K/uL   Immature Granulocytes 0 %   Abs Immature Granulocytes 0.00 0.00 - 0.07 K/uL    Comment:  Performed at [facility], [place] [address], [place],  [address]  Comprehensive metabolic panel     Status: Abnormal   Collection Time: 11/07/18  5:00 AM  Result Value Ref Range   Sodium 129 (L) 135 - 145 mmol/L   Potassium 4.4 3.5 - 5.1 mmol/L   Chloride 89 (L) 98 - 111 mmol/L   CO2 21 (L) 22 - 32 mmol/L   Glucose, Bld 326 (H) 70 - 99 mg/dL   BUN 56 (H) 8 - 23 mg/dL   Creatinine, Ser 9.51 (H) 0.61 - 1.24 mg/dL   Calcium 7.9 (L) 8.9 - 10.3 mg/dL   Total Protein 6.0 (L) 6.5 - 8.1 g/dL   Albumin 2.8 (L) 3.5 - 5.0 g/dL   AST 28 15 - 41 U/L   ALT 29 0 - 44 U/L   Alkaline Phosphatase 45 38 - 126 U/L   Total Bilirubin 0.9 0.3 - 1.2 mg/dL   GFR calc non Af Amer 5 (L) >60 mL/min   GFR calc Af Amer 6 (L) >60 mL/min   Anion gap 19 (H) 5 - 15    Comment: Performed at [facility], [place] [address]  [place], [place] [address]  C-reactive protein     Status: Abnormal   Collection Time: 11/07/18  5:00 AM  Result Value Ref Range   CRP 24.5 (H) <1.0 mg/dL    Comment: Performed at [facility] [address], [place], [place] [address]  CK     Status: None   Collection Time: 11/07/18  5:00 AM  Result Value Ref Range   Total CK 98 49 - 397 U/L    Comment: Performed at [facility], [place] [address], [place], [place] [address]  D-dimer, quantitative (not at [facility])     Status: Abnormal   Collection Time: 11/07/18  5:00 AM  Result Value Ref Range   D-Dimer, Quant 3.35 (H) 0.00 - 0.50 ug/mL-FEU    Comment: (NOTE) At the manufacturer cut-off of 0.50 ug/mL FEU, this assay has been documented to exclude PE with a sensitivity and negative predictive value of 97 to 99%.  At this time, this assay has not been approved by the FDA to exclude DVT/VTE. Results should be correlated with clinical presentation. Performed at [facility], [place] [address] [place], [place] [address]   Ferritin     Status: Abnormal   Collection Time: 11/07/18  5:00 AM  Result Value Ref Range    Ferritin 4,034 (H) 24 - 336 ng/mL    Comment: Performed at [facility], [place] [address] [place], [place] [address]  Magnesium     Status: None   Collection Time: 11/07/18  5:00 AM  Result Value Ref Range   Magnesium 1.9 1.7 - 2.4 mg/dL    Comment: Performed at [facility], [place] [address], [place], [place] [address]  Phosphorus     Status: Abnormal   Collection Time: 11/07/18  5:00 AM  Result Value Ref Range   Phosphorus 4.9 (H) 2.5 - 4.6 mg/dL    Comment: Performed at [facility] [address], [place], [place] [address]  Hemoglobin A1c     Status: Abnormal   Collection Time: 11/07/18  5:00 AM  Result Value Ref Range   Hgb A1c MFr Bld 6.8 (H) 4.8 - 5.6 %    Comment: (NOTE) Pre diabetes:          5.7%-6.4% Diabetes:              >6.4% Glycemic control for   <7.0% adults with diabetes    Mean Plasma Glucose 148.46 mg/dL    Comment: Performed at [facility] [address], [place], [place] [address]  Glucose, capillary     Status: Abnormal   Collection Time: 11/07/18  7:38 AM  Result Value Ref Range   Glucose-Capillary 207 (H) 70 - 99 mg/dL    Dg Chest Portable 1 View  Result Date: 11/06/2018 CLINICAL DATA:  COVID-19 infection. Shortness of breath. EXAM: PORTABLE CHEST 1 VIEW COMPARISON:  Chest x-ray dated 03/22/2018 and chest CT dated 01/06/2018 FINDINGS: Power port in place, unchanged with the tip just above the cavoatrial junction in good position. The heart size and pulmonary vascularity are normal. Increased mediastinal adenopathy since the prior chest x-ray. New bibasilar linear atelectasis. No focal infiltrates or effusions. No acute bone abnormality. IMPRESSION: 1. Increased mediastinal adenopathy. 2. New bibasilar atelectasis. 3. Lungs otherwise clear. 4.  Aortic Atherosclerosis (ICD10-I70.0). Electronically Signed   By: [name] M.D.   On: 11/06/2018 16:49    ROS: Unable to obtain as patient not personally seen Blood pressure [name])  124/57,  pulse 80, temperature (!) 102.4 F (39.1 C), temperature source Oral, resp. rate (!) 24, height 5\' 11"  (1.803 m), weight 81.6 kg, SpO2 100 %. Not done as patient is on COVID isolation as noted above  Assessment/Plan: 73 year old black male with hypertension, diabetes, lymphoma/leukemia and ESRD who presents with symptomatic COVID 1 symptomatic COVID-per hospitalist with supportive care 2 ESRD: Normally TTS at [facility] currently on the COVID shift.  Our plan is for him to get routine dialysis today in isolation.  He is an add-on treatment so is possible his treatment may get bumped to 8/7 3 Hypertension: Blood pressure seems very well controlled.  He is not on any blood pressure medications and is actually on Midodrine.  Given that his sodium is low will attempt some ultrafiltration with dialysis today even though he is under his dry weight 4. Anemia of ESRD: Hemoglobin greater than 10.  He receives iron at the dialysis unit but noted that his ferritin is greater than 4000.  This will not be continued.  We will give ESA if hemoglobin goes below 10 5. Metabolic Bone Disease: Calcium and phosphorus now at acceptable levels.  I do not see a binder on his medication list.  His home dose of Hectorol will be continued   Louis Meckel 11/07/2018, 11:50 AM

## 2018-11-07 NOTE — Progress Notes (Signed)
Inpatient Diabetes Program Recommendations  AACE/ADA: New Consensus Statement on Inpatient Glycemic Control (2015)  Target Ranges:  Prepandial:   less than 140 mg/dL      Peak postprandial:   less than 180 mg/dL (1-2 hours)      Critically ill patients:  140 - 180 mg/dL   Lab Results  Component Value Date   GLUCAP 207 (H) 11/07/2018   HGBA1C 6.5 (H) 01/14/2018    Review of Glycemic Control Results for Mark [name], Mark [name] (MRN [id number]) as of 11/07/2018 10:18  Ref. Range 11/07/2018 07:38  Glucose-Capillary Latest Ref Range: 70 - 99 mg/dL 207 (H)   Diabetes history: DM 2 Outpatient Diabetes medications: Amaryl 1 mg daily, Tradjenta 5 mg daily Current orders for Inpatient glycemic control:  Tradjenta 5 mg daily  Inpatient Diabetes Program Recommendations:   Please consider adding Novolog sensitive tid with meals and HS.   Thanks  Adah Perl, RN, BC-ADM Inpatient Diabetes Coordinator Pager [phone] (8a-5p)

## 2018-11-07 NOTE — Progress Notes (Signed)
Contacted Dr.Patel about pt's temp of 102.6 and can't have Tylenol for more than 1.5 hours. Dr.Patel is going to write orders to change the dose to 1000mg  po every 6 hours prn 1st dose now

## 2018-11-08 LAB — COMPREHENSIVE METABOLIC PANEL
ALT: 42 U/L (ref 0–44)
AST: 40 U/L (ref 15–41)
Albumin: 2.8 g/dL — ABNORMAL LOW (ref 3.5–5.0)
Alkaline Phosphatase: 37 U/L — ABNORMAL LOW (ref 38–126)
Anion gap: 19 — ABNORMAL HIGH (ref 5–15)
BUN: 52 mg/dL — ABNORMAL HIGH (ref 8–23)
CO2: 21 mmol/L — ABNORMAL LOW (ref 22–32)
Calcium: 8.1 mg/dL — ABNORMAL LOW (ref 8.9–10.3)
Chloride: 91 mmol/L — ABNORMAL LOW (ref 98–111)
Creatinine, Ser: 9.67 mg/dL — ABNORMAL HIGH (ref 0.61–1.24)
GFR calc Af Amer: 6 mL/min — ABNORMAL LOW (ref >60)
GFR calc non Af Amer: 5 mL/min — ABNORMAL LOW (ref >60)
Glucose, Bld: 125 mg/dL — ABNORMAL HIGH (ref 70–99)
Potassium: 4.3 mmol/L (ref 3.5–5.1)
Sodium: 131 mmol/L — ABNORMAL LOW (ref 135–145)
Total Bilirubin: 1.1 mg/dL (ref 0.3–1.2)
Total Protein: 6.1 g/dL — ABNORMAL LOW (ref 6.5–8.1)

## 2018-11-08 LAB — D-DIMER, QUANTITATIVE: D-Dimer, Quant: 6.01 ug/mL-FEU — ABNORMAL HIGH (ref 0.00–0.50)

## 2018-11-08 LAB — CBC WITH DIFFERENTIAL/PLATELET
Abs Immature Granulocytes: 0.03 10*3/uL (ref 0.00–0.07)
Basophils Absolute: 0 10*3/uL (ref 0.0–0.1)
Basophils Relative: 0 %
Eosinophils Absolute: 0 10*3/uL (ref 0.0–0.5)
Eosinophils Relative: 0 %
HCT: 30.8 % — ABNORMAL LOW (ref 39.0–52.0)
Hemoglobin: 10.5 g/dL — ABNORMAL LOW (ref 13.0–17.0)
Immature Granulocytes: 1 %
Lymphocytes Relative: 43 %
Lymphs Abs: 1.1 10*3/uL (ref 0.7–4.0)
MCH: 35 pg — ABNORMAL HIGH (ref 26.0–34.0)
MCHC: 34.1 g/dL (ref 30.0–36.0)
MCV: 102.7 fL — ABNORMAL HIGH (ref 80.0–100.0)
Monocytes Absolute: 0.4 10*3/uL (ref 0.1–1.0)
Monocytes Relative: 14 %
Neutro Abs: 1 10*3/uL — ABNORMAL LOW (ref 1.7–7.7)
Neutrophils Relative %: 42 %
Platelets: 85 10*3/uL — ABNORMAL LOW (ref 150–400)
RBC: 3 MIL/uL — ABNORMAL LOW (ref 4.22–5.81)
RDW: 12.7 % (ref 11.5–15.5)
WBC: 2.5 10*3/uL — ABNORMAL LOW (ref 4.0–10.5)
nRBC: 0 % (ref 0.0–0.2)

## 2018-11-08 LAB — GLUCOSE, CAPILLARY
Glucose-Capillary: 124 mg/dL — ABNORMAL HIGH (ref 70–99)
Glucose-Capillary: 237 mg/dL — ABNORMAL HIGH (ref 70–99)
Glucose-Capillary: 224 mg/dL — ABNORMAL HIGH (ref 70–99)
Glucose-Capillary: 239 mg/dL — ABNORMAL HIGH (ref 70–99)

## 2018-11-08 LAB — C-REACTIVE PROTEIN: CRP: 32.9 mg/dL — ABNORMAL HIGH (ref <1.0)

## 2018-11-08 LAB — HEPATITIS B SURFACE ANTIGEN: Hepatitis B Surface Ag: NEGATIVE

## 2018-11-08 LAB — PROCALCITONIN: Procalcitonin: 2.36 ng/mL

## 2018-11-08 LAB — FERRITIN: Ferritin: 6057 ng/mL — ABNORMAL HIGH (ref 24–336)

## 2018-11-08 LAB — PATHOLOGIST SMEAR REVIEW

## 2018-11-08 MED ORDER — METHYLPREDNISOLONE SODIUM SUCC 40 MG IJ SOLR
40.0000 mg | Freq: Two times a day (BID) | INTRAMUSCULAR | Status: DC
  Administered 2018-11-08: 40 mg via INTRAVENOUS
  Filled 2018-11-08: qty 1

## 2018-11-08 MED ORDER — METHYLPREDNISOLONE SODIUM SUCC 125 MG IJ SOLR
60.0000 mg | Freq: Two times a day (BID) | INTRAMUSCULAR | Status: DC
  Administered 2018-11-08 – 2018-11-10 (×5): 60 mg via INTRAVENOUS
  Filled 2018-11-08 (×5): qty 2

## 2018-11-08 MED ORDER — CHLORHEXIDINE GLUCONATE CLOTH 2 % EX PADS
6.0000 | MEDICATED_PAD | Freq: Every day | CUTANEOUS | Status: DC
  Administered 2018-11-08 – 2018-11-10 (×3): 6 via TOPICAL

## 2018-11-08 MED ORDER — LOPERAMIDE HCL 2 MG PO CAPS
4.0000 mg | ORAL_CAPSULE | Freq: Once | ORAL | Status: AC
  Administered 2018-11-08: 4 mg via ORAL
  Filled 2018-11-08: qty 2

## 2018-11-08 NOTE — Evaluation (Signed)
Physical Therapy Evaluation Patient Details Name: Mark [name]. MRN: [id number] DOB: [birth year] Today's Date: 11/08/2018   History of Present Illness  73/M w stage IV SLL/CLL, ESRD on TThS HD, type 2 diabetes, hypertension, hyperlipidemia, history of TIA, anemia of chronic disease, thrombocytopenia and recent positive test for COVID-19 on 10/25/2018 admitted with several days of shortness of breath with dyspnea on minimal exertion, fevers,, nonproductive cough with chest congestion, and poor oral intake due to low appetite.  Clinical Impression  Patient presents with decreased mobility due to deficits listed in PT problem list including decreased strength, balance and activity tolerance.  Feel he will benefit from skilled PT in the acute setting to maximize mobility and allow d/c home with family support and follow up HHPT.  Encouraged ambulation in room with staff over the weekend as well, (he needs to don diabetic shoes and R in shoe articulating AFO.)    Follow Up Recommendations Home health PT;Supervision/Assistance - 24 hour    Equipment Recommendations  None recommended by PT    Recommendations for Other Services       Precautions / Restrictions Precautions Precautions: Fall Required Braces or Orthoses: Other Brace Other Brace: R in shoe articulating AFO      Mobility  Bed Mobility Overal bed mobility: Needs Assistance Bed Mobility: Supine to Sit     Supine to sit: Min assist     General bed mobility comments: pulled up to sit with rail and one hand assist  Transfers Overall transfer level: Needs assistance Equipment used: Rolling walker (2 wheeled) Transfers: Sit to/from [facility] Sit to Stand: Min guard;Supervision Stand pivot transfers: Supervision       General transfer comment: use of UE support for sit to stand and increased time, some imbalance reaching from bed to walker during transition but without true  LOB  Ambulation/Gait Ambulation/Gait assistance: Min guard Gait Distance (Feet): 30 Feet(& 15') Assistive device: Rolling walker (2 wheeled)(& R AFO) Gait Pattern/deviations: Step-to pattern;Step-through pattern;Trunk flexed;Decreased stride length     General Gait Details: reliant on RW for ambulation and increased time needed.  Sat after 15' to blow his nose, then ambulation length of O2 tubing twice prior to fatigue.  Stairs            Wheelchair Mobility    Modified Rankin (Stroke Patients Only)       Balance Overall balance assessment: Needs assistance   Sitting balance-Leahy Scale: Good Sitting balance - Comments: leans over to fasten AFO, but could not get shoe due to feeling light headed bending over   Standing balance support: Bilateral upper extremity supported Standing balance-Leahy Scale: Poor Standing balance comment: relaint on UE support for balance                             Pertinent Vitals/Pain Pain Assessment: No/denies pain    Home Living Family/patient expects to be discharged to:: Private residence Living Arrangements: Spouse/significant other;Children;Other relatives Available Help at Discharge: Family Type of Home: House Home Access: Ramped entrance     Home Layout: One level Home Equipment: [place] - 4 wheels;Wheelchair - Banker;Shower seat;Grab bars - tub/shower;Grab bars - toilet;Hand held shower head;Cane - quad;Cane - single point;Walker - 2 wheels;Bedside commode      Prior Function Level of Independence: Independent with assistive device(s)         Comments: normally takes care of himself, but wife having to help due to diarrhea  at home, and pt reports she is limited in ability to help due to her own back issues     Hand Dominance        Extremity/Trunk Assessment   Upper Extremity Assessment Upper Extremity Assessment: Generalized weakness(fistula L arm and port R chest)    Lower  Extremity Assessment Lower Extremity Assessment: RLE deficits/detail;LLE deficits/detail RLE Deficits / Details: AROM grossly WFL,. strength 4- to 4/5 RLE Sensation: history of peripheral neuropathy;decreased light touch LLE Deficits / Details: history of foot drop, otherwise strength grossly 4 to 4-/5 LLE Sensation: history of peripheral neuropathy;decreased light touch       Communication   Communication: No difficulties  Cognition Arousal/Alertness: Awake/alert Behavior During Therapy: WFL for tasks assessed/performed Overall Cognitive Status: History of cognitive impairments - at baseline                                 General Comments: admits to [facility] deficits      General Comments General comments (skin integrity, edema, etc.): SpO2 94% with ambulation on 2L O2 in room, some SOB reported    Exercises     Assessment/Plan    PT Assessment Patient needs continued PT services  PT Problem List Decreased strength;Decreased balance;Decreased mobility;Cardiopulmonary status limiting activity       PT Treatment Interventions DME instruction;Gait training;Functional mobility training;Therapeutic exercise;Patient/family education;Balance training    PT Goals (Current goals can be found in the Care Plan section)  Acute Rehab PT Goals Patient Stated Goal: To go home PT Goal Formulation: With patient Time For Goal Achievement: 11/22/18 Potential to Achieve Goals: Good    Frequency Min 3X/week   Barriers to discharge        Co-evaluation               AM-PAC PT "6 Clicks" Mobility  Outcome Measure Help needed turning from your back to your side while in a flat bed without using bedrails?: A Little Help needed moving from lying on your back to sitting on the side of a flat bed without using bedrails?: A Little Help needed moving to and from a bed to a chair (including a wheelchair)?: A Little Help needed standing up from a chair using your arms (e.g.,  wheelchair or bedside chair)?: A Little Help needed to walk in hospital room?: A Little Help needed climbing 3-5 steps with a railing? : A Little 6 Click Score: 18    End of Session Equipment Utilized During Treatment: Gait belt;Oxygen Activity Tolerance: Patient tolerated treatment well Patient left: with call bell/phone within reach;in bed(sitting EOB and set up lunch tray)   PT Visit Diagnosis: Other abnormalities of gait and mobility (R26.89);Muscle weakness (generalized) (M62.81)    Time: 4163-8453 PT Time Calculation (min) (ACUTE ONLY): 34 min   Charges:   PT Evaluation $PT Eval Moderate Complexity: 1 Mod PT Treatments $Gait Training: 8-22 mins        Magda Kiel, PT Acute Rehabilitation Services [phone] 11/08/2018   Reginia Naas 11/08/2018, 3:12 PM

## 2018-11-08 NOTE — Progress Notes (Signed)
Patient will resume OP HD treatment at [facility] in [place] at discharge on a TTS schedule with a chair time of 12:00pm. Patient had already been on this schedule prior to this hospitalization. Schedule confirmed with [facility] staff by Renal Navigator.  Alphonzo Cruise, [place] Renal Navigator [phone]

## 2018-11-08 NOTE — Progress Notes (Signed)
Pt's temp around midnight after receiving 1000 mg of Tylenol at 2110 was 99.8. Now at 0410 pt's temp is 103.1.Pt's temp not being controlled with Tylenol. Tylenol is bringing temp down but only for a few hours.

## 2018-11-08 NOTE — Progress Notes (Signed)
Subjective:  HD yest- removed 1800- tolerated well.  Febrile overnight  Objective Vital signs in last 24 hours: Vitals:   11/08/18 0035 11/08/18 0410 11/08/18 0520 11/08/18 0600  BP: (!) 130/53     Pulse: 84     Resp: 18     Temp: 99.8 F (37.7 C) (!) 103.1 F (39.5 C) (!) 100.8 F (38.2 C)   TempSrc: Oral Oral Oral   SpO2: 98%     Weight:    81.3 kg  Height:       Weight change: 0.8 kg  Intake/Output Summary (Last 24 hours) at 11/08/2018 0830 Last data filed at 11/07/2018 1545 Gross per 24 hour  Intake -  Output 1800 ml  Net -1800 ml    Dialyzes at [facility].  TTS 3-1/2 hours HD Bath 2K/3 calcium, Dialyzer unknown, Heparin no. Access left aVF.  400/800 Hectorol 0.5 mcg q. treatment and Venofer 50 weekly  Assessment/Plan: 73 year old black male with hypertension, diabetes, lymphoma/leukemia and ESRD who presents with symptomatic COVID 1 symptomatic COVID-per hospitalist with supportive care 2 ESRD: Normally TTS at [facility] currently on the COVID shift.  Our plan is for him to get routine dialysis in isolation- next planned for tomorrow via AVF.   3 Hypertension: Blood pressure seems very well controlled.  He is not on any blood pressure medications and is actually on Midodrine.  Given that his sodium is low will attempt some ultrafiltration with dialysis even though he is under his dry weight 4. Anemia of ESRD: Hemoglobin greater than 10.  He receives iron at the dialysis unit but noted that his ferritin is greater than 4000 so will not give.   We will give ESA if hemoglobin goes below 10 5. Metabolic Bone Disease: Calcium and phosphorus now at acceptable levels.  I do not see a binder on his medication list.  His home dose of Hectorol will be continued   [place]: Basic Metabolic Panel: Recent Labs  Lab 11/06/18 1900 11/07/18 0500 11/08/18 0520  NA 132* 129* 131*  K 4.3 4.4 4.3  CL 93* 89* 91*  CO2 22 21* 21*  GLUCOSE 123*  326* 125*  BUN 49* 56* 52*  CREATININE 8.70* 9.51* 9.67*  CALCIUM 8.2* 7.9* 8.1*  PHOS  --  4.9*  --    Liver Function Tests: Recent Labs  Lab 11/06/18 1900 11/07/18 0500 11/08/18 0520  AST 28 28 40  ALT 27 29 42  ALKPHOS 45 45 37*  BILITOT 1.1 0.9 1.1  PROT 6.3* 6.0* 6.1*  ALBUMIN 3.0* 2.8* 2.8*   No results for input(s): LIPASE, AMYLASE in the last 168 hours. No results for input(s): AMMONIA in the last 168 hours. CBC: Recent Labs  Lab 11/06/18 1900 11/07/18 0500 11/08/18 0520  WBC 3.3* 2.6* 2.5*  NEUTROABS 1.4* 1.1* PENDING  HGB 10.5* 10.3* 10.5*  HCT 31.8* 30.4* 30.8*  MCV 105.0* 105.2* 102.7*  PLT 79* 80* 85*   Cardiac Enzymes: Recent Labs  Lab 11/07/18 0500  CKTOTAL 98   CBG: Recent Labs  Lab 11/07/18 0738 11/07/18 1241 11/07/18 1756 11/07/18 2042  GLUCAP 207* 219* 140* 161*    Iron Studies:  Recent Labs    11/08/18 0520  FERRITIN 6,057*   Studies/Results: Dg Chest Port 1 View  Result Date: 11/07/2018 CLINICAL DATA:  COVID-19 patient EXAM: PORTABLE CHEST 1 VIEW COMPARISON:  11/06/2018, 03/22/2018 FINDINGS: Right-sided central venous port tip over the cavoatrial region.  Stable heart size. No change in basilar airspace disease. No new foci of airspace disease. Bilateral hilar opacities, possible nodes. Slightly broadened appearance of the upper mediastinum. Aortic atherosclerosis. No pneumothorax. IMPRESSION: 1. Overall no significant change in bibasilar airspace disease as compared with 11/06/2018 2. Mildly prominent hilar opacities and borderline widening of the upper mediastinum, probable nodes. Electronically Signed   By: Donavan Foil M.D.   On: 11/07/2018 17:48   Dg Chest Portable 1 View  Result Date: 11/06/2018 CLINICAL DATA:  COVID-19 infection. Shortness of breath. EXAM: PORTABLE CHEST 1 VIEW COMPARISON:  Chest x-ray dated 03/22/2018 and chest CT dated 01/06/2018 FINDINGS: Power port in place, unchanged with the tip just above the cavoatrial  junction in good position. The heart size and pulmonary vascularity are normal. Increased mediastinal adenopathy since the prior chest x-ray. New bibasilar linear atelectasis. No focal infiltrates or effusions. No acute bone abnormality. IMPRESSION: 1. Increased mediastinal adenopathy. 2. New bibasilar atelectasis. 3. Lungs otherwise clear. 4.  Aortic Atherosclerosis (ICD10-I70.0). Electronically Signed   By: [name] M.D.   On: 11/06/2018 16:49   Medications: Infusions: . sodium chloride    . sodium chloride      Scheduled Medications: . albuterol  2 puff Inhalation Q6H  . Chlorhexidine Gluconate Cloth  6 each Topical Q0600  . doxercalciferol  0.5 mcg Oral Q T,Th,Sa-HD  . feeding supplement (ENSURE ENLIVE)  237 mL Oral BID BM  . gabapentin  100 mg Oral QHS  . insulin aspart  0-9 Units Subcutaneous TID WC  . insulin glargine  10 Units Subcutaneous Daily  . linagliptin  5 mg Oral Daily  . loratadine  10 mg Oral Daily  . mouth rinse  15 mL Mouth Rinse BID  . Melatonin  6 mg Oral QHS  . methylPREDNISolone (SOLU-MEDROL) injection  40 mg Intravenous Q12H  . midodrine  10 mg Oral Q T,Th,Sa-HD  . multivitamin  1 tablet Oral QHS  . pravastatin  20 mg Oral q1800  . vitamin C  500 mg Oral Daily  . zinc sulfate  220 mg Oral Daily    have reviewed scheduled and prn medications.  Physical Exam: PE not done as pt is on COVID isolation in order to preserve PPE and to avoid exposure to other providers.  Particulars of exam discussed with primary MD.  Place of service is [place]  11/08/2018,8:30 AM  LOS: 2 days

## 2018-11-08 NOTE — Progress Notes (Signed)
Inpatient Diabetes Program Recommendations  AACE/ADA: New Consensus Statement on Inpatient Glycemic Control (2015)  Target Ranges:  Prepandial:   less than 140 mg/dL      Peak postprandial:   less than 180 mg/dL (1-2 hours)      Critically ill patients:  140 - 180 mg/dL   Lab Results  Component Value Date   GLUCAP 161 (H) 11/07/2018   HGBA1C 6.8 (H) 11/07/2018    Diabetes history: DM 2 Outpatient Diabetes medications: Amaryl 1 mg daily, Tradjenta 5 mg daily Current orders for Inpatient glycemic control:  Tradjenta 5 mg daily Lantus 10 units Novolog 0-9 units tid  Novolog 0-5 units qhs  Inpatient Diabetes Program Recommendations:     Noted Solumedrol 40 mg q12 hours started this am.   If glucose trends increase consider BID dosing of Lantus.  Thanks  Tama Headings RN, MSN, BC-ADM Inpatient Diabetes Coordinator Team Pager [phone] (8a-5p)

## 2018-11-08 NOTE — Progress Notes (Addendum)
PROGRESS NOTE    [employer].  EHM:[id number] DOB: 12/24/1945 DOA: 11/06/2018 PCP: Albina Billet, MD  Brief Narrative:73/M w stage IV SLL/CLL, ESRD on TThS HD, type 2 diabetes, hypertension, hyperlipidemia, history of TIA, anemia of chronic disease, thrombocytopenia and recent positive test for COVID-19 on 10/25/2018 who presents to the ED for evaluation of several days of shortness of breath with dyspnea on minimal exertion, fevers,, nonproductive cough with chest congestion, and poor oral intake due to low appetite.  He also developed diarrhea morning of admission.  Patient states he was asymptomatic on 10/25/2018 at time of initial test.  He is on chronic O2 at night due to mild-moderate restrictive lung disease however has had to use oxygen during the day until 2days ago, temp was 101, presented to ED and admitted   Assessment & Plan:   COVID-19 viral infection: Associated with recent symptoms of fevers, dyspnea, nonproductive cough, poor appetite/p.o. intake, and diarrhea.  Chest x-ray without groundglass opacities/evidence of pneumonia, basilar atelectasis noted.   -SARS COVID PCR test was positive on 7/24 which is 12 days ago  -Repeat chest x-ray 8/7 with bibasilar airspace disease versus atelectasis unchanged from prior  -Was febrile most of the day yesterday, now with diarrhea -Oxygen requirement remained stable, he does use 2 L at night and with activity at baseline which is what he is on now -Inflammatory markers considerably abnormal, there may be some overlap of symptoms with his advanced lymphoma -Started on low-dose IV Solu-Medrol yesterday which I will continue, -Monitor CRP, ferritin, d-dimer and trend procalcitonin -Continue albuterol MDI, incentive spirometer, flutter valve -Off oxygen during the daytime -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  Follows with oncology, Dr. Rogue Bussing in [facility]  Pancytopenia: WBC and hemoglobin slightly decreased from prior but otherwise stable.   -Due to combination of CKD and CLL   Type 2 diabetes: Continue Home linagliptin -CBGs uncontrolled, add low-dose Lantus, sliding scale and for follow-up hemoglobin A1c  Hypertension: Not currently on antihypertensives.  BP control with HD.  Hyperlipidemia: Continue statin.  DVT prophylaxis: SCDs, due to low platelets Code Status: Partial code, DNI  Family Communication:  No family at bedside will update spouse Disposition Plan:  Home in 24 to 48 hours pending stability  Consultants:   Renal   Procedures:   Antimicrobials:    Subjective: -Feels quite weak, denies any cough congestion or dyspnea, started having diarrhea last night, was febrile most of the day yesterday  Objective: Vitals:   11/08/18 0410 11/08/18 0520 11/08/18 0600 11/08/18 0841  BP:    (!) 132/46  Pulse:    74  Resp:    17  Temp: (!) 103.1 F (39.5 C) (!) 100.8 F (38.2 C)  98.6 F (37 C)  TempSrc: Oral Oral  Oral  SpO2:      Weight:   81.3 kg   Height:        Intake/Output Summary (Last 24 hours) at 11/08/2018 1442 Last data filed at 11/08/2018 3662 Gross per 24 hour  Intake 120 ml  Output 1800 ml  Net -1680 ml   Filed Weights   11/07/18 1240 11/07/18 1545 11/08/18 0600  Weight: 82.4 kg 80.6 kg 81.3 kg    Examination:  Gen: Elderly frail gentleman laying in bed, AAO x3, no distress HEENT:  PERRLA, Neck supple, no JVD Lungs: Decreased breath sounds at both bases CVS: S1-S2/regular rate rhythm Abd: Soft, nondistended, nontender, bowel sounds present  extremities: No edema, left arm AV fistula Skin: no new rashes Psychiatry: Mood & affect appropriate.     Data Reviewed:   CBC: Recent Labs  Lab 11/06/18 1900  11/07/18 0500 11/08/18 0520  WBC 3.3* 2.6* 2.5*  NEUTROABS 1.4* 1.1* 1.0*  HGB 10.5* 10.3* 10.5*  HCT 31.8* 30.4* 30.8*  MCV 105.0* 105.2* 102.7*  PLT 79* 80* 85*   Basic Metabolic Panel: Recent Labs  Lab 11/06/18 1900 11/07/18 0500 11/08/18 0520  NA 132* 129* 131*  K 4.3 4.4 4.3  CL 93* 89* 91*  CO2 22 21* 21*  GLUCOSE 123* 326* 125*  BUN 49* 56* 52*  CREATININE 8.70* 9.51* 9.67*  CALCIUM 8.2* 7.9* 8.1*  MG  --  1.9  --   PHOS  --  4.9*  --    GFR: Estimated Creatinine Clearance: 7.2 mL/min (A) (by C-G formula based on SCr of 9.67 mg/dL (H)). Liver Function Tests: Recent Labs  Lab 11/06/18 1900 11/07/18 0500 11/08/18 0520  AST 28 28 40  ALT 27 29 42  ALKPHOS 45 45 37*  BILITOT 1.1 0.9 1.1  PROT 6.3* 6.0* 6.1*  ALBUMIN 3.0* 2.8* 2.8*   No results for input(s): LIPASE, AMYLASE in the last 168 hours. No results for input(s): AMMONIA in the last 168 hours. Coagulation Profile: No results for input(s): INR, PROTIME in the last 168 hours. Cardiac Enzymes: Recent Labs  Lab 11/07/18 0500  CKTOTAL 98   BNP (last 3 results) No results for input(s): PROBNP in the last 8760 hours. HbA1C: Recent Labs    11/07/18 0500  HGBA1C 6.8*   CBG: Recent Labs  Lab 11/07/18 1241 11/07/18 1756 11/07/18 2042 11/08/18 0833 11/08/18 1311  GLUCAP 219* 140* 161* 124* 237*   Lipid Profile: No results for input(s): CHOL, HDL, LDLCALC, TRIG, CHOLHDL, LDLDIRECT in the last 72 hours. Thyroid Function Tests: No results for input(s): TSH, T4TOTAL, FREET4, T3FREE, THYROIDAB in the last 72 hours. Anemia Panel: Recent Labs    11/07/18 0500 11/08/18 0520  FERRITIN 4,034* 6,057*   Urine analysis:    Component Value Date/Time   COLORURINE YELLOW 01/12/2018 0129   APPEARANCEUR CLOUDY (A) 01/12/2018 0129   APPEARANCEUR Clear 01/06/2016 1433   LABSPEC 1.014 01/12/2018 0129   LABSPEC 1.014 10/20/2013 1835   PHURINE 5.0 01/12/2018 0129   GLUCOSEU 50 (A) 01/12/2018 0129    GLUCOSEU 150 mg/dL 10/20/2013 1835   HGBUR MODERATE (A) 01/12/2018 0129   BILIRUBINUR NEGATIVE 01/12/2018 0129   BILIRUBINUR Negative 01/06/2016 1433   BILIRUBINUR Negative 10/20/2013 1835   KETONESUR NEGATIVE 01/12/2018 0129   PROTEINUR >=300 (A) 01/12/2018 0129   NITRITE NEGATIVE 01/12/2018 0129   LEUKOCYTESUR NEGATIVE 01/12/2018 0129   LEUKOCYTESUR Negative 01/06/2016 1433   LEUKOCYTESUR 1+ 10/20/2013 1835   Sepsis Labs: @LABRCNTIP (procalcitonin:4,lacticidven:4)  ) Recent Results (from the past 240 hour(s))  MRSA PCR Screening     Status: None   Collection Time: 11/07/18  1:55 AM   Specimen: Nasal Mucosa; Nasopharyngeal  Result Value Ref Range Status   MRSA by PCR NEGATIVE NEGATIVE Final    Comment:        The GeneXpert MRSA Assay (FDA approved for NASAL specimens only), is one component of a comprehensive MRSA colonization surveillance program. It is not intended to diagnose MRSA infection nor to guide or monitor treatment for MRSA infections. Performed at  [facility], [place] [address], [place], [place] [address]          Radiology Studies: Dg Chest Port 1 View  Result Date: 11/07/2018 CLINICAL DATA:  COVID-19 patient EXAM: PORTABLE CHEST 1 VIEW COMPARISON:  11/06/2018, 03/22/2018 FINDINGS: Right-sided central venous port tip over the cavoatrial region. Stable heart size. No change in basilar airspace disease. No new foci of airspace disease. Bilateral hilar opacities, possible nodes. Slightly broadened appearance of the upper mediastinum. Aortic atherosclerosis. No pneumothorax. IMPRESSION: 1. Overall no significant change in bibasilar airspace disease as compared with 11/06/2018 2. Mildly prominent hilar opacities and borderline widening of the upper mediastinum, probable nodes. Electronically Signed   By: Donavan Foil M.D.   On: 11/07/2018 17:48   Dg Chest Portable 1 View  Result Date: 11/06/2018 CLINICAL DATA:  COVID-19 infection. Shortness of breath. EXAM:  PORTABLE CHEST 1 VIEW COMPARISON:  Chest x-ray dated 03/22/2018 and chest CT dated 01/06/2018 FINDINGS: Power port in place, unchanged with the tip just above the cavoatrial junction in good position. The heart size and pulmonary vascularity are normal. Increased mediastinal adenopathy since the prior chest x-ray. New bibasilar linear atelectasis. No focal infiltrates or effusions. No acute bone abnormality. IMPRESSION: 1. Increased mediastinal adenopathy. 2. New bibasilar atelectasis. 3. Lungs otherwise clear. 4.  Aortic Atherosclerosis (ICD10-I70.0). Electronically Signed   By: [name] M.D.   On: 11/06/2018 16:49        Scheduled Meds: . albuterol  2 puff Inhalation Q6H  . Chlorhexidine Gluconate Cloth  6 each Topical Q0600  . Chlorhexidine Gluconate Cloth  6 each Topical Q0600  . doxercalciferol  0.5 mcg Oral Q T,Th,Sa-HD  . feeding supplement (ENSURE ENLIVE)  237 mL Oral BID BM  . gabapentin  100 mg Oral QHS  . insulin aspart  0-9 Units Subcutaneous TID WC  . insulin glargine  10 Units Subcutaneous Daily  . linagliptin  5 mg Oral Daily  . loratadine  10 mg Oral Daily  . mouth rinse  15 mL Mouth Rinse BID  . Melatonin  6 mg Oral QHS  . methylPREDNISolone (SOLU-MEDROL) injection  60 mg Intravenous BID  . midodrine  10 mg Oral Q T,Th,Sa-HD  . multivitamin  1 tablet Oral QHS  . pravastatin  20 mg Oral q1800  . vitamin C  500 mg Oral Daily  . zinc sulfate  220 mg Oral Daily   Continuous Infusions: . sodium chloride    . sodium chloride       LOS: 2 days    Time spent: 26min    Domenic Polite, MD [facility]   11/08/2018, 2:42 PM

## 2018-11-08 NOTE — Progress Notes (Signed)
Spoke with Dr.Kakrakandy about unable to control temps with Tylenol and pt hasn't had bld cultures done this adm and he's had temps the entire adm. Dr.Kakrakandy to talk with primary doctor about adding SoluMedrol to pt's med list.

## 2018-11-09 LAB — CBC WITH DIFFERENTIAL/PLATELET
Abs Immature Granulocytes: 0.03 10*3/uL (ref 0.00–0.07)
Basophils Absolute: 0 10*3/uL (ref 0.0–0.1)
Basophils Relative: 0 %
Eosinophils Absolute: 0 10*3/uL (ref 0.0–0.5)
Eosinophils Relative: 0 %
HCT: 31.3 % — ABNORMAL LOW (ref 39.0–52.0)
Hemoglobin: 10.5 g/dL — ABNORMAL LOW (ref 13.0–17.0)
Immature Granulocytes: 1 %
Lymphocytes Relative: 56 %
Lymphs Abs: 1.2 10*3/uL (ref 0.7–4.0)
MCH: 34.3 pg — ABNORMAL HIGH (ref 26.0–34.0)
MCHC: 33.5 g/dL (ref 30.0–36.0)
MCV: 102.3 fL — ABNORMAL HIGH (ref 80.0–100.0)
Monocytes Absolute: 0.1 10*3/uL (ref 0.1–1.0)
Monocytes Relative: 4 %
Neutro Abs: 0.8 10*3/uL — ABNORMAL LOW (ref 1.7–7.7)
Neutrophils Relative %: 39 %
Platelets: 97 10*3/uL — ABNORMAL LOW (ref 150–400)
RBC: 3.06 MIL/uL — ABNORMAL LOW (ref 4.22–5.81)
RDW: 12.6 % (ref 11.5–15.5)
WBC: 2.2 10*3/uL — ABNORMAL LOW (ref 4.0–10.5)
nRBC: 0 % (ref 0.0–0.2)

## 2018-11-09 LAB — COMPREHENSIVE METABOLIC PANEL
ALT: 42 U/L (ref 0–44)
AST: 31 U/L (ref 15–41)
Albumin: 2.6 g/dL — ABNORMAL LOW (ref 3.5–5.0)
Alkaline Phosphatase: 44 U/L (ref 38–126)
Anion gap: 21 — ABNORMAL HIGH (ref 5–15)
BUN: 85 mg/dL — ABNORMAL HIGH (ref 8–23)
CO2: 19 mmol/L — ABNORMAL LOW (ref 22–32)
Calcium: 8 mg/dL — ABNORMAL LOW (ref 8.9–10.3)
Chloride: 88 mmol/L — ABNORMAL LOW (ref 98–111)
Creatinine, Ser: 11.02 mg/dL — ABNORMAL HIGH (ref 0.61–1.24)
GFR calc Af Amer: 5 mL/min — ABNORMAL LOW (ref >60)
GFR calc non Af Amer: 4 mL/min — ABNORMAL LOW (ref >60)
Glucose, Bld: 260 mg/dL — ABNORMAL HIGH (ref 70–99)
Potassium: 5.5 mmol/L — ABNORMAL HIGH (ref 3.5–5.1)
Sodium: 128 mmol/L — ABNORMAL LOW (ref 135–145)
Total Bilirubin: 0.7 mg/dL (ref 0.3–1.2)
Total Protein: 6.1 g/dL — ABNORMAL LOW (ref 6.5–8.1)

## 2018-11-09 LAB — GLUCOSE, CAPILLARY
Glucose-Capillary: 275 mg/dL — ABNORMAL HIGH (ref 70–99)
Glucose-Capillary: 121 mg/dL — ABNORMAL HIGH (ref 70–99)
Glucose-Capillary: 355 mg/dL — ABNORMAL HIGH (ref 70–99)
Glucose-Capillary: 302 mg/dL — ABNORMAL HIGH (ref 70–99)

## 2018-11-09 LAB — C-REACTIVE PROTEIN: CRP: 28.4 mg/dL — ABNORMAL HIGH (ref <1.0)

## 2018-11-09 LAB — PROCALCITONIN: Procalcitonin: 2.37 ng/mL

## 2018-11-09 LAB — D-DIMER, QUANTITATIVE: D-Dimer, Quant: 4.46 ug/mL-FEU — ABNORMAL HIGH (ref 0.00–0.50)

## 2018-11-09 LAB — FERRITIN: Ferritin: 6824 ng/mL — ABNORMAL HIGH (ref 24–336)

## 2018-11-09 MED ORDER — INSULIN GLARGINE 100 UNIT/ML ~~LOC~~ SOLN
20.0000 [IU] | Freq: Every day | SUBCUTANEOUS | Status: DC
  Administered 2018-11-10: 20 [IU] via SUBCUTANEOUS
  Filled 2018-11-09 (×2): qty 0.2

## 2018-11-09 MED ORDER — CALCIUM CARBONATE ANTACID 500 MG PO CHEW
1.0000 | CHEWABLE_TABLET | Freq: Three times a day (TID) | ORAL | Status: DC | PRN
  Administered 2018-11-09: 200 mg via ORAL
  Filled 2018-11-09: qty 1

## 2018-11-09 MED ORDER — PANTOPRAZOLE SODIUM 40 MG PO TBEC
40.0000 mg | DELAYED_RELEASE_TABLET | Freq: Every day | ORAL | Status: DC
  Administered 2018-11-09 – 2018-11-10 (×2): 40 mg via ORAL
  Filled 2018-11-09 (×2): qty 1

## 2018-11-09 MED ORDER — INSULIN GLARGINE 100 UNIT/ML ~~LOC~~ SOLN
10.0000 [IU] | Freq: Once | SUBCUTANEOUS | Status: AC
  Administered 2018-11-09: 10 [IU] via SUBCUTANEOUS
  Filled 2018-11-09: qty 0.1

## 2018-11-09 NOTE — Progress Notes (Signed)
Subjective:  Fever subsiding.  Due for HD today  Objective Vital signs in last 24 hours: Vitals:   11/08/18 1935 11/08/18 2303 11/09/18 0420 11/09/18 0430  BP: 140/61 (!) 125/57    Pulse: 75 66    Resp: 18 20    Temp: 98.3 F (36.8 C) 97.7 F (36.5 C)  98.6 F (37 C)  TempSrc: Oral Oral  Oral  SpO2: 99% 100%    Weight:   82.4 kg   Height:       Weight change: 0 kg  Intake/Output Summary (Last 24 hours) at 11/09/2018 0719 Last data filed at 11/08/2018 1700 Gross per 24 hour  Intake 660 ml  Output 0 ml  Net 660 ml    Dialyzes at [facility].  TTS 3-1/2 hours HD Bath 2K/3 calcium, Dialyzer unknown, Heparin no. Access left aVF.  400/800 Hectorol 0.5 mcg q. treatment and Venofer 50 weekly  Assessment/Plan: 73 year old black male with hypertension, diabetes, lymphoma/leukemia and ESRD who presents with symptomatic COVID 1 symptomatic COVID-per hospitalist with supportive care- seems to be improving 2 ESRD: Normally TTS at [facility] currently on the COVID shift.  Our plan is for him to get routine dialysis in isolation- next planned for today via AVF.   3 Hypertension: Blood pressure seems very well controlled.  He is not on any blood pressure medications and is actually on Midodrine.  Given that his sodium is low will attempt some ultrafiltration with dialysis even though he is under his dry weight 4. Anemia of ESRD: Hemoglobin greater than 10.  He receives iron at the dialysis unit but noted that his ferritin is greater than 4000 so will not give.   We will give ESA if hemoglobin goes below 10 5. Metabolic Bone Disease: Calcium and phosphorus now at acceptable levels.  I do not see a binder on his medication list.  His home dose of Hectorol will be continued   [facility]    Labs: Basic Metabolic Panel: Recent Labs  Lab 11/07/18 0500 11/08/18 0520 11/09/18 0420  NA 129* 131* 128*  K 4.4 4.3 5.5*  CL 89* 91* 88*  CO2 21* 21* 19*  GLUCOSE 326*  125* 260*  BUN 56* 52* 85*  CREATININE 9.51* 9.67* 11.02*  CALCIUM 7.9* 8.1* 8.0*  PHOS 4.9*  --   --    Liver Function Tests: Recent Labs  Lab 11/07/18 0500 11/08/18 0520 11/09/18 0420  AST 28 40 31  ALT 29 42 42  ALKPHOS 45 37* 44  BILITOT 0.9 1.1 0.7  PROT 6.0* 6.1* 6.1*  ALBUMIN 2.8* 2.8* 2.6*   No results for input(s): LIPASE, AMYLASE in the last 168 hours. No results for input(s): AMMONIA in the last 168 hours. CBC: Recent Labs  Lab 11/06/18 1900 11/07/18 0500 11/08/18 0520 11/09/18 0420  WBC 3.3* 2.6* 2.5* 2.2*  NEUTROABS 1.4* 1.1* 1.0* 0.8*  HGB 10.5* 10.3* 10.5* 10.5*  HCT 31.8* 30.4* 30.8* 31.3*  MCV 105.0* 105.2* 102.7* 102.3*  PLT 79* 80* 85* 97*   Cardiac Enzymes: Recent Labs  Lab 11/07/18 0500  CKTOTAL 98   CBG: Recent Labs  Lab 11/07/18 2042 11/08/18 0833 11/08/18 1311 11/08/18 1808 11/08/18 2202  GLUCAP 161* 124* 237* 224* 239*    Iron Studies:  Recent Labs    11/09/18 0420  FERRITIN 6,824*   Studies/Results: Dg Chest Port 1 View  Result Date: 11/07/2018 CLINICAL DATA:  COVID-19 patient EXAM: PORTABLE CHEST 1 VIEW COMPARISON:  11/06/2018, 03/22/2018 FINDINGS: Right-sided central venous port tip over the cavoatrial region. Stable heart size. No change in basilar airspace disease. No new foci of airspace disease. Bilateral hilar opacities, possible nodes. Slightly broadened appearance of the upper mediastinum. Aortic atherosclerosis. No pneumothorax. IMPRESSION: 1. Overall no significant change in bibasilar airspace disease as compared with 11/06/2018 2. Mildly prominent hilar opacities and borderline widening of the upper mediastinum, probable nodes. Electronically Signed   By: Donavan Foil M.D.   On: 11/07/2018 17:48   Medications: Infusions: . sodium chloride    . sodium chloride      Scheduled Medications: . albuterol  2 puff Inhalation Q6H  . Chlorhexidine Gluconate Cloth  6 each Topical Q0600  . Chlorhexidine Gluconate Cloth   6 each Topical Q0600  . doxercalciferol  0.5 mcg Oral Q T,Th,Sa-HD  . gabapentin  100 mg Oral QHS  . insulin aspart  0-9 Units Subcutaneous TID WC  . insulin glargine  10 Units Subcutaneous Daily  . linagliptin  5 mg Oral Daily  . loratadine  10 mg Oral Daily  . mouth rinse  15 mL Mouth Rinse BID  . Melatonin  6 mg Oral QHS  . methylPREDNISolone (SOLU-MEDROL) injection  60 mg Intravenous BID  . midodrine  10 mg Oral Q T,Th,Sa-HD  . multivitamin  1 tablet Oral QHS  . pravastatin  20 mg Oral q1800  . vitamin C  500 mg Oral Daily  . zinc sulfate  220 mg Oral Daily    have reviewed scheduled and prn medications.  Physical Exam: PE not done as pt is on COVID isolation in order to preserve PPE and to avoid exposure to other providers.  Particulars of exam discussed with primary MD.  Place of service is [place]  11/09/2018,7:19 AM  LOS: 3 days

## 2018-11-09 NOTE — Progress Notes (Signed)
PROGRESS NOTE    Mark [name].  WEX:[id number] DOB: 09-18-1945 DOA: 11/06/2018 PCP: Albina Billet, MD  Brief Narrative:73/M w stage IV SLL/CLL, ESRD on TThS HD, type 2 diabetes, hypertension, hyperlipidemia, history of TIA, anemia of chronic disease, thrombocytopenia and recent positive test for COVID-19 on 10/25/2018 who presents to the ED for evaluation of several days of shortness of breath with dyspnea on minimal exertion, fevers,, nonproductive cough with chest congestion, and poor oral intake due to low appetite.  He also developed diarrhea morning of admission.  Patient states he was asymptomatic on 10/25/2018 at time of initial test.  He is on chronic O2 at night due to mild-moderate restrictive lung disease however has had to use oxygen during the day until 2days ago, temp was 101, presented to ED and admitted   Assessment & Plan:   COVID-19 viral infection: Associated with recent symptoms of fevers, dyspnea, nonproductive cough, poor appetite/p.o. intake, and diarrhea. -SARS COVID PCR test was positive on 7/24 which is 12 days ago  -Repeat chest x-ray 8/7 with bibasilar airspace disease versus atelectasis unchanged from prior  -Finally appears to be clinically improving -Remained stable on 2 L of O2 which is his baseline -Afebrile from yesterday evening, diarrhea also improving -CRP, d-dimer improving, slight worsening of ferritin -Continue IV Solu-Medrol today -Continue albuterol MDI, incentive spirometer, flutter valve -Unable to use pharmacological DVT prophylaxis due to chronic thrombocytopenia -Discharge planning, hopefully home tomorrow  ESRD on TThS HD: Last HD 11/05/2018. 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  Follows with oncology, Dr. Rogue Bussing in [facility]  Pancytopenia: WBC and hemoglobin slightly decreased from  prior but otherwise stable.   -Due to combination of CKD and CLL  -Stable  Type 2 diabetes: Continue Home linagliptin -CBG is high in the setting of IV steroids, increase Lantus, sliding scale  Hypertension: Not currently on antihypertensives.  BP control with HD.  Hyperlipidemia: Continue statin.  DVT prophylaxis: SCDs, due to low platelets Code Status: Partial code, DNI  Family Communication:  Called and updated spouse yesterday Disposition Plan:  Home tomorrow if stable  Consultants:   Renal   Procedures:   Antimicrobials:    Subjective: -Finally feeling better, mild cough, denies any dyspnea, diarrhea has improved, appetite is better  Objective: Vitals:   11/09/18 1000 11/09/18 1015 11/09/18 1030 11/09/18 1035  BP: (!) 118/57 (!) 98/49 (!) 88/45 (!) 93/47  Pulse: 70 73 72 75  Resp: 18     Temp: 98.6 F (37 C)     TempSrc: Oral     SpO2: 95%     Weight:      Height:        Intake/Output Summary (Last 24 hours) at 11/09/2018 1110 Last data filed at 11/08/2018 1700 Gross per 24 hour  Intake 540 ml  Output 0 ml  Net 540 ml   Filed Weights   11/08/18 0600 11/09/18 0420 11/09/18 0944  Weight: 81.3 kg 82.4 kg 82.4 kg    Examination:  Gen: Elderly frail chronically ill male sitting up in bed, AAO x3, no distress HEENT: PERRLA, Neck supple Lungs: Few basilar rhonchi, rest clear CVS: S1-S2/regular rate rhythm Abd: soft, Non tender, non distended, BS present Extremities: No edema, left arm AV fistula Skin: no new rashes Psychiatry: Mood & affect appropriate.     Data Reviewed:   CBC: Recent Labs  Lab 11/06/18 1900 11/07/18  0500 11/08/18 0520 11/09/18 0420  WBC 3.3* 2.6* 2.5* 2.2*  NEUTROABS 1.4* 1.1* 1.0* 0.8*  HGB 10.5* 10.3* 10.5* 10.5*  HCT 31.8* 30.4* 30.8* 31.3*  MCV 105.0* 105.2* 102.7* 102.3*  PLT 79* 80* 85* 97*   Basic Metabolic Panel: Recent Labs  Lab 11/06/18 1900 11/07/18 0500 11/08/18 0520 11/09/18 0420  NA 132* 129*  131* 128*  K 4.3 4.4 4.3 5.5*  CL 93* 89* 91* 88*  CO2 22 21* 21* 19*  GLUCOSE 123* 326* 125* 260*  BUN 49* 56* 52* 85*  CREATININE 8.70* 9.51* 9.67* 11.02*  CALCIUM 8.2* 7.9* 8.1* 8.0*  MG  --  1.9  --   --   PHOS  --  4.9*  --   --    GFR: Estimated Creatinine Clearance: 6.4 mL/min (A) (by C-G formula based on SCr of 11.02 mg/dL (H)). Liver Function Tests: Recent Labs  Lab 11/06/18 1900 11/07/18 0500 11/08/18 0520 11/09/18 0420  AST 28 28 40 31  ALT 27 29 42 42  ALKPHOS 45 45 37* 44  BILITOT 1.1 0.9 1.1 0.7  PROT 6.3* 6.0* 6.1* 6.1*  ALBUMIN 3.0* 2.8* 2.8* 2.6*   No results for input(s): LIPASE, AMYLASE in the last 168 hours. No results for input(s): AMMONIA in the last 168 hours. Coagulation Profile: No results for input(s): INR, PROTIME in the last 168 hours. Cardiac Enzymes: Recent Labs  Lab 11/07/18 0500  CKTOTAL 98   BNP (last 3 results) No results for input(s): PROBNP in the last 8760 hours. HbA1C: Recent Labs    11/07/18 0500  HGBA1C 6.8*   CBG: Recent Labs  Lab 11/08/18 0833 11/08/18 1311 11/08/18 1808 11/08/18 2202 11/09/18 0726  GLUCAP 124* 237* 224* 239* 275*   Lipid Profile: No results for input(s): CHOL, HDL, LDLCALC, TRIG, CHOLHDL, LDLDIRECT in the last 72 hours. Thyroid Function Tests: No results for input(s): TSH, T4TOTAL, FREET4, T3FREE, THYROIDAB in the last 72 hours. Anemia Panel: Recent Labs    11/08/18 0520 11/09/18 0420  FERRITIN 6,057* 6,824*   Urine analysis:    Component Value Date/Time   COLORURINE YELLOW 01/12/2018 0129   APPEARANCEUR CLOUDY (A) 01/12/2018 0129   APPEARANCEUR Clear 01/06/2016 1433   LABSPEC 1.014 01/12/2018 0129   LABSPEC 1.014 10/20/2013 1835   PHURINE 5.0 01/12/2018 0129   GLUCOSEU 50 (A) 01/12/2018 0129   GLUCOSEU 150 mg/dL 10/20/2013 1835   HGBUR MODERATE (A) 01/12/2018 0129   BILIRUBINUR NEGATIVE 01/12/2018 0129   BILIRUBINUR Negative 01/06/2016 1433   BILIRUBINUR Negative 10/20/2013  1835   KETONESUR NEGATIVE 01/12/2018 0129   PROTEINUR >=300 (A) 01/12/2018 0129   NITRITE NEGATIVE 01/12/2018 0129   LEUKOCYTESUR NEGATIVE 01/12/2018 0129   LEUKOCYTESUR Negative 01/06/2016 1433   LEUKOCYTESUR 1+ 10/20/2013 1835   Sepsis Labs: @LABRCNTIP (procalcitonin:4,lacticidven:4)  ) Recent Results (from the past 240 hour(s))  MRSA PCR Screening     Status: None   Collection Time: 11/07/18  1:55 AM   Specimen: Nasal Mucosa; Nasopharyngeal  Result Value Ref Range Status   MRSA by PCR NEGATIVE NEGATIVE Final    Comment:        The GeneXpert MRSA Assay (FDA approved for NASAL specimens only), is one component of a comprehensive MRSA colonization surveillance program. It is not intended to diagnose MRSA infection nor to guide or monitor treatment for MRSA infections. Performed at [facility], [place] [address] [place], [place] [address]          Radiology Studies: Dg Chest  Port 1 View  Result Date: 11/07/2018 CLINICAL DATA:  COVID-19 patient EXAM: PORTABLE CHEST 1 VIEW COMPARISON:  11/06/2018, 03/22/2018 FINDINGS: Right-sided central venous port tip over the cavoatrial region. Stable heart size. No change in basilar airspace disease. No new foci of airspace disease. Bilateral hilar opacities, possible nodes. Slightly broadened appearance of the upper mediastinum. Aortic atherosclerosis. No pneumothorax. IMPRESSION: 1. Overall no significant change in bibasilar airspace disease as compared with 11/06/2018 2. Mildly prominent hilar opacities and borderline widening of the upper mediastinum, probable nodes. Electronically Signed   By: Donavan Foil M.D.   On: 11/07/2018 17:48        Scheduled Meds: . albuterol  2 puff Inhalation Q6H  . Chlorhexidine Gluconate Cloth  6 each Topical Q0600  . Chlorhexidine Gluconate Cloth  6 each Topical Q0600  . doxercalciferol  0.5 mcg Oral Q T,Th,Sa-HD  . gabapentin  100 mg Oral QHS  . insulin aspart  0-9 Units Subcutaneous TID WC   . insulin glargine  10 Units Subcutaneous Daily  . linagliptin  5 mg Oral Daily  . loratadine  10 mg Oral Daily  . mouth rinse  15 mL Mouth Rinse BID  . Melatonin  6 mg Oral QHS  . methylPREDNISolone (SOLU-MEDROL) injection  60 mg Intravenous BID  . midodrine  10 mg Oral Q T,Th,Sa-HD  . multivitamin  1 tablet Oral QHS  . pravastatin  20 mg Oral q1800  . vitamin C  500 mg Oral Daily  . zinc sulfate  220 mg Oral Daily   Continuous Infusions: . sodium chloride    . sodium chloride       LOS: 3 days    Time spent: 44min    Domenic Polite, MD [facility]   11/09/2018, 11:10 AM

## 2018-11-09 NOTE — Progress Notes (Signed)
Attempted to get temp x3 orally (2 different thermometers) and x2 axillary (2 different thermometers). All attempts unsuccessful. Pt doesn't have any teeth and I don't think he is holding the thermometer under his tongue. Skin is cool to touch and thus far tonight he hasn't had a temp.

## 2018-11-09 NOTE — Progress Notes (Signed)
Pt is more energetic and interactive tonight. Pt hasn't had a temp all shift and last BM was before he received Immodium on day shift.

## 2018-11-09 NOTE — Progress Notes (Signed)
CSW acknowledges consult for medication assistance. CSW will inform RNCM to assist. Medication assistance is outside the scope of the CSW's practice. RNCM will assist with medication assistance.   CSW will continue to follow as needed.   Domenic Schwab, MSW, [place] Worker [facility]  [phone]

## 2018-11-10 LAB — CBC WITH DIFFERENTIAL/PLATELET
Abs Immature Granulocytes: 0.08 10*3/uL — ABNORMAL HIGH (ref 0.00–0.07)
Basophils Absolute: 0 10*3/uL (ref 0.0–0.1)
Basophils Relative: 0 %
Eosinophils Absolute: 0 10*3/uL (ref 0.0–0.5)
Eosinophils Relative: 0 %
HCT: 30.2 % — ABNORMAL LOW (ref 39.0–52.0)
Hemoglobin: 10.2 g/dL — ABNORMAL LOW (ref 13.0–17.0)
Immature Granulocytes: 3 %
Lymphocytes Relative: 31 %
Lymphs Abs: 0.7 10*3/uL (ref 0.7–4.0)
MCH: 34.5 pg — ABNORMAL HIGH (ref 26.0–34.0)
MCHC: 33.8 g/dL (ref 30.0–36.0)
MCV: 102 fL — ABNORMAL HIGH (ref 80.0–100.0)
Monocytes Absolute: 0.1 10*3/uL (ref 0.1–1.0)
Monocytes Relative: 3 %
Neutro Abs: 1.5 10*3/uL — ABNORMAL LOW (ref 1.7–7.7)
Neutrophils Relative %: 63 %
Platelets: 119 10*3/uL — ABNORMAL LOW (ref 150–400)
RBC: 2.96 MIL/uL — ABNORMAL LOW (ref 4.22–5.81)
RDW: 12.6 % (ref 11.5–15.5)
WBC: 2.4 10*3/uL — ABNORMAL LOW (ref 4.0–10.5)
nRBC: 0 % (ref 0.0–0.2)

## 2018-11-10 LAB — COMPREHENSIVE METABOLIC PANEL
ALT: 35 U/L (ref 0–44)
AST: 20 U/L (ref 15–41)
Albumin: 2.6 g/dL — ABNORMAL LOW (ref 3.5–5.0)
Alkaline Phosphatase: 44 U/L (ref 38–126)
Anion gap: 21 — ABNORMAL HIGH (ref 5–15)
BUN: 73 mg/dL — ABNORMAL HIGH (ref 8–23)
CO2: 21 mmol/L — ABNORMAL LOW (ref 22–32)
Calcium: 8 mg/dL — ABNORMAL LOW (ref 8.9–10.3)
Chloride: 89 mmol/L — ABNORMAL LOW (ref 98–111)
Creatinine, Ser: 7.67 mg/dL — ABNORMAL HIGH (ref 0.61–1.24)
GFR calc Af Amer: 7 mL/min — ABNORMAL LOW (ref >60)
GFR calc non Af Amer: 6 mL/min — ABNORMAL LOW (ref >60)
Glucose, Bld: 276 mg/dL — ABNORMAL HIGH (ref 70–99)
Potassium: 5.4 mmol/L — ABNORMAL HIGH (ref 3.5–5.1)
Sodium: 131 mmol/L — ABNORMAL LOW (ref 135–145)
Total Bilirubin: 0.5 mg/dL (ref 0.3–1.2)
Total Protein: 5.9 g/dL — ABNORMAL LOW (ref 6.5–8.1)

## 2018-11-10 LAB — BASIC METABOLIC PANEL
Anion gap: 21 — ABNORMAL HIGH (ref 5–15)
BUN: 79 mg/dL — ABNORMAL HIGH (ref 8–23)
CO2: 20 mmol/L — ABNORMAL LOW (ref 22–32)
Calcium: 7.9 mg/dL — ABNORMAL LOW (ref 8.9–10.3)
Chloride: 89 mmol/L — ABNORMAL LOW (ref 98–111)
Creatinine, Ser: 7.85 mg/dL — ABNORMAL HIGH (ref 0.61–1.24)
GFR calc Af Amer: 7 mL/min — ABNORMAL LOW (ref >60)
GFR calc non Af Amer: 6 mL/min — ABNORMAL LOW (ref >60)
Glucose, Bld: 271 mg/dL — ABNORMAL HIGH (ref 70–99)
Potassium: 5.4 mmol/L — ABNORMAL HIGH (ref 3.5–5.1)
Sodium: 130 mmol/L — ABNORMAL LOW (ref 135–145)

## 2018-11-10 LAB — D-DIMER, QUANTITATIVE: D-Dimer, Quant: 2.84 ug/mL-FEU — ABNORMAL HIGH (ref 0.00–0.50)

## 2018-11-10 LAB — GLUCOSE, CAPILLARY
Glucose-Capillary: 222 mg/dL — ABNORMAL HIGH (ref 70–99)
Glucose-Capillary: 254 mg/dL — ABNORMAL HIGH (ref 70–99)

## 2018-11-10 LAB — FERRITIN: Ferritin: 6057 ng/mL — ABNORMAL HIGH (ref 24–336)

## 2018-11-10 LAB — C-REACTIVE PROTEIN: CRP: 18 mg/dL — ABNORMAL HIGH (ref <1.0)

## 2018-11-10 MED ORDER — BISACODYL 10 MG RE SUPP
10.0000 mg | Freq: Once | RECTAL | Status: AC
  Administered 2018-11-10: 10 mg via RECTAL
  Filled 2018-11-10: qty 1

## 2018-11-10 MED ORDER — SODIUM POLYSTYRENE SULFONATE PO POWD: Freq: Once | ORAL | 0 refills | Status: DC

## 2018-11-10 MED ORDER — SODIUM POLYSTYRENE SULFONATE PO POWD: Freq: Once | ORAL | 0 refills | Status: AC

## 2018-11-10 MED ORDER — SODIUM ZIRCONIUM CYCLOSILICATE 10 G PO PACK
10.0000 g | PACK | Freq: Once | ORAL | Status: AC
  Administered 2018-11-10: 10 g via ORAL
  Filled 2018-11-10: qty 1

## 2018-11-10 MED ORDER — GLIMEPIRIDE 2 MG PO TABS: 2.0000 mg | ORAL_TABLET | ORAL | Status: AC

## 2018-11-10 MED ORDER — PREDNISONE 10 MG PO TABS: 30.0000 mg | ORAL_TABLET | Freq: Every day | ORAL | 0 refills | Status: AC

## 2018-11-10 MED ORDER — ANTICOAGULANT SODIUM CITRATE 4% (200MG/5ML) IV SOLN
5.0000 mL | Status: AC | PRN
  Administered 2018-11-10: 5 mL
  Filled 2018-11-10: qty 5

## 2018-11-10 NOTE — Discharge Summary (Signed)
Physician Discharge Summary  Mark [name]. WPY:[id number] DOB: 1945/05/14 DOA: 11/06/2018  PCP: Albina Billet, MD  Admit date: 11/06/2018 Discharge date: 11/10/2018  Time spent: 45 minutes  Recommendations for Outpatient Follow-up:  1. Strict low potassium and carb modified diet 2. 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 ([place])   Pancytopenia ([place])   Discharge Condition: Stable  Diet recommendation: Renal diabetic  Filed Weights   11/09/18 0420 11/09/18 0944 11/09/18 1245  Weight: 82.4 kg 82.4 kg 81 kg    History of present illness:  73/M w stage IV SLL/CLL, ESRD onTThSHD, type 2 diabetes, hypertension, hyperlipidemia, history of TIA, anemia of chronic disease, thrombocytopenia and recent positive test for COVID-19 on 10/25/2018 who presents to the ED for evaluation ofseveral days of shortness of breath with dyspnea on minimal exertion, fevers,, nonproductive cough with chest congestion, and poor oral intake due to low appetite. He also developed diarrhea morning of admission. Patient states he was asymptomatic on 10/25/2018 at time of initial test. He is on chronic O2 at night due to mild-moderate restrictive lung disease however has had to use oxygen during the day until 2days ago, temp was 101, presented to ED and admitted  Hospital Course:   COVID-19 viral infection: Associated with recent symptoms of fevers, dyspnea, nonproductive cough, poor appetite/p.o. intake, and diarrhea. -SARS COVID PCR test was positive on 7/24 which is 12 days ago  -Repeat chest x-ray 8/7 with bibasilar airspace disease versus atelectasis unchanged from prior  -Clinically improved  and stable, oxygenation remained stable throughout hospitalization has remained on 2 L of O2 which is his baseline -Treated with IV Solu-Medrol and transitioned to prednisone for 3 more days at discharge -Also treated with supportive care, albuterol MDI, incentive spirometer, flutter valve -Unable to use pharmacological DVT prophylaxis due to chronic thrombocytopenia -Discharge planning, hopefully home tomorrow  ESRD onTThSHD: Last HD 11/05/2018. -Nephrology following, dialyzed yesterday -Potassium 5.4 today, see discussion below  Hyperkalemia -Secondary to ESRD, potassium at 5.4 despite dialysis yesterday, discussed with nephrologist Dr. 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 [facility], Dr.Brahmanday in [place]  Pancytopenia: WBC and hemoglobin slightly decreased from prior but otherwise stable.  -Due to combination of CKD and CLL  -Stable  Type 2 diabetes: Continue Home linagliptin and glipizide -Given insulin in the setting of hyperglycemia from steroids, advised to increase glipizide dose for the next 3 days while on prednisone  Hypertension: Not currently on antihypertensives. BP control with HD.  Hyperlipidemia: Continue statin.  Discharge Exam: Vitals:   11/10/18 0713 11/10/18 1114  BP: (!) 142/64 (!) 141/61  Pulse: 67 83  Resp:    Temp: 99.5 F (37.5 C) 99.8 F (37.7 C)  SpO2: 100% 100%    General: AAOx3 Cardiovascular: S1S2/RRR Respiratory: Rare basilar crackle  Discharge Instructions   Discharge Instructions    Discharge instructions  Complete  by: As directed    Renal and diabetic diet, please be very careful about low potassium diet especially until next dialysis on Tuesday and be very careful about carbohydrate modified diet, especially while on prednisone   Increase activity slowly   Complete by: As directed      Allergies as of 11/10/2018      Reactions   Acyclovir And Related Other (See Comments)   Unknown reaction (tolerates low dose)   Heparin Other (See Comments)   Excessive bleeding per pt.    Ibuprofen Other (See Comments)   DUE TO DIALYSIS   Multivitamin [centrum] Other (See Comments)   DUE TO DIALYSIS   Daypro [oxaprozin] Swelling, Rash   Tape Rash      Medication List    TAKE these medications   acyclovir 400 MG tablet Commonly known as: ZOVIRAX Take 200 mg by mouth See admin instructions. Take 1/2 tablet (200 mg) by mouth on Tuesday, Thursday, Saturday nights (dialysis days)   albuterol (2.5 MG/3ML) 0.083% nebulizer solution Commonly known as: PROVENTIL Take 2.5 mg by nebulization every 6 (six) hours as needed for wheezing or shortness of breath.   amitriptyline 10 MG tablet Commonly known as: ELAVIL Take 10 mg by mouth at bedtime.   butalbital-acetaminophen-caffeine 50-325-40 MG tablet Commonly known as: FIORICET Take 1 tablet by mouth every 4 (four) hours as needed for headache or migraine.   calcium acetate 667 MG capsule Commonly known as: PHOSLO Take 667 mg by mouth 3 (three) times daily with meals.   cetirizine 10 MG tablet Commonly known as: ZYRTEC Take 10 mg by mouth daily as needed for allergies.   docusate sodium 100 MG capsule Commonly known as: COLACE Take 100 mg by mouth daily as needed for mild constipation.   Durezol 0.05 % Emul Generic drug: Difluprednate Place 1 drop into the left eye 2 (two) times daily.   Ensure Clear Liqd Take 237 mLs by mouth See admin instructions. Drink one can (237 mls) by mouth on Tuesday, Thursday, Saturday evenings (dialysis days)    fluticasone 50 MCG/ACT nasal spray Commonly known as: FLONASE Place 1 spray into both nostrils daily as needed for allergies.   gabapentin 100 MG capsule Commonly known as: NEURONTIN Take 1 capsule (100 mg total) by mouth 3 (three) times daily. What changed:   how much to take  when to take this   glimepiride 2 MG tablet Commonly known as: AMARYL Take 1 tablet (2 mg total) by mouth See admin instructions. Take 2mg  with meals for next 4days while on prednisone and then resume prior instructions of Take 1/2 tablet (1 mg) by mouth on Tuesday, Thursday, Saturday nights (dialysis days); may take 1 tablet (2 mg) on those nights for CBG >250 What changed:   how much to take  additional instructions   linagliptin 5 MG Tabs tablet Commonly known as: TRADJENTA Take 5 mg by mouth at bedtime.   lovastatin 20 MG tablet Commonly known as: MEVACOR Take 20 mg by mouth at bedtime.   Melatonin 5 MG Tabs Take 5 mg by mouth at bedtime.   metoCLOPramide 5 MG tablet Commonly known as: REGLAN Take 5 mg by mouth See admin instructions. Take one tablet (5 mg) by mouth twice daily on Sunday, Monday, Wednesday, Friday (non-dialysis days), take one tablet (5 mg) at night on Tuesday, Thursday, Saturday (dialysis days)   midodrine 10 MG tablet Commonly known as: PROAMATINE Take 1 tablet (10 mg total) by mouth every dialysis (MWF).  What changed:   when to take this  additional instructions   multivitamin Tabs tablet Take 1 tablet by mouth at bedtime.   nystatin cream Commonly known as: MYCOSTATIN Apply 1 application topically 2 (two) times daily as needed (irritation).   OXYGEN Inhale 2 L into the lungs continuous.   polyethylene glycol 17 g packet Commonly known as: MIRALAX / GLYCOLAX Take 17 g by mouth daily. What changed:   when to take this  reasons to take this   predniSONE 10 MG tablet Commonly known as: DELTASONE Take 3 tablets (30 mg total) by mouth daily with breakfast  for 3 days.   Refresh 1.4-0.6 % Soln Generic drug: Polyvinyl Alcohol-Povidone PF Place 1 drop into both eyes 2 (two) times daily.   silver sulfADIAZINE 1 % cream Commonly known as: SILVADENE APPLY 1 APPLICATION TOPICALY DAILY What changed: See the new instructions.   sodium chloride 0.65 % Soln nasal spray Commonly known as: OCEAN Place 1 spray into both nostrils as needed for congestion.   sodium polystyrene powder Commonly known as: KAYEXALATE Take by mouth once for 1 dose. Take 30grams  once on Monday 8/10      Allergies  Allergen Reactions  . Acyclovir And Related Other (See Comments)    Unknown reaction (tolerates low dose)  . Heparin Other (See Comments)    Excessive bleeding per pt.   . Ibuprofen Other (See Comments)    DUE TO DIALYSIS  . Multivitamin [Centrum] Other (See Comments)    DUE TO DIALYSIS  . Daypro [Oxaprozin] Swelling and Rash  . Tape Rash      The results of significant diagnostics from this hospitalization (including imaging, microbiology, ancillary and laboratory) are listed below for reference.    Significant Diagnostic Studies: Dg Chest Port 1 View  Result Date: 11/07/2018 CLINICAL DATA:  COVID-19 patient EXAM: PORTABLE CHEST 1 VIEW COMPARISON:  11/06/2018, 03/22/2018 FINDINGS: Right-sided central venous port tip over the cavoatrial region. Stable heart size. No change in basilar airspace disease. No new foci of airspace disease. Bilateral hilar opacities, possible nodes. Slightly broadened appearance of the upper mediastinum. Aortic atherosclerosis. No pneumothorax. IMPRESSION: 1. Overall no significant change in bibasilar airspace disease as compared with 11/06/2018 2. Mildly prominent hilar opacities and borderline widening of the upper mediastinum, probable nodes. Electronically Signed   By: Donavan Foil M.D.   On: 11/07/2018 17:48   Dg Chest Portable 1 View  Result Date: 11/06/2018 CLINICAL DATA:  COVID-19 infection. Shortness of breath.  EXAM: PORTABLE CHEST 1 VIEW COMPARISON:  Chest x-ray dated 03/22/2018 and chest CT dated 01/06/2018 FINDINGS: Power port in place, unchanged with the tip just above the cavoatrial junction in good position. The heart size and pulmonary vascularity are normal. Increased mediastinal adenopathy since the prior chest x-ray. New bibasilar linear atelectasis. No focal infiltrates or effusions. No acute bone abnormality. IMPRESSION: 1. Increased mediastinal adenopathy. 2. New bibasilar atelectasis. 3. Lungs otherwise clear. 4.  Aortic Atherosclerosis (ICD10-I70.0). Electronically Signed   By: [name] M.D.   On: 11/06/2018 16:49    Microbiology: Recent Results (from the past 240 hour(s))  MRSA PCR Screening     Status: None   Collection Time: 11/07/18  1:55 AM   Specimen: Nasal Mucosa; Nasopharyngeal  Result Value Ref Range Status   MRSA by PCR NEGATIVE NEGATIVE Final    Comment:        The GeneXpert MRSA Assay (FDA approved for NASAL specimens only), is one component of a comprehensive MRSA  colonization surveillance program. It is not intended to diagnose MRSA infection nor to guide or monitor treatment for MRSA infections. Performed at [facility], [place] [address] [place], [place] [address]   Culture, blood (Routine X 2) w Reflex to ID Panel     Status: None (Preliminary result)   Collection Time: 11/09/18  4:20 AM   Specimen: BLOOD  Result Value Ref Range Status   Specimen Description BLOOD PORTA CATH  Final   Special Requests   Final    BOTTLES DRAWN AEROBIC AND ANAEROBIC Blood Culture adequate volume   Culture   Final    NO GROWTH 1 DAY Performed at [facility], [place] [address], [place], [place] [address]    Report Status PENDING  Incomplete  Culture, blood (Routine X 2) w Reflex to ID Panel     Status: None (Preliminary result)   Collection Time: 11/09/18 10:37 AM   Specimen: BLOOD  Result Value Ref Range Status   Specimen Description BLOOD SITE NOT SPECIFIED   Final   Special Requests   Final    BOTTLES DRAWN AEROBIC ONLY Blood Culture adequate volume   Culture   Final    NO GROWTH 1 DAY Performed at [facility], [place] [address], [place], [place] [address]    Report Status PENDING  Incomplete     Labs: Basic Metabolic Panel: Recent Labs  Lab 11/07/18 0500 11/08/18 0520 11/09/18 0420 11/10/18 0545 11/10/18 0830  NA 129* 131* 128* 131* 130*  K 4.4 4.3 5.5* 5.4* 5.4*  CL 89* 91* 88* 89* 89*  CO2 21* 21* 19* 21* 20*  GLUCOSE 326* 125* 260* 276* 271*  BUN 56* 52* 85* 73* 79*  CREATININE 9.51* 9.67* 11.02* 7.67* 7.85*  CALCIUM 7.9* 8.1* 8.0* 8.0* 7.9*  MG 1.9  --   --   --   --   PHOS 4.9*  --   --   --   --    Liver Function Tests: Recent Labs  Lab 11/06/18 1900 11/07/18 0500 11/08/18 0520 11/09/18 0420 11/10/18 0545  AST 28 28 40 31 20  ALT 27 29 42 42 35  ALKPHOS 45 45 37* 44 44  BILITOT 1.1 0.9 1.1 0.7 0.5  PROT 6.3* 6.0* 6.1* 6.1* 5.9*  ALBUMIN 3.0* 2.8* 2.8* 2.6* 2.6*   No results for input(s): LIPASE, AMYLASE in the last 168 hours. No results for input(s): AMMONIA in the last 168 hours. CBC: Recent Labs  Lab 11/06/18 1900 11/07/18 0500 11/08/18 0520 11/09/18 0420 11/10/18 0545  WBC 3.3* 2.6* 2.5* 2.2* 2.4*  NEUTROABS 1.4* 1.1* 1.0* 0.8* 1.5*  HGB 10.5* 10.3* 10.5* 10.5* 10.2*  HCT 31.8* 30.4* 30.8* 31.3* 30.2*  MCV 105.0* 105.2* 102.7* 102.3* 102.0*  PLT 79* 80* 85* 97* 119*   Cardiac Enzymes: Recent Labs  Lab 11/07/18 0500  CKTOTAL 98   BNP: BNP (last 3 results) Recent Labs    03/08/18 2024 11/06/18 2347  BNP 316.0* 326.7*    ProBNP (last 3 results) No results for input(s): PROBNP in the last 8760 hours.  CBG: Recent Labs  Lab 11/09/18 1207 11/09/18 1550 11/09/18 2211 11/10/18 0710 11/10/18 1112  GLUCAP 121* 355* 302* 222* 254*       Signed:  Domenic Polite MD.  [facility] 11/10/2018, 11:36 AM

## 2018-11-10 NOTE — TOC Initial Note (Signed)
Transition of Care (TOC) - Initial/Assessment Note    Patient Details  Name: Mark [name]. MRN: [id number] Date of Birth: 07/23/1945  Transition of Care [facility]) CM/SW Contact:    Erenest Rasher, RN Phone Number: [phone] 11/10/2018, 12:53 PM  Clinical Narrative:                 Spoke to pt and offered choice for [facility]. Pt declines due to COVID 19. States he had [facility] in the past for HHPT. States he back to his baseline and will just work on getting stronger. He really needed aide but it was cancelled due to [place] in the home. His son was recently dc. He goes to HD. Message sent to Dialysis Navigator, Terri Piedra that pt is dc home.   Expected Discharge Plan: [place]     Patient Goals and CMS Choice Patient states their goals for this hospitalization and ongoing recovery are:: need a new body CMS Medicare.gov Compare Post Acute Care list provided to:: Patient Choice offered to / list presented to : Patient  Expected Discharge Plan and Services Expected Discharge Plan: [place]   Discharge Planning Services: CM Consult Post Acute Care Choice: [place] arrangements for the past 2 months: Single Family Home Expected Discharge Date: 11/10/18                         [facility] Arranged: Patient Refused HH(declines, had [facility] in the past)          Prior Living Arrangements/Services Living arrangements for the past 2 months: Single Family Home Lives with:: Spouse, Adult Children Patient language and need for interpreter reviewed:: Yes Do you feel safe going back to the place where you live?: Yes      Need for Family Participation in Patient Care: Yes (Comment) Care giver support system in place?: Yes (comment) Current home services: DME(rolling walker, wheelchair, ramp, handicap bathroom) Criminal Activity/Legal Involvement Pertinent to Current Situation/Hospitalization: No - Comment as needed  Activities of Daily  Living Home Assistive Devices/Equipment: Brace (specify type), Oxygen, Walker (specify type)(4 wheeled walker, RLE brace) ADL Screening (condition at time of admission) Patient's cognitive ability adequate to safely complete daily activities?: Yes Is the patient deaf or have difficulty hearing?: No Does the patient have difficulty seeing, even when wearing glasses/contacts?: No Does the patient have difficulty concentrating, remembering, or making decisions?: No Patient able to express need for assistance with ADLs?: Yes Does the patient have difficulty dressing or bathing?: No Independently performs ADLs?: No Communication: Independent Dressing (OT): Independent Grooming: Independent Feeding: Independent Bathing: Independent Toileting: Independent In/Out Bed: Independent Walks in Home: Independent with device (comment)(walker and shoe brace) Does the patient have difficulty walking or climbing stairs?: Yes Weakness of Legs: Both Weakness of Arms/Hands: None  Permission Sought/Granted Permission sought to share information with : Case Manager, PCP, Family Supports, Other (comment) Permission granted to share information with : Yes, Verbal Permission Granted  Share Information with NAME: Dorthula Matas  Permission granted to share info w AGENCY: [facility]  Permission granted to share info w Relationship: wife  Permission granted to share info w Contact Information: [phone]  Emotional Assessment       Orientation: : Oriented to Self, Oriented to Place, Oriented to Situation, Oriented to  Time   Psych Involvement: No (comment)  Admission diagnosis:  Hypoxia [R09.02] COVID-19 [U07.1, J98.8] COVID-19 virus infection [U07.1] Patient Active Problem List  Diagnosis Date Noted  . COVID-19 virus infection 11/06/2018  . Pancytopenia ([place]) 11/06/2018  . S/P IVC filter 04/23/2018  . Malignant neoplasm of urinary bladder ([place]) 02/07/2018  . Respiratory failure with hypoxia  ([place]) 01/17/2018  . Acute respiratory failure with hypoxia ([place]) 01/12/2018  . Acute respiratory failure ([place]) 01/12/2018  . Symptomatic anemia 12/31/2017  . Acute on chronic respiratory failure with hypoxemia ([place]) 12/31/2017  . Hypoxia 12/25/2017  . Pressure injury of skin 12/16/2017  . Fluid overload 01/15/2017  . UTI (urinary tract infection) 10/20/2016  . Sepsis ([place]) 10/20/2016  . 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 Ulcer of foot, chronic ([place]) 03/21/2015  . History of bladder cancer 11/10/2014  . Penile bleeding 10/12/2014  . Dependence on renal dialysis ([place]) 08/18/2013  . Arterial blood pressure decreased 08/18/2013  . Leukemia ([place]) 08/18/2013  . Retina disorder 08/18/2013  . Breath shortness 08/18/2013  . End-stage renal disease on hemodialysis ([place]) 12/23/2012  . Absolute anemia 07/18/2012  . HPTH (hyperparathyroidism) ([place]) 07/18/2012  . Acidosis, metabolic 23/76/2831  . Hyperparathyroidism ([place]) 07/18/2012  . Abnormal presence of protein in urine 01/01/2012  . HLD (hyperlipidemia) 12/25/2011  . Diabetic retinopathy associated with type 2 diabetes mellitus ([place]) 10/26/1999  . Essential (primary) hypertension 04/04/1999  . Diabetes mellitus type 2, uncontrolled ([place]) 04/04/1999  . Type 2 diabetes mellitus with other diabetic kidney complication ([place]) 51/76/1607   PCP:  Albina Billet, MD Pharmacy:   [place], [place] - [address] [place] [address] Phone: [phone] Fax: [phone]  [facility] [place], [place] [address] Phone: [phone] Fax: [phone]  [id number] [place], [place] - [address] [place] [address] Phone: [phone] Fax: [phone]     Social  Determinants of Health (SDOH) Interventions    Readmission Risk Interventions No flowsheet data found.

## 2018-11-10 NOTE — Progress Notes (Signed)
Subjective:  Fever subsiding.  HD yest- removed only 1300- had BP drop   Objective Vital signs in last 24 hours: Vitals:   11/09/18 2214 11/09/18 2350 11/10/18 0600 11/10/18 0713  BP: (!) 152/70 (!) 122/49 137/64 (!) 142/64  Pulse: 68 70 65 67  Resp:      Temp: 98.5 F (36.9 C)  98.9 F (37.2 C) 99.5 F (37.5 C)  TempSrc: Oral  Oral Oral  SpO2: 100%  98% 100%  Weight:      Height:       Weight change: 0 kg  Intake/Output Summary (Last 24 hours) at 11/10/2018 0727 Last data filed at 11/10/2018 0500 Gross per 24 hour  Intake 340 ml  Output 1300 ml  Net -960 ml    Dialyzes at [facility].  TTS 3-1/2 hours HD Bath 2K/3 calcium, Dialyzer unknown, Heparin no. Access left aVF.  400/800 Hectorol 0.5 mcg q. treatment and Venofer 50 weekly  Assessment/Plan: 73 year old black male with hypertension, diabetes, lymphoma/leukemia and ESRD who presents with symptomatic COVID 1 symptomatic COVID-per hospitalist with supportive care- seems to be improving 2 ESRD: Normally TTS at [facility] currently on the COVID shift.  Our plan is for him to get routine dialysis in isolation- next planned for Tuesday via AVF.  K is 5.4 post HD ? Not sure why.   Will give lokelma today.  Possibly for discharge.... Will need to watch K for next couple of days - he should know how to do that in his HD teachings vs give him one dose of kaexylate for tomorrow  - discussed with primary team  3 Hypertension: Blood pressure seems very well controlled.  He is not on any blood pressure medications and is actually on Midodrine.  Given that his sodium is low attempted some ultrafiltration with dialysis even though he is under his dry weight- not that successful  4. Anemia of ESRD: Hemoglobin greater than 10.  He receives iron at the dialysis unit but noted that his ferritin is greater than 4000 so will not give.   We will give ESA if hemoglobin goes below 10 5. Metabolic Bone Disease: Calcium and phosphorus  now at acceptable levels.  I do not see a binder on his medication list.  His home dose of Hectorol will be continued   [place]: Basic Metabolic Panel: Recent Labs  Lab 11/07/18 0500 11/08/18 0520 11/09/18 0420 11/10/18 0545  NA 129* 131* 128* 131*  K 4.4 4.3 5.5* 5.4*  CL 89* 91* 88* 89*  CO2 21* 21* 19* 21*  GLUCOSE 326* 125* 260* 276*  BUN 56* 52* 85* 73*  CREATININE 9.51* 9.67* 11.02* 7.67*  CALCIUM 7.9* 8.1* 8.0* 8.0*  PHOS 4.9*  --   --   --    Liver Function Tests: Recent Labs  Lab 11/08/18 0520 11/09/18 0420 11/10/18 0545  AST 40 31 20  ALT 42 42 35  ALKPHOS 37* 44 44  BILITOT 1.1 0.7 0.5  PROT 6.1* 6.1* 5.9*  ALBUMIN 2.8* 2.6* 2.6*   No results for input(s): LIPASE, AMYLASE in the last 168 hours. No results for input(s): AMMONIA in the last 168 hours. CBC: Recent Labs  Lab 11/06/18 1900 11/07/18 0500 11/08/18 0520 11/09/18 0420  WBC 3.3* 2.6* 2.5* 2.2*  NEUTROABS 1.4* 1.1* 1.0* 0.8*  HGB 10.5* 10.3* 10.5* 10.5*  HCT 31.8* 30.4* 30.8* 31.3*  MCV 105.0* 105.2* 102.7* 102.3*  PLT 79* 80* 85*  97*   Cardiac Enzymes: Recent Labs  Lab 11/07/18 0500  CKTOTAL 98   CBG: Recent Labs  Lab 11/09/18 0726 11/09/18 1207 11/09/18 1550 11/09/18 2211 11/10/18 0710  GLUCAP 275* 121* 355* 302* 222*    Iron Studies:  Recent Labs    11/09/18 0420  FERRITIN 6,824*   Studies/Results: No results found. Medications: Infusions: . sodium chloride    . sodium chloride      Scheduled Medications: . albuterol  2 puff Inhalation Q6H  . Chlorhexidine Gluconate Cloth  6 each Topical Q0600  . Chlorhexidine Gluconate Cloth  6 each Topical Q0600  . doxercalciferol  0.5 mcg Oral Q T,Th,Sa-HD  . gabapentin  100 mg Oral QHS  . insulin aspart  0-9 Units Subcutaneous TID WC  . insulin glargine  20 Units Subcutaneous Daily  . linagliptin  5 mg Oral Daily  . loratadine  10 mg Oral Daily  . mouth rinse  15 mL Mouth Rinse BID  . Melatonin   6 mg Oral QHS  . methylPREDNISolone (SOLU-MEDROL) injection  60 mg Intravenous BID  . midodrine  10 mg Oral Q T,Th,Sa-HD  . multivitamin  1 tablet Oral QHS  . pantoprazole  40 mg Oral Q1200  . pravastatin  20 mg Oral q1800  . vitamin C  500 mg Oral Daily  . zinc sulfate  220 mg Oral Daily    have reviewed scheduled and prn medications.  Physical Exam: PE not done as pt is on COVID isolation in order to preserve PPE and to avoid exposure to other providers.  Particulars of exam discussed with primary MD.  Place of service is   11/10/2018,7:27 AM  LOS: 4 days

## 2018-11-10 NOTE — Progress Notes (Signed)
Daily Nursing Note   Received report from Amy, RN. Assessed patient who was up in the chair asking when he would be going home. K noted to be 5.4 this AM a repeat BMP was ordered and lokelma administered. Patient reports feeling well since admission. Decrease O2 to 1LPM, tolerating well. Patient received a suppository with a small bowel movement. Reviewed all discharge medications. IV RN to [employer].   DC Plan: Discharged this afternoon (1447PM). Patients wife and daughter picked up.

## 2018-11-12 LAB — PATHOLOGIST SMEAR REVIEW

## 2018-11-14 LAB — CULTURE, BLOOD (ROUTINE X 2)
Culture: NO GROWTH
Culture: NO GROWTH
Special Requests: ADEQUATE
Special Requests: ADEQUATE

## 2018-11-15 ENCOUNTER — Inpatient Hospital Stay (HOSPITAL_COMMUNITY): Payer: Medicare HMO

## 2018-11-15 ENCOUNTER — Inpatient Hospital Stay (HOSPITAL_COMMUNITY)
Admission: EM | Admit: 2018-11-15 | Discharge: 2018-12-03 | DRG: 871 | Disposition: E | Payer: Medicare HMO | Attending: Internal Medicine | Admitting: Internal Medicine

## 2018-11-15 ENCOUNTER — Emergency Department (HOSPITAL_COMMUNITY): Payer: Medicare HMO

## 2018-11-15 ENCOUNTER — Encounter (HOSPITAL_COMMUNITY): Payer: Self-pay | Admitting: *Deleted

## 2018-11-15 ENCOUNTER — Other Ambulatory Visit: Payer: Self-pay

## 2018-11-15 DIAGNOSIS — H9190 Unspecified hearing loss, unspecified ear: Secondary | ICD-10-CM | POA: Diagnosis present

## 2018-11-15 DIAGNOSIS — U071 COVID-19: Secondary | ICD-10-CM

## 2018-11-15 DIAGNOSIS — K589 Irritable bowel syndrome without diarrhea: Secondary | ICD-10-CM | POA: Diagnosis present

## 2018-11-15 DIAGNOSIS — J9621 Acute and chronic respiratory failure with hypoxia: Secondary | ICD-10-CM | POA: Diagnosis present

## 2018-11-15 DIAGNOSIS — E1140 Type 2 diabetes mellitus with diabetic neuropathy, unspecified: Secondary | ICD-10-CM | POA: Diagnosis present

## 2018-11-15 DIAGNOSIS — E1122 Type 2 diabetes mellitus with diabetic chronic kidney disease: Secondary | ICD-10-CM | POA: Diagnosis present

## 2018-11-15 DIAGNOSIS — Z87891 Personal history of nicotine dependence: Secondary | ICD-10-CM

## 2018-11-15 DIAGNOSIS — I679 Cerebrovascular disease, unspecified: Secondary | ICD-10-CM | POA: Diagnosis present

## 2018-11-15 DIAGNOSIS — M1990 Unspecified osteoarthritis, unspecified site: Secondary | ICD-10-CM | POA: Diagnosis present

## 2018-11-15 DIAGNOSIS — Z66 Do not resuscitate: Secondary | ICD-10-CM | POA: Diagnosis not present

## 2018-11-15 DIAGNOSIS — Z7984 Long term (current) use of oral hypoglycemic drugs: Secondary | ICD-10-CM

## 2018-11-15 DIAGNOSIS — F329 Major depressive disorder, single episode, unspecified: Secondary | ICD-10-CM | POA: Diagnosis present

## 2018-11-15 DIAGNOSIS — J1289 Other viral pneumonia: Secondary | ICD-10-CM | POA: Diagnosis present

## 2018-11-15 DIAGNOSIS — Z8551 Personal history of malignant neoplasm of bladder: Secondary | ICD-10-CM

## 2018-11-15 DIAGNOSIS — K219 Gastro-esophageal reflux disease without esophagitis: Secondary | ICD-10-CM | POA: Diagnosis present

## 2018-11-15 DIAGNOSIS — E785 Hyperlipidemia, unspecified: Secondary | ICD-10-CM | POA: Diagnosis present

## 2018-11-15 DIAGNOSIS — D61818 Other pancytopenia: Secondary | ICD-10-CM | POA: Diagnosis present

## 2018-11-15 DIAGNOSIS — E46 Unspecified protein-calorie malnutrition: Secondary | ICD-10-CM | POA: Diagnosis present

## 2018-11-15 DIAGNOSIS — I120 Hypertensive chronic kidney disease with stage 5 chronic kidney disease or end stage renal disease: Secondary | ICD-10-CM | POA: Diagnosis present

## 2018-11-15 DIAGNOSIS — Z8673 Personal history of transient ischemic attack (TIA), and cerebral infarction without residual deficits: Secondary | ICD-10-CM

## 2018-11-15 DIAGNOSIS — E1165 Type 2 diabetes mellitus with hyperglycemia: Secondary | ICD-10-CM | POA: Diagnosis present

## 2018-11-15 DIAGNOSIS — J988 Other specified respiratory disorders: Secondary | ICD-10-CM | POA: Diagnosis not present

## 2018-11-15 DIAGNOSIS — Z7951 Long term (current) use of inhaled steroids: Secondary | ICD-10-CM

## 2018-11-15 DIAGNOSIS — R531 Weakness: Secondary | ICD-10-CM | POA: Diagnosis not present

## 2018-11-15 DIAGNOSIS — Z9981 Dependence on supplemental oxygen: Secondary | ICD-10-CM

## 2018-11-15 DIAGNOSIS — L89152 Pressure ulcer of sacral region, stage 2: Secondary | ICD-10-CM | POA: Diagnosis present

## 2018-11-15 DIAGNOSIS — D631 Anemia in chronic kidney disease: Secondary | ICD-10-CM | POA: Diagnosis present

## 2018-11-15 DIAGNOSIS — Z8619 Personal history of other infectious and parasitic diseases: Secondary | ICD-10-CM

## 2018-11-15 DIAGNOSIS — R0902 Hypoxemia: Secondary | ICD-10-CM | POA: Diagnosis not present

## 2018-11-15 DIAGNOSIS — Z9221 Personal history of antineoplastic chemotherapy: Secondary | ICD-10-CM

## 2018-11-15 DIAGNOSIS — I248 Other forms of acute ischemic heart disease: Secondary | ICD-10-CM | POA: Diagnosis present

## 2018-11-15 DIAGNOSIS — L8990 Pressure ulcer of unspecified site, unspecified stage: Secondary | ICD-10-CM | POA: Diagnosis present

## 2018-11-15 DIAGNOSIS — R0602 Shortness of breath: Secondary | ICD-10-CM | POA: Diagnosis present

## 2018-11-15 DIAGNOSIS — A4189 Other specified sepsis: Principal | ICD-10-CM | POA: Diagnosis present

## 2018-11-15 DIAGNOSIS — N186 End stage renal disease: Secondary | ICD-10-CM | POA: Diagnosis present

## 2018-11-15 DIAGNOSIS — Z515 Encounter for palliative care: Secondary | ICD-10-CM | POA: Diagnosis not present

## 2018-11-15 DIAGNOSIS — Z95828 Presence of other vascular implants and grafts: Secondary | ICD-10-CM

## 2018-11-15 DIAGNOSIS — Z79899 Other long term (current) drug therapy: Secondary | ICD-10-CM

## 2018-11-15 DIAGNOSIS — C9110 Chronic lymphocytic leukemia of B-cell type not having achieved remission: Secondary | ICD-10-CM | POA: Diagnosis present

## 2018-11-15 DIAGNOSIS — Z992 Dependence on renal dialysis: Secondary | ICD-10-CM

## 2018-11-15 LAB — CBC WITH DIFFERENTIAL/PLATELET
Abs Immature Granulocytes: 0.01 10*3/uL (ref 0.00–0.07)
Basophils Absolute: 0 10*3/uL (ref 0.0–0.1)
Basophils Relative: 0 %
Eosinophils Absolute: 0 10*3/uL (ref 0.0–0.5)
Eosinophils Relative: 0 %
HCT: 24.1 % — ABNORMAL LOW (ref 39.0–52.0)
Hemoglobin: 8 g/dL — ABNORMAL LOW (ref 13.0–17.0)
Immature Granulocytes: 0 %
Lymphocytes Relative: 25 %
Lymphs Abs: 0.6 10*3/uL — ABNORMAL LOW (ref 0.7–4.0)
MCH: 34.6 pg — ABNORMAL HIGH (ref 26.0–34.0)
MCHC: 33.2 g/dL (ref 30.0–36.0)
MCV: 104.3 fL — ABNORMAL HIGH (ref 80.0–100.0)
Monocytes Absolute: 0.1 10*3/uL (ref 0.1–1.0)
Monocytes Relative: 3 %
Neutro Abs: 1.6 10*3/uL — ABNORMAL LOW (ref 1.7–7.7)
Neutrophils Relative %: 72 %
Platelets: 62 10*3/uL — ABNORMAL LOW (ref 150–400)
RBC: 2.31 MIL/uL — ABNORMAL LOW (ref 4.22–5.81)
RDW: 12.5 % (ref 11.5–15.5)
WBC: 2.3 10*3/uL — ABNORMAL LOW (ref 4.0–10.5)
nRBC: 0 % (ref 0.0–0.2)

## 2018-11-15 LAB — POC OCCULT BLOOD, ED: Fecal Occult Bld: POSITIVE — AB

## 2018-11-15 LAB — C-REACTIVE PROTEIN: CRP: 32.5 mg/dL — ABNORMAL HIGH (ref <1.0)

## 2018-11-15 LAB — COMPREHENSIVE METABOLIC PANEL
ALT: 23 U/L (ref 0–44)
AST: 17 U/L (ref 15–41)
Albumin: 2.2 g/dL — ABNORMAL LOW (ref 3.5–5.0)
Alkaline Phosphatase: 42 U/L (ref 38–126)
Anion gap: 17 — ABNORMAL HIGH (ref 5–15)
BUN: 42 mg/dL — ABNORMAL HIGH (ref 8–23)
CO2: 22 mmol/L (ref 22–32)
Calcium: 7.3 mg/dL — ABNORMAL LOW (ref 8.9–10.3)
Chloride: 95 mmol/L — ABNORMAL LOW (ref 98–111)
Creatinine, Ser: 6.71 mg/dL — ABNORMAL HIGH (ref 0.61–1.24)
GFR calc Af Amer: 9 mL/min — ABNORMAL LOW (ref >60)
GFR calc non Af Amer: 7 mL/min — ABNORMAL LOW (ref >60)
Glucose, Bld: 291 mg/dL — ABNORMAL HIGH (ref 70–99)
Potassium: 3.4 mmol/L — ABNORMAL LOW (ref 3.5–5.1)
Sodium: 134 mmol/L — ABNORMAL LOW (ref 135–145)
Total Bilirubin: 0.8 mg/dL (ref 0.3–1.2)
Total Protein: 5.5 g/dL — ABNORMAL LOW (ref 6.5–8.1)

## 2018-11-15 LAB — TROPONIN I (HIGH SENSITIVITY)
Troponin I (High Sensitivity): 79 ng/L — ABNORMAL HIGH (ref <18)
Troponin I (High Sensitivity): 63 ng/L — ABNORMAL HIGH (ref <18)

## 2018-11-15 LAB — LACTATE DEHYDROGENASE: LDH: 340 U/L — ABNORMAL HIGH (ref 98–192)

## 2018-11-15 LAB — GLUCOSE, CAPILLARY: Glucose-Capillary: 325 mg/dL — ABNORMAL HIGH (ref 70–99)

## 2018-11-15 LAB — LACTIC ACID, PLASMA
Lactic Acid, Venous: 0.8 mmol/L (ref 0.5–1.9)
Lactic Acid, Venous: 0.7 mmol/L (ref 0.5–1.9)

## 2018-11-15 LAB — PROCALCITONIN: Procalcitonin: 6.25 ng/mL

## 2018-11-15 LAB — MRSA PCR SCREENING: MRSA by PCR: NEGATIVE

## 2018-11-15 LAB — SARS CORONAVIRUS 2 BY RT PCR (HOSPITAL ORDER, PERFORMED IN ~~LOC~~ HOSPITAL LAB): SARS Coronavirus 2: POSITIVE — AB

## 2018-11-15 LAB — D-DIMER, QUANTITATIVE: D-Dimer, Quant: 6.01 ug/mL-FEU — ABNORMAL HIGH (ref 0.00–0.50)

## 2018-11-15 LAB — TRIGLYCERIDES: Triglycerides: 153 mg/dL — ABNORMAL HIGH (ref <150)

## 2018-11-15 LAB — FIBRINOGEN: Fibrinogen: 750 mg/dL — ABNORMAL HIGH (ref 210–475)

## 2018-11-15 LAB — FERRITIN: Ferritin: 6358 ng/mL — ABNORMAL HIGH (ref 24–336)

## 2018-11-15 MED ORDER — RENA-VITE PO TABS
1.0000 | ORAL_TABLET | Freq: Every day | ORAL | Status: DC
  Administered 2018-11-15 – 2018-11-16 (×2): 1 via ORAL
  Filled 2018-11-15 (×3): qty 1

## 2018-11-15 MED ORDER — INSULIN ASPART 100 UNIT/ML ~~LOC~~ SOLN
0.0000 [IU] | Freq: Every day | SUBCUTANEOUS | Status: DC
  Administered 2018-11-15: 4 [IU] via SUBCUTANEOUS

## 2018-11-15 MED ORDER — DOCUSATE SODIUM 100 MG PO CAPS: 100.0000 mg | ORAL_CAPSULE | Freq: Every day | ORAL | Status: DC | PRN

## 2018-11-15 MED ORDER — SODIUM CHLORIDE 0.9 % IV SOLN
250.0000 mL | INTRAVENOUS | Status: DC | PRN
  Administered 2018-11-16: 22:00:00 250 mL via INTRAVENOUS

## 2018-11-15 MED ORDER — ALBUTEROL SULFATE (2.5 MG/3ML) 0.083% IN NEBU: 2.5000 mg | INHALATION_SOLUTION | Freq: Four times a day (QID) | RESPIRATORY_TRACT | Status: DC

## 2018-11-15 MED ORDER — ONDANSETRON HCL 4 MG/2ML IJ SOLN
4.0000 mg | Freq: Four times a day (QID) | INTRAMUSCULAR | Status: DC | PRN
  Administered 2018-11-16: 4 mg via INTRAVENOUS
  Filled 2018-11-15: qty 2

## 2018-11-15 MED ORDER — METHYLPREDNISOLONE SODIUM SUCC 40 MG IJ SOLR
40.0000 mg | Freq: Once | INTRAMUSCULAR | Status: AC
  Administered 2018-11-15: 40 mg via INTRAVENOUS
  Filled 2018-11-15: qty 1

## 2018-11-15 MED ORDER — IOHEXOL 350 MG/ML SOLN
100.0000 mL | Freq: Once | INTRAVENOUS | Status: AC | PRN
  Administered 2018-11-15: 100 mL via INTRAVENOUS

## 2018-11-15 MED ORDER — ACYCLOVIR 400 MG PO TABS
200.0000 mg | ORAL_TABLET | ORAL | Status: DC
  Administered 2018-11-16: 200 mg via ORAL
  Filled 2018-11-15: qty 0.5

## 2018-11-15 MED ORDER — SODIUM CHLORIDE 0.9% FLUSH
3.0000 mL | Freq: Two times a day (BID) | INTRAVENOUS | Status: DC
  Administered 2018-11-15: 3 mL via INTRAVENOUS
  Administered 2018-11-16: 10 mL via INTRAVENOUS
  Administered 2018-11-16: 3 mL via INTRAVENOUS

## 2018-11-15 MED ORDER — CHLORHEXIDINE GLUCONATE CLOTH 2 % EX PADS
6.0000 | MEDICATED_PAD | Freq: Every day | CUTANEOUS | Status: DC
  Administered 2018-11-16: 17:00:00 6 via TOPICAL

## 2018-11-15 MED ORDER — ACETAMINOPHEN 325 MG PO TABS
650.0000 mg | ORAL_TABLET | Freq: Four times a day (QID) | ORAL | Status: DC | PRN
  Administered 2018-11-15 – 2018-11-17 (×2): 650 mg via ORAL
  Filled 2018-11-15 (×2): qty 2

## 2018-11-15 MED ORDER — PRAVASTATIN SODIUM 10 MG PO TABS
10.0000 mg | ORAL_TABLET | Freq: Every day | ORAL | Status: DC
  Administered 2018-11-15 – 2018-11-17 (×3): 10 mg via ORAL
  Filled 2018-11-15 (×3): qty 1

## 2018-11-15 MED ORDER — INSULIN ASPART 100 UNIT/ML ~~LOC~~ SOLN
0.0000 [IU] | Freq: Three times a day (TID) | SUBCUTANEOUS | Status: DC
  Administered 2018-11-16 (×2): 9 [IU] via SUBCUTANEOUS

## 2018-11-15 MED ORDER — ONDANSETRON HCL 4 MG PO TABS: 4.0000 mg | ORAL_TABLET | Freq: Four times a day (QID) | ORAL | Status: DC | PRN

## 2018-11-15 MED ORDER — AMITRIPTYLINE HCL 10 MG PO TABS
10.0000 mg | ORAL_TABLET | Freq: Every day | ORAL | Status: DC
  Administered 2018-11-15 – 2018-11-16 (×2): 10 mg via ORAL
  Filled 2018-11-15 (×3): qty 1

## 2018-11-15 MED ORDER — LINAGLIPTIN 5 MG PO TABS
5.0000 mg | ORAL_TABLET | Freq: Every day | ORAL | Status: DC
  Administered 2018-11-15: 5 mg via ORAL
  Filled 2018-11-15: qty 1

## 2018-11-15 MED ORDER — POLYETHYLENE GLYCOL 3350 17 G PO PACK
17.0000 g | PACK | Freq: Every day | ORAL | Status: DC
  Filled 2018-11-15: qty 1

## 2018-11-15 MED ORDER — ALBUTEROL SULFATE HFA 108 (90 BASE) MCG/ACT IN AERS
10.0000 | INHALATION_SPRAY | Freq: Once | RESPIRATORY_TRACT | Status: DC
  Filled 2018-11-15: qty 6.7

## 2018-11-15 MED ORDER — BUTALBITAL-APAP-CAFFEINE 50-325-40 MG PO TABS: 1.0000 | ORAL_TABLET | ORAL | Status: DC | PRN

## 2018-11-15 MED ORDER — MIDODRINE HCL 5 MG PO TABS
10.0000 mg | ORAL_TABLET | ORAL | Status: DC
  Administered 2018-11-16: 13:00:00 10 mg via ORAL
  Filled 2018-11-15: qty 2

## 2018-11-15 MED ORDER — SODIUM CHLORIDE 0.9% FLUSH: 3.0000 mL | INTRAVENOUS | Status: DC | PRN

## 2018-11-15 MED ORDER — METOCLOPRAMIDE HCL 10 MG PO TABS
5.0000 mg | ORAL_TABLET | ORAL | Status: DC
  Administered 2018-11-17: 5 mg via ORAL

## 2018-11-15 MED ORDER — METOCLOPRAMIDE HCL 10 MG PO TABS: 5.0000 mg | ORAL_TABLET | ORAL | Status: DC

## 2018-11-15 MED ORDER — ENSURE ENLIVE PO LIQD: 237.0000 mL | ORAL | Status: DC

## 2018-11-15 MED ORDER — AEROCHAMBER PLUS FLO-VU LARGE MISC: 1.0000 | Freq: Once | Status: DC

## 2018-11-15 MED ORDER — METHYLPREDNISOLONE SODIUM SUCC 125 MG IJ SOLR
60.0000 mg | Freq: Four times a day (QID) | INTRAMUSCULAR | Status: DC
  Administered 2018-11-15 – 2018-11-17 (×8): 60 mg via INTRAVENOUS
  Filled 2018-11-15 (×8): qty 2

## 2018-11-15 MED ORDER — HEPARIN SODIUM (PORCINE) 5000 UNIT/ML IJ SOLN
5000.0000 [IU] | Freq: Three times a day (TID) | INTRAMUSCULAR | Status: DC
  Filled 2018-11-15: qty 1

## 2018-11-15 MED ORDER — GABAPENTIN 100 MG PO CAPS
100.0000 mg | ORAL_CAPSULE | Freq: Three times a day (TID) | ORAL | Status: DC
  Administered 2018-11-15 – 2018-11-17 (×4): 100 mg via ORAL
  Filled 2018-11-15 (×7): qty 1

## 2018-11-15 MED ORDER — ALBUTEROL SULFATE HFA 108 (90 BASE) MCG/ACT IN AERS
2.0000 | INHALATION_SPRAY | Freq: Four times a day (QID) | RESPIRATORY_TRACT | Status: DC
  Administered 2018-11-16 – 2018-11-17 (×3): 2 via RESPIRATORY_TRACT
  Filled 2018-11-15: qty 6.7

## 2018-11-15 MED ORDER — METOCLOPRAMIDE HCL 10 MG PO TABS
5.0000 mg | ORAL_TABLET | Freq: Every day | ORAL | Status: DC
  Administered 2018-11-16: 5 mg via ORAL
  Filled 2018-11-15 (×2): qty 1

## 2018-11-15 MED ORDER — CALCIUM ACETATE (PHOS BINDER) 667 MG PO CAPS
1334.0000 mg | ORAL_CAPSULE | Freq: Three times a day (TID) | ORAL | Status: DC
  Administered 2018-11-16 (×2): 1334 mg via ORAL
  Administered 2018-11-17 (×2): 667 mg via ORAL
  Filled 2018-11-15 (×7): qty 2

## 2018-11-15 NOTE — ED Provider Notes (Signed)
Care assumed from A. Murray PA-C.  Please see her full H&P.  In short,  Mark [name]. is a 73 y.o. male presents for fever and shortness of breath.  He was diagnosed with COVID on 10/25/18 at an outside ED and was discharged home as he was asymptomatic at time of test.  He was admitted to [facility] unit on 11/06/2018 for 4 days secondary to hypoxia secondary to covid.  Previous work up from previous provider includes positive covid test, elevated d dimer to 6.01, thrombocytopenia, hemoglobin of 8, compared to 5 days ago when it was 10. Rectal exam was performed and no gross blood, no melena, unlikely GI bleed. CTA chest pending. Anticipate admission for hypoxia, covid.[name]   Physical Exam  BP (!) 120/57   Pulse (!) 106   Temp 98.7 F (37.1 C) (Rectal)   Resp 17   SpO2 98%   Physical Exam  PE: Constitutional: Patient is chronically ill-appearing, he is in no acute distress.  HENT: normocephalic Cardiovascular: normal rate and rhythm, distal pulses intact Pulmonary/Chest: Patient is tachypneic, he is speaking in full sentences.  SPO2 on 2 L is 98%.  Crackles heard in bilateral lung bases. Abdominal: soft and nontender Musculoskeletal: full ROM, no edema Neurological: alert with goal directed thinking Skin: warm and dry, no rash, no diaphoresis Psychiatric: normal mood and affect, normal behavior    ED Course/Procedures   Results for orders placed or performed during the hospital encounter of 11/23/2018 (from the past 24 hour(s))  SARS Coronavirus 2 [facility] order, Performed in [place] hospital lab) Nasopharyngeal Nasopharyngeal Swab     Status: Abnormal   Collection Time: 11/28/2018  1:39 PM   Specimen: Nasopharyngeal Swab  Result Value Ref Range   SARS Coronavirus 2 POSITIVE (A) NEGATIVE  Lactic acid, plasma     Status: None   Collection Time: 11/24/2018  1:39 PM  Result Value Ref Range   Lactic Acid, Venous 0.8 0.5 - 1.9 mmol/L  CBC with Differential     Status: Abnormal   Collection Time: 11/06/2018  1:45 PM  Result Value Ref Range   WBC 2.3 (L) 4.0 - 10.5 K/uL   RBC 2.31 (L) 4.22 - 5.81 MIL/uL   Hemoglobin 8.0 (L) 13.0 - 17.0 g/dL   HCT 24.1 (L) 39.0 - 52.0 %   MCV 104.3 (H) 80.0 - 100.0 fL   MCH 34.6 (H) 26.0 - 34.0 pg   MCHC 33.2 30.0 - 36.0 g/dL   RDW 12.5 11.5 - 15.5 %   Platelets 62 (L) 150 - 400 K/uL   nRBC 0.0 0.0 - 0.2 %   Neutrophils Relative % 72 %   Neutro Abs 1.6 (L) 1.7 - 7.7 K/uL   Lymphocytes Relative 25 %   Lymphs Abs 0.6 (L) 0.7 - 4.0 K/uL   Monocytes Relative 3 %   Monocytes Absolute 0.1 0.1 - 1.0 K/uL   Eosinophils Relative 0 %   Eosinophils Absolute 0.0 0.0 - 0.5 K/uL   Basophils Relative 0 %   Basophils Absolute 0.0 0.0 - 0.1 K/uL   Smear Review MORPHOLOGY UNREMARKABLE    Immature Granulocytes 0 %   Abs Immature Granulocytes 0.01 0.00 - 0.07 K/uL  D-dimer, quantitative     Status: Abnormal   Collection Time: 11/12/2018  1:45 PM  Result Value Ref Range   D-Dimer, Quant 6.01 (H) 0.00 - 0.50 ug/mL-FEU  Troponin I (High Sensitivity)     Status: Abnormal   Collection Time: 11/13/2018  1:45  PM  Result Value Ref Range   Troponin I (High Sensitivity) 79 (H) <18 ng/L  Comprehensive metabolic panel     Status: Abnormal   Collection Time: 11/29/2018  1:45 PM  Result Value Ref Range   Sodium 134 (L) 135 - 145 mmol/L   Potassium 3.4 (L) 3.5 - 5.1 mmol/L   Chloride 95 (L) 98 - 111 mmol/L   CO2 22 22 - 32 mmol/L   Glucose, Bld 291 (H) 70 - 99 mg/dL   BUN 42 (H) 8 - 23 mg/dL   Creatinine, Ser 6.71 (H) 0.61 - 1.24 mg/dL   Calcium 7.3 (L) 8.9 - 10.3 mg/dL   Total Protein 5.5 (L) 6.5 - 8.1 g/dL   Albumin 2.2 (L) 3.5 - 5.0 g/dL   AST 17 15 - 41 U/L   ALT 23 0 - 44 U/L   Alkaline Phosphatase 42 38 - 126 U/L   Total Bilirubin 0.8 0.3 - 1.2 mg/dL   GFR calc non Af Amer 7 (L) >60 mL/min   GFR calc Af Amer 9 (L) >60 mL/min   Anion gap 17 (H) 5 - 53     MDM   73 year old male presents with shortness of breath and fever.  He had recent  admission for COVID 8/5-8/9. Unfortunately he has continued to have worsening covid like symptoms, has been hypoix at home despite wearing 2L [place]. On arrival 85%, after being put on 2L he increased to 98%. He is covid positive today. Labs today show thrombocytopenia, which is chronic but worse today. He also has elevated dimer at 6.01. Given his tachycardia, shortness of breath and elevated D dimer CTA PE needed to rule out PE. Pt will need to be admitted to [facility] for covid as he is a dialysis pt.  Spoke with Dr. Laren Everts with hospitalist service who agrees to assume care of patient and bring into the hospital for further evaluation and management. CTA is still pending. He also asked for covid admission labs to be ordered. He will follow the results for imaging and labs.  Christophor J Jettie [name]. was evaluated in Emergency Department on 11/03/2018 for the symptoms described in the history of present illness. He was evaluated in the context of the global COVID-19 pandemic, which necessitated consideration that the patient might be at risk for infection with the SARS-CoV-2 virus that causes COVID-19. Institutional protocols and algorithms that pertain to the evaluation of patients at risk for COVID-19 are in a state of rapid change based on information released by regulatory bodies including the CDC and federal and state organizations. These policies and algorithms were followed during the patient's care in the ED.  This note was prepared using Dragon voice recognition software and may include unintentional dictation errors due to the inherent limitations of voice recognition software.       Cherre Robins, PA-C 11/23/2018 1752    Veryl Speak, MD 11/21/2018 Sharilyn Sites

## 2018-11-15 NOTE — ED Notes (Signed)
CT called and they said that they are getting ready to come and get him

## 2018-11-15 NOTE — ED Provider Notes (Signed)
[facility] EMERGENCY DEPARTMENT Provider Note   CSN: [id number] Arrival date & time: 11/05/2018  1154     History   Chief Complaint Chief Complaint  Patient presents with   Fever   Fatigue    HPI Mark Shew. is a 73 y.o. male.     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  Patient reports that Mark [name] was diagnosed with COVID-19 on 10-25-2018.  Mark [name] had an admission to [facility] from 8-5-8-9.  Patient reports that Mark [name] has not been well ever since then.  His primary complaint is general malaise and lack of appetite but denies nausea, vomiting or diarrhea.  Mark [name] reports daily fevers, T-max of 103 last fever this morning for which she took antipyretics.  Patient also reports shortness of breath with exertion and can barely transfer from bed to chair.  Mark [name] wears 2 L of oxygen at night and occasionally requires it during the day previously when Mark [name] was undergoing cancer treatment but does not typically require it.  Mark [name] denies any chest pain, hemoptysis, productive cough.  Mark [name] performs nebulized treatments at home.  Mark [name] did recently finished a course of steroids.  Past Medical History:  Diagnosis Date   Anemia    Arthritis    Bladder cancer ([place])    Chronic kidney disease    Diabetes mellitus without complication ([place])    Dyspnea    DOE   ESRD on hemodialysis ([place])    Started dialysis in 2014, gets HD as of 2019 in [place] w/ Dr Rolly Salter on MWF schedule.     GERD (gastroesophageal reflux disease)    HOH (hard of hearing)    Hypertension    IBS (irritable bowel syndrome)    Lymphoma ([place]) 09/27/2014   Neuropathy    RIGHT LEG   Stroke [facility])    TIA    Patient Active Problem List   Diagnosis Date Noted   COVID-19 virus infection 11/06/2018    Pancytopenia ([place]) 11/06/2018   S/P IVC filter 04/23/2018   Malignant neoplasm of urinary bladder ([place]) 02/07/2018   Respiratory failure with hypoxia ([place]) 01/17/2018   Acute respiratory failure with hypoxia ([place]) 01/12/2018   Acute respiratory failure ([place]) 01/12/2018   Symptomatic anemia 12/31/2017   Acute on chronic respiratory failure with hypoxemia ([place]) 12/31/2017   Hypoxia 12/25/2017   Pressure injury of skin 12/16/2017   Fluid overload 01/15/2017   UTI (urinary tract infection) 10/20/2016   Sepsis ([place]) 10/20/2016   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   Renal cyst, right, on-complex 07/06/2015   Ulcer of foot, chronic ([place]) 03/21/2015   History of bladder cancer 11/10/2014   Penile bleeding 10/12/2014   Dependence on renal dialysis ([place]) 08/18/2013   Arterial blood pressure decreased 08/18/2013   Leukemia ([place]) 08/18/2013   Retina disorder 08/18/2013   Breath shortness 08/18/2013   End-stage renal disease on hemodialysis ([place]) 12/23/2012   Absolute anemia 07/18/2012   HPTH (hyperparathyroidism) ([place]) 07/18/2012   Acidosis, metabolic 48/04/6551   Hyperparathyroidism ([place]) 07/18/2012   Abnormal presence of protein in urine 01/01/2012   HLD (hyperlipidemia) 12/25/2011   Diabetic retinopathy associated with type 2 diabetes mellitus ([place]) 10/26/1999   Essential (primary) hypertension 04/04/1999   Diabetes mellitus type 2, uncontrolled ([place]) 04/04/1999   Type 2 diabetes mellitus  with other diabetic kidney complication ([place]) 57/84/6962    Past Surgical History:  Procedure Laterality Date   BACK SURGERY  2007   CATARACT EXTRACTION W/PHACO Left 03/23/2016   Procedure: CATARACT EXTRACTION PHACO AND INTRAOCULAR LENS PLACEMENT ([place]);  Surgeon: Leandrew Koyanagi, MD;  Location: [facility] ORS;  Service: Ophthalmology;  Laterality: Left;  [place] 52.6 AP% 14.7 CDE 7.72 Fluid pack lot # [id number]   CATARACT EXTRACTION  W/PHACO Right 05/30/2016   Procedure: CATARACT EXTRACTION PHACO AND INTRAOCULAR LENS PLACEMENT ([place]);  Surgeon: Leandrew Koyanagi, MD;  Location: [facility] ORS;  Service: Ophthalmology;  Laterality: Right;  [place] 01:08 AP% 13.9 CDE 9.53  note: could not get IV in patient, so procedure done without anesthesia personell present, ok per Dr Wallace Going fluid pack lo t# [id number]   CIRCUMCISION     IVC FILTER INSERTION N/A 12/31/2017   Procedure: IVC FILTER INSERTION;  Surgeon: Algernon Huxley, MD;  Location: [facility];  Service: Cardiovascular;  Laterality: N/A;   PERIPHERAL VASCULAR CATHETERIZATION Left 08/19/2015   Procedure: A/V Shuntogram/Fistulagram;  Surgeon: Algernon Huxley, MD;  Location: [facility];  Service: Cardiovascular;  Laterality: Left;   PERIPHERAL VASCULAR CATHETERIZATION N/A 08/19/2015   Procedure: A/V Shunt Intervention;  Surgeon: Algernon Huxley, MD;  Location: [facility];  Service: Cardiovascular;  Laterality: N/A;   PORTA CATH INSERTION N/A 01/02/2018   Procedure: PORTA CATH INSERTION;  Surgeon: Katha Cabal, MD;  Location: [place] [facility];  Service: Cardiovascular;  Laterality: N/A;        Home Medications    Prior to Admission medications   Medication Sig Start Date End Date Taking? Authorizing Provider  acyclovir (ZOVIRAX) 400 MG tablet Take 200 mg by mouth See admin instructions. Take 1/2 tablet (200 mg) by mouth on Tuesday, Thursday, Saturday nights (dialysis days) 09/03/18  Yes [provider]  albuterol (PROVENTIL) (2.5 MG/3ML) 0.083% nebulizer solution Take 2.5 mg by nebulization every 6 (six) hours as needed for wheezing or shortness of breath.   Yes [provider]  amitriptyline (ELAVIL) 10 MG tablet Take 10 mg by mouth at bedtime.  10/03/18  Yes [provider]  butalbital-acetaminophen-caffeine (FIORICET) 50-325-40 MG tablet Take 1 tablet by mouth every 4 (four) hours as needed for headache or migraine.  08/21/18  11/19/18 Yes [provider]  calcium acetate (PHOSLO) 667 MG capsule Take 1,334 mg by mouth 3 (three) times daily with meals.    Yes [provider]  docusate sodium (COLACE) 100 MG capsule Take 100 mg by mouth daily as needed for mild constipation.   Yes [provider]  fluticasone (FLONASE) 50 MCG/ACT nasal spray Place 1 spray into both nostrils daily as needed for allergies.  12/14/14  Yes [provider]  gabapentin (NEURONTIN) 100 MG capsule Take 1 capsule (100 mg total) by mouth 3 (three) times daily. Patient taking differently: Take 100-200 mg by mouth at bedtime.  03/02/16  Yes Edrick Kins, DPM  linagliptin (TRADJENTA) 5 MG TABS tablet Take 5 mg by mouth at bedtime.    Yes [provider]  lovastatin (MEVACOR) 20 MG tablet Take 20 mg by mouth at bedtime.  12/25/16  Yes [provider]  metoCLOPramide (REGLAN) 5 MG tablet Take 5 mg by mouth See admin instructions. Take one tablet (5 mg) by mouth twice daily on Sunday, Monday, Wednesday, Friday (non-dialysis days), take one tablet (5 mg) at night on Tuesday, Thursday, Saturday (dialysis days)   Yes [provider]  midodrine (PROAMATINE) 10 MG tablet Take 1 tablet (10 mg total) by mouth every dialysis (MWF). Patient taking differently: Take 10 mg by mouth See admin instructions. Take one tablet (10 mg) by mouth before dialysis - Tuesday, Thursday, Saturday 01/08/18  Yes Dustin Flock, MD  multivitamin (RENA-VIT) TABS tablet Take 1 tablet by mouth at bedtime.    Yes [provider]  Nutritional Supplements (ENSURE CLEAR) LIQD Take 237 mLs by mouth See admin instructions. Drink one can (237 mls) by mouth on Tuesday, Thursday, Saturday evenings (dialysis days)   Yes [provider]  OXYGEN Inhale 2 L into the lungs continuous.   Yes [provider]  polyethylene glycol (MIRALAX / GLYCOLAX) packet Take 17 g by mouth daily. Patient taking differently: Take 17  g by mouth daily as needed (constipation).  01/16/18  Yes Lavina Hamman, MD  Polyvinyl Alcohol-Povidone PF (REFRESH) 1.4-0.6 % SOLN Place 1 drop into both eyes 2 (two) times daily.    Yes [provider]  silver sulfADIAZINE (SILVADENE) 1 % cream APPLY 1 APPLICATION TOPICALY DAILY Patient taking differently: Apply 1 application topically daily as needed (wound care).  04/12/17  Yes Edrick Kins, DPM  sodium chloride (OCEAN) 0.65 % SOLN nasal spray Place 1 spray into both nostrils as needed for congestion. 01/08/18  Yes Dustin Flock, MD  Difluprednate (DUREZOL) 0.05 % EMUL Place 1 drop into the left eye 2 (two) times daily.     [provider]  glimepiride (AMARYL) 2 MG tablet Take 1 tablet (2 mg total) by mouth See admin instructions. Take 2mg  with meals for next 4days while on prednisone and then resume prior instructions of Take 1/2 tablet (1 mg) by mouth on Tuesday, Thursday, Saturday nights (dialysis days); may take 1 tablet (2 mg) on those nights for CBG >250 11/10/18   Domenic Polite, MD    Family History Family History  Problem Relation Age of Onset   Cancer Mother    Kidney cancer Neg Hx    Prostate cancer Neg Hx    Kidney failure Neg Hx    Bladder Cancer Neg Hx     Social History Social History   Tobacco Use   Smoking status: Former Smoker    Types: Cigarettes    Quit date: 05/19/2013    Years since quitting: 5.4   Smokeless tobacco: Never Used   Tobacco comment: quit  Substance Use Topics   Alcohol use: No    Alcohol/week: 0.0 standard drinks   Drug use: No     Allergies   Acyclovir and related, Heparin, Ibuprofen, Multivitamin [centrum], Daypro [oxaprozin], and Tape   Review of Systems Review of Systems  Constitutional: Positive for appetite change, chills and fever.  HENT: Negative for congestion and sore throat.   Eyes: Negative for visual disturbance.  Respiratory: Positive for shortness of breath. Negative for cough and chest  tightness.   Cardiovascular: Negative for chest pain, palpitations and leg swelling.  Gastrointestinal: Negative for abdominal pain, nausea and vomiting.  Genitourinary: Negative for dysuria and flank pain.  Musculoskeletal: Negative for back pain and myalgias.  Skin: Negative for rash.  Neurological: Negative for dizziness, syncope and light-headedness.     Physical Exam Updated Vital Signs BP (!) 130/59    Pulse (!) 106    Temp 98.7 F (37.1 C) (Rectal)    Resp 19    SpO2 98%   Physical Exam Vitals signs and nursing note reviewed.  Constitutional:  General: Mark [name] is not in acute distress.    Appearance: Mark [name] is well-developed. Mark [name] is not diaphoretic.     Comments: Awake, alert in no acute distress. Chronically ill appearing. Able to provide full history.  HENT:     Head: Normocephalic and atraumatic.     Mouth/Throat:     Mouth: Mucous membranes are moist.  Eyes:     Conjunctiva/sclera: Conjunctivae normal.     Pupils: Pupils are equal, round, and reactive to light.  Neck:     Musculoskeletal: Normal range of motion and neck supple.  Cardiovascular:     Rate and Rhythm: Normal rate and regular rhythm.     Heart sounds: S1 normal and S2 normal. No murmur.     Comments: Slight tachypnea. Speaks in full sentences. Pulmonary:     Effort: Pulmonary effort is normal.     Breath sounds: Rales present. No wheezing.     Comments: Coarse crackles bilateral lung bases. Abdominal:     General: There is no distension.     Palpations: Abdomen is soft.     Tenderness: There is no abdominal tenderness. There is no guarding.  Genitourinary:    Comments: Rectal exam performed with RN chaperone present.  Patient has no gross blood or melena.  Brown stool within rectal vault. Musculoskeletal: Normal range of motion.        General: No deformity.     Right lower leg: No edema.     Left lower leg: No edema.  Lymphadenopathy:     Cervical: No cervical adenopathy.  Skin:    General: Skin  is warm and dry.     Findings: No erythema or rash.  Neurological:     Mental Status: Mark [name] is alert.     Comments: Cranial nerves grossly intact. Patient moves extremities symmetrically and with good coordination.  Psychiatric:        Behavior: Behavior normal.        Thought Content: Thought content normal.        Judgment: Judgment normal.      ED Treatments / Results  Labs (all labs ordered are listed, but only abnormal results are displayed) Labs Reviewed  SARS CORONAVIRUS 2 (HOSPITAL ORDER, [facility]) - Abnormal; Notable for the following components:      Result Value   SARS Coronavirus 2 POSITIVE (*)    All other components within normal limits  CBC WITH DIFFERENTIAL/PLATELET - Abnormal; Notable for the following components:   WBC 2.3 (*)    RBC 2.31 (*)    Hemoglobin 8.0 (*)    HCT 24.1 (*)    MCV 104.3 (*)    MCH 34.6 (*)    Platelets 62 (*)    Neutro Abs 1.6 (*)    Lymphs Abs 0.6 (*)    All other components within normal limits  D-DIMER, QUANTITATIVE (NOT AT [facility]) - Abnormal; Notable for the following components:   D-Dimer, Quant 6.01 (*)    All other components within normal limits  COMPREHENSIVE METABOLIC PANEL - Abnormal; Notable for the following components:   Sodium 134 (*)    Potassium 3.4 (*)    Chloride 95 (*)    Glucose, Bld 291 (*)    BUN 42 (*)    Creatinine, Ser 6.71 (*)    Calcium 7.3 (*)    Total Protein 5.5 (*)    Albumin 2.2 (*)    GFR calc non Af Amer 7 (*)    GFR  calc Af Amer 9 (*)    Anion gap 17 (*)    All other components within normal limits  TROPONIN I (HIGH SENSITIVITY) - Abnormal; Notable for the following components:   Troponin I (High Sensitivity) 79 (*)    All other components within normal limits  CULTURE, BLOOD (ROUTINE X 2)  CULTURE, BLOOD (ROUTINE X 2)  LACTIC ACID, PLASMA  LACTIC ACID, PLASMA  TROPONIN I (HIGH SENSITIVITY)    EKG EKG Interpretation  Date/Time:  Friday November 15 2018  13:49:51 EDT Ventricular Rate:  100 PR Interval:    QRS Duration: 76 QT Interval:  366 QTC Calculation: 473 R Axis:   60 Text Interpretation:  Sinus tachycardia with irregular rate Low voltage, extremity leads Anteroseptal infarct, old Confirmed by Pattricia Boss [phone]) on 12/02/2018 2:39:08 PM   Radiology Dg Chest Portable 1 View  Result Date: 11/16/2018 CLINICAL DATA:  Shortness of breath. History of urinary bladder carcinoma EXAM: PORTABLE CHEST 1 VIEW COMPARISON:  November 07, 2018 chest radiograph and chest CT January 06, 2018 FINDINGS: There is fibrotic type change in the mid and lower lung zones. No consolidation. Heart is upper normal in size with pulmonary vascularity normal. There is adenopathy in the hila and mediastinal regions, also present on prior CT. There is aortic atherosclerosis. Port-A-Cath tip is at the cavoatrial junction. IMPRESSION: Areas of fibrotic change in the mid and lower lung zones without edema or consolidation. Multifocal adenopathy of uncertain etiology. Neoplastic adenopathy must be of concern. Stable cardiac silhouette. Port-A-Cath tip at cavoatrial junction. Aortic Atherosclerosis (ICD10-I70.0). Electronically Signed   By: Lowella Grip III M.D.   On: 12/02/2018 14:47    Procedures Procedures (including critical care time)  Medications Ordered in ED Medications  albuterol (VENTOLIN HFA) 108 (90 Base) MCG/ACT inhaler 10 puff (10 puffs Inhalation Refused 11/19/2018 1455)  AeroChamber Plus Flo-Vu Large MISC 1 each (has no administration in time range)  methylPREDNISolone sodium succinate (SOLU-MEDROL) 40 mg/mL injection 40 mg (has no administration in time range)     Initial Impression / Assessment and Plan / ED Course  I have reviewed the triage vital signs and the nursing notes.  Pertinent labs & imaging results that were available during my care of the patient were reviewed by me and considered in my medical decision making (see chart for  details).  Clinical Course as of Nov 14 1549  Fri Nov 15, 2018  1550 Suspect may be chronic 2/2 renal disease and demand ischemia.   Troponin I (High Sensitivity)(!): 79 [AM]    Clinical Course User Index [AM] Albesa Seen, PA-C       This is a 73 year old male past medical history of type 2 diabetes mellitus, ESRD on dialysis, hyperlipidemia, hypertension, CLL not on chronic treatment presenting for ongoing malaise, weakness, shortness of breath with exertion.  Mark [name] was found to be hypoxic on arrival not on O2.  Currently on 2 L Mark [name] is 98 to 100% at rest.  Suspect ongoing COVID-19 infection, will evaluate for secondary bacterial infection, pulmonary embolism, ACS as other causes of patient's symptoms.  Work-up demonstrating stable leukopenia of 2.3.  Mark [name] does have a drop in his hemoglobin in the last couple days.  Mark [name] is demonstrating pancytopenia, possibly related to his recent COVID-19 infection.  Hemoglobin down to 8 from 10.2 last week.  Rectal exam with no gross blood.  Chest x-ray stable with no evidence of acute or progressive infiltrate.  D-dimer elevated to 6.  This is more elevated  than his prior values when Mark [name] was admitted for COVID-19.  Will check CTPA to rule out pulmonary embolism.  Patient given Solu-Medrol and HFA inhaler given through spacer.  Mark [name] did refuse this as Mark [name] reports Mark [name] prefers nebulizers.  Will admit to [facility]. Kaitlyn Albrizze to pursue admission.   This is a supervised visit with Dr. Pattricia Boss. Evaluation, management, and discharge planning discussed with this attending physician.  Final Clinical Impressions(s) / ED Diagnoses   Final diagnoses:  Hypoxia  Generalized weakness  COVID-19    ED Discharge Orders    None       Tamala Julian 11/16/2018 1639    Pattricia Boss, MD 11/16/18 [phone]

## 2018-11-15 NOTE — H&P (Signed)
[facility]                                                                                    Patient Demographics  Mark [name], is a 73 y.o. male  CSN: [id number]  MRN: [id number]  DOB - Jul 28, 1945  Admit Date - 12/02/2018  Outpatient Primary MD for the patient is Albina Billet, MD   With History of -  Past Medical History:  Diagnosis Date  . Anemia   . Arthritis   . Bladder cancer ([place])   . Chronic kidney disease   . Diabetes mellitus without complication ([place])   . Dyspnea    DOE  . ESRD on hemodialysis [facility])    Started dialysis in 2014, gets HD as of 2019 in [place] w/ Dr Rolly Salter on MWF schedule.    [name] GERD (gastroesophageal reflux disease)   . HOH (hard of hearing)   . Hypertension   . IBS (irritable bowel syndrome)   . Lymphoma ([place]) 09/27/2014  . Neuropathy    RIGHT LEG  . Stroke [facility] [place])    TIA      Past Surgical History:  Procedure Laterality Date  . BACK SURGERY  2007  . CATARACT EXTRACTION W/PHACO Left 03/23/2016   Procedure: CATARACT EXTRACTION PHACO AND INTRAOCULAR LENS PLACEMENT (IOC);  Surgeon: Leandrew Koyanagi, MD;  Location: [facility] ORS;  Service: Ophthalmology;  Laterality: Left;  [place] 52.6 AP% 14.7 CDE 7.72 Fluid pack lot # [id number]  . CATARACT EXTRACTION W/PHACO Right 05/30/2016   Procedure: CATARACT EXTRACTION PHACO AND INTRAOCULAR LENS PLACEMENT ([place]);  Surgeon: Leandrew Koyanagi, MD;  Location: [facility] ORS;  Service: Ophthalmology;  Laterality: Right;  [place] 01:08 AP% 13.9 CDE 9.53  note: could not get IV in patient, so procedure done without anesthesia personell present, ok per Dr Wallace Going fluid pack lo t# [id number]  . CIRCUMCISION    . IVC FILTER INSERTION N/A 12/31/2017   Procedure: IVC FILTER INSERTION;  Surgeon: Algernon Huxley, MD;  Location: [place] CV LAB;  Service: Cardiovascular;  Laterality: N/A;  . PERIPHERAL VASCULAR CATHETERIZATION Left 08/19/2015   Procedure: A/V Shuntogram/Fistulagram;  Surgeon: Algernon Huxley,  MD;  Location: [place] CV LAB;  Service: Cardiovascular;  Laterality: Left;  . PERIPHERAL VASCULAR CATHETERIZATION N/A 08/19/2015   Procedure: A/V Shunt Intervention;  Surgeon: Algernon Huxley, MD;  Location: [place] CV LAB;  Service: Cardiovascular;  Laterality: N/A;  . PORTA CATH INSERTION N/A 01/02/2018   Procedure: PORTA CATH INSERTION;  Surgeon: Katha Cabal, MD;  Location: [place] CV LAB;  Service: Cardiovascular;  Laterality: N/A;    in for   Chief Complaint  Patient presents with  . Fever  . Fatigue     HPI  Mark [name]  is a 73 y.o. male, with past medical history significant for SLL/CLL, end-stage renal disease on hemodialysis TTS, anemia of chronic disease, thrombocytopenia who was recently diagnosed with COVID-19 on 7/24 and recently treated with Solu-Medrol on the recent admission, discharged on 8/9 denting now with weakness, generalized malaise, lack of appetite and fever at home, 103.  Reports orthopnea. Work-up in the emergency room showed elevated D-dimers at 6, repeat  COVID-19 test repeat was positive, other labs are pending.  Patient denies abdominal pain, nausea vomiting or diarrhea and dyspnea on exertion.  Patient denies chest pain hemoptysis or productive cough.    Review of Systems    In addition to the HPI above,   No Headache, No changes with Vision or hearing, No problems swallowing food or Liquids, No Chest pain,  No Abdominal pain, No Nausea or Vommitting, Bowel movements are regular, No Blood in stool or Urine, No dysuria, No new skin rashes or bruises, No new joints pains-aches,  No new weakness, tingling, numbness in any extremity, No recent weight gain or loss, No polyuria, polydypsia or polyphagia, All other systems were reviewed and were negative   Social History Social History   Tobacco Use  . Smoking status: Former Smoker    Types: Cigarettes    Quit date: 05/19/2013    Years since quitting: 5.4  . Smokeless  tobacco: Never Used  . Tobacco comment: quit  Substance Use Topics  . Alcohol use: No    Alcohol/week: 0.0 standard drinks     Family History Family History  Problem Relation Age of Onset  . Cancer Mother   . Kidney cancer Neg Hx   . Prostate cancer Neg Hx   . Kidney failure Neg Hx   . Bladder Cancer Neg Hx      Prior to Admission medications   Medication Sig Start Date End Date Taking? Authorizing Provider  acyclovir (ZOVIRAX) 400 MG tablet Take 200 mg by mouth See admin instructions. Take 1/2 tablet (200 mg) by mouth on Tuesday, Thursday, Saturday nights (dialysis days) 09/03/18  Yes [provider]  albuterol (PROVENTIL) (2.5 MG/3ML) 0.083% nebulizer solution Take 2.5 mg by nebulization every 6 (six) hours as needed for wheezing or shortness of breath.   Yes [provider]  amitriptyline (ELAVIL) 10 MG tablet Take 10 mg by mouth at bedtime.  10/03/18  Yes [provider]  butalbital-acetaminophen-caffeine (FIORICET) 50-325-40 MG tablet Take 1 tablet by mouth every 4 (four) hours as needed for headache or migraine.  08/21/18 11/19/18 Yes [provider]  calcium acetate (PHOSLO) 667 MG capsule Take 1,334 mg by mouth 3 (three) times daily with meals.    Yes [provider]  docusate sodium (COLACE) 100 MG capsule Take 100 mg by mouth daily as needed for mild constipation.   Yes [provider]  fluticasone (FLONASE) 50 MCG/ACT nasal spray Place 1 spray into both nostrils daily as needed for allergies.  12/14/14  Yes [provider]  gabapentin (NEURONTIN) 100 MG capsule Take 1 capsule (100 mg total) by mouth 3 (three) times daily. Patient taking differently: Take 100-200 mg by mouth at bedtime.  03/02/16  Yes Edrick Kins, DPM  linagliptin (TRADJENTA) 5 MG TABS tablet Take 5 mg by mouth at bedtime.    Yes [provider]  lovastatin (MEVACOR) 20 MG tablet Take 20 mg by mouth at bedtime.  12/25/16  Yes [provider]  metoCLOPramide (REGLAN) 5 MG tablet Take 5 mg by mouth See admin instructions. Take one tablet (5 mg) by mouth twice daily on Sunday, Monday, Wednesday, Friday (non-dialysis days), take one tablet (5 mg) at night on Tuesday, Thursday, Saturday (dialysis days)   Yes [provider]  midodrine (PROAMATINE) 10 MG tablet Take 1 tablet (10 mg total) by mouth every dialysis (MWF). Patient taking differently: Take 10 mg by mouth See admin instructions. Take one tablet (10 mg) by mouth  before dialysis - Tuesday, Thursday, Saturday 01/08/18  Yes Dustin Flock, MD  multivitamin (RENA-VIT) TABS tablet Take 1 tablet by mouth at bedtime.    Yes [provider]  Nutritional Supplements (ENSURE CLEAR) LIQD Take 237 mLs by mouth See admin instructions. Drink one can (237 mls) by mouth on Tuesday, Thursday, Saturday evenings (dialysis days)   Yes [provider]  OXYGEN Inhale 2 L into the lungs continuous.   Yes [provider]  polyethylene glycol (MIRALAX / GLYCOLAX) packet Take 17 g by mouth daily. Patient taking differently: Take 17 g by mouth daily as needed (constipation).  01/16/18  Yes Lavina Hamman, MD  Polyvinyl Alcohol-Povidone PF (REFRESH) 1.4-0.6 % SOLN Place 1 drop into both eyes 2 (two) times daily.    Yes [provider]  silver sulfADIAZINE (SILVADENE) 1 % cream APPLY 1 APPLICATION TOPICALY DAILY Patient taking differently: Apply 1 application topically daily as needed (wound care).  04/12/17  Yes Edrick Kins, DPM  sodium chloride (OCEAN) 0.65 % SOLN nasal spray Place 1 spray into both nostrils as needed for congestion. 01/08/18  Yes Dustin Flock, MD  Difluprednate (DUREZOL) 0.05 % EMUL Place 1 drop into the left eye 2 (two) times daily.     [provider]  glimepiride (AMARYL) 2 MG tablet Take 1 tablet (2 mg total) by mouth See admin instructions. Take 2mg  with meals for next 4days while on prednisone and then resume prior  instructions of Take 1/2 tablet (1 mg) by mouth on Tuesday, Thursday, Saturday nights (dialysis days); may take 1 tablet (2 mg) on those nights for CBG >250 11/10/18   Domenic Polite, MD    Allergies  Allergen Reactions  . Acyclovir And Related Other (See Comments)    Unknown reaction (tolerates low dose)  . Heparin Other (See Comments)    Excessive bleeding per pt.   . Ibuprofen Other (See Comments)    DUE TO DIALYSIS  . Multivitamin [Centrum] Other (See Comments)    DUE TO DIALYSIS  . Daypro [Oxaprozin] Swelling and Rash  . Tape Rash    Physical Exam  Vitals  Blood pressure 127/63, pulse (!) 106, temperature 98.7 F (37.1 C), temperature source Rectal, resp. rate (!) 26, SpO2 98 %.   General appearance, elderly, chronically ill, looks tired HEENT no jaundice or pallor pupils equal reactive to light Neck supple, no neck vein distention Lungs decreased breath sounds with mild scattered rhonchi Heart normal S1-S2, no murmurs gallops rubs Extremities no clubbing cyanosis, left arm AV Skin no rashes  Neuro grossly nonfocal patient alert awake, moving all extremities  Data Review  CBC Recent Labs  Lab 11/09/18 0420 11/10/18 0545 11/12/2018 1345  WBC 2.2* 2.4* 2.3*  HGB 10.5* 10.2* 8.0*  HCT 31.3* 30.2* 24.1*  PLT 97* 119* 62*  MCV 102.3* 102.0* 104.3*  MCH 34.3* 34.5* 34.6*  MCHC 33.5 33.8 33.2  RDW 12.6 12.6 12.5  LYMPHSABS 1.2 0.7 0.6*  MONOABS 0.1 0.1 0.1  EOSABS 0.0 0.0 0.0  BASOSABS 0.0 0.0 0.0   ------------------------------------------------------------------------------------------------------------------  Chemistries  Recent Labs  Lab 11/09/18 0420 11/10/18 0545 11/10/18 0830 12/01/2018 1345  NA 128* 131* 130* 134*  K 5.5* 5.4* 5.4* 3.4*  CL 88* 89* 89* 95*  CO2 19* 21* 20* 22  GLUCOSE 260* 276* 271* 291*  BUN 85* 73* 79* 42*  CREATININE 11.02* 7.67* 7.85* 6.71*  CALCIUM 8.0* 8.0* 7.9* 7.3*  AST 31 20  --  17  ALT 42 35  --  23  ALKPHOS 44 44   --  42  BILITOT 0.7 0.5  --  0.8   ------------------------------------------------------------------------------------------------------------------ estimated creatinine clearance is 10.4 mL/min (A) (by C-G formula based on SCr of 6.71 mg/dL (H)). ------------------------------------------------------------------------------------------------------------------ No results for input(s): TSH, T4TOTAL, T3FREE, THYROIDAB in the last 72 hours.  Invalid input(s): FREET3   Coagulation profile No results for input(s): INR, PROTIME in the last 168 hours. ------------------------------------------------------------------------------------------------------------------- Recent Labs    11/28/2018 1345  DDIMER 6.01*   -------------------------------------------------------------------------------------------------------------------  Cardiac Enzymes No results for input(s): CKMB, TROPONINI, MYOGLOBIN in the last 168 hours.  Invalid input(s): CK ------------------------------------------------------------------------------------------------------------------ Invalid input(s): POCBNP   ---------------------------------------------------------------------------------------------------------------  Urinalysis    Component Value Date/Time   COLORURINE YELLOW 01/12/2018 0129   APPEARANCEUR CLOUDY (A) 01/12/2018 0129   APPEARANCEUR Clear 01/06/2016 1433   LABSPEC 1.014 01/12/2018 0129   LABSPEC 1.014 10/20/2013 1835   PHURINE 5.0 01/12/2018 0129   GLUCOSEU 50 (A) 01/12/2018 0129   GLUCOSEU 150 mg/dL 10/20/2013 1835   HGBUR MODERATE (A) 01/12/2018 0129   BILIRUBINUR NEGATIVE 01/12/2018 0129   BILIRUBINUR Negative 01/06/2016 1433   BILIRUBINUR Negative 10/20/2013 1835   KETONESUR NEGATIVE 01/12/2018 0129   PROTEINUR >=300 (A) 01/12/2018 0129   NITRITE NEGATIVE 01/12/2018 0129   LEUKOCYTESUR NEGATIVE 01/12/2018 0129   LEUKOCYTESUR Negative 01/06/2016 1433   LEUKOCYTESUR 1+ 10/20/2013 1835     ----------------------------------------------------------------------------------------------------------------  Imaging results:   Dg Chest Portable 1 View  Result Date: 11/02/2018 CLINICAL DATA:  Shortness of breath. History of urinary bladder carcinoma EXAM: PORTABLE CHEST 1 VIEW COMPARISON:  November 07, 2018 chest radiograph and chest CT January 06, 2018 FINDINGS: There is fibrotic type change in the mid and lower lung zones. No consolidation. Heart is upper normal in size with pulmonary vascularity normal. There is adenopathy in the hila and mediastinal regions, also present on prior CT. There is aortic atherosclerosis. Port-A-Cath tip is at the cavoatrial junction. IMPRESSION: Areas of fibrotic change in the mid and lower lung zones without edema or consolidation. Multifocal adenopathy of uncertain etiology. Neoplastic adenopathy must be of concern. Stable cardiac silhouette. Port-A-Cath tip at cavoatrial junction. Aortic Atherosclerosis (ICD10-I70.0). Electronically Signed   By: Lowella Grip III M.D.   On: 11/13/2018 14:47   Dg Chest Port 1 View  Result Date: 11/07/2018 CLINICAL DATA:  COVID-19 patient EXAM: PORTABLE CHEST 1 VIEW COMPARISON:  11/06/2018, 03/22/2018 FINDINGS: Right-sided central venous port tip over the cavoatrial region. Stable heart size. No change in basilar airspace disease. No new foci of airspace disease. Bilateral hilar opacities, possible nodes. Slightly broadened appearance of the upper mediastinum. Aortic atherosclerosis. No pneumothorax. IMPRESSION: 1. Overall no significant change in bibasilar airspace disease as compared with 11/06/2018 2. Mildly prominent hilar opacities and borderline widening of the upper mediastinum, probable nodes. Electronically Signed   By: Donavan Foil M.D.   On: 11/07/2018 17:48   Dg Chest Portable 1 View  Result Date: 11/06/2018 CLINICAL DATA:  COVID-19 infection. Shortness of breath. EXAM: PORTABLE CHEST 1 VIEW COMPARISON:  Chest  x-ray dated 03/22/2018 and chest CT dated 01/06/2018 FINDINGS: Power port in place, unchanged with the tip just above the cavoatrial junction in good position. The heart size and pulmonary vascularity are normal. Increased mediastinal adenopathy since the prior chest x-ray. New bibasilar linear atelectasis. No focal infiltrates or effusions. No acute bone abnormality. IMPRESSION: 1. Increased mediastinal adenopathy. 2. New bibasilar atelectasis. 3. Lungs otherwise clear. 4.  Aortic Atherosclerosis (ICD10-I70.0). Electronically Signed   By: [name] M.D.  On: 11/06/2018 16:49    My personal review of EKG: Sinus arrhythmia at 100 bpm with old anteroseptal MI  Assessment & Plan  COVID-19 Acute hypoxemia with history of COVID-19 diagnosed 2 weeks ago Status post treatment with Solu-Medrol and MDIs Repeat COVID-19 test is positive Patient has fever and elevated D-dimers CT angiogram pending  End-stage renal disease on hemodialysis TTS Dr. 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 Rogue Bussing in [facility]  Pancytopenia Chronic Due to combination of CKD and CLL  Diabetes mellitus type 2 Hold glipizide and linagliptin ISS  Poor appetite Monitor   DVT Prophylaxis Heparin  AM Labs Ordered, also please review Full Orders  Family Communication: Called his wife, Mardene Celeste.  Code Status full  Disposition Plan: Home  Time spent in minutes : 43 minutes  Condition GUARDED   @SIGNATURE @

## 2018-11-15 NOTE — Progress Notes (Signed)
Paged [place] mid-level. Pt requesting Tylenol. Also made aware that pt refused second set of blood cultures.

## 2018-11-15 NOTE — ED Triage Notes (Signed)
Pt arrives to ED as a known covid + pt that was recently diagnosed home on Sunday. Pt is still having fevers and sob- pt arrives on room air and is at 85% pt placed on 2L [place]   Pt is  Dialysis pt.

## 2018-11-16 DIAGNOSIS — R0602 Shortness of breath: Secondary | ICD-10-CM

## 2018-11-16 LAB — BASIC METABOLIC PANEL
Anion gap: 18 — ABNORMAL HIGH (ref 5–15)
BUN: 58 mg/dL — ABNORMAL HIGH (ref 8–23)
CO2: 20 mmol/L — ABNORMAL LOW (ref 22–32)
Calcium: 6.9 mg/dL — ABNORMAL LOW (ref 8.9–10.3)
Chloride: 93 mmol/L — ABNORMAL LOW (ref 98–111)
Creatinine, Ser: 7.52 mg/dL — ABNORMAL HIGH (ref 0.61–1.24)
GFR calc Af Amer: 8 mL/min — ABNORMAL LOW (ref >60)
GFR calc non Af Amer: 6 mL/min — ABNORMAL LOW (ref >60)
Glucose, Bld: 470 mg/dL — ABNORMAL HIGH (ref 70–99)
Potassium: 4.1 mmol/L (ref 3.5–5.1)
Sodium: 131 mmol/L — ABNORMAL LOW (ref 135–145)

## 2018-11-16 LAB — GLUCOSE, CAPILLARY
Glucose-Capillary: 128 mg/dL — ABNORMAL HIGH (ref 70–99)
Glucose-Capillary: 181 mg/dL — ABNORMAL HIGH (ref 70–99)
Glucose-Capillary: 412 mg/dL — ABNORMAL HIGH (ref 70–99)
Glucose-Capillary: 475 mg/dL — ABNORMAL HIGH (ref 70–99)
Glucose-Capillary: 51 mg/dL — ABNORMAL LOW (ref 70–99)
Glucose-Capillary: 52 mg/dL — ABNORMAL LOW (ref 70–99)

## 2018-11-16 LAB — CBC
HCT: 24.8 % — ABNORMAL LOW (ref 39.0–52.0)
Hemoglobin: 8 g/dL — ABNORMAL LOW (ref 13.0–17.0)
MCH: 34.3 pg — ABNORMAL HIGH (ref 26.0–34.0)
MCHC: 32.3 g/dL (ref 30.0–36.0)
MCV: 106.4 fL — ABNORMAL HIGH (ref 80.0–100.0)
Platelets: 57 10*3/uL — ABNORMAL LOW (ref 150–400)
RBC: 2.33 MIL/uL — ABNORMAL LOW (ref 4.22–5.81)
RDW: 12.5 % (ref 11.5–15.5)
WBC: 1.5 10*3/uL — ABNORMAL LOW (ref 4.0–10.5)
nRBC: 0 % (ref 0.0–0.2)

## 2018-11-16 LAB — C-REACTIVE PROTEIN: CRP: 33.1 mg/dL — ABNORMAL HIGH (ref <1.0)

## 2018-11-16 LAB — FERRITIN: Ferritin: 6910 ng/mL — ABNORMAL HIGH (ref 24–336)

## 2018-11-16 LAB — D-DIMER, QUANTITATIVE: D-Dimer, Quant: 4.58 ug/mL-FEU — ABNORMAL HIGH (ref 0.00–0.50)

## 2018-11-16 MED ORDER — LORAZEPAM 2 MG/ML IJ SOLN: 1.0000 mg | INTRAMUSCULAR | Status: DC | PRN

## 2018-11-16 MED ORDER — CHLORHEXIDINE GLUCONATE CLOTH 2 % EX PADS
6.0000 | MEDICATED_PAD | Freq: Every day | CUTANEOUS | Status: DC
  Administered 2018-11-17: 6 via TOPICAL

## 2018-11-16 MED ORDER — DEXTROSE 50 % IV SOLN
INTRAVENOUS | Status: AC
  Administered 2018-11-16: 50 mL
  Filled 2018-11-16: qty 50

## 2018-11-16 MED ORDER — INSULIN ASPART 100 UNIT/ML ~~LOC~~ SOLN
5.0000 [IU] | Freq: Three times a day (TID) | SUBCUTANEOUS | Status: DC
  Administered 2018-11-16 (×2): 5 [IU] via SUBCUTANEOUS

## 2018-11-16 MED ORDER — DARBEPOETIN ALFA 60 MCG/0.3ML IJ SOSY
60.0000 ug | PREFILLED_SYRINGE | INTRAMUSCULAR | Status: DC
  Administered 2018-11-16: 15:00:00 60 ug via INTRAVENOUS
  Filled 2018-11-16 (×2): qty 0.3

## 2018-11-16 MED ORDER — CHLORHEXIDINE GLUCONATE CLOTH 2 % EX PADS: 6.0000 | MEDICATED_PAD | Freq: Every day | CUTANEOUS | Status: DC

## 2018-11-16 MED ORDER — INSULIN ASPART 100 UNIT/ML ~~LOC~~ SOLN
14.0000 [IU] | Freq: Once | SUBCUTANEOUS | Status: AC
  Administered 2018-11-16: 14 [IU] via SUBCUTANEOUS

## 2018-11-16 MED ORDER — ORAL CARE MOUTH RINSE
15.0000 mL | Freq: Two times a day (BID) | OROMUCOSAL | Status: DC
  Administered 2018-11-16 – 2018-11-17 (×2): 15 mL via OROMUCOSAL

## 2018-11-16 MED ORDER — ALBUMIN HUMAN 25 % IV SOLN
12.5000 g | Freq: Once | INTRAVENOUS | Status: AC
  Administered 2018-11-16: 12.5 g via INTRAVENOUS
  Filled 2018-11-16: qty 50

## 2018-11-16 MED ORDER — INSULIN GLARGINE 100 UNIT/ML ~~LOC~~ SOLN
20.0000 [IU] | Freq: Every day | SUBCUTANEOUS | Status: DC
  Administered 2018-11-16: 20 [IU] via SUBCUTANEOUS
  Filled 2018-11-16: qty 0.2

## 2018-11-16 MED ORDER — PRO-STAT SUGAR FREE PO LIQD
30.0000 mL | Freq: Two times a day (BID) | ORAL | Status: DC
  Administered 2018-11-16 – 2018-11-17 (×3): 30 mL via ORAL
  Filled 2018-11-16 (×3): qty 30

## 2018-11-16 MED ORDER — INSULIN GLARGINE 100 UNIT/ML ~~LOC~~ SOLN
30.0000 [IU] | Freq: Every day | SUBCUTANEOUS | Status: DC
  Administered 2018-11-17: 30 [IU] via SUBCUTANEOUS
  Filled 2018-11-16 (×2): qty 0.3

## 2018-11-16 MED ORDER — DOXERCALCIFEROL 4 MCG/2ML IV SOLN
0.5000 ug | INTRAVENOUS | Status: DC
  Administered 2018-11-16: 0.5 ug via INTRAVENOUS
  Filled 2018-11-16 (×2): qty 2

## 2018-11-16 MED ORDER — INSULIN ASPART 100 UNIT/ML ~~LOC~~ SOLN
0.0000 [IU] | SUBCUTANEOUS | Status: DC
  Administered 2018-11-16: 17:00:00 4 [IU] via SUBCUTANEOUS
  Administered 2018-11-17: 3 [IU] via SUBCUTANEOUS

## 2018-11-16 NOTE — Progress Notes (Signed)
CRITICAL VALUE ALERT  Critical Value:  CBG 475  Date & Time Notied:  11/16/18  0830  Provider Notified: Dr. Maylene Roes  Orders Received/Actions taken: 14 units of Novolog ordered.  See orders.   Irven [place], RN

## 2018-11-16 NOTE — Progress Notes (Signed)
PROGRESS NOTE    Mark J [employer].  WUJ:[id number] DOB: 1945/11/19 DOA: 11/23/2018 PCP: Mark Billet, MD     Brief Narrative:  Mark Toscano. is a 73 yo male with past medical history significant for SLL/CLL, ESRD on HD TTSa, anemia of chronic disease, thrombocytopenia who was diagnosed with COVID-19 on 7/24 and mated to the hospital between 8/5 to 8/9 now presents with worsening shortness of breath.  During his previous admission, he was treated with Solu-Medrol.    New events last 24 hours / Subjective: Blood sugars remain uncontrolled today, underwent hemodialysis this morning  Assessment & Plan:   Active Problems:   Pressure injury of skin   Shortness of breath   Acute on chronic hypoxemic respiratory failure -CTA chest negative for PE -Requires 2 L nasal cannula at baseline  COVID-19 -CTA chest revealed progressive a more focal patchy opacities throughout both lungs, most pronounced in the periphery of both lungs.  Compatible with clinical diagnosis of COVID-19 -Continue IV Solu-Medrol.  Discussed with Dr. Thereasa [name] and pharmacy, patient does not meet criteria for Remdesivir at this time. -Continue to trend inflammatory markers  Type 2 diabetes, poorly controlled -Lantus, NovoLog sliding scale increased today -Trend blood sugars every 4 hours  ESRD -Dialysis Tuesday, Thursday, Saturday -Nephrology following  Stage IV SLL/CLL Pancytopenia -Previously on ibrutinib, now surveillance only due to poor tolerance -Follows with oncology, Mark [name] in [place]  Demand ischemia -Troponin 79, 63 in setting of COVID-19, ESRD  Depression -Continue Elavil   In agreement with assessment of the pressure ulcer as below:  Pressure Injury 11/26/2018 Coccyx Medial;Lower Stage II -  Partial thickness loss of dermis presenting as a shallow open ulcer with a red, pink wound bed without slough. (Active)  11/02/2018 2130  Location: Coccyx  Location Orientation:  Medial;Lower  Staging: Stage II -  Partial thickness loss of dermis presenting as a shallow open ulcer with a red, pink wound bed without slough.  Wound Description (Comments):   Present on Admission: Yes         DVT prophylaxis: SCD Code Status: Partial code, DNI Family Communication: None Disposition Plan: Pending improvement   Consultants:   None  Procedures:   None   Antimicrobials:  Anti-infectives (From admission, onward)   Start     Dose/Rate Route Frequency Ordered Stop   11/16/18 1800  acyclovir (ZOVIRAX) tablet 200 mg    Note to Pharmacy: Take 1/2 tablet (200 mg) by mouth on Tuesday, Thursday, Saturday nights (dialysis days)     200 mg Oral Every T-Th-Sa (1800) 11/16/2018 2131          Objective: Vitals:   11/16/18 0800 11/16/18 1259 11/16/18 1300 11/16/18 1315  BP: 126/65  (!) 152/75   Pulse: 64  (!) 110   Resp: (!) 21  (!) 24 (!) (P) 28  Temp:  97.9 F (36.6 C)    TempSrc:  Oral  (P) Axillary  SpO2: 99%  (!) 85% (P) 98%  Weight:      Height:        Intake/Output Summary (Last 24 hours) at 11/16/2018 1418 Last data filed at 11/16/2018 0600 Gross per 24 hour  Intake 340 ml  Output -  Net 340 ml   Filed Weights   11/28/2018 2130  Weight: 82 kg    Examination:  General exam: Appears calm Respiratory system: Clear to auscultation. + Dry cough Cardiovascular system: S1 & S2 heard, RRR. No murmurs. No pedal edema. Gastrointestinal system:  Abdomen is nondistended, soft and nontender. Normal bowel sounds heard. Central nervous system: Alert and oriented. No focal neurological deficits. Speech clear.  Extremities: Symmetric in appearance  Skin: No rashes, lesions or ulcers on exposed skin  Psychiatry: Judgement and insight appear normal. Mood & affect appropriate.   Data Reviewed: I have personally reviewed following labs and imaging studies  CBC: Recent Labs  Lab 11/10/18 0545 12/01/2018 1345 11/16/18 0325  WBC 2.4* 2.3* 1.5*  NEUTROABS 1.5*  1.6*  --   HGB 10.2* 8.0* 8.0*  HCT 30.2* 24.1* 24.8*  MCV 102.0* 104.3* 106.4*  PLT 119* 62* 57*   Basic Metabolic Panel: Recent Labs  Lab 11/10/18 0545 11/10/18 0830 11/16/2018 1345 11/16/18 0325  NA 131* 130* 134* 131*  K 5.4* 5.4* 3.4* 4.1  CL 89* 89* 95* 93*  CO2 21* 20* 22 20*  GLUCOSE 276* 271* 291* 470*  BUN 73* 79* 42* 58*  CREATININE 7.67* 7.85* 6.71* 7.52*  CALCIUM 8.0* 7.9* 7.3* 6.9*   GFR: Estimated Creatinine Clearance: 9.3 mL/min (A) (by C-G formula based on SCr of 7.52 mg/dL (H)). Liver Function Tests: Recent Labs  Lab 11/10/18 0545 11/27/2018 1345  AST 20 17  ALT 35 23  ALKPHOS 44 42  BILITOT 0.5 0.8  PROT 5.9* 5.5*  ALBUMIN 2.6* 2.2*   No results for input(s): LIPASE, AMYLASE in the last 168 hours. No results for input(s): AMMONIA in the last 168 hours. Coagulation Profile: No results for input(s): INR, PROTIME in the last 168 hours. Cardiac Enzymes: No results for input(s): CKTOTAL, CKMB, CKMBINDEX, TROPONINI in the last 168 hours. BNP (last 3 results) No results for input(s): PROBNP in the last 8760 hours. HbA1C: No results for input(s): HGBA1C in the last 72 hours. CBG: Recent Labs  Lab 11/10/18 0710 11/10/18 1112 11/08/2018 2135 11/16/18 0822 11/16/18 1257  GLUCAP 222* 254* 325* 475* 412*   Lipid Profile: Recent Labs    11/16/2018 1940  TRIG 153*   Thyroid Function Tests: No results for input(s): TSH, T4TOTAL, FREET4, T3FREE, THYROIDAB in the last 72 hours. Anemia Panel: Recent Labs    11/30/2018 1940 11/16/18 0933  FERRITIN 6,358* 6,910*   Sepsis Labs: Recent Labs  Lab 11/08/2018 1339 11/12/2018 1940 11/12/2018 1945  PROCALCITON  --  6.25  --   LATICACIDVEN 0.8  --  0.7    Recent Results (from the past 240 hour(s))  MRSA PCR Screening     Status: None   Collection Time: 11/07/18  1:55 AM   Specimen: Nasal Mucosa; Nasopharyngeal  Result Value Ref Range Status   MRSA by PCR NEGATIVE NEGATIVE Final    Comment:        The  GeneXpert MRSA Assay (FDA approved for NASAL specimens only), is one component of a comprehensive MRSA colonization surveillance program. It is not intended to diagnose MRSA infection nor to guide or monitor treatment for MRSA infections. Performed at [facility], [place] [address], [place], [place] [address]   Culture, blood (Routine X 2) w Reflex to ID Panel     Status: None   Collection Time: 11/09/18  4:20 AM   Specimen: BLOOD  Result Value Ref Range Status   Specimen Description BLOOD PORTA CATH  Final   Special Requests   Final    BOTTLES DRAWN AEROBIC AND ANAEROBIC Blood Culture adequate volume   Culture   Final    NO GROWTH 5 DAYS Performed at [facility], [place] [address], [place], [place]  [address]    Report Status 11/14/2018 FINAL  Final  Culture, blood (Routine X 2) w Reflex to ID Panel     Status: None   Collection Time: 11/09/18 10:37 AM   Specimen: BLOOD  Result Value Ref Range Status   Specimen Description BLOOD SITE NOT SPECIFIED  Final   Special Requests   Final    BOTTLES DRAWN AEROBIC ONLY Blood Culture adequate volume   Culture   Final    NO GROWTH 5 DAYS Performed at [facility], [address] [place], [place] [address]    Report Status 11/14/2018 FINAL  Final  Culture, blood (routine x 2)     Status: None (Preliminary result)   Collection Time: 11/04/2018  1:17 PM   Specimen: BLOOD  Result Value Ref Range Status   Specimen Description BLOOD SITE NOT SPECIFIED  Final   Special Requests   Final    BOTTLES DRAWN AEROBIC AND ANAEROBIC Blood Culture adequate volume   Culture   Final    NO GROWTH < 24 HOURS Performed at [facility], [place] [address] [place], [place] [address]    Report Status PENDING  Incomplete  SARS Coronavirus 2 [facility] order, Performed in [facility] hospital lab) Nasopharyngeal Nasopharyngeal Swab     Status: Abnormal   Collection Time: 11/09/2018  1:39 PM   Specimen: Nasopharyngeal Swab  Result Value Ref  Range Status   SARS Coronavirus 2 POSITIVE (A) NEGATIVE Final    Comment: CRITICAL RESULT CALLED TO, READ BACK BY AND VERIFIED WITH: Benard Halsted, ED RN AT 1450 ON 11/22/2018 BY C. JESSUP, MT. (NOTE) If result is NEGATIVE SARS-CoV-2 target nucleic acids are NOT DETECTED. The SARS-CoV-2 RNA is generally detectable in upper and lower  respiratory specimens during the acute phase of infection. The lowest  concentration of SARS-CoV-2 viral copies this assay can detect is 250  copies / mL. A negative result does not preclude SARS-CoV-2 infection  and should not be used as the sole basis for treatment or other  patient management decisions.  A negative result may occur with  improper specimen collection / handling, submission of specimen other  than nasopharyngeal swab, presence of viral mutation(s) within the  areas targeted by this assay, and inadequate number of viral copies  (<250 copies / mL). A negative result must be combined with clinical  observations, patient history, and epidemiological information. If result is POSITIVE SARS-CoV-2 target nucl eic acids are DETECTED. The SARS-CoV-2 RNA is generally detectable in upper and lower  respiratory specimens during the acute phase of infection.  Positive  results are indicative of active infection with SARS-CoV-2.  Clinical  correlation with patient history and other diagnostic information is  necessary to determine patient infection status.  Positive results do  not rule out bacterial infection or co-infection with other viruses. If result is PRESUMPTIVE POSTIVE SARS-CoV-2 nucleic acids MAY BE PRESENT.   A presumptive positive result was obtained on the submitted specimen  and confirmed on repeat testing.  While 2019 novel coronavirus  (SARS-CoV-2) nucleic acids may be present in the submitted sample  additional confirmatory testing may be necessary for epidemiological  and / or clinical management purposes  to differentiate between   SARS-CoV-2 and other Sarbecovirus currently known to infect humans.  If clinically indicated additional testing with an alternate test   methodology [phone]) is advised. The SARS-CoV-2 RNA is generally  detectable in upper and lower respiratory specimens during the acute  phase of infection.  The expected result is Negative. Fact Sheet for Patients:  StrictlyIdeas.no Fact Sheet for Healthcare Providers: BankingDealers.co.za This test is not yet approved or cleared by the [place] FDA and has been authorized for detection and/or diagnosis of SARS-CoV-2 by FDA under an Emergency Use Authorization (EUA).  This EUA will remain in effect (meaning this test can be used) for the duration of the COVID-19 declaration under Section 564(b)(1) of the Act, 21 U.S.C. section 360bbb-3(b)(1), unless the authorization is terminated or revoked sooner. Performed at [facility], [place] [address] [place], [place] [address]   MRSA PCR Screening     Status: None   Collection Time: 11/12/2018  9:33 PM   Specimen: Nasal Mucosa; Nasopharyngeal  Result Value Ref Range Status   MRSA by PCR NEGATIVE NEGATIVE Final    Comment:        The GeneXpert MRSA Assay (FDA approved for NASAL specimens only), is one component of a comprehensive MRSA colonization surveillance program. It is not intended to diagnose MRSA infection nor to guide or monitor treatment for MRSA infections. Performed at [facility], [place] [address] [place], [place] [address]       Radiology Studies: Ct Angio Chest Pe W/cm &/or Wo Cm  Result Date: 11/14/2018 CLINICAL DATA:  Fevers and shortness of breath. Recently diagnosed COVID-19. Small B-cell lymphoma. EXAM: CT ANGIOGRAPHY CHEST WITH CONTRAST TECHNIQUE: Multidetector CT imaging of the chest was performed using the standard protocol during bolus administration of intravenous contrast. Multiplanar CT image reconstructions and  MIPs were obtained to evaluate the vascular anatomy. CONTRAST:  11mL OMNIPAQUE IOHEXOL 350 MG/ML SOLN COMPARISON:  01/06/2018. FINDINGS: Cardiovascular: Atheromatous calcifications, including the coronary arteries and aorta. Normal sized heart. The main pulmonary artery remains minimally enlarged with a transverse diameter of 3.0 cm on image number 232 series 7. No pulmonary artery oral filling defects. Mediastinum/Nodes: Multiple enlarged mediastinal and bilateral hilar lymph nodes with progression. A previously demonstrated 2.1 cm short axis subcarinal node has a short axis diameter of 3.3 cm on image number 86 series 5. A previously demonstrated 1.5 cm short axis prevascular node has a short axis diameter of 1.9 cm on image number 67 series 5. A previously demonstrated 2.3 cm short axis AP window lymph node has a short axis diameter of 3.2 cm on image number 67 series 5. Interval confluent adenopathy encasing the central bronchi bilaterally. Mildly prominent right axillary nodes without significant change. The largest has a short axis diameter of 1.3 cm on image number 50 series 5. Bilateral supraclavicular and lower neck adenopathy is again demonstrated. A previously demonstrated 2.6 cm short axis left supraclavicular node as a corresponding diameter of 2.6 cm on image number 12 series 5. Lungs/Pleura: Progressive and more focal patchy opacities throughout both lungs. Most pronounced in the periphery of both lungs. Small right pleural effusion, with improvement. Minimal left pleural effusion, improved. Upper Abdomen: No acute abnormality. Musculoskeletal: Thoracic and lower cervical spine degenerative changes. Sternomanubrial degenerative changes. Review of the MIP images confirms the above findings. IMPRESSION: 1. No pulmonary emboli. 2. Progressive and more focal patchy opacities throughout both lungs, most pronounced in the periphery of both lungs. This is compatible with the clinical diagnosis of COVID-19.  3. Progressive mediastinal and bilateral hilar adenopathy, compatible with progressive lymphoma. 4. Small right pleural effusion and minimal left pleural effusion, improved. 5. Stable minimal enlargement of the main pulmonary artery, compatible with mild pulmonary arterial hypertension. 6. Calcific coronary artery and aortic atherosclerosis. Aortic  Atherosclerosis (ICD10-I70.0). Electronically Signed   By: Claudie Revering M.D.   On: 11/18/2018 23:06   Dg Chest Portable 1 View  Result Date: 11/08/2018 CLINICAL DATA:  Shortness of breath. History of urinary bladder carcinoma EXAM: PORTABLE CHEST 1 VIEW COMPARISON:  November 07, 2018 chest radiograph and chest CT January 06, 2018 FINDINGS: There is fibrotic type change in the mid and lower lung zones. No consolidation. Heart is upper normal in size with pulmonary vascularity normal. There is adenopathy in the hila and mediastinal regions, also present on prior CT. There is aortic atherosclerosis. Port-A-Cath tip is at the cavoatrial junction. IMPRESSION: Areas of fibrotic change in the mid and lower lung zones without edema or consolidation. Multifocal adenopathy of uncertain etiology. Neoplastic adenopathy must be of concern. Stable cardiac silhouette. Port-A-Cath tip at cavoatrial junction. Aortic Atherosclerosis (ICD10-I70.0). Electronically Signed   By: Lowella Grip III M.D.   On: 11/02/2018 14:47      Scheduled Meds: . acyclovir  200 mg Oral Q T,Th,Sat-1800  . AeroChamber Plus Flo-Vu Large  1 each Other Once  . albuterol  2 puff Inhalation Q6H  . amitriptyline  10 mg Oral QHS  . calcium acetate  1,334 mg Oral TID WC  . Chlorhexidine Gluconate Cloth  6 each Topical Q0600  . Chlorhexidine Gluconate Cloth  6 each Topical Q0600  . darbepoetin (ARANESP) injection - DIALYSIS  60 mcg Intravenous Q Sat-HD  . doxercalciferol  0.5 mcg Intravenous Q T,Th,Sa-HD  . feeding supplement (ENSURE ENLIVE)  237 mL Oral See admin instructions  . feeding supplement  (PRO-STAT SUGAR FREE 64)  30 mL Oral BID  . gabapentin  100 mg Oral TID  . heparin  5,000 Units Subcutaneous Q8H  . insulin aspart  0-5 Units Subcutaneous QHS  . insulin aspart  0-9 Units Subcutaneous TID WC  . insulin aspart  5 Units Subcutaneous TID WC  . insulin glargine  20 Units Subcutaneous Daily  . methylPREDNISolone (SOLU-MEDROL) injection  60 mg Intravenous Q6H  . metoCLOPramide  5 mg Oral QHS  . [START ON 12-11-2018] metoCLOPramide  5 mg Oral Once per day on Sun Mon Wed Fri  . midodrine  10 mg Oral Q T,Th,Sa-HD  . multivitamin  1 tablet Oral QHS  . polyethylene glycol  17 g Oral Daily  . pravastatin  10 mg Oral q1800  . sodium chloride flush  3 mL Intravenous Q12H   Continuous Infusions: . sodium chloride       LOS: 1 day      Time spent: 40 minutes   Dessa Phi, DO [facility] [email or web address] 11/16/2018, 2:18 PM

## 2018-11-16 NOTE — Consult Note (Signed)
[place]  Reason for Consultation: ESRD with provision of dialysis and management of assoc conditions Requesting Provider: Dr. Laren Everts  HPI: Mark [name]. is an 73 y.o. 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  He also has diabetes mellitus, hypertension, hyperlipidemia, cerebrovascular disease and end-stage renal disease-dialyzes at the [place] [facility] and is followed by central [employer] [employer].   He was diagnosed with COVID 7/24 and was hospitalized about a week ago (d/c 8/6) with fever, treated with supportive care and solumedrol.  Presented yesterday to ED with ongoing fever, dyspnea, malaise and weakness.  Due to ongoing symptoms and +D dimer CTA to r/o PE obtained.  No PE, +progressive and more patchy opacities (mostly peripheral) c/w COVID.  He was admitted and is currently in ICU due to lack of tele beds on the [place] floor.    PMH: Past Medical History:  Diagnosis Date  . Anemia   . Arthritis   . Bladder cancer ([place])   . Chronic kidney disease   . Diabetes mellitus without complication ([place])   . Dyspnea    DOE  . ESRD on hemodialysis [facility])    Started dialysis in 2014, gets HD as of 2019 in [place] w/ Dr Rolly Salter on MWF schedule.    [name] GERD (gastroesophageal reflux disease)   . HOH (hard of hearing)   . Hypertension   . IBS (irritable bowel syndrome)   . Lymphoma ([place]) 09/27/2014  . Neuropathy    RIGHT LEG  . Stroke [facility])    TIA   PSH: Past Surgical History:  Procedure Laterality Date  . BACK SURGERY  2007  . CATARACT EXTRACTION W/PHACO Left 03/23/2016   Procedure: CATARACT EXTRACTION PHACO AND INTRAOCULAR LENS PLACEMENT (IOC);  Surgeon: Leandrew Koyanagi, MD;  Location: [facility] ORS;  Service: Ophthalmology;  Laterality: Left;  [place] 52.6 AP% 14.7 CDE 7.72 Fluid pack lot # [id number]  . CATARACT EXTRACTION W/PHACO Right 05/30/2016    Procedure: CATARACT EXTRACTION PHACO AND INTRAOCULAR LENS PLACEMENT ([place]);  Surgeon: Leandrew Koyanagi, MD;  Location: [facility] ORS;  Service: Ophthalmology;  Laterality: Right;  [place] 01:08 AP% 13.9 CDE 9.53  note: could not get IV in patient, so procedure done without anesthesia personell present, ok per Dr Wallace Going fluid pack lo t# [id number]  . CIRCUMCISION    . IVC FILTER INSERTION N/A 12/31/2017   Procedure: IVC FILTER INSERTION;  Surgeon: Algernon Huxley, MD;  Location: [place] CV LAB;  Service: Cardiovascular;  Laterality: N/A;  . PERIPHERAL VASCULAR CATHETERIZATION Left 08/19/2015   Procedure: A/V Shuntogram/Fistulagram;  Surgeon: Algernon Huxley, MD;  Location: [place] CV LAB;  Service: Cardiovascular;  Laterality: Left;  . PERIPHERAL VASCULAR CATHETERIZATION N/A 08/19/2015   Procedure: A/V Shunt Intervention;  Surgeon: Algernon Huxley, MD;  Location: [place] CV LAB;  Service: Cardiovascular;  Laterality: N/A;  . PORTA CATH INSERTION N/A 01/02/2018   Procedure: PORTA CATH INSERTION;  Surgeon: Katha Cabal, MD;  Location: [place] CV LAB;  Service: Cardiovascular;  Laterality: N/A;      Past Medical History:  Diagnosis Date  . Anemia   . Arthritis   . Bladder cancer ([place])   . Chronic kidney disease   . Diabetes mellitus without complication ([place])   . Dyspnea    DOE  . ESRD on hemodialysis [facility])    Started dialysis in 2014, gets HD as of 2019 in [place] w/  Dr Rolly Salter on MWF schedule.    [name] GERD (gastroesophageal reflux disease)   . HOH (hard of hearing)   . Hypertension   . IBS (irritable bowel syndrome)   . Lymphoma ([place]) 09/27/2014  . Neuropathy    RIGHT LEG  . Stroke [facility])    TIA    Medications:  I have reviewed the patient's current medications.  Medications Prior to Admission  Medication Sig Dispense Refill  . acyclovir (ZOVIRAX) 400 MG tablet Take 200 mg by mouth See admin instructions. Take 1/2 tablet (200 mg) by mouth on Tuesday, Thursday,  Saturday nights (dialysis days)    . albuterol (PROVENTIL) (2.5 MG/3ML) 0.083% nebulizer solution Take 2.5 mg by nebulization every 6 (six) hours as needed for wheezing or shortness of breath.    [name] amitriptyline (ELAVIL) 10 MG tablet Take 10 mg by mouth at bedtime.     . butalbital-acetaminophen-caffeine (FIORICET) 50-325-40 MG tablet Take 1 tablet by mouth every 4 (four) hours as needed for headache or migraine.     . calcium acetate (PHOSLO) 667 MG capsule Take 1,334 mg by mouth 3 (three) times daily with meals.     . docusate sodium (COLACE) 100 MG capsule Take 100 mg by mouth daily as needed for mild constipation.    . fluticasone (FLONASE) 50 MCG/ACT nasal spray Place 1 spray into both nostrils daily as needed for allergies.     [name] gabapentin (NEURONTIN) 100 MG capsule Take 1 capsule (100 mg total) by mouth 3 (three) times daily. (Patient taking differently: Take 100-200 mg by mouth at bedtime. ) 90 capsule 0  . linagliptin (TRADJENTA) 5 MG TABS tablet Take 5 mg by mouth at bedtime.     . lovastatin (MEVACOR) 20 MG tablet Take 20 mg by mouth at bedtime.     . metoCLOPramide (REGLAN) 5 MG tablet Take 5 mg by mouth See admin instructions. Take one tablet (5 mg) by mouth twice daily on Sunday, Monday, Wednesday, Friday (non-dialysis days), take one tablet (5 mg) at night on Tuesday, Thursday, Saturday (dialysis days)    . midodrine (PROAMATINE) 10 MG tablet Take 1 tablet (10 mg total) by mouth every dialysis (MWF). (Patient taking differently: Take 10 mg by mouth See admin instructions. Take one tablet (10 mg) by mouth before dialysis - Tuesday, Thursday, Saturday)    . multivitamin (RENA-VIT) TABS tablet Take 1 tablet by mouth at bedtime.     . Nutritional Supplements (ENSURE CLEAR) LIQD Take 237 mLs by mouth See admin instructions. Drink one can (237 mls) by mouth on Tuesday, Thursday, Saturday evenings (dialysis days)    . OXYGEN Inhale 2 L into the lungs continuous.    . polyethylene glycol  (MIRALAX / GLYCOLAX) packet Take 17 g by mouth daily. (Patient taking differently: Take 17 g by mouth daily as needed (constipation). ) 14 each 0  . Polyvinyl Alcohol-Povidone PF (REFRESH) 1.4-0.6 % SOLN Place 1 drop into both eyes 2 (two) times daily.     . silver sulfADIAZINE (SILVADENE) 1 % cream APPLY 1 APPLICATION TOPICALY DAILY (Patient taking differently: Apply 1 application topically daily as needed (wound care). ) 50 g 1  . sodium chloride (OCEAN) 0.65 % SOLN nasal spray Place 1 spray into both nostrils as needed for congestion.  0  . Difluprednate (DUREZOL) 0.05 % EMUL Place 1 drop into the left eye 2 (two) times daily.     [name] glimepiride (AMARYL) 2 MG tablet Take 1 tablet (2 mg total) by mouth See  admin instructions. Take 2mg  with meals for next 4days while on prednisone and then resume prior instructions of Take 1/2 tablet (1 mg) by mouth on Tuesday, Thursday, Saturday nights (dialysis days); may take 1 tablet (2 mg) on those nights for CBG >250      ALLERGIES:   Allergies  Allergen Reactions  . Acyclovir And Related Other (See Comments)    Unknown reaction (tolerates low dose)  . Heparin Other (See Comments)    Excessive bleeding per pt.   . Ibuprofen Other (See Comments)    DUE TO DIALYSIS  . Multivitamin [Centrum] Other (See Comments)    DUE TO DIALYSIS  . Daypro [Oxaprozin] Swelling and Rash  . Tape Rash    FAM HX: Family History  Problem Relation Age of Onset  . Cancer Mother   . Kidney cancer Neg Hx   . Prostate cancer Neg Hx   . Kidney failure Neg Hx   . Bladder Cancer Neg Hx     Social History:   reports that he quit smoking about 5 years ago. His smoking use included cigarettes. He has never used smokeless tobacco. He reports that he does not drink alcohol or use drugs.  ROS: obtained per H&P and reviewed in HPI  Blood pressure 124/62, pulse 67, temperature 97.8 F (36.6 C), temperature source Oral, resp. rate 18, height 5\' 11"  (1.803 m), weight 82 kg, SpO2  100 %. PHYSICAL EXAM: In light of COVID mitigation I did not personally examine the patient but rather performed a thorough chart review and d/w primary providers   Results for orders placed or performed during the hospital encounter of 11/12/2018 (from the past 79 hour(s))  SARS Coronavirus 2 [facility] order, Performed in [facility] hospital lab) Nasopharyngeal Nasopharyngeal Swab     Status: Abnormal   Collection Time: 11/28/2018  1:39 PM   Specimen: Nasopharyngeal Swab  Result Value Ref Range   SARS Coronavirus 2 POSITIVE (A) NEGATIVE    Comment: CRITICAL RESULT CALLED TO, READ BACK BY AND VERIFIED WITH: Benard Halsted, ED RN AT 1450 ON 11/20/2018 BY C. JESSUP, MT. (NOTE) If result is NEGATIVE SARS-CoV-2 target nucleic acids are NOT DETECTED. The SARS-CoV-2 RNA is generally detectable in upper and lower  respiratory specimens during the acute phase of infection. The lowest  concentration of SARS-CoV-2 viral copies this assay can detect is 250  copies / mL. A negative result does not preclude SARS-CoV-2 infection  and should not be used as the sole basis for treatment or other  patient management decisions.  A negative result may occur with  improper specimen collection / handling, submission of specimen other  than nasopharyngeal swab, presence of viral mutation(s) within the  areas targeted by this assay, and inadequate number of viral copies  (<250 copies / mL). A negative result must be combined with clinical  observations, patient history, and epidemiological information. If result is POSITIVE SARS-CoV-2 target nucl eic acids are DETECTED. The SARS-CoV-2 RNA is generally detectable in upper and lower  respiratory specimens during the acute phase of infection.  Positive  results are indicative of active infection with SARS-CoV-2.  Clinical  correlation with patient history and other diagnostic information is  necessary to determine patient infection status.  Positive results do  not rule  out bacterial infection or co-infection with other viruses. If result is PRESUMPTIVE POSTIVE SARS-CoV-2 nucleic acids MAY BE PRESENT.   A presumptive positive result was obtained on the submitted specimen  and confirmed on repeat testing.  While 2019 novel coronavirus  (SARS-CoV-2) nucleic acids may be present in the submitted sample  additional confirmatory testing may be necessary for epidemiological  and / or clinical management purposes  to differentiate between  SARS-CoV-2 and other Sarbecovirus currently known to infect humans.  If clinically indicated additional testing with an alternate test   methodology [phone]) is advised. The SARS-CoV-2 RNA is generally  detectable in upper and lower respiratory specimens during the acute  phase of infection. The expected result is Negative. Fact Sheet for Patients:  StrictlyIdeas.no Fact Sheet for Healthcare Providers: BankingDealers.co.za This test is not yet approved or cleared by the [place] FDA and has been authorized for detection and/or diagnosis of SARS-CoV-2 by FDA under an Emergency Use Authorization (EUA).  This EUA will remain in effect (meaning this test can be used) for the duration of the COVID-19 declaration under Section 564(b)(1) of the Act, 21 U.S.C. section 360bbb-3(b)(1), unless the authorization is terminated or revoked sooner. Performed at [facility], [place] [address] [place], [place] [address]   Lactic acid, plasma     Status: None   Collection Time: 11/20/2018  1:39 PM  Result Value Ref Range   Lactic Acid, Venous 0.8 0.5 - 1.9 mmol/L    Comment: Performed at [place] [address] [place], [place] [address]  CBC with Differential     Status: Abnormal   Collection Time: 11/04/2018  1:45 PM  Result Value Ref Range   WBC 2.3 (L) 4.0 - 10.5 K/uL   RBC 2.31 (L) 4.22 - 5.81 MIL/uL   Hemoglobin 8.0 (L) 13.0 - 17.0 g/dL   HCT 24.1 (L) 39.0 - 52.0  %   MCV 104.3 (H) 80.0 - 100.0 fL   MCH 34.6 (H) 26.0 - 34.0 pg   MCHC 33.2 30.0 - 36.0 g/dL   RDW 12.5 11.5 - 15.5 %   Platelets 62 (L) 150 - 400 K/uL    Comment: REPEATED TO VERIFY PLATELET COUNT CONFIRMED BY SMEAR Immature Platelet Fraction may be clinically indicated, consider ordering this additional test [id number]    nRBC 0.0 0.0 - 0.2 %   Neutrophils Relative % 72 %   Neutro Abs 1.6 (L) 1.7 - 7.7 K/uL   Lymphocytes Relative 25 %   Lymphs Abs 0.6 (L) 0.7 - 4.0 K/uL   Monocytes Relative 3 %   Monocytes Absolute 0.1 0.1 - 1.0 K/uL   Eosinophils Relative 0 %   Eosinophils Absolute 0.0 0.0 - 0.5 K/uL   Basophils Relative 0 %   Basophils Absolute 0.0 0.0 - 0.1 K/uL   Smear Review MORPHOLOGY UNREMARKABLE    Immature Granulocytes 0 %   Abs Immature Granulocytes 0.01 0.00 - 0.07 K/uL    Comment: Performed at [facility], [place] [address], [place], [place] [address]  D-dimer, quantitative     Status: Abnormal   Collection Time: 11/14/2018  1:45 PM  Result Value Ref Range   D-Dimer, Quant 6.01 (H) 0.00 - 0.50 ug/mL-FEU    Comment: (NOTE) At the manufacturer cut-off of 0.50 ug/mL FEU, this assay has been documented to exclude PE with a sensitivity and negative predictive value of 97 to 99%.  At this time, this assay has not been approved by the FDA to exclude DVT/VTE. Results should be correlated with clinical presentation. Performed at [facility], [place] [address], [place], [place] [address]   Troponin I (High Sensitivity)     Status: Abnormal  Collection Time: 11/04/2018  1:45 PM  Result Value Ref Range   Troponin I (High Sensitivity) 79 (H) <18 ng/L    Comment: (NOTE) Elevated high sensitivity troponin I (hsTnI) values and significant  changes across serial measurements may suggest ACS but many other  chronic and acute conditions are known to elevate hsTnI results.  Refer to the "Links" section for chest pain algorithms and additional  guidance. Performed at  [facility], [place] [address], [place], [place] [address]   Comprehensive metabolic panel     Status: Abnormal   Collection Time: 11/20/2018  1:45 PM  Result Value Ref Range   Sodium 134 (L) 135 - 145 mmol/L   Potassium 3.4 (L) 3.5 - 5.1 mmol/L   Chloride 95 (L) 98 - 111 mmol/L   CO2 22 22 - 32 mmol/L   Glucose, Bld 291 (H) 70 - 99 mg/dL   BUN 42 (H) 8 - 23 mg/dL   Creatinine, Ser 6.71 (H) 0.61 - 1.24 mg/dL   Calcium 7.3 (L) 8.9 - 10.3 mg/dL   Total Protein 5.5 (L) 6.5 - 8.1 g/dL   Albumin 2.2 (L) 3.5 - 5.0 g/dL   AST 17 15 - 41 U/L   ALT 23 0 - 44 U/L   Alkaline Phosphatase 42 38 - 126 U/L   Total Bilirubin 0.8 0.3 - 1.2 mg/dL   GFR calc non Af Amer 7 (L) >60 mL/min   GFR calc Af Amer 9 (L) >60 mL/min   Anion gap 17 (H) 5 - 15    Comment: Performed at [facility], [place] [address], [place], [place] [address]  POC occult blood, ED Provider will collect     Status: Abnormal   Collection Time: 11/26/2018  4:43 PM  Result Value Ref Range   Fecal Occult Bld POSITIVE (A) NEGATIVE  Troponin I (High Sensitivity)     Status: Abnormal   Collection Time: 11/05/2018  7:40 PM  Result Value Ref Range   Troponin I (High Sensitivity) 63 (H) <18 ng/L    Comment: (NOTE) Elevated high sensitivity troponin I (hsTnI) values and significant  changes across serial measurements may suggest ACS but many other  chronic and acute conditions are known to elevate hsTnI results.  Refer to the "Links" section for chest pain algorithms and additional  guidance. Performed at [facility], [place] [address], [place], [place] [address]   Procalcitonin     Status: None   Collection Time: 11/08/2018  7:40 PM  Result Value Ref Range   Procalcitonin 6.25 ng/mL    Comment:        Interpretation: PCT > 2 ng/mL: Systemic infection (sepsis) is likely, unless other causes are known. (NOTE)       Sepsis PCT Algorithm           Lower Respiratory Tract                                      Infection PCT  Algorithm    ----------------------------     ----------------------------         PCT < 0.25 ng/mL                PCT < 0.10 ng/mL         Strongly encourage             Strongly discourage   discontinuation of antibiotics  initiation of antibiotics    ----------------------------     -----------------------------       PCT 0.25 - 0.50 ng/mL            PCT 0.10 - 0.25 ng/mL               OR       >80% decrease in PCT            Discourage initiation of                                            antibiotics      Encourage discontinuation           of antibiotics    ----------------------------     -----------------------------         PCT >= 0.50 ng/mL              PCT 0.26 - 0.50 ng/mL               AND       <80% decrease in PCT              Encourage initiation of                                             antibiotics       Encourage continuation           of antibiotics    ----------------------------     -----------------------------        PCT >= 0.50 ng/mL                  PCT > 0.50 ng/mL               AND         increase in PCT                  Strongly encourage                                      initiation of antibiotics    Strongly encourage escalation           of antibiotics                                     -----------------------------                                           PCT <= 0.25 ng/mL                                                 OR                                        >  80% decrease in PCT                                     Discontinue / Do not initiate                                             antibiotics Performed at [facility], [place] [address], [place], [place] [address]   Lactate dehydrogenase     Status: Abnormal   Collection Time: 12/02/2018  7:40 PM  Result Value Ref Range   LDH 340 (H) 98 - 192 U/L    Comment: Performed at [facility], [place] [address] [place], [place] [address]  Ferritin     Status: Abnormal    Collection Time: 11/22/2018  7:40 PM  Result Value Ref Range   Ferritin 6,358 (H) 24 - 336 ng/mL    Comment: Performed at [facility], [place] [address], [place], [place] [address]  Triglycerides     Status: Abnormal   Collection Time: 11/25/2018  7:40 PM  Result Value Ref Range   Triglycerides 153 (H) <150 mg/dL    Comment: Performed at [place] [address] [place], [place] [address]  Fibrinogen     Status: Abnormal   Collection Time: 11/18/2018  7:40 PM  Result Value Ref Range   Fibrinogen 750 (H) 210 - 475 mg/dL    Comment: Performed at [place] [address] [place], [place] [address]  C-reactive protein     Status: Abnormal   Collection Time: 12/01/2018  7:40 PM  Result Value Ref Range   CRP 32.5 (H) <1.0 mg/dL    Comment: Performed at [facility], [place] [address] [place], [place] [address]  Lactic acid, plasma     Status: None   Collection Time: 11/09/2018  7:45 PM  Result Value Ref Range   Lactic Acid, Venous 0.7 0.5 - 1.9 mmol/L    Comment: Performed at [place] [address], [place],  [address]  MRSA PCR Screening     Status: None   Collection Time: 11/04/2018  9:33 PM   Specimen: Nasal Mucosa; Nasopharyngeal  Result Value Ref Range   MRSA by PCR NEGATIVE NEGATIVE    Comment:        The GeneXpert MRSA Assay (FDA approved for NASAL specimens only), is one component of a comprehensive MRSA colonization surveillance program. It is not intended to diagnose MRSA infection nor to guide or monitor treatment for MRSA infections. Performed at [facility], [place] [address], [place], [place] [address]   Glucose, capillary     Status: Abnormal   Collection Time: 11/24/2018  9:35 PM  Result Value Ref Range   Glucose-Capillary 325 (H) 70 - 99 mg/dL  Basic metabolic panel     Status: Abnormal   Collection Time: 11/16/18  3:25 AM  Result Value Ref Range   Sodium 131 (L) 135 - 145 mmol/L   Potassium 4.1 3.5 - 5.1 mmol/L    Chloride 93 (L) 98 - 111 mmol/L   CO2 20 (L) 22 - 32 mmol/L   Glucose, Bld 470 (H) 70 - 99 mg/dL   BUN 58 (H) 8 - 23 mg/dL   Creatinine, Ser 7.52 (  H) 0.61 - 1.24 mg/dL   Calcium 6.9 (L) 8.9 - 10.3 mg/dL   GFR calc non Af Amer 6 (L) >60 mL/min   GFR calc Af Amer 8 (L) >60 mL/min   Anion gap 18 (H) 5 - 15    Comment: Performed at [place] [address], [place], [place] [address]  CBC     Status: Abnormal   Collection Time: 11/16/18  3:25 AM  Result Value Ref Range   WBC 1.5 (L) 4.0 - 10.5 K/uL   RBC 2.33 (L) 4.22 - 5.81 MIL/uL   Hemoglobin 8.0 (L) 13.0 - 17.0 g/dL   HCT 24.8 (L) 39.0 - 52.0 %   MCV 106.4 (H) 80.0 - 100.0 fL   MCH 34.3 (H) 26.0 - 34.0 pg   MCHC 32.3 30.0 - 36.0 g/dL   RDW 12.5 11.5 - 15.5 %   Platelets 57 (L) 150 - 400 K/uL    Comment: Immature Platelet Fraction may be clinically indicated, consider ordering this additional test [id number] CONSISTENT WITH PREVIOUS RESULT    nRBC 0.0 0.0 - 0.2 %    Comment: Performed at [facility], [place] [address], [place], [place] [address]    Ct Angio Chest Pe W/cm &/or Wo Cm  Result Date: 11/03/2018 CLINICAL DATA:  Fevers and shortness of breath. Recently diagnosed COVID-19. Small B-cell lymphoma. EXAM: CT ANGIOGRAPHY CHEST WITH CONTRAST TECHNIQUE: Multidetector CT imaging of the chest was performed using the standard protocol during bolus administration of intravenous contrast. Multiplanar CT image reconstructions and MIPs were obtained to evaluate the vascular anatomy. CONTRAST:  157mL OMNIPAQUE IOHEXOL 350 MG/ML SOLN COMPARISON:  01/06/2018. FINDINGS: Cardiovascular: Atheromatous calcifications, including the coronary arteries and aorta. Normal sized heart. The main pulmonary artery remains minimally enlarged with a transverse diameter of 3.0 cm on image number 232 series 7. No pulmonary artery oral filling defects. Mediastinum/Nodes: Multiple enlarged mediastinal and bilateral hilar lymph nodes with progression.  A previously demonstrated 2.1 cm short axis subcarinal node has a short axis diameter of 3.3 cm on image number 86 series 5. A previously demonstrated 1.5 cm short axis prevascular node has a short axis diameter of 1.9 cm on image number 67 series 5. A previously demonstrated 2.3 cm short axis AP window lymph node has a short axis diameter of 3.2 cm on image number 67 series 5. Interval confluent adenopathy encasing the central bronchi bilaterally. Mildly prominent right axillary nodes without significant change. The largest has a short axis diameter of 1.3 cm on image number 50 series 5. Bilateral supraclavicular and lower neck adenopathy is again demonstrated. A previously demonstrated 2.6 cm short axis left supraclavicular node as a corresponding diameter of 2.6 cm on image number 12 series 5. Lungs/Pleura: Progressive and more focal patchy opacities throughout both lungs. Most pronounced in the periphery of both lungs. Small right pleural effusion, with improvement. Minimal left pleural effusion, improved. Upper Abdomen: No acute abnormality. Musculoskeletal: Thoracic and lower cervical spine degenerative changes. Sternomanubrial degenerative changes. Review of the MIP images confirms the above findings. IMPRESSION: 1. No pulmonary emboli. 2. Progressive and more focal patchy opacities throughout both lungs, most pronounced in the periphery of both lungs. This is compatible with the clinical diagnosis of COVID-19. 3. Progressive mediastinal and bilateral hilar adenopathy, compatible with progressive lymphoma. 4. Small right pleural effusion and minimal left pleural effusion, improved. 5. Stable minimal enlargement of the main pulmonary artery, compatible with mild pulmonary arterial hypertension. 6. Calcific coronary artery and  aortic atherosclerosis. Aortic Atherosclerosis (ICD10-I70.0). Electronically Signed   By: Claudie Revering M.D.   On: 11/02/2018 23:06   Dg Chest Portable 1 View  Result Date:  11/10/2018 CLINICAL DATA:  Shortness of breath. History of urinary bladder carcinoma EXAM: PORTABLE CHEST 1 VIEW COMPARISON:  November 07, 2018 chest radiograph and chest CT January 06, 2018 FINDINGS: There is fibrotic type change in the mid and lower lung zones. No consolidation. Heart is upper normal in size with pulmonary vascularity normal. There is adenopathy in the hila and mediastinal regions, also present on prior CT. There is aortic atherosclerosis. Port-A-Cath tip is at the cavoatrial junction. IMPRESSION: Areas of fibrotic change in the mid and lower lung zones without edema or consolidation. Multifocal adenopathy of uncertain etiology. Neoplastic adenopathy must be of concern. Stable cardiac silhouette. Port-A-Cath tip at cavoatrial junction. Aortic Atherosclerosis (ICD10-I70.0). Electronically Signed   By: Lowella Grip III M.D.   On: 11/06/2018 14:47   Dialysis Orders:  Per prior admission c/s note:  Dialyzes at [place] (I suspect was reduced last admission).  TTS 3-1/2 hours HD Bath 2K/3 calcium, Dialyzer unknown, Heparin no. Access left aVF.  400/800 Hectorol 0.5 mics q. treatment and Venofer 50 weekly  Assessment/Plan **Hypoxia in setting of COVID 19 PNA:  CTA finding c/w evolution of COVID PNA, no E/o PE.  Solumedrol per primary.   **ESRD on HD TTS:  Plan for HD today per outpt schedule.  3K/2.5Ca, no heparin, trial UF 2L, standing post weight, midodrine before.   **Anemia of CKD:  Hb in 10s last admission (8/9) now 8s.  Initiate aranesp 60 qSat.  No IV iron in light of recent ferritin ~4000  **BMM:  Ca and phos Ok. Not on binder.  Cont hectorol.   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   **Malnutrition: albumin 2.2. Prostat BID as tolerated.   Justin Mend 11/16/2018, 6:36 AM

## 2018-11-17 LAB — COMPREHENSIVE METABOLIC PANEL
ALT: 18 U/L (ref 0–44)
AST: 22 U/L (ref 15–41)
Albumin: 2 g/dL — ABNORMAL LOW (ref 3.5–5.0)
Alkaline Phosphatase: 35 U/L — ABNORMAL LOW (ref 38–126)
Anion gap: 14 (ref 5–15)
BUN: 40 mg/dL — ABNORMAL HIGH (ref 8–23)
CO2: 24 mmol/L (ref 22–32)
Calcium: 7.1 mg/dL — ABNORMAL LOW (ref 8.9–10.3)
Chloride: 96 mmol/L — ABNORMAL LOW (ref 98–111)
Creatinine, Ser: 5.5 mg/dL — ABNORMAL HIGH (ref 0.61–1.24)
GFR calc Af Amer: 11 mL/min — ABNORMAL LOW (ref >60)
GFR calc non Af Amer: 9 mL/min — ABNORMAL LOW (ref >60)
Glucose, Bld: 153 mg/dL — ABNORMAL HIGH (ref 70–99)
Potassium: 4.3 mmol/L (ref 3.5–5.1)
Sodium: 134 mmol/L — ABNORMAL LOW (ref 135–145)
Total Bilirubin: 0.5 mg/dL (ref 0.3–1.2)
Total Protein: 5 g/dL — ABNORMAL LOW (ref 6.5–8.1)

## 2018-11-17 LAB — CBC
HCT: 22.2 % — ABNORMAL LOW (ref 39.0–52.0)
Hemoglobin: 7.5 g/dL — ABNORMAL LOW (ref 13.0–17.0)
MCH: 35 pg — ABNORMAL HIGH (ref 26.0–34.0)
MCHC: 33.8 g/dL (ref 30.0–36.0)
MCV: 103.7 fL — ABNORMAL HIGH (ref 80.0–100.0)
Platelets: 48 10*3/uL — ABNORMAL LOW (ref 150–400)
RBC: 2.14 MIL/uL — ABNORMAL LOW (ref 4.22–5.81)
RDW: 12.6 % (ref 11.5–15.5)
WBC: 0.4 10*3/uL — CL (ref 4.0–10.5)
nRBC: 0 % (ref 0.0–0.2)

## 2018-11-17 LAB — GLUCOSE, CAPILLARY
Glucose-Capillary: 150 mg/dL — ABNORMAL HIGH (ref 70–99)
Glucose-Capillary: 96 mg/dL (ref 70–99)
Glucose-Capillary: 78 mg/dL (ref 70–99)
Glucose-Capillary: 77 mg/dL (ref 70–99)
Glucose-Capillary: 42 mg/dL — CL (ref 70–99)
Glucose-Capillary: 67 mg/dL — ABNORMAL LOW (ref 70–99)

## 2018-11-17 LAB — D-DIMER, QUANTITATIVE: D-Dimer, Quant: 4.44 ug/mL-FEU — ABNORMAL HIGH (ref 0.00–0.50)

## 2018-11-17 LAB — FERRITIN: Ferritin: 1144 ng/mL — ABNORMAL HIGH (ref 24–336)

## 2018-11-17 LAB — C-REACTIVE PROTEIN: CRP: 31.6 mg/dL — ABNORMAL HIGH (ref <1.0)

## 2018-11-17 MED ORDER — MORPHINE SULFATE (PF) 2 MG/ML IV SOLN
INTRAVENOUS | Status: AC
  Administered 2018-11-17: 2 mg via INTRAVASCULAR
  Filled 2018-11-17: qty 1

## 2018-11-17 MED ORDER — DEXTROSE 50 % IV SOLN
INTRAVENOUS | Status: AC
  Administered 2018-11-17: 50 mL
  Filled 2018-11-17: qty 50

## 2018-11-17 MED ORDER — DEXTROSE 50 % IV SOLN
INTRAVENOUS | Status: AC
  Administered 2018-11-17: 20:00:00 50 mL
  Filled 2018-11-17: qty 50

## 2018-11-18 LAB — GLUCOSE, CAPILLARY: Glucose-Capillary: 24 mg/dL — CL (ref 70–99)

## 2018-11-20 LAB — CULTURE, BLOOD (ROUTINE X 2)
Culture: NO GROWTH
Special Requests: ADEQUATE

## 2018-11-26 ENCOUNTER — Ambulatory Visit: Payer: Medicare HMO | Admitting: Podiatry

## 2018-12-03 NOTE — Significant Event (Addendum)
[facility] Night Coverage  Patient with progressive hypoxia, now unresponsive. Documented DNI. Spoke to wife Mardene Celeste [phone]), does not want patient to suffer, does not want him to undergo CPR or intubation. States that he has been struggling and does not want patient to suffer any more. Would like to proceed with comfort measures. Given 2 mg Morphine for respiratory distress. Bedside RN updated on plan of care.   Addendum: At 2040 patient asystole, absent heart tones. Pronounced. Family notified. Death Certificate Completed.

## 2018-12-03 NOTE — Progress Notes (Signed)
Hypoglycemic Event  CBG: 52  Treatment: D50 50 mL (25 gm)  Symptoms: None  Follow-up CBG: Time:23:48 CBG Result:128  Possible Reasons for Event: Inadequate meal intake and Medication regimen: Levemir, SSI, inadequate po intake.  Comments/MD notified:Catalina Dewaine Oats, NP    Josue Hector [address]

## 2018-12-03 NOTE — Death Summary Note (Signed)
DEATH SUMMARY   Patient Details  Name: Mark [name]. MRN: [id number] DOB: 05/09/1945  Admission/Discharge Information   Admit Date:  December 09, 2018  Date of Death: Date of Death: 2018/12/11  Time of Death: Time of Death: July 18, 2038  Length of Stay: 2  Referring Physician: Albina Billet, MD   Reason(s) for Hospitalization  Acute on chronic hypoxemic respiratory failure secondary to COVID-19  Diagnoses  Preliminary cause of death:  Acute on chronic hypoxemic respiratory failure secondary to COVID-19 Secondary Diagnoses (including complications and co-morbidities):  Active Problems:   Pressure injury of skin   Shortness of breath Type 2 DM, poorly controlled ESRD on HD Stage IV SLL/CLL Pancytopenia Demand ischemia Depression  Brief Hospital Course (including significant findings, care, treatment, and services provided and events leading to death)  Mark [name]. is a 73 yo male with past medical history significant for SLL/CLL, ESRD on HD TTSa, anemia of chronic disease, thrombocytopenia who was diagnosed with COVID-19 on 7/24 and admitted to the hospital between 8/5 to 8/9 now presents with worsening shortness of breath.  During his previous admission, he was treated with Solu-Medrol and discharged home. He presented to the hospital again on 12-09-2022 with respiratory failure, tested positive for COVID-19, with elevated inflammatory markers. CTA chest revealed progressive a more focal patchy opacities throughout both lungs, most pronounced in the periphery of both lungs.  Compatible with clinical diagnosis of COVID-19. He was started on Solu-Medrol. He did not meet criteria for remdesivir. On 2022/12/11, he became more hypoxemic, became unresponsive. He had a do not intubate order. After speaking with wife, she decided to transition to comfort measures and did not wish to prolong his suffering. He passed away at 18-Jul-2038 on 12/11/2018   Pertinent Labs and Studies  Significant Diagnostic  Studies Ct Angio Chest Pe W/cm &/or Wo Cm  Result Date: December 09, 2018 CLINICAL DATA:  Fevers and shortness of breath. Recently diagnosed COVID-19. Small B-cell lymphoma. EXAM: CT ANGIOGRAPHY CHEST WITH CONTRAST TECHNIQUE: Multidetector CT imaging of the chest was performed using the standard protocol during bolus administration of intravenous contrast. Multiplanar CT image reconstructions and MIPs were obtained to evaluate the vascular anatomy. CONTRAST:  178mL OMNIPAQUE IOHEXOL 350 MG/ML SOLN COMPARISON:  01/06/2018. FINDINGS: Cardiovascular: Atheromatous calcifications, including the coronary arteries and aorta. Normal sized heart. The main pulmonary artery remains minimally enlarged with a transverse diameter of 3.0 cm on image number 232 series 7. No pulmonary artery oral filling defects. Mediastinum/Nodes: Multiple enlarged mediastinal and bilateral hilar lymph nodes with progression. A previously demonstrated 2.1 cm short axis subcarinal node has a short axis diameter of 3.3 cm on image number 86 series 5. A previously demonstrated 1.5 cm short axis prevascular node has a short axis diameter of 1.9 cm on image number 67 series 5. A previously demonstrated 2.3 cm short axis AP window lymph node has a short axis diameter of 3.2 cm on image number 67 series 5. Interval confluent adenopathy encasing the central bronchi bilaterally. Mildly prominent right axillary nodes without significant change. The largest has a short axis diameter of 1.3 cm on image number 50 series 5. Bilateral supraclavicular and lower neck adenopathy is again demonstrated. A previously demonstrated 2.6 cm short axis left supraclavicular node as a corresponding diameter of 2.6 cm on image number 12 series 5. Lungs/Pleura: Progressive and more focal patchy opacities throughout both lungs. Most pronounced in the periphery of both lungs. Small right pleural effusion, with improvement. Minimal left pleural effusion, improved. Upper Abdomen:  No  acute abnormality. Musculoskeletal: Thoracic and lower cervical spine degenerative changes. Sternomanubrial degenerative changes. Review of the MIP images confirms the above findings. IMPRESSION: 1. No pulmonary emboli. 2. Progressive and more focal patchy opacities throughout both lungs, most pronounced in the periphery of both lungs. This is compatible with the clinical diagnosis of COVID-19. 3. Progressive mediastinal and bilateral hilar adenopathy, compatible with progressive lymphoma. 4. Small right pleural effusion and minimal left pleural effusion, improved. 5. Stable minimal enlargement of the main pulmonary artery, compatible with mild pulmonary arterial hypertension. 6. Calcific coronary artery and aortic atherosclerosis. Aortic Atherosclerosis (ICD10-I70.0). Electronically Signed   By: Claudie Revering M.D.   On: 11/25/2018 23:06   Dg Chest Portable 1 View  Result Date: 11/09/2018 CLINICAL DATA:  Shortness of breath. History of urinary bladder carcinoma EXAM: PORTABLE CHEST 1 VIEW COMPARISON:  November 07, 2018 chest radiograph and chest CT January 06, 2018 FINDINGS: There is fibrotic type change in the mid and lower lung zones. No consolidation. Heart is upper normal in size with pulmonary vascularity normal. There is adenopathy in the hila and mediastinal regions, also present on prior CT. There is aortic atherosclerosis. Port-A-Cath tip is at the cavoatrial junction. IMPRESSION: Areas of fibrotic change in the mid and lower lung zones without edema or consolidation. Multifocal adenopathy of uncertain etiology. Neoplastic adenopathy must be of concern. Stable cardiac silhouette. Port-A-Cath tip at cavoatrial junction. Aortic Atherosclerosis (ICD10-I70.0). Electronically Signed   By: Lowella Grip III M.D.   On: 11/27/2018 14:47   Dg Chest Port 1 View  Result Date: 11/07/2018 CLINICAL DATA:  COVID-19 patient EXAM: PORTABLE CHEST 1 VIEW COMPARISON:  11/06/2018, 03/22/2018 FINDINGS: Right-sided central  venous port tip over the cavoatrial region. Stable heart size. No change in basilar airspace disease. No new foci of airspace disease. Bilateral hilar opacities, possible nodes. Slightly broadened appearance of the upper mediastinum. Aortic atherosclerosis. No pneumothorax. IMPRESSION: 1. Overall no significant change in bibasilar airspace disease as compared with 11/06/2018 2. Mildly prominent hilar opacities and borderline widening of the upper mediastinum, probable nodes. Electronically Signed   By: Donavan Foil M.D.   On: 11/07/2018 17:48   Dg Chest Portable 1 View  Result Date: 11/06/2018 CLINICAL DATA:  COVID-19 infection. Shortness of breath. EXAM: PORTABLE CHEST 1 VIEW COMPARISON:  Chest x-ray dated 03/22/2018 and chest CT dated 01/06/2018 FINDINGS: Power port in place, unchanged with the tip just above the cavoatrial junction in good position. The heart size and pulmonary vascularity are normal. Increased mediastinal adenopathy since the prior chest x-ray. New bibasilar linear atelectasis. No focal infiltrates or effusions. No acute bone abnormality. IMPRESSION: 1. Increased mediastinal adenopathy. 2. New bibasilar atelectasis. 3. Lungs otherwise clear. 4.  Aortic Atherosclerosis (ICD10-I70.0). Electronically Signed   By: [name] M.D.   On: 11/06/2018 16:49    Microbiology Recent Results (from the past 240 hour(s))  Culture, blood (Routine X 2) w Reflex to ID Panel     Status: None   Collection Time: 11/09/18  4:20 AM   Specimen: BLOOD  Result Value Ref Range Status   Specimen Description BLOOD PORTA CATH  Final   Special Requests   Final    BOTTLES DRAWN AEROBIC AND ANAEROBIC Blood Culture adequate volume   Culture   Final    NO GROWTH 5 DAYS Performed at [facility], [address] [place], [place] [address]    Report Status 11/14/2018 FINAL  Final  Culture, blood (Routine X 2) w Reflex to  ID Panel     Status: None   Collection Time: 11/09/18 10:37 AM   Specimen:  BLOOD  Result Value Ref Range Status   Specimen Description BLOOD SITE NOT SPECIFIED  Final   Special Requests   Final    BOTTLES DRAWN AEROBIC ONLY Blood Culture adequate volume   Culture   Final    NO GROWTH 5 DAYS Performed at [facility], [address], [place], [place] [address]    Report Status 11/14/2018 FINAL  Final  Culture, blood (routine x 2)     Status: None (Preliminary result)   Collection Time: 11/28/2018  1:17 PM   Specimen: BLOOD  Result Value Ref Range Status   Specimen Description BLOOD SITE NOT SPECIFIED  Final   Special Requests   Final    BOTTLES DRAWN AEROBIC AND ANAEROBIC Blood Culture adequate volume   Culture   Final    NO GROWTH 2 DAYS Performed at [facility], [place] [address] [place], [place] [address]    Report Status PENDING  Incomplete  SARS Coronavirus 2 [facility] order, Performed in [facility] hospital lab) Nasopharyngeal Nasopharyngeal Swab     Status: Abnormal   Collection Time: 11/11/2018  1:39 PM   Specimen: Nasopharyngeal Swab  Result Value Ref Range Status   SARS Coronavirus 2 POSITIVE (A) NEGATIVE Final    Comment: CRITICAL RESULT CALLED TO, READ BACK BY AND VERIFIED WITH: Benard Halsted, ED RN AT 1450 ON 11/28/2018 BY C. JESSUP, MT. (NOTE) If result is NEGATIVE SARS-CoV-2 target nucleic acids are NOT DETECTED. The SARS-CoV-2 RNA is generally detectable in upper and lower  respiratory specimens during the acute phase of infection. The lowest  concentration of SARS-CoV-2 viral copies this assay can detect is 250  copies / mL. A negative result does not preclude SARS-CoV-2 infection  and should not be used as the sole basis for treatment or other  patient management decisions.  A negative result may occur with  improper specimen collection / handling, submission of specimen other  than nasopharyngeal swab, presence of viral mutation(s) within the  areas targeted by this assay, and inadequate number of viral copies  (<250 copies / mL).  A negative result must be combined with clinical  observations, patient history, and epidemiological information. If result is POSITIVE SARS-CoV-2 target nucl eic acids are DETECTED. The SARS-CoV-2 RNA is generally detectable in upper and lower  respiratory specimens during the acute phase of infection.  Positive  results are indicative of active infection with SARS-CoV-2.  Clinical  correlation with patient history and other diagnostic information is  necessary to determine patient infection status.  Positive results do  not rule out bacterial infection or co-infection with other viruses. If result is PRESUMPTIVE POSTIVE SARS-CoV-2 nucleic acids MAY BE PRESENT.   A presumptive positive result was obtained on the submitted specimen  and confirmed on repeat testing.  While 2019 novel coronavirus  (SARS-CoV-2) nucleic acids may be present in the submitted sample  additional confirmatory testing may be necessary for epidemiological  and / or clinical management purposes  to differentiate between  SARS-CoV-2 and other Sarbecovirus currently known to infect humans.  If clinically indicated additional testing with an alternate test   methodology [phone]) is advised. The SARS-CoV-2 RNA is generally  detectable in upper and lower respiratory specimens during the acute  phase of infection. The expected result is Negative. Fact Sheet for Patients:  StrictlyIdeas.no Fact Sheet for Healthcare Providers: BankingDealers.co.za This test is  not yet approved or cleared by the [place] and has been authorized for detection and/or diagnosis of SARS-CoV-2 by FDA under an Emergency Use Authorization (EUA).  This EUA will remain in effect (meaning this test can be used) for the duration of the COVID-19 declaration under Section 564(b)(1) of the Act, 21 U.S.C. section 360bbb-3(b)(1), unless the authorization is terminated or revoked  sooner. Performed at [facility], [place] [address] [place], [place] [address]   MRSA PCR Screening     Status: None   Collection Time: 11/25/2018  9:33 PM   Specimen: Nasal Mucosa; Nasopharyngeal  Result Value Ref Range Status   MRSA by PCR NEGATIVE NEGATIVE Final    Comment:        The GeneXpert MRSA Assay (FDA approved for NASAL specimens only), is one component of a comprehensive MRSA colonization surveillance program. It is not intended to diagnose MRSA infection nor to guide or monitor treatment for MRSA infections. Performed at [facility], [place] [address] [place], [place] [address]     Lab Basic Metabolic Panel: Recent Labs  Lab 11/20/2018 1345 11/16/18 0325 12/06/2018 0401  NA 134* 131* 134*  K 3.4* 4.1 4.3  CL 95* 93* 96*  CO2 22 20* 24  GLUCOSE 291* 470* 153*  BUN 42* 58* 40*  CREATININE 6.71* 7.52* 5.50*  CALCIUM 7.3* 6.9* 7.1*   Liver Function Tests: Recent Labs  Lab 11/03/2018 1345 December 06, 2018 0401  AST 17 22  ALT 23 18  ALKPHOS 42 35*  BILITOT 0.8 0.5  PROT 5.5* 5.0*  ALBUMIN 2.2* 2.0*   No results for input(s): LIPASE, AMYLASE in the last 168 hours. No results for input(s): AMMONIA in the last 168 hours. CBC: Recent Labs  Lab 11/11/2018 1345 11/16/18 0325 12/06/2018 0401  WBC 2.3* 1.5* 0.4*  NEUTROABS 1.6*  --   --   HGB 8.0* 8.0* 7.5*  HCT 24.1* 24.8* 22.2*  MCV 104.3* 106.4* 103.7*  PLT 62* 57* 48*   Cardiac Enzymes: No results for input(s): CKTOTAL, CKMB, CKMBINDEX, TROPONINI in the last 168 hours. Sepsis Labs: Recent Labs  Lab 12/02/2018 1339 11/21/2018 1345 11/29/2018 1940 11/20/2018 1945 11/16/18 0325 06-Dec-2018 0401  PROCALCITON  --   --  6.25  --   --   --   WBC  --  2.3*  --   --  1.5* 0.4*  LATICACIDVEN 0.8  --   --  0.7  --   --      Dessa Phi 11/18/2018, 7:11 AM

## 2018-12-03 NOTE — Progress Notes (Signed)
Hypoglycemic Event  CBG: 51  Treatment: 8 oz juice/soda  Symptoms: None  Follow-up CBG: Time:21:49 CBG Result:52  Possible Reasons for Event: Inadequate meal intake and Medication regimen: Levemir, plus SSI, and decreased appetite.  Comments/MD notified:Catalina Dewaine Oats, NP    Josue Hector [address]

## 2018-12-03 NOTE — Progress Notes (Signed)
CRITICAL VALUE ALERT  Critical Value:  WBC 0.4  Date & Time Notied:  Dec 05, 2018 @ 05:30  Provider Notified: Alger Memos, NP  Orders Received/Actions taken: None

## 2018-12-03 NOTE — Progress Notes (Signed)
[facility] Progress Note   Subjective:   Hd yesterday, no UF with hypotension despite low temp and albumin.   Objective Vitals:   11-25-18 0400 Nov 25, 2018 0500 2018-11-25 0600 11-25-2018 0700  BP: (!) 127/56  (!) 124/54   Pulse: 95 100 99 (!) 108  Resp: 17 17 18 18   Temp: (!) 100.7 F (38.2 C)     TempSrc: Oral     SpO2: 97% 97% 98% 93%  Weight:      Height:       Physical Exam PE deferred in light of COVID mitigation, reviewed chart in detail.   Additional Objective Labs: Basic Metabolic Panel: Recent Labs  Lab 11/07/2018 1345 11/16/18 0325 11/25/2018 0401  NA 134* 131* 134*  K 3.4* 4.1 4.3  CL 95* 93* 96*  CO2 22 20* 24  GLUCOSE 291* 470* 153*  BUN 42* 58* 40*  CREATININE 6.71* 7.52* 5.50*  CALCIUM 7.3* 6.9* 7.1*   Liver Function Tests: Recent Labs  Lab 11/12/2018 1345 2018/11/25 0401  AST 17 22  ALT 23 18  ALKPHOS 42 35*  BILITOT 0.8 0.5  PROT 5.5* 5.0*  ALBUMIN 2.2* 2.0*   No results for input(s): LIPASE, AMYLASE in the last 168 hours. CBC: Recent Labs  Lab 11/04/2018 1345 11/16/18 0325 2018-11-25 0401  WBC 2.3* 1.5* 0.4*  NEUTROABS 1.6*  --   --   HGB 8.0* 8.0* 7.5*  HCT 24.1* 24.8* 22.2*  MCV 104.3* 106.4* 103.7*  PLT 62* 57* 48*   Blood Culture    Component Value Date/Time   SDES BLOOD SITE NOT SPECIFIED 11/02/2018 1317   SPECREQUEST  11/22/2018 1317    BOTTLES DRAWN AEROBIC AND ANAEROBIC Blood Culture adequate volume   CULT  11/21/2018 1317    NO GROWTH < 24 HOURS Performed at [facility] [address] [place], [place] [address]    REPTSTATUS PENDING 11/22/2018 1317    Cardiac Enzymes: No results for input(s): CKTOTAL, CKMB, CKMBINDEX, TROPONINI in the last 168 hours. CBG: Recent Labs  Lab 11/16/18 1708 11/16/18 2102 11/16/18 2149 11/16/18 2348 Nov 25, 2018 0415  GLUCAP 181* 51* 52* 128* 150*   Iron Studies:  Recent Labs    November 25, 2018 0401  FERRITIN 1,144*   @[email or web address] @ Studies/Results: Ct Angio Chest Pe  W/cm &/or Wo Cm  Result Date: 11/16/2018 CLINICAL DATA:  Fevers and shortness of breath. Recently diagnosed COVID-19. Small B-cell lymphoma. EXAM: CT ANGIOGRAPHY CHEST WITH CONTRAST TECHNIQUE: Multidetector CT imaging of the chest was performed using the standard protocol during bolus administration of intravenous contrast. Multiplanar CT image reconstructions and MIPs were obtained to evaluate the vascular anatomy. CONTRAST:  17mL OMNIPAQUE IOHEXOL 350 MG/ML SOLN COMPARISON:  01/06/2018. FINDINGS: Cardiovascular: Atheromatous calcifications, including the coronary arteries and aorta. Normal sized heart. The main pulmonary artery remains minimally enlarged with a transverse diameter of 3.0 cm on image number 232 series 7. No pulmonary artery oral filling defects. Mediastinum/Nodes: Multiple enlarged mediastinal and bilateral hilar lymph nodes with progression. A previously demonstrated 2.1 cm short axis subcarinal node has a short axis diameter of 3.3 cm on image number 86 series 5. A previously demonstrated 1.5 cm short axis prevascular node has a short axis diameter of 1.9 cm on image number 67 series 5. A previously demonstrated 2.3 cm short axis AP window lymph node has a short axis diameter of 3.2 cm on image number 67 series 5. Interval confluent adenopathy encasing the central bronchi bilaterally. Mildly prominent right axillary nodes without significant  change. The largest has a short axis diameter of 1.3 cm on image number 50 series 5. Bilateral supraclavicular and lower neck adenopathy is again demonstrated. A previously demonstrated 2.6 cm short axis left supraclavicular node as a corresponding diameter of 2.6 cm on image number 12 series 5. Lungs/Pleura: Progressive and more focal patchy opacities throughout both lungs. Most pronounced in the periphery of both lungs. Small right pleural effusion, with improvement. Minimal left pleural effusion, improved. Upper Abdomen: No acute abnormality.  Musculoskeletal: Thoracic and lower cervical spine degenerative changes. Sternomanubrial degenerative changes. Review of the MIP images confirms the above findings. IMPRESSION: 1. No pulmonary emboli. 2. Progressive and more focal patchy opacities throughout both lungs, most pronounced in the periphery of both lungs. This is compatible with the clinical diagnosis of COVID-19. 3. Progressive mediastinal and bilateral hilar adenopathy, compatible with progressive lymphoma. 4. Small right pleural effusion and minimal left pleural effusion, improved. 5. Stable minimal enlargement of the main pulmonary artery, compatible with mild pulmonary arterial hypertension. 6. Calcific coronary artery and aortic atherosclerosis. Aortic Atherosclerosis (ICD10-I70.0). Electronically Signed   By: Claudie Revering M.D.   On: 11/20/2018 23:06   Dg Chest Portable 1 View  Result Date: 11/20/2018 CLINICAL DATA:  Shortness of breath. History of urinary bladder carcinoma EXAM: PORTABLE CHEST 1 VIEW COMPARISON:  November 07, 2018 chest radiograph and chest CT January 06, 2018 FINDINGS: There is fibrotic type change in the mid and lower lung zones. No consolidation. Heart is upper normal in size with pulmonary vascularity normal. There is adenopathy in the hila and mediastinal regions, also present on prior CT. There is aortic atherosclerosis. Port-A-Cath tip is at the cavoatrial junction. IMPRESSION: Areas of fibrotic change in the mid and lower lung zones without edema or consolidation. Multifocal adenopathy of uncertain etiology. Neoplastic adenopathy must be of concern. Stable cardiac silhouette. Port-A-Cath tip at cavoatrial junction. Aortic Atherosclerosis (ICD10-I70.0). Electronically Signed   By: Lowella Grip III M.D.   On: 12/02/2018 14:47   Medications: . sodium chloride 10 mL/hr at 12-04-2018 0600   . acyclovir  200 mg Oral Q T,Th,Sat-1800  . AeroChamber Plus Flo-Vu Large  1 each Other Once  . albuterol  2 puff Inhalation Q6H   . amitriptyline  10 mg Oral QHS  . calcium acetate  1,334 mg Oral TID WC  . Chlorhexidine Gluconate Cloth  6 each Topical Q0600  . Chlorhexidine Gluconate Cloth  6 each Topical Q0600  . darbepoetin (ARANESP) injection - DIALYSIS  60 mcg Intravenous Q Sat-HD  . doxercalciferol  0.5 mcg Intravenous Q T,Th,Sa-HD  . feeding supplement (ENSURE ENLIVE)  237 mL Oral See admin instructions  . feeding supplement (PRO-STAT SUGAR FREE 64)  30 mL Oral BID  . gabapentin  100 mg Oral TID  . insulin aspart  0-20 Units Subcutaneous Q4H  . insulin glargine  30 Units Subcutaneous Daily  . mouth rinse  15 mL Mouth Rinse BID  . methylPREDNISolone (SOLU-MEDROL) injection  60 mg Intravenous Q6H  . metoCLOPramide  5 mg Oral QHS  . metoCLOPramide  5 mg Oral Once per day on Sun Mon Wed Fri  . midodrine  10 mg Oral Q T,Th,Sa-HD  . multivitamin  1 tablet Oral QHS  . polyethylene glycol  17 g Oral Daily  . pravastatin  10 mg Oral q1800  . sodium chloride flush  3 mL Intravenous Q12H    Dialysis Orders:  [facility]  [facility]TTS 3-1/2 hours HD Bath2K/3 calcium, Dialyzerunknown, Heparinno. Accessleft [id number].400/800 Hectorol 0.5 mics q. treatment and Venofer 50 weekly  Assessment/Plan **Hypoxia in setting of COVID 19 PNA:  CTA finding c/w evolution of COVID PNA, no E/o PE.  Solumedrol per primary.   **ESRD on HD TTS: HD Sat 8/15 per outpt schedule. Due to hypotension no UF was achieved.  Remains stable this AM, poor po intake likely helping with volume.  Daily wt and assessment - hopefully can go until next usual tx Tues prior to next HD.    **Anemia of CKD:  Hb in 10s last admission (8/9) now 8s.  Initiated aranesp 60 qSat.  No IV iron in light of recent ferritin ~4000  **BMM:  Ca and phos Ok. Not on binder.  Cont hectorol.   **DM: per primary.  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  Leukopenia on this AM labs (0.4).  **Malnutrition: albumin 2.2. Prostat BID as tolerated.   Jannifer Hick MD Nov 19, 2018, 8:21 AM  [facility] Pager: [phone]

## 2018-12-03 NOTE — Progress Notes (Signed)
Death Note: Pt was found to be restless and agonally breathing shortly after shift change; CBG noted to be 42, 1 amp D50 given, follow up CBG 67, repeated 1 amp D50. SpO2 was falling into the 60s. Elink notified and paged Hayden Pedro, NP with [facility] who then contacted family to change code status from partial code to full DNR. 2mg  Morphine given. Pt lost pulse at 20:40. Pronounced by my self and Delphia Grates, CN; no breath sounds or heart sounds auscultated. Family arrived at approximately 21:30, visited with deceased patient briefly, and only took cell phone and charger from patient's belongings bag; family then stated "we could trash the rest." CDS referral made. Patient to be prepped and transported to the morgue.

## 2018-12-03 NOTE — Progress Notes (Signed)
PROGRESS NOTE    Mark [name].  TFT:[id number] DOB: December 18, 1945 DOA: 11/18/2018 PCP: Albina Billet, MD     Brief Narrative:  Mark [name]. is a 73 yo male with past medical history significant for SLL/CLL, ESRD on HD TTSa, anemia of chronic disease, thrombocytopenia who was diagnosed with COVID-19 on 7/24 and mated to the hospital between 8/5 to 8/9 now presents with worsening shortness of breath.  During his previous admission, he was treated with Solu-Medrol.    New events last 24 hours / Subjective: Blood sugars low overnight, better this morning. States he has lost his taste and appetite. Up to 5L O2.  Complains of some burning pain behind his left shoulder blade.  States his right hand grip is weaker as well, but no pain in his right hand.  Assessment & Plan:   Active Problems:   Pressure injury of skin   Shortness of breath   Acute on chronic hypoxemic respiratory failure -CTA chest negative for PE -Requires 2 L nasal cannula at baseline -Continue to wean down as able/tolerated   COVID-19 -CTA chest revealed progressive a more focal patchy opacities throughout both lungs, most pronounced in the periphery of both lungs.  Compatible with clinical diagnosis of COVID-19  -Continue IV Solu-Medrol.  Discussed with Dr. Thereasa Solo and pharmacy 8/15, patient does not meet criteria for Remdesivir at this time. -Continue to trend inflammatory markers, trending down today   Type 2 diabetes, poorly controlled -Lantus, NovoLog sliding scale   ESRD -Dialysis Tuesday, Thursday, Saturday -Nephrology following  Stage IV SLL/CLL Pancytopenia -Previously on ibrutinib, now surveillance only due to poor tolerance -Follows with oncology, Dr. Rogue Bussing in [place]  Demand ischemia -Troponin 79, 63 in setting of COVID-19, ESRD  Depression -Continue Elavil   In agreement with assessment of the pressure ulcer as below:  Pressure Injury 11/27/2018 Coccyx Medial;Lower Stage II  -  Partial thickness loss of dermis presenting as a shallow open ulcer with a red, pink wound bed without slough. (Active)  11/26/2018 2130  Location: Coccyx  Location Orientation: Medial;Lower  Staging: Stage II -  Partial thickness loss of dermis presenting as a shallow open ulcer with a red, pink wound bed without slough.  Wound Description (Comments):   Present on Admission: Yes         DVT prophylaxis: SCD Code Status: Partial code, DNI Family Communication: Discussed with wife over the phone today  Disposition Plan: Pending improvement   Consultants:   None  Procedures:   None   Antimicrobials:  Anti-infectives (From admission, onward)   Start     Dose/Rate Route Frequency Ordered Stop   11/16/18 1800  acyclovir (ZOVIRAX) tablet 200 mg    Note to Pharmacy: Take 1/2 tablet (200 mg) by mouth on Tuesday, Thursday, Saturday nights (dialysis days)     200 mg Oral Every T-Th-Sa (1800) 12/01/2018 2131         Objective: Vitals:   2018/12/09 0600 12-09-2018 0700 2018/12/09 0906 12-09-18 1000  BP: (!) 124/54   (!) 114/45  Pulse: 99 (!) 108  63  Resp: 18 18  17   Temp:   99.2 F (37.3 C)   TempSrc:   Oral   SpO2: 98% 93%  99%  Weight:      Height:        Intake/Output Summary (Last 24 hours) at 12-09-2018 1032 Last data filed at 2018/12/09 0600 Gross per 24 hour  Intake 379.87 ml  Output 220 ml  Net  159.87 ml   Filed Weights   11/13/2018 2130 11/16/18 1315 11/16/18 1649  Weight: 82 kg 82.4 kg 82.1 kg    Examination: General exam: Appears calm and comfortable  Respiratory system: Clear to auscultation. Respiratory effort normal.  On 5 L nasal cannula O2.  No respiratory distress on examination. Cardiovascular system: S1 & S2 heard, tachycardic, regular rhythm. No JVD, murmurs, rubs, gallops or clicks. No pedal edema. Gastrointestinal system: Abdomen is nondistended, soft and nontender. No organomegaly or masses felt. Normal bowel sounds heard. Central nervous system:  Alert and oriented. No focal neurological deficits. Extremities: Symmetric, right hand grip weaker compared to left but right upper extremity remains strength 5 out of 5 Skin: No rashes, lesions or ulcers Psychiatry: Judgement and insight appear normal. Mood & affect appropriate.   Data Reviewed: I have personally reviewed following labs and imaging studies  CBC: Recent Labs  Lab 11/23/2018 1345 11/16/18 0325 11-19-2018 0401  WBC 2.3* 1.5* 0.4*  NEUTROABS 1.6*  --   --   HGB 8.0* 8.0* 7.5*  HCT 24.1* 24.8* 22.2*  MCV 104.3* 106.4* 103.7*  PLT 62* 57* 48*   Basic Metabolic Panel: Recent Labs  Lab 11/16/2018 1345 11/16/18 0325 2018/11/19 0401  NA 134* 131* 134*  K 3.4* 4.1 4.3  CL 95* 93* 96*  CO2 22 20* 24  GLUCOSE 291* 470* 153*  BUN 42* 58* 40*  CREATININE 6.71* 7.52* 5.50*  CALCIUM 7.3* 6.9* 7.1*   GFR: Estimated Creatinine Clearance: 12.7 mL/min (A) (by C-G formula based on SCr of 5.5 mg/dL (H)). Liver Function Tests: Recent Labs  Lab 11/25/2018 1345 2018-11-19 0401  AST 17 22  ALT 23 18  ALKPHOS 42 35*  BILITOT 0.8 0.5  PROT 5.5* 5.0*  ALBUMIN 2.2* 2.0*   No results for input(s): LIPASE, AMYLASE in the last 168 hours. No results for input(s): AMMONIA in the last 168 hours. Coagulation Profile: No results for input(s): INR, PROTIME in the last 168 hours. Cardiac Enzymes: No results for input(s): CKTOTAL, CKMB, CKMBINDEX, TROPONINI in the last 168 hours. BNP (last 3 results) No results for input(s): PROBNP in the last 8760 hours. HbA1C: No results for input(s): HGBA1C in the last 72 hours. CBG: Recent Labs  Lab 11/16/18 2102 11/16/18 2149 11/16/18 2348 Nov 19, 2018 0415 11/19/18 0854  GLUCAP 51* 52* 128* 150* 96   Lipid Profile: Recent Labs    11/16/2018 1940  TRIG 153*   Thyroid Function Tests: No results for input(s): TSH, T4TOTAL, FREET4, T3FREE, THYROIDAB in the last 72 hours. Anemia Panel: Recent Labs    11/16/18 0933 11/19/18 0401  FERRITIN  6,910* 1,144*   Sepsis Labs: Recent Labs  Lab 11/25/2018 1339 11/04/2018 1940 11/30/2018 1945  PROCALCITON  --  6.25  --   LATICACIDVEN 0.8  --  0.7    Recent Results (from the past 240 hour(s))  Culture, blood (Routine X 2) w Reflex to ID Panel     Status: None   Collection Time: 11/09/18  4:20 AM   Specimen: BLOOD  Result Value Ref Range Status   Specimen Description BLOOD PORTA CATH  Final   Special Requests   Final    BOTTLES DRAWN AEROBIC AND ANAEROBIC Blood Culture adequate volume   Culture   Final    NO GROWTH 5 DAYS Performed at [facility], [place] [address], [place], [place] [address]    Report Status 11/14/2018 FINAL  Final  Culture, blood (Routine X 2) w Reflex to ID Panel  Status: None   Collection Time: 11/09/18 10:37 AM   Specimen: BLOOD  Result Value Ref Range Status   Specimen Description BLOOD SITE NOT SPECIFIED  Final   Special Requests   Final    BOTTLES DRAWN AEROBIC ONLY Blood Culture adequate volume   Culture   Final    NO GROWTH 5 DAYS Performed at [facility], [address] [place], [place] [address]    Report Status 11/14/2018 FINAL  Final  Culture, blood (routine x 2)     Status: None (Preliminary result)   Collection Time: 11/22/2018  1:17 PM   Specimen: BLOOD  Result Value Ref Range Status   Specimen Description BLOOD SITE NOT SPECIFIED  Final   Special Requests   Final    BOTTLES DRAWN AEROBIC AND ANAEROBIC Blood Culture adequate volume   Culture   Final    NO GROWTH < 24 HOURS Performed at [facility], [place] [address], [place], [place] [address]    Report Status PENDING  Incomplete  SARS Coronavirus 2 [facility] order, Performed in [facility] hospital lab) Nasopharyngeal Nasopharyngeal Swab     Status: Abnormal   Collection Time: 11/30/2018  1:39 PM   Specimen: Nasopharyngeal Swab  Result Value Ref Range Status   SARS Coronavirus 2 POSITIVE (A) NEGATIVE Final    Comment: CRITICAL RESULT CALLED TO, READ BACK BY AND  VERIFIED WITH: Benard Halsted, ED RN AT 1450 ON 11/19/2018 BY C. JESSUP, MT. (NOTE) If result is NEGATIVE SARS-CoV-2 target nucleic acids are NOT DETECTED. The SARS-CoV-2 RNA is generally detectable in upper and lower  respiratory specimens during the acute phase of infection. The lowest  concentration of SARS-CoV-2 viral copies this assay can detect is 250  copies / mL. A negative result does not preclude SARS-CoV-2 infection  and should not be used as the sole basis for treatment or other  patient management decisions.  A negative result may occur with  improper specimen collection / handling, submission of specimen other  than nasopharyngeal swab, presence of viral mutation(s) within the  areas targeted by this assay, and inadequate number of viral copies  (<250 copies / mL). A negative result must be combined with clinical  observations, patient history, and epidemiological information. If result is POSITIVE SARS-CoV-2 target nucl eic acids are DETECTED. The SARS-CoV-2 RNA is generally detectable in upper and lower  respiratory specimens during the acute phase of infection.  Positive  results are indicative of active infection with SARS-CoV-2.  Clinical  correlation with patient history and other diagnostic information is  necessary to determine patient infection status.  Positive results do  not rule out bacterial infection or co-infection with other viruses. If result is PRESUMPTIVE POSTIVE SARS-CoV-2 nucleic acids MAY BE PRESENT.   A presumptive positive result was obtained on the submitted specimen  and confirmed on repeat testing.  While 2019 novel coronavirus  (SARS-CoV-2) nucleic acids may be present in the submitted sample  additional confirmatory testing may be necessary for epidemiological  and / or clinical management purposes  to differentiate between  SARS-CoV-2 and other Sarbecovirus currently known to infect humans.  If clinically indicated additional testing with an  alternate test   methodology [phone]) is advised. The SARS-CoV-2 RNA is generally  detectable in upper and lower respiratory specimens during the acute  phase of infection. The expected result is Negative. Fact Sheet for Patients:  StrictlyIdeas.no Fact Sheet for Healthcare Providers: BankingDealers.co.za This test is not yet approved or cleared  by the [place] and has been authorized for detection and/or diagnosis of SARS-CoV-2 by FDA under an Emergency Use Authorization (EUA).  This EUA will remain in effect (meaning this test can be used) for the duration of the COVID-19 declaration under Section 564(b)(1) of the Act, 21 U.S.C. section 360bbb-3(b)(1), unless the authorization is terminated or revoked sooner. Performed at [facility], [place] [address] [place], [place] [address]   MRSA PCR Screening     Status: None   Collection Time: 11/16/2018  9:33 PM   Specimen: Nasal Mucosa; Nasopharyngeal  Result Value Ref Range Status   MRSA by PCR NEGATIVE NEGATIVE Final    Comment:        The GeneXpert MRSA Assay (FDA approved for NASAL specimens only), is one component of a comprehensive MRSA colonization surveillance program. It is not intended to diagnose MRSA infection nor to guide or monitor treatment for MRSA infections. Performed at [facility], [place] [address] [place], [place] [address]       Radiology Studies: Ct Angio Chest Pe W/cm &/or Wo Cm  Result Date: 11/16/2018 CLINICAL DATA:  Fevers and shortness of breath. Recently diagnosed COVID-19. Small B-cell lymphoma. EXAM: CT ANGIOGRAPHY CHEST WITH CONTRAST TECHNIQUE: Multidetector CT imaging of the chest was performed using the standard protocol during bolus administration of intravenous contrast. Multiplanar CT image reconstructions and MIPs were obtained to evaluate the vascular anatomy. CONTRAST:  170mL OMNIPAQUE IOHEXOL 350 MG/ML SOLN COMPARISON:   01/06/2018. FINDINGS: Cardiovascular: Atheromatous calcifications, including the coronary arteries and aorta. Normal sized heart. The main pulmonary artery remains minimally enlarged with a transverse diameter of 3.0 cm on image number 232 series 7. No pulmonary artery oral filling defects. Mediastinum/Nodes: Multiple enlarged mediastinal and bilateral hilar lymph nodes with progression. A previously demonstrated 2.1 cm short axis subcarinal node has a short axis diameter of 3.3 cm on image number 86 series 5. A previously demonstrated 1.5 cm short axis prevascular node has a short axis diameter of 1.9 cm on image number 67 series 5. A previously demonstrated 2.3 cm short axis AP window lymph node has a short axis diameter of 3.2 cm on image number 67 series 5. Interval confluent adenopathy encasing the central bronchi bilaterally. Mildly prominent right axillary nodes without significant change. The largest has a short axis diameter of 1.3 cm on image number 50 series 5. Bilateral supraclavicular and lower neck adenopathy is again demonstrated. A previously demonstrated 2.6 cm short axis left supraclavicular node as a corresponding diameter of 2.6 cm on image number 12 series 5. Lungs/Pleura: Progressive and more focal patchy opacities throughout both lungs. Most pronounced in the periphery of both lungs. Small right pleural effusion, with improvement. Minimal left pleural effusion, improved. Upper Abdomen: No acute abnormality. Musculoskeletal: Thoracic and lower cervical spine degenerative changes. Sternomanubrial degenerative changes. Review of the MIP images confirms the above findings. IMPRESSION: 1. No pulmonary emboli. 2. Progressive and more focal patchy opacities throughout both lungs, most pronounced in the periphery of both lungs. This is compatible with the clinical diagnosis of COVID-19. 3. Progressive mediastinal and bilateral hilar adenopathy, compatible with progressive lymphoma. 4. Small right  pleural effusion and minimal left pleural effusion, improved. 5. Stable minimal enlargement of the main pulmonary artery, compatible with mild pulmonary arterial hypertension. 6. Calcific coronary artery and aortic atherosclerosis. Aortic Atherosclerosis (ICD10-I70.0). Electronically Signed   By: Claudie Revering M.D.   On: 11/18/2018 23:06   Dg Chest Portable 1 View  Result  Date: 11/09/2018 CLINICAL DATA:  Shortness of breath. History of urinary bladder carcinoma EXAM: PORTABLE CHEST 1 VIEW COMPARISON:  November 07, 2018 chest radiograph and chest CT January 06, 2018 FINDINGS: There is fibrotic type change in the mid and lower lung zones. No consolidation. Heart is upper normal in size with pulmonary vascularity normal. There is adenopathy in the hila and mediastinal regions, also present on prior CT. There is aortic atherosclerosis. Port-A-Cath tip is at the cavoatrial junction. IMPRESSION: Areas of fibrotic change in the mid and lower lung zones without edema or consolidation. Multifocal adenopathy of uncertain etiology. Neoplastic adenopathy must be of concern. Stable cardiac silhouette. Port-A-Cath tip at cavoatrial junction. Aortic Atherosclerosis (ICD10-I70.0). Electronically Signed   By: Lowella Grip III M.D.   On: 11/09/2018 14:47      Scheduled Meds: . acyclovir  200 mg Oral Q T,Th,Sat-1800  . AeroChamber Plus Flo-Vu Large  1 each Other Once  . albuterol  2 puff Inhalation Q6H  . amitriptyline  10 mg Oral QHS  . calcium acetate  1,334 mg Oral TID WC  . Chlorhexidine Gluconate Cloth  6 each Topical Q0600  . Chlorhexidine Gluconate Cloth  6 each Topical Q0600  . darbepoetin (ARANESP) injection - DIALYSIS  60 mcg Intravenous Q Sat-HD  . doxercalciferol  0.5 mcg Intravenous Q T,Th,Sa-HD  . feeding supplement (ENSURE ENLIVE)  237 mL Oral See admin instructions  . feeding supplement (PRO-STAT SUGAR FREE 64)  30 mL Oral BID  . gabapentin  100 mg Oral TID  . insulin aspart  0-20 Units  Subcutaneous Q4H  . insulin glargine  30 Units Subcutaneous Daily  . mouth rinse  15 mL Mouth Rinse BID  . methylPREDNISolone (SOLU-MEDROL) injection  60 mg Intravenous Q6H  . metoCLOPramide  5 mg Oral QHS  . metoCLOPramide  5 mg Oral Once per day on Sun Mon Wed Fri  . midodrine  10 mg Oral Q T,Th,Sa-HD  . multivitamin  1 tablet Oral QHS  . polyethylene glycol  17 g Oral Daily  . pravastatin  10 mg Oral q1800  . sodium chloride flush  3 mL Intravenous Q12H   Continuous Infusions: . sodium chloride 10 mL/hr at 2018/12/07 0600     LOS: 2 days      Time spent: 35 minutes   Dessa Phi, DO [facility] [email or web address] 07-Dec-2018, 10:32 AM

## 2018-12-03 DEATH — deceased

## 2018-12-17 ENCOUNTER — Ambulatory Visit: Payer: Medicare HMO | Admitting: Podiatry

## 2019-01-21 ENCOUNTER — Ambulatory Visit: Payer: Medicare HMO | Admitting: Internal Medicine

## 2019-01-21 ENCOUNTER — Other Ambulatory Visit: Payer: Medicare HMO

## 2019-01-28 ENCOUNTER — Ambulatory Visit (INDEPENDENT_AMBULATORY_CARE_PROVIDER_SITE_OTHER): Payer: Medicare HMO | Admitting: Vascular Surgery

## 2019-01-28 ENCOUNTER — Encounter (INDEPENDENT_AMBULATORY_CARE_PROVIDER_SITE_OTHER): Payer: Medicare HMO

## 2019-02-04 ENCOUNTER — Other Ambulatory Visit: Payer: Medicare HMO | Admitting: Urology

## 2019-12-13 IMAGING — DX DG CHEST 1V PORT
1 series · 1 of 1 positions shown · non-contrast
Comparison: Radiograph December 15, 2017.

CLINICAL DATA: Shortness of breath.

EXAM:
PORTABLE CHEST 1 VIEW

[chest ap]
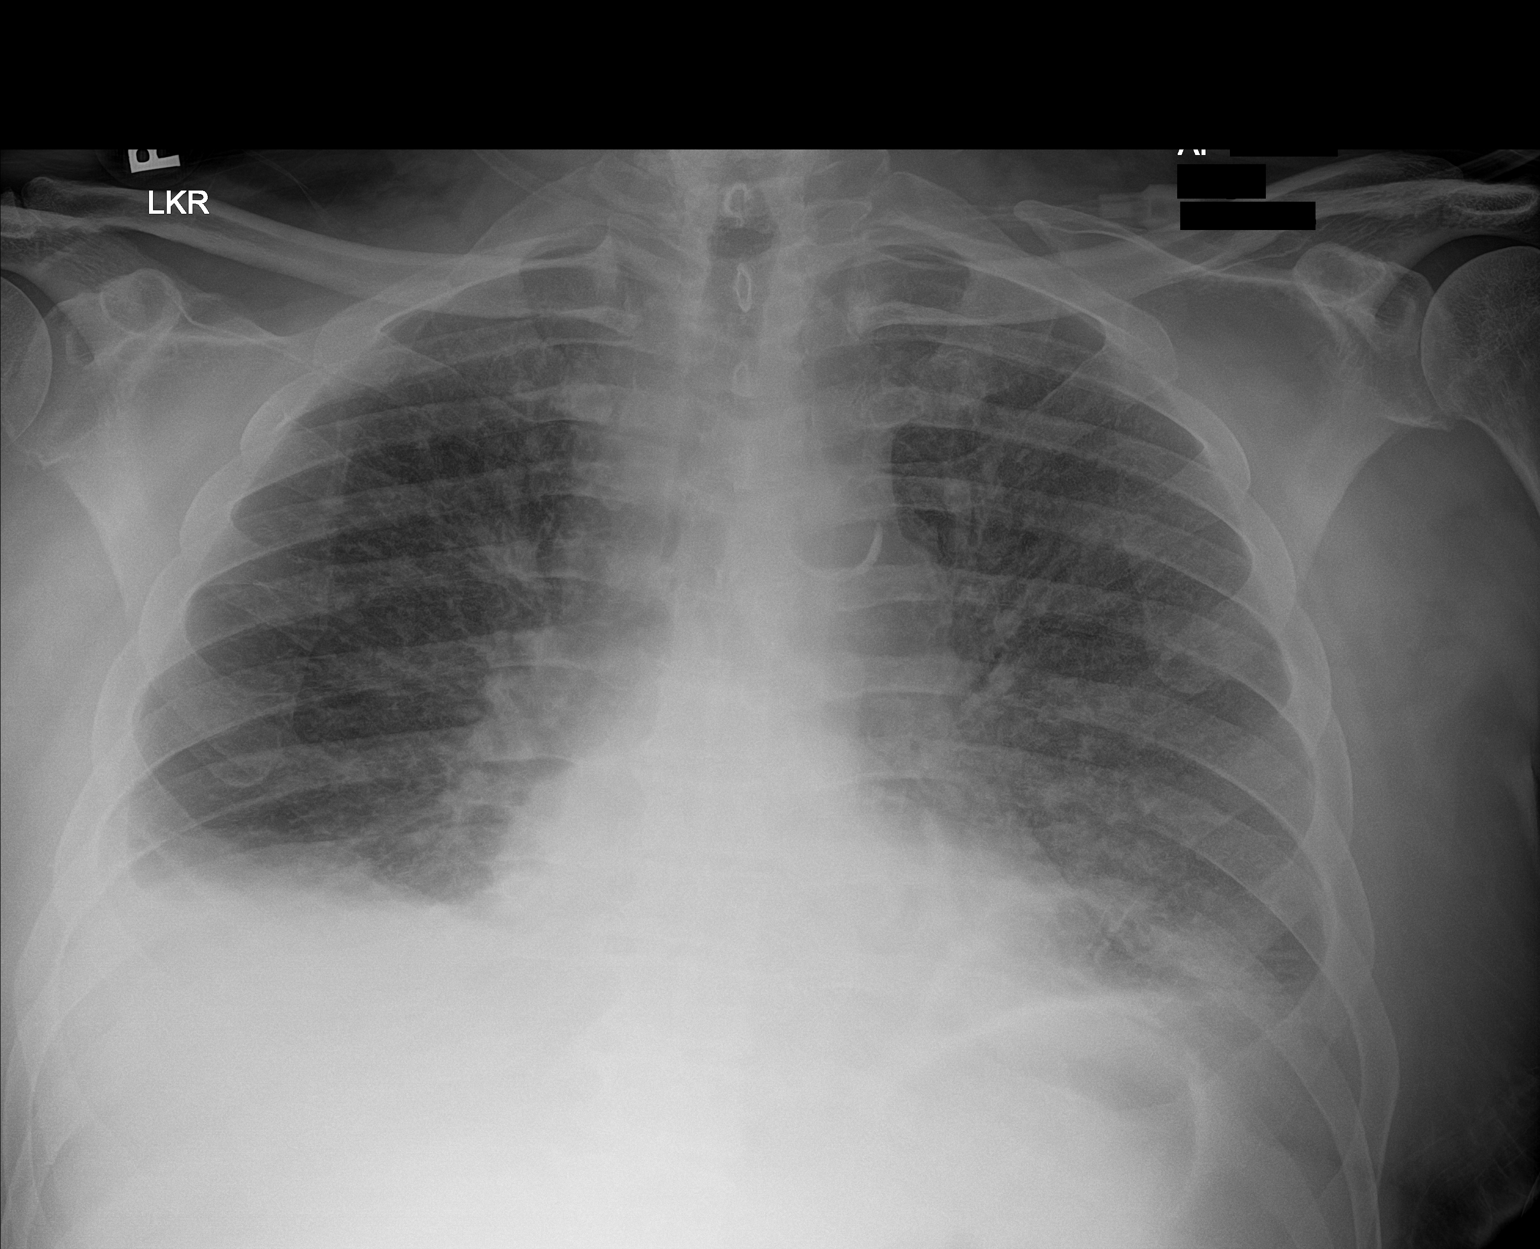

[1 of 1 positions shown; findings below may reference images not displayed]

FINDINGS: The heart size and mediastinal contours are within normal limits.
Stable bilateral pulmonary edema is noted. Mild bilateral pleural
effusions are noted which are increased compared to prior exam. No
pneumothorax is noted. Atherosclerosis of thoracic aorta is noted..
The visualized skeletal structures are unremarkable.
IMPRESSION: Bilateral pulmonary edema with mild bilateral pleural effusions.

Aortic Atherosclerosis ([id number]-[id number]).

## 2019-12-19 IMAGING — CR DG CHEST 2V
2 series · 2 of 2 positions shown · non-contrast
Comparison: 12/18/2017 and 12/15/2017

CLINICAL DATA: Acute onset shortness of breath.

EXAM:
CHEST - 2 VIEW

[chest lat]
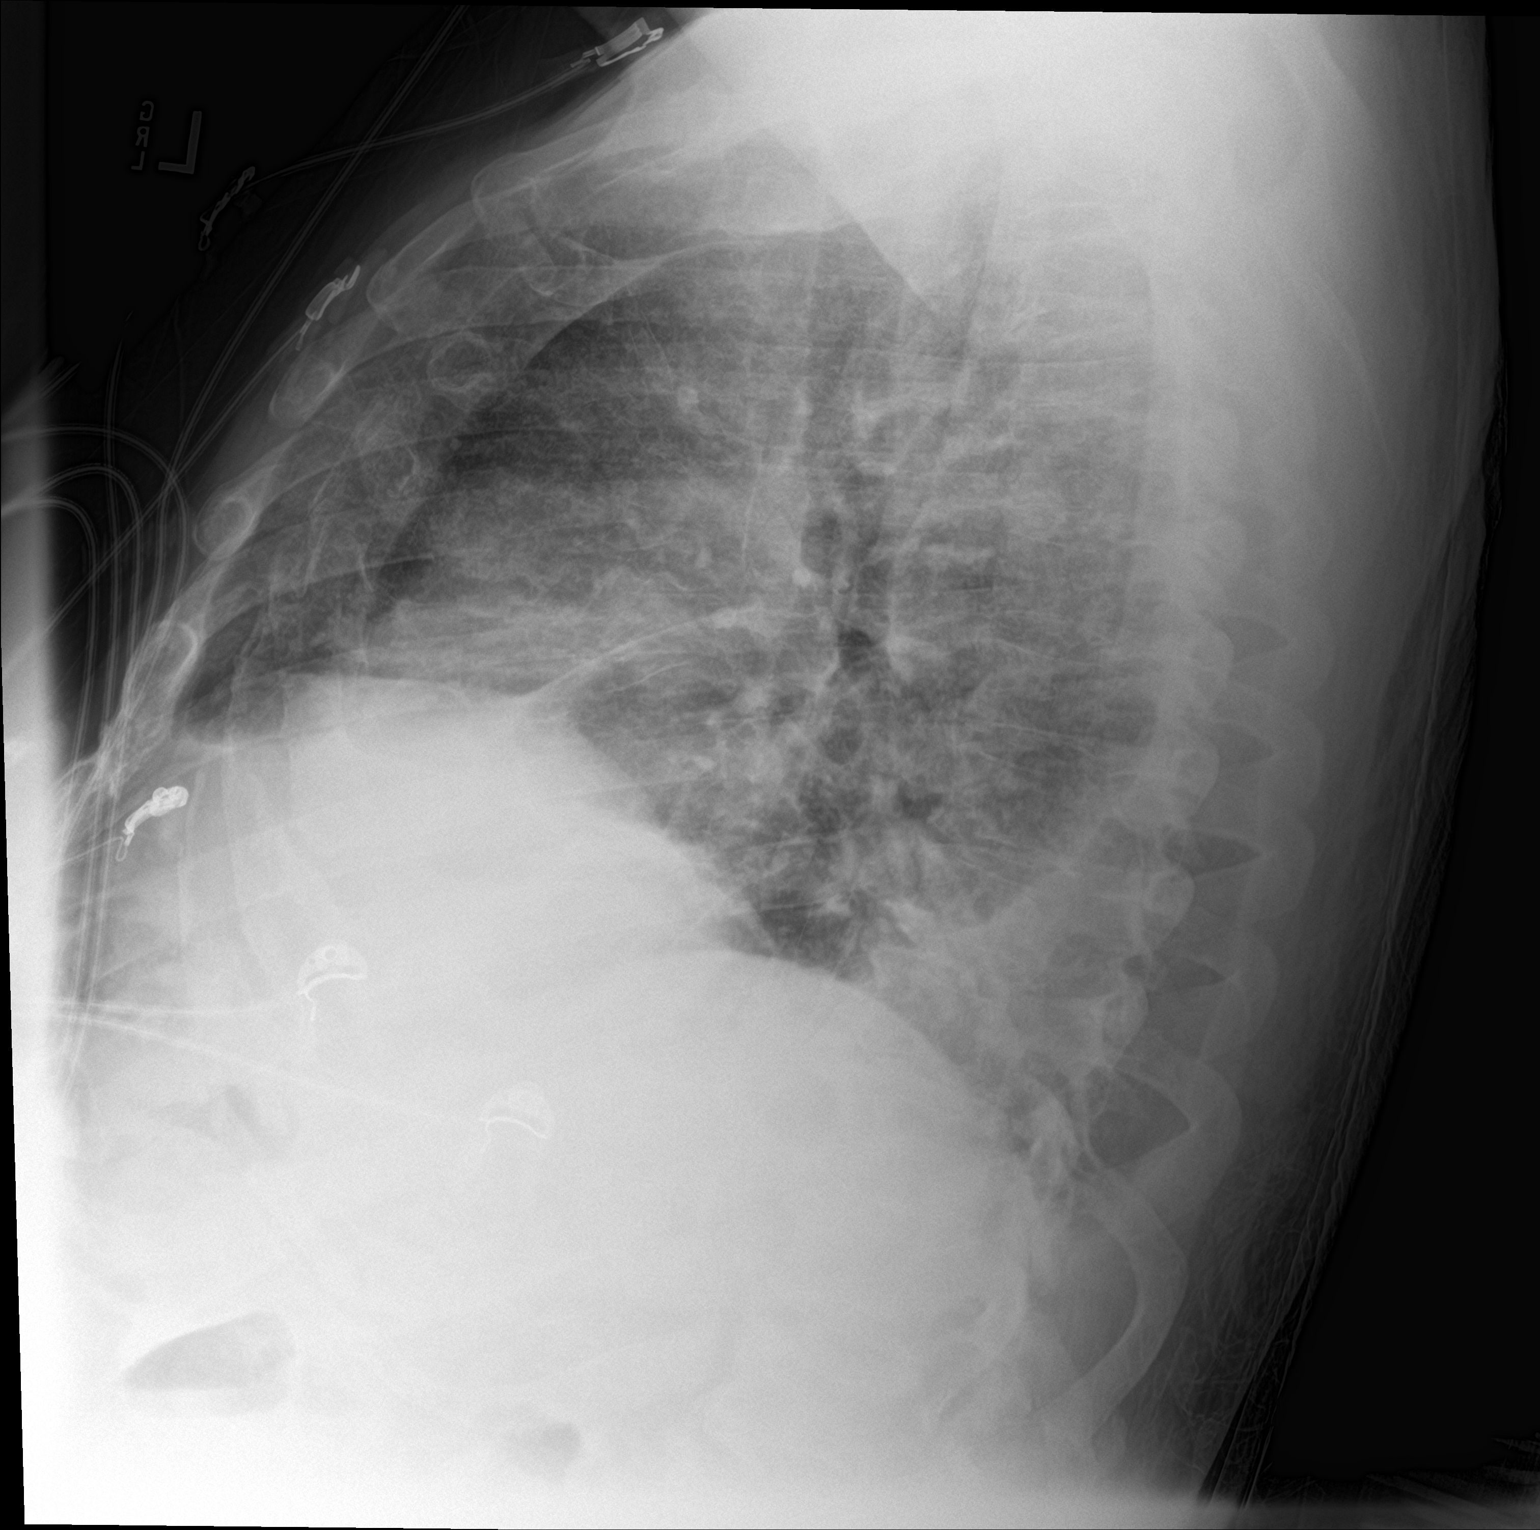

[chest ap]
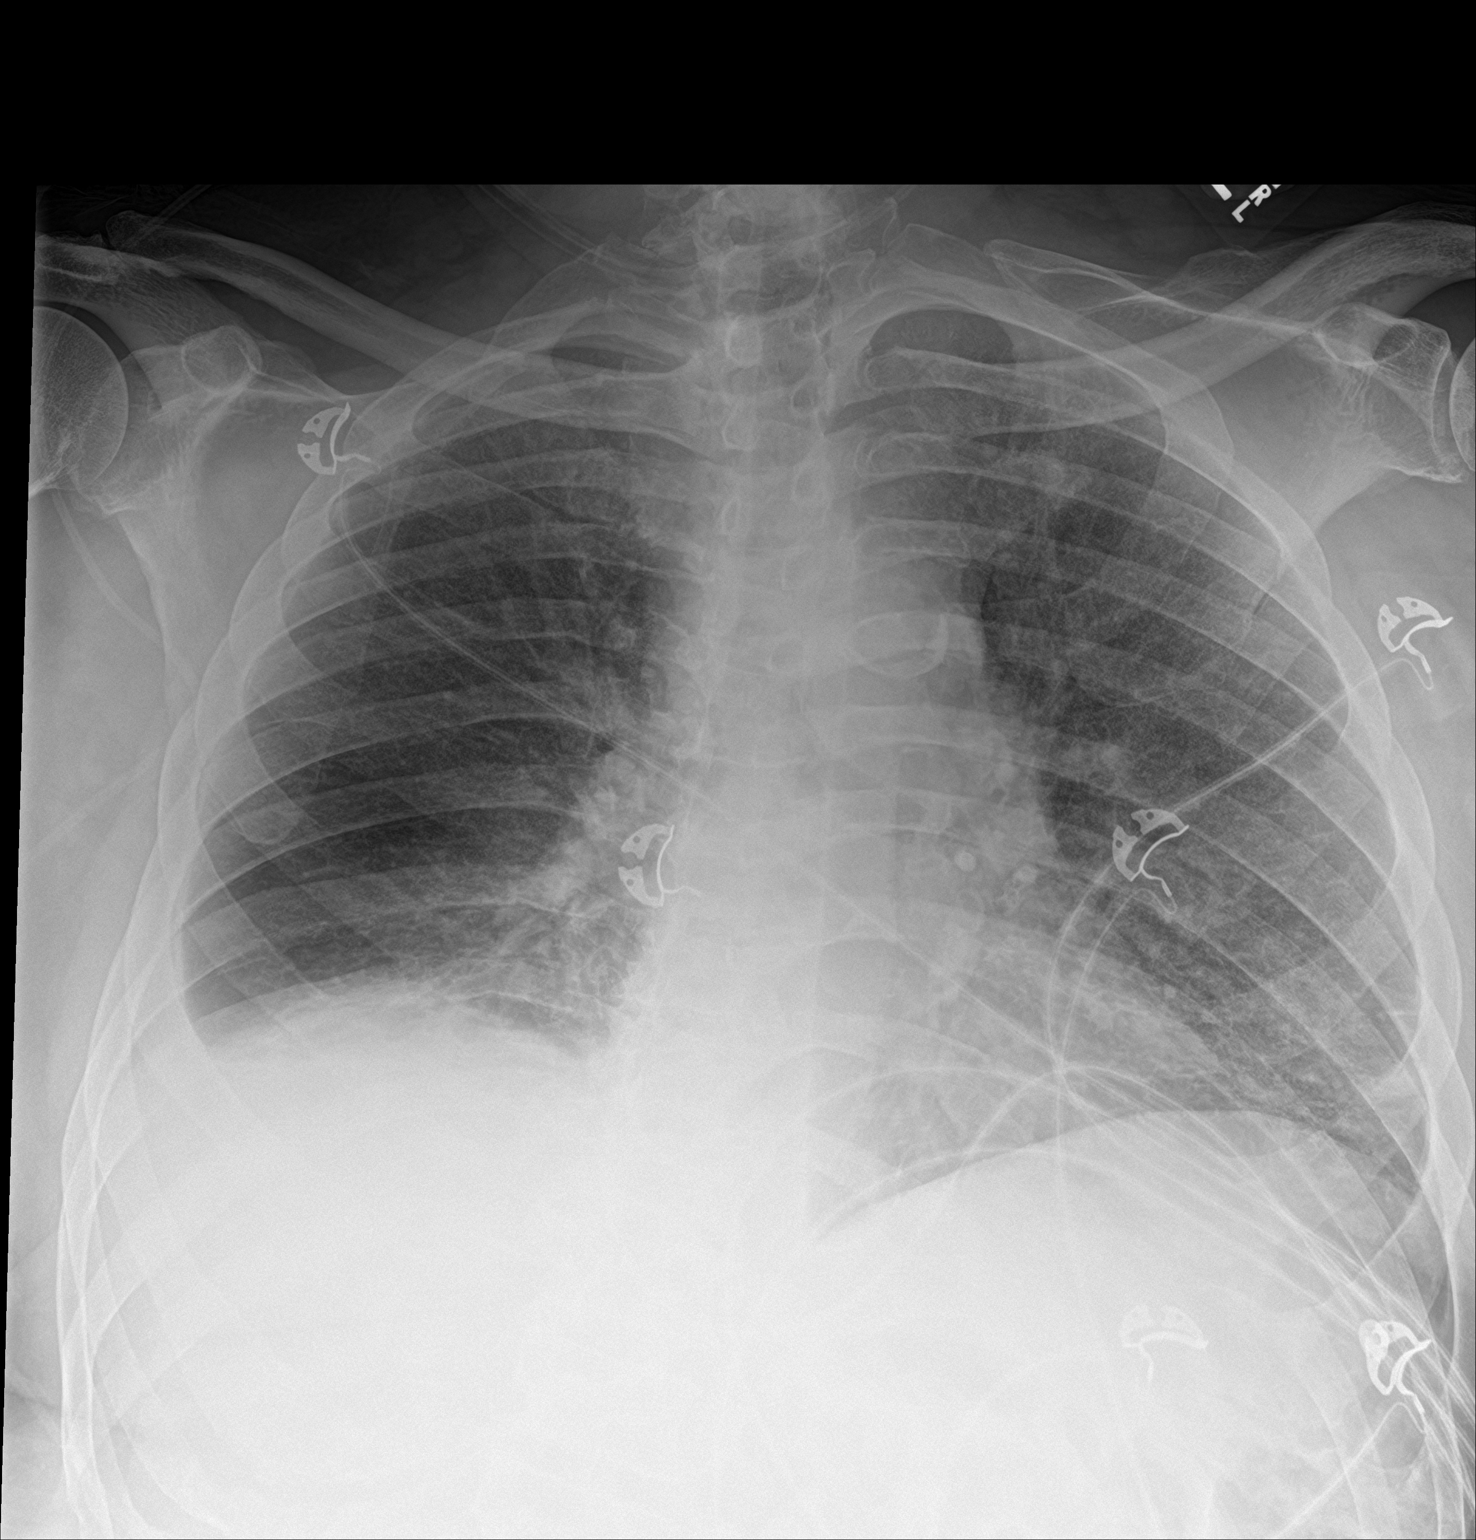

[2 of 2 positions shown; findings below may reference images not displayed]

FINDINGS: Lungs are adequately inflated demonstrate a small right-sided
pleural effusion likely with associated right basilar atelectasis
unchanged. Interval improved left base opacification.
Cardiomediastinal silhouette and remainder of the exam is unchanged.
IMPRESSION: Stable small right pleural effusion likely with associated right
basilar atelectasis. Near resolution of left base opacification.

## 2019-12-30 IMAGING — DX DG CHEST 1V PORT
1 series · 1 of 1 positions shown · non-contrast
Comparison: 12/30/2017

CLINICAL DATA: Short of breath on exertion

EXAM:
PORTABLE CHEST 1 VIEW

[chest ap]
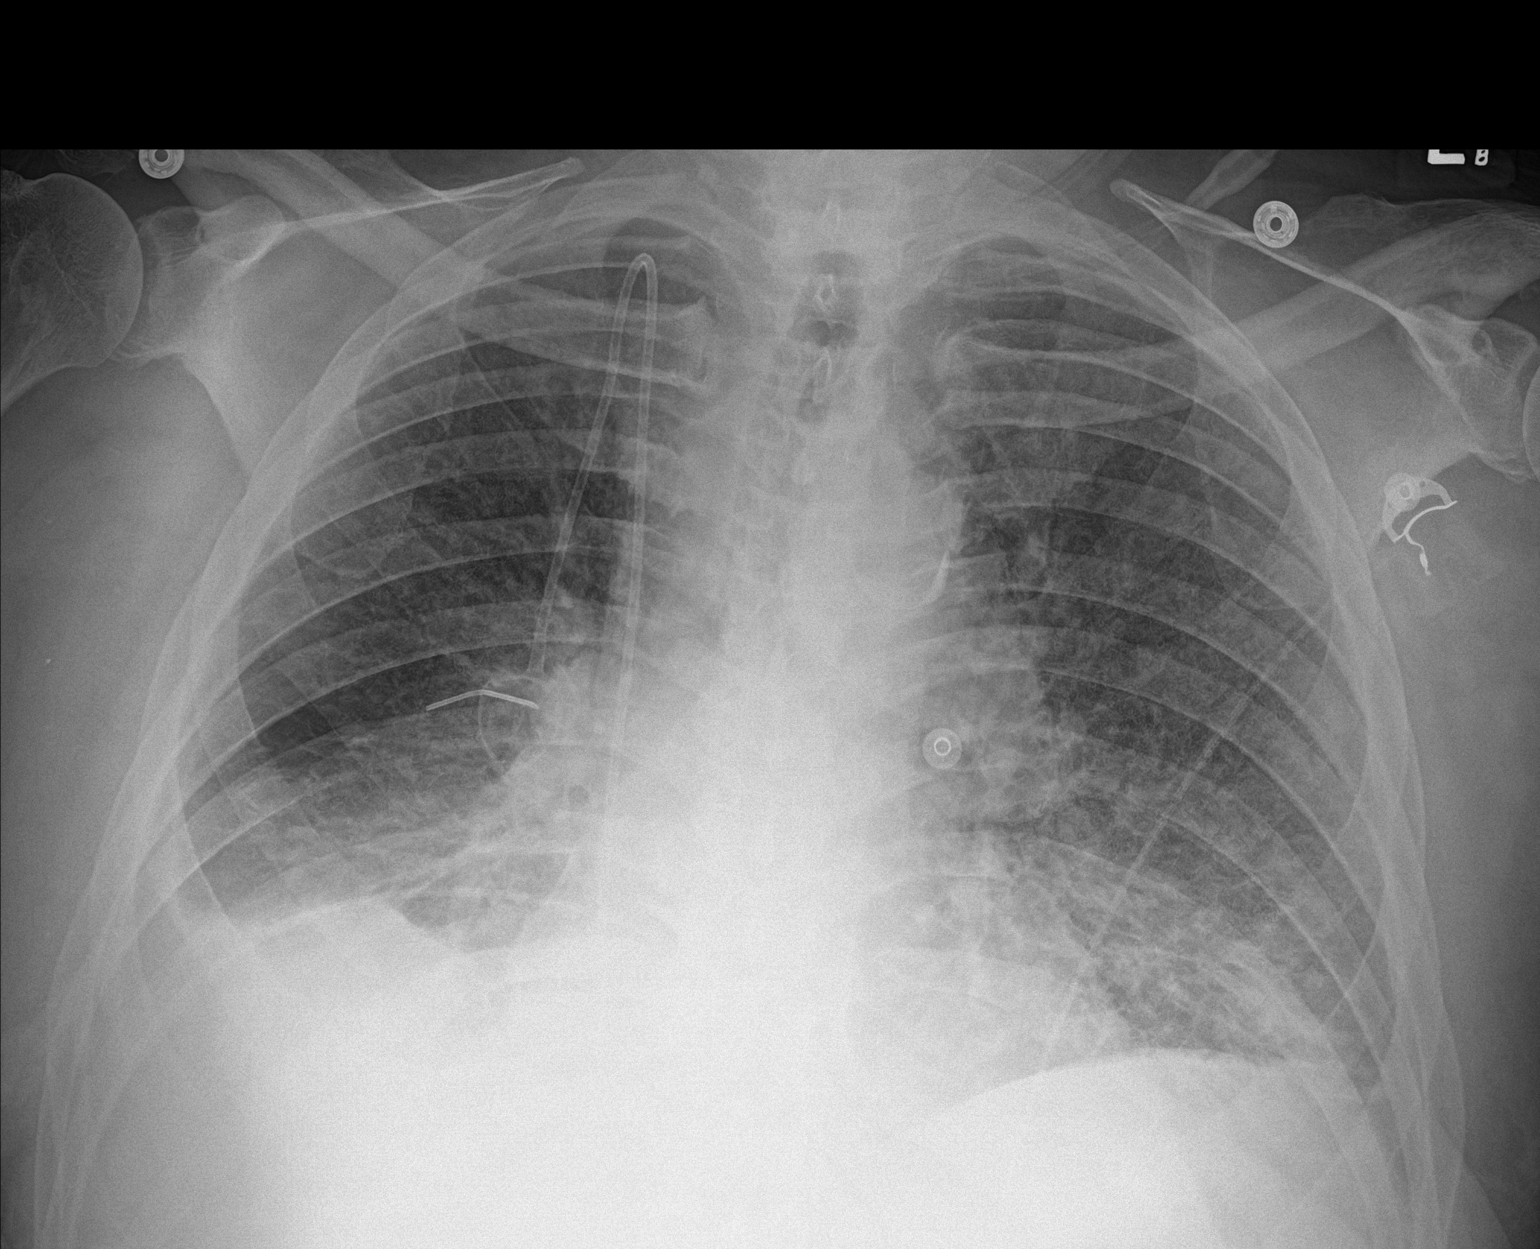

[1 of 1 positions shown; findings below may reference images not displayed]

FINDINGS: Progressive bilateral airspace disease with basilar predominance.
Progression of small right effusion.

Interval placement of Port-A-Cath with the tip in the right atrium.
No pneumothorax

Port-A-Cath tip in the right atrium

Progressive bilateral airspace disease and small right effusion.
IMPRESSION: No active disease.

## 2019-12-31 IMAGING — DX DG CHEST 1V PORT
1 series · 1 of 1 positions shown · non-contrast
Comparison: 01/04/2018

CLINICAL DATA: Shortness of breath, respiratory failure

EXAM:
PORTABLE CHEST 1 VIEW

[chest ap]
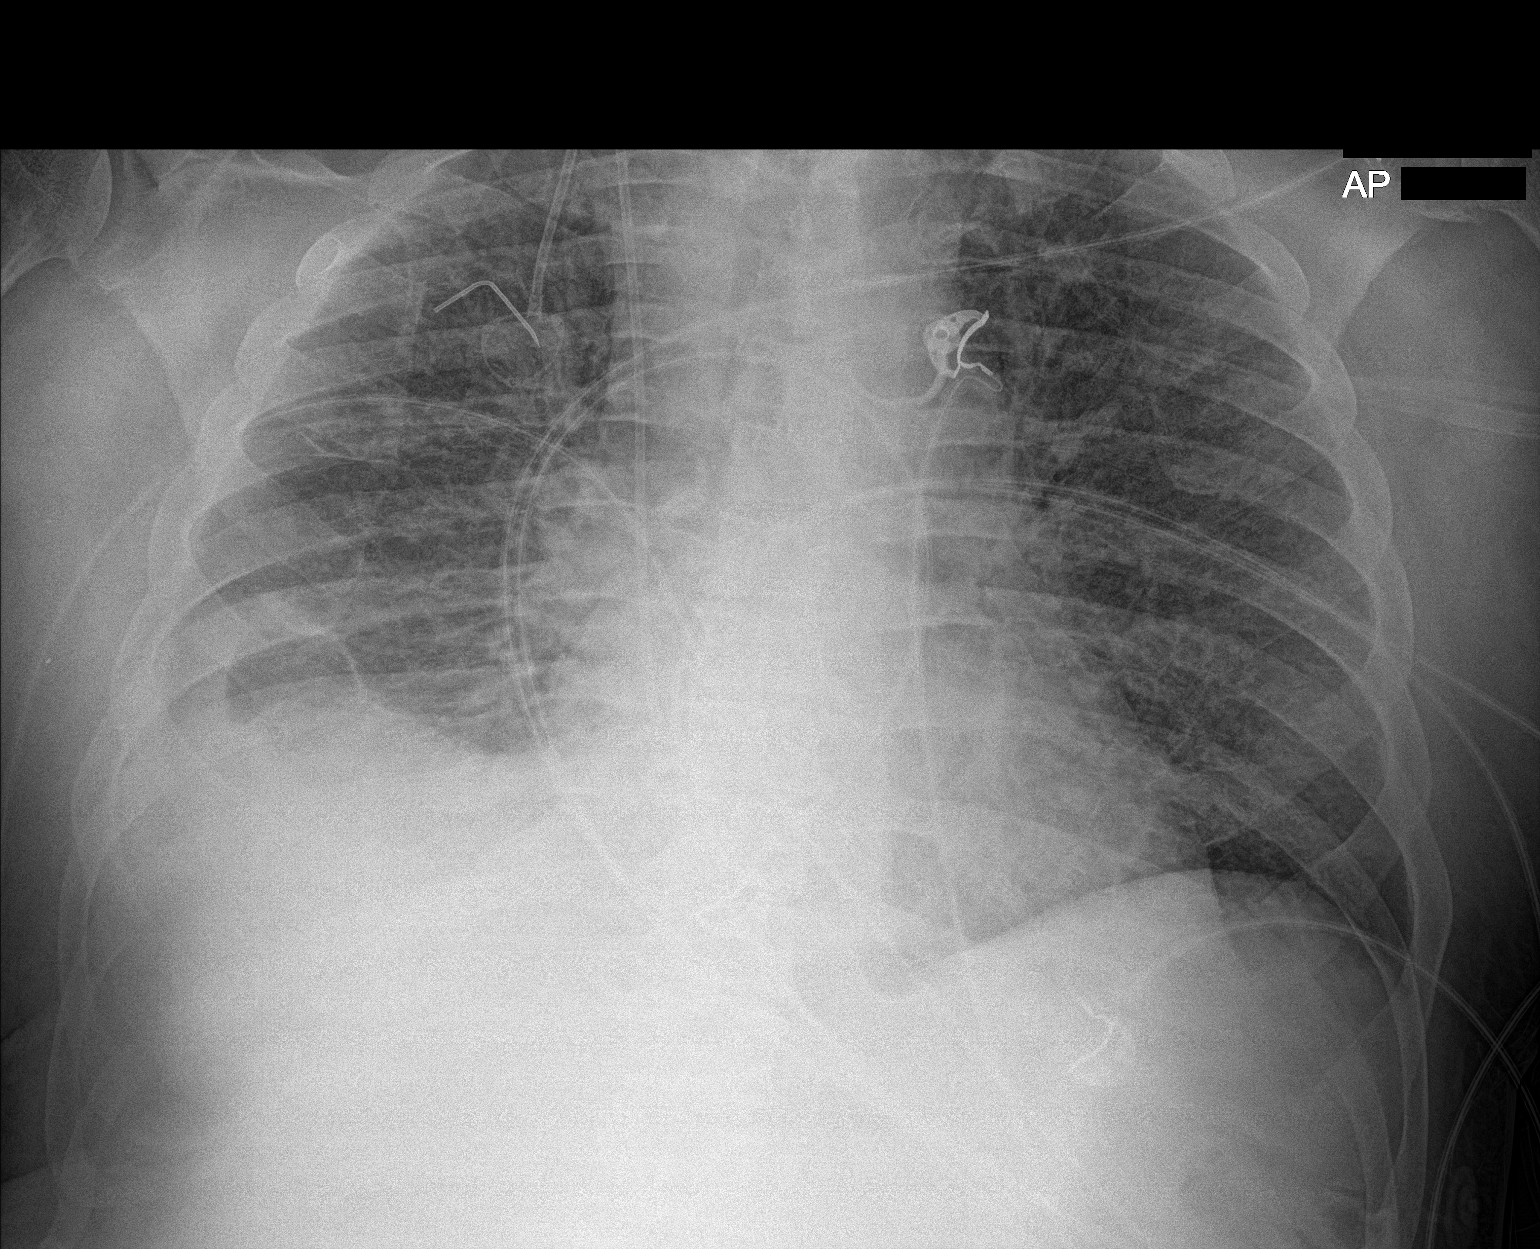

[1 of 1 positions shown; findings below may reference images not displayed]

FINDINGS: Right Port-A-Cath remains in place, unchanged. Diffuse bilateral
airspace opacities and small right effusion, stable since prior
study. Heart is borderline in size.
IMPRESSION: Stable diffuse bilateral airspace opacities and small right
effusion.
